# Patient Record
Sex: Male | Born: 1980 | Race: White | Hispanic: No | Marital: Single | Smoking: Never smoker
Health system: Southern US, Community
[De-identification: ages and names within clinical notes are randomized; demographics above are authoritative.]

## PROBLEM LIST (undated history)

## (undated) DIAGNOSIS — N319 Neuromuscular dysfunction of bladder, unspecified: Secondary | ICD-10-CM

## (undated) DIAGNOSIS — Z932 Ileostomy status: Secondary | ICD-10-CM

## (undated) DIAGNOSIS — G4730 Sleep apnea, unspecified: Secondary | ICD-10-CM

## (undated) DIAGNOSIS — G43909 Migraine, unspecified, not intractable, without status migrainosus: Secondary | ICD-10-CM

## (undated) DIAGNOSIS — K592 Neurogenic bowel, not elsewhere classified: Secondary | ICD-10-CM

## (undated) DIAGNOSIS — Z87898 Personal history of other specified conditions: Secondary | ICD-10-CM

## (undated) DIAGNOSIS — I10 Essential (primary) hypertension: Secondary | ICD-10-CM

## (undated) DIAGNOSIS — Z993 Dependence on wheelchair: Secondary | ICD-10-CM

## (undated) DIAGNOSIS — Q0700 Arnold-Chiari syndrome without spina bifida or hydrocephalus: Secondary | ICD-10-CM

## (undated) DIAGNOSIS — K219 Gastro-esophageal reflux disease without esophagitis: Secondary | ICD-10-CM

## (undated) DIAGNOSIS — Z973 Presence of spectacles and contact lenses: Secondary | ICD-10-CM

## (undated) DIAGNOSIS — Z982 Presence of cerebrospinal fluid drainage device: Secondary | ICD-10-CM

## (undated) DIAGNOSIS — Q052 Lumbar spina bifida with hydrocephalus: Secondary | ICD-10-CM

## (undated) DIAGNOSIS — R002 Palpitations: Secondary | ICD-10-CM

## (undated) DIAGNOSIS — R7303 Prediabetes: Secondary | ICD-10-CM

## (undated) DIAGNOSIS — G808 Other cerebral palsy: Secondary | ICD-10-CM

## (undated) DIAGNOSIS — Z87442 Personal history of urinary calculi: Secondary | ICD-10-CM

## (undated) DIAGNOSIS — N3945 Continuous leakage: Secondary | ICD-10-CM

## (undated) HISTORY — PX: OTHER SURGICAL HISTORY: SHX169

## (undated) HISTORY — PX: ORCHIOPEXY: SUR915

## (undated) HISTORY — PX: HIP SURGERY: SHX245

## (undated) HISTORY — PX: UMBILICAL HERNIA REPAIR: SHX196

## (undated) HISTORY — PX: VENTRICULOPERITONEAL SHUNT: SHX204

## (undated) HISTORY — PX: BACK SURGERY: SHX140

---
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
## 2006-02-28 ENCOUNTER — Encounter: Admission: RE | Admit: 2006-02-28 | Discharge: 2006-03-01 | Payer: Self-pay | Admitting: Family Medicine

## 2006-04-14 ENCOUNTER — Observation Stay (HOSPITAL_COMMUNITY): Admission: EM | Admit: 2006-04-14 | Discharge: 2006-04-15 | Payer: Self-pay | Admitting: Emergency Medicine

## 2006-11-24 ENCOUNTER — Inpatient Hospital Stay (HOSPITAL_COMMUNITY): Admission: EM | Admit: 2006-11-24 | Discharge: 2006-11-28 | Payer: Self-pay | Admitting: Emergency Medicine

## 2007-03-30 ENCOUNTER — Ambulatory Visit (HOSPITAL_COMMUNITY): Admission: RE | Admit: 2007-03-30 | Discharge: 2007-03-31 | Payer: Self-pay | Admitting: General Surgery

## 2007-03-30 ENCOUNTER — Encounter (INDEPENDENT_AMBULATORY_CARE_PROVIDER_SITE_OTHER): Payer: Self-pay | Admitting: General Surgery

## 2008-02-19 ENCOUNTER — Emergency Department (HOSPITAL_COMMUNITY): Admission: EM | Admit: 2008-02-19 | Discharge: 2008-02-19 | Payer: Self-pay | Admitting: Emergency Medicine

## 2008-12-11 ENCOUNTER — Encounter: Admission: RE | Admit: 2008-12-11 | Discharge: 2008-12-11 | Payer: Self-pay | Admitting: Family Medicine

## 2011-01-25 NOTE — H&P (Signed)
NAME:  [name], [name] NO.:  [id number]   MEDICAL RECORD NO.:  [id number]          PATIENT TYPE:  OIB   LOCATION:  1524                         FACILITY:  [facility]   PHYSICIAN:  Adolph Pollack, M.D.DATE OF BIRTH:  27-May-1981   DATE OF ADMISSION:  03/30/2007  DATE OF DISCHARGE:                              HISTORY & PHYSICAL   REASON:  Repair umbilical hernia.   HISTORY OF PRESENT ILLNESS:  Mr. Featherston is a 30 year old male with  spina bifida who was in the hospital with what was felt to be a bowel  obstruction versus ileus and a urinary tract infection back in March.  He was noted to have umbilical hernia at that time, some of it  containing intestine, but this was nonobstructive.  He has recovered and  now presents for umbilical hernia repair with mesh.  We discussed the  procedure and risks preoperatively.   PAST MEDICAL HISTORY:  1. Spina bifida.  2. Urinary tract infections.  3. Intermittent hypertension.  4. Chronic diarrhea.  5. Hydrocephalus.   PREVIOUS OPERATIONS.:  1. Ventriculoperitoneal shunt.  2. Orchiopexy.  3. ORIF right leg  4. Rods placed in the back.   ALLERGIES:  LATEX, VANCOMYCIN, CIPROFLOXACIN, MORPHINE   MEDICATIONS:  1. Glycerine suppositories p.r.n.  2. Diovan/HCT 80/12.5 daily.  3. Prilosec 40 mg daily.  4. Imodium p.r.n.  5. Vitamins.  6. Clotrimazole   SOCIAL HISTORY:  Lives with his mother.   REVIEW OF SYSTEMS:  Noted for urinary incontinence.   PHYSICAL EXAMINATION:  GENERAL:  Overweight male in no acute distress,  pleasant and cooperative.  NECK:  Supple without masses.  RESPIRATORY:  Breath sounds equal and clear.  CARDIOVASCULAR:  Heart demonstrates a regular rate and rhythm on exam.  ABDOMEN:  Soft, obese with a supraumbilical protrusion that is reducible  and a palpable fascial defect approximately 3 cm.  Active bowel sounds  heard.  EXTREMITIES:  Demonstrate lower muscular extremity wasting and  shortening.   IMPRESSION:  Umbilical hernia containing small intestine,  nonobstructive.   PLAN:  Umbilical hernia repair with mesh.      Adolph Pollack, M.D.  Electronically Signed     TJR/MEDQ  D:  03/30/2007  T:  03/30/2007  Job:  [id number]

## 2011-01-25 NOTE — Op Note (Signed)
NAME:  [name], [name] NO.:  [id number]   MEDICAL RECORD NO.:  [id number]          PATIENT TYPE:  OIB   LOCATION:  1524                         FACILITY:  [facility]   PHYSICIAN:  Adolph Pollack, M.D.DATE OF BIRTH:  1980-11-05   DATE OF PROCEDURE:  03/30/2007  DATE OF DISCHARGE:                               OPERATIVE REPORT   PREOPERATIVE DIAGNOSIS:  Umbilical hernia.   POSTOPERATIVE DIAGNOSIS:  Umbilical hernia.   PROCEDURE:  Umbilical hernia repair with mesh.   SURGEON:  Adolph Pollack, M.D.   ANESTHESIA:  General plus Marcaine for local.   INDICATIONS:  This 30 year old male has an umbilical hernia containing  small bowel.  He now presents for repair.   TECHNIQUE:  He was brought to the operating room, and placed supine on  the operating table; and general anesthetic was administered.  Foley  catheter was placed in his bladder as he has urinary incontinence  secondary to his spina bifida.  Urine was a little cloudy so a UA and  culture was sent.  The abdominal wall was sterilely prepped and draped.   The hernia was reduced.  A curvilinear subumbilical incision was made  through the skin and subcutaneous tissue.  I then carefully encircled  the umbilicus; and then dissected it free from the fascial wall.  I  entered the peritoneal sac which was thickened, and excised part of  this.  Small bowel was noted in the sac, but was not injured.  I  subsequently reclosed the peritoneum; and pressed it back  intraperitoneally.  I used electrocautery to identify fascia by  dissecting subcutaneous tissue off 4 cm around the defect.  I then  primarily closed the defect in a longitudinal fashion with interrupted  #1 Novofil sutures.  I brought a piece of polypropylene mesh into the  field; and threaded the sutures through the mesh and anchored the mesh  directly over the repair by tying down the same primary closure sutures.   I then trimmed the mesh to allow for  4 cm overlap in all directions; and  anchored it to the fascia with a running #0 Prolene suture.  I trimmed  excess mesh.  I then anesthetized the fascia and subcutaneous tissue  with Marcaine solution.  I irrigated the wound and hemostasis was  adequate.  Needle, sponge and instrument counts were reported to be  correct.   I then implanted the umbilicus on the mesh with 3-0 Vicryl suture.  The  subcutaneous tissue was closed over the mesh with a running 3-0 Vicryl  suture.  The skin was then closed with a running 4-0 Monocryl  subcuticular stitch.  Steri-Strips and sterile dressings were applied.   He tolerated the procedure without any apparent complications; and was  taken to recovery in satisfactory condition.      Adolph Pollack, M.D.  Electronically Signed     TJR/MEDQ  D:  03/30/2007  T:  03/30/2007  Job:  [id number]

## 2011-01-28 NOTE — Consult Note (Signed)
NAME:  Zachary, [name] NO.:  [id number]   MEDICAL RECORD NO.:  [id number]          PATIENT TYPE:  INP   LOCATION:  1419                         FACILITY:  [facility]   PHYSICIAN:  Adolph Pollack, M.D.DATE OF BIRTH:  10-30-1980   DATE OF CONSULTATION:  11/24/2006  DATE OF DISCHARGE:                                 CONSULTATION   REASON FOR CONSULTATION:  Small-bowel obstruction.   HISTORY OF PRESENT ILLNESS:  This is a 30 year old with congenital spina  bifida who states a couple of days ago he had some nausea and vomiting  followed by abdominal pain and diarrhea.  He was able to tolerate some  fluids, continued to have discomfort, and presented to the emergency  department.  At that time he was evaluated and noted to have findings  consistent with a urinary tract infection as well as a  CT scan  suggesting ileus versus obstruction.  He had abdominal films 8 hours  after the CT scan which showed contrast had traversed into the colon.  We were subsequently asked to see him for ileus versus bowel  obstruction.   PAST MEDICAL HISTORY:  1. Congenital spina bifida.  2. Urinary tract infection.  3. Cholelithiasis.  4. Hypertension.  5. Hydrocephalus.  6. Arnold-Chiari malformation.   PREVIOUS OPERATIONS:  1. Placement of rods in the back.  2. Ventriculoperitoneal shunt.  3. VP shunt revision.  4. Bilateral orchiopexy.   ALLERGIES:  IV CIPRO, VANCOMYCIN, MORPHINE.   HOME MEDICATIONS:  Include Colace, Prilosec, antihypertensive.   SOCIAL HISTORY:  No tobacco or alcohol use.  Lives with his mother.   REVIEW OF SYSTEMS:  CARDIOVASCULAR:  No heart disease.  PULMONARY:  No  asthma.  GI:  Occasional constipation.   PHYSICAL EXAMINATION:  GENERAL:  An obese male who is in no acute  distress.  VITAL SIGNS:  He is afebrile, and vital signs are normal.  LUNGS:  Clear to auscultation.  ABDOMEN:  Soft, nontender, somewhat obese.  There is an umbilical hernia  that  is easily reducible.  Active bowel sounds are noted.  No masses.   CT scan is reviewed.  It demonstrates some proximal small-bowel  dilatation with intermittent segments of abdominal wall thickening and  mesenteric edema.  There is an umbilical hernia with nonrestrictive  small bowel in it and a small amount of free fluid present.   Abdominal x-rays 8 hours after CT demonstrated contrast in the colon  with some dilated small-bowel loops.   IMPRESSION:  1. This appears to be a picture of a gastroenteritis with possible      mild ileus versus partial small-bowel obstruction.  However, he      currently is passing gas and having some diarrhea while here in the      hospital.  He also has contrast which rapidly traversed the small      bowel and went into the colon, ruling against significant small-      bowel obstruction.  2. Urinary tract infection.  3. Hypokalemia.   RECOMMENDATIONS:  1. I would correct potassium aggressively.  2. Check  abdominal films.  Currently I see no indication for operative      intervention at this time.      Adolph Pollack, M.D.  Electronically Signed     TJR/MEDQ  D:  11/25/2006  T:  11/26/2006  Job:  [id number]

## 2011-01-28 NOTE — H&P (Signed)
NAME:  [name], [name] NO.:  [id number]   MEDICAL RECORD NO.:  [id number]          PATIENT TYPE:  EMS   LOCATION:  ED                           FACILITY:  [facility]   PHYSICIAN:  Hettie Holstein, D.O.    DATE OF BIRTH:  [birth year]   DATE OF ADMISSION:  11/23/2006  DATE OF DISCHARGE:                              HISTORY & PHYSICAL   PRIMARY CARE PHYSICIAN:  Primary care physician is Renaye Rakers, M.D.   CHIEF COMPLAINT:  Stomach pain.   HISTORY OF PRESENT ILLNESS:  [name] is a pleasant 30 year old male  with a known history of spina bifida who is essentially wheelchair  bound, who had been in his usual state of health up until Tuesday when  he began to develop abdominal distension and crampy pain associated with  nausea, vomiting in addition to diarrhea.  His mother describes a low  grade temperature and he was taken to the emergency department and  underwent imaging studies.  He states that he has been able to tolerate  fluids and is not in excruciating pain at this time.  His CT scan in the  department revealed markedly dilated jejunum with several discrete areas  of abnormal jejunal wall thickening and mesenteric vascular engorgement.  The contrast was noted to have migrated distal to the abnormal segments  and distal to the abnormal segments and the dilated bowel was scattered  between these segments.  There was an umbilical hernia containing small  bowel which did not appear to be obstructing.  The contrast migrated  into the nondilated distal loops of small bowel.  Dr. Molli Posey  recommended serial follow up to resolution.  Cholesterol gallstones were  as well noted.   PAST MEDICAL HISTORY:  Significant for spina bifida with surgical  correction of spinal leak, status post insertion of a ventricular shunt.  History of Arnold-Chiari malformation, history of crypt orchidism,status  post orchiopexy.   MEDICATIONS:  He takes Valsartan/HCTZ 80/12.5 daily,  omeprazole 40 mg  daily.   ALLERGIES:  His mother states that he developed a reaction to [facility]  and a questionable reaction to CIPRO though he appears to be tolerating  this p.o. without difficulty today.   SURGICAL HISTORY:  As noted above orchiopexy as well as ventricular  shunt placement.   SOCIAL HISTORY:  [name] is currently living with his mom.  He  works at [employer].  His mother can be reached at [phone].   FAMILY HISTORY:  He has one sister, his father died in his 74's with  multiple medical issues, not further expanded upon by the family.  Mother is alive and well. [name] does not smoke or drink alcohol.   REVIEW OF SYSTEMS:  [name] has been in his usual state of health  without difficulties until this past Tuesday.  He formerly had required  in and out catheterizations, however he has not required this.  He uses  an adult diaper.  There is no skin breakdown according to the family and  he has not had gastrointestinal problems up until  this past week where  he has developed some diarrhea, nausea and vomiting.   PHYSICAL EXAMINATION:  VITAL SIGNS:  Physical examination in the  department his blood pressure was 140/86, temperature 97.7, heart rate  89, respirations 20, O2 saturations 99%.  GENERAL:  The patient does not appear to be toxic.  HEENT:  Reveals head to be normocephalic.  NECK:  Supple, nontender.  ABDOMEN:  Is distended, obese and tympanitic without rigidity or  rebound.  He does have a reducible small umbilical hernia.  GENITOURINARY:  Genitalia are normally developed.  EXTREMITIES:  His lower extremities are nontender and reveal no edema.  He lacks sensation and movement.   LABORATORY DATA:  His urinalysis revealed 21-50 WBC's.  Sodium 136,  potassium 3.4.  BUN 7, creatinine 0.52, glucose 98, AST and ALT 24 and  43, albumin 36. WBC of 12.7, hemoglobin of 15.1, platelet count of  295,000 and MCV of 79.  CT as  reported above revealed markedly dilated  jejunum.   ASSESSMENT:  1. Ileus versus obstruction.  2. Diarrhea/gastroenteritis.  3. History of spina bifida.  4. Hypertension.  5. Obesity.  6. History of cryptorchidism, status post orchiopexy.  7. Hypokalemia.  8. Leukocytosis secondary to urinary tract infection/pyelonephritis.  9. Cholesterol gallstones.   PLAN:  The plan at this time is to obtain cultures, empirically treat  [name] urinary tract infection. We are going to place him on  bowel rest and observe a follow-up radiograph in the morning.  We will  continue IV fluids and electrolyte replacement and hold his  antihypertensive and diuretic medications at this time, to resume once  he is clinically stabilized.      Hettie Holstein, D.O.  Electronically Signed     ESS/MEDQ  D:  11/24/2006  T:  11/25/2006  Job:  [id number]   cc:   Renaye Rakers, M.D.  Fax: [phone]

## 2011-01-28 NOTE — Discharge Summary (Signed)
NAME:  DEVONTAY, CELAYA NO.:  [id number]   MEDICAL RECORD NO.:  [id number]          PATIENT TYPE:  INP   LOCATION:  1419                         FACILITY:  [facility]   PHYSICIAN:  Marcellus Scott, MD     DATE OF BIRTH:  Feb 03, 1981   DATE OF ADMISSION:  11/23/2006  DATE OF DISCHARGE:  11/28/2006                               DISCHARGE SUMMARY   PRIMARY CARE PHYSICIAN:  Renaye Rakers, M.D.   DISCHARGE DIAGNOSES:  1. Partial small bowel obstruction versus ileus.  2. Escherichia coli urinary tract infection.  3. Hypokalemia.  4. Leukocytosis.   SECONDARY DIAGNOSES:  1. Spina bifida, status post ventricular shunt.  2. Hypertension.   DISCHARGE MEDICATIONS:  1. Ciprofloxacin 500 mg p.o. b.i.d. for another two days.  2. Prilosec OTC 1 p.o. daily.  3. Colace 1 p.o. twice daily.  4. Patient's home medication of Valsartan/hydrochlorothiazide 80/12.5      mg daily has been held.  This has to be restarted on review with      the primary medical doctor once the blood pressure comes up.   PROCEDURE:  1. CT of the abdomen with contrast on November 24, 2006. Please see      report for details.  2. CT of pelvis with contrast on November 24, 2006.  3. This patient had multiple x-rays of the abdomen in the followup of      his small bowel obstruction/ileus.  4. Chest x-ray 1. No definitive gross pulmonary findings. Marked      distortion of the chest. 2. Dilated bowel. Cannot rule out small      bowl obstruction   LAB DATA:  CBC with a hemoglobin of 13.9, hematocrit 41.4, white cells  9.2, platelets 236.  Basic metabolic panel:  Sodium 135, potassium 3.6,  chloride 106, bicarb 24, glucose 135, BUN 2, creatinine 0.44, magnesium  1.7, calcium 8.7.  Urinalysis with large amount of blood, nitrites  positive, leukocytes moderate, many bacteria.  Urine culture positive  for E. coli, pan sensitive.   CONSULTATIONS:  [facility], Dr. Abbey Chatters.   HOSPITAL COURSE/PATIENT  DISPOSITION:  For details of the initial part of  the admission, please refer to the history and physical note dictated by  Dr. Angelena Sole on November 23, 2006.  In summary, Mr. Maffeo is a 30 year old  male patient with an extensive past medical history, as indicated in the  history and physical note. He presented with a history of abdominal  pain, abdominal distention, nausea, vomiting, and diarrhea.  On further  evaluation, he was noted to have ileus versus small bowel obstruction.  The patient was admitted for further evaluation and management.   1. Partial small bowel obstruction versus ileus:  Patient was admitted      to the hospital and was placed n.p.o.  A nasogastric tube was      passed and connected to intermittent wall suction.  The patient was      placed on IV fluids and pain medications.  A surgical consult was      obtained, who followed him over the course  of the hospital stay.      Once the patient's bowel sounds picked up, and his NG drainiage      decreased, his NG tube was discontinued.  The patient was followed      clinically as well as radiologically.  The patient was started on a      clear-liquid diet, which has subsequently been graduated and his      bowel sounds have normalized.  He has had good BMs since then,      tolerating p.o. feeds and is asymptomatic.  The patient has a tiny,      reducible umbilical hernia, which the surgeons indicate at some      point has to be operated on.  They will follow up him in their      office.  An appointment is to be made.  Patient has been advised to      seek immediate attention in the case of any recurrence of symptoms.   1. E. coli urinary tract infection:  Patient was placed on      ciprofloxacin, which he has tolerated.  He is to complete a total      of one week's course of antibiotics.   1. Hypokalemia, which has been completed and corrected.   1. Leukocytosis secondary to stress as well as urinary tract       infection:  Again, has resolved.   1. Hypertension:  Patient's Valsartan and hydrochlorothiazide were      held secondary to his dehydration.  These are to be resumed as an      outpatient once the patient is feeding adequately, and his blood      pressures are elevated.  Currently, the blood pressures are well      controlled off medications.      Marcellus Scott, MD  Electronically Signed     AH/MEDQ  D:  11/28/2006  T:  11/28/2006  Job:  [id number]   cc:   Renaye Rakers, M.D.  Fax: [phone]   Adolph Pollack, M.D.  [address]  [place]  [place] [address]

## 2011-06-27 LAB — CBC
Hemoglobin: 15.4
RBC: 5.84 — ABNORMAL HIGH
RDW: 13.4
WBC: 12.6 — ABNORMAL HIGH

## 2011-06-27 LAB — DIFFERENTIAL
Basophils Relative: 0
Eosinophils Absolute: 0.1
Monocytes Absolute: 0.7
Monocytes Relative: 5
Neutrophils Relative %: 80 — ABNORMAL HIGH

## 2011-06-27 LAB — URINE CULTURE
Colony Count: >=100000
Special Requests: POSITIVE

## 2011-06-27 LAB — URINALYSIS, ROUTINE W REFLEX MICROSCOPIC
Hgb urine dipstick: NEGATIVE
Protein, ur: NEGATIVE
Urobilinogen, UA: 0.2

## 2011-06-27 LAB — COMPREHENSIVE METABOLIC PANEL
ALT: 40
Alkaline Phosphatase: 85
CO2: 29
Chloride: 100
Glucose, Bld: 121 — ABNORMAL HIGH
Potassium: 4
Sodium: 138
Total Protein: 7.6

## 2012-09-12 DIAGNOSIS — Z932 Ileostomy status: Secondary | ICD-10-CM

## 2012-09-12 HISTORY — DX: Ileostomy status: Z93.2

## 2012-12-25 ENCOUNTER — Ambulatory Visit: Payer: Medicare Other | Admitting: Physical Therapy

## 2012-12-27 ENCOUNTER — Ambulatory Visit: Payer: Medicare Other | Admitting: Rehabilitative and Restorative Service Providers"

## 2013-02-07 ENCOUNTER — Ambulatory Visit: Payer: PRIVATE HEALTH INSURANCE | Attending: Family Medicine | Admitting: Physical Therapy

## 2013-02-07 DIAGNOSIS — IMO0001 Reserved for inherently not codable concepts without codable children: Secondary | ICD-10-CM | POA: Insufficient documentation

## 2013-02-07 DIAGNOSIS — R293 Abnormal posture: Secondary | ICD-10-CM | POA: Insufficient documentation

## 2013-02-07 DIAGNOSIS — Z993 Dependence on wheelchair: Secondary | ICD-10-CM | POA: Insufficient documentation

## 2013-07-05 ENCOUNTER — Emergency Department (HOSPITAL_COMMUNITY): Payer: PRIVATE HEALTH INSURANCE

## 2013-07-05 ENCOUNTER — Inpatient Hospital Stay (HOSPITAL_COMMUNITY)
Admission: EM | Admit: 2013-07-05 | Discharge: 2013-07-09 | DRG: 853 | Disposition: A | Discharge status: Other Acute Inpatient Hospital | Payer: PRIVATE HEALTH INSURANCE | Attending: General Surgery | Admitting: General Surgery

## 2013-07-05 ENCOUNTER — Encounter (HOSPITAL_COMMUNITY): Payer: Self-pay | Admitting: Emergency Medicine

## 2013-07-05 DIAGNOSIS — N319 Neuromuscular dysfunction of bladder, unspecified: Secondary | ICD-10-CM | POA: Diagnosis present

## 2013-07-05 DIAGNOSIS — K8020 Calculus of gallbladder without cholecystitis without obstruction: Secondary | ICD-10-CM

## 2013-07-05 DIAGNOSIS — Z91040 Latex allergy status: Secondary | ICD-10-CM

## 2013-07-05 DIAGNOSIS — J9600 Acute respiratory failure, unspecified whether with hypoxia or hypercapnia: Secondary | ICD-10-CM | POA: Diagnosis not present

## 2013-07-05 DIAGNOSIS — K822 Perforation of gallbladder: Secondary | ICD-10-CM | POA: Diagnosis not present

## 2013-07-05 DIAGNOSIS — E872 Acidosis, unspecified: Secondary | ICD-10-CM | POA: Diagnosis present

## 2013-07-05 DIAGNOSIS — R000 Tachycardia, unspecified: Secondary | ICD-10-CM | POA: Diagnosis present

## 2013-07-05 DIAGNOSIS — R1011 Right upper quadrant pain: Secondary | ICD-10-CM

## 2013-07-05 DIAGNOSIS — R7989 Other specified abnormal findings of blood chemistry: Secondary | ICD-10-CM | POA: Diagnosis present

## 2013-07-05 DIAGNOSIS — K429 Umbilical hernia without obstruction or gangrene: Secondary | ICD-10-CM | POA: Diagnosis present

## 2013-07-05 DIAGNOSIS — G8220 Paraplegia, unspecified: Secondary | ICD-10-CM | POA: Diagnosis present

## 2013-07-05 DIAGNOSIS — E871 Hypo-osmolality and hyponatremia: Secondary | ICD-10-CM | POA: Diagnosis not present

## 2013-07-05 DIAGNOSIS — K810 Acute cholecystitis: Secondary | ICD-10-CM | POA: Diagnosis present

## 2013-07-05 DIAGNOSIS — Q059 Spina bifida, unspecified: Secondary | ICD-10-CM

## 2013-07-05 DIAGNOSIS — K631 Perforation of intestine (nontraumatic): Secondary | ICD-10-CM | POA: Diagnosis not present

## 2013-07-05 DIAGNOSIS — K659 Peritonitis, unspecified: Secondary | ICD-10-CM | POA: Diagnosis present

## 2013-07-05 DIAGNOSIS — Z982 Presence of cerebrospinal fluid drainage device: Secondary | ICD-10-CM

## 2013-07-05 DIAGNOSIS — K8000 Calculus of gallbladder with acute cholecystitis without obstruction: Secondary | ICD-10-CM | POA: Diagnosis present

## 2013-07-05 DIAGNOSIS — A419 Sepsis, unspecified organism: Principal | ICD-10-CM | POA: Diagnosis not present

## 2013-07-05 DIAGNOSIS — Z6841 Body Mass Index (BMI) 40.0 and over, adult: Secondary | ICD-10-CM

## 2013-07-05 DIAGNOSIS — N390 Urinary tract infection, site not specified: Secondary | ICD-10-CM | POA: Diagnosis present

## 2013-07-05 DIAGNOSIS — R0682 Tachypnea, not elsewhere classified: Secondary | ICD-10-CM | POA: Diagnosis not present

## 2013-07-05 DIAGNOSIS — I10 Essential (primary) hypertension: Secondary | ICD-10-CM | POA: Diagnosis present

## 2013-07-05 HISTORY — DX: Essential (primary) hypertension: I10

## 2013-07-05 LAB — CBC WITH DIFFERENTIAL/PLATELET
Basophils Absolute: 0 10*3/uL (ref 0.0–0.1)
HCT: 44.3 % (ref 39.0–52.0)
Hemoglobin: 14.7 g/dL (ref 13.0–17.0)
Lymphocytes Relative: 13 % (ref 12–46)
Monocytes Absolute: 1 10*3/uL (ref 0.1–1.0)
Monocytes Relative: 9 % (ref 3–12)
Neutro Abs: 8.8 10*3/uL — ABNORMAL HIGH (ref 1.7–7.7)
Neutrophils Relative %: 78 % — ABNORMAL HIGH (ref 43–77)
RDW: 13 % (ref 11.5–15.5)
WBC: 11.2 10*3/uL — ABNORMAL HIGH (ref 4.0–10.5)

## 2013-07-05 LAB — COMPREHENSIVE METABOLIC PANEL
ALT: 381 U/L — ABNORMAL HIGH (ref 0–53)
AST: 454 U/L — ABNORMAL HIGH (ref 0–37)
Alkaline Phosphatase: 125 U/L — ABNORMAL HIGH (ref 39–117)
CO2: 25 mEq/L (ref 19–32)
Chloride: 103 mEq/L (ref 96–112)
Creatinine, Ser: 0.39 mg/dL — ABNORMAL LOW (ref 0.50–1.35)
GFR calc non Af Amer: >90 mL/min (ref >90)
Potassium: 3.9 mEq/L (ref 3.5–5.1)
Total Bilirubin: 1.2 mg/dL (ref 0.3–1.2)

## 2013-07-05 LAB — URINE MICROSCOPIC-ADD ON

## 2013-07-05 LAB — URINALYSIS, ROUTINE W REFLEX MICROSCOPIC
Ketones, ur: NEGATIVE mg/dL
Nitrite: POSITIVE — AB
Protein, ur: 100 mg/dL — AB

## 2013-07-05 MED ORDER — HYDROCODONE-ACETAMINOPHEN 5-325 MG PO TABS: 1.0000 | ORAL_TABLET | ORAL | Status: DC | PRN

## 2013-07-05 MED ORDER — PIPERACILLIN-TAZOBACTAM 3.375 G IVPB
3.3750 g | Freq: Three times a day (TID) | INTRAVENOUS | Status: DC
  Administered 2013-07-05 – 2013-07-09 (×12): 3.375 g via INTRAVENOUS
  Filled 2013-07-05 (×14): qty 50

## 2013-07-05 MED ORDER — MORPHINE SULFATE 2 MG/ML IJ SOLN
1.0000 mg | INTRAMUSCULAR | Status: DC | PRN
  Administered 2013-07-05 – 2013-07-06 (×3): 2 mg via INTRAVENOUS
  Administered 2013-07-06 – 2013-07-07 (×3): 4 mg via INTRAVENOUS
  Administered 2013-07-08: 2 mg via INTRAVENOUS
  Administered 2013-07-08: 4 mg via INTRAVENOUS
  Administered 2013-07-08: 2 mg via INTRAVENOUS
  Filled 2013-07-05: qty 2
  Filled 2013-07-05 (×2): qty 1
  Filled 2013-07-05: qty 2
  Filled 2013-07-05 (×2): qty 1
  Filled 2013-07-05 (×2): qty 2
  Filled 2013-07-05: qty 1

## 2013-07-05 MED ORDER — DIPHENHYDRAMINE HCL 12.5 MG/5ML PO ELIX: 12.5000 mg | ORAL_SOLUTION | Freq: Four times a day (QID) | ORAL | Status: DC | PRN

## 2013-07-05 MED ORDER — ONDANSETRON HCL 4 MG/2ML IJ SOLN
4.0000 mg | Freq: Four times a day (QID) | INTRAMUSCULAR | Status: DC | PRN
  Administered 2013-07-06: 4 mg via INTRAVENOUS

## 2013-07-05 MED ORDER — KETOROLAC TROMETHAMINE 30 MG/ML IJ SOLN
30.0000 mg | Freq: Once | INTRAMUSCULAR | Status: AC
  Administered 2013-07-05: 30 mg via INTRAVENOUS
  Filled 2013-07-05: qty 1

## 2013-07-05 MED ORDER — IRBESARTAN 75 MG PO TABS
75.0000 mg | ORAL_TABLET | Freq: Every day | ORAL | Status: DC
  Administered 2013-07-05 – 2013-07-07 (×3): 75 mg via ORAL
  Filled 2013-07-05 (×4): qty 1

## 2013-07-05 MED ORDER — VALSARTAN-HYDROCHLOROTHIAZIDE 80-12.5 MG PO TABS: 1.0000 | ORAL_TABLET | Freq: Every day | ORAL | Status: DC

## 2013-07-05 MED ORDER — DIPHENHYDRAMINE HCL 50 MG/ML IJ SOLN
12.5000 mg | Freq: Four times a day (QID) | INTRAMUSCULAR | Status: DC | PRN
  Administered 2013-07-05: 12.5 mg via INTRAVENOUS
  Filled 2013-07-05: qty 1

## 2013-07-05 MED ORDER — ONDANSETRON HCL 4 MG/2ML IJ SOLN
4.0000 mg | Freq: Once | INTRAMUSCULAR | Status: AC
  Administered 2013-07-05: 4 mg via INTRAVENOUS
  Filled 2013-07-05: qty 2

## 2013-07-05 MED ORDER — INFLUENZA VAC SPLIT QUAD 0.5 ML IM SUSP
0.5000 mL | INTRAMUSCULAR | Status: DC
  Filled 2013-07-05 (×2): qty 0.5

## 2013-07-05 MED ORDER — METHOCARBAMOL 100 MG/ML IJ SOLN
1000.0000 mg | Freq: Once | INTRAMUSCULAR | Status: AC
  Administered 2013-07-05: 1000 mg via INTRAVENOUS
  Filled 2013-07-05: qty 10

## 2013-07-05 MED ORDER — MORPHINE SULFATE 4 MG/ML IJ SOLN
4.0000 mg | INTRAMUSCULAR | Status: DC | PRN
  Administered 2013-07-05: 4 mg via INTRAVENOUS
  Filled 2013-07-05: qty 1

## 2013-07-05 MED ORDER — HYDROCHLOROTHIAZIDE 12.5 MG PO CAPS
12.5000 mg | ORAL_CAPSULE | Freq: Every day | ORAL | Status: DC
  Administered 2013-07-05 – 2013-07-07 (×3): 12.5 mg via ORAL
  Filled 2013-07-05 (×4): qty 1

## 2013-07-05 MED ORDER — PANTOPRAZOLE SODIUM 40 MG PO TBEC
40.0000 mg | DELAYED_RELEASE_TABLET | Freq: Every day | ORAL | Status: DC
  Administered 2013-07-06 – 2013-07-07 (×2): 40 mg via ORAL
  Filled 2013-07-05 (×3): qty 1

## 2013-07-05 MED ORDER — NITROFURANTOIN MONOHYD MACRO 100 MG PO CAPS: 100.0000 mg | ORAL_CAPSULE | Freq: Two times a day (BID) | ORAL | Status: DC

## 2013-07-05 MED ORDER — KCL IN DEXTROSE-NACL 20-5-0.45 MEQ/L-%-% IV SOLN
INTRAVENOUS | Status: DC
  Administered 2013-07-05 – 2013-07-08 (×6): via INTRAVENOUS
  Filled 2013-07-05 (×13): qty 1000

## 2013-07-05 NOTE — H&P (Signed)
Dywane Peruski [name] 01-11-1981  [id number].   Chief Complaint/Reason for Consult: abdominal pain HPI: This is a 32 yo with a history of spina bifida who has a VP shunt (paraplegic) and chronic UTIs who began having new back pain yesterday.  He normally has some back pain given all of his back surgeries, but this pain is different.  He does not associate it with anything in specific or his diet.  He admits to pain in the RUQ and under his ribs bilaterally.  He denies nausea, vomiting, or diarrhea.  No fevers or chills.  Due to continued pain, his mom brought him to [facility].  He had a CT scan that revealed gallbladder wall thickening and pericholecystic fluid c/w cholecystitis.  He also has gallstones.  We have been asked to see the patient for further evaluation.  ROS: Please see HPI, otherwise all other systems have been reviewed and are negative.  Negative for hematuria.  Patient no longer self-catheterizes himself.  He just wears diapers and is incontinent.  History reviewed. No pertinent family history.  Past Medical History  Diagnosis Date  . Spina bifida   . Hypertension     Past Surgical History  Procedure Laterality Date  . Back surgery      x2  . Hernia repair      umbilical hernia  . Ventriculoperitoneal shunt      revised 19yrs ago  . Hip surgery      Social History:  reports that he has never smoked. He does not have any smokeless tobacco history on file. He reports that he does not drink alcohol or use illicit drugs.  Allergies:  Allergies  Allergen Reactions  . Ciprofloxacin     IV only caused burning in arm   . Latex Hives  . Vancomycin Itching and Swelling     (Not in a hospital admission)  Blood pressure 126/79, pulse 87, temperature 98.7 F (37.1 C), temperature source Oral, resp. rate 20, SpO2 98.00%. Physical Exam: General: pleasant, obese white male who is laying in bed in NAD HEENT: head is normocephalic, atraumatic.  Sclera are noninjected.  PERRL.  Ears  and nose without any masses or lesions.  Mouth is pink and moist Heart: regular, rate, and rhythm.  Normal s1,s2. No obvious murmurs, gallops, or rubs noted.  Palpable radial and pedal pulses bilaterally Lungs: CTAB, no wheezes, rhonchi, or rales noted.  Respiratory effort nonlabored Abd: soft, tender across upper abdomen, difficult to assess where he is most tender, ND, but rotund, +BS, no masses, hernias, or organomegaly MS: severe atrophy and external rotation of his lower extremities. No cyanosis, clubbing, or edema noted in any extremity Skin: warm and dry with no masses, lesions, or rashes Psych: A&Ox3 with an appropriate affect.    Results for orders placed during the hospital encounter of 07/05/13 (from the past 48 hour(s))  CBC WITH DIFFERENTIAL     Status: Abnormal   Collection Time    07/05/13  1:10 PM      Result Value Range   WBC 11.2 (*) 4.0 - 10.5 K/uL   RBC 5.54  4.22 - 5.81 MIL/uL   Hemoglobin 14.7  13.0 - 17.0 g/dL   HCT 62.1  30.8 - 65.7 %   MCV 80.0  78.0 - 100.0 fL   MCH 26.5  26.0 - 34.0 pg   MCHC 33.2  30.0 - 36.0 g/dL   RDW 84.6  96.2 - 95.2 %   Platelets 226  150 - 400  K/uL   Neutrophils Relative % 78 (*) 43 - 77 %   Neutro Abs 8.8 (*) 1.7 - 7.7 K/uL   Lymphocytes Relative 13  12 - 46 %   Lymphs Abs 1.4  0.7 - 4.0 K/uL   Monocytes Relative 9  3 - 12 %   Monocytes Absolute 1.0  0.1 - 1.0 K/uL   Eosinophils Relative 0  0 - 5 %   Eosinophils Absolute 0.0  0.0 - 0.7 K/uL   Basophils Relative 0  0 - 1 %   Basophils Absolute 0.0  0.0 - 0.1 K/uL  COMPREHENSIVE METABOLIC PANEL     Status: Abnormal   Collection Time    07/05/13  1:10 PM      Result Value Range   Sodium 138  135 - 145 mEq/L   Potassium 3.9  3.5 - 5.1 mEq/L   Chloride 103  96 - 112 mEq/L   CO2 25  19 - 32 mEq/L   Glucose, Bld 98  70 - 99 mg/dL   BUN 15  6 - 23 mg/dL   Creatinine, Ser 4.54 (*) 0.50 - 1.35 mg/dL   Calcium 9.1  8.4 - 09.8 mg/dL   Total Protein 7.4  6.0 - 8.3 g/dL   Albumin  3.7  3.5 - 5.2 g/dL   AST 119 (*) 0 - 37 U/L   ALT 381 (*) 0 - 53 U/L   Alkaline Phosphatase 125 (*) 39 - 117 U/L   Total Bilirubin 1.2  0.3 - 1.2 mg/dL   GFR calc non Af Amer >90  >90 mL/min   GFR calc Af Amer >90  >90 mL/min   Comment: (NOTE)     The eGFR has been calculated using the CKD EPI equation.     This calculation has not been validated in all clinical situations.     eGFR's persistently <90 mL/min signify possible Chronic Kidney     Disease.  LIPASE, BLOOD     Status: None   Collection Time    07/05/13  1:10 PM      Result Value Range   Lipase 16  11 - 59 U/L  URINALYSIS, ROUTINE W REFLEX MICROSCOPIC     Status: Abnormal   Collection Time    07/05/13  2:09 PM      Result Value Range   Color, Urine AMBER (*) YELLOW   Comment: BIOCHEMICALS MAY BE AFFECTED BY COLOR   APPearance TURBID (*) CLEAR   Specific Gravity, Urine 1.021  1.005 - 1.030   pH 8.0  5.0 - 8.0   Glucose, UA NEGATIVE  NEGATIVE mg/dL   Hgb urine dipstick SMALL (*) NEGATIVE   Bilirubin Urine NEGATIVE  NEGATIVE   Ketones, ur NEGATIVE  NEGATIVE mg/dL   Protein, ur 147 (*) NEGATIVE mg/dL   Urobilinogen, UA 1.0  0.0 - 1.0 mg/dL   Nitrite POSITIVE (*) NEGATIVE   Leukocytes, UA LARGE (*) NEGATIVE  URINE MICROSCOPIC-ADD ON     Status: Abnormal   Collection Time    07/05/13  2:09 PM      Result Value Range   Squamous Epithelial / LPF RARE  RARE   WBC, UA TOO NUMEROUS TO COUNT  <3 WBC/hpf   RBC / HPF 3-6  <3 RBC/hpf   Bacteria, UA MANY (*) RARE   Ct Abdomen Pelvis Wo Contrast  07/05/2013   CLINICAL DATA:  Back pain with hematuria. History of spina bifida and back surgery.  EXAM: CT ABDOMEN AND PELVIS WITHOUT  CONTRAST  TECHNIQUE: Multidetector CT imaging of the abdomen and pelvis was performed following the standard protocol without intravenous contrast.  COMPARISON:  Abdominal pelvic CT 11/23/2006.  FINDINGS: The visualized lung bases are clear. There is no significant pleural or pericardial effusion.  The  intra-abdominal anatomy is distorted by the spinal deformities. As evaluated in the noncontrast study, the liver, spleen and adrenal glands appear normal. The pancreas demonstrates fatty replacement but no change or acute abnormality.  There is progressive irregular thickening of the gallbladder walls with adjacent soft tissue stranding suspicious for cholecystitis. There is a small central calcified gallstone. No significantly dilatation is seen.  Both kidneys appear normal. There is no hydronephrosis or urinary tract calcification. The bladder is trabeculated and the thick-walled and protrudes anteriorly over the symphysis pubis, similar to the prior study. The central density of the bladder contents is increased compared with the prior examination. The prostate gland and seminal vesicles appear normal.  No acute bowel findings identified. There is probable chronic prolapse of the rectum and bladder base into the perineum. Ventriculoperitoneal shunt is noted without focal fluid collection adjacent to its tip in the right lower quadrant. The anterior abdominal wall is diffusely thinned without focal recurrent hernia.  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 Some heterotopic ossification posterior to the right femoral greater trochanter has mildly increased. No acute osseous findings are seen.  IMPRESSION: 1. Progressive gallbladder wall thickening and surrounding inflammatory change suspicious for chronic cholecystitis. Underlying gallstones are noted. 2. Increased density within the bladder lumen may be related to hematuria or urinary tract infection. The bladder appears thick-walled and trabeculated. A bladder mass cannot be excluded. There is no evidence of urinary tract calculus or hydronephrosis. 3. Chronic findings related to spina bifida and prior lumbar fusion.   Electronically Signed   By: Roxy Horseman M.D.   On: 07/05/2013 14:12   Dg Chest  Port 1 View  07/05/2013   CLINICAL DATA:  Back pain, history of spina bifida  EXAM: PORTABLE CHEST - 1 VIEW  COMPARISON:  Concurrently obtained CT scan of the abdomen and pelvis  FINDINGS: Borderline nondiagnostic chest radiograph secondary to severe kyphotic patient positioning and body habitus. There are very low inspiratory volumes. Incompletely imaged posterior spinal stabilization hardware and right-sided ventriculoperitoneal shunt catheter. Correlation with the concurrently obtained is CT scan of the abdomen and pelvis demonstrates no significant airspace disease in the lung bases which are the portion of the lung most limited on this radiograph. The upper and a peripheral aspect of the lungs appear clear.  IMPRESSION: Very limited chest x-ray secondary to patient body habitus and positioning. No definite acute cardiopulmonary process. New  The central and inferior aspect of the chest image is essentially non diagnostic. However, correlation with the concurrently obtained CT scan of the abdomen and pelvis including the lung bases demonstrates no acute abnormality in this region.   Electronically Signed   By: Malachy Moan M.D.   On: 07/05/2013 13:44       Assessment/Plan 1. Acute cholecystitis 2. VP shunt 3. UTI 4. 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 HTN  Plan: 1. We will get the patient admitted and plan for a lap chole.  I have discussed this patient with the on call Neurosurgeon who does not feel the patient needs to have his VP shunt exteriorized at this time.  We will start him on zosyn to cover his gallbladder and his UTI  as he is unable to have IV Cipro.   He will have clear liquids tonight and NPO p MN.  I have discussed all of this with the patient and his mother.  They understand and are agreeable to proceed.  Kalup Jaquith E 07/05/2013, 4:12 PM Pager: [phone]

## 2013-07-05 NOTE — ED Provider Notes (Addendum)
CSN: [id number]     Arrival date & time 07/05/13  1204 History   First MD Initiated Contact with Patient 07/05/13 1208     Chief Complaint  Patient presents with  . Chest Pain   ) HPI  32 year-old male with history of spina bifida. He, nor  his mom can delineate wherer his level of paraplegia is. He states "below my waist". History describing pain under both ribs and his back. States it has been worse since morning. He denies that he's had episodes like this in the past. However, his mother states is having some back pain and they did x-rays on him it showed that his Harrington rods, placed for correction of his kyphoscoliosis, had broken. She states this was "a few years ago". Has no nausea. No vomiting. He is incontinent of urine at baseline, but producing a normal amount. He used to self cath but does not any further.  Now, he simply wears depends. He's had normal bowel habits. No nausea. He states it "hurts too bad to eat".  Past Medical History  Diagnosis Date  . Spina bifida   . Hypertension    Past Surgical History  Procedure Laterality Date  . Back surgery      x2  . Hernia repair      umbilical hernia  . Ventriculoperitoneal shunt      revised 23yrs ago  . Hip surgery    . Testicle surgery      hx undescended testicles  . Cholecystectomy N/A 07/06/2013    Procedure: LAPAROSCOPIC CHOLECYSTECTOMY WITH INTRAOPERATIVE CHOLANGIOGRAM;  Surgeon: Lodema Pilot, DO;  Location: [facility];  Service: General;  Laterality: N/A;  . Colon resection N/A 07/08/2013    Procedure: EXPLORATORY LAPAROSCOPY, DIAGNOSTIC LAPAROSCOPY, PARTIAL COLECTOMY, ABDOMINAL WASHOUT, ABDOMINAL WOUND VAC;  Surgeon: Lodema Pilot, DO;  Location: [facility];  Service: General;  Laterality: N/A;   History reviewed. No pertinent family history. History  Substance Use Topics  . Smoking status: Never Smoker   . Smokeless tobacco: Never Used  . Alcohol Use: No    Review of Systems  Constitutional: Negative for fever,  chills, diaphoresis, appetite change and fatigue.  HENT: Negative for mouth sores, sore throat and trouble swallowing.   Eyes: Negative for visual disturbance.  Respiratory: Negative for cough, chest tightness, shortness of breath and wheezing.   Cardiovascular: Positive for chest pain.       And bilateral ribs radiating to the back.  Gastrointestinal: Negative for nausea, vomiting, abdominal pain, diarrhea and abdominal distention.  Endocrine: Negative for polydipsia, polyphagia and polyuria.  Genitourinary: Negative for dysuria, frequency and hematuria.  Musculoskeletal: Negative for gait problem.  Skin: Negative for color change, pallor and rash.  Neurological: Negative for dizziness, syncope, light-headedness and headaches.  Hematological: Does not bruise/bleed easily.  Psychiatric/Behavioral: Negative for behavioral problems and confusion.    Allergies  Ciprofloxacin; Latex; and Vancomycin  Home Medications   Current Outpatient Rx  Name  Route  Sig  Dispense  Refill  . acetaminophen (TYLENOL) 650 MG CR tablet   Oral   Take 1,300 mg by mouth every 8 (eight) hours as needed for pain.         Marland Kitchen omeprazole (PRILOSEC) 20 MG capsule   Oral   Take 20 mg by mouth daily.         . valsartan-hydrochlorothiazide (DIOVAN-HCT) 80-12.5 MG per tablet   Oral   Take 1 tablet by mouth daily.         Marland Kitchen  HYDROcodone-acetaminophen (NORCO/VICODIN) 5-325 MG per tablet   Oral   Take 1 tablet by mouth every 4 (four) hours as needed for pain.   10 tablet   0   . nitrofurantoin, macrocrystal-monohydrate, (MACROBID) 100 MG capsule   Oral   Take 1 capsule (100 mg total) by mouth 2 (two) times daily.   10 capsule   0    BP 72/56  Pulse 131  Temp(Src) 99.4 F (37.4 C) (Axillary)  Resp 32  Ht 5\' 3"  (1.6 m)  Wt 230 lb 2.6 oz (104.4 kg)  BMI 40.78 kg/m2  SpO2 99% Physical Exam  Constitutional:  Short stature. He is obese. His legs are atrophic consistent with spina bifida. He has  some difficulty phone conversation. Mother states "you have to say it to him very simply".  HENT:  His membranes moist. TMs normal. Conjunctiva are not pale or injected. No icterus.  Eyes: No scleral icterus.  Neck:  Short thick neck. No JVD or bruits.  Cardiovascular: S1 normal and S2 normal.   Murmurs rubs gallops. Not tachycardic.  Pulmonary/Chest:  Lungs clear. No diminished bases. No rash or vesicles across the chest wall on either side.  Marked kyphoscoliosis.  Abdominal:  Distended protuberant. Some reproducible tenderness in the RUQ.  Normal active bowel sounds.  Genitourinary:  Wearing a depends  Lymphadenopathy:  No Dependent edema  Neurological:  Normal use and movement of his upper extremities.Fredda Hammed to his lower extremities.  Skin:  No rash or vesicles across the chest.    ED Course  Procedures (including critical care time) Labs Review Labs Reviewed  MRSA PCR SCREENING - Abnormal; Notable for the following:    MRSA by PCR INVALID RESULTS, SPECIMEN SENT FOR CULTURE (*)    All other components within normal limits  URINALYSIS, ROUTINE W REFLEX MICROSCOPIC - Abnormal; Notable for the following:    Color, Urine AMBER (*)    APPearance TURBID (*)    Hgb urine dipstick SMALL (*)    Protein, ur 100 (*)    Nitrite POSITIVE (*)    Leukocytes, UA LARGE (*)    All other components within normal limits  CBC WITH DIFFERENTIAL - Abnormal; Notable for the following:    WBC 11.2 (*)    Neutrophils Relative % 78 (*)    Neutro Abs 8.8 (*)    All other components within normal limits  COMPREHENSIVE METABOLIC PANEL - Abnormal; Notable for the following:    Creatinine, Ser 0.39 (*)    AST 454 (*)    ALT 381 (*)    Alkaline Phosphatase 125 (*)    All other components within normal limits  URINE MICROSCOPIC-ADD ON - Abnormal; Notable for the following:    Bacteria, UA MANY (*)    All other components within normal limits  COMPREHENSIVE METABOLIC PANEL - Abnormal;  Notable for the following:    Glucose, Bld 124 (*)    Creatinine, Ser 0.45 (*)    Albumin 3.3 (*)    AST 451 (*)    ALT 576 (*)    Alkaline Phosphatase 145 (*)    Total Bilirubin 3.0 (*)    All other components within normal limits  COMPREHENSIVE METABOLIC PANEL - Abnormal; Notable for the following:    Sodium 125 (*)    Chloride 90 (*)    Glucose, Bld 115 (*)    Albumin 3.0 (*)    AST 127 (*)    ALT 378 (*)    Alkaline Phosphatase 130 (*)  Total Bilirubin 5.4 (*)    All other components within normal limits  CBC - Abnormal; Notable for the following:    WBC 17.5 (*)    Hemoglobin 12.1 (*)    HCT 36.5 (*)    All other components within normal limits  CBC - Abnormal; Notable for the following:    WBC 12.4 (*)    RBC 3.88 (*)    Hemoglobin 10.1 (*)    HCT 31.2 (*)    All other components within normal limits  COMPREHENSIVE METABOLIC PANEL - Abnormal; Notable for the following:    Sodium 125 (*)    Chloride 94 (*)    Glucose, Bld 130 (*)    Calcium 8.2 (*)    Albumin 2.6 (*)    AST 44 (*)    ALT 207 (*)    Total Bilirubin 2.0 (*)    All other components within normal limits  COMPREHENSIVE METABOLIC PANEL - Abnormal; Notable for the following:    Sodium 121 (*)    Chloride 92 (*)    CO2 16 (*)    Glucose, Bld 122 (*)    Calcium 7.2 (*)    Total Protein 4.6 (*)    Albumin 1.7 (*)    AST 125 (*)    ALT 147 (*)    Total Bilirubin 1.7 (*)    All other components within normal limits  CBC - Abnormal; Notable for the following:    WBC 18.3 (*)    All other components within normal limits  PROTIME-INR - Abnormal; Notable for the following:    Prothrombin Time 18.2 (*)    INR 1.55 (*)    All other components within normal limits  BLOOD GAS, ARTERIAL - Abnormal; Notable for the following:    pH, Arterial 7.103 (*)    pCO2 arterial 61.3 (*)    Bicarbonate 18.3 (*)    Acid-base deficit 12.0 (*)    All other components within normal limits  BLOOD GAS, ARTERIAL -  Abnormal; Notable for the following:    pH, Arterial 7.326 (*)    Bicarbonate 18.0 (*)    Acid-base deficit 6.7 (*)    All other components within normal limits  BLOOD GAS, ARTERIAL - Abnormal; Notable for the following:    pH, Arterial 7.304 (*)    pCO2 arterial 34.9 (*)    pO2, Arterial 79.5 (*)    Bicarbonate 16.8 (*)    Acid-base deficit 8.3 (*)    All other components within normal limits  BLOOD GAS, ARTERIAL - Abnormal; Notable for the following:    pH, Arterial 7.310 (*)    pCO2 arterial 33.4 (*)    pO2, Arterial 107.0 (*)    Bicarbonate 16.3 (*)    Acid-base deficit 8.5 (*)    All other components within normal limits  POCT I-STAT 4, (NA,K, GLUC, HGB,HCT) - Abnormal; Notable for the following:    Sodium 129 (*)    Glucose, Bld 120 (*)    HCT 24.0 (*)    Hemoglobin 8.2 (*)    All other components within normal limits  POCT I-STAT 7, (LYTES, BLD GAS, ICA,H+H) - Abnormal; Notable for the following:    pH, Arterial 7.144 (*)    pO2, Arterial 112.0 (*)    Bicarbonate 14.4 (*)    Acid-base deficit 14.0 (*)    Sodium 127 (*)    Calcium, Ion 1.05 (*)    HCT 24.0 (*)    Hemoglobin 8.2 (*)  All other components within normal limits  POCT I-STAT 7, (LYTES, BLD GAS, ICA,H+H) - Abnormal; Notable for the following:    pH, Arterial 7.072 (*)    pCO2 arterial 53.9 (*)    Bicarbonate 15.5 (*)    Acid-base deficit 14.0 (*)    Sodium 131 (*)    Calcium, Ion 1.01 (*)    HCT 30.0 (*)    Hemoglobin 10.2 (*)    All other components within normal limits  URINE CULTURE  SURGICAL PCR SCREEN  MRSA CULTURE  CULTURE, BLOOD (ROUTINE X 2)  CULTURE, BLOOD (ROUTINE X 2)  CULTURE, RESPIRATORY (NON-EXPECTORATED)  LIPASE, BLOOD  CBC  CORTISOL  TYPE AND SCREEN  ABO/RH  PREPARE RBC (CROSSMATCH)  PREPARE FRESH FROZEN PLASMA  PREPARE RBC (CROSSMATCH)  SURGICAL PATHOLOGY   Imaging Review No results found.  EKG Interpretation     Ventricular Rate:    PR Interval:    QRS  Duration:   QT Interval:    QTC Calculation:   R Axis:     Text Interpretation:              MDM   1. Cholelithiasis   2. Urinary tract infection   3. Acute cholecystitis   4. Elevated LFTs   5. Spina bifida   6. Acute respiratory failure   7. Septic shock      Tablets at 11,000. ALT and AST both elevated. Alkaline phosphatase slightly elevated. Has clear pyuria and bacteria. I think his UTIs likely chronic. We will culture and treat this. However the CT does show pericholecystic inflammation. Calcified stone. Thickened wall. He is still tenderness right upper quadrant , although less so after IV pain medications.  Physical Gen. surgery. The PA on call for Dr. Derrell Lolling returned my call. I have asked the surgical service to see the patient in consult.    Roney Marion, MD 07/12/13 1850  Roney Marion, MD 07/13/13 1053

## 2013-07-05 NOTE — ED Notes (Addendum)
Patient with spina bifida and hypertension started with bilateral chest pain under his ribs yesterday.  No injury reported.  and no associated symptoms other than he reports it is worse when he takes a deep breath. No fever, cough reported.

## 2013-07-05 NOTE — H&P (Signed)
General surgery attending note:  I personally interviewed this patient and his mother and have examined him. I reviewed his lab work and CT scan findings.  Physical exam is notable for a right upper quadrant incision for the peritoneal insertion of his VP shunt. It is also notable for a transverse incision below the umbilicus, which the mother states was umbilical hernia repair with mesh. He has mild tenderness in the right upper quadrant but this is not dramatic. He has a very around the abdomen. He does not appear to be in any severe distress.  Transaminases are significantly elevated. Bilirubin and lipase are normal. WBC 11,200. CT scan shows gallbladder wall thickening, inflammatory changes and gallstones. There is also a density within the urinary  Bladder.   Assessment/plan: Scheduled for a laparoscopic cholecystectomy with cholangiogram tomorrow, possible open.  I discussed the indications, details, techniques, and numerous risks of this procedure with the patient and his mother. They are aware that Dr. Biagio [name] will perform the procedure tomorrow most likely. There were the risk of bleeding, infection, conversion to open laparotomy, bile leak, injury to adjacent organs. They understand all of these issues. All their questions were answered. They agree with this plan. IV Zosyn N.p.o. After midnight  Chronic urinary tract infections and bladder density on CT scan. Urine culture pending  History umbilical hernia repair with mesh  Spina bifida, history of ventriculoperitoneal shunt on the right side, Associated paraplegia  Hypertension   [name], M.D., [facility] General and Minimally invasive Surgery Breast and Colorectal Surgery Office:   [phone] Pager:   [phone]

## 2013-07-05 NOTE — ED Notes (Signed)
Patient transported to CT 

## 2013-07-05 NOTE — ED Notes (Signed)
Pt states back hurts

## 2013-07-05 NOTE — Progress Notes (Signed)
Pt does not want to sign up for My Chart at current time. Briscoe Burns BSN, RN-BC Admissions RN  07/05/2013 4:24 PM

## 2013-07-06 ENCOUNTER — Inpatient Hospital Stay: Admit: 2013-07-06 | Payer: Self-pay | Admitting: General Surgery

## 2013-07-06 ENCOUNTER — Encounter (HOSPITAL_COMMUNITY): Payer: PRIVATE HEALTH INSURANCE | Admitting: Anesthesiology

## 2013-07-06 ENCOUNTER — Inpatient Hospital Stay (HOSPITAL_COMMUNITY): Payer: PRIVATE HEALTH INSURANCE | Admitting: Anesthesiology

## 2013-07-06 ENCOUNTER — Encounter (HOSPITAL_COMMUNITY): Payer: Self-pay | Admitting: Certified Registered Nurse Anesthetist

## 2013-07-06 ENCOUNTER — Encounter (HOSPITAL_COMMUNITY)
Admission: EM | Disposition: A | Discharge status: Other Acute Inpatient Hospital | Payer: Self-pay | Source: Home / Self Care

## 2013-07-06 DIAGNOSIS — K811 Chronic cholecystitis: Secondary | ICD-10-CM

## 2013-07-06 HISTORY — PX: CHOLECYSTECTOMY: SHX55

## 2013-07-06 LAB — COMPREHENSIVE METABOLIC PANEL
ALT: 576 U/L — ABNORMAL HIGH (ref 0–53)
Albumin: 3.3 g/dL — ABNORMAL LOW (ref 3.5–5.2)
Alkaline Phosphatase: 145 U/L — ABNORMAL HIGH (ref 39–117)
CO2: 23 mEq/L (ref 19–32)
Calcium: 8.9 mg/dL (ref 8.4–10.5)
Creatinine, Ser: 0.45 mg/dL — ABNORMAL LOW (ref 0.50–1.35)
GFR calc Af Amer: >90 mL/min (ref >90)
GFR calc non Af Amer: >90 mL/min (ref >90)
Glucose, Bld: 124 mg/dL — ABNORMAL HIGH (ref 70–99)
Sodium: 135 mEq/L (ref 135–145)
Total Bilirubin: 3 mg/dL — ABNORMAL HIGH (ref 0.3–1.2)

## 2013-07-06 LAB — CBC
Hemoglobin: 14.1 g/dL (ref 13.0–17.0)
MCH: 26.8 pg (ref 26.0–34.0)
MCV: 80 fL (ref 78.0–100.0)
RBC: 5.26 MIL/uL (ref 4.22–5.81)
WBC: 7.1 10*3/uL (ref 4.0–10.5)

## 2013-07-06 SURGERY — LAPAROSCOPIC CHOLECYSTECTOMY WITH INTRAOPERATIVE CHOLANGIOGRAM
Anesthesia: General | Site: Abdomen | Wound class: Contaminated

## 2013-07-06 MED ORDER — LACTATED RINGERS IR SOLN
Status: DC | PRN
  Administered 2013-07-06: 1

## 2013-07-06 MED ORDER — FENTANYL CITRATE 0.05 MG/ML IJ SOLN
INTRAMUSCULAR | Status: DC | PRN
  Administered 2013-07-06 (×4): 50 ug via INTRAVENOUS
  Administered 2013-07-06: 100 ug via INTRAVENOUS

## 2013-07-06 MED ORDER — LIDOCAINE-EPINEPHRINE (PF) 1 %-1:200000 IJ SOLN
INTRAMUSCULAR | Status: DC | PRN
  Administered 2013-07-06: 20 mL

## 2013-07-06 MED ORDER — LACTATED RINGERS IV SOLN
INTRAVENOUS | Status: DC | PRN
  Administered 2013-07-06 (×2): via INTRAVENOUS

## 2013-07-06 MED ORDER — GLYCOPYRROLATE 0.2 MG/ML IJ SOLN
INTRAMUSCULAR | Status: DC | PRN
  Administered 2013-07-06: 0.6 mg via INTRAVENOUS

## 2013-07-06 MED ORDER — PROMETHAZINE HCL 25 MG/ML IJ SOLN: 6.2500 mg | INTRAMUSCULAR | Status: DC | PRN

## 2013-07-06 MED ORDER — PROPOFOL 10 MG/ML IV BOLUS
INTRAVENOUS | Status: DC | PRN
  Administered 2013-07-06: 200 mg via INTRAVENOUS

## 2013-07-06 MED ORDER — 0.9 % SODIUM CHLORIDE (POUR BTL) OPTIME
TOPICAL | Status: DC | PRN
  Administered 2013-07-06: 1000 mL

## 2013-07-06 MED ORDER — LIDOCAINE-EPINEPHRINE 1 %-1:100000 IJ SOLN
INTRAMUSCULAR | Status: AC
  Filled 2013-07-06: qty 1

## 2013-07-06 MED ORDER — BUPIVACAINE HCL (PF) 0.25 % IJ SOLN
INTRAMUSCULAR | Status: AC
  Filled 2013-07-06: qty 30

## 2013-07-06 MED ORDER — LIDOCAINE HCL (CARDIAC) 20 MG/ML IV SOLN
INTRAVENOUS | Status: DC | PRN
  Administered 2013-07-06: 50 mg via INTRAVENOUS

## 2013-07-06 MED ORDER — HYDROMORPHONE HCL PF 1 MG/ML IJ SOLN
INTRAMUSCULAR | Status: AC
  Filled 2013-07-06: qty 1

## 2013-07-06 MED ORDER — BUPIVACAINE HCL (PF) 0.25 % IJ SOLN
INTRAMUSCULAR | Status: DC | PRN
  Administered 2013-07-06: 20 mL

## 2013-07-06 MED ORDER — NEOSTIGMINE METHYLSULFATE 1 MG/ML IJ SOLN
INTRAMUSCULAR | Status: DC | PRN
  Administered 2013-07-06: 5 mg via INTRAVENOUS

## 2013-07-06 MED ORDER — SUCCINYLCHOLINE CHLORIDE 20 MG/ML IJ SOLN
INTRAMUSCULAR | Status: DC | PRN
  Administered 2013-07-06: 100 mg via INTRAVENOUS

## 2013-07-06 MED ORDER — HYDROCODONE-ACETAMINOPHEN 5-325 MG PO TABS
1.0000 | ORAL_TABLET | ORAL | Status: DC | PRN
  Administered 2013-07-07 – 2013-07-08 (×4): 2 via ORAL
  Filled 2013-07-06 (×4): qty 2

## 2013-07-06 MED ORDER — LACTATED RINGERS IV SOLN: INTRAVENOUS | Status: DC

## 2013-07-06 MED ORDER — HYDROMORPHONE HCL PF 1 MG/ML IJ SOLN: 0.2500 mg | INTRAMUSCULAR | Status: DC | PRN

## 2013-07-06 MED ORDER — HEPARIN SODIUM (PORCINE) 5000 UNIT/ML IJ SOLN
5000.0000 [IU] | Freq: Three times a day (TID) | INTRAMUSCULAR | Status: DC
Start: 2013-07-06 — End: 2013-07-08
  Administered 2013-07-06 – 2013-07-08 (×5): 5000 [IU] via SUBCUTANEOUS
  Filled 2013-07-06 (×8): qty 1

## 2013-07-06 MED ORDER — ROCURONIUM BROMIDE 100 MG/10ML IV SOLN
INTRAVENOUS | Status: DC | PRN
  Administered 2013-07-06: 5 mg via INTRAVENOUS
  Administered 2013-07-06 (×2): 10 mg via INTRAVENOUS
  Administered 2013-07-06: 35 mg via INTRAVENOUS

## 2013-07-06 SURGICAL SUPPLY — 49 items
ADH SKN CLS APL DERMABOND .7 (GAUZE/BANDAGES/DRESSINGS) ×1
APL SKNCLS STERI-STRIP NONHPOA (GAUZE/BANDAGES/DRESSINGS) ×1
APPLIER CLIP LOGIC TI 5 (MISCELLANEOUS) ×1 IMPLANT
APPLIER CLIP ROT 10 11.4 M/L (STAPLE) ×2
APR CLP MED LRG 11.4X10 (STAPLE) ×1
APR CLP MED LRG 33X5 (MISCELLANEOUS) ×1
BAG SPEC RTRVL LRG 6X4 10 (ENDOMECHANICALS) ×1
BENZOIN TINCTURE PRP APPL 2/3 (GAUZE/BANDAGES/DRESSINGS) ×1 IMPLANT
BLADE HEX COATED 2.75 (ELECTRODE) ×2 IMPLANT
CABLE HIGH FREQUENCY MONO STRZ (ELECTRODE) ×2 IMPLANT
CATH REDDICK CHOLANGI 4FR 50CM (CATHETERS) ×1 IMPLANT
CHLORAPREP W/TINT 26ML (MISCELLANEOUS) ×4 IMPLANT
CLIP APPLIE ROT 10 11.4 M/L (STAPLE) ×1 IMPLANT
CLOSURE STERI-STRIP 1/4X4 (GAUZE/BANDAGES/DRESSINGS) ×1 IMPLANT
DECANTER SPIKE VIAL GLASS SM (MISCELLANEOUS) ×2 IMPLANT
DERMABOND ADVANCED (GAUZE/BANDAGES/DRESSINGS) ×1
DERMABOND ADVANCED .7 DNX12 (GAUZE/BANDAGES/DRESSINGS) ×1 IMPLANT
DRAIN CHANNEL RND F F (WOUND CARE) ×1 IMPLANT
DRAPE C-ARM 42X120 X-RAY (DRAPES) ×2 IMPLANT
DRAPE LAPAROSCOPIC ABDOMINAL (DRAPES) ×2 IMPLANT
DRAPE UTILITY XL STRL (DRAPES) ×2 IMPLANT
ELECT REM PT RETURN 9FT ADLT (ELECTROSURGICAL) ×2
ELECTRODE REM PT RTRN 9FT ADLT (ELECTROSURGICAL) ×1 IMPLANT
ENDOLOOP SUT PDS II  0 18 (SUTURE) ×2
ENDOLOOP SUT PDS II 0 18 (SUTURE) IMPLANT
EVACUATOR SILICONE 100CC (DRAIN) ×1 IMPLANT
GAUZE SPONGE 2X2 8PLY STRL LF (GAUZE/BANDAGES/DRESSINGS) IMPLANT
GLOVE SURG SS PI 7.5 STRL IVOR (GLOVE) ×4 IMPLANT
GOWN STRL REIN XL XLG (GOWN DISPOSABLE) ×4 IMPLANT
KIT BASIN OR (CUSTOM PROCEDURE TRAY) ×2 IMPLANT
PENCIL BUTTON HOLSTER BLD 10FT (ELECTRODE) ×2 IMPLANT
POUCH SPECIMEN RETRIEVAL 10MM (ENDOMECHANICALS) ×2 IMPLANT
SCISSORS LAP 5X35 DISP (ENDOMECHANICALS) ×2 IMPLANT
SET CHOLANGIOGRAPH MIX (MISCELLANEOUS) ×2 IMPLANT
SET IRRIG TUBING LAPAROSCOPIC (IRRIGATION / IRRIGATOR) ×2 IMPLANT
SLEEVE XCEL OPT CAN 5 100 (ENDOMECHANICALS) ×2 IMPLANT
SPONGE GAUZE 2X2 STER 10/PKG (GAUZE/BANDAGES/DRESSINGS) ×1
SUT MNCRL AB 4-0 PS2 18 (SUTURE) ×4 IMPLANT
SUT SILK 0 SH 30 (SUTURE) ×1 IMPLANT
SUT VICRYL 0 UR6 27IN ABS (SUTURE) ×3 IMPLANT
SYR 20CC LL (SYRINGE) ×2 IMPLANT
TAPE CLOTH SURG 4X10 WHT LF (GAUZE/BANDAGES/DRESSINGS) ×1 IMPLANT
TOWEL OR 17X26 10 PK STRL BLUE (TOWEL DISPOSABLE) ×2 IMPLANT
TOWEL OR NON WOVEN STRL DISP B (DISPOSABLE) ×2 IMPLANT
TRAY LAP CHOLE (CUSTOM PROCEDURE TRAY) ×2 IMPLANT
TROCAR BALLN 12MMX100 BLUNT (TROCAR) ×2 IMPLANT
TROCAR BLADELESS OPT 5 100 (ENDOMECHANICALS) ×2 IMPLANT
TROCAR XCEL NON-BLD 11X100MML (ENDOMECHANICALS) ×2 IMPLANT
TUBING INSUFFLATION 10FT LAP (TUBING) ×2 IMPLANT

## 2013-07-06 NOTE — Anesthesia Preprocedure Evaluation (Addendum)
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 negative psych ROS   GI/Hepatic negative GI ROS, Neg liver ROS,   Endo/Other  negative endocrine ROS  Renal/GU negative Renal ROS  negative genitourinary   Musculoskeletal negative musculoskeletal ROS (+)   Abdominal   Peds  Hematology negative hematology ROS (+)   Anesthesia Other Findings Diminished inspiratory effort secondary to RUQ pain  Reproductive/Obstetrics                           Anesthesia Physical Anesthesia Plan  ASA: III and emergent  Anesthesia Plan: General   Post-op Pain Management:    Induction: Intravenous and Rapid sequence  Airway Management Planned: Oral ETT  Additional Equipment:   Intra-op Plan:   Post-operative Plan: Extubation in OR  Informed Consent: I have reviewed the patients History and Physical, chart, labs and discussed the procedure including the risks, benefits and alternatives for the proposed anesthesia with the patient or authorized representative who has indicated his/her understanding and acceptance.   Dental advisory given  Plan Discussed with: CRNA  Anesthesia Plan Comments:         Anesthesia Quick Evaluation

## 2013-07-06 NOTE — Anesthesia Postprocedure Evaluation (Signed)
Anesthesia Post Note  Patient: [name]  Procedure(s) Performed: Procedure(s) (LRB): LAPAROSCOPIC CHOLECYSTECTOMY WITH INTRAOPERATIVE CHOLANGIOGRAM (N/A)  Anesthesia type: General  Patient location: PACU  Post pain: Pain level controlled  Post assessment: Post-op Vital signs reviewed  Last Vitals:  Filed Vitals:   07/06/13 1145  BP:   Pulse:   Temp: 36.6 C  Resp:     Post vital signs: Reviewed  Level of consciousness: sedated  Complications: No apparent anesthesia complications

## 2013-07-06 NOTE — Transfer of Care (Signed)
Immediate Anesthesia Transfer of Care Note  Patient: [name]  Procedure(s) Performed: Procedure(s): LAPAROSCOPIC CHOLECYSTECTOMY WITH INTRAOPERATIVE CHOLANGIOGRAM (N/A)  Patient Location: PACU  Anesthesia Type:General  Level of Consciousness: awake and alert   Airway & Oxygen Therapy: Patient Spontanous Breathing and Patient connected to face mask oxygen  Post-op Assessment: Report given to PACU RN and Post -op Vital signs reviewed and stable  Post vital signs: Reviewed and stable  Complications: No apparent anesthesia complications

## 2013-07-06 NOTE — Op Note (Signed)
NAME:  KLEBER, CREAN NO.:  [id number]  MEDICAL RECORD NO.:  [id number]  LOCATION:  1536                         FACILITY:  [facility]  PHYSICIAN:  Lodema Pilot, MD       DATE OF BIRTH:  [birth year]  DATE OF PROCEDURE:  07/06/2013 DATE OF DISCHARGE:                              OPERATIVE REPORT   ADDENDUM:  Several times throughout the case, Dr. Abbey Chatters and I discussed converting to open procedure, but we felt that this would not actually confer any additional advantage since the patient's body habitus was such that his liver was up under his ribs and visualization would be very difficult.  We would likely need to do this through a midline incision, which also would limit the visualization given his prior VP shunt, which was in the subcutaneous tissues overlying the gallbladder and again did not feel that this would confer any additional benefit.  We felt that the anatomy was clearly visualized especially after our dome down gallbladder dissection.  This was similar to open the dissections and that it would really not confer any additional benefit to this patient and for completion of the procedure.          ______________________________ Lodema Pilot, MD     BL/MEDQ  D:  07/06/2013  T:  07/06/2013  Job:  [id number]

## 2013-07-06 NOTE — Brief Op Note (Signed)
07/05/2013 - 07/06/2013  11:44 AM  PATIENT:  [name]  32 y.o. male  PRE-OPERATIVE DIAGNOSIS:  cholelithiasis  POST-OPERATIVE DIAGNOSIS:  cholecystitis  PROCEDURE:  Procedure(s): LAPAROSCOPIC CHOLECYSTECTOMY WITH INTRAOPERATIVE CHOLANGIOGRAM (N/A)  SURGEON:  Surgeon(s) and Role:    * Lodema Pilot, DO - Primary    * Adolph Pollack, MD - Assisting  PHYSICIAN ASSISTANT:   ASSISTANTS: rosenbower   ANESTHESIA:   general  EBL:  Total I/O In: 1000 [I.V.:1000] Out: -   BLOOD ADMINISTERED:none  DRAINS: (5F) Jackson-Pratt drain(s) with closed bulb suction in the GB fossa   LOCAL MEDICATIONS USED:  MARCAINE    and LIDOCAINE   SPECIMEN:  Source of Specimen:  gallbladder  DISPOSITION OF SPECIMEN:  PATHOLOGY  COUNTS:  YES  TOURNIQUET:  * No tourniquets in log *  DICTATION: .Other Dictation: Dictation Number [id number]  PLAN OF CARE: Admit to inpatient   PATIENT DISPOSITION:  PACU - hemodynamically stable.   Delay start of Pharmacological VTE agent (>24hrs) due to surgical blood loss or risk of bleeding: no

## 2013-07-06 NOTE — Progress Notes (Signed)
Day of Surgery  Subjective: About the same.  Mainly complains of back pain.    Objective: Vital signs in last 24 hours: Temp:  [97.4 F (36.3 C)-98.7 F (37.1 C)] 98.1 F (36.7 C) (10/25 0621) Pulse Rate:  [73-92] 86 (10/25 0621) Resp:  [16-20] 16 (10/25 0621) BP: (118-132)/(57-83) 119/77 mmHg (10/25 0621) SpO2:  [97 %-100 %] 97 % (10/25 0621) Weight:  [152 lb 8.9 oz (69.2 kg)] 152 lb 8.9 oz (69.2 kg) (10/25 0621)    Intake/Output from previous day: 10/24 0701 - 10/25 0700 In: 1047.9 [I.V.:1047.9] Out: -  Intake/Output this shift:    General appearance: cooperative and no distress Resp: nonlabored Cardio: normal rate, regular GI: soft, mild RUQ tenderness, ND, RUQ shunt incision, umbilical hernia incision, no peritoneal signs  Lab Results:   Recent Labs  07/05/13 1310 07/06/13 0417  WBC 11.2* 7.1  HGB 14.7 14.1  HCT 44.3 42.1  PLT 226 203   BMET  Recent Labs  07/05/13 1310 07/06/13 0417  NA 138 135  K 3.9 4.2  CL 103 102  CO2 25 23  GLUCOSE 98 124*  BUN 15 7  CREATININE 0.39* 0.45*  CALCIUM 9.1 8.9   PT/INR No results found for this basename: LABPROT, INR,  in the last 72 hours ABG No results found for this basename: PHART, PCO2, PO2, HCO3,  in the last 72 hours  Studies/Results: Ct Abdomen Pelvis Wo Contrast  07/05/2013   CLINICAL DATA:  Back pain with hematuria. History of spina bifida and back surgery.  EXAM: CT ABDOMEN AND PELVIS WITHOUT CONTRAST  TECHNIQUE: Multidetector CT imaging of the abdomen and pelvis was performed following the standard protocol without intravenous contrast.  COMPARISON:  Abdominal pelvic CT 11/23/2006.  FINDINGS: The visualized lung bases are clear. There is no significant pleural or pericardial effusion.  The intra-abdominal anatomy is distorted by the spinal deformities. As evaluated in the noncontrast study, the liver, spleen and adrenal glands appear normal. The pancreas demonstrates fatty replacement but no change or  acute abnormality.  There is progressive irregular thickening of the gallbladder walls with adjacent soft tissue stranding suspicious for cholecystitis. There is a small central calcified gallstone. No significantly dilatation is seen.  Both kidneys appear normal. There is no hydronephrosis or urinary tract calcification. The bladder is trabeculated and the thick-walled and protrudes anteriorly over the symphysis pubis, similar to the prior study. The central density of the bladder contents is increased compared with the prior examination. The prostate gland and seminal vesicles appear normal.  No acute bowel findings identified. There is probable chronic prolapse of the rectum and bladder base into the perineum. Ventriculoperitoneal shunt is noted without focal fluid collection adjacent to its tip in the right lower quadrant. The anterior abdominal wall is diffusely thinned without focal recurrent hernia.  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 Some heterotopic ossification posterior to the right femoral greater trochanter has mildly increased. No acute osseous findings are seen.  IMPRESSION: 1. Progressive gallbladder wall thickening and surrounding inflammatory change suspicious for chronic cholecystitis. Underlying gallstones are noted. 2. Increased density within the bladder lumen may be related to hematuria or urinary tract infection. The bladder appears thick-walled and trabeculated. A bladder mass cannot be excluded. There is no evidence of urinary tract calculus or hydronephrosis. 3. Chronic findings related to spina bifida and prior lumbar fusion.   Electronically Signed   By: Roxy Horseman M.D.   On:  07/05/2013 14:12   Dg Chest Port 1 View  07/05/2013   CLINICAL DATA:  Back pain, history of spina bifida  EXAM: PORTABLE CHEST - 1 VIEW  COMPARISON:  Concurrently obtained CT scan of the abdomen and pelvis  FINDINGS: Borderline nondiagnostic  chest radiograph secondary to severe kyphotic patient positioning and body habitus. There are very low inspiratory volumes. Incompletely imaged posterior spinal stabilization hardware and right-sided ventriculoperitoneal shunt catheter. Correlation with the concurrently obtained is CT scan of the abdomen and pelvis demonstrates no significant airspace disease in the lung bases which are the portion of the lung most limited on this radiograph. The upper and a peripheral aspect of the lungs appear clear.  IMPRESSION: Very limited chest x-ray secondary to patient body habitus and positioning. No definite acute cardiopulmonary process. New  The central and inferior aspect of the chest image is essentially non diagnostic. However, correlation with the concurrently obtained CT scan of the abdomen and pelvis including the lung bases demonstrates no acute abnormality in this region.   Electronically Signed   By: Malachy Moan M.D.   On: 07/05/2013 13:44    Anti-infectives: Anti-infectives   Start     Dose/Rate Route Frequency Ordered Stop   07/05/13 1800  piperacillin-tazobactam (ZOSYN) IVPB 3.375 g     3.375 g 12.5 mL/hr over 240 Minutes Intravenous Every 8 hours 07/05/13 1637        Assessment/Plan: s/p Procedure(s): LAPAROSCOPIC CHOLECYSTECTOMY WITH INTRAOPERATIVE CHOLANGIOGRAM (N/A) He does have mild, focal RUQ pain but given history and labs and CT this is most likely acute cholecystitis.  LFT's are up and he may have choledocholithiasis.  will plan for cholangiogram as well.  may need postop ERCP.  I discussed with the patient and his mother the procedure and the risks. The risks of infection, bleeding, pain, persistent symptoms, scarring, injury to bowel or bile ducts, retained stone, diarrhea, need for additional procedures, and need for open surgery discussed with the patient.  He would like to proceed with lap chole and IOC.   LOS: 1 day    [name] 07/06/2013

## 2013-07-07 DIAGNOSIS — R7989 Other specified abnormal findings of blood chemistry: Secondary | ICD-10-CM | POA: Diagnosis present

## 2013-07-07 DIAGNOSIS — N319 Neuromuscular dysfunction of bladder, unspecified: Secondary | ICD-10-CM | POA: Diagnosis present

## 2013-07-07 DIAGNOSIS — Q059 Spina bifida, unspecified: Secondary | ICD-10-CM

## 2013-07-07 LAB — COMPREHENSIVE METABOLIC PANEL
ALT: 378 U/L — ABNORMAL HIGH (ref 0–53)
AST: 127 U/L — ABNORMAL HIGH (ref 0–37)
BUN: 7 mg/dL (ref 6–23)
CO2: 25 mEq/L (ref 19–32)
Chloride: 90 mEq/L — ABNORMAL LOW (ref 96–112)
Creatinine, Ser: 0.62 mg/dL (ref 0.50–1.35)
GFR calc non Af Amer: >90 mL/min (ref >90)
Glucose, Bld: 115 mg/dL — ABNORMAL HIGH (ref 70–99)
Potassium: 4.3 mEq/L (ref 3.5–5.1)
Total Bilirubin: 5.4 mg/dL — ABNORMAL HIGH (ref 0.3–1.2)

## 2013-07-07 LAB — CBC
MCH: 26.4 pg (ref 26.0–34.0)
MCHC: 33.2 g/dL (ref 30.0–36.0)
Platelets: 286 10*3/uL (ref 150–400)
RBC: 4.58 MIL/uL (ref 4.22–5.81)

## 2013-07-07 LAB — URINE CULTURE

## 2013-07-07 NOTE — Progress Notes (Signed)
Pt has foley catheter, need order to continue it. Pt self catheterizes  at home due to spina bifada, and with his current condition would have a difficult time doing so here. Request order to continue foley.

## 2013-07-07 NOTE — Op Note (Signed)
NAME:  [name], [name] NO.:  [id number]  MEDICAL RECORD NO.:  [id number]  LOCATION:  1536                         FACILITY:  [facility]  PHYSICIAN:  Lodema Pilot, MD       DATE OF BIRTH:  [birth year]  DATE OF PROCEDURE:  07/06/2013 DATE OF DISCHARGE:                              OPERATIVE REPORT   PROCEDURE:  Laparoscopic cholecystectomy.  PREOPERATIVE DIAGNOSIS:  Acute cholecystitis.  POSTOP DIAGNOSIS:  Acute cholecystitis.  SURGEON:  Lodema Pilot, MD  ASSISTANT:  Adolph Pollack, M.D.  ANESTHESIA:  General endotracheal anesthesia with 30 mL of 1% lidocaine with epinephrine and 0.25% Marcaine in a 50:50 mixture.  FLUIDS:  1300 mL of crystalloid.  ESTIMATED BLOOD LOSS:  100 mL.  DRAINS:  A 19-French Blake drain placed in the gallbladder fossa.  SPECIMENS:  Gallbladder sent to Pathology for permanent section.  COMPLICATIONS:  None apparent.  FINDINGS:  Significant amount of inflammatory change in the right upper quadrant, likely perforated gallbladder which had been sealed by the abdominal wall.  He had purulence and multiple gallstones.  Unable to do cholangiogram.  INDICATION FOR PROCEDURE:  [name] is a 32 year old male with spina bifida, with 3-day history of abdominal pain and back pain.  He had a CT scan of the abdomen which showed thickened gallbladder, inflammatory change of the right upper quadrant, with calcified gallstones.  He said he had rising LFTs.  OPERATIVE DETAILS:  [name] was seen and evaluated on the surgical ward with his mother and risks and benefits of procedure were discussed in lay terms.  Informed consent had been obtained.  He was already on therapeutic antibiotics and taken to the operating room, placed on table in supine position.  General endotracheal anesthesia obtained and Foley catheter was placed.  The abdomen was very round and protuberant.  This was prepped and draped in standard surgical fashion.   Supraumbilical midline incision was made little higher than typical given his prior umbilical hernia repair with mesh.  Procedure time-out was performed to confirm proper patient and procedure, and then, a supraumbilical midline incision was made in the skin above his prior umbilical hernia mesh. The fascia was elevated and sharply incised and the peritoneum entered bluntly and a 12-mm balloon port was placed through the incision and pneumoperitoneum was obtained.  Laparoscope was introduced and there was no evidence of bleeding or bowel injury upon entry.  He had a significant amount of adhesions in the right upper quadrant concerning for an acute and chronic process.  His VP shunt was visualized more in the right lower quadrant and this did not come into play during the procedure.  A 5-mm right lateral abdominal port was placed under direct visualization and this allowed [place] to take down some of the adhesions using sharp dissection.  Another 11-mm epigastric port was placed and a 5-mm right upper quadrant port was also placed.  I took down the adhesions over the liver using sharp dissection.  It appeared that the gallbladder was densely adhered to the abdominal wall and as I cut this away from the abdominal wall, small amount of purulence was identified. He also had some yellow, cholesterol-appearing  gallstones which were identified and it appeared as though he had likely perforated his gallbladder and this had sealed to the abdominal wall.  As I cut this away from the abdominal wall and bluntly dissected the omentum off the gallbladder, we were able to elevate the gallbladder, although the anatomy was still very difficult to identify given the edema and inflammatory and infectious changes in the area.  We were able to clearly visualize the duodenum which was not directly affected.  The colon did not appear to be stuck to the gallbladder and we did not use any cautery in this area and  the omentum was peeled away using blunt dissection.  Since there was a hole in the dome of the gallbladder, this helped clarify our anatomy and as I bluntly dissected posterior to the gallbladder between the gallbladder and liver and created a window between the liver and the gallbladder.  This allowed me to further dissect the distal portion of the gallbladder from the gallbladder fossa and assessed, I was able to perform a dome down dissection of the gallbladder.  This tapered down to an area and then to what appeared to be a large lymph node, about 2 cm lymph node near the area of Hartmann's pouch, but below the lymph node, we did not feel as though it was safe to dissect any lower.  There was a large gallstone that was removed from the gallbladder and after opening the gallbladder did not reveal any additional stones other than the small stones that we had already removed.  I had wanted to perform a cholangiogram, although the [facility] catheter, I would not be able to seal the Tebbetts catheter in order to perform a cholangiogram and was unable to use the Reddick balloon catheter because it contains latex and due to the patient's latex allergy, I was unable to use this catheter.  For this, we felt confident that we were not injuring any of the critical structures and possibly performing a subtotal cholecystectomy, although this did appear to taper down to a fairly small diameter and I think that the majority of the gallbladder was removed.  I placed an endo-loop around the gallbladder just above this large lymph node around the base of the dissected the gallbladder and then transected the gallbladder sharply and removed the remainder of the gallbladder in an EndoCatch bag through the umbilical trocar site.  The removed portion of the gallbladder was a small portion but he did appear visually to have a very small and contracted gallbladder.  We inspected the gallbladder fossa for hemostasis,  which was noted to be adequate and there was no evidence of bile leakage from the stump of the gallbladder or cystic duct.  There was no evidence of bowel injury.  I placed a 19-French Blake drain in the gallbladder fossa and exited this through the right upper quadrant trocar site, and this was sutured in place with a nylon drain stitch.  Plan was to follow the LFTs and if they continued to trend upwards in the morning, we would plan on consulting Gastroenterology for ERCP to delineate the anatomy and to evaluate for possible choledocholithiasis.  I irrigated the right upper quadrant until the irrigation returned clear and suctioned the fluid and removed the right upper quadrant trocars under direct visualization.  Again, the abdominal wall was noted to be hemostatic.  I closed the umbilical fascia with interrupted 0 Vicryl sutures in open fashion and the sutures were secured and the abdomen was  again inspected, the abdominal wall closure was noted be adequate without any evidence of bleeding or bowel injury and the right upper quadrant appeared hemostatic.  The wounds were irrigated and injected with 30 mL of 1% lidocaine with epinephrine and 0.25% Marcaine in a 50:50 mixture. Skin edges were approximated with 4-0 Monocryl and subcuticular suture.  Skin was washed and dried and benzoin and Steri-Strips were applied.  Sterile dressings were applied.  Foley catheter was left in place and the patient was stable and ready for transfer to the recovery room in stable condition.          ______________________________ Lodema Pilot, MD     BL/MEDQ  D:  07/06/2013  T:  07/06/2013  Job:  [id number]

## 2013-07-07 NOTE — Consult Note (Signed)
[facility] Consult Note      07/07/2013 Dr. Abbey Chatters Primary Care Physician:  Geraldo Pitter, MD Primary Gastroenterologist:  none  Reason for Consult-  R/o CBD stone  HPI: [name] is a 32 y.o. male with spina bifida and paraplegia who was admitted on 10/24 with right upper quadrant pain and lower chest pain. He had workup in the emergency room with CT scan of the abdomen and pelvis which was done without IV contrast. This was consistent with an acute cholecystitis with inflammatory changes around the gallbladder. His anatomy was somewhat distorted due to his spina bifida but no ductal dilation was noted. Patient underwent laparoscopic cholecystectomy yesterday and was found to have a perforated gallbladder with significant adhesions and gallbladder attached to the abdominal wall. A Blake drain was placed, IOC was not able to be done. He is so for this morning but otherwise feels fine and had tolerated by mouth  last evening. He was noted to have elevated LFTs on admission with mildly elevated bilirubin. Yesterday his bilirubin was in the 3 range in today bilirubin is 5.4, transaminases however are trending down. WBC is up at 17.5 today is hyponatremic at 125. Patient had remote spinal surgery with Harrington rods which are felt to be steel and therefore cannot have an MRI. He also has a ventriculoperitoneal shunt.    Past Medical History  Diagnosis Date  . Spina bifida   . Hypertension     Past Surgical History  Procedure Laterality Date  . Back surgery      x2  . Hernia repair      umbilical hernia  . Ventriculoperitoneal shunt      revised 12yrs ago  . Hip surgery    . Testicle surgery      hx undescended testicles    Prior to Admission medications   Medication Sig Start Date End Date Taking? Authorizing Provider  acetaminophen (TYLENOL) 650 MG CR tablet Take 1,300 mg by mouth every 8 (eight) hours as needed for pain.   Yes Historical Provider, MD    omeprazole (PRILOSEC) 20 MG capsule Take 20 mg by mouth daily.   Yes Historical Provider, MD  valsartan-hydrochlorothiazide (DIOVAN-HCT) 80-12.5 MG per tablet Take 1 tablet by mouth daily.   Yes Historical Provider, MD  HYDROcodone-acetaminophen (NORCO/VICODIN) 5-325 MG per tablet Take 1 tablet by mouth every 4 (four) hours as needed for pain. 07/05/13   Roney Marion, MD  nitrofurantoin, macrocrystal-monohydrate, (MACROBID) 100 MG capsule Take 1 capsule (100 mg total) by mouth 2 (two) times daily. 07/05/13   Roney Marion, MD    Current Facility-Administered Medications  Medication Dose Route Frequency Provider Last Rate Last Dose  . dextrose 5 % and 0.45 % NaCl with KCl 20 mEq/L infusion   Intravenous Continuous Letha Cape, PA-C 125 mL/hr at 07/07/13 0900    . diphenhydrAMINE (BENADRYL) injection 12.5-25 mg  12.5-25 mg Intravenous Q6H PRN Letha Cape, PA-C   12.5 mg at 07/05/13 2045   Or  . diphenhydrAMINE (BENADRYL) 12.5 MG/5ML elixir 12.5-25 mg  12.5-25 mg Oral Q6H PRN Letha Cape, PA-C      . heparin injection 5,000 Units  5,000 Units Subcutaneous Q8H Lodema Pilot, DO   5,000 Units at 07/07/13 0609  . hydrochlorothiazide (MICROZIDE) capsule 12.5 mg  12.5 mg Oral Daily Roney Marion, MD   12.5 mg at 07/07/13 0953  . HYDROcodone-acetaminophen (NORCO/VICODIN) 5-325 MG per tablet 1-2 tablet  1-2 tablet Oral Q4H  PRN Lodema Pilot, DO   2 tablet at 07/07/13 0910  . influenza vac split quadrivalent PF (FLUARIX) injection 0.5 mL  0.5 mL Intramuscular Tomorrow-1000 Roney Marion, MD      . irbesartan (AVAPRO) tablet 75 mg  75 mg Oral Daily Roney Marion, MD   75 mg at 07/07/13 0954  . morphine 2 MG/ML injection 1-4 mg  1-4 mg Intravenous Q2H PRN Letha Cape, PA-C   4 mg at 07/07/13 0641  . ondansetron (ZOFRAN) injection 4 mg  4 mg Intravenous Q6H PRN Letha Cape, PA-C   4 mg at 07/06/13 1106  . pantoprazole (PROTONIX) EC tablet 40 mg  40 mg Oral Daily Letha Cape, PA-C   40 mg at  07/07/13 0953  . piperacillin-tazobactam (ZOSYN) IVPB 3.375 g  3.375 g Intravenous Q8H Letha Cape, PA-C 12.5 mL/hr at 07/07/13 0143 3.375 g at 07/07/13 0143    Allergies as of 07/05/2013 - Review Complete 07/05/2013  Allergen Reaction Noted  . Ciprofloxacin  07/05/2013  . Latex Hives 07/05/2013  . Vancomycin Itching and Swelling 07/05/2013    History reviewed. No pertinent family history.  History   Social History  . Marital Status: Single    Spouse Name: N/A    Number of Children: N/A  . Years of Education: N/A   Occupational History  . Not on file.   Social History Main Topics  . Smoking status: Never Smoker   . Smokeless tobacco: Never Used  . Alcohol Use: No  . Drug Use: No  . Sexual Activity: Not on file   Other Topics Concern  . Not on file   Social History Narrative  . No narrative on file    Review of Systems: Pertinent positive and negative review of systems were noted in the above HPI section.  All other review of systems was otherwise negative.  Physical Exam: Vital signs in last 24 hours: Temp:  [96.9 F (36.1 C)-98.4 F (36.9 C)] 98 F (36.7 C) (10/26 0618) Pulse Rate:  [72-123] 116 (10/26 0618) Resp:  [15-24] 18 (10/26 0618) BP: (94-145)/(50-83) 111/75 mmHg (10/26 0618) SpO2:  [98 %-100 %] 98 % (10/26 0618) Weight:  [144 lb 2.9 oz (65.4 kg)] 144 lb 2.9 oz (65.4 kg) (10/25 2041)   General:   WD male with spina bifida, alert in NAD Head:  Normocephalic and atraumatic. Eyes:  Sclera clear, early  icterus.   Conjunctiva pink. Ears:  Normal auditory acuity. Nose:  No deformity, discharge,  or lesions. Mouth:clear Neck:  Supple; no masses or thyromegaly. Lungs:   clear. Heart:  Tachy RR with S1S2 Abdomen: large ,protuberant, mild diffuse tenderness, BS+, drain in place- small amt of bloody drainage. Rectal: not done Msk:  Symmetrical without gross deformities.kyphotic Pulses:  Normal pulses noted. Extremities:  Without clubbing or  edema. Neurologic:  Alert and  oriented x4;  paraplegic Skin:  Intact without significant lesions or rashes. Cervical Nodes:  No significant cervical adenopathy. Psych:  Alert and cooperative. Normal mood and affect.  Intake/Output from previous day: 10/25 0701 - 10/26 0700 In: 3385.8 [P.O.:240; I.V.:2945.8; IV Piggyback:200] Out: 2127 [Urine:2005; Drains:122] Intake/Output this shift: Total I/O In: 120 [P.O.:120] Out: 750 [Urine:750]  Lab Results:  Recent Labs  07/05/13 1310 07/06/13 0417 07/07/13 0515  WBC 11.2* 7.1 17.5*  HGB 14.7 14.1 12.1*  HCT 44.3 42.1 36.5*  PLT 226 203 286   BMET  Recent Labs  07/05/13 1310 07/06/13 0417 07/07/13 0515  NA  138 135 125*  K 3.9 4.2 4.3  CL 103 102 90*  CO2 25 23 25   GLUCOSE 98 124* 115*  BUN 15 7 7   CREATININE 0.39* 0.45* 0.62  CALCIUM 9.1 8.9 8.8   LFT  Recent Labs  07/07/13 0515  PROT 6.7  ALBUMIN 3.0*  AST 127*  ALT 378*  ALKPHOS 130*  BILITOT 5.4*   PT/INR No results found for this basename: LABPROT, INR,  in the last 72 hours Hepatitis Panel No results found for this basename: HEPBSAG, HCVAB, HEPAIGM, HEPBIGM,  in the last 72 hours C-Diff   Studies/Results: Ct Abdomen Pelvis Wo Contrast  07/05/2013   CLINICAL DATA:  Back pain with hematuria. History of spina bifida and back surgery.  EXAM: CT ABDOMEN AND PELVIS WITHOUT CONTRAST  TECHNIQUE: Multidetector CT imaging of the abdomen and pelvis was performed following the standard protocol without intravenous contrast.  COMPARISON:  Abdominal pelvic CT 11/23/2006.  FINDINGS: The visualized lung bases are clear. There is no significant pleural or pericardial effusion.  The intra-abdominal anatomy is distorted by the spinal deformities. As evaluated in the noncontrast study, the liver, spleen and adrenal glands appear normal. The pancreas demonstrates fatty replacement but no change or acute abnormality.  There is progressive irregular thickening of the gallbladder  walls with adjacent soft tissue stranding suspicious for cholecystitis. There is a small central calcified gallstone. No significantly dilatation is seen.  Both kidneys appear normal. There is no hydronephrosis or urinary tract calcification. The bladder is trabeculated and the thick-walled and protrudes anteriorly over the symphysis pubis, similar to the prior study. The central density of the bladder contents is increased compared with the prior examination. The prostate gland and seminal vesicles appear normal.  No acute bowel findings identified. There is probable chronic prolapse of the rectum and bladder base into the perineum. Ventriculoperitoneal shunt is noted without focal fluid collection adjacent to its tip in the right lower quadrant. The anterior abdominal wall is diffusely thinned without focal recurrent hernia.  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 Some heterotopic ossification posterior to the right femoral greater trochanter has mildly increased. No acute osseous findings are seen.  IMPRESSION: 1. Progressive gallbladder wall thickening and surrounding inflammatory change suspicious for chronic cholecystitis. Underlying gallstones are noted. 2. Increased density within the bladder lumen may be related to hematuria or urinary tract infection. The bladder appears thick-walled and trabeculated. A bladder mass cannot be excluded. There is no evidence of urinary tract calculus or hydronephrosis. 3. Chronic findings related to spina bifida and prior lumbar fusion.   Electronically Signed   By: Roxy Horseman M.D.   On: 07/05/2013 14:12   Dg Chest Port 1 View  07/05/2013   CLINICAL DATA:  Back pain, history of spina bifida  EXAM: PORTABLE CHEST - 1 VIEW  COMPARISON:  Concurrently obtained CT scan of the abdomen and pelvis  FINDINGS: Borderline nondiagnostic chest radiograph secondary to severe kyphotic patient positioning and body  habitus. There are very low inspiratory volumes. Incompletely imaged posterior spinal stabilization hardware and right-sided ventriculoperitoneal shunt catheter. Correlation with the concurrently obtained is CT scan of the abdomen and pelvis demonstrates no significant airspace disease in the lung bases which are the portion of the lung most limited on this radiograph. The upper and a peripheral aspect of the lungs appear clear.  IMPRESSION: Very limited chest x-ray secondary to patient body habitus and positioning. No definite acute cardiopulmonary process.  New  The central and inferior aspect of the chest image is essentially non diagnostic. However, correlation with the concurrently obtained CT scan of the abdomen and pelvis including the lung bases demonstrates no acute abnormality in this region.   Electronically Signed   By: Malachy Moan M.D.   On: 07/05/2013 13:44    Impression/Plan;     #38 32 year old male with spina bifida and paraplegia status post laparoscopic cholecystectomy yesterday for acute cholecystitis with perforated gallbladder. No evidence for ductal dilation on noncontrasted CT, patient does have elevated LFTs. Cannot rule out common bile duct stone versus elevated LFTs secondary to severe cholecystitis and secondary inflammatory changes  #2 neurogenic bladder #3 status post spinal surgery remote with Harrington rods #4 ventriculoperitoneal shunt in place  Plan: Cannot perform MRCP secondary to steel rods Will plan to trend LFTs over the next 24-48 hours and then decide if ERCP is necessary. Plans were discussed with the patient and his mother. Will resume diet in the interim.   Amy Esterwood  07/07/2013, 9:54 AM     Attending physician's note   I have taken a history, examined the patient and reviewed the chart. I agree with the Advanced Practitioner's note, impression and recommendations. Acute cholecystitis with perforation and adhesions which complicated his lap  chole. IOC was not performed and a Blake drain was placed. LFTs/t bili elevated at admission. No biliary dilation noted on CT. T bili up and transaminases/alk phos down today. Trend LFTs - if they do not continue trending toward normal proceed with abd [place] to evaluate for biliary dilation and consider ERCP to R/O choledocholithiasis. MRCP precluded with Harrington rods.  Meryl Dare, MD [facility]

## 2013-07-07 NOTE — Progress Notes (Signed)
1 Day Post-Op  Subjective: Sore.  No nausea.  Objective: Vital signs in last 24 hours: Temp:  [96.9 F (36.1 C)-98.4 F (36.9 C)] 98 F (36.7 C) (10/26 0618) Pulse Rate:  [72-123] 116 (10/26 0618) Resp:  [15-24] 18 (10/26 0618) BP: (94-145)/(50-83) 111/75 mmHg (10/26 0618) SpO2:  [98 %-100 %] 98 % (10/26 0618) Weight:  [144 lb 2.9 oz (65.4 kg)] 144 lb 2.9 oz (65.4 kg) (10/25 2041)    Intake/Output from previous day: 10/25 0701 - 10/26 0700 In: 3385.8 [P.O.:240; I.V.:2945.8; IV Piggyback:200] Out: 2127 [Urine:2005; Drains:122] Intake/Output this shift:    PE: General- In NAD Abdomen-soft, distended, dressings dry, serosanguinous drain output  Lab Results:   Recent Labs  07/06/13 0417 07/07/13 0515  WBC 7.1 17.5*  HGB 14.1 12.1*  HCT 42.1 36.5*  PLT 203 286   BMET  Recent Labs  07/06/13 0417 07/07/13 0515  NA 135 125*  K 4.2 4.3  CL 102 90*  CO2 23 25  GLUCOSE 124* 115*  BUN 7 7  CREATININE 0.45* 0.62  CALCIUM 8.9 8.8   PT/INR No results found for this basename: LABPROT, INR,  in the last 72 hours Comprehensive Metabolic Panel:    Component Value Date/Time   NA 125* 07/07/2013 0515   K 4.3 07/07/2013 0515   CL 90* 07/07/2013 0515   CO2 25 07/07/2013 0515   BUN 7 07/07/2013 0515   CREATININE 0.62 07/07/2013 0515   GLUCOSE 115* 07/07/2013 0515   CALCIUM 8.8 07/07/2013 0515   AST 127* 07/07/2013 0515   ALT 378* 07/07/2013 0515   ALKPHOS 130* 07/07/2013 0515   BILITOT 5.4* 07/07/2013 0515   PROT 6.7 07/07/2013 0515   ALBUMIN 3.0* 07/07/2013 0515     Studies/Results: Ct Abdomen Pelvis Wo Contrast  07/05/2013   CLINICAL DATA:  Back pain with hematuria. History of spina bifida and back surgery.  EXAM: CT ABDOMEN AND PELVIS WITHOUT CONTRAST  TECHNIQUE: Multidetector CT imaging of the abdomen and pelvis was performed following the standard protocol without intravenous contrast.  COMPARISON:  Abdominal pelvic CT 11/23/2006.  FINDINGS: The visualized  lung bases are clear. There is no significant pleural or pericardial effusion.  The intra-abdominal anatomy is distorted by the spinal deformities. As evaluated in the noncontrast study, the liver, spleen and adrenal glands appear normal. The pancreas demonstrates fatty replacement but no change or acute abnormality.  There is progressive irregular thickening of the gallbladder walls with adjacent soft tissue stranding suspicious for cholecystitis. There is a small central calcified gallstone. No significantly dilatation is seen.  Both kidneys appear normal. There is no hydronephrosis or urinary tract calcification. The bladder is trabeculated and the thick-walled and protrudes anteriorly over the symphysis pubis, similar to the prior study. The central density of the bladder contents is increased compared with the prior examination. The prostate gland and seminal vesicles appear normal.  No acute bowel findings identified. There is probable chronic prolapse of the rectum and bladder base into the perineum. Ventriculoperitoneal shunt is noted without focal fluid collection adjacent to its tip in the right lower quadrant. The anterior abdominal wall is diffusely thinned without focal recurrent hernia.  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 Some heterotopic ossification posterior to the right femoral greater trochanter has mildly increased. No acute osseous findings are seen.  IMPRESSION: 1. Progressive gallbladder wall thickening and surrounding inflammatory change suspicious for chronic cholecystitis. Underlying gallstones are noted. 2. Increased density  within the bladder lumen may be related to hematuria or urinary tract infection. The bladder appears thick-walled and trabeculated. A bladder mass cannot be excluded. There is no evidence of urinary tract calculus or hydronephrosis. 3. Chronic findings related to spina bifida and prior lumbar fusion.    Electronically Signed   By: Roxy Horseman M.D.   On: 07/05/2013 14:12   Dg Chest Port 1 View  07/05/2013   CLINICAL DATA:  Back pain, history of spina bifida  EXAM: PORTABLE CHEST - 1 VIEW  COMPARISON:  Concurrently obtained CT scan of the abdomen and pelvis  FINDINGS: Borderline nondiagnostic chest radiograph secondary to severe kyphotic patient positioning and body habitus. There are very low inspiratory volumes. Incompletely imaged posterior spinal stabilization hardware and right-sided ventriculoperitoneal shunt catheter. Correlation with the concurrently obtained is CT scan of the abdomen and pelvis demonstrates no significant airspace disease in the lung bases which are the portion of the lung most limited on this radiograph. The upper and a peripheral aspect of the lungs appear clear.  IMPRESSION: Very limited chest x-ray secondary to patient body habitus and positioning. No definite acute cardiopulmonary process. New  The central and inferior aspect of the chest image is essentially non diagnostic. However, correlation with the concurrently obtained CT scan of the abdomen and pelvis including the lung bases demonstrates no acute abnormality in this region.   Electronically Signed   By: Malachy Moan M.D.   On: 07/05/2013 13:44    Anti-infectives: Anti-infectives   Start     Dose/Rate Route Frequency Ordered Stop   07/05/13 1800  piperacillin-tazobactam (ZOSYN) IVPB 3.375 g     3.375 g 12.5 mL/hr over 240 Minutes Intravenous Every 8 hours 07/05/13 1637        Assessment Principal Problem:   Acute cholecystitis with gallbladder perforation and abscess-s/p subtotal cholecystectomy; unable to do IOC so cannot tell if he has a CBD stone; bilirubin up, other LFTs trending down;  Active Problems:   UTI (lower urinary tract infection)   Spina bifida   Neurogenic bladder-foley in    LOS: 2 days   Plan: Continue IV abxs.  GI consult for ? ERCP.   Israa Caban J 07/07/2013

## 2013-07-08 ENCOUNTER — Encounter (HOSPITAL_COMMUNITY)
Admission: EM | Disposition: A | Discharge status: Other Acute Inpatient Hospital | Payer: Self-pay | Source: Home / Self Care

## 2013-07-08 ENCOUNTER — Inpatient Hospital Stay (HOSPITAL_COMMUNITY): Payer: PRIVATE HEALTH INSURANCE

## 2013-07-08 ENCOUNTER — Encounter (HOSPITAL_COMMUNITY): Payer: Self-pay | Admitting: General Surgery

## 2013-07-08 ENCOUNTER — Inpatient Hospital Stay (HOSPITAL_COMMUNITY): Payer: PRIVATE HEALTH INSURANCE | Admitting: Anesthesiology

## 2013-07-08 ENCOUNTER — Encounter (HOSPITAL_COMMUNITY): Payer: PRIVATE HEALTH INSURANCE | Admitting: Anesthesiology

## 2013-07-08 DIAGNOSIS — K810 Acute cholecystitis: Secondary | ICD-10-CM

## 2013-07-08 DIAGNOSIS — K631 Perforation of intestine (nontraumatic): Secondary | ICD-10-CM

## 2013-07-08 DIAGNOSIS — S31109A Unspecified open wound of abdominal wall, unspecified quadrant without penetration into peritoneal cavity, initial encounter: Secondary | ICD-10-CM

## 2013-07-08 DIAGNOSIS — R000 Tachycardia, unspecified: Secondary | ICD-10-CM

## 2013-07-08 DIAGNOSIS — R7989 Other specified abnormal findings of blood chemistry: Secondary | ICD-10-CM

## 2013-07-08 DIAGNOSIS — R0682 Tachypnea, not elsewhere classified: Secondary | ICD-10-CM

## 2013-07-08 DIAGNOSIS — K8020 Calculus of gallbladder without cholecystitis without obstruction: Secondary | ICD-10-CM

## 2013-07-08 DIAGNOSIS — A419 Sepsis, unspecified organism: Secondary | ICD-10-CM

## 2013-07-08 DIAGNOSIS — J9600 Acute respiratory failure, unspecified whether with hypoxia or hypercapnia: Secondary | ICD-10-CM | POA: Diagnosis not present

## 2013-07-08 HISTORY — PX: COLON RESECTION: SHX5231

## 2013-07-08 LAB — CBC
HCT: 31.2 % — ABNORMAL LOW (ref 39.0–52.0)
Hemoglobin: 10.1 g/dL — ABNORMAL LOW (ref 13.0–17.0)
Platelets: 285 10*3/uL (ref 150–400)
RDW: 13.4 % (ref 11.5–15.5)
WBC: 12.4 10*3/uL — ABNORMAL HIGH (ref 4.0–10.5)

## 2013-07-08 LAB — BLOOD GAS, ARTERIAL
Acid-base deficit: 12 mmol/L — ABNORMAL HIGH (ref 0.0–2.0)
Bicarbonate: 18.3 mEq/L — ABNORMAL LOW (ref 20.0–24.0)
Bicarbonate: 16.8 mEq/L — ABNORMAL LOW (ref 20.0–24.0)
FIO2: 1 %
MECHVT: 0.53 mL
PEEP: 5 cmH2O
Patient temperature: 37
TCO2: 17.7 mmol/L (ref 0–100)
TCO2: 14.9 mmol/L (ref 0–100)
pCO2 arterial: 61.3 mmHg (ref 35.0–45.0)
pCO2 arterial: 34.9 mmHg — ABNORMAL LOW (ref 35.0–45.0)
pH, Arterial: 7.103 — CL (ref 7.350–7.450)
pH, Arterial: 7.304 — ABNORMAL LOW (ref 7.350–7.450)
pO2, Arterial: 79.5 mmHg — ABNORMAL LOW (ref 80.0–100.0)

## 2013-07-08 LAB — MRSA PCR SCREENING: MRSA by PCR: INVALID — AB

## 2013-07-08 LAB — COMPREHENSIVE METABOLIC PANEL
AST: 44 U/L — ABNORMAL HIGH (ref 0–37)
Albumin: 2.6 g/dL — ABNORMAL LOW (ref 3.5–5.2)
Alkaline Phosphatase: 104 U/L (ref 39–117)
BUN: 10 mg/dL (ref 6–23)
Chloride: 94 mEq/L — ABNORMAL LOW (ref 96–112)
GFR calc non Af Amer: >90 mL/min (ref >90)
Potassium: 4.8 mEq/L (ref 3.5–5.1)
Total Bilirubin: 2 mg/dL — ABNORMAL HIGH (ref 0.3–1.2)

## 2013-07-08 LAB — PREPARE RBC (CROSSMATCH)

## 2013-07-08 LAB — ABO/RH: ABO/RH(D): O POS

## 2013-07-08 SURGERY — COLECTOMY, HAND-ASSISTED, LAPAROSCOPIC
Anesthesia: General | Site: Abdomen | Wound class: Dirty or Infected

## 2013-07-08 MED ORDER — PHENYLEPHRINE HCL 10 MG/ML IJ SOLN
30.0000 ug/min | INTRAVENOUS | Status: DC
  Administered 2013-07-09: 160 ug/min via INTRAVENOUS
  Filled 2013-07-08 (×3): qty 1

## 2013-07-08 MED ORDER — PHENYLEPHRINE HCL 10 MG/ML IJ SOLN
10.0000 mg | INTRAVENOUS | Status: DC | PRN
  Administered 2013-07-08: 50 ug/min via INTRAVENOUS

## 2013-07-08 MED ORDER — LIDOCAINE-EPINEPHRINE 1 %-1:100000 IJ SOLN
INTRAMUSCULAR | Status: AC
  Filled 2013-07-08: qty 1

## 2013-07-08 MED ORDER — PANTOPRAZOLE SODIUM 40 MG IV SOLR
40.0000 mg | Freq: Every day | INTRAVENOUS | Status: DC
  Administered 2013-07-08 – 2013-07-09 (×2): 40 mg via INTRAVENOUS
  Filled 2013-07-08 (×3): qty 40

## 2013-07-08 MED ORDER — FENTANYL CITRATE 0.05 MG/ML IJ SOLN
INTRAMUSCULAR | Status: AC
  Filled 2013-07-08: qty 2

## 2013-07-08 MED ORDER — SODIUM CHLORIDE 0.9 % IV SOLN
INTRAVENOUS | Status: DC
  Administered 2013-07-08: 1000 mL via INTRAVENOUS
  Administered 2013-07-08: 20 mL/h via INTRAVENOUS

## 2013-07-08 MED ORDER — SODIUM BICARBONATE 8.4 % IV SOLN
INTRAVENOUS | Status: AC
  Filled 2013-07-08: qty 50

## 2013-07-08 MED ORDER — BUPIVACAINE HCL (PF) 0.25 % IJ SOLN
INTRAMUSCULAR | Status: AC
  Filled 2013-07-08: qty 30

## 2013-07-08 MED ORDER — FENTANYL CITRATE 0.05 MG/ML IJ SOLN
25.0000 ug | INTRAMUSCULAR | Status: DC | PRN
  Administered 2013-07-08: 50 ug via INTRAVENOUS
  Administered 2013-07-08: 100 ug via INTRAVENOUS
  Administered 2013-07-08 – 2013-07-09 (×6): 50 ug via INTRAVENOUS
  Filled 2013-07-08 (×4): qty 2

## 2013-07-08 MED ORDER — HEPARIN SODIUM (PORCINE) 5000 UNIT/ML IJ SOLN
5000.0000 [IU] | Freq: Three times a day (TID) | INTRAMUSCULAR | Status: DC
  Administered 2013-07-09: 5000 [IU] via SUBCUTANEOUS
  Filled 2013-07-08 (×4): qty 1

## 2013-07-08 MED ORDER — 0.9 % SODIUM CHLORIDE (POUR BTL) OPTIME
TOPICAL | Status: DC | PRN
  Administered 2013-07-08: 2000 mL

## 2013-07-08 MED ORDER — IOHEXOL 350 MG/ML SOLN
100.0000 mL | Freq: Once | INTRAVENOUS | Status: AC | PRN
  Administered 2013-07-08: 100 mL via INTRAVENOUS

## 2013-07-08 MED ORDER — SODIUM CHLORIDE 0.9 % IR SOLN
Status: DC | PRN
  Administered 2013-07-08: 20:00:00

## 2013-07-08 MED ORDER — ETOMIDATE 2 MG/ML IV SOLN
0.3000 mg/kg | Freq: Once | INTRAVENOUS | Status: DC
  Filled 2013-07-08: qty 13.52

## 2013-07-08 MED ORDER — MIDAZOLAM HCL 2 MG/2ML IJ SOLN
2.0000 mg | Freq: Once | INTRAMUSCULAR | Status: AC
  Administered 2013-07-08 (×2): 2 mg via INTRAVENOUS

## 2013-07-08 MED ORDER — MIDAZOLAM HCL 2 MG/2ML IJ SOLN
INTRAMUSCULAR | Status: AC
  Administered 2013-07-08: 2 mg via INTRAVENOUS
  Filled 2013-07-08: qty 2

## 2013-07-08 MED ORDER — LACTATED RINGERS IV SOLN
INTRAVENOUS | Status: DC | PRN
  Administered 2013-07-08 (×6): via INTRAVENOUS

## 2013-07-08 MED ORDER — ALBUMIN HUMAN 5 % IV SOLN
INTRAVENOUS | Status: DC | PRN
  Administered 2013-07-08: 18:00:00 via INTRAVENOUS

## 2013-07-08 MED ORDER — MIDAZOLAM HCL 2 MG/2ML IJ SOLN
2.0000 mg | INTRAMUSCULAR | Status: DC | PRN
  Administered 2013-07-08: 2 mg via INTRAVENOUS
  Filled 2013-07-08: qty 2

## 2013-07-08 MED ORDER — FENTANYL CITRATE 0.05 MG/ML IJ SOLN
INTRAMUSCULAR | Status: AC
  Administered 2013-07-08: 100 ug
  Filled 2013-07-08: qty 2

## 2013-07-08 MED ORDER — PIPERACILLIN-TAZOBACTAM 3.375 G IVPB
INTRAVENOUS | Status: AC
  Filled 2013-07-08: qty 50

## 2013-07-08 MED ORDER — MIDAZOLAM HCL 2 MG/2ML IJ SOLN
INTRAMUSCULAR | Status: AC
  Filled 2013-07-08: qty 2

## 2013-07-08 MED ORDER — NOREPINEPHRINE BITARTRATE 1 MG/ML IJ SOLN
2.0000 ug/min | INTRAMUSCULAR | Status: DC
  Administered 2013-07-08: 20 ug/min via INTRAVENOUS
  Administered 2013-07-08: 26 ug via INTRAVENOUS
  Administered 2013-07-08: 30 ug/min via INTRAVENOUS
  Administered 2013-07-08: 26 ug via INTRAVENOUS
  Administered 2013-07-09: 30 ug/min via INTRAVENOUS
  Filled 2013-07-08 (×6): qty 4

## 2013-07-08 MED ORDER — ARTIFICIAL TEARS OP OINT
TOPICAL_OINTMENT | OPHTHALMIC | Status: DC | PRN
  Administered 2013-07-09: 1 via OPHTHALMIC
  Filled 2013-07-08: qty 3.5

## 2013-07-08 MED ORDER — LACTATED RINGERS IV SOLN
INTRAVENOUS | Status: DC | PRN
  Administered 2013-07-08: 21:00:00 via INTRAVENOUS

## 2013-07-08 MED ORDER — SODIUM BICARBONATE 4.2 % IV SOLN
INTRAVENOUS | Status: DC | PRN
  Administered 2013-07-08: 25 meq via INTRAVENOUS
  Administered 2013-07-08: 50 meq via INTRAVENOUS
  Administered 2013-07-08: 25 meq via INTRAVENOUS

## 2013-07-08 MED ORDER — ROCURONIUM BROMIDE 100 MG/10ML IV SOLN
INTRAVENOUS | Status: DC | PRN
  Administered 2013-07-08: 100 mg via INTRAVENOUS
  Administered 2013-07-08 (×2): 50 mg via INTRAVENOUS

## 2013-07-08 MED ORDER — ETOMIDATE 2 MG/ML IV SOLN: 20.0000 mg | Freq: Once | INTRAVENOUS | Status: DC

## 2013-07-08 MED ORDER — KCL IN DEXTROSE-NACL 20-5-0.45 MEQ/L-%-% IV SOLN
INTRAVENOUS | Status: DC | PRN
  Administered 2013-07-08: 17:00:00 via INTRAVENOUS

## 2013-07-08 MED ORDER — IOHEXOL 300 MG/ML  SOLN
25.0000 mL | INTRAMUSCULAR | Status: AC
  Administered 2013-07-08 (×2): 25 mL via ORAL

## 2013-07-08 MED ORDER — LACTATED RINGERS IR SOLN
Status: DC | PRN
  Administered 2013-07-08: 1000 mL

## 2013-07-08 MED ORDER — SODIUM CHLORIDE 0.9 % IV BOLUS (SEPSIS)
1000.0000 mL | Freq: Once | INTRAVENOUS | Status: AC
  Administered 2013-07-08: 1000 mL via INTRAVENOUS

## 2013-07-08 MED ORDER — ALBUMIN HUMAN 5 % IV SOLN
INTRAVENOUS | Status: AC
  Filled 2013-07-08: qty 250

## 2013-07-08 MED ORDER — HYDROMORPHONE HCL PF 1 MG/ML IJ SOLN
0.2500 mg | INTRAMUSCULAR | Status: DC | PRN
  Administered 2013-07-09: 0.5 mg via INTRAVENOUS
  Filled 2013-07-08: qty 1

## 2013-07-08 MED ORDER — LACTATED RINGERS IV SOLN
INTRAVENOUS | Status: DC | PRN
  Administered 2013-07-08 (×3): via INTRAVENOUS

## 2013-07-08 MED ORDER — PHENYLEPHRINE HCL 10 MG/ML IJ SOLN
INTRAMUSCULAR | Status: DC | PRN
  Administered 2013-07-08 (×2): 80 ug via INTRAVENOUS

## 2013-07-08 MED ORDER — SODIUM CHLORIDE 0.9 % IV SOLN
INTRAVENOUS | Status: DC
  Administered 2013-07-08: 500 mL/h via INTRAVENOUS
  Administered 2013-07-08 (×2): 20 mL/h via INTRAVENOUS
  Administered 2013-07-08: 500 mL/h via INTRAVENOUS
  Administered 2013-07-08 (×2): via INTRAVENOUS
  Administered 2013-07-08: 999 mL/h via INTRAVENOUS
  Administered 2013-07-08 – 2013-07-09 (×2): 20 mL/h via INTRAVENOUS

## 2013-07-08 SURGICAL SUPPLY — 87 items
APPLIER CLIP 11 MED OPEN (CLIP) ×2
APPLIER CLIP 5 13 M/L LIGAMAX5 (MISCELLANEOUS) ×2
APPLIER CLIP ROT 10 11.4 M/L (STAPLE) ×2
APR CLP MED 11 20 MLT OPN (CLIP) ×1
APR CLP MED LRG 11.4X10 (STAPLE) ×1
APR CLP MED LRG 5 ANG JAW (MISCELLANEOUS) ×1
BAG URINE DRAINAGE (UROLOGICAL SUPPLIES) IMPLANT
BAG URO CATCHER STRL LF (DRAPE) IMPLANT
BLADE EXTENDED COATED 6.5IN (ELECTRODE) ×2 IMPLANT
BLADE HEX COATED 2.75 (ELECTRODE) ×2 IMPLANT
BLADE SURG SZ10 CARB STEEL (BLADE) ×2 IMPLANT
CABLE HIGH FREQUENCY MONO STRZ (ELECTRODE) ×2 IMPLANT
CANISTER SUCTION 2500CC (MISCELLANEOUS) ×2 IMPLANT
CATH FOLEY SILVER 30CC 28FR (CATHETERS) IMPLANT
CELLS DAT CNTRL 66122 CELL SVR (MISCELLANEOUS) IMPLANT
CLIP APPLIE 11 MED OPEN (CLIP) IMPLANT
CLIP APPLIE 5 13 M/L LIGAMAX5 (MISCELLANEOUS) ×1 IMPLANT
CLIP APPLIE ROT 10 11.4 M/L (STAPLE) ×1 IMPLANT
CLOTH BEACON ORANGE TIMEOUT ST (SAFETY) ×2 IMPLANT
COVER MAYO STAND STRL (DRAPES) ×2 IMPLANT
DECANTER SPIKE VIAL GLASS SM (MISCELLANEOUS) ×2 IMPLANT
DRAIN CHANNEL 19F RND (DRAIN) ×2 IMPLANT
DRAIN PENROSE 18X1/4 LTX STRL (WOUND CARE) ×1 IMPLANT
DRAPE LAPAROSCOPIC ABDOMINAL (DRAPES) ×2 IMPLANT
DRAPE LG THREE QUARTER DISP (DRAPES) ×2 IMPLANT
DRAPE WARM FLUID 44X44 (DRAPE) ×4 IMPLANT
ELECT BLADE TIP CTD 4 INCH (ELECTRODE) ×1 IMPLANT
ELECT REM PT RETURN 9FT ADLT (ELECTROSURGICAL) ×2
ELECTRODE REM PT RTRN 9FT ADLT (ELECTROSURGICAL) ×1 IMPLANT
EVACUATOR SILICONE 100CC (DRAIN) ×1 IMPLANT
FILTER SMOKE EVAC LAPAROSHD (FILTER) IMPLANT
GLOVE BIOGEL PI IND STRL 7.0 (GLOVE) ×1 IMPLANT
GLOVE BIOGEL PI INDICATOR 7.0 (GLOVE) ×1
GLOVE ECLIPSE 8.0 STRL XLNG CF (GLOVE) ×4 IMPLANT
GLOVE INDICATOR 8.0 STRL GRN (GLOVE) ×2 IMPLANT
GOWN PREVENTION PLUS LG XLONG (DISPOSABLE) ×2 IMPLANT
GOWN STRL REIN XL XLG (GOWN DISPOSABLE) ×4 IMPLANT
HEMOSTAT SURGICEL 4X8 (HEMOSTASIS) ×1 IMPLANT
KIT BASIN OR (CUSTOM PROCEDURE TRAY) ×2 IMPLANT
LEGGING LITHOTOMY PAIR STRL (DRAPES) IMPLANT
LIGASURE IMPACT 36 18CM CVD LR (INSTRUMENTS) ×1 IMPLANT
NS IRRIG 1000ML POUR BTL (IV SOLUTION) ×2 IMPLANT
PENCIL BUTTON HOLSTER BLD 10FT (ELECTRODE) ×2 IMPLANT
PUMP PAIN ON-Q (MISCELLANEOUS) ×2 IMPLANT
RELOAD PROXIMATE TA60MM BLUE (ENDOMECHANICALS) ×2 IMPLANT
RELOAD STAPLE 60 BLU REG PROX (ENDOMECHANICALS) IMPLANT
RETRACTOR WND ALEXIS 18 MED (MISCELLANEOUS) IMPLANT
RTRCTR WOUND ALEXIS 18CM MED (MISCELLANEOUS)
SCALPEL HARMONIC ACE (MISCELLANEOUS) IMPLANT
SCISSORS LAP 5X35 DISP (ENDOMECHANICALS) ×2 IMPLANT
SET IRRIG TUBING LAPAROSCOPIC (IRRIGATION / IRRIGATOR) ×2 IMPLANT
SLEEVE SURGEON STRL (DRAPES) ×4 IMPLANT
SLEEVE Z-THREAD 5X100MM (TROCAR) ×2 IMPLANT
SOLUTION ANTI FOG 6CC (MISCELLANEOUS) ×2 IMPLANT
SPONGE ABDOMINAL VAC ABTHERA (MISCELLANEOUS) ×1 IMPLANT
SPONGE GAUZE 4X4 12PLY (GAUZE/BANDAGES/DRESSINGS) ×2 IMPLANT
SPONGE LAP 18X18 X RAY DECT (DISPOSABLE) ×6 IMPLANT
STAPLER GUN LINEAR PROX 60 (STAPLE) ×1 IMPLANT
STAPLER VISISTAT 35W (STAPLE) ×2 IMPLANT
SUCTION POOLE TIP (SUCTIONS) ×2 IMPLANT
SUT ETHILON 2 0 PS N (SUTURE) ×1 IMPLANT
SUT PDS AB 1 CTX 36 (SUTURE) ×4 IMPLANT
SUT PDS AB 1 TP1 96 (SUTURE) IMPLANT
SUT PROLENE 2 0 KS (SUTURE) IMPLANT
SUT PROLENE 2 0 SH DA (SUTURE) ×1 IMPLANT
SUT SILK 2 0 (SUTURE) ×2
SUT SILK 2 0 SH CR/8 (SUTURE) ×2 IMPLANT
SUT SILK 2-0 18XBRD TIE 12 (SUTURE) ×1 IMPLANT
SUT SILK 3 0 (SUTURE) ×2
SUT SILK 3 0 SH CR/8 (SUTURE) ×2 IMPLANT
SUT SILK 3-0 18XBRD TIE 12 (SUTURE) ×1 IMPLANT
SUT VIC AB 2-0 SH 18 (SUTURE) ×4 IMPLANT
SUT VICRYL 2 0 18  UND BR (SUTURE) ×2
SUT VICRYL 2 0 18 UND BR (SUTURE) ×2 IMPLANT
SYR 30ML LL (SYRINGE) IMPLANT
SYRINGE IRR TOOMEY STRL 70CC (SYRINGE) IMPLANT
SYS LAPSCP GELPORT 120MM (MISCELLANEOUS)
SYSTEM LAPSCP GELPORT 120MM (MISCELLANEOUS) IMPLANT
TOWEL OR 17X26 10 PK STRL BLUE (TOWEL DISPOSABLE) ×5 IMPLANT
TRAY FOLEY CATH 14FRSI W/METER (CATHETERS) ×2 IMPLANT
TRAY LAP CHOLE (CUSTOM PROCEDURE TRAY) ×2 IMPLANT
TROCAR BLADELESS OPT 5 100 (ENDOMECHANICALS) ×4 IMPLANT
TROCAR XCEL NON-BLD 11X100MML (ENDOMECHANICALS) ×2 IMPLANT
TROCAR XCEL UNIV SLVE 11M 100M (ENDOMECHANICALS) IMPLANT
TUBING FILTER THERMOFLATOR (ELECTROSURGICAL) ×2 IMPLANT
YANKAUER SUCT BULB TIP 10FT TU (MISCELLANEOUS) ×3 IMPLANT
YANKAUER SUCT BULB TIP NO VENT (SUCTIONS) ×2 IMPLANT

## 2013-07-08 NOTE — Procedures (Signed)
Intubation Procedure Note JEZREEL JUSTINIANO [id number] Jul 21, 1981  Procedure: Intubation Indications: Respiratory insufficiency  Procedure Details Consent: Risks of procedure as well as the alternatives and risks of each were explained to the (patient/caregiver).  Consent for procedure obtained. Time Out: Verified patient identification, verified procedure, site/side was marked, verified correct patient position, special equipment/implants available, medications/allergies/relevent history reviewed, required imaging and test results available.  Performed  MAC and 3 Medications:  Fentanyl 100 mcg Etomidate 20 mg Versed 2 mg NMB    Evaluation Hemodynamic Status: Transient hypotension treated with fluid; O2 sats: stable throughout Patient's Current Condition: stable Complications: No apparent complications Patient did tolerate procedure well. Chest X-ray ordered to verify placement.  CXR: pending. Tube placed to 22 cm at lip.  Brett Canales Minor ACNP [facility] Pager [phone] till 3 pm If no answer page [phone] 07/08/2013, 1:08 PM  Levy Pupa, MD, PhD 07/09/2013, 8:43 AM [facility] [phone] or if no answer [phone]

## 2013-07-08 NOTE — Procedures (Signed)
Central Venous Catheter Insertion (Cordis) Procedure Note [name] [id number] 16-Nov-1980  Procedure: Insertion of Central Venous Catheter Indications: Drug and/or fluid administration  Procedure Details Consent: Risks of procedure as well as the alternatives and risks of each were explained to the (patient/caregiver).  Consent for procedure obtained. Time Out: Verified patient identification, verified procedure, site/side was marked, verified correct patient position, special equipment/implants available, medications/allergies/relevent history reviewed, required imaging and test results available.  Performed  Maximum sterile technique was used including antiseptics, cap, gloves, gown, hand hygiene, mask and sheet. Skin prep: Chlorhexidine; local anesthetic administered A antimicrobial bonded/coated single lumen catheter was placed in the left internal jugular vein using the Seldinger technique. Ultrasound guidance used.yes Catheter placed to 16 cm. Blood aspirated via all 3 ports and then flushed x 3. Line sutured x 2 and dressing applied.  Evaluation Blood flow good Complications: No apparent complications Patient did tolerate procedure well. Chest X-ray ordered to verify placement.  CXR: pending.  Brett Canales Minor ACNP [name] PCC[facility] Pager [phone] till 3 pm If no answer page [phone] 07/08/2013, 11:53 AM  Levy Pupa, MD, PhD 07/08/2013, 12:44 PM [facility] [phone] or if no answer [phone]

## 2013-07-08 NOTE — Progress Notes (Signed)
Short, low volume breaths.  HR still 133.  Abdomen unchanged.  I am worried that he will require intubation soon.  I have asked Dr Delton Coombes of [facility] to help with his management.

## 2013-07-08 NOTE — Progress Notes (Signed)
[facility] Progress Note  Subjective:  Patient with sudden onset of tachypnea and tachycardia.  Transferred to step-down.  CT angio chest has been ordered to rule out PE and CT abdomen ordered due to distention and pain.  Getting central line due to infiltrated IV and hard stick.  Objective:  Vital signs in last 24 hours: Temp:  [97.3 F (36.3 C)-99.4 F (37.4 C)] 99.4 F (37.4 C) (10/27 0631) Pulse Rate:  [99-144] 132 (10/27 0631) Resp:  [20-32] 26 (10/27 0631) BP: (97-135)/(55-72) 135/67 mmHg (10/27 0631) SpO2:  [95 %-100 %] 97 % (10/27 0631) Last BM Date: 07/05/13 General:   Alert, uncomfortable Heart:  Tachy, but regular rhythm; no murmurs Pulm:  CTAB, but tachypneic.  Abdomen:  Very distended.  No bowel sounds heard.  Diffuse TTP.   Extremities:  Without edema. Neurologic:  Alert and oriented;  grossly normal neurologically.  Intake/Output from previous day: 10/26 0701 - 10/27 0700 In: 5850 [P.O.:1680; I.V.:4000; IV Piggyback:150] Out: 2215 [Urine:2150; Drains:65]  Lab Results:  Recent Labs  07/06/13 0417 07/07/13 0515 07/08/13 0430  WBC 7.1 17.5* 12.4*  HGB 14.1 12.1* 10.1*  HCT 42.1 36.5* 31.2*  PLT 203 286 285   BMET  Recent Labs  07/06/13 0417 07/07/13 0515 07/08/13 0430  NA 135 125* 125*  K 4.2 4.3 4.8  CL 102 90* 94*  CO2 23 25 21   GLUCOSE 124* 115* 130*  BUN 7 7 10   CREATININE 0.45* 0.62 0.61  CALCIUM 8.9 8.8 8.2*   LFT  Recent Labs  07/08/13 0430  PROT 6.2  ALBUMIN 2.6*  AST 44*  ALT 207*  ALKPHOS 104  BILITOT 2.0*   Dg Chest Port 1 View  07/08/2013   CLINICAL DATA:  Left-sided chest pain  EXAM: PORTABLE CHEST - 1 VIEW  COMPARISON:  07/05/2013  FINDINGS: Cardiac shadow is stable. Postoperative changes are again seen. The lungs are clear  IMPRESSION: No acute abnormality noted.   Electronically Signed   By: Alcide Clever M.D.   On: 07/08/2013 07:14    Assessment / Plan: #3 32 year old male with spina bifida and  paraplegia status post laparoscopic cholecystectomy on 10/25 for acute cholecystitis with perforated gallbladder.  No evidence for ductal dilation on noncontrasted CT and LFT's are trending down. #2 Post-surgical tachycardia, tachypnea, and significant abdominal distention and pain.  CT angio chest and CT abdomen pending.  Further management per surgery. #3 Status post spinal surgery remotely with Harrington rods  #4 Ventriculoperitoneal shunt in place  #5 Hyponatremia:  Repletion by primary service.  -Will continue to follow LFT's, however, at this point there does not appear to be need for ERCP.    LOS: 3 days   ZEHR, JESSICA D.  07/08/2013, 8:48 AM  Pager number [phone]   GI ATTENDING  History, x-rays, and interval laboratories reviewed. Agree with H&P as outlined above. Mother in room. Await imaging studies. Continue to monitor liver tests and clinical progress.  Wilhemina Bonito. Eda Keys., M.D. [facility] Division of Gastroenterology

## 2013-07-08 NOTE — Progress Notes (Signed)
2 Days Post-Op  Subjective: C/o abdominal pain.  Overnight has had tachycardia and tachypneia  Objective: Vital signs in last 24 hours: Temp:  [97.3 F (36.3 C)-99.4 F (37.4 C)] 99.4 F (37.4 C) (10/27 0631) Pulse Rate:  [99-144] 132 (10/27 0631) Resp:  [20-32] 26 (10/27 0631) BP: (97-135)/(55-72) 135/67 mmHg (10/27 0631) SpO2:  [95 %-100 %] 97 % (10/27 0631)    Intake/Output from previous day: 10/26 0701 - 10/27 0700 In: 5850 [P.O.:1680; I.V.:4000; IV Piggyback:150] Out: 2215 [Urine:2150; Drains:65] Intake/Output this shift:    General appearance: alert, cooperative and no distress Resp: conversational dyspneia Cardio: tachycardia, regular GI: soft, diffuse tenderness, no peritonitis, JP with marroon output  Extremities: SCD's bilat  Lab Results:   Recent Labs  07/07/13 0515 07/08/13 0430  WBC 17.5* 12.4*  HGB 12.1* 10.1*  HCT 36.5* 31.2*  PLT 286 285   BMET  Recent Labs  07/07/13 0515 07/08/13 0430  NA 125* 125*  K 4.3 4.8  CL 90* 94*  CO2 25 21  GLUCOSE 115* 130*  BUN 7 10  CREATININE 0.62 0.61  CALCIUM 8.8 8.2*   PT/INR No results found for this basename: LABPROT, INR,  in the last 72 hours ABG No results found for this basename: PHART, PCO2, PO2, HCO3,  in the last 72 hours  Studies/Results: Dg Chest Port 1 View  07/08/2013   CLINICAL DATA:  Left-sided chest pain  EXAM: PORTABLE CHEST - 1 VIEW  COMPARISON:  07/05/2013  FINDINGS: Cardiac shadow is stable. Postoperative changes are again seen. The lungs are clear  IMPRESSION: No acute abnormality noted.   Electronically Signed   By: Alcide Clever M.D.   On: 07/08/2013 07:14    Anti-infectives: Anti-infectives   Start     Dose/Rate Route Frequency Ordered Stop   07/05/13 1800  piperacillin-tazobactam (ZOSYN) IVPB 3.375 g     3.375 g 12.5 mL/hr over 240 Minutes Intravenous Every 8 hours 07/05/13 1637        Assessment/Plan: s/p Procedure(s): LAPAROSCOPIC CHOLECYSTECTOMY WITH INTRAOPERATIVE  CHOLANGIOGRAM (N/A) with sudden tachycardia and dyspneia, I will plan for CT angio of chest and CT abdomen to evaluate for PE.  He does not have peritonitis but will also check CT abdomen.  Will transfer to stepdown and bolus fluid now since UOP is dark and tachycardic.  No neurologic symptoms to think infected shunt.  Wbc down.  His abdomen is distended and could be ileus as well which could cause distension and pulmonary problems.  NPO and consider NG depending on abdominal imaging.  LOS: 3 days    [name] 07/08/2013

## 2013-07-08 NOTE — Significant Event (Addendum)
Pt returned from OR after having partial colectomy, washout, and wound vac.  He is on pressors with levophed.  Will check CXR and ABG.  Adjusted medication list.  Coralyn Helling, MD 07/08/2013, 9:34 PM  ABG    Component Value Date/Time   PHART 7.103* 07/08/2013 2130   PCO2ART 61.3* 07/08/2013 2130   PO2ART 95.9 07/08/2013 2130   HCO3 18.3* 07/08/2013 2130   TCO2 17.7 07/08/2013 2130   ACIDBASEDEF 12.0* 07/08/2013 2130   O2SAT 95.8 07/08/2013 2130    Will increase RR.  Noted to have elevated peak pressure >> will adjust Vt to 8 cc/kg.  Coralyn Helling, MD 07/08/2013, 9:44 PM

## 2013-07-08 NOTE — Progress Notes (Signed)
To OR with Respiratory bagging. Levophed drip continued at 60mcg/kg/hr. Report given to [facility] CRNA.

## 2013-07-08 NOTE — Progress Notes (Signed)
Vitals this AM showed a pulse of 144 and respirations of 32.  Went and re-checked vitals about 15 minutes later as I had recently given morphine.  Pulse was down to 138 and respirations down to 24.  Pt stated that his pain 'was not hurting' him at this time.  All other vitals WDL, urine output sufficent, Hgb and WBCs were fine.  Paged Rosenbower, MD.  He said to give a dose of 2mg  of morphine to make sure it was not pain causing the high pulse and respirations.  Told to recheck vitals in an hour.  Will continue to monitor.  Sherron Monday

## 2013-07-08 NOTE — Anesthesia Postprocedure Evaluation (Addendum)
  Anesthesia Post-op Note  Patient: [name]  Procedure(s) Performed: Procedure(s) (LRB): EXPLORATORY LAPAROSCOPY, DIAGNOSTIC LAPAROSCOPY, PARTIAL COLECTOMY, ABDOMINAL WASHOUT, ABDOMINAL WOUND VAC (N/A)  Patient Location: ICU  Anesthesia Type: General  Level of Consciousness: sedated  Airway and Oxygen Therapy: Intubated and ventilated as was preoperatively.  Post-op Pain: not assessed.  Post-op Assessment: Critically ill on pressor support. Cardiac and pulmonary systems unstable. Care per CCM and CCS.  Last Vitals:  Filed Vitals:   07/08/13 2212  BP: 108/90  Pulse: 123  Temp: 35.6 C  Resp: 21    Post-op Vital Signs: unstable   Complications: No apparent anesthesia complications. Back to ICU intubated per plan. CCM was consulted earlier in the day.

## 2013-07-08 NOTE — Progress Notes (Signed)
He is now intubated.  HR 130. Still awaiting Ct abdomen and CT chest.  Drain foul smelling and maroon, not sure why he is having such foul smelling output from this drain.  Duodenum and colon appeared to be well away from the gallbladder during the procedure but bowel injury is a consideration.  This could also be from necrosis of remaining portion of gallbladder.

## 2013-07-08 NOTE — Transfer of Care (Signed)
Immediate Anesthesia Transfer of Care Note  Patient: [name]  Procedure(s) Performed: Procedure(s): EXPLORATORY LAPAROSCOPY, DIAGNOSTIC LAPAROSCOPY, PARTIAL COLECTOMY, ABDOMINAL WASHOUT, ABDOMINAL WOUND VAC (N/A)  Patient Location: ICU  Anesthesia Type:General  Level of Consciousness: sedated and unresponsive  Airway & Oxygen Therapy: Patient remains intubated per anesthesia plan and Patient placed on Ventilator (see vital sign flow sheet for setting)  Post-op Assessment: Report given to PACU RN and Post -op Vital signs reviewed and stable report given to ICU nurse  Post vital signs: stable  Complications: No apparent anesthesia complications

## 2013-07-08 NOTE — Significant Event (Signed)
Pt intubated earlier in the day.  Needing vent orders.  Vent orders entered.  Coralyn Helling, MD 07/08/2013, 3:07 PM

## 2013-07-08 NOTE — Progress Notes (Signed)
CT scan negative for PE but concerning for possible colonic perforation.  Trying to reach the mother to recommend emergent laparoscopy and laparotomy and possible partial colectomy and colostomy.

## 2013-07-08 NOTE — Progress Notes (Signed)
I spoke with the patient's mother and sister about the concern for colon leak.  I have recommended emergent exploration.  Will plan for diagnostic laparoscopy laparotomy, possible partial colectomy, and possible colostomy.  Consent obtained.

## 2013-07-08 NOTE — Anesthesia Preprocedure Evaluation (Addendum)
Anesthesia Evaluation  Patient identified by MRN, date of birth, ID band Patient awake    Reviewed: Allergy & Precautions, H&P , NPO status , Patient's Chart, lab work & pertinent test results  Airway Mallampati: II TM Distance: >3 FB Neck ROM: Full   Comment: Intubated earlier today. Dental no notable dental hx.    Pulmonary neg pulmonary ROS,  breath sounds clear to auscultation  Pulmonary exam normal       Cardiovascular hypertension, Pt. on medications Rhythm:Regular Rate:Normal     Neuro/Psych negative neurological ROS  negative psych ROS   GI/Hepatic Neg liver ROS, GERD-  Medicated,  Endo/Other  negative endocrine ROS  Renal/GU negative Renal ROS  negative genitourinary   Musculoskeletal negative musculoskeletal ROS (+)   Abdominal (+) + obese,   Peds negative pediatric ROS (+)  Hematology negative hematology ROS (+)   Anesthesia Other Findings   Reproductive/Obstetrics negative OB ROS                           Anesthesia Physical Anesthesia Plan  ASA: IV and emergent  Anesthesia Plan: General   Post-op Pain Management:    Induction: Intravenous  Airway Management Planned: Oral ETT  Additional Equipment:   Intra-op Plan:   Post-operative Plan: Post-operative intubation/ventilation  Informed Consent: I have reviewed the patients History and Physical, chart, labs and discussed the procedure including the risks, benefits and alternatives for the proposed anesthesia with the patient or authorized representative who has indicated his/her understanding and acceptance.     Plan Discussed with: CRNA  Anesthesia Plan Comments: (Intubated in ICU. Low blood pressure. CVC in place. Na 125. Hgb 10.1. Elevated LFTs. Levophed at 35 mcg/min. Vent. Settings: TV 450, FiO2 0.7 PEEP 5 RR 16. Last blood pressure 83/48. HR 125. O2Sat 100%.  Discussed critical condition with patient's  mother.  Will attempt arterial line placement.)      Anesthesia Quick Evaluation

## 2013-07-08 NOTE — Brief Op Note (Signed)
07/05/2013 - 07/08/2013  9:28 PM  PATIENT:  [name]  32 y.o. male  PRE-OPERATIVE DIAGNOSIS:  colon perforation  POST-OPERATIVE DIAGNOSIS:  colon perforation  PROCEDURE:  Procedure(s): EXPLORATORY LAPAROSCOPY, DIAGNOSTIC LAPAROSCOPY, PARTIAL COLECTOMY, ABDOMINAL WASHOUT, ABDOMINAL WOUND VAC (N/A)  SURGEON:  Surgeon(s) and Role:    * Lodema Pilot, DO - Primary    * Velora Heckler, MD - Assisting  PHYSICIAN ASSISTANT:   ASSISTANTS: Gerkin   ANESTHESIA:   general  EBL:  Total I/O In: 0454 [I.V.:5000; Blood:1051] Out: 1850 [Urine:1100; Blood:750]  BLOOD ADMINISTERED:3 units CC PRBC and 1 unit FFP  DRAINS: (27F ) Jackson-Pratt drain(s) with closed bulb suction in the GB fossa and abdominal wound vac  LOCAL MEDICATIONS USED:  NONE  SPECIMEN:  Source of Specimen:  transverse colon (not sent to pathology)  DISPOSITION OF SPECIMEN:  N/A  COUNTS:  YES  TOURNIQUET:  * No tourniquets in log *  DICTATION: .Other Dictation: Dictation Number dictated  PLAN OF CARE: Admit to inpatient   PATIENT DISPOSITION:  ICU - intubated and critically ill.   Delay start of Pharmacological VTE agent (>24hrs) due to surgical blood loss or risk of bleeding: no

## 2013-07-08 NOTE — Procedures (Signed)
Central Venous Catheter Insertion Procedure Note [name] [id number] August 28, 1981  Procedure: Insertion of Central Venous Catheter Indications: Assessment of intravascular volume  Procedure Details Consent: Risks of procedure as well as the alternatives and risks of each were explained to the (patient/caregiver).  Consent for procedure obtained. Time Out: Verified patient identification, verified procedure, site/side was marked, verified correct patient position, special equipment/implants available, medications/allergies/relevent history reviewed, required imaging and test results available.  Performed  Maximum sterile technique was used including antiseptics, cap, gloves, gown, hand hygiene, mask and sheet. Skin prep: Chlorhexidine; local anesthetic administered A antimicrobial bonded/coated triple lumen catheter was placed in the left internal jugular vein using the Seldinger technique. Ultrasound guidance used.yes Catheter placed to 20 cm. Blood aspirated via all 3 ports and then flushed x 3. Line sutured x 2 and dressing applied.  Evaluation Blood flow good Complications: No apparent complications Patient did tolerate procedure well. Chest X-ray ordered to verify placement.  CXR: pending.  Brett Canales Minor ACNP [facility] PCCM Pager [phone] till 3 pm If no answer page [phone] 07/08/2013, 11:52 AM  Levy Pupa, MD, PhD 07/08/2013, 12:44 PM [facility] [phone] or if no answer [phone]

## 2013-07-08 NOTE — Consult Note (Signed)
PULMONARY  / CRITICAL CARE MEDICINE  Name: [name] MRN: [id number] DOB: March 30, 1981    ADMISSION DATE:  07/05/2013 CONSULTATION DATE: 10-27  REFERRING MD :  CCS PRIMARY SERVICE: CCS  CHIEF COMPLAINT: Abd pain  BRIEF PATIENT DESCRIPTION:  32 yo paraplegic (spina bifida) who underwent lap chole 10-25 and had increased abdominal distention 10-27, increased wob, no IV access and PCCM asked to place cordis for IV contrast for abd CT. He was found to be in resp. distress, LIJ cvl and codrdis  Were placed under USG without acute problem. Plan is to intubate and protect airway for CT of Abd.   SIGNIFICANT EVENTS / STUDIES:  10-27 acute resp distress  LINES / TUBES: 10-27 lt ij cvl>> 10-27 Lt ij cordis>>  CULTURES: 10-24 UC>>proteus   ANTIBIOTICS: 10-25 zosyn >>  HISTORY OF PRESENT ILLNESS:   32 yo paraplegic (spina bifida) who underwent lap chole 10-25 and had increased abdominal distention 10-27, increased wob, no IV access and PCCM asked to place cordis for IV contrast for chest and abd CT. He was found to be in resp. distress, LIJ cvl and cordis were placed under USG without acute problem. Plan is to intubate and protect airway for CT of chest and Abd.   PAST MEDICAL HISTORY :  Past Medical History  Diagnosis Date  . Spina bifida   . Hypertension    Past Surgical History  Procedure Laterality Date  . Back surgery      x2  . Hernia repair      umbilical hernia  . Ventriculoperitoneal shunt      revised 36yrs ago  . Hip surgery    . Testicle surgery      hx undescended testicles   Prior to Admission medications   Medication Sig Start Date End Date Taking? Authorizing Provider  acetaminophen (TYLENOL) 650 MG CR tablet Take 1,300 mg by mouth every 8 (eight) hours as needed for pain.   Yes Historical Provider, MD  omeprazole (PRILOSEC) 20 MG capsule Take 20 mg by mouth daily.   Yes Historical Provider, MD  valsartan-hydrochlorothiazide (DIOVAN-HCT) 80-12.5 MG per  tablet Take 1 tablet by mouth daily.   Yes Historical Provider, MD  HYDROcodone-acetaminophen (NORCO/VICODIN) 5-325 MG per tablet Take 1 tablet by mouth every 4 (four) hours as needed for pain. 07/05/13   Roney Marion, MD  nitrofurantoin, macrocrystal-monohydrate, (MACROBID) 100 MG capsule Take 1 capsule (100 mg total) by mouth 2 (two) times daily. 07/05/13   Roney Marion, MD   Allergies  Allergen Reactions  . Ciprofloxacin     IV only caused burning in arm   . Latex Hives  . Vancomycin Itching and Swelling    FAMILY HISTORY:  History reviewed. No pertinent family history.  SOCIAL HISTORY:  reports that he has never smoked. He has never used smokeless tobacco. He reports that he does not drink alcohol or use illicit drugs.  REVIEW OF SYSTEMS:   10 point review of system taken, please see HPI for positives and negatives.   SUBJECTIVE:   VITAL SIGNS: Temp:  [97.3 F (36.3 C)-99.4 F (37.4 C)] 98 F (36.7 C) (10/27 0900) Pulse Rate:  [39-144] 39 (10/27 1000) Resp:  [20-32] 23 (10/27 1000) BP: (90-135)/(55-72) 93/62 mmHg (10/27 1000) SpO2:  [62 %-100 %] 62 % (10/27 1000) Weight:  [198 lb 10.2 oz (90.1 kg)] 198 lb 10.2 oz (90.1 kg) (10/27 0900) HEMODYNAMICS:   VENTILATOR SETTINGS:   INTAKE / OUTPUT: Intake/Output  10/26 0701 - 10/27 0700 10/27 0701 - 10/28 0700   P.O. 1680 650   I.V. (mL/kg) 4000 (61.2) 750 (8.3)   Other 20 30   IV Piggyback 150    Total Intake(mL/kg) 5850 (89.5) 1430 (15.9)   Urine (mL/kg/hr) 2150 (1.4) 50 (0.1)   Drains 65 (0)    Total Output 2215 50   Net +3635 +1380          PHYSICAL EXAMINATION: General:  MO jaundice male with increased wob Neuro:  Intact xcept paraplegia HEENT:  No jvd/LAN Cardiovascular:  HSR RRR Lungs:  Increased wob, poor air movement, grunting respirations Abdomen:  Distended, jp with bloody drainage. Musculoskeletal:  Lower ext contractures Skin:  Cool and juandice  LABS:  CBC Recent Labs     07/06/13  0417   07/07/13  0515  07/08/13  0430  WBC  7.1  17.5*  12.4*  HGB  14.1  12.1*  10.1*  HCT  42.1  36.5*  31.2*  PLT  203  286  285   Coag's No results found for this basename: APTT, INR,  in the last 72 hours BMET Recent Labs     07/06/13  0417  07/07/13  0515  07/08/13  0430  NA  135  125*  125*  K  4.2  4.3  4.8  CL  102  90*  94*  CO2  23  25  21   BUN  7  7  10   CREATININE  0.45*  0.62  0.61  GLUCOSE  124*  115*  130*   Electrolytes Recent Labs     07/06/13  0417  07/07/13  0515  07/08/13  0430  CALCIUM  8.9  8.8  8.2*   Sepsis Markers No results found for this basename: LACTICACIDVEN, PROCALCITON, O2SATVEN,  in the last 72 hours ABG No results found for this basename: PHART, PCO2ART, PO2ART,  in the last 72 hours Liver Enzymes Recent Labs     07/06/13  0417  07/07/13  0515  07/08/13  0430  AST  451*  127*  44*  ALT  576*  378*  207*  ALKPHOS  145*  130*  104  BILITOT  3.0*  5.4*  2.0*  ALBUMIN  3.3*  3.0*  2.6*   Cardiac Enzymes No results found for this basename: TROPONINI, PROBNP,  in the last 72 hours Glucose No results found for this basename: GLUCAP,  in the last 72 hours  Imaging Dg Chest Port 1 View  07/08/2013   CLINICAL DATA:  Left-sided chest pain  EXAM: PORTABLE CHEST - 1 VIEW  COMPARISON:  07/05/2013  FINDINGS: Cardiac shadow is stable. Postoperative changes are again seen. The lungs are clear  IMPRESSION: No acute abnormality noted.   Electronically Signed   By: Alcide Clever M.D.   On: 07/08/2013 07:14     ASSESSMENT / PLAN:  PULMONARY A:Resp failure in setting of abd distension, morbid obesity, suspected OSA. P:   Plan for intubation for airway protection 10-27  CT-PA ordered to eval for PE and to assess lung parenchyma.   CARDIOVASCULAR A: relative hypotension, etiology unclear. Consider evolving sepsis, ascending cholangitis given sgy and transaminitis Hx HTN P:  Hold home HTN regimen Infection w/u as below  RENAL Lab Results   Component Value Date   CREATININE 0.61 07/08/2013   CREATININE 0.62 07/07/2013   CREATININE 0.45* 07/06/2013   A:  No acute issue P:    GASTROINTESTINAL A:  Post lap  chole with distention P:   Per CCS > may benefit from NGT placement for abd decompression CT abd/pelvis  HEMATOLOGIC A:  No acute issue P:   INFECTIOUS A: cholecystitis, consider ascending cholangitis or other operative sequelae. GB was not totally removed at sgy.  Sepsis, at risk for progression Proteus UTI P:   On zosyn CT abd/pelvis ordered for 10/27  ENDOCRINE A:  No acute issue   P:    NEUROLOGIC A:  Paraplegia secondary to spina bifida otherwise intact P:   Pulmonary hygeine  TODAY'S SUMMARY:  32 yo paraplegic (spina bifida) who underwent lap chole 10-25 and had increased abdominal distention 10-27, increased wob, no IV access and PCCM asked to place cordis for IV contrast for chest/abd CT. He was found to be in resp. distress, LIJ cvl and cordis were placed under USG without acute problem. Plan is to intubate for evolving resp distress and for airway protection.     Brett Canales Minor ACNP [facility] PCCM Pager [phone] till 3 pm If no answer page [phone] 07/08/2013, 12:01 PM  CC time 90 minutes  Levy Pupa, MD, PhD 07/08/2013, 12:32 PM [facility] [phone] or if no answer [phone]

## 2013-07-09 ENCOUNTER — Encounter (HOSPITAL_COMMUNITY): Payer: Self-pay | Admitting: General Surgery

## 2013-07-09 ENCOUNTER — Inpatient Hospital Stay (HOSPITAL_COMMUNITY): Payer: PRIVATE HEALTH INSURANCE

## 2013-07-09 DIAGNOSIS — A419 Sepsis, unspecified organism: Secondary | ICD-10-CM | POA: Diagnosis not present

## 2013-07-09 LAB — BLOOD GAS, ARTERIAL
Acid-base deficit: 6.7 mmol/L — ABNORMAL HIGH (ref 0.0–2.0)
Acid-base deficit: 8.5 mmol/L — ABNORMAL HIGH (ref 0.0–2.0)
Drawn by: 317871
FIO2: 0.5 %
FIO2: 0.7 %
MECHVT: 0.34 mL
MECHVT: 0.39 mL
O2 Saturation: 96.8 %
O2 Saturation: 97.9 %
PEEP: 5 cmH2O
Patient temperature: 37
Patient temperature: 37
RATE: 32 resp/min
TCO2: 14.4 mmol/L (ref 0–100)
pCO2 arterial: 33.4 mmHg — ABNORMAL LOW (ref 35.0–45.0)
pH, Arterial: 7.31 — ABNORMAL LOW (ref 7.350–7.450)
pO2, Arterial: 86 mmHg (ref 80.0–100.0)

## 2013-07-09 LAB — POCT I-STAT 7, (LYTES, BLD GAS, ICA,H+H)
Acid-base deficit: 14 mmol/L — ABNORMAL HIGH (ref 0.0–2.0)
Acid-base deficit: 14 mmol/L — ABNORMAL HIGH (ref 0.0–2.0)
Bicarbonate: 14.4 mEq/L — ABNORMAL LOW (ref 20.0–24.0)
Calcium, Ion: 1.05 mmol/L — ABNORMAL LOW (ref 1.12–1.23)
HCT: 24 % — ABNORMAL LOW (ref 39.0–52.0)
O2 Saturation: 97 %
O2 Saturation: 93 %
Potassium: 4.6 mEq/L (ref 3.5–5.1)
Sodium: 127 mEq/L — ABNORMAL LOW (ref 135–145)
Sodium: 131 mEq/L — ABNORMAL LOW (ref 135–145)
TCO2: 17 mmol/L (ref 0–100)
pH, Arterial: 7.072 — CL (ref 7.350–7.450)
pO2, Arterial: 112 mmHg — ABNORMAL HIGH (ref 80.0–100.0)
pO2, Arterial: 98 mmHg (ref 80.0–100.0)

## 2013-07-09 LAB — POCT I-STAT 4, (NA,K, GLUC, HGB,HCT)
Glucose, Bld: 120 mg/dL — ABNORMAL HIGH (ref 70–99)
Hemoglobin: 8.2 g/dL — ABNORMAL LOW (ref 13.0–17.0)
Potassium: 4.1 mEq/L (ref 3.5–5.1)

## 2013-07-09 LAB — CBC
HCT: 44.7 % (ref 39.0–52.0)
Hemoglobin: 15.8 g/dL (ref 13.0–17.0)
MCHC: 35.3 g/dL (ref 30.0–36.0)
MCV: 79.1 fL (ref 78.0–100.0)
RBC: 5.65 MIL/uL (ref 4.22–5.81)
RDW: 13.7 % (ref 11.5–15.5)
WBC: 18.3 10*3/uL — ABNORMAL HIGH (ref 4.0–10.5)

## 2013-07-09 LAB — PREPARE FRESH FROZEN PLASMA
Unit division: 0
Unit division: 0

## 2013-07-09 LAB — COMPREHENSIVE METABOLIC PANEL
ALT: 147 U/L — ABNORMAL HIGH (ref 0–53)
Albumin: 1.7 g/dL — ABNORMAL LOW (ref 3.5–5.2)
Alkaline Phosphatase: 51 U/L (ref 39–117)
BUN: 10 mg/dL (ref 6–23)
CO2: 16 mEq/L — ABNORMAL LOW (ref 19–32)
Creatinine, Ser: 0.51 mg/dL (ref 0.50–1.35)
GFR calc Af Amer: >90 mL/min (ref >90)
GFR calc non Af Amer: >90 mL/min (ref >90)
Glucose, Bld: 122 mg/dL — ABNORMAL HIGH (ref 70–99)
Potassium: 4.3 mEq/L (ref 3.5–5.1)
Sodium: 121 mEq/L — ABNORMAL LOW (ref 135–145)
Total Protein: 4.6 g/dL — ABNORMAL LOW (ref 6.0–8.3)

## 2013-07-09 LAB — CORTISOL: Cortisol, Plasma: 49 ug/dL

## 2013-07-09 MED ORDER — PHENYLEPHRINE HCL 10 MG/ML IJ SOLN: 30.0000 ug/min | INTRAMUSCULAR | Status: DC

## 2013-07-09 MED ORDER — PHENYLEPHRINE HCL 10 MG/ML IJ SOLN
30.0000 ug/min | INTRAVENOUS | Status: DC
  Administered 2013-07-09: 40 ug/min via INTRAVENOUS

## 2013-07-09 MED ORDER — SODIUM CHLORIDE 0.9 % IV SOLN
1.0000 mg/h | INTRAVENOUS | Status: DC
  Administered 2013-07-09: 2 mg/h via INTRAVENOUS
  Filled 2013-07-09: qty 10

## 2013-07-09 MED ORDER — SODIUM CHLORIDE 0.9 % IV SOLN
25.0000 ug/h | INTRAVENOUS | Status: DC
  Administered 2013-07-09: 50 ug/h via INTRAVENOUS
  Filled 2013-07-09: qty 50

## 2013-07-09 MED ORDER — VASOPRESSIN 20 UNIT/ML IJ SOLN
0.0300 [IU]/min | INTRAVENOUS | Status: DC
  Administered 2013-07-09: 0.03 [IU]/min via INTRAVENOUS
  Filled 2013-07-09: qty 2.5

## 2013-07-09 MED ORDER — FENTANYL BOLUS VIA INFUSION
25.0000 ug | Freq: Four times a day (QID) | INTRAVENOUS | Status: DC | PRN
  Filled 2013-07-09: qty 100

## 2013-07-09 MED ORDER — NOREPINEPHRINE BITARTRATE 1 MG/ML IJ SOLN
2.0000 ug/min | INTRAVENOUS | Status: DC
  Administered 2013-07-09: 50 ug/min via INTRAVENOUS
  Administered 2013-07-09: 30 ug/min via INTRAVENOUS
  Filled 2013-07-09 (×2): qty 16

## 2013-07-09 MED ORDER — MIDAZOLAM BOLUS VIA INFUSION
1.0000 mg | INTRAVENOUS | Status: DC | PRN
  Filled 2013-07-09: qty 2

## 2013-07-09 MED ORDER — SODIUM CHLORIDE 0.9 % IV SOLN
100.0000 mg | Freq: Every day | INTRAVENOUS | Status: DC
  Administered 2013-07-09: 100 mg via INTRAVENOUS
  Filled 2013-07-09: qty 100

## 2013-07-09 NOTE — Progress Notes (Addendum)
1 Day Post-Op  Subjective: To OR last night for partial colectomy and control of contamination  Objective: Vital signs in last 24 hours: Temp:  [96 F (35.6 C)-98 F (36.7 C)] 96 F (35.6 C) (10/27 2212) Pulse Rate:  [39-133] 131 (10/28 0600) Resp:  [14-42] 29 (10/28 0600) BP: (68-134)/(33-101) 112/65 mmHg (10/28 0317) SpO2:  [62 %-100 %] 99 % (10/28 0600) Arterial Line BP: (99-120)/(51-70) 116/59 mmHg (10/28 0600) FiO2 (%):  [40 %-100 %] 40 % (10/28 0729) Weight:  [198 lb 10.2 oz (90.1 kg)-230 lb 2.6 oz (104.4 kg)] 230 lb 2.6 oz (104.4 kg) (10/27 2212) Last BM Date: 07/05/13  Intake/Output from previous day: 10/27 0701 - 10/28 0700 In: 19991.3 [P.O.:650; I.V.:17385.3; Blood:1051; IV Piggyback:875] Out: 6640 [Urine:2150; Emesis/NG output:1200; Drains:1040; Blood:750] Intake/Output this shift:    General appearance: cooperative and no distress Resp: coarse bilat Cardio: sinus tachycardia, HR 132, regular, levophed, 130 mcg neo GI: abdominal wound vac in place, very distended, JP with ss output, wound vac with ss output Extremities: SCD's bilat Pulses: 1+ pulses bilat Neurologic: Mental status: responds to verbal commands, moving bilat. extremities  Lab Results:   Recent Labs  07/08/13 0430  07/08/13 2018 07/09/13 0556  WBC 12.4*  --   --  18.3*  HGB 10.1*  < > 10.2* 15.8  HCT 31.2*  < > 30.0* 44.7  PLT 285  --   --  313  < > = values in this interval not displayed. BMET  Recent Labs  07/08/13 0430 07/08/13 1730  07/08/13 2018 07/09/13 0556  NA 125* 129*  < > 131* 121*  K 4.8 4.1  < > 4.6 4.3  CL 94*  --   --   --  92*  CO2 21  --   --   --  16*  GLUCOSE 130* 120*  --   --  122*  BUN 10  --   --   --  10  CREATININE 0.61  --   --   --  0.51  CALCIUM 8.2*  --   --   --  7.2*  < > = values in this interval not displayed. PT/INR  Recent Labs  07/08/13 1720  LABPROT 18.2*  INR 1.55*   ABG  Recent Labs  07/09/13 0036 07/09/13 0317  PHART  7.310* 7.326*  HCO3 16.3* 18.0*    Studies/Results: Ct Angio Chest Pe W/cm &/or Wo Cm  07/08/2013   CLINICAL DATA:  Tachycardia. Status post cholecystectomy.  EXAM: CT ANGIOGRAPHY CHEST WITH CONTRAST  TECHNIQUE: Multidetector CT imaging of the chest was performed using the standard protocol during bolus administration of intravenous contrast. Multiplanar CT image reconstructions including MIPs were obtained to evaluate the vascular anatomy.  CONTRAST:  OMNIPAQUE IOHEXOL 350 MG/ML SOLN  COMPARISON:  None.  FINDINGS: There are no filling defects in the pulmonary arterial tree to suggest acute pulmonary thromboembolism.  No pericardial effusion or abnormal mediastinal adenopathy.  Small right pleural effusion.  Bibasilar atelectasis 1st consolidation in the dependent lower lobes bilaterally. Ground-glass in the right upper and left upper lobes likely represents volume loss.  Spinal stabilization hardware is in place for thoracolumbar fusion.  There is subcutaneous gas over the right chest wall. A large amount of free intraperitoneal gas and gas in the gallbladder fossa is present. Endotracheal tube is in place.  Review of the MIP images confirms the above findings.  IMPRESSION: No evidence of acute pulmonary thromboembolism.  Right pleural effusion.  Bibasilar  atelectasis 1st consolidation.  There is intraperitoneal gas as well as subcutaneous gas over the right chest wall compatible with ruptured viscus in the abdomen.   Electronically Signed   By: Maryclare Bean M.D.   On: 07/08/2013 16:05   Ct Abdomen Pelvis W Contrast  07/08/2013   CLINICAL DATA:  Tachycardia after cholecystectomy.  EXAM: CT ABDOMEN AND PELVIS WITH CONTRAST  TECHNIQUE: Multidetector CT imaging of the abdomen and pelvis was performed using the standard protocol following bolus administration of intravenous contrast.  CONTRAST:  OMNIPAQUE IOHEXOL 350 MG/ML SOLN  COMPARISON:  07/05/2013  FINDINGS: There is a large defect within the  hepatic flexure of the colon. Gas is seen freely communicating from the lumen of the colon and into the peritoneal space as well as the gallbladder fossa. There is a large amount of hyperdense fluid surrounding the right lobe of the liver which scallops the liver border. Wall thickening of the hepatic flexure of the colon is associated. This is consistent with a: Perforation.  There is a large amount of gas within the subcutaneous fat over the right chest and right side of the abdomen likely related to the colon perforation.  A surgical drain enters the peritoneum in the right side abdomen and is positioned within the surgical bed.  There is also a VP shunt catheter entering the peritoneal space in the right lower quadrant positioned within right lower quadrant fluid.  NG tube is coiled in the stomach.  Kidneys, spleen, pancreas, and adrenal glands are stable.  There is severe atrophy of the musculature and deformity of the spine. Stabilization hardware is in place. The left vertical stabilizing bars fractured in the lower thoracic spine region.  Foley catheter decompresses the bladder.  Small amount of free fluid is present in the left lower quadrant.  Scattered dilated small bowel loops compatible with associated ileus.  IMPRESSION: Colon perforation at the hepatic flexure associated with a large amount of intraperitoneal gas and fluid. Fluid is hyperdense likely due tube bowel contents or oral contrast.  VP shunt catheter enters the right lower quadrant as described.  A surgical drain is in place within the operative bed.  There is a large amount of subcutaneous gas over the right abdomen and chest wall. Necrotizing fasciitis is through the abdominal wall cannot be excluded.   Electronically Signed   By: Maryclare Bean M.D.   On: 07/08/2013 16:20   Dg Chest Port 1 View  07/08/2013   CLINICAL DATA:  Post exploratory laparotomy  EXAM: PORTABLE CHEST - 1 VIEW  COMPARISON:  Portable exam 2143 hr compared to 07/08/2013  at 1353 hr  FINDINGS: Nasogastric and endotracheal tubes again identified.  Tip of the endotracheal tube is below thoracic inlet high in the chest, likely above the carina, but the carina is inadequately visualized due to technique and spinal hardware.  Significant thoracic deformities again noted.  Low lung volumes with poor aeration of both lungs. No gross pneumothorax.  VP shunt tubing right chest and question left jugular line noted.  IMPRESSION: Very low lung volumes with poor aeration.  Tip of endotracheal tube projects over the high thorax just below thoracic inlet, suspect in satisfactory position above the carina though the carina is inadequately localized.   Electronically Signed   By: Ulyses Southward M.D.   On: 07/08/2013 21:52   Dg Chest Port 1 View  07/08/2013   CLINICAL DATA:  Central line, intubation  EXAM: PORTABLE CHEST - 1 VIEW  COMPARISON:  07/08/2013 at 1202 hours  FINDINGS: Lungs are poorly aerated. No pneumothorax is seen.  Endotracheal tube terminates just below the thoracic inlet.  Enteric tube is poorly visualized but appears looped within the stomach.  Right VP shunt catheter is unchanged. Stable left IJ venous catheter.  Cardiothymic silhouette is enlarged.  Thoracolumbar spine fixation hardware.  IMPRESSION: Endotracheal tube terminates just below the thoracic inlet.  Enteric tube is poorly visualized but appears looped within the stomach.  Additional support apparatus is unchanged.   Electronically Signed   By: Charline Bills M.D.   On: 07/08/2013 14:27   Dg Chest Port 1 View  07/08/2013   CLINICAL DATA:  Left internal jugular vein catheter placement.  EXAM: PORTABLE CHEST - 1 VIEW  COMPARISON:  650 hr  FINDINGS: Catheter from VP shunt and a catheter fragment in the right neck are stable. New catheter has been placed in the left neck with its tip projecting over the confluence of the left internal jugular vein and left subclavian vein. Spinal stabilization hardware is stable. The  left stabilizing bar remains fractured. Lungs are severely under aerated with diffuse opacity loculated volume loss. Cardiothymic silhouette is prominent. No pneumothorax.  IMPRESSION: New left internal jugular venous catheter with its tip projecting over the region of the left internal jugular vein and left subclavian vein. No pneumothorax.   Electronically Signed   By: Maryclare Bean M.D.   On: 07/08/2013 12:27   Dg Chest Port 1 View  07/08/2013   CLINICAL DATA:  Left-sided chest pain  EXAM: PORTABLE CHEST - 1 VIEW  COMPARISON:  07/05/2013  FINDINGS: Cardiac shadow is stable. Postoperative changes are again seen. The lungs are clear  IMPRESSION: No acute abnormality noted.   Electronically Signed   By: Alcide Clever M.D.   On: 07/08/2013 07:14    Anti-infectives: Anti-infectives   Start     Dose/Rate Route Frequency Ordered Stop   07/08/13 2019  polymyxin B 500,000 Units, bacitracin 50,000 Units in sodium chloride irrigation 0.9 % 500 mL irrigation  Status:  Discontinued       As needed 07/08/13 2019 07/08/13 2108   07/05/13 1800  piperacillin-tazobactam (ZOSYN) IVPB 3.375 g     3.375 g 12.5 mL/hr over 240 Minutes Intravenous Every 8 hours 07/05/13 1637        Assessment/Plan: s/p Procedure(s): EXPLORATORY LAPAROSCOPY, DIAGNOSTIC LAPAROSCOPY, PARTIAL COLECTOMY, ABDOMINAL WASHOUT, ABDOMINAL WOUND VAC (N/A) He appears septic from the intraabdominal contamination, though contamination appears controlled.  No apparent neurologic problems.  NSG will comment on management of VP shunt.  Appreciate CCM for management of resussitation, pressors, and vent.  He will need return to OR tomorrow for wound vac change and hopefully able to perform proximal bowel diversion since intestines are not in continuity. His anatomy is such that this is very difficult and I plan on discussing his case with surgery at [facility] to possibly arrange transfer for definitive surgical care.  Broad spectrum  abx for colon flora.  I discussed his case with his mother who was at the bedside today.  I discussed with her the planned procedure for tomorrow.  LOS: 4 days    Lodema Pilot DAVID 07/09/2013 I have spoken with Dr. Hoover Brunette at [facility]. About his case and she has agreed to accept him in transfer for definitve management for this difficult anatomy.  I also discussed with the mother the plan for transfer and she agreed for transfer.

## 2013-07-09 NOTE — Progress Notes (Signed)
PULMONARY  / CRITICAL CARE MEDICINE  Name: [name] MRN: [id number] DOB: 12/31/1980    ADMISSION DATE:  07/05/2013 CONSULTATION DATE: 10-27  REFERRING MD :  [facility]S PRIMARY SERVICE: CCS  CHIEF COMPLAINT: Abd pain  BRIEF PATIENT DESCRIPTION:  32 yo paraplegic (spina bifida) who underwent lap chole 10/25, evolved resp failure and over decline on 10/27. Required MV, transitioned to ARDS protocol. Progressive septic shock. CT abd showed bowel perf 10/27. Back to OR for partial colectomy but colostomy could not be raised due to anatomy. Possible transfer planned for definitive surgical Rx  SIGNIFICANT EVENTS / STUDIES:  10-27 acute resp distress 10-27 intubated/ards protocol 10-27 return to OR noted to have bowel perf. 10-27 septic shock   LINES / TUBES: 10-27 lt ij cvl>> 10-27 Lt ij cordis>> 10-27 OTT>> 10-27 rt rad aline>>  CULTURES: 10-24 UC>>proteus  10-28 sputum>> 10-28 bc x 2>>  ANTIBIOTICS: zosyn 10/25 >> micafungin 10/28 >>   SUBJECTIVE/ INTERVAL :  Noted to have bowel perf on CT abd 10/27, to OR  Evolved septic shock  VITAL SIGNS: Temp:  [96 F (35.6 C)-98 F (36.7 C)] 96 F (35.6 C) (10/27 2212) Pulse Rate:  [39-133] 131 (10/28 0600) Resp:  [14-42] 29 (10/28 0600) BP: (68-134)/(33-101) 112/65 mmHg (10/28 0317) SpO2:  [62 %-100 %] 99 % (10/28 0600) Arterial Line BP: (99-120)/(51-70) 116/59 mmHg (10/28 0600) FiO2 (%):  [40 %-100 %] 40 % (10/28 0729) Weight:  [198 lb 10.2 oz (90.1 kg)-230 lb 2.6 oz (104.4 kg)] 230 lb 2.6 oz (104.4 kg) (10/27 2212) HEMODYNAMICS:   VENTILATOR SETTINGS: Vent Mode:  [-] PRVC FiO2 (%):  [40 %-100 %] 40 % Set Rate:  [14 bmp-32 bmp] 32 bmp Vt Set:  [340 mL-500 mL] 340 mL PEEP:  [5 cmH20] 5 cmH20 Plateau Pressure:  [28 cmH20-52 cmH20] 28 cmH20 INTAKE / OUTPUT: Intake/Output     10/27 0701 - 10/28 0700 10/28 0701 - 10/29 0700   P.O. 650    I.V. (mL/kg) 17385.3 (166.5)    Blood 1051    Other 30    IV Piggyback 875     Total Intake(mL/kg) 19991.3 (191.5)    Urine (mL/kg/hr) 2150 (0.9)    Emesis/NG output 1200 (0.5)    Drains 1040 (0.4)    Other 1500 (0.6)    Blood 750 (0.3)    Total Output 6640     Net +13351.3            PHYSICAL EXAMINATION: General:  MO jaundice male , sedated on vent Neuro:  Intact xcept paraplegia, follows commands HEENT:  No jvd/LAN, lt i j cordis/cvl, ott->vent Cardiovascular:  HSR  tachycardia 140's, neo and levo drips noted Lungs:  Vent, low stretch, no air trapping, plt pressures 24 Abdomen:  Distended, jp with bloody drainage.wound vac in place Musculoskeletal:  Lower ext contractures Skin:  Cool and juandice  LABS:  CBC Recent Labs     07/07/13  0515  07/08/13  0430   07/08/13  1836  07/08/13  2018  07/09/13  0556  WBC  17.5*  12.4*   --    --    --   18.3*  HGB  12.1*  10.1*   < >  8.2*  10.2*  15.8  HCT  36.5*  31.2*   < >  24.0*  30.0*  44.7  PLT  286  285   --    --    --   313   < > =  values in this interval not displayed.   Coag's Recent Labs     07/08/13  1720  INR  1.55*   BMET Recent Labs     07/07/13  0515  07/08/13  0430  07/08/13  1730  07/08/13  1836  07/08/13  2018  07/09/13  0556  NA  125*  125*  129*  127*  131*  121*  K  4.3  4.8  4.1  4.3  4.6  4.3  CL  90*  94*   --    --    --   92*  CO2  25  21   --    --    --   16*  BUN  7  10   --    --    --   10  CREATININE  0.62  0.61   --    --    --   0.51  GLUCOSE  115*  130*  120*   --    --   122*   Electrolytes Recent Labs     07/07/13  0515  07/08/13  0430  07/09/13  0556  CALCIUM  8.8  8.2*  7.2*   Sepsis Markers No results found for this basename: LACTICACIDVEN, PROCALCITON, O2SATVEN,  in the last 72 hours ABG Recent Labs     07/08/13  2324  07/09/13  0036  07/09/13  0317  PHART  7.304*  7.310*  7.326*  PCO2ART  34.9*  33.4*  35.5  PO2ART  79.5*  107.0*  86.0   Liver Enzymes Recent Labs     07/07/13  0515  07/08/13  0430  07/09/13  0556  AST  127*   44*  125*  ALT  378*  207*  147*  ALKPHOS  130*  104  51  BILITOT  5.4*  2.0*  1.7*  ALBUMIN  3.0*  2.6*  1.7*   Cardiac Enzymes No results found for this basename: TROPONINI, PROBNP,  in the last 72 hours Glucose No results found for this basename: GLUCAP,  in the last 72 hours  Imaging Ct Angio Chest Pe W/cm &/or Wo Cm  07/08/2013   CLINICAL DATA:  Tachycardia. Status post cholecystectomy.  EXAM: CT ANGIOGRAPHY CHEST WITH CONTRAST  TECHNIQUE: Multidetector CT imaging of the chest was performed using the standard protocol during bolus administration of intravenous contrast. Multiplanar CT image reconstructions including MIPs were obtained to evaluate the vascular anatomy.  CONTRAST:  OMNIPAQUE IOHEXOL 350 MG/ML SOLN  COMPARISON:  None.  FINDINGS: There are no filling defects in the pulmonary arterial tree to suggest acute pulmonary thromboembolism.  No pericardial effusion or abnormal mediastinal adenopathy.  Small right pleural effusion.  Bibasilar atelectasis 1st consolidation in the dependent lower lobes bilaterally. Ground-glass in the right upper and left upper lobes likely represents volume loss.  Spinal stabilization hardware is in place for thoracolumbar fusion.  There is subcutaneous gas over the right chest wall. A large amount of free intraperitoneal gas and gas in the gallbladder fossa is present. Endotracheal tube is in place.  Review of the MIP images confirms the above findings.  IMPRESSION: No evidence of acute pulmonary thromboembolism.  Right pleural effusion.  Bibasilar atelectasis 1st consolidation.  There is intraperitoneal gas as well as subcutaneous gas over the right chest wall compatible with ruptured viscus in the abdomen.   Electronically Signed   By: Maryclare Bean M.D.   On: 07/08/2013 16:05   Ct Abdomen  Pelvis W Contrast  07/08/2013   CLINICAL DATA:  Tachycardia after cholecystectomy.  EXAM: CT ABDOMEN AND PELVIS WITH CONTRAST  TECHNIQUE: Multidetector CT imaging of  the abdomen and pelvis was performed using the standard protocol following bolus administration of intravenous contrast.  CONTRAST:  OMNIPAQUE IOHEXOL 350 MG/ML SOLN  COMPARISON:  07/05/2013  FINDINGS: There is a large defect within the hepatic flexure of the colon. Gas is seen freely communicating from the lumen of the colon and into the peritoneal space as well as the gallbladder fossa. There is a large amount of hyperdense fluid surrounding the right lobe of the liver which scallops the liver border. Wall thickening of the hepatic flexure of the colon is associated. This is consistent with a: Perforation.  There is a large amount of gas within the subcutaneous fat over the right chest and right side of the abdomen likely related to the colon perforation.  A surgical drain enters the peritoneum in the right side abdomen and is positioned within the surgical bed.  There is also a VP shunt catheter entering the peritoneal space in the right lower quadrant positioned within right lower quadrant fluid.  NG tube is coiled in the stomach.  Kidneys, spleen, pancreas, and adrenal glands are stable.  There is severe atrophy of the musculature and deformity of the spine. Stabilization hardware is in place. The left vertical stabilizing bars fractured in the lower thoracic spine region.  Foley catheter decompresses the bladder.  Small amount of free fluid is present in the left lower quadrant.  Scattered dilated small bowel loops compatible with associated ileus.  IMPRESSION: Colon perforation at the hepatic flexure associated with a large amount of intraperitoneal gas and fluid. Fluid is hyperdense likely due tube bowel contents or oral contrast.  VP shunt catheter enters the right lower quadrant as described.  A surgical drain is in place within the operative bed.  There is a large amount of subcutaneous gas over the right abdomen and chest wall. Necrotizing fasciitis is through the abdominal wall cannot be excluded.    Electronically Signed   By: Maryclare Bean M.D.   On: 07/08/2013 16:20   Dg Chest Port 1 View  07/08/2013   CLINICAL DATA:  Post exploratory laparotomy  EXAM: PORTABLE CHEST - 1 VIEW  COMPARISON:  Portable exam 2143 hr compared to 07/08/2013 at 1353 hr  FINDINGS: Nasogastric and endotracheal tubes again identified.  Tip of the endotracheal tube is below thoracic inlet high in the chest, likely above the carina, but the carina is inadequately visualized due to technique and spinal hardware.  Significant thoracic deformities again noted.  Low lung volumes with poor aeration of both lungs. No gross pneumothorax.  VP shunt tubing right chest and question left jugular line noted.  IMPRESSION: Very low lung volumes with poor aeration.  Tip of endotracheal tube projects over the high thorax just below thoracic inlet, suspect in satisfactory position above the carina though the carina is inadequately localized.   Electronically Signed   By: Ulyses Southward M.D.   On: 07/08/2013 21:52   Dg Chest Port 1 View  07/08/2013   CLINICAL DATA:  Central line, intubation  EXAM: PORTABLE CHEST - 1 VIEW  COMPARISON:  07/08/2013 at 1202 hours  FINDINGS: Lungs are poorly aerated. No pneumothorax is seen.  Endotracheal tube terminates just below the thoracic inlet.  Enteric tube is poorly visualized but appears looped within the stomach.  Right VP shunt catheter is unchanged. Stable left IJ  venous catheter.  Cardiothymic silhouette is enlarged.  Thoracolumbar spine fixation hardware.  IMPRESSION: Endotracheal tube terminates just below the thoracic inlet.  Enteric tube is poorly visualized but appears looped within the stomach.  Additional support apparatus is unchanged.   Electronically Signed   By: Charline Bills M.D.   On: 07/08/2013 14:27   Dg Chest Port 1 View  07/08/2013   CLINICAL DATA:  Left internal jugular vein catheter placement.  EXAM: PORTABLE CHEST - 1 VIEW  COMPARISON:  650 hr  FINDINGS: Catheter from VP shunt and a  catheter fragment in the right neck are stable. New catheter has been placed in the left neck with its tip projecting over the confluence of the left internal jugular vein and left subclavian vein. Spinal stabilization hardware is stable. The left stabilizing bar remains fractured. Lungs are severely under aerated with diffuse opacity loculated volume loss. Cardiothymic silhouette is prominent. No pneumothorax.  IMPRESSION: New left internal jugular venous catheter with its tip projecting over the region of the left internal jugular vein and left subclavian vein. No pneumothorax.   Electronically Signed   By: Maryclare Bean M.D.   On: 07/08/2013 12:27   Dg Chest Port 1 View  07/08/2013   CLINICAL DATA:  Left-sided chest pain  EXAM: PORTABLE CHEST - 1 VIEW  COMPARISON:  07/05/2013  FINDINGS: Cardiac shadow is stable. Postoperative changes are again seen. The lungs are clear  IMPRESSION: No acute abnormality noted.   Electronically Signed   By: Alcide Clever M.D.   On: 07/08/2013 07:14     ASSESSMENT / PLAN:  PULMONARY A:Resp failure in setting of septic shock, abd distension, morbid obesity, suspected OSA. P:   ARDS protocol ventilation Change to continuous sedation  CARDIOVASCULAR A: septic shock Hx HTN P:  Volume resuscitation based on goal CVP 8-12 10-28 add vasopressin 10-28 check 12 lead to assess possible afib 10-28 order 2 echo for lv function  RENAL Lab Results  Component Value Date   CREATININE 0.51 07/09/2013   CREATININE 0.61 07/08/2013   CREATININE 0.62 07/07/2013   A:  Metabolic acidosis  P:   Currently compensating with MV Follow lactate  GASTROINTESTINAL A:  Post lap chole Perforation Hepatic flexure, s/p resection. Left in discontinuity due to inability to form an ostomy P:   10-25 post lap chole.  10-27 back to OR with perf'd bowel. Wound vac in place 10-28 ? Transport to [facility] due to atypical anatomy and difficulty achieving ostomy placement NGT -> drainage JP  to drainage CCS managing transfer to [facility]  HEMATOLOGIC Lab Results  Component Value Date   INR 1.55* 07/08/2013    Recent Labs  07/08/13 2018 07/09/13 0556  HGB 10.2* 15.8   A: Post op anemia/ coagulopathy,  complicated by fluid resusitation P:  -blood and cryo as needed -tx as needed  INFECTIOUS A: cholecystitis, consider ascending cholangitis or other operative sequelae. GB was not totally removed at [facility].  Peritonitis  Proteus UTI P:   On zosyn Add micafungin 10/28  ENDOCRINE A:  No acute issue   P:   Consider empiric hydrocort, check am random cortisol  NEUROLOGIC A:  Paraplegia secondary to spina bifida otherwise intact VP shunt, now contaminated s/p bowel perforation P:   Change to continuous sedation 10/28 Status of VP shunt now in question > no evidence of MS change, but he will need NSGY evaluation and consideration of VP shunt removal / replacement if he stabilizes.     Brett Canales Minor  ACNP [facility] Pager [phone] till 3 pm If no answer page [phone] 07/09/2013, 7:47 AM  CC time 50 minutes  Levy Pupa, MD, PhD 07/09/2013, 9:21 AM [facility] [phone] or if no answer [phone]

## 2013-07-09 NOTE — Discharge Summary (Signed)
Physician Discharge Summary  Patient ID: [name] MRN: [id number] DOB/AGE: 12-03-1980 32 y.o.  Admit date: 07/05/2013 Discharge date: 07/09/2013  Admission Diagnoses: acute cholecystitis  Discharge Diagnoses: acute cholecystitis, sepsis, colon perforation Principal Problem:   Acute cholecystitis Active Problems:   UTI (lower urinary tract infection)   Spina bifida   Neurogenic bladder   Elevated LFTs   Acute respiratory failure   Septic shock   Discharged Condition: critical  Hospital Course: Admitted 07/05/13 for suspected acute cholecystitis.  Placed on antibiotics.  Discussed with NSG exteriorizing the shunt and they recommended leaving in place and proceeding with planned procedure.  Taken to OR 07/06/13 for lap cholecystectomy.  See op note.  He had difficult anatomy and tight working space and significant amount of inflammatory change in RUQ.  Subtotal cholecystectomy performed and drain placed.  Early morning on POD 2 he became tachypnic and tachycardic and he was transferred to ICU.  He was intubated and central line placed and CT chest performed to r/o PE which was negative and CT abdomen also performed to evaluate for abdominal sources.  His CT abdomen showed likely colon perforation and he was taken back to OR for exploratory laparotomy with abdominal washout, partial colectomy, and abdominal wound vac placement.  Unable to bring up colostomy or ileostomy at that time and bowels left in discontinuity.  He remains on zosyn for contamination with open abdomen.  He is on two pressors for HD but acidosis improving.  He is in critical condition but in discussing his case with critical care, he should be okay for transfer to [facility] for definitive surgical management.  Consults: neurosurgery, critical care, surgery  Significant Diagnostic Studies: CT angio chest (neg) 07/08/13, CT abdomen 07/08/13-colon perforation  Treatments: surgery: 07/06/13 lap cholecystectomy, 07/08/13  exploratory laparotomy, abdominal washout, partial colectomy, abdominal wound vac placement  Disposition: transfer to ICU at [facility] with accepting physician Dr. Hoover Brunette   Current facility-administered medications:0.9 %  sodium chloride infusion, , Intravenous, Continuous, Vilinda Blanks Minor, NP, Last Rate: 20 mL/hr at 07/08/13 1605;  0.9 %  sodium chloride infusion, , Intravenous, Continuous, Coralyn Helling, MD, Last Rate: 50 mL/hr at 07/08/13 2225, 1,000 mL at 07/08/13 2225;  artificial tears (LACRILUBE) ophthalmic ointment, , Both Eyes, Q3H PRN, Coralyn Helling, MD, 1 application at 07/09/13 0345 fentaNYL (SUBLIMAZE) injection 25-50 mcg, 25-50 mcg, Intravenous, Q2H PRN, Vilinda Blanks Minor, NP, 50 mcg at 07/09/13 0553;  heparin injection 5,000 Units, 5,000 Units, Subcutaneous, Q8H, Lodema Pilot, DO, 5,000 Units at 07/09/13 0548;  HYDROmorphone (DILAUDID) injection 0.25-0.5 mg, 0.25-0.5 mg, Intravenous, Q5 min PRN, Azell Der, MD, 0.5 mg at 07/09/13 0659 norepinephrine (LEVOPHED) 16 mg in dextrose 5 % 250 mL infusion, 2-50 mcg/min, Intravenous, Continuous, Vilinda Blanks Minor, NP, Last Rate: 46.9 mL/hr at 07/09/13 0434, 50 mcg/min at 07/09/13 0434;  ondansetron (ZOFRAN) injection 4 mg, 4 mg, Intravenous, Q6H PRN, Letha Cape, PA-C, 4 mg at 07/06/13 1106;  pantoprazole (PROTONIX) injection 40 mg, 40 mg, Intravenous, Daily, Leslye Peer, MD, 40 mg at 07/08/13 1452 phenylephrine (NEO-SYNEPHRINE) 10,000 mcg in dextrose 5 % 250 mL infusion, 30-200 mcg/min, Intravenous, Titrated, Coralyn Helling, MD, Last Rate: 195 mL/hr at 07/09/13 0644, 130 mcg/min at 07/09/13 0644;  piperacillin-tazobactam (ZOSYN) IVPB 3.375 g, 3.375 g, Intravenous, Q8H, Letha Cape, PA-C, Last Rate: 12.5 mL/hr at 07/09/13 0242, 3.375 g at 07/09/13 0242 vasopressin (PITRESSIN) 50 Units in sodium chloride 0.9 % 250 mL infusion, 0.03 Units/min, Intravenous, Continuous, Vilinda Blanks Minor, NP,  Last Rate: 9 mL/hr at 07/09/13 0902, 0.03 Units/min at  07/09/13 0902   Signed: Lodema Pilot DAVID 07/09/2013, 8:50 AM

## 2013-07-09 NOTE — Progress Notes (Signed)
Patient went to OR emergently last night for perforated colon and underwent exploratory laparoscopy with partial colectomy, abdominal washout and abdominal wound vac.  ERCP is the least of concerns at this point, but LFT's continue to improve (except for AST).  I believe that he is going to be transferred to [facility] for further surgical management.  Signing off from GI standpoint.  GI ATTENDING  Events noted. As above. No role for GI at present. Will sign off  Sreekar Broyhill N. Eda Keys., M.D. [facility] Division of Gastroenterology

## 2013-07-09 NOTE — Op Note (Signed)
NAME:  [name], [name] NO.:  [id number]  MEDICAL RECORD NO.:  [id number]  LOCATION:  1223                         FACILITY:  [facility]  PHYSICIAN:  Lodema Pilot, MD       DATE OF BIRTH:  09-29-1980  DATE OF PROCEDURE:  07/08/2013 DATE OF DISCHARGE:                              OPERATIVE REPORT   PROCEDURE:  Diagnostic laparoscopy with exploratory laparotomy, partial colectomy, abdominal washout and placement of abdominal wound VAC.  SURGEON:  Lodema Pilot  ASSISTANT:  Dr. Gerrit Friends  PREOPERATIVE DIAGNOSIS:  Colon perforation.  POSTOPERATIVE DIAGNOSIS:  Colon perforation.  ANESTHESIA:  General endotracheal anesthesia.  ESTIMATED BLOOD LOSS:  500 mL.  FLUIDS:  9 L of crystalloid, 3 units of blood and 1 unit of FFP, 250 mL of albumin.  SPECIMENS:  Transverse colon resected and not sent to Pathology.  COMPLICATIONS:  None apparent.  FINDINGS:  Perforation of the proximal transverse colon with free spillage of stool and contamination.  Variant anatomy of the colon with a variable course of the transverse colon, seemingly posterior to the stomach and posterior to the distal duodenum and ligament of Treitz. Proximal and distal colon stapled off and not left in continuity.  No evidence of duodenal injury, but the anatomy is difficult given the patient's body habitus.  Distended small bowel with matted loops of small bowel in the mid abdomen, shortened mesentery and loss of domain with placement of abdominal wound VAC.  INDICATIONS FOR PROCEDURE:  [name] is a 32 year old male with presumed acute cholecystitis, who underwent laparoscopic cholecystectomy two days ago.  At time of his initial surgery, he had a significant inflammatory change in the right upper quadrant and we felt that this was due to primary acute cholecystitis.  He underwent laparoscopic cholecystectomy and last night began having acute onset of tachypnea and tachycardia.  CT scan of the  chest was negative for pulmonary embolus. CT scan of the abdomen was concerning for colon perforation.  The patient was seen and evaluated in the intensive care unit after being transferred to intensive care unit earlier in the day.  The patient was intubated and unable to consent for himself.  I discussed the findings and concerns with the patient's mother and informed consent was obtained.  He was already on therapeutic antibiotics and he was taken emergently to the operating room and placed on the table in the supine position.  General anesthesia was obtained and his abdomen was prepped and draped in a standard surgical fashion.  He was taped to the table in order to position him laparoscopically and procedure time-out was performed with all operative team members to confirm proper patient and procedure.  I opened up his prior umbilical trocar site and placed a 12 mm balloon port into the abdomen.  I insufflated his abdomen with carbon dioxide gas and placed the laparoscope.  There was no working space within the abdomen.  The pressure alarm on the laparoscopic equipment kept alarming for over pressurization and I could not get any working space to evaluate the abdomen.  He had foul smelling output from the trocar site.  At this point, I opened the midline with the  laparotomy incision and opened the upper midline.  His abdomen is very tight and again it was difficult to get any exposure of the abdomen and I carried the incision down around the umbilicus and to the lower abdomen, carried this through his umbilical hernia mesh and this maximized the exposure, although given his body habitus and distended bowel, his large liver as well as his 10th rib which is right at the edge of the liver, visualization and retraction was difficult for the entire case, similar to his laparoscopic exposure.  We could see the source of the contamination coming from the right upper quadrant.  This was a  suspected colon injury.  We tried to identify the transverse colon in order to follow this towards the right upper quadrant, but we could not even identify the transverse colon.  We were able to identify the stomach and the first part of the duodenum, we were able to identify the proximal jejunum and ligament of Treitz, divided the ligament of Treitz and still could not identify the transverse colon in this area.  We could identify the descending colon coursing up towards the spleen and we could see the hepatic flexure very posteriorly up onto the liver and we could see the perforation and what was presumed to be the proximal transverse colon, with contamination of stool.  After we were confident that this was indeed a colon perforation and not coming from the duodenum, we created a window at the edge of the bowel just distal to the perforation and passed a Penrose drain for retraction.  We tried to perform upper endoscopy as well, to visualize the duodenum, but even the stomach and the anatomy and the outlet of the pylorus was really never even visualized well on endoscopy and this was abandoned in order to avoid insufflating the small bowel, even though we had the proximal jejunum clamps manually to avoid insufflation of the small bowel.  We could tell that there was no air leakage from the duodenum.  At this point, we decided to divide the colon distal to the perforation with a TA 60 blue stapler and the deep distal colon retracted back up under the stomach and what appeared to be into the lesser sac, although it was really never visualized well and never fully understood the course of the distal transverse colon.  We used the proximal staple Line as a handle to mobilize the proximal transverse colon, dissecting on the wall of the colon as to avoid injury to the duodenum or common bile duct or other surrounding structures.  We tried to carry this up towards the hepatic flexure,  but again the exposure and mobilization was not possible due to the lack of compliance of the abdomen and the ribs and the liver.  We were able to mobilize proximal to the area of visualized perforation and this was again transected with another firing of the TA stapler removing about 5 inch segment of the colon.  This was removed, but not sent to Pathology. We tried to identify the ascending colon and the right white line of Toldt, but again this was so far posterior and with the distended small bowel, which could not be eviscerated because it was all matted and distended, we could never fully identify the cecum and to be sure that we could mobilize this safely, we tried to follow the small intestine to the ileocecal valve, but again this was not really possible due to the matted small bowel.  There  were a few free loops of small intestine, but we could not identify exactly which loops of bowel these were.  We were trying to identify the loop of ileum or right colon that could be brought up for an ostomy proximally, but at this time, the patient was really not very hemodynamically stable, and we could not physically mobilize his bowel to expose the ascending colon to mobilize it and we felt at this time that since the apparent source of contamination was appeared to be controlled that it would be best to abandon this proximal diversion in order to resuscitate and see if we could restore some hemodynamics.  At this time, we washed out as much of the abdomen as we could.  The intraperitoneal portion of the VP shunt was freely floating within all of the contamination.  We did speak with the neurosurgeon on call, and he recommended just leaving the shunt as it is and dealing with the contamination and he would monitor the patient and exteriorize if necessary.  We also asked about the negative pressure therapy on the shunt and again no further recommendations were given with regard to  management of the shunt.  After we washed him out, we placed an abdominal wound VAC and transferred him back to the intensive care unit for further resuscitation and management by critical care.  We had considered other options for proximal diversion including a tube colostomy or bringing up a random loop of small bowel and again because of his hemodynamics, we felt that the best is to leave him in discontinuity and return for repeat washout and hopefully for definitive ostomy if we were able to get exposure.  All sponge, needle, instrument counts correct at the end of the case and the patient was critically ill and transferred back to intensive care unit in critical condition.          ______________________________ Lodema Pilot, DO     BL/MEDQ  D:  07/08/2013  T:  07/09/2013  Job:  [id number]

## 2013-07-10 LAB — MRSA CULTURE

## 2013-07-11 LAB — CULTURE, RESPIRATORY W GRAM STAIN: Special Requests: NORMAL

## 2013-07-11 LAB — CULTURE, RESPIRATORY

## 2013-07-12 LAB — TYPE AND SCREEN
Unit division: 0
Unit division: 0
Unit division: 0
Unit division: 0
Unit division: 0
Unit division: 0

## 2013-07-15 LAB — CULTURE, BLOOD (ROUTINE X 2): Culture: NO GROWTH

## 2013-07-21 ENCOUNTER — Telehealth (INDEPENDENT_AMBULATORY_CARE_PROVIDER_SITE_OTHER): Payer: Self-pay | Admitting: General Surgery

## 2013-07-21 NOTE — Telephone Encounter (Signed)
I called his mother to see how he was doing.  She said that he is doing well and now has an ostomy.  He still has open abdomen but planning on surgery to hopefully close him tomorrow.  She seems to be in good spirits.

## 2013-08-07 ENCOUNTER — Other Ambulatory Visit: Payer: Self-pay

## 2013-08-07 ENCOUNTER — Non-Acute Institutional Stay (SKILLED_NURSING_FACILITY): Payer: PRIVATE HEALTH INSURANCE | Admitting: Internal Medicine

## 2013-08-07 DIAGNOSIS — Z9049 Acquired absence of other specified parts of digestive tract: Secondary | ICD-10-CM

## 2013-08-07 DIAGNOSIS — A419 Sepsis, unspecified organism: Secondary | ICD-10-CM

## 2013-08-07 DIAGNOSIS — R0682 Tachypnea, not elsewhere classified: Secondary | ICD-10-CM

## 2013-08-07 DIAGNOSIS — Z9889 Other specified postprocedural states: Secondary | ICD-10-CM

## 2013-08-07 MED ORDER — OXYCODONE HCL 5 MG PO TABS: ORAL_TABLET | ORAL | Status: DC

## 2013-08-07 NOTE — Progress Notes (Signed)
Patient ID: [name], male   DOB: 1980/11/03, 32 y.o.   MRN: [id number]  Facility; [facility] Chief complaint; admission to SNF post admit to [facility] from October 28 to November 25. His original surgery was at [facility].  History; this is a 32 year old man with a history of spina bifida at the thoracic region he is paraplegic. As I understand things was living at home with his mother he able to use the electric wheelchair etc. He also has a history of a VP shunt. This man was admitted to hospital on October 24 which what was felt to be cholecystitis and he underwent a laparoscopic cholecystectomy. He became increasingly septic postoperatively and eventually there was concern for colonic perforation. He went back to the OR on October 28 and was discovered to have 2 perforations in the right colon. He underwent a right hemicolectomy. He required pressor support and was transported to [facility] for further care. By this time the patient was critically ill intubated and on vasopressors. He was taken to the OR by general surgery on October 29 and had a bowel resection creation of colostomy. Overall 95 cm of ileum and jejunum were resected due to nonviable small bowel and he has been left with 140 cm of small bowel. His VP shunt had to be externalized stent. He went back to the operating room multiple times for abdominal washout. He continued postoperative fevers and tachycardia he was placed on meropenem and Diflucan however his cultures continued to be negative and antibiotics were discontinued. His ileostomy output remained elevated likely secondary to short gut. He required aggressive IV fluid to maintain his fluid balance. He was taken back to the OR by neurosurgery with a PA shunt placement on November 11 the per he went back to the operating room on November 13 and had a piece vicryl mesh to close his abdominal wound. And he had a wound VAC  placed over this period he had runs of SVT. Lomotil and Imodium were added to the medication to decrease his ostomy output.  Past medical history/problem list #1 current admission with sepsis secondary to colon perforation at the time of the laparoscopic cholecystectomy #2 spina bifida of the thoracic region with subsequent paraplegia. #3 chronic UTIs the patient apparently points incontinently #4 status post laparoscopic cholecystectomy right hemicolectomy, small bowel resection and placement of an ileostomy I. #5 acute respiratory failure requiring intubation now extubated #5 hypertension #6 severe protein calorie malnutrition #7 now with placement of a VA shunt to replace his previous VP shunt  Past Surgical History  Procedure Laterality Date  . Back surgery      x2  . Hernia repair      umbilical hernia  . Ventriculoperitoneal shunt      revised 30yrs ago  . Hip surgery    . Testicle surgery      hx undescended testicles  . Cholecystectomy N/A 07/06/2013    Procedure: LAPAROSCOPIC CHOLECYSTECTOMY WITH INTRAOPERATIVE CHOLANGIOGRAM;  Surgeon: Lodema Pilot, DO;  Location: [facility];  Service: General;  Laterality: N/A;  . Colon resection N/A 07/08/2013    Procedure: EXPLORATORY LAPAROSCOPY, DIAGNOSTIC LAPAROSCOPY, PARTIAL COLECTOMY, ABDOMINAL WASHOUT, ABDOMINAL WOUND VAC;  Surgeon: Lodema Pilot, DO;  Location: [facility];  Service: General;  Laterality: N/A;     Medications; Lomotil 2 tablets 4 times daily, Imodium 2 tablets 4 times a day, but Toprolol 25 mg 2 times daily for 30 days, oxycodone when necessary,  Prilosec 40 mg every morning, vitamin D2 50,000 units once a week  Social history; patient tells me he lives at home with his mother. Use an electric wheelchair able to transfer himself from bed to chair. Not completely certain of his exact functional status  reports that he has never smoked. He has never used smokeless tobacco. He reports that he does not drink alcohol or use illicit  drugs.  Review of systems Respiratory; he is not complaining of cough or shortness of breath Cardiac no complaints of chest pain GI; not completely certain of ostomy output since he arrived here yesterday 300 cc so far today GU he is incontinent  Physical examination Gen. patient is awake and responsive. Not in any distress Vitals O2 sat 97% on room air, pulse rate 96 respiratory rate 33 he is afebrile Respiratory shallow air entry bilaterally but no crackles or wheezes there is no stridor Cardiac heart sounds are normal there is no murmurs he does not appear to be dehydrated Abdomen; overall abdomen is distended however bowel sounds are positive he has a large ventral wound across most of his abdomen the base of this appears to have question ACEL cell on the top of it he is currently has a wound VACC. There is a small draining area to the right of the surgical incision that probes deeply I have cultured this there is a scant amount of purulent discharge there is no overt tenderness; ostomy site looks stable the ostomy is collected to a draining bag for measurement GU small wound on his penis there is no evidence of bladder Skin; fortunately there does not appear to be any pressure areas  Impression/plan #1 severe sepsis secondary to an initial perforation of his right colon at the time of laparoscopic cholecystectomy. He developed refractory septic shock small bowel necrosis requiring multiple surgeries. He is now left with an ileostomy the large ventral abdominal wound for which he is receiving a wound VAC.  #2 apparently copious amounts of ileostomy output. There are orders for IV fluid to keep up with the ostomy output he has a PICC line in the left arm #3 tachypnea; this could have multiple possible etiologies including mechanical restriction, pain, response to acidosis. His respiratory status otherwise seems to be normal. Do not think he has impending sepsis #4 history of spina bifida with  chronic paralysis. The patient tells me he was quite function although he refers a lot of my questions to his mother #5 draining site from the right abdomen which I am assuming was a site of a previous drain or perhaps part of his original surgery. We'll need to watch this. Culture done of what appears to be purulent drainage no further antibiotics for now #6 conversion of a VP to a VA shunt.

## 2013-08-13 ENCOUNTER — Non-Acute Institutional Stay (SKILLED_NURSING_FACILITY): Payer: PRIVATE HEALTH INSURANCE | Admitting: Internal Medicine

## 2013-08-13 DIAGNOSIS — D631 Anemia in chronic kidney disease: Secondary | ICD-10-CM

## 2013-08-13 DIAGNOSIS — R748 Abnormal levels of other serum enzymes: Secondary | ICD-10-CM

## 2013-09-17 ENCOUNTER — Non-Acute Institutional Stay (SKILLED_NURSING_FACILITY): Payer: PRIVATE HEALTH INSURANCE | Admitting: Internal Medicine

## 2013-09-17 DIAGNOSIS — K219 Gastro-esophageal reflux disease without esophagitis: Secondary | ICD-10-CM

## 2013-09-17 DIAGNOSIS — E43 Unspecified severe protein-calorie malnutrition: Secondary | ICD-10-CM

## 2013-09-17 DIAGNOSIS — I10 Essential (primary) hypertension: Secondary | ICD-10-CM

## 2013-09-17 DIAGNOSIS — D638 Anemia in other chronic diseases classified elsewhere: Secondary | ICD-10-CM | POA: Insufficient documentation

## 2013-09-17 NOTE — Progress Notes (Signed)
       PROGRESS NOTE  DATE: 09/17/2013  FACILITY: Nursing Home Location: [facility]  LEVEL OF CARE: SNF (31)  Routine Visit  CHIEF COMPLAINT:  Manage GERD, hypertension and protein calorie malnutrition  HISTORY OF PRESENT ILLNESS:  REASSESSMENT OF ONGOING PROBLEM(S):  GERD: pt's GERD is stable.  Denies ongoing heartburn, abd. Pain, nausea or vomiting.  Currently on a PPI & tolerates it without any adverse reactions. I was requested by the pharmacy consultant to assess the patient for continued need for Prilosec.  HTN: Pt 's HTN remains stable.  Denies CP, sob, DOE, pedal edema, headaches, dizziness or visual disturbances.  No complications from the medications currently being used.  Last BP : 152/76  MALNUTRITION: Patient is currently receiving Protostat and tolerates it without any problems.  PAST MEDICAL HISTORY : Reviewed.  No changes.  CURRENT MEDICATIONS: Reviewed per [facility]  REVIEW OF SYSTEMS:  GENERAL: no change in appetite, no fatigue, no weight changes, no fever, chills or weakness RESPIRATORY: no cough, SOB, DOE, wheezing, hemoptysis CARDIAC: no chest pain, edema or palpitations GI: no abdominal pain, diarrhea, constipation, heart burn, nausea or vomiting  PHYSICAL EXAMINATION  VS:  T 98.4       P 82      RR 16      BP 152/76      WT (Lb) 170.9  GENERAL: no acute distress, moderately obese body habitus EYES: conjunctivae normal, sclerae normal, normal eye lids NECK: supple, trachea midline, no neck masses, no thyroid tenderness, no thyromegaly LYMPHATICS: no LAN in the neck, no supraclavicular LAN RESPIRATORY: breathing is even & unlabored, BS CTAB CARDIAC: RRR, no murmur,no extra heart sounds, no edema GI: abdomen soft, BS decreased, no masses, no tenderness, no hepatomegaly, no splenomegaly, colostomy bag present, midline abdominal incision dressed PSYCHIATRIC: the patient is alert & oriented to person, affect & behavior  appropriate  LABS/RADIOLOGY:  12-14 hemoglobin 9.7, MCV 81 otherwise CBC normal, total protein 5.8, albumin 2.4, alkaline phosphatase 157, AST 45, ALT 59 otherwise CMP normal  ASSESSMENT/PLAN:  GERD-stable. DC Prilosec and observe. Hypertension-uncontrolled. Increase metoprolol to 50 mg twice a day. Protein calorie malnutrition-continue Protostat Anemia of chronic disease-stable Elevated liver functions-monitor paraplegia-continue supportive care  CPT CODE: 49449

## 2013-09-20 DIAGNOSIS — R198 Other specified symptoms and signs involving the digestive system and abdomen: Secondary | ICD-10-CM

## 2013-09-20 DIAGNOSIS — T8189XA Other complications of procedures, not elsewhere classified, initial encounter: Secondary | ICD-10-CM

## 2013-09-20 DIAGNOSIS — Z9889 Other specified postprocedural states: Secondary | ICD-10-CM

## 2013-09-20 DIAGNOSIS — Z932 Ileostomy status: Secondary | ICD-10-CM

## 2013-09-21 ENCOUNTER — Non-Acute Institutional Stay (SKILLED_NURSING_FACILITY): Payer: PRIVATE HEALTH INSURANCE | Admitting: Internal Medicine

## 2013-09-21 DIAGNOSIS — T8189XA Other complications of procedures, not elsewhere classified, initial encounter: Secondary | ICD-10-CM

## 2013-09-21 DIAGNOSIS — Z9049 Acquired absence of other specified parts of digestive tract: Secondary | ICD-10-CM

## 2013-09-21 DIAGNOSIS — R198 Other specified symptoms and signs involving the digestive system and abdomen: Secondary | ICD-10-CM

## 2013-09-21 DIAGNOSIS — Z932 Ileostomy status: Secondary | ICD-10-CM

## 2013-09-24 ENCOUNTER — Non-Acute Institutional Stay (SKILLED_NURSING_FACILITY): Payer: PRIVATE HEALTH INSURANCE | Admitting: Internal Medicine

## 2013-09-24 DIAGNOSIS — D638 Anemia in other chronic diseases classified elsewhere: Secondary | ICD-10-CM

## 2013-09-24 DIAGNOSIS — E43 Unspecified severe protein-calorie malnutrition: Secondary | ICD-10-CM

## 2013-09-24 DIAGNOSIS — I10 Essential (primary) hypertension: Secondary | ICD-10-CM

## 2013-09-24 NOTE — Progress Notes (Signed)
              PROGRESS NOTE  DATE: 09-24-13  FACILITY: Nursing Home Location: [facility]  LEVEL OF CARE: SNF (31)  Discharge Visit  CHIEF COMPLAINT:  Manage hypertension and protein calorie malnutrition  HISTORY OF PRESENT ILLNESS: I was requested by the social worker to perform face-to-face evaluation for discharge.  REASSESSMENT OF ONGOING PROBLEM(S):  HTN: Pt 's HTN remains stable.  Denies CP, sob, DOE, pedal edema, headaches, dizziness or visual disturbances.  No complications from the medications currently being used.  Last BP : 152/76  MALNUTRITION: Patient is currently receiving Protostat and tolerates it without any problems.  PAST MEDICAL HISTORY : Reviewed.  No changes.  CURRENT MEDICATIONS: Reviewed per [facility]  REVIEW OF SYSTEMS:  GENERAL: no change in appetite, no fatigue, no weight changes, no fever, chills or weakness RESPIRATORY: no cough, SOB, DOE, wheezing, hemoptysis CARDIAC: no chest pain, edema or palpitations GI: no abdominal pain, diarrhea, constipation, heart burn, nausea or vomiting  PHYSICAL EXAMINATION  VS:  T 98.4       P 82      RR 16      BP 152/76      WT (Lb) 170.9  GENERAL: no acute distress, moderately obese body habitus EYES: conjunctivae normal, sclerae normal, normal eye lids NECK: supple, trachea midline, no neck masses, no thyroid tenderness, no thyromegaly LYMPHATICS: no LAN in the neck, no supraclavicular LAN RESPIRATORY: breathing is even & unlabored, BS CTAB CARDIAC: RRR, no murmur,no extra heart sounds, no edema GI: abdomen soft, BS decreased, no masses, no tenderness, no hepatomegaly, no splenomegaly, colostomy bag present, midline abdominal incision dressed PSYCHIATRIC: the patient is alert & oriented to person, affect & behavior appropriate  LABS/RADIOLOGY:  1-15 hemoglobin 11.5, MCV 75 otherwise CBC normal  12-14 hemoglobin 9.7, MCV 81 otherwise CBC normal, total protein 5.8, albumin 2.4, alkaline  phosphatase 157, AST 45, ALT 59 otherwise CMP normal  ASSESSMENT/PLAN:  Hypertension-uncontrolled.  metoprolol was increased  to 50 mg twice a day. Protein calorie malnutrition-continue Protostat Anemia of chronic disease-stable Elevated liver functions-monitor paraplegia-continue supportive care  I have filled out patient's discharge paperwork and prescriptions. She'll be receiving home health PT, OT, S. and an Education officer, museum. DME: Shower bench  He has cap Services: 280 undergarments, 175 underpads  Discharge time greater than 30 minutes Discharge time in coordination discharge process with nursing staff, therapy dept and social worker. Medical justification for home health services and DME verified.  CPT CODE: 10175

## 2013-09-30 ENCOUNTER — Encounter: Payer: Self-pay | Admitting: Internal Medicine

## 2013-09-30 DIAGNOSIS — N189 Chronic kidney disease, unspecified: Secondary | ICD-10-CM

## 2013-09-30 DIAGNOSIS — R748 Abnormal levels of other serum enzymes: Secondary | ICD-10-CM | POA: Insufficient documentation

## 2013-09-30 DIAGNOSIS — D631 Anemia in chronic kidney disease: Secondary | ICD-10-CM | POA: Insufficient documentation

## 2013-09-30 NOTE — Progress Notes (Signed)
Patient ID: [name], male   DOB: 02-12-1981, 33 y.o.   MRN: [id number]          PROGRESS NOTE  DATE: 08/13/2013    FACILITY:  [facility]  LEVEL OF CARE: SNF (31)  Acute Visit  CHIEF COMPLAINT:  Manage anemia of chronic disease and elevated liver functions.    HISTORY OF PRESENT ILLNESS: I was requested by the staff to assess the patient regarding above problem(s):  ANEMIA: The anemia is unstable. The patient denies fatigue, melena or hematochezia.  The patient is currently not on iron.   On 08/12/2013:  Hemoglobin 9.7, MCV 81.  In 06/2013:  Hemoglobin 13.9.    ELEVATED LIVER FUNCTIONS:  On 08/12/2013:  AST 45, ALT 59.  On 07/09/2013:  AST 157, ALT 190.    PAST MEDICAL HISTORY : Reviewed.  No changes.  CURRENT MEDICATIONS: Reviewed per [facility]  REVIEW OF SYSTEMS:  GENERAL: no change in appetite, no fatigue, no weight changes, no fever, chills or weakness RESPIRATORY: no cough, SOB, DOE,, wheezing, hemoptysis CARDIAC: no chest pain, edema or palpitations GI: no abdominal pain, diarrhea, constipation, heart burn, nausea or vomiting  PHYSICAL EXAMINATION  GENERAL: no acute distress, morbidly obese body habitus NECK: supple, trachea midline, no neck masses, no thyroid tenderness, no thyromegaly RESPIRATORY: breathing is even & unlabored, BS CTAB CARDIAC: RRR, no murmur,no extra heart sounds, no edema GI: abdomen soft, normal BS, no masses, no tenderness, no hepatomegaly, no splenomegaly, patient has a right lower quadrant colostomy and midline abdominal wound VAC   PSYCHIATRIC: the patient is alert & oriented to person, affect & behavior appropriate  ASSESSMENT/PLAN:  Anemia of chronic disease.  Unstable problem.  Hemoglobin declined.  We will monitor.    Elevated liver functions.  LFTs are trending down.  We will monitor.    THN Metrics:   Not on aspirin.  Never-smoker.  BP:  102/69.    CPT CODE: 71062

## 2013-09-30 NOTE — Progress Notes (Addendum)
Patient ID: [name], male   DOB: [birth year], 33 y.o.   MRN: [id number]                PROGRESS NOTE  DATE:  09/20/2013    FACILITY: [facility]    LEVEL OF CARE:   SNF   Acute Visit/Discharge Visit     CHIEF COMPLAINT:  Review of abdominal wound, ostomy output, and pre-discharge review.    HISTORY OF PRESENT ILLNESS:  This is a 33 year-old man who has spina bifida and has been a long-term thoracic region paraplegic.    He was admitted to hospital in late October, undergoing a laparoscopic cholecystectomy.  He ended up with a colonic perforation and had to undergo a right hemicolectomy.  He also had 95 cm of ileum and jejunum resected due to nonviable bowel.  He had been receiving wound care here and had a wound VAC for a long period of time to the original operative site.  He also has a PICC line in place through which he has been receiving 1 L of normal saline daily if his ostomy output is over 1000 cc in 24 hours.  Since this is almost always more than 1000 cc (nursing tells me 1300-1700 cc per day), he is receiving this liter of saline fairly regularly.    With regards to the original wound, I think this has gotten about as much benefit from the [facility] as he is going to get.  It is now superficial.  Staff tell me there is some greenish drainage which they have cultured.    PHYSICAL EXAMINATION:    GENERAL APPEARANCE:  The patient looks well.   HEENT:        MOUTH/THROAT:   Mucous membranes are moist.   CARDIOVASCULAR:  CARDIAC:   Heart sounds are normal.  He appears to be euvolemic.   GASTROINTESTINAL:  ABDOMEN:   The wound in the center part of his abdomen actually looks as though it has clean granulation.  There is epithelization around the edges.  However, this is going to take a long period of time to eventually close over unless somebody wants to do a skin graft over this.    I do not really see any evidence of infection here.    ASSESSMENT/PLAN:  Postsurgical wound.   This is still a large, albeit improving, abdominal wound.  Although there is a history of green drainage here, I see nothing that overtly looks infected.  I have changed the dressing here to Aquacel AG with ABD pads and I have asked for an abdominal binder to reduce the tension over the wound, especially when he is up in the chair.    Ostomy output, which is almost consistently in the 1300-1700 cc per 24 hours.  This is in spite of fairly regular loperamide.  He will need his lab work checked, especially his electrolytes.  I was hoping to consider additional fluid intake orally to allow [place] to discontinue the PICC line.  However, I am not certain at this point that this is going to be possible.  I have asked them to see if he will take Gatorade rather than having to worry about intravenous fluids and/or PICC lines at home.  Failing this he will need IV fluid through a Picc line much as they are doing now.  CPT CODE: 42595

## 2013-10-15 ENCOUNTER — Encounter (HOSPITAL_BASED_OUTPATIENT_CLINIC_OR_DEPARTMENT_OTHER): Payer: PRIVATE HEALTH INSURANCE | Attending: General Surgery

## 2013-10-15 DIAGNOSIS — Z9049 Acquired absence of other specified parts of digestive tract: Secondary | ICD-10-CM | POA: Insufficient documentation

## 2013-10-15 DIAGNOSIS — T8189XA Other complications of procedures, not elsewhere classified, initial encounter: Secondary | ICD-10-CM | POA: Insufficient documentation

## 2013-10-15 DIAGNOSIS — Z79899 Other long term (current) drug therapy: Secondary | ICD-10-CM | POA: Insufficient documentation

## 2013-10-15 DIAGNOSIS — Z932 Ileostomy status: Secondary | ICD-10-CM | POA: Insufficient documentation

## 2013-10-15 DIAGNOSIS — Y838 Other surgical procedures as the cause of abnormal reaction of the patient, or of later complication, without mention of misadventure at the time of the procedure: Secondary | ICD-10-CM | POA: Insufficient documentation

## 2013-10-15 DIAGNOSIS — G8220 Paraplegia, unspecified: Secondary | ICD-10-CM | POA: Insufficient documentation

## 2013-10-15 DIAGNOSIS — Q059 Spina bifida, unspecified: Secondary | ICD-10-CM | POA: Insufficient documentation

## 2013-10-16 NOTE — H&P (Signed)
NAME:  [name], LEHENBAUER NO.:  [id number]  MEDICAL RECORD NO.:  [id number]  LOCATION:  FOOT                         FACILITY:  [facility]  PHYSICIAN:  Elesa Hacker, M.D.        DATE OF BIRTH:  07-14-1981  DATE OF ADMISSION:  10/15/2013 DATE OF DISCHARGE:                             HISTORY & PHYSICAL   CHIEF COMPLAINT:  Wound in the abdominal wall.  HISTORY OF PRESENT ILLNESS:  A 33 year old male with spina bifida and paraplegia, underwent laparoscopic surgery in October 2014.  He underwent abdominal aspiration and removal of some colon several days later, then he was transferred to [facility] and underwent a right hemicolectomy with removal of some small intestine and an ileostomy.  He has had a non-healed wound since, this has been treated with various modalities including VAC, wet to dry, collagen application.  PAST MEDICAL HISTORY:  Significant for spina bifida with paraplegia, hypertension, colonic perforation, and colitis.  PAST SURGICAL HISTORY:  As in chief complaint.  REVIEW OF SYSTEMS:  No epilepsy, convulsion, tremor.  No heart, lung, or kidney disease.  MEDICATIONS:  Cefuroxime, loperamide, ondansetron, metoprolol, and multivitamins.  ALLERGIES:  Cipro, Lasix, and vancomycin.  SOCIAL HISTORY:  Cigarettes, none.  Alcohol, none.  REVIEW OF SYSTEMS:  As above.  PHYSICAL EXAMINATION:  VITAL SIGNS:  Temperature 97.7, pulse 112, respirations 18, blood pressure 99/62. GENERAL:  The patient is awake and alert. CHEST:  Clear. HEART:  Regular rhythm.  There is right lower quadrant ileostomy which I did not undress.  This is a 26 cm x 8 cm wound approximately 1 cm deep.  The surface of this wound is mostly bright red granulation tissue, a few sutures were removed that were spitting out of the wound.  There is some epithelialization at the borders.  IMPRESSION:  Non-healed abdominal wound.  We will start treating with collagen.  I will ask Dr. Migdalia Dk  to see whether or not she thinks a skin graft may be indicated and we will consider the [facility] sometime in the future.  We will see him in 7 days.     Elesa Hacker, M.D.     RA/MEDQ  D:  10/15/2013  T:  10/16/2013  Job:  [id number]

## 2013-10-30 ENCOUNTER — Encounter (HOSPITAL_BASED_OUTPATIENT_CLINIC_OR_DEPARTMENT_OTHER): Payer: Self-pay | Admitting: *Deleted

## 2013-11-01 ENCOUNTER — Other Ambulatory Visit: Payer: Self-pay | Admitting: Plastic Surgery

## 2013-11-01 ENCOUNTER — Encounter (HOSPITAL_BASED_OUTPATIENT_CLINIC_OR_DEPARTMENT_OTHER): Payer: Self-pay | Admitting: *Deleted

## 2013-11-01 DIAGNOSIS — L98499 Non-pressure chronic ulcer of skin of other sites with unspecified severity: Secondary | ICD-10-CM

## 2013-11-01 NOTE — Progress Notes (Signed)
11/01/13 1100  OBSTRUCTIVE SLEEP APNEA  Have you ever been diagnosed with sleep apnea through a sleep study? No  Do you snore loudly (loud enough to be heard through closed doors)?  1  Do you often feel tired, fatigued, or sleepy during the daytime? 0  Has anyone observed you stop breathing during your sleep? 0  Do you have, or are you being treated for high blood pressure? 1  BMI more than 35 kg/m2? 0  Age over 31 years old? 0  Neck circumference greater than 40 cm/18 inches? 1  Gender: 1  Obstructive Sleep Apnea Score 4  Score 4 or greater  Results sent to PCP

## 2013-11-01 NOTE — Progress Notes (Signed)
SPOKE W/ PT MOTHER. PT W/C BOUND, DUE TO SPINA BIFIDA, HE CAN TRANSFER SELF WITHOUT BOARD. THEY WILL BE USING SCAT FOR TRANSPORTATION.  ARRIVE AT 1200. NEEDS ISTAT.  CURRENT EKG IN EPIC AND CHART. WILL TAKE METOPROLOL AND IF NEEDED OXYCODONE AM DOS W/ SIPS OF WATER.  WILL BRING VAC AND SUPPLIES . ALSO, WILL BRING ILEOSTOMY SUPPLIES , IN CASE NEEDS CHANGING.

## 2013-11-01 NOTE — H&P (Signed)
[name] is an 32 y.o. male.   Chief Complaint: Abdominal ulcer HPI: The patient is a 32 yrs old male here for a history and physical for surgical intervention for the abdominal wound.  Her underwent extensive abdominal surgery and has been left with an abdominal wound.  It is pink with granulation tissue but has been slow to heal with very little progress over the past several weeks.  He presents for further care.  Past Medical History  Diagnosis Date  . Hypertension   . GERD (gastroesophageal reflux disease)   . Non-healing surgical wound     ABDOMINE  . 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 Arnold-Chiari malformation   . Neurogenic bladder     INCONTINENT -  . 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 Ileostomy in place   . Wheelchair bound     CAN TRASFER SELF WITHOUT BOARD  . Urinary incontinence with continuous leakage   . Wears glasses   . Pressure ulcer, buttock   . Borderline diabetes     DIET CONTROLLED  . Heart palpitations     SECONDARY TO VP SHUNT DRAIN  . History of seizure as newborn     NO MEDS SINCE 33 YRS OLD--  NO ISSUES SINCE  . Neurogenic bowel   . At risk for sleep apnea     STOP-BANG = 4   SENT TO PCP 11-01-2013    Past Surgical History  Procedure Laterality Date  . Ventriculoperitoneal shunt  LAST REVISION NOV 2014    DUE TO ABD WOUND--- SHUNT DRAINS TO HEART  . Hip surgery Bilateral AS CHILD    TENDON RELEASE  . Cholecystectomy N/A 07/06/2013    Procedure: LAPAROSCOPIC CHOLECYSTECTOMY WITH INTRAOPERATIVE CHOLANGIOGRAM;  Surgeon: Brian Layton, DO;  Location: [facility];  Service: General;  Laterality: N/A;  . Colon resection N/A 07/08/2013    Procedure: EXPLORATORY LAPAROSCOPY, DIAGNOSTIC LAPAROSCOPY, PARTIAL COLECTOMY, ABDOMINAL WASHOUT, ABDOMINAL WOUND VAC;  Surgeon: Brian Layton, DO;  Location: [facility];  Service: General;  Laterality: N/A;  . Spinal  fixation surgery w/ implant  AS CHILD    [place] RODS  . 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 Umbilical hernia repair  03-30-2007  . Right colectomy/  appendectomy/  ileostomy  NOV 2014  [facility]    No family history on file. Social History:  reports that he has never smoked. He has never used smokeless tobacco. He reports that he does not drink alcohol or use illicit drugs.  Allergies:  Allergies  Allergen Reactions  . Latex Hives  . Adhesive [Tape] Hives  . Ciprofloxacin Other (See Comments)    IV only-- caused burning in arm   . Vancomycin Itching and Swelling     (Not in a hospital admission)  No results found for this or any previous visit (from the past 48 hour(s)). No results found.  Review of Systems  Constitutional: Negative.   HENT: Negative.   Eyes: Negative.   Respiratory: Negative.   Cardiovascular: Negative.   Gastrointestinal: Negative.   Genitourinary: Negative.   Musculoskeletal: Negative.   Skin: Negative.   Neurological: Negative.     There were no vitals taken for this visit. Physical Exam  Constitutional: He appears well-developed and well-nourished.  HENT:  Head: Normocephalic and atraumatic.  Eyes: Conjunctivae and EOM are normal. Pupils are equal, round,   and reactive to light.  Cardiovascular: Normal rate.   Respiratory: Effort normal.  Musculoskeletal: Normal range of motion.  Neurological: He is alert.  Skin: Skin is warm.  Psychiatric: He has a normal mood and affect. His behavior is normal. Judgment and thought content normal.     Assessment/Plan Recommend excision with Acell and VAC placement.  [name],Zachary Miyasato 11/01/2013, 4:10 PM

## 2013-11-04 ENCOUNTER — Ambulatory Visit (HOSPITAL_BASED_OUTPATIENT_CLINIC_OR_DEPARTMENT_OTHER)
Admission: RE | Admit: 2013-11-04 | Discharge: 2013-11-04 | Disposition: A | Discharge status: Home / Self Care | Payer: PRIVATE HEALTH INSURANCE | Source: Ambulatory Visit | Attending: Plastic Surgery | Admitting: Plastic Surgery

## 2013-11-04 ENCOUNTER — Encounter (HOSPITAL_BASED_OUTPATIENT_CLINIC_OR_DEPARTMENT_OTHER): Payer: PRIVATE HEALTH INSURANCE | Admitting: Anesthesiology

## 2013-11-04 ENCOUNTER — Encounter (HOSPITAL_BASED_OUTPATIENT_CLINIC_OR_DEPARTMENT_OTHER): Payer: Self-pay | Admitting: *Deleted

## 2013-11-04 ENCOUNTER — Ambulatory Visit (HOSPITAL_BASED_OUTPATIENT_CLINIC_OR_DEPARTMENT_OTHER): Payer: PRIVATE HEALTH INSURANCE | Admitting: Anesthesiology

## 2013-11-04 ENCOUNTER — Encounter (HOSPITAL_BASED_OUTPATIENT_CLINIC_OR_DEPARTMENT_OTHER)
Admission: RE | Disposition: A | Discharge status: Home / Self Care | Payer: Self-pay | Source: Ambulatory Visit | Attending: Plastic Surgery

## 2013-11-04 DIAGNOSIS — Z91040 Latex allergy status: Secondary | ICD-10-CM | POA: Diagnosis not present

## 2013-11-04 DIAGNOSIS — Q052 Lumbar spina bifida with hydrocephalus: Secondary | ICD-10-CM | POA: Insufficient documentation

## 2013-11-04 DIAGNOSIS — L98499 Non-pressure chronic ulcer of skin of other sites with unspecified severity: Secondary | ICD-10-CM | POA: Diagnosis present

## 2013-11-04 DIAGNOSIS — Z982 Presence of cerebrospinal fluid drainage device: Secondary | ICD-10-CM | POA: Insufficient documentation

## 2013-11-04 DIAGNOSIS — R002 Palpitations: Secondary | ICD-10-CM | POA: Insufficient documentation

## 2013-11-04 DIAGNOSIS — K592 Neurogenic bowel, not elsewhere classified: Secondary | ICD-10-CM | POA: Diagnosis not present

## 2013-11-04 DIAGNOSIS — Z932 Ileostomy status: Secondary | ICD-10-CM | POA: Insufficient documentation

## 2013-11-04 DIAGNOSIS — Z883 Allergy status to other anti-infective agents status: Secondary | ICD-10-CM | POA: Insufficient documentation

## 2013-11-04 DIAGNOSIS — Z9089 Acquired absence of other organs: Secondary | ICD-10-CM | POA: Diagnosis not present

## 2013-11-04 DIAGNOSIS — Z993 Dependence on wheelchair: Secondary | ICD-10-CM | POA: Insufficient documentation

## 2013-11-04 DIAGNOSIS — R7309 Other abnormal glucose: Secondary | ICD-10-CM | POA: Diagnosis not present

## 2013-11-04 DIAGNOSIS — N319 Neuromuscular dysfunction of bladder, unspecified: Secondary | ICD-10-CM | POA: Insufficient documentation

## 2013-11-04 DIAGNOSIS — I10 Essential (primary) hypertension: Secondary | ICD-10-CM | POA: Insufficient documentation

## 2013-11-04 DIAGNOSIS — K219 Gastro-esophageal reflux disease without esophagitis: Secondary | ICD-10-CM | POA: Diagnosis not present

## 2013-11-04 HISTORY — DX: Other cerebral palsy: G80.8

## 2013-11-04 HISTORY — DX: Neurogenic bowel, not elsewhere classified: K59.2

## 2013-11-04 HISTORY — DX: Continuous leakage: N39.45

## 2013-11-04 HISTORY — DX: Palpitations: R00.2

## 2013-11-04 HISTORY — DX: Dependence on wheelchair: Z99.3

## 2013-11-04 HISTORY — PX: INCISION AND DRAINAGE OF WOUND: SHX1803

## 2013-11-04 HISTORY — DX: Presence of spectacles and contact lenses: Z97.3

## 2013-11-04 HISTORY — DX: Gastro-esophageal reflux disease without esophagitis: K21.9

## 2013-11-04 HISTORY — DX: Lumbar spina bifida with hydrocephalus: Q05.2

## 2013-11-04 HISTORY — DX: Arnold-Chiari syndrome without spina bifida or hydrocephalus: Q07.00

## 2013-11-04 HISTORY — DX: Neuromuscular dysfunction of bladder, unspecified: N31.9

## 2013-11-04 HISTORY — DX: Ileostomy status: Z93.2

## 2013-11-04 LAB — POCT I-STAT 4, (NA,K, GLUC, HGB,HCT)
GLUCOSE: 93 mg/dL (ref 70–99)
HCT: 51 % (ref 39.0–52.0)
Hemoglobin: 17.3 g/dL — ABNORMAL HIGH (ref 13.0–17.0)
Potassium: 4.6 mEq/L (ref 3.7–5.3)
SODIUM: 131 meq/L — AB (ref 137–147)

## 2013-11-04 SURGERY — IRRIGATION AND DEBRIDEMENT WOUND
Anesthesia: General | Site: Abdomen

## 2013-11-04 MED ORDER — MIDAZOLAM HCL 2 MG/2ML IJ SOLN
INTRAMUSCULAR | Status: AC
  Filled 2013-11-04: qty 2

## 2013-11-04 MED ORDER — LACTATED RINGERS IV SOLN
INTRAVENOUS | Status: DC
  Administered 2013-11-04: 14:00:00 via INTRAVENOUS
  Filled 2013-11-04: qty 1000

## 2013-11-04 MED ORDER — FENTANYL CITRATE 0.05 MG/ML IJ SOLN
INTRAMUSCULAR | Status: AC
  Filled 2013-11-04: qty 4

## 2013-11-04 MED ORDER — ONDANSETRON HCL 4 MG/2ML IJ SOLN
INTRAMUSCULAR | Status: DC | PRN
  Administered 2013-11-04: 4 mg via INTRAVENOUS

## 2013-11-04 MED ORDER — HYDROMORPHONE HCL PF 1 MG/ML IJ SOLN
0.2500 mg | INTRAMUSCULAR | Status: DC | PRN
  Filled 2013-11-04: qty 1

## 2013-11-04 MED ORDER — POLYMYXIN B SULFATE 500000 UNITS IJ SOLR
INTRAMUSCULAR | Status: DC | PRN
  Administered 2013-11-04: 15:00:00

## 2013-11-04 MED ORDER — DEXAMETHASONE SODIUM PHOSPHATE 4 MG/ML IJ SOLN
INTRAMUSCULAR | Status: DC | PRN
  Administered 2013-11-04: 10 mg via INTRAVENOUS

## 2013-11-04 MED ORDER — FENTANYL CITRATE 0.05 MG/ML IJ SOLN
INTRAMUSCULAR | Status: DC | PRN
Start: 2013-11-04 — End: 2013-11-04
  Administered 2013-11-04: 50 ug via INTRAVENOUS

## 2013-11-04 MED ORDER — CEFAZOLIN SODIUM-DEXTROSE 2-3 GM-% IV SOLR
2.0000 g | INTRAVENOUS | Status: AC
  Administered 2013-11-04: 2 g via INTRAVENOUS
  Filled 2013-11-04: qty 50

## 2013-11-04 MED ORDER — LIDOCAINE HCL (CARDIAC) 20 MG/ML IV SOLN
INTRAVENOUS | Status: DC | PRN
  Administered 2013-11-04: 50 mg via INTRAVENOUS

## 2013-11-04 MED ORDER — MIDAZOLAM HCL 5 MG/5ML IJ SOLN
INTRAMUSCULAR | Status: DC | PRN
  Administered 2013-11-04: 2 mg via INTRAVENOUS

## 2013-11-04 MED ORDER — PROPOFOL 10 MG/ML IV BOLUS
INTRAVENOUS | Status: DC | PRN
  Administered 2013-11-04: 250 mg via INTRAVENOUS

## 2013-11-04 MED ORDER — LACTATED RINGERS IV SOLN
INTRAVENOUS | Status: DC
  Filled 2013-11-04: qty 1000

## 2013-11-04 MED ORDER — PROMETHAZINE HCL 25 MG/ML IJ SOLN
6.2500 mg | INTRAMUSCULAR | Status: DC | PRN
  Filled 2013-11-04: qty 1

## 2013-11-04 SURGICAL SUPPLY — 95 items
APL SKNCLS STERI-STRIP NONHPOA (GAUZE/BANDAGES/DRESSINGS)
BAG DECANTER FOR FLEXI CONT (MISCELLANEOUS) IMPLANT
BANDAGE ELASTIC 3 VELCRO ST LF (GAUZE/BANDAGES/DRESSINGS) IMPLANT
BANDAGE ELASTIC 4 VELCRO ST LF (GAUZE/BANDAGES/DRESSINGS) IMPLANT
BANDAGE ELASTIC 6 VELCRO ST LF (GAUZE/BANDAGES/DRESSINGS) IMPLANT
BENZOIN TINCTURE PRP APPL 2/3 (GAUZE/BANDAGES/DRESSINGS) IMPLANT
BLADE MINI RND TIP GREEN BEAV (BLADE) IMPLANT
BLADE SURG 10 STRL SS (BLADE) ×2 IMPLANT
BLADE SURG 15 STRL LF DISP TIS (BLADE) ×1 IMPLANT
BLADE SURG 15 STRL SS (BLADE) ×3
BNDG CMPR 9X4 STRL LF SNTH (GAUZE/BANDAGES/DRESSINGS)
BNDG COHESIVE 1X5 TAN STRL LF (GAUZE/BANDAGES/DRESSINGS) IMPLANT
BNDG COHESIVE 4X5 TAN NS LF (GAUZE/BANDAGES/DRESSINGS) ×3 IMPLANT
BNDG ESMARK 4X9 LF (GAUZE/BANDAGES/DRESSINGS) IMPLANT
BNDG GAUZE ELAST 4 BULKY (GAUZE/BANDAGES/DRESSINGS) IMPLANT
CANISTER OMNI JUG 16 LITER (MISCELLANEOUS) IMPLANT
CANISTER SUCTION 1200CC (MISCELLANEOUS) IMPLANT
CANISTER SUCTION 2500CC (MISCELLANEOUS) IMPLANT
CHLORAPREP W/TINT 26ML (MISCELLANEOUS) IMPLANT
CLOSURE WOUND 1/2 X4 (GAUZE/BANDAGES/DRESSINGS)
CLOTH BEACON ORANGE TIMEOUT ST (SAFETY) ×3 IMPLANT
CORDS BIPOLAR (ELECTRODE) IMPLANT
COVER MAYO STAND STRL (DRAPES) ×3 IMPLANT
COVER TABLE BACK 60X90 (DRAPES) ×3 IMPLANT
DECANTER SPIKE VIAL GLASS SM (MISCELLANEOUS) IMPLANT
DRAIN PENROSE 18X1/2 LTX STRL (DRAIN) ×1 IMPLANT
DRAPE EXTREMITY T 121X128X90 (DRAPE) IMPLANT
DRAPE INCISE IOBAN 66X45 STRL (DRAPES) ×2 IMPLANT
DRAPE LG THREE QUARTER DISP (DRAPES) IMPLANT
DRSG EMULSION OIL 3X3 NADH (GAUZE/BANDAGES/DRESSINGS) IMPLANT
ELECT NDL TIP 2.8 STRL (NEEDLE) IMPLANT
ELECT NEEDLE TIP 2.8 STRL (NEEDLE) IMPLANT
ELECT REM PT RETURN 9FT ADLT (ELECTROSURGICAL) ×3
ELECTRODE REM PT RTRN 9FT ADLT (ELECTROSURGICAL) ×1 IMPLANT
GAUZE SPONGE 4X4 12PLY STRL LF (GAUZE/BANDAGES/DRESSINGS) IMPLANT
GAUZE XEROFORM 1X8 LF (GAUZE/BANDAGES/DRESSINGS) IMPLANT
GAUZE XEROFORM 5X9 LF (GAUZE/BANDAGES/DRESSINGS) ×2 IMPLANT
GLOVE BIO SURGEON STRL SZ 6.5 (GLOVE) ×2 IMPLANT
GLOVE BIO SURGEONS STRL SZ 6.5 (GLOVE) ×1
GOWN PREVENTION PLUS XLARGE (GOWN DISPOSABLE) ×1 IMPLANT
GOWN STRL REUS W/ TWL LRG LVL3 (GOWN DISPOSABLE) IMPLANT
GOWN STRL REUS W/TWL LRG LVL3 (GOWN DISPOSABLE) ×9
HANDPIECE INTERPULSE COAX TIP (DISPOSABLE)
IV NS IRRIG 3000ML ARTHROMATIC (IV SOLUTION) IMPLANT
MATRIX SURGICAL PSM 10X15CM (Tissue) ×2 IMPLANT
MICROMATRIX 1000MG (Tissue) ×3 IMPLANT
NDL HYPO 30GX1 BEV (NEEDLE) IMPLANT
NEEDLE 27GAX1X1/2 (NEEDLE) IMPLANT
NEEDLE HYPO 30GX1 BEV (NEEDLE) IMPLANT
NS IRRIG 1000ML POUR BTL (IV SOLUTION) ×3 IMPLANT
PACK BASIN DAY SURGERY FS (CUSTOM PROCEDURE TRAY) ×3 IMPLANT
PAD ABD 8X10 STRL (GAUZE/BANDAGES/DRESSINGS) IMPLANT
PADDING CAST ABS 3INX4YD NS (CAST SUPPLIES)
PADDING CAST ABS 4INX4YD NS (CAST SUPPLIES)
PADDING CAST ABS COTTON 3X4 (CAST SUPPLIES) IMPLANT
PADDING CAST ABS COTTON 4X4 ST (CAST SUPPLIES) IMPLANT
PENCIL BUTTON HOLSTER BLD 10FT (ELECTRODE) IMPLANT
SET HNDPC FAN SPRY TIP SCT (DISPOSABLE) IMPLANT
SLEEVE SCD COMPRESS KNEE MED (MISCELLANEOUS) IMPLANT
SOLUTION PARTIC MCRMTRX 1000MG (Tissue) IMPLANT
SPLINT PLASTER CAST XFAST 3X15 (CAST SUPPLIES) IMPLANT
SPLINT PLASTER XTRA FASTSET 3X (CAST SUPPLIES)
SPONGE GAUZE 4X4 12PLY (GAUZE/BANDAGES/DRESSINGS) ×3 IMPLANT
SPONGE LAP 18X18 X RAY DECT (DISPOSABLE) IMPLANT
SPONGE LAP 4X18 X RAY DECT (DISPOSABLE) IMPLANT
STAPLER VISISTAT 35W (STAPLE) IMPLANT
STOCKINETTE 4X48 STRL (DRAPES) IMPLANT
STOCKINETTE 6  STRL (DRAPES) ×2
STOCKINETTE 6 STRL (DRAPES) ×1 IMPLANT
STOCKINETTE IMPERVIOUS LG (DRAPES) IMPLANT
STRIP CLOSURE SKIN 1/2X4 (GAUZE/BANDAGES/DRESSINGS) IMPLANT
SUCTION FRAZIER TIP 10 FR DISP (SUCTIONS) IMPLANT
SURGILUBE 2OZ TUBE FLIPTOP (MISCELLANEOUS) IMPLANT
SUT ETHILON 3 0 PS 1 (SUTURE) IMPLANT
SUT ETHILON 4 0 P 3 18 (SUTURE) IMPLANT
SUT ETHILON 5 0 PS 2 18 (SUTURE) IMPLANT
SUT PROLENE 3 0 PS 2 (SUTURE) IMPLANT
SUT SILK 3 0 PS 1 (SUTURE) IMPLANT
SUT VIC AB 3-0 FS2 27 (SUTURE) IMPLANT
SUT VIC AB 5-0 P-3 18X BRD (SUTURE) IMPLANT
SUT VIC AB 5-0 P3 18 (SUTURE) ×12
SUT VIC AB 5-0 PS2 18 (SUTURE) IMPLANT
SYR BULB IRRIGATION 50ML (SYRINGE) IMPLANT
SYR CONTROL 10ML LL (SYRINGE) ×3 IMPLANT
TAPE HYPAFIX 6 X30' (GAUZE/BANDAGES/DRESSINGS)
TAPE HYPAFIX 6X30 (GAUZE/BANDAGES/DRESSINGS) IMPLANT
TIP FLEX 45CM EVICEL (HEMOSTASIS) IMPLANT
TIP RIGID 35CM EVICEL (HEMOSTASIS) IMPLANT
TOWEL OR 17X24 6PK STRL BLUE (TOWEL DISPOSABLE) ×3 IMPLANT
TRAY DSU PREP LF (CUSTOM PROCEDURE TRAY) IMPLANT
TUBE CONNECTING 12'X1/4 (SUCTIONS)
TUBE CONNECTING 12X1/4 (SUCTIONS) IMPLANT
UNDERPAD 30X30 INCONTINENT (UNDERPADS AND DIAPERS) ×3 IMPLANT
WATER STERILE IRR 1000ML POUR (IV SOLUTION) ×3 IMPLANT
YANKAUER SUCT BULB TIP NO VENT (SUCTIONS) IMPLANT

## 2013-11-04 NOTE — Interval H&P Note (Signed)
History and Physical Interval Note:  11/04/2013 2:03 PM  [name]  has presented today for surgery, with the diagnosis of NON HEALING ABDOMINAL WOUND  The various methods of treatment have been discussed with the patient and family. After consideration of risks, benefits and other options for treatment, the patient has consented to  Procedure(s): IRRIGATION AND DEBRIDEMENT ABDOMINAL WOUND WITH PLACEMENT OF ACELL/VAC (N/A) as a surgical intervention .  The patient's history has been reviewed, patient examined, no change in status, stable for surgery.  I have reviewed the patient's chart and labs.  Questions were answered to the patient's satisfaction.     SANGER,CLAIRE

## 2013-11-04 NOTE — Anesthesia Procedure Notes (Signed)
Procedure Name: LMA Insertion Date/Time: 11/04/2013 2:36 PM Performed by: Bethena Roys T Pre-anesthesia Checklist: Patient identified, Emergency Drugs available, Suction available and Patient being monitored Patient Re-evaluated:Patient Re-evaluated prior to inductionOxygen Delivery Method: Circle System Utilized Preoxygenation: Pre-oxygenation with 100% oxygen Intubation Type: IV induction Ventilation: Mask ventilation without difficulty LMA: LMA inserted LMA Size: 5.0 Number of attempts: 1 Airway Equipment and Method: bite block Placement Confirmation: positive ETCO2 Dental Injury: Teeth and Oropharynx as per pre-operative assessment

## 2013-11-04 NOTE — H&P (View-Only) (Signed)
[name] is an 33 y.o. male.   Chief Complaint: Abdominal ulcer HPI: The patient is a 33 yrs old male here for a history and physical for surgical intervention for the abdominal wound.  Her underwent extensive abdominal surgery and has been left with an abdominal wound.  It is pink with granulation tissue but has been slow to heal with very little progress over the past several weeks.  He presents for further care.  Past Medical History  Diagnosis Date  . Hypertension   . GERD (gastroesophageal reflux disease)   . Non-healing surgical wound     ABDOMINE  . 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 Arnold-Chiari malformation   . Neurogenic bladder     INCONTINENT -  . 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 Ileostomy in place   . Wheelchair bound     CAN TRASFER SELF WITHOUT BOARD  . Urinary incontinence with continuous leakage   . Wears glasses   . Pressure ulcer, buttock   . Borderline diabetes     DIET CONTROLLED  . Heart palpitations     SECONDARY TO VP SHUNT DRAIN  . History of seizure as newborn     NO MEDS SINCE 33 YRS OLD--  NO ISSUES SINCE  . Neurogenic bowel   . At risk for sleep apnea     STOP-BANG = 4   SENT TO PCP 11-01-2013    Past Surgical History  Procedure Laterality Date  . Ventriculoperitoneal shunt  LAST REVISION NOV 2014    DUE TO ABD WOUND--- SHUNT DRAINS TO HEART  . Hip surgery Bilateral AS CHILD    TENDON RELEASE  . Cholecystectomy N/A 07/06/2013    Procedure: LAPAROSCOPIC CHOLECYSTECTOMY WITH INTRAOPERATIVE CHOLANGIOGRAM;  Surgeon: Madilyn Hook, DO;  Location: [facility];  Service: General;  Laterality: N/A;  . Colon resection N/A 07/08/2013    Procedure: EXPLORATORY LAPAROSCOPY, DIAGNOSTIC LAPAROSCOPY, PARTIAL COLECTOMY, ABDOMINAL WASHOUT, ABDOMINAL WOUND VAC;  Surgeon: Madilyn Hook, DO;  Location: [facility];  Service: General;  Laterality: N/A;  . Spinal  fixation surgery w/ implant  AS CHILD    [place] RODS  . 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 Umbilical hernia repair  03-30-2007  . Right colectomy/  appendectomy/  ileostomy  NOV 2014  [facility]    No family history on file. Social History:  reports that he has never smoked. He has never used smokeless tobacco. He reports that he does not drink alcohol or use illicit drugs.  Allergies:  Allergies  Allergen Reactions  . Latex Hives  . Adhesive [Tape] Hives  . Ciprofloxacin Other (See Comments)    IV only-- caused burning in arm   . Vancomycin Itching and Swelling     (Not in a hospital admission)  No results found for this or any previous visit (from the past 48 hour(s)). No results found.  Review of Systems  Constitutional: Negative.   HENT: Negative.   Eyes: Negative.   Respiratory: Negative.   Cardiovascular: Negative.   Gastrointestinal: Negative.   Genitourinary: Negative.   Musculoskeletal: Negative.   Skin: Negative.   Neurological: Negative.     There were no vitals taken for this visit. Physical Exam  Constitutional: He appears well-developed and well-nourished.  HENT:  Head: Normocephalic and atraumatic.  Eyes: Conjunctivae and EOM are normal. Pupils are equal, round,  and reactive to light.  Cardiovascular: Normal rate.   Respiratory: Effort normal.  Musculoskeletal: Normal range of motion.  Neurological: He is alert.  Skin: Skin is warm.  Psychiatric: He has a normal mood and affect. His behavior is normal. Judgment and thought content normal.     Assessment/Plan Recommend excision with Acell and VAC placement.  SANGER,CLAIRE 11/01/2013, 4:10 PM

## 2013-11-04 NOTE — Anesthesia Preprocedure Evaluation (Signed)
Anesthesia Evaluation  Patient identified by MRN, date of birth, ID band Patient awake    Reviewed: Allergy & Precautions, H&P , NPO status , Patient's Chart, lab work & pertinent test results  Airway Mallampati: III TM Distance: >3 FB Neck ROM: Limited    Dental  (+) Teeth Intact, Poor Dentition, Dental Advisory Given   Pulmonary neg pulmonary ROS,  breath sounds clear to auscultation  + decreased breath sounds      Cardiovascular hypertension, Pt. on home beta blockers negative cardio ROS  Rhythm:Regular Rate:Normal     Neuro/Psych Seizures -, Well Controlled,  VP shunt secondary to hydrocephalus as child; patient paralyzed from mid-abdomen down. negative psych ROS   GI/Hepatic negative GI ROS, Neg liver ROS, GERD-  ,  Endo/Other  negative endocrine ROS  Renal/GU negative Renal ROS  negative genitourinary   Musculoskeletal negative musculoskeletal ROS (+)   Abdominal   Peds  Hematology negative hematology ROS (+) anemia ,   Anesthesia Other Findings Diminished inspiratory effort secondary to RUQ pain  Reproductive/Obstetrics                           Anesthesia Physical Anesthesia Plan  ASA: III  Anesthesia Plan: General   Post-op Pain Management:    Induction: Intravenous  Airway Management Planned: LMA  Additional Equipment:   Intra-op Plan:   Post-operative Plan: Extubation in OR  Informed Consent: I have reviewed the patients History and Physical, chart, labs and discussed the procedure including the risks, benefits and alternatives for the proposed anesthesia with the patient or authorized representative who has indicated his/her understanding and acceptance.   Dental advisory given  Plan Discussed with: CRNA  Anesthesia Plan Comments:         Anesthesia Quick Evaluation                                  Anesthesia Evaluation  Patient identified by MRN, date of  birth, ID band Patient awake    Reviewed: Allergy & Precautions, H&P , NPO status , Patient's Chart, lab work & pertinent test results  Airway Mallampati: II TM Distance: >3 FB Neck ROM: Full   Comment: Intubated earlier today. Dental no notable dental hx.    Pulmonary neg pulmonary ROS,  breath sounds clear to auscultation  Pulmonary exam normal       Cardiovascular hypertension, Pt. on medications Rhythm:Regular Rate:Normal     Neuro/Psych negative neurological ROS  negative psych ROS   GI/Hepatic Neg liver ROS, GERD-  Medicated,  Endo/Other  negative endocrine ROS  Renal/GU negative Renal ROS  negative genitourinary   Musculoskeletal negative musculoskeletal ROS (+)   Abdominal (+) + obese,   Peds negative pediatric ROS (+)  Hematology negative hematology ROS (+)   Anesthesia Other Findings   Reproductive/Obstetrics negative OB ROS                           Anesthesia Physical Anesthesia Plan  ASA: IV and emergent  Anesthesia Plan: General   Post-op Pain Management:    Induction: Intravenous  Airway Management Planned: Oral ETT  Additional Equipment:   Intra-op Plan:   Post-operative Plan: Post-operative intubation/ventilation  Informed Consent: I have reviewed the patients History and Physical, chart, labs and discussed the procedure including the risks, benefits and alternatives for the proposed anesthesia with the patient  or authorized representative who has indicated his/her understanding and acceptance.     Plan Discussed with: CRNA  Anesthesia Plan Comments: (Intubated in ICU. Low blood pressure. CVC in place. Na 125. Hgb 10.1. Elevated LFTs. Levophed at 35 mcg/min. Vent. Settings: TV 450, FiO2 0.7 PEEP 5 RR 16. Last blood pressure 83/48. HR 125. O2Sat 100%.  Discussed critical condition with patient's mother.  Will attempt arterial line placement.)      Anesthesia Quick Evaluation

## 2013-11-04 NOTE — Progress Notes (Signed)
Pt transfers well with minimal assist.

## 2013-11-04 NOTE — Brief Op Note (Signed)
11/04/2013  3:11 PM  PATIENT:  [name]  33 y.o. male  PRE-OPERATIVE DIAGNOSIS:  NON HEALING ABDOMINAL WOUND  POST-OPERATIVE DIAGNOSIS:  NON HEALING ABDOMINAL WOUND  PROCEDURE:  Procedure(s): IRRIGATION AND DEBRIDEMENT ABDOMINAL WOUND WITH PLACEMENT OF ACELL/VAC (N/A)  SURGEON:  Surgeon(s) and Role:    * Sherman Lipuma Sanger, DO - Primary  PHYSICIAN ASSISTANT: Shawn  Rayburn, PA  ASSISTANTS: none   ANESTHESIA:   general  EBL:  Total I/O In: 200 [I.V.:200] Out: -   BLOOD ADMINISTERED:none  DRAINS: none   LOCAL MEDICATIONS USED:  NONE  SPECIMEN:  No Specimen  DISPOSITION OF SPECIMEN:  N/A  COUNTS:  YES  TOURNIQUET:  * No tourniquets in log *  DICTATION: .Dragon Dictation  PLAN OF CARE: Discharge to home after PACU  PATIENT DISPOSITION:  PACU - hemodynamically stable.   Delay start of Pharmacological VTE agent (>24hrs) due to surgical blood loss or risk of bleeding: no

## 2013-11-04 NOTE — Op Note (Signed)
Operative Note   DATE OF OPERATION: 11/04/2013  LOCATION: [place]  SURGICAL DIVISION: Plastic Surgery  PREOPERATIVE DIAGNOSES:  Abdominal ulcer chronic  POSTOPERATIVE DIAGNOSES:  same  PROCEDURE:  Preparation of abdominal ulcer for placement of Acell (1 gm and 10 x 15 sheet) and the VAC (7 x 30 cm)  SURGEON: [employer], DO  ASSISTANT: Shawn Rayburn, PA  ANESTHESIA:  General.   COMPLICATIONS: None.   INDICATIONS FOR PROCEDURE:  The patient, [name], is a 33 y.o. male born on September 16, 1980, is here for treatment of an abdominal ulcer.   CONSENT:  Informed consent was obtained directly from the patient. Risks, benefits and alternatives were fully discussed. Specific risks including but not limited to bleeding, infection, hematoma, seroma, scarring, pain, implant infection, implant extrusion, capsular contracture, asymmetry, wound healing problems, and need for further surgery were all discussed. The patient did have an ample opportunity to have questions answered to satisfaction.   DESCRIPTION OF PROCEDURE:  The patient was taken to the operating room. SCDs were placed and IV antibiotics were given. The patient's operative site was prepped and draped in a sterile fashion. A time out was performed and all information was confirmed to be correct.  General anesthesia was administered.  The area was debrided with #15 blade and cleaned with antibiotic solution.  Hemostasis was achieved with pressure.  The acell powder was placed followed by the sheet.  The sheet was secured with 5-0 Vicryl.  Slits were placed in the acell.  The adaptic was applied followed by the surgical lube.  The VAC was placed with coban and set at 125 mmHg pressure.  The patient tolerated the procedure well.  There were no complications. The patient was allowed to wake from anesthesia, extubated and taken to the recovery room in satisfactory condition.

## 2013-11-04 NOTE — Discharge Instructions (Signed)
Post Anesthesia Home Care Instructions  Activity: Get plenty of rest for the remainder of the day. A responsible adult should stay with you for 24 hours following the procedure.  For the next 24 hours, DO NOT: -Drive a car -Paediatric nurse -Drink alcoholic beverages -Take any medication unless instructed by your physician -Make any legal decisions or sign important papers.  Meals: Start with liquid foods such as gelatin or soup. Progress to regular foods as tolerated. Avoid greasy, spicy, heavy foods. If nausea and/or vomiting occur, drink only clear liquids until the nausea and/or vomiting subsides. Call your physician if vomiting continues.  Special Instructions/Symptoms: Your throat may feel dry or sore from the anesthesia or the breathing tube placed in your throat during surgery. If this causes discomfort, gargle with warm salt water. The discomfort should disappear within 24 hours. Vacuum-Assisted Closure Therapy Home Guide Vacuum-assisted closure therapy (VAC therapy) is a device that helps wounds heal. It is used on wounds that cannot be closed with stitches. They often heal slowly. VAC therapy helps the wound stay clean and healthy while its edges slowly grow back together. VAC therapy uses a bandage (dressing) that is made of foam. It is put inside the wound. Then, a drape is placed over the wound. This drape sticks to your skin (adhesive) to keep air out. A tube is hooked up to a small pump and is attached to the drape. The pump sucks fluid and germs from the wound. It can also decrease any bad smell that comes from the wound. RISKS AND COMPLICATIONS VAC therapy is usually safe to use at home. Your skin may get sore from the adhesive drape. That is the most common problem. However, more serious problems can develop, such as:   Bleeding. This can happen if the dressing in the wound comes into contact with blood vessels. A little bleeding may occur when the dressing is being  changed. This is normal now and then. Major bleeding can happen if a large blood vessel breaks. This is more likely if you are taking blood-thinning medicine. Emergency surgery may be needed.  Infection. This can happen if the dressing has an air leak that is not repaired within a couple of hours.  Dehydration. This can happen if the pump sucks out too much body fluid. DRESSING CHANGES Your dressing will have to be changed. Sometimes this is needed once a day. Other times, a dressing change must be done 3 times a week. How often you change your dressing will depend on what your wound is like. A trained caregiver will most likely change the dressing.   Do not allow anyone to change your dressing if they have an infection or a skin condition. Even a small cut can be a problem.   If it hurts when the dressing is changed, take pain medicine 30 minutes before the dressing change.  .Replace the container in the pump that collects fluid if it is full. Do this at least once per week.Your doctor will decide what setting of suction is best. Do not change the settings on the machine without talking to your nurse or doctor. HOME CARE INSTRUCTIONS   The VAC pump has an alarm. It goes off if there are any problems such as a leak.  Ask your caregiver what to do if the alarm goes off.  Call your caregiver right away if the alarm goes off and you cannot fix the problem.  Do not turn off the pump for more than 2  hours.  Check your wound carefully at each dressing change for signs of infection. Watch for redness, swelling, or any fluid leaking from the wound. If you develop an infection:  You may have to stop VAC therapy.  The wound will need to be cleaned and washed out.  You will have to take antibiotic medicine.  Ask your caregiver what activities you should or should not do while you are getting VAC therapy. This will depend on your particular wound.  Ask if it is okay to turn off the pump so you  can take a shower. If it is okay, make sure the wound is covered with plastic. The wound area must stay dry.  Drink enough fluids to keep your urine clear or pale yellow.  Eat foods that contain a lot of protein. Examples are meat, poultry, seafood, eggs, nuts, beans, and peas. Protein can help your wound heal. SEEK MEDICAL CARE IF:  Your wound itches or hurts.  Dressing changes are often painful or bleeding often occurs.  You have a headache.  You have diarrhea.  You have a sore throat.  You have a rash.  You feel nauseous.  You feel dizzy or weak. SEEK IMMEDIATE MEDICAL CARE IF:   You have very bad pain.  You have bleeding that will not stop.  Your wound smells bad.  You have redness, swelling, or fluid leaking from your wound.  Your alarm goes off and you do not know what to do.  You have a fever. Document Released: 11/21/2011 Document Reviewed: 11/21/2011 [facility] Patient Information 2014 [place].

## 2013-11-04 NOTE — Progress Notes (Signed)
[facility] Called for eta.  Transportation to come between 5 and 5:30 pm.  Spoke w [employer]. Asked that driver call nurse at the surgery center prior to arrival and we will transport him to the front.  Caryl Pina verbalized her understanding and said they would call.pt and mother informed.

## 2013-11-04 NOTE — Transfer of Care (Signed)
Immediate Anesthesia Transfer of Care Note  Patient: [name]  Procedure(s) Performed: Procedure(s): IRRIGATION AND DEBRIDEMENT ABDOMINAL WOUND WITH PLACEMENT OF ACELL/VAC (N/A)  Patient Location: PACU  Anesthesia Type:General  Level of Consciousness: sedated and responds to stimulation  Airway & Oxygen Therapy: Patient Spontanous Breathing and Patient connected to nasal cannula oxygen  Post-op Assessment: Post -op Vital signs reviewed and stable  Post vital signs: reviewed and stable  Complications: No apparent anesthesia complications

## 2013-11-05 ENCOUNTER — Encounter (HOSPITAL_BASED_OUTPATIENT_CLINIC_OR_DEPARTMENT_OTHER): Payer: Self-pay | Admitting: Plastic Surgery

## 2013-11-05 LAB — POCT I-STAT 4, (NA,K, GLUC, HGB,HCT)
Glucose, Bld: 95 mg/dL (ref 70–99)
HEMATOCRIT: 37 % — AB (ref 39.0–52.0)
HEMOGLOBIN: 12.6 g/dL — AB (ref 13.0–17.0)
Potassium: 7.5 mEq/L (ref 3.7–5.3)
Sodium: 126 mEq/L — ABNORMAL LOW (ref 137–147)

## 2013-11-05 NOTE — Anesthesia Postprocedure Evaluation (Signed)
  Anesthesia Post-op Note  Patient: [name]  Procedure(s) Performed: Procedure(s) (LRB): IRRIGATION AND DEBRIDEMENT ABDOMINAL WOUND WITH PLACEMENT OF ACELL/VAC (N/A)  Patient Location: PACU  Anesthesia Type: General  Level of Consciousness: awake and alert   Airway and Oxygen Therapy: Patient Spontanous Breathing  Post-op Pain: mild  Post-op Assessment: Post-op Vital signs reviewed, Patient's Cardiovascular Status Stable, Respiratory Function Stable, Patent Airway and No signs of Nausea or vomiting  Last Vitals:  Filed Vitals:   11/04/13 1616  BP:   Pulse:   Temp: 35.9 C  Resp:     Post-op Vital Signs: stable   Complications: No apparent anesthesia complications

## 2013-11-11 ENCOUNTER — Encounter (HOSPITAL_BASED_OUTPATIENT_CLINIC_OR_DEPARTMENT_OTHER): Payer: PRIVATE HEALTH INSURANCE | Attending: Plastic Surgery

## 2013-11-11 DIAGNOSIS — L98499 Non-pressure chronic ulcer of skin of other sites with unspecified severity: Secondary | ICD-10-CM | POA: Insufficient documentation

## 2013-11-12 NOTE — Progress Notes (Signed)
[facility]  NAME:  NASER, SCHULD NO.:  [id number]  MEDICAL RECORD NO.:  [id number]      DATE OF BIRTH:  [birth year]  PHYSICIAN:  Theodoro Kos, DO       VISIT DATE:  11/11/2013                                  OFFICE VISIT   The patient is a 33 year old male who is here for followup with his mom on his abdominal ulcer.  Overall, he is doing extremely well.  The ACell has nearly completely incorporated.  No sign of infection.  He has filled in.  The wound remarkably well, so we will continue with the Adaptic surgical lube and VAC, and see him back in 1 week.     Theodoro Kos, DO     CS/MEDQ  D:  11/11/2013  T:  11/12/2013  Job:  [id number]

## 2013-11-19 NOTE — Progress Notes (Signed)
[facility]  NAME:  ASHBY, MOSKAL NO.:  [id number]  MEDICAL RECORD NO.:  [id number]      DATE OF BIRTH:  20-May-1981  PHYSICIAN:  Theodoro Kos, DO       VISIT DATE:  11/18/2013                                  OFFICE VISIT   The patient is a 33 year old male who is here with his mother and other family member for a followup on his abdominal ulcer.  He has had ACell placed with Adaptic and a VAC.  The ACell was doing extremely well and has completely incorporated, so at this point, we will add collagen with the [facility] and see him back in 1 week.  In the meantime, he is encouraged to continue with multivitamin, vitamin C and zinc.  Thank you very much     Theodoro Kos, DO     CS/MEDQ  D:  11/18/2013  T:  11/19/2013  Job:  [id number]

## 2013-11-25 NOTE — Progress Notes (Signed)
[facility]  NAME:  TYRIC, RODEHEAVER NO.:  [id number]  MEDICAL RECORD NO.:  [id number]      DATE OF BIRTH:  1981-09-07  PHYSICIAN:  Theodoro Kos, DO       VISIT DATE:  11/25/2013                                  OFFICE VISIT   The patient is a 33 year old male who is here with his mother for followup on his chronic abdominal ulcer secondary to surgical intervention.  He is doing extremely well and filling in the abdominal ulcer in depth and he is starting to epithelialize as well, so marked improvement overall.  No change in medications or social history.  He has had the VAC on for the week with collagen underneath, and we will continue to place that collagen.  We will have him do 3 VAC changes in the next 2 weeks, and follow up with [place] in 2 weeks.  Continue with protein, multivitamin, vitamin C, zinc.     Theodoro Kos, DO     CS/MEDQ  D:  11/25/2013  T:  11/25/2013  Job:  [id number]

## 2013-12-09 NOTE — Progress Notes (Signed)
[facility]  NAME:  [name], [name] NO.:  [id number]  MEDICAL RECORD NO.:  [id number]      DATE OF BIRTH:  December 31, 1980  PHYSICIAN:  Theodoro Kos, DO       VISIT DATE:  12/09/2013                                  OFFICE VISIT   The patient is a 33 year old gentleman who is here for followup on his abdominal ulcer secondary to surgery.  Overall, he is doing very well. The area is healing well.  He has been undergoing VAC change at home. Today, we added Endoform under the [facility] and he will undergo VAC changes one more time this week, twice next week, and then see me back on Monday the week after.  In the meantime, continue with multivitamin, vitamin C, zinc, and keep the VAC on and we will see him in 2 weeks.     Theodoro Kos, DO     CS/MEDQ  D:  12/09/2013  T:  12/09/2013  Job:  [id number]

## 2013-12-23 ENCOUNTER — Encounter (HOSPITAL_BASED_OUTPATIENT_CLINIC_OR_DEPARTMENT_OTHER): Payer: PRIVATE HEALTH INSURANCE | Attending: Plastic Surgery

## 2013-12-23 DIAGNOSIS — L98499 Non-pressure chronic ulcer of skin of other sites with unspecified severity: Secondary | ICD-10-CM | POA: Insufficient documentation

## 2013-12-24 NOTE — Progress Notes (Signed)
[facility]  NAME:  [name], [name] NO.:  [id number]  MEDICAL RECORD NO.:  [id number]      DATE OF BIRTH:  1981-02-17  PHYSICIAN:  Theodoro Kos, DO       VISIT DATE:  12/23/2013                                  OFFICE VISIT   The patient is a 33 year old male who is here with his mom for followup on his chronic abdominal ulcer secondary to surgery.  He has been doing VAC changes twice a week and we have been putting Endoform on there for the past week.  He is doing very well.  The superior portion of the wound has healed up.  There is no sign of infection, and overall, it looks good.  We will continue with current treatment.  There is no change in medications or social history.  On exam, he is alert, oriented, and cooperative.  His mom is very aware of the process.  His pulse is regular.  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  He is large, but no sign of intraabdominal catastrophe at this time.  Endoform was placed with the VAC and we will continue with VAC dressings twice a week.     Theodoro Kos, DO     CS/MEDQ  D:  12/23/2013  T:  12/24/2013  Job:  [id number]

## 2014-01-06 DIAGNOSIS — L98499 Non-pressure chronic ulcer of skin of other sites with unspecified severity: Secondary | ICD-10-CM | POA: Diagnosis not present

## 2014-01-13 NOTE — Progress Notes (Signed)
[facility]  NAME:  [name], [name]                    ACCOUNT NO.:  MEDICAL RECORD NO.:  [id number]      DATE OF BIRTH:  1981-08-21  PHYSICIAN:  Theodoro Kos, DO       VISIT DATE:  01/06/2014                                  OFFICE VISIT   The patient is a 33 year old who is here for a followup on his chronic abdominal ulcer.  He is doing extremely well with a decrease in size overall of the ulcer and improvement in the depth of it.  An Endoform was placed last week.  I went ahead and put another one on today and will continue with the [facility] and followup in 2 weeks.     Theodoro Kos, DO     CS/MEDQ  D:  01/06/2014  T:  01/07/2014  Job:  [id number]

## 2014-01-20 ENCOUNTER — Encounter (HOSPITAL_BASED_OUTPATIENT_CLINIC_OR_DEPARTMENT_OTHER): Payer: PRIVATE HEALTH INSURANCE | Attending: Plastic Surgery

## 2014-01-20 DIAGNOSIS — L98499 Non-pressure chronic ulcer of skin of other sites with unspecified severity: Secondary | ICD-10-CM | POA: Diagnosis present

## 2014-01-21 NOTE — Progress Notes (Signed)
[facility]  NAME:  [name], [name] NO.:  [id number]  MEDICAL RECORD NO.:  [id number]      DATE OF BIRTH:  08/25/1981  PHYSICIAN:  Theodoro Kos, DO       VISIT DATE:  01/20/2014                                  OFFICE VISIT   HISTORY:  The patient is a 33 year old male who is here with his mom for followup on his abdominal ulcer, chronic, secondary to surgery.  He has had the VAC on with Endoform over the past week.  The area is very clean looking.  No sign of infection.  It is getting smaller slowly.  There has been no change in his medication.  REVIEW OF SYSTEMS:  Negative.  PHYSICAL EXAMINATION:  On exam, he is alert, oriented, and cooperative, not in any distress.  His mom is a very good historian.  His pupils are equal.  His breathing is unlabored and his heart rate is regular.  The wound is described above, is clean with no sign of infection.  A cell sheet was placed with Adaptic Hydrogel and a VAC sponge and they can change it in 1 week and we will see him back in 2 weeks.     Theodoro Kos, DO     CS/MEDQ  D:  01/20/2014  T:  01/21/2014  Job:  [id number]

## 2014-02-10 ENCOUNTER — Encounter (HOSPITAL_BASED_OUTPATIENT_CLINIC_OR_DEPARTMENT_OTHER): Payer: PRIVATE HEALTH INSURANCE | Attending: Plastic Surgery

## 2014-02-10 DIAGNOSIS — L98499 Non-pressure chronic ulcer of skin of other sites with unspecified severity: Secondary | ICD-10-CM | POA: Insufficient documentation

## 2014-02-11 NOTE — Progress Notes (Signed)
[facility]  NAME:  [name], CUTRONE NO.:  [id number]  MEDICAL RECORD NO.:  [id number]      DATE OF BIRTH:  1981/02/04  PHYSICIAN:  Irene Limbo, MD    VISIT DATE:  02/10/2014                                  OFFICE VISIT   The patient is a 33 year old male, here for abdominal wound.  The patient's current wound care has been wound VAC which has been completed both by home health and his mother.  He continues to contract the wound post surgical.  On examination, blood pressure is 111/74, pulse is 97, temperature is 97.9.  Wound size is measured at 11.5 x 2.9 x 0.1 cm.  The wound is completely granulated with no slough present.  No debridement was performed.  ASSESSMENT:  Given the continued contraction of the wound, we will plan to hold on the [facility] for this week and place collagen alone.  They will follow up in 1 week's time to assess whether they are ready for discontinuing the VAC.          ______________________________ Irene Limbo, MD     BT/MEDQ  D:  02/10/2014  T:  02/11/2014  Job:  [id number]

## 2014-02-17 DIAGNOSIS — L98499 Non-pressure chronic ulcer of skin of other sites with unspecified severity: Secondary | ICD-10-CM | POA: Diagnosis not present

## 2014-02-18 NOTE — Progress Notes (Signed)
[facility]  NAME:  KRISTINA, BERTONE NO.:  [id number]  MEDICAL RECORD NO.:  [id number]      DATE OF BIRTH:  [birth year]  PHYSICIAN:  Theodoro Kos, DO       VISIT DATE:  02/17/2014                                  OFFICE VISIT   The patient is a 33 year old male who is here for followup on his abdominal ulcer.  He was on collagen this past week and had a break from the [facility].  Overall, the area is getting smaller, it is taking time but it is showing signs of improvement.  There is no sign of infection.  PHYSICAL EXAMINATION:  GENERAL:  He is alert, oriented, cooperative, not in any acute distress.  He is pleasant. HEENT:  Pupils are equal.  Extraocular muscles are intact.  His glasses are on. NECK:  No lymphadenopathy. RESPIRATORY:  His breathing is unlabored.  The wound is clean and nontender.  No concerning drainage.  It is just about flush with the skin.  An Endoform was placed and the VAC was reapplied.  He will probably need the [facility] for a few more weeks and then we will go with dressings only.  We will see him back in 1 week.     Theodoro Kos, DO     CS/MEDQ  D:  02/17/2014  T:  02/18/2014  Job:  [id number]

## 2014-02-24 DIAGNOSIS — L98499 Non-pressure chronic ulcer of skin of other sites with unspecified severity: Secondary | ICD-10-CM | POA: Diagnosis not present

## 2014-02-25 NOTE — Progress Notes (Signed)
[facility]  NAME:  NIAM, NEPOMUCENO NO.:  [id number]  MEDICAL RECORD NO.:  [id number]      DATE OF BIRTH:  01-28-1981  PHYSICIAN:  Theodoro Kos, DO       VISIT DATE:  02/24/2014                                  OFFICE VISIT   The patient is a 33 year old male who is here for followup on his abdominal ulcer.  He underwent surgery with an open abdomen.  He has undergone debridement with ACell and VAC, and recently we transitioned to Endoform which he is doing extremely well.  No change in his medications or social history.  On exam, he is not very talkative, but his mom is there and very helpful.  He is alert, oriented, not in any distress.  No sign of infection.  The area is improving greatly in size. It is flushed with the surrounding skin, and we will continue with Endoform for today and then home health will transition him to collagen, and we will see him back in 2 weeks.     Theodoro Kos, DO     CS/MEDQ  D:  02/24/2014  T:  02/25/2014  Job:  [id number]

## 2014-03-10 DIAGNOSIS — L98499 Non-pressure chronic ulcer of skin of other sites with unspecified severity: Secondary | ICD-10-CM | POA: Diagnosis not present

## 2014-03-24 ENCOUNTER — Encounter (HOSPITAL_BASED_OUTPATIENT_CLINIC_OR_DEPARTMENT_OTHER): Payer: PRIVATE HEALTH INSURANCE | Attending: Plastic Surgery

## 2014-03-24 DIAGNOSIS — L89309 Pressure ulcer of unspecified buttock, unspecified stage: Secondary | ICD-10-CM | POA: Diagnosis present

## 2014-03-24 DIAGNOSIS — T8189XA Other complications of procedures, not elsewhere classified, initial encounter: Secondary | ICD-10-CM | POA: Insufficient documentation

## 2014-03-24 DIAGNOSIS — L8992 Pressure ulcer of unspecified site, stage 2: Secondary | ICD-10-CM | POA: Diagnosis not present

## 2014-03-24 DIAGNOSIS — Y849 Medical procedure, unspecified as the cause of abnormal reaction of the patient, or of later complication, without mention of misadventure at the time of the procedure: Secondary | ICD-10-CM | POA: Diagnosis not present

## 2014-04-07 DIAGNOSIS — L89309 Pressure ulcer of unspecified buttock, unspecified stage: Secondary | ICD-10-CM | POA: Diagnosis not present

## 2014-04-07 DIAGNOSIS — T8189XA Other complications of procedures, not elsewhere classified, initial encounter: Secondary | ICD-10-CM | POA: Diagnosis not present

## 2014-04-07 DIAGNOSIS — L8992 Pressure ulcer of unspecified site, stage 2: Secondary | ICD-10-CM | POA: Diagnosis not present

## 2014-04-08 NOTE — Progress Notes (Signed)
[facility]  NAME:  MARQUIST, BINSTOCK NO.:  [id number]  MEDICAL RECORD NO.:  [id number]      DATE OF BIRTH:  1981-05-11  PHYSICIAN:  Theodoro Kos, DO       VISIT DATE:  04/07/2014                                  OFFICE VISIT   The patient is a 33 year old male who is here for followup on his abdominal chronic ulcer. He has had a collagen, Endoform and a VAC.  The VAC was discontinued recently due to measurement issues but he is doing well and showing signs of improvement.  He unfortunately has a pressure ulcer on the thigh area likely from the scooting and getting the skin clot.  There is no change in his medications or social history.  On exam, he is alert, oriented, cooperative, not in any distress. Pupils are equal.  Extraocular muscles are intact.  No cervical lymphadenopathy.  His breathing is unlabored.  His heart rate is regular.  The wound on the abdomen is showing signs of improvement.  It is flushed with the surrounding skin and epithelializing and does not appear to be infected.  The sacral ischial thigh wound is superficial. Recommend offloading, multivitamin, vitamin C, zinc, collagen to both areas and follow up in 2 weeks.     Theodoro Kos, DO     CS/MEDQ  D:  04/07/2014  T:  04/08/2014  Job:  [id number]

## 2014-04-21 ENCOUNTER — Encounter (HOSPITAL_BASED_OUTPATIENT_CLINIC_OR_DEPARTMENT_OTHER): Payer: PRIVATE HEALTH INSURANCE | Attending: Plastic Surgery

## 2014-04-21 DIAGNOSIS — L8992 Pressure ulcer of unspecified site, stage 2: Secondary | ICD-10-CM | POA: Insufficient documentation

## 2014-04-21 DIAGNOSIS — L98499 Non-pressure chronic ulcer of skin of other sites with unspecified severity: Secondary | ICD-10-CM | POA: Diagnosis present

## 2014-04-21 DIAGNOSIS — L89309 Pressure ulcer of unspecified buttock, unspecified stage: Secondary | ICD-10-CM | POA: Diagnosis not present

## 2014-04-21 NOTE — Progress Notes (Signed)
[facility]  NAME:  [name], [name] NO.:  [id number]  MEDICAL RECORD NO.:  [id number]      DATE OF BIRTH:  June 15, 1981  PHYSICIAN:  Theodoro Kos, DO       VISIT DATE:  04/21/2014                                  OFFICE VISIT   The patient is a 33 year old gentleman, who is here with his mom for followup on his abdominal ulcer.  He has been using collagen on the area and it is showing very good signs of improvement.  It is filled in and flushed with the surrounding skin.  He has some areas that have epithelialize since the last visit.  There has been no change in his medications or social history.  Mom was also concerned about the break down of ischial skin area.  On exam, he is alert and cooperative.  He spent most of visit playing solitaire on his phone.  His mom helps with the history and update.  His breathing is unlabored.  His heart rate is regular.  The abdomen has improved and those notes are above.  The sacral ulcer is also improved, is granulated and epithelializing.  There is no sign of infection in either area.  Endoform was placed on the abdominal wound.  I recommend collagen on the sacral wound and follow up in 2 weeks.     Theodoro Kos, DO     CS/MEDQ  D:  04/21/2014  T:  04/21/2014  Job:  [id number]

## 2014-05-05 DIAGNOSIS — L98499 Non-pressure chronic ulcer of skin of other sites with unspecified severity: Secondary | ICD-10-CM | POA: Diagnosis not present

## 2014-05-05 DIAGNOSIS — L8992 Pressure ulcer of unspecified site, stage 2: Secondary | ICD-10-CM | POA: Diagnosis not present

## 2014-05-05 DIAGNOSIS — L89309 Pressure ulcer of unspecified buttock, unspecified stage: Secondary | ICD-10-CM | POA: Diagnosis not present

## 2014-05-05 NOTE — Progress Notes (Signed)
[facility]  NAME:  [name], [name] NO.:  [id number]  MEDICAL RECORD NO.:  [id number]      DATE OF BIRTH:  03-24-1981  PHYSICIAN:  Theodoro Kos, DO       VISIT DATE:  05/05/2014                                  OFFICE VISIT   The patient is a 33 year old male who is here for followup on his abdominal ulcer.  He has been using collagen and Endoform on and off. He is doing extremely well.  No change in his medications or social history.  On exam, he is alert, oriented, and cooperative.  His mother does most of the talking.  He is pretty quiet.  No change in medications.  The abdomen is showing great signs of improvement.  There is a little hypergranulation tissue on the bottom part of the wound and silver nitrate was used on that.  There is some epithelialization occurring between where the wound was and now there are couple separate wounds.  So, overall doing very well.  We will see him back in 3 weeks.  Continue with the collagen.     Theodoro Kos, DO     CS/MEDQ  D:  05/05/2014  T:  05/05/2014  Job:  [id number]

## 2014-05-26 ENCOUNTER — Encounter (HOSPITAL_BASED_OUTPATIENT_CLINIC_OR_DEPARTMENT_OTHER): Payer: PRIVATE HEALTH INSURANCE | Attending: Plastic Surgery

## 2014-05-26 DIAGNOSIS — L98499 Non-pressure chronic ulcer of skin of other sites with unspecified severity: Secondary | ICD-10-CM | POA: Insufficient documentation

## 2014-05-27 NOTE — Progress Notes (Signed)
[facility]  NAME:  [name], [name]                    ACCOUNT NO.:  MEDICAL RECORD NO.:  [id number]      DATE OF BIRTH:  07/31/1981  PHYSICIAN:  Theodoro Kos, DO       VISIT DATE:  05/26/2014                                  OFFICE VISIT   SUBJECTIVE:  The patient is a 33 year old with chronic abdominal ulcer. He is doing extremely well.  His mom is with him and is the historian. There is no change in his medications or social history.  OBJECTIVE:  GENERAL:  On exam; he is alert, oriented, pleasant. HEENT:  Glasses in place.  Pupils equal. RESPIRATORY:  Breathing unlabored. HEART:  Rate is regular. ABDOMEN:  Soft and nontender.  Colostomy in place. SKIN:  The wound is markedly improved much smaller than previously and is epithelializing.  IMPRESSION AND PLAN:  An Endoform was placed with the extras given to mom and we did ask her to change it every other day and we will see him back in 2 weeks.     Theodoro Kos, DO     CS/MEDQ  D:  05/26/2014  T:  05/26/2014  Job:  [id number]

## 2014-06-09 DIAGNOSIS — L98499 Non-pressure chronic ulcer of skin of other sites with unspecified severity: Secondary | ICD-10-CM | POA: Diagnosis not present

## 2014-06-12 NOTE — Progress Notes (Signed)
[facility]  NAME:  [name], [name] NO.:  [id number]  MEDICAL RECORD NO.:  [id number]      DATE OF BIRTH:  [birth year]  PHYSICIAN:  Theodoro Kos, DO       VISIT DATE:  06/09/2014                                  OFFICE VISIT   The patient is a 33 year old male who is here for followup on his chronic abdominal ulcer.  He has shown very good progress with healing. It is very superficial and just needs to epithelialize, but overall is improving greatly.  He has also had a small breakdown on his sacral area.  They were out and about for an extended period of time and unfortunately that broke down, so there was no change in his medications or social history.  Review of systems is negative.  On exam, he is alert, cooperative.  He interacted just a little more today.  His mom still is the primary history giver.  He overall is showing remarkable signs of improvement.  The wound is described above with just the need for epithelialization, no sign of infection.  The sacral area got a little worse, so that part will need to be offloaded. No other areas for concern noted.  His ostomy is in place and no sign of irritation around it.  His breathing is unlabored.  His heart rate is regular.  The plan is for collagen on the sacral area, Endoform on the abdomen and we will see him back in 3 weeks.  Continue with multivitamin, vitamin C, zinc, protein intake, and offloading.     Theodoro Kos, DO     CS/MEDQ  D:  06/09/2014  T:  06/10/2014  Job:  [id number]

## 2014-06-30 ENCOUNTER — Encounter (HOSPITAL_BASED_OUTPATIENT_CLINIC_OR_DEPARTMENT_OTHER): Payer: PRIVATE HEALTH INSURANCE | Attending: Plastic Surgery

## 2014-06-30 DIAGNOSIS — L98499 Non-pressure chronic ulcer of skin of other sites with unspecified severity: Secondary | ICD-10-CM | POA: Diagnosis present

## 2014-07-01 NOTE — Progress Notes (Signed)
[facility]  NAME:  [name], [name] NO.:  [id number]  MEDICAL RECORD NO.:  [id number]      DATE OF BIRTH:  1981/09/05  PHYSICIAN:  Theodoro Kos, DO       VISIT DATE:  06/30/2014                                  OFFICE VISIT   The patient is a 33 year old male who is here for followup on his abdominal ulcer.  He is doing extremely well with marked improvement in his size and depth.  There is a very small ulcer now, though the majority of it is healed.  He is alert, oriented, and cooperative.  He talked more today than he had in a while.  He is doing well.  Pupils are equal.  His breathing is unlabored.  His heart rate is regular.  The periwound area is markedly improved.  There is no sign of infection.  Recommendation is to continue with the collagen and see him back in 2-3 weeks, at which time he is probably going to be healed.     Theodoro Kos, DO     CS/MEDQ  D:  06/30/2014  T:  07/01/2014  Job:  [id number]

## 2014-07-21 ENCOUNTER — Encounter (HOSPITAL_BASED_OUTPATIENT_CLINIC_OR_DEPARTMENT_OTHER): Payer: PRIVATE HEALTH INSURANCE | Attending: Plastic Surgery

## 2014-07-21 DIAGNOSIS — L89152 Pressure ulcer of sacral region, stage 2: Secondary | ICD-10-CM | POA: Diagnosis present

## 2014-07-21 DIAGNOSIS — L98499 Non-pressure chronic ulcer of skin of other sites with unspecified severity: Secondary | ICD-10-CM | POA: Diagnosis not present

## 2014-07-21 NOTE — Progress Notes (Signed)
[facility]  NAME:  [name], [name] NO.:  [id number]  MEDICAL RECORD NO.:  [id number]      DATE OF BIRTH:  1981-01-17  PHYSICIAN:  Theodoro Kos, DO       VISIT DATE:  07/21/2014                                  OFFICE VISIT   HISTORY:  The patient is a 33 year old male, who is here with his mom for followup on his sacral ulcer as well as his abdominal ulcer.  He has been using Endoform on the abdominal ulcer.  His backside fluctuates between improvement and worsening depending on the amount of time he spends on it.  REVIEW OF SYSTEMS:  Otherwise negative.  No change in medications or social history.  PHYSICAL EXAMINATION:  He is sleepy, but oriented, alert, not in any distress.  Most of the history is given by his mom.  The sacral area shows stage II ulcer with sort of an abrasion on the skin, but no sign of infection.  His abdomen is full, but not tender.  The wound is improving, although slowly.  No signs of infection.  RECOMMENDATION:  For continuing Endoform on the abdominal area, zinc oxide on the sacral area offloading.  Continue with your protein, multivitamin, and follow up in 2 weeks.     Theodoro Kos, DO     CS/MEDQ  D:  07/21/2014  T:  07/21/2014  Job:  [id number]

## 2014-08-04 DIAGNOSIS — L98499 Non-pressure chronic ulcer of skin of other sites with unspecified severity: Secondary | ICD-10-CM | POA: Diagnosis not present

## 2014-08-04 DIAGNOSIS — L89152 Pressure ulcer of sacral region, stage 2: Secondary | ICD-10-CM | POA: Diagnosis not present

## 2014-08-04 NOTE — Progress Notes (Signed)
[facility]  NAME:  MILBERT, BIXLER NO.:  [id number]  MEDICAL RECORD NO.:  [id number]      DATE OF BIRTH:  [birth year]  PHYSICIAN:  Theodoro Kos, DO       VISIT DATE:  08/04/2014                                  OFFICE VISIT   The patient is a 33 year old male who is here for followup on his abdominal ulcer, chronic and sacral ulcer, chronic stage II.  He has completely healed his abdominal ulcer that is also evident and doing extremely well.  Unfortunately, he still has the sacral ulcer and it is little bit deeper than it had been previously.  There was no change in his medications or social history.  On exam, he is alert, oriented, cooperative, not in any distress.  His pupils are equal.  His breathing is unlabored.  His heart rate is regular.  The abdomen is epithelialized and healed over.  The sacral area is little bit deeper than it had been.  It does not look like it is infected.  Recommendation is offloading, continue with the multivitamin, vitamin C, zinc, protein intake, and collagen with Santyl, and we will see him back in a month.  I tried to stress the importance of offloading.     Theodoro Kos, DO     CS/MEDQ  D:  08/04/2014  T:  08/04/2014  Job:  [id number]

## 2014-08-25 ENCOUNTER — Encounter (HOSPITAL_BASED_OUTPATIENT_CLINIC_OR_DEPARTMENT_OTHER): Payer: PRIVATE HEALTH INSURANCE | Attending: Plastic Surgery

## 2014-08-25 DIAGNOSIS — L89152 Pressure ulcer of sacral region, stage 2: Secondary | ICD-10-CM | POA: Insufficient documentation

## 2014-09-01 DIAGNOSIS — L89152 Pressure ulcer of sacral region, stage 2: Secondary | ICD-10-CM | POA: Diagnosis not present

## 2014-10-25 ENCOUNTER — Encounter (HOSPITAL_COMMUNITY): Payer: Self-pay | Admitting: Emergency Medicine

## 2014-10-25 ENCOUNTER — Inpatient Hospital Stay (HOSPITAL_COMMUNITY)
Admission: EM | Admit: 2014-10-25 | Discharge: 2014-10-30 | DRG: 871 | Disposition: A | Discharge status: Home / Self Care | Payer: Medicare Other | Attending: Internal Medicine | Admitting: Internal Medicine

## 2014-10-25 ENCOUNTER — Emergency Department (HOSPITAL_COMMUNITY): Payer: Medicare Other

## 2014-10-25 DIAGNOSIS — A419 Sepsis, unspecified organism: Principal | ICD-10-CM | POA: Diagnosis present

## 2014-10-25 DIAGNOSIS — T8509XA Other mechanical complication of ventricular intracranial (communicating) shunt, initial encounter: Secondary | ICD-10-CM | POA: Diagnosis present

## 2014-10-25 DIAGNOSIS — K592 Neurogenic bowel, not elsewhere classified: Secondary | ICD-10-CM | POA: Diagnosis present

## 2014-10-25 DIAGNOSIS — E119 Type 2 diabetes mellitus without complications: Secondary | ICD-10-CM | POA: Diagnosis present

## 2014-10-25 DIAGNOSIS — Z91040 Latex allergy status: Secondary | ICD-10-CM | POA: Diagnosis not present

## 2014-10-25 DIAGNOSIS — Z79899 Other long term (current) drug therapy: Secondary | ICD-10-CM | POA: Diagnosis not present

## 2014-10-25 DIAGNOSIS — E875 Hyperkalemia: Secondary | ICD-10-CM | POA: Diagnosis present

## 2014-10-25 DIAGNOSIS — Z9889 Other specified postprocedural states: Secondary | ICD-10-CM | POA: Insufficient documentation

## 2014-10-25 DIAGNOSIS — R6521 Severe sepsis with septic shock: Secondary | ICD-10-CM | POA: Diagnosis present

## 2014-10-25 DIAGNOSIS — G808 Other cerebral palsy: Secondary | ICD-10-CM | POA: Diagnosis present

## 2014-10-25 DIAGNOSIS — N189 Chronic kidney disease, unspecified: Secondary | ICD-10-CM | POA: Diagnosis present

## 2014-10-25 DIAGNOSIS — Z91048 Other nonmedicinal substance allergy status: Secondary | ICD-10-CM | POA: Diagnosis not present

## 2014-10-25 DIAGNOSIS — E43 Unspecified severe protein-calorie malnutrition: Secondary | ICD-10-CM | POA: Diagnosis present

## 2014-10-25 DIAGNOSIS — K219 Gastro-esophageal reflux disease without esophagitis: Secondary | ICD-10-CM | POA: Diagnosis present

## 2014-10-25 DIAGNOSIS — R9431 Abnormal electrocardiogram [ECG] [EKG]: Secondary | ICD-10-CM | POA: Diagnosis present

## 2014-10-25 DIAGNOSIS — N179 Acute kidney failure, unspecified: Secondary | ICD-10-CM | POA: Diagnosis present

## 2014-10-25 DIAGNOSIS — I959 Hypotension, unspecified: Secondary | ICD-10-CM

## 2014-10-25 DIAGNOSIS — IMO0002 Reserved for concepts with insufficient information to code with codable children: Secondary | ICD-10-CM | POA: Insufficient documentation

## 2014-10-25 DIAGNOSIS — Z881 Allergy status to other antibiotic agents status: Secondary | ICD-10-CM

## 2014-10-25 DIAGNOSIS — E876 Hypokalemia: Secondary | ICD-10-CM | POA: Diagnosis present

## 2014-10-25 DIAGNOSIS — N390 Urinary tract infection, site not specified: Secondary | ICD-10-CM | POA: Diagnosis present

## 2014-10-25 DIAGNOSIS — G9389 Other specified disorders of brain: Secondary | ICD-10-CM | POA: Diagnosis present

## 2014-10-25 DIAGNOSIS — Z87440 Personal history of urinary (tract) infections: Secondary | ICD-10-CM

## 2014-10-25 DIAGNOSIS — L89313 Pressure ulcer of right buttock, stage 3: Secondary | ICD-10-CM | POA: Diagnosis present

## 2014-10-25 DIAGNOSIS — R6520 Severe sepsis without septic shock: Secondary | ICD-10-CM

## 2014-10-25 DIAGNOSIS — Z79891 Long term (current) use of opiate analgesic: Secondary | ICD-10-CM

## 2014-10-25 DIAGNOSIS — E871 Hypo-osmolality and hyponatremia: Secondary | ICD-10-CM | POA: Diagnosis present

## 2014-10-25 DIAGNOSIS — Q0702 Arnold-Chiari syndrome with hydrocephalus: Secondary | ICD-10-CM

## 2014-10-25 DIAGNOSIS — Q753 Macrocephaly: Secondary | ICD-10-CM | POA: Diagnosis not present

## 2014-10-25 DIAGNOSIS — Z993 Dependence on wheelchair: Secondary | ICD-10-CM | POA: Diagnosis not present

## 2014-10-25 DIAGNOSIS — I129 Hypertensive chronic kidney disease with stage 1 through stage 4 chronic kidney disease, or unspecified chronic kidney disease: Secondary | ICD-10-CM | POA: Diagnosis present

## 2014-10-25 DIAGNOSIS — K529 Noninfective gastroenteritis and colitis, unspecified: Secondary | ICD-10-CM | POA: Diagnosis present

## 2014-10-25 DIAGNOSIS — D649 Anemia, unspecified: Secondary | ICD-10-CM | POA: Diagnosis present

## 2014-10-25 DIAGNOSIS — E860 Dehydration: Secondary | ICD-10-CM | POA: Diagnosis present

## 2014-10-25 DIAGNOSIS — E872 Acidosis: Secondary | ICD-10-CM | POA: Diagnosis present

## 2014-10-25 DIAGNOSIS — Q059 Spina bifida, unspecified: Secondary | ICD-10-CM

## 2014-10-25 DIAGNOSIS — N319 Neuromuscular dysfunction of bladder, unspecified: Secondary | ICD-10-CM | POA: Diagnosis present

## 2014-10-25 DIAGNOSIS — B9620 Unspecified Escherichia coli [E. coli] as the cause of diseases classified elsewhere: Secondary | ICD-10-CM | POA: Diagnosis present

## 2014-10-25 DIAGNOSIS — Z932 Ileostomy status: Secondary | ICD-10-CM | POA: Diagnosis not present

## 2014-10-25 DIAGNOSIS — G9340 Encephalopathy, unspecified: Secondary | ICD-10-CM | POA: Diagnosis present

## 2014-10-25 LAB — COMPREHENSIVE METABOLIC PANEL
ALBUMIN: 4.7 g/dL (ref 3.5–5.2)
ALT: 41 U/L (ref 0–53)
AST: 49 U/L — ABNORMAL HIGH (ref 0–37)
Alkaline Phosphatase: 129 U/L — ABNORMAL HIGH (ref 39–117)
Anion gap: 33 — ABNORMAL HIGH (ref 5–15)
BUN: 140 mg/dL — ABNORMAL HIGH (ref 6–23)
CALCIUM: 9.8 mg/dL (ref 8.4–10.5)
CHLORIDE: 76 mmol/L — AB (ref 96–112)
CO2: 11 mmol/L — ABNORMAL LOW (ref 19–32)
Creatinine, Ser: 7.41 mg/dL — ABNORMAL HIGH (ref 0.50–1.35)
GFR calc non Af Amer: 9 mL/min — ABNORMAL LOW (ref >90)
GFR, EST AFRICAN AMERICAN: 10 mL/min — AB (ref >90)
GLUCOSE: 125 mg/dL — AB (ref 70–99)
Potassium: 6 mmol/L — ABNORMAL HIGH (ref 3.5–5.1)
SODIUM: 120 mmol/L — AB (ref 135–145)
Total Bilirubin: 1.4 mg/dL — ABNORMAL HIGH (ref 0.3–1.2)
Total Protein: 10.2 g/dL — ABNORMAL HIGH (ref 6.0–8.3)

## 2014-10-25 LAB — I-STAT CG4 LACTIC ACID, ED
Lactic Acid, Venous: 4.55 mmol/L (ref 0.5–2.0)
Lactic Acid, Venous: 3.74 mmol/L (ref 0.5–2.0)

## 2014-10-25 LAB — CBC WITH DIFFERENTIAL/PLATELET
BASOS ABS: 0 10*3/uL (ref 0.0–0.1)
Basophils Relative: 0 % (ref 0–1)
EOS PCT: 0 % (ref 0–5)
Eosinophils Absolute: 0 10*3/uL (ref 0.0–0.7)
HEMATOCRIT: 43.8 % (ref 39.0–52.0)
HEMOGLOBIN: 15.7 g/dL (ref 13.0–17.0)
Lymphocytes Relative: 4 % — ABNORMAL LOW (ref 12–46)
Lymphs Abs: 1.1 10*3/uL (ref 0.7–4.0)
MCH: 26.6 pg (ref 26.0–34.0)
MCHC: 35.8 g/dL (ref 30.0–36.0)
MCV: 74.2 fL — AB (ref 78.0–100.0)
Monocytes Absolute: 1.1 10*3/uL — ABNORMAL HIGH (ref 0.1–1.0)
Monocytes Relative: 4 % (ref 3–12)
NEUTROS ABS: 25.4 10*3/uL — AB (ref 1.7–7.7)
NEUTROS PCT: 92 % — AB (ref 43–77)
PLATELETS: 333 10*3/uL (ref 150–400)
RBC: 5.9 MIL/uL — ABNORMAL HIGH (ref 4.22–5.81)
RDW: 13.2 % (ref 11.5–15.5)
WBC: 27.6 10*3/uL — ABNORMAL HIGH (ref 4.0–10.5)

## 2014-10-25 LAB — URINALYSIS, ROUTINE W REFLEX MICROSCOPIC
Bilirubin Urine: NEGATIVE
GLUCOSE, UA: NEGATIVE mg/dL
KETONES UR: NEGATIVE mg/dL
NITRITE: NEGATIVE
PROTEIN: 100 mg/dL — AB
Specific Gravity, Urine: 1.019 (ref 1.005–1.030)
Urobilinogen, UA: 0.2 mg/dL (ref 0.0–1.0)
pH: 5 (ref 5.0–8.0)

## 2014-10-25 LAB — URINE MICROSCOPIC-ADD ON

## 2014-10-25 MED ORDER — LINEZOLID 2 MG/ML IV SOLN
600.0000 mg | Freq: Two times a day (BID) | INTRAVENOUS | Status: DC
  Administered 2014-10-25 – 2014-10-27 (×4): 600 mg via INTRAVENOUS
  Filled 2014-10-25 (×6): qty 300

## 2014-10-25 MED ORDER — SODIUM CHLORIDE 0.9 % IV BOLUS (SEPSIS)
2000.0000 mL | Freq: Once | INTRAVENOUS | Status: AC
  Administered 2014-10-25: 2000 mL via INTRAVENOUS

## 2014-10-25 MED ORDER — SODIUM POLYSTYRENE SULFONATE 15 GM/60ML PO SUSP
30.0000 g | Freq: Once | ORAL | Status: AC
  Administered 2014-10-25: 30 g via ORAL
  Filled 2014-10-25: qty 120

## 2014-10-25 MED ORDER — FENTANYL CITRATE 0.05 MG/ML IJ SOLN
50.0000 ug | Freq: Once | INTRAMUSCULAR | Status: AC
  Administered 2014-10-25: 50 ug via INTRAVENOUS
  Filled 2014-10-25: qty 2

## 2014-10-25 MED ORDER — DEXTROSE 5 % IV SOLN
2.0000 g | Freq: Once | INTRAVENOUS | Status: AC
  Administered 2014-10-26: 2 g via INTRAVENOUS
  Filled 2014-10-25: qty 2

## 2014-10-25 MED ORDER — PROMETHAZINE HCL 25 MG/ML IJ SOLN
25.0000 mg | Freq: Once | INTRAMUSCULAR | Status: AC
  Administered 2014-10-25: 25 mg via INTRAVENOUS
  Filled 2014-10-25: qty 1

## 2014-10-25 MED ORDER — SODIUM BICARBONATE 8.4 % IV SOLN
50.0000 meq | Freq: Once | INTRAVENOUS | Status: AC
  Administered 2014-10-25: 50 meq via INTRAVENOUS
  Filled 2014-10-25: qty 50

## 2014-10-25 MED ORDER — SODIUM CHLORIDE 0.9 % IV BOLUS (SEPSIS)
1000.0000 mL | Freq: Once | INTRAVENOUS | Status: AC
  Administered 2014-10-25: 1000 mL via INTRAVENOUS

## 2014-10-25 MED ORDER — SODIUM CHLORIDE 0.9 % IV SOLN
1.0000 g | Freq: Once | INTRAVENOUS | Status: AC
  Administered 2014-10-25: 1 g via INTRAVENOUS
  Filled 2014-10-25: qty 10

## 2014-10-25 MED ORDER — PIPERACILLIN-TAZOBACTAM 3.375 G IVPB 30 MIN
3.3750 g | Freq: Once | INTRAVENOUS | Status: AC
  Administered 2014-10-25: 3.375 g via INTRAVENOUS
  Filled 2014-10-25: qty 50

## 2014-10-25 NOTE — ED Notes (Signed)
Bed: WA07 Expected date:  Expected time:  Means of arrival:  Comments: EMS headache w/ shunt

## 2014-10-25 NOTE — ED Notes (Signed)
MD at bedside. Dr. Darl Householder at bedside.

## 2014-10-25 NOTE — ED Notes (Signed)
EKG given to [place] for review

## 2014-10-25 NOTE — ED Notes (Signed)
Lactic Acid= 3.74, MD Darl Householder notified

## 2014-10-25 NOTE — ED Notes (Signed)
MD at bedside. Dr. Yao at bedside.  

## 2014-10-25 NOTE — ED Notes (Signed)
Patient transported to X-ray 

## 2014-10-25 NOTE — ED Notes (Signed)
Pt c/o headache for one week. Pt has a ventricular shunt. Has taken 2 oxycodone tablets today with little relief. Pt c/o nausea. Denies vomiting. A/o x 4.

## 2014-10-25 NOTE — ED Provider Notes (Addendum)
CSN: [id number]     Arrival date & time 10/25/14  1928 History   First MD Initiated Contact with Patient 10/25/14 1935     Chief Complaint  Patient presents with  . Headache     (Consider location/radiation/quality/duration/timing/severity/associated sxs/prior Treatment) The history is provided by the patient.  [name] is a 33 y.o. male hx of HTN, GERD, spinal bifida with VP shunt here with headaches, dehydration, diarrhea. Mother states that he stomach bug a week ago and had some nausea and loose stools. He also has intermittent headaches for the last week. Headache is diffuse. Denies any neck pain or fevers. Took Tylenol and oxycodone with minimal relief. Also associated with some photophobia. At baseline he is wheelchair bound and doesn't walk. Denies worsening weakness. He has a ileostomy and has urinary incontinence so wears a diaper.    Past Medical History  Diagnosis Date  . Hypertension   . GERD (gastroesophageal reflux disease)   . Non-healing surgical wound     ABDOMINE  . 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 Arnold-Chiari malformation   . Neurogenic bladder     INCONTINENT -  . 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 Ileostomy in place   . Wheelchair bound     CAN TRASFER SELF WITHOUT BOARD  . Urinary incontinence with continuous leakage   . Wears glasses   . Pressure ulcer, buttock   . Borderline diabetes     DIET CONTROLLED  . Heart palpitations     SECONDARY TO VP SHUNT DRAIN  . History of seizure as newborn     NO MEDS SINCE 34 YRS OLD--  NO ISSUES SINCE  . Neurogenic bowel   . At risk for sleep apnea     STOP-BANG = 4   SENT TO PCP 11-01-2013   Past Surgical History  Procedure Laterality Date  . Ventriculoperitoneal shunt  LAST REVISION NOV 2014    DUE TO ABD WOUND--- SHUNT DRAINS TO HEART  . Hip surgery Bilateral AS CHILD    TENDON RELEASE  .  Cholecystectomy N/A 07/06/2013    Procedure: LAPAROSCOPIC CHOLECYSTECTOMY WITH INTRAOPERATIVE CHOLANGIOGRAM;  Surgeon: Madilyn Hook, DO;  Location: [facility];  Service: General;  Laterality: N/A;  . Colon resection N/A 07/08/2013    Procedure: EXPLORATORY LAPAROSCOPY, DIAGNOSTIC LAPAROSCOPY, PARTIAL COLECTOMY, ABDOMINAL WASHOUT, ABDOMINAL WOUND VAC;  Surgeon: Madilyn Hook, DO;  Location: [facility];  Service: General;  Laterality: N/A;  . Spinal fixation surgery w/ implant  AS CHILD    [place] RODS  . 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 Umbilical hernia repair  03-30-2007  . Right colectomy/  appendectomy/  ileostomy  NOV 2014  [facility]  . Incision and drainage of wound N/A 11/04/2013    Procedure: IRRIGATION AND DEBRIDEMENT ABDOMINAL WOUND WITH PLACEMENT OF ACELL/VAC;  Surgeon: Theodoro Kos, DO;  Location: [facility];  Service: Plastics;  Laterality: N/A;   No family history on file. History  Substance Use Topics  . Smoking status: Never Smoker   . Smokeless tobacco: Never Used  . Alcohol Use: No    Review of Systems  Gastrointestinal: Positive for nausea.  Neurological: Positive for headaches.  All other systems reviewed and are negative.     Allergies  Latex; Adhesive; Ciprofloxacin; and Vancomycin  Home Medications   Prior to Admission medications  Medication Sig Start Date End Date Taking? Authorizing Provider  loperamide (IMODIUM A-D) 2 MG tablet Take 2 mg by mouth 4 (four) times daily.    Yes Historical Provider, MD  metoprolol (LOPRESSOR) 50 MG tablet Take 50 mg by mouth 2 (two) times daily.   Yes Historical Provider, MD  Multiple Vitamin (MULTIVITAMIN) tablet Take 1 tablet by mouth daily.   Yes Historical Provider, MD  ondansetron (ZOFRAN) 4 MG tablet Take 4 mg by mouth every 8 (eight) hours as needed for nausea or vomiting.   Yes Historical Provider, MD  oxyCODONE (OXY IR/ROXICODONE) 5 MG immediate release tablet Take 5 mg by mouth  every 6 (six) hours as needed. 1 by mouth every 3 hours as needed for MILD pain, take 2 by mouth every 3 hours as needed for MODERATE pain, 3 by mouth every 3 hours as needed for SEVERE pain 08/07/13  Yes Blanchie Serve, MD  acetaminophen (TYLENOL) 650 MG CR tablet Take 1,300 mg by mouth every 8 (eight) hours as needed for pain.    Historical Provider, MD  cefUROXime (CEFTIN) 500 MG tablet Take 500 mg by mouth 2 (two) times daily with a meal.    Historical Provider, MD  diphenoxylate-atropine (LOMOTIL) 2.5-0.025 MG per tablet Take 2 tablets by mouth 4 (four) times daily.     Historical Provider, MD   BP 92/52 mmHg  Pulse 83  Temp(Src) 97.6 F (36.4 C) (Oral)  Resp 19  SpO2 100% Physical Exam  Constitutional: He is oriented to person, place, and time.  Chronically ill, dehydrated   HENT:  Head: Normocephalic.  MM dry   Eyes: Conjunctivae are normal. Pupils are equal, round, and reactive to light.  Neck: Normal range of motion. Neck supple.  Cardiovascular: Normal rate, regular rhythm and normal heart sounds.   Pulmonary/Chest: Effort normal.  Diminished bilateral bases   Abdominal: Soft. Bowel sounds are normal. He exhibits no distension. There is no tenderness. There is no rebound.  Ileostomy with brown stool.   Musculoskeletal: Normal range of motion. He exhibits no edema or tenderness.  Neurological: He is alert and oriented to person, place, and time. No cranial nerve deficit. Coordination normal.  Skin: Skin is warm and dry.  Psychiatric: He has a normal mood and affect. His behavior is normal. Judgment and thought content normal.  Nursing note and vitals reviewed.   ED Course  Procedures (including critical care time)  CRITICAL CARE Performed by: Darl Householder, Samona Chihuahua   Total critical care time: 30 min   Critical care time was exclusive of separately billable procedures and treating other patients.  Critical care was necessary to treat or prevent imminent or life-threatening  deterioration.  Critical care was time spent personally by me on the following activities: development of treatment plan with patient and/or surrogate as well as nursing, discussions with consultants, evaluation of patient's response to treatment, examination of patient, obtaining history from patient or surrogate, ordering and performing treatments and interventions, ordering and review of laboratory studies, ordering and review of radiographic studies, pulse oximetry and re-evaluation of patient's condition.   Labs Review Labs Reviewed  CBC WITH DIFFERENTIAL/PLATELET - Abnormal; Notable for the following:    WBC 27.6 (*)    RBC 5.90 (*)    MCV 74.2 (*)    Neutrophils Relative % 92 (*)    Lymphocytes Relative 4 (*)    Neutro Abs 25.4 (*)    Monocytes Absolute 1.1 (*)    All other components within normal limits  COMPREHENSIVE METABOLIC PANEL - Abnormal; Notable for the following:    Sodium 120 (*)    Potassium 6.0 (*)    Chloride 76 (*)    CO2 11 (*)    Glucose, Bld 125 (*)    BUN 140 (*)    Creatinine, Ser 7.41 (*)    Total Protein 10.2 (*)    AST 49 (*)    Alkaline Phosphatase 129 (*)    Total Bilirubin 1.4 (*)    GFR calc non Af Amer 9 (*)    GFR calc Af Amer 10 (*)    Anion gap 33 (*)    All other components within normal limits  I-STAT CG4 LACTIC ACID, ED - Abnormal; Notable for the following:    Lactic Acid, Venous 4.55 (*)    All other components within normal limits  URINE CULTURE  CULTURE, BLOOD (ROUTINE X 2)  CULTURE, BLOOD (ROUTINE X 2)  URINALYSIS, ROUTINE W REFLEX MICROSCOPIC  I-STAT CG4 LACTIC ACID, ED    Imaging Review Dg Cervical Spine 2-3 Views  10/25/2014   CLINICAL DATA:  34 year old male VP shunt. Headache. Initial encounter.  EXAM: CERVICAL SPINE - 2-3 VIEW  COMPARISON:  Chest CTA 07/08/2013.  FINDINGS: Large body habitus. Shunt tubing tracks inferiorly in the right neck can't into the chest. There is a blind ending component of tubing suspected.  Upper thoracic spinal rods. No acute osseous abnormality identified.  IMPRESSION: Two shunt catheters course inferiorly from the right scalp, 1 appears blind ending. The second continues into the chest. Large body habitus.   Electronically Signed   By: Genevie Ann M.D.   On: 10/25/2014 20:58   Ct Head Wo Contrast  10/25/2014   CLINICAL DATA:  34 year old male with VP shunt and headache. Initial encounter.  EXAM: CT HEAD WITHOUT CONTRAST  TECHNIQUE: Contiguous axial images were obtained from the base of the skull through the vertex without intravenous contrast.  COMPARISON:  Report of the head CT 02/21/2001 (no images available).  FINDINGS: Visualized paranasal sinuses and mastoids are clear. Right posterior approach shunt catheter with burr hole. Hyperostosis of the calvarium. No acute osseous abnormality identified. Visualized orbit soft tissues are within normal limits.  Right posterior scalp shunt reservoir. Two separate shunt catheters continue inferiorly.  The internal component of the ventriculostomy shunt terminates at the left frontal horn. Dysplastic ventricles. Ventriculomegaly including enlarged temporal horns. Mild periventricular hypodensity compatible with transependymal edema. Effaced basilar cisterns including the cisterna magna. No acute intracranial hemorrhage identified. No extra-axial collection identified. No evidence of cortically based acute infarction identified.  IMPRESSION: 1. Dysplastic ventricles with evidence of acute ventriculomegaly with some transependymal edema. 2. Effaced basilar cisterns including the cisterna magna. 3. Right posterior approach shunt catheter, tip at the left frontal horn.  Study discussed by telephone with Dr. Shirlyn Goltz on 10/25/2014 at 21:01.   Electronically Signed   By: Genevie Ann M.D.   On: 10/25/2014 21:04   Dg Abd Acute W/chest  10/25/2014   CLINICAL DATA:  Ventriculoperitoneal shunt present. Headache for 1 week. Assessing ventriculoperitoneal shunt location   EXAM: ACUTE ABDOMEN SERIES (ABDOMEN 2 VIEW & CHEST 1 VIEW)  COMPARISON:  None.  FINDINGS: PA chest: Ventriculoperitoneal shunt is seen along the right hemithorax. There appears to be coiling of the shunt in the right neck region. Lungs are clear. Heart size is within normal limits. Pulmonary vascularity is normal. There is extensive postoperative change in the thoracic spine with extensive spinal dysraphism present.  Supine and  left lateral decubitus abdomen: The shunt catheter tip is seen on the decubitus examination. Tip is just below the diaphragm in the midline of the right abdomen. The overall bowel gas pattern is unremarkable. No obstruction or free air. Postoperative change in the lumbar spine with spinal dysraphism noted.  IMPRESSION: Shunt catheter is at a level just below the diaphragm in the midline of the abdomen. It is not possible to ascertain that the catheter is in the peritoneum versus in the soft tissues overlying the intra-abdominal contents. Bowel gas pattern unremarkable. Postoperative change in the spine region. Note that there is swelling of a portion of the shunt catheter in the neck region on the right. No lung edema or consolidation.   Electronically Signed   By: Lowella Grip III M.D.   On: 10/25/2014 21:11     EKG Interpretation   Date/Time:  Saturday October 25 2014 22:14:13 EST Ventricular Rate:  93 PR Interval:  145 QRS Duration: 91 QT Interval:  444 QTC Calculation: 552 R Axis:   81 Text Interpretation:  Sinus rhythm Ventricular premature complex Prolonged  QT interval prolonged QT new Confirmed by Maximo Spratling  MD, Anasophia Pecor ([id number]) on  10/25/2014 10:32:38 PM      MDM   Final diagnoses:  Hypotension  Septic shock  UTI (lower urinary tract infection)  Renal failure (ARF), acute on chronic  Hyperkalemia    [name] is a 34 y.o. male here with headache, dehydration. Also noted to be hypotensive. Will do sepsis workup. I doubt meningitis. Will do shunt series  as well. Will hydrate and likely need admission.   9 pm Shunt series done. Radiology called saying that there is ventriculomegaly with edema. I talked with Dr. Arlan Organ, neurosurgeon at [facility]. He feels that given patient septic, should treat the sepsis as they are hesitant to operate on him in the setting of sepsis.   10 PM WBC 27, lactate 4.5. Has acute renal failure with Cr 7 with hyperkalemia of 6 and bicarb 12. He has new prolonged QT on EKG. Given kayexelate, bicarb, calcium. Still hypotesive. Level 1 sepsis initiated. Given 30 cc/kg bolus. Ordered zosyn. Patient allergic to vanc.   10:33 PM Discussed with Dr. Ronnald Ramp from neurosurgery, who is hesitant to tap the shunt or operate on patient in the setting of sepsis. He will consult on patient. I called Dr. Joya Gaskins from ICU. He will admit at [facility]. His headache improved but now more sleepy. Will admit to ICU.      Wandra Arthurs, MD 10/25/14 [id number]  Wandra Arthurs, MD 10/25/14 [phone]

## 2014-10-26 ENCOUNTER — Encounter (HOSPITAL_COMMUNITY): Payer: Self-pay | Admitting: Neurological Surgery

## 2014-10-26 DIAGNOSIS — N179 Acute kidney failure, unspecified: Secondary | ICD-10-CM

## 2014-10-26 DIAGNOSIS — N189 Chronic kidney disease, unspecified: Secondary | ICD-10-CM

## 2014-10-26 DIAGNOSIS — E43 Unspecified severe protein-calorie malnutrition: Secondary | ICD-10-CM

## 2014-10-26 DIAGNOSIS — A419 Sepsis, unspecified organism: Secondary | ICD-10-CM

## 2014-10-26 DIAGNOSIS — T8509XA Other mechanical complication of ventricular intracranial (communicating) shunt, initial encounter: Secondary | ICD-10-CM

## 2014-10-26 DIAGNOSIS — R6521 Severe sepsis with septic shock: Secondary | ICD-10-CM

## 2014-10-26 DIAGNOSIS — R6520 Severe sepsis without septic shock: Secondary | ICD-10-CM

## 2014-10-26 DIAGNOSIS — Q059 Spina bifida, unspecified: Secondary | ICD-10-CM

## 2014-10-26 DIAGNOSIS — N390 Urinary tract infection, site not specified: Secondary | ICD-10-CM | POA: Insufficient documentation

## 2014-10-26 LAB — POCT I-STAT 3, VENOUS BLOOD GAS (G3P V)
Acid-base deficit: 6 mmol/L — ABNORMAL HIGH (ref 0.0–2.0)
Bicarbonate: 20.1 mEq/L (ref 20.0–24.0)
O2 SAT: 48 %
Patient temperature: 97.3
TCO2: 21 mmol/L (ref 0–100)
pCO2, Ven: 38.3 mmHg — ABNORMAL LOW (ref 45.0–50.0)
pH, Ven: 7.325 — ABNORMAL HIGH (ref 7.250–7.300)
pO2, Ven: 27 mmHg — CL (ref 30.0–45.0)

## 2014-10-26 LAB — BASIC METABOLIC PANEL
Anion gap: 16 — ABNORMAL HIGH (ref 5–15)
Anion gap: 12 (ref 5–15)
BUN: 105 mg/dL — AB (ref 6–23)
BUN: 67 mg/dL — ABNORMAL HIGH (ref 6–23)
CALCIUM: 8 mg/dL — AB (ref 8.4–10.5)
CHLORIDE: 108 mmol/L (ref 96–112)
CO2: 20 mmol/L (ref 19–32)
CO2: 20 mmol/L (ref 19–32)
CREATININE: 4.76 mg/dL — AB (ref 0.50–1.35)
Calcium: 8 mg/dL — ABNORMAL LOW (ref 8.4–10.5)
Chloride: 97 mmol/L (ref 96–112)
Creatinine, Ser: 1.75 mg/dL — ABNORMAL HIGH (ref 0.50–1.35)
GFR calc non Af Amer: 15 mL/min — ABNORMAL LOW (ref >90)
GFR calc non Af Amer: 50 mL/min — ABNORMAL LOW (ref >90)
GFR, EST AFRICAN AMERICAN: 17 mL/min — AB (ref >90)
GFR, EST AFRICAN AMERICAN: 58 mL/min — AB (ref >90)
GLUCOSE: 117 mg/dL — AB (ref 70–99)
Glucose, Bld: 109 mg/dL — ABNORMAL HIGH (ref 70–99)
Potassium: 3 mmol/L — ABNORMAL LOW (ref 3.5–5.1)
Potassium: 2.6 mmol/L — CL (ref 3.5–5.1)
Sodium: 133 mmol/L — ABNORMAL LOW (ref 135–145)
Sodium: 140 mmol/L (ref 135–145)

## 2014-10-26 LAB — FECAL LACTOFERRIN, QUANT: Fecal Lactoferrin: NEGATIVE

## 2014-10-26 LAB — GLUCOSE, CAPILLARY
GLUCOSE-CAPILLARY: 116 mg/dL — AB (ref 70–99)
Glucose-Capillary: 115 mg/dL — ABNORMAL HIGH (ref 70–99)
Glucose-Capillary: 89 mg/dL (ref 70–99)
Glucose-Capillary: 91 mg/dL (ref 70–99)
Glucose-Capillary: 97 mg/dL (ref 70–99)
Glucose-Capillary: 131 mg/dL — ABNORMAL HIGH (ref 70–99)

## 2014-10-26 LAB — CBC
HCT: 31.3 % — ABNORMAL LOW (ref 39.0–52.0)
HEMOGLOBIN: 10.9 g/dL — AB (ref 13.0–17.0)
MCH: 25.6 pg — ABNORMAL LOW (ref 26.0–34.0)
MCHC: 34.8 g/dL (ref 30.0–36.0)
MCV: 73.6 fL — ABNORMAL LOW (ref 78.0–100.0)
PLATELETS: 235 10*3/uL (ref 150–400)
RBC: 4.25 MIL/uL (ref 4.22–5.81)
RDW: 13.2 % (ref 11.5–15.5)
WBC: 13.3 10*3/uL — AB (ref 4.0–10.5)

## 2014-10-26 LAB — CLOSTRIDIUM DIFFICILE BY PCR: CDIFFPCR: NEGATIVE

## 2014-10-26 LAB — MRSA PCR SCREENING: MRSA by PCR: NEGATIVE

## 2014-10-26 LAB — PROCALCITONIN: PROCALCITONIN: 1.38 ng/mL

## 2014-10-26 MED ORDER — POTASSIUM CHLORIDE 10 MEQ/50ML IV SOLN: 10.0000 meq | INTRAVENOUS | Status: DC

## 2014-10-26 MED ORDER — SODIUM CHLORIDE 0.9 % IV SOLN: INTRAVENOUS | Status: AC

## 2014-10-26 MED ORDER — SODIUM CHLORIDE 0.9 % IV BOLUS (SEPSIS)
1000.0000 mL | Freq: Once | INTRAVENOUS | Status: AC
  Administered 2014-10-26: 1000 mL via INTRAVENOUS

## 2014-10-26 MED ORDER — INSULIN ASPART 100 UNIT/ML ~~LOC~~ SOLN
2.0000 [IU] | SUBCUTANEOUS | Status: DC
  Administered 2014-10-26: 2 [IU] via SUBCUTANEOUS

## 2014-10-26 MED ORDER — SODIUM BICARBONATE 8.4 % IV SOLN
50.0000 meq | Freq: Once | INTRAVENOUS | Status: AC
  Administered 2014-10-26: 50 meq via INTRAVENOUS
  Filled 2014-10-26: qty 50

## 2014-10-26 MED ORDER — POTASSIUM CHLORIDE CRYS ER 20 MEQ PO TBCR
40.0000 meq | EXTENDED_RELEASE_TABLET | Freq: Once | ORAL | Status: AC
  Administered 2014-10-26: 40 meq via ORAL
  Filled 2014-10-26: qty 2

## 2014-10-26 MED ORDER — HEPARIN SODIUM (PORCINE) 5000 UNIT/ML IJ SOLN
5000.0000 [IU] | Freq: Three times a day (TID) | INTRAMUSCULAR | Status: DC
  Administered 2014-10-26 – 2014-10-29 (×11): 5000 [IU] via SUBCUTANEOUS
  Filled 2014-10-26 (×18): qty 1

## 2014-10-26 MED ORDER — DEXTROSE 5 % IV SOLN
1.0000 g | INTRAVENOUS | Status: DC
  Administered 2014-10-26: 1 g via INTRAVENOUS
  Filled 2014-10-26: qty 1

## 2014-10-26 MED ORDER — SODIUM CHLORIDE 0.9 % IV SOLN
INTRAVENOUS | Status: DC
  Administered 2014-10-26 – 2014-10-30 (×7): via INTRAVENOUS

## 2014-10-26 MED ORDER — PANTOPRAZOLE SODIUM 40 MG IV SOLR
40.0000 mg | Freq: Every day | INTRAVENOUS | Status: DC
  Administered 2014-10-26: 40 mg via INTRAVENOUS
  Filled 2014-10-26 (×2): qty 40

## 2014-10-26 MED ORDER — MORPHINE SULFATE 4 MG/ML IJ SOLN: 4.0000 mg | INTRAMUSCULAR | Status: DC | PRN

## 2014-10-26 MED ORDER — ONDANSETRON HCL 4 MG/2ML IJ SOLN
4.0000 mg | Freq: Four times a day (QID) | INTRAMUSCULAR | Status: DC | PRN
  Administered 2014-10-26 – 2014-10-29 (×2): 4 mg via INTRAVENOUS
  Filled 2014-10-26 (×2): qty 2

## 2014-10-26 MED ORDER — ACETAMINOPHEN 325 MG PO TABS
650.0000 mg | ORAL_TABLET | Freq: Four times a day (QID) | ORAL | Status: DC | PRN
  Administered 2014-10-26 – 2014-10-29 (×5): 650 mg via ORAL
  Filled 2014-10-26 (×6): qty 2

## 2014-10-26 MED ORDER — SODIUM CHLORIDE 0.9 % IV SOLN: 250.0000 mL | INTRAVENOUS | Status: DC | PRN

## 2014-10-26 MED ORDER — MORPHINE SULFATE 2 MG/ML IJ SOLN
1.0000 mg | INTRAMUSCULAR | Status: DC | PRN
  Administered 2014-10-26: 2 mg via INTRAVENOUS
  Filled 2014-10-26 (×2): qty 1

## 2014-10-26 NOTE — Progress Notes (Signed)
PULMONARY / CRITICAL CARE MEDICINE HISTORY AND PHYSICAL EXAMINATION   Name: [name] MRN: [id number] DOB: 08/02/1981    ADMISSION DATE:  10/25/2014  PRIMARY SERVICE: PCCM  CHIEF COMPLAINT:  Confusion, abdominal pain, headache  BRIEF PATIENT DESCRIPTION: 34 y/o man w/ Spina Bifida, paraplegia, complex surgical history presenting with fevers, renal failure, and hydrocephelaous.   SIGNIFICANT EVENTS / STUDIES:  CT head / shunt series 2/14>> acute ventriculomegaly  LINES / TUBES: PIV Ostomy Condom-cath foley  CULTURES: Blood (x2) - 2/13 Urine - 2/13  ANTIBIOTICS: Cefepime 2/24 >> Linezolid 2/14 >> Pip-tazo (x1 dose)  HISTORY OF PRESENT ILLNESS:   Mr. [name] is a 34 y/o man w/ Spina Bifida, paraplegia, complex surgical history following a chole with a bowel perforation in October 8416 complicated by ARDS presenting with metabolic disarray and altered mental status. He has had frequent UTIs in the past who presented to [facility] in October of 2014 and had a cholecystectomy which was complicated by perforation of bowel requiring multiple surgical revisions/washouts done at [facility]. He does also have a history of hydrocephalous with VP shunt. When he had his complex abdominal surgical situation, he did have his drain externalized, and his mother reports that the catheter was ultimately placed in his chest, in what sounds like is his pleural space. Ten days prior to admission, the patient had a "gi bug" that his mother had as well, with dehydration, nausea, and poor PO intake. The patient had persistent watery output from his ostomy, which is usual for him. A few days prior to admission, he became more confused, and was "not himself" on the day of admission, so family presented to [facility] ED. Given need for NSGY consult, he was admitted to [facility] to PCCM.   SUBJECTIVE: c/o headache Afebrile Soft BP   VITAL SIGNS: Temp:  [97.3 F (36.3 C)-98.7 F (37.1 C)] 98.7 F (37.1 C) (02/14  0739) Pulse Rate:  [68-94] 68 (02/14 1000) Resp:  [11-30] 13 (02/14 1000) BP: (67-132)/(46-99) 94/52 mmHg (02/14 1000) SpO2:  [98 %-100 %] 100 % (02/14 1000) Weight:  [73.1 kg (161 lb 2.5 oz)] 73.1 kg (161 lb 2.5 oz) (02/14 0140) HEMODYNAMICS:   VENTILATOR SETTINGS:   INTAKE / OUTPUT: Intake/Output      02/13 0701 - 02/14 0700 02/14 0701 - 02/15 0700   I.V. (mL/kg) 1454.2 (19.9) 375 (5.1)   IV Piggyback 50    Total Intake(mL/kg) 1504.2 (20.6) 375 (5.1)   Urine (mL/kg/hr) 350    Stool 1000    Total Output 1350     Net +154.2 +375        Urine Occurrence 1 x      PHYSICAL EXAMINATION: General:  Obese man, lying in bed, NAD Neuro:  awake, conversant. HEENT:  Dry MM Cardiovascular:  RRR, normal s1/s2 Lungs:  CTAB Abdomen:  Large, broad surgical scar noted. Fully healed. Large amount of watery discharge from RLQ colostomy Musculoskeletal:  Atrophic lower hemibody  Skin:  intact  LABS:  CBC  Recent Labs Lab 10/25/14 2004 10/26/14 0326  WBC 27.6* 13.3*  HGB 15.7 10.9*  HCT 43.8 31.3*  PLT 333 235   Coag's No results for input(s): APTT, INR in the last 168 hours. BMET  Recent Labs Lab 10/25/14 2004 10/26/14 0326  NA 120* 133*  K 6.0* 3.0*  CL 76* 97  CO2 11* 20  BUN 140* 105*  CREATININE 7.41* 4.76*  GLUCOSE 125* 109*   Electrolytes  Recent Labs Lab 10/25/14 2004 10/26/14 6063  CALCIUM 9.8 8.0*   Sepsis Markers  Recent Labs Lab 10/25/14 2052 10/25/14 2314 10/26/14 0326 10/26/14 0327  LATICACIDVEN 4.55* 3.74*  --  3.0*  PROCALCITON  --   --  1.38  --    ABG No results for input(s): PHART, PCO2ART, PO2ART in the last 168 hours. Liver Enzymes  Recent Labs Lab 10/25/14 2004  AST 49*  ALT 41  ALKPHOS 129*  BILITOT 1.4*  ALBUMIN 4.7   Cardiac Enzymes No results for input(s): TROPONINI, PROBNP in the last 168 hours. Glucose  Recent Labs Lab 10/26/14 0115 10/26/14 0433 10/26/14 0704  GLUCAP 116* 115* 89    Imaging Dg Cervical  Spine 2-3 Views  10/25/2014   CLINICAL DATA:  34 year old male VP shunt. Headache. Initial encounter.  EXAM: CERVICAL SPINE - 2-3 VIEW  COMPARISON:  Chest CTA 07/08/2013.  FINDINGS: Large body habitus. Shunt tubing tracks inferiorly in the right neck can't into the chest. There is a blind ending component of tubing suspected. Upper thoracic spinal rods. No acute osseous abnormality identified.  IMPRESSION: Two shunt catheters course inferiorly from the right scalp, 1 appears blind ending. The second continues into the chest. Large body habitus.   Electronically Signed   By: Genevie Ann M.D.   On: 10/25/2014 20:58   Ct Head Wo Contrast  10/25/2014   CLINICAL DATA:  34 year old male with VP shunt and headache. Initial encounter.  EXAM: CT HEAD WITHOUT CONTRAST  TECHNIQUE: Contiguous axial images were obtained from the base of the skull through the vertex without intravenous contrast.  COMPARISON:  Report of the head CT 02/21/2001 (no images available).  FINDINGS: Visualized paranasal sinuses and mastoids are clear. Right posterior approach shunt catheter with burr hole. Hyperostosis of the calvarium. No acute osseous abnormality identified. Visualized orbit soft tissues are within normal limits.  Right posterior scalp shunt reservoir. Two separate shunt catheters continue inferiorly.  The internal component of the ventriculostomy shunt terminates at the left frontal horn. Dysplastic ventricles. Ventriculomegaly including enlarged temporal horns. Mild periventricular hypodensity compatible with transependymal edema. Effaced basilar cisterns including the cisterna magna. No acute intracranial hemorrhage identified. No extra-axial collection identified. No evidence of cortically based acute infarction identified.  IMPRESSION: 1. Dysplastic ventricles with evidence of acute ventriculomegaly with some transependymal edema. 2. Effaced basilar cisterns including the cisterna magna. 3. Right posterior approach shunt catheter,  tip at the left frontal horn.  Study discussed by telephone with Dr. Shirlyn Goltz on 10/25/2014 at 21:01.   Electronically Signed   By: Genevie Ann M.D.   On: 10/25/2014 21:04   Dg Abd Acute W/chest  10/25/2014   CLINICAL DATA:  Ventriculoperitoneal shunt present. Headache for 1 week. Assessing ventriculoperitoneal shunt location  EXAM: ACUTE ABDOMEN SERIES (ABDOMEN 2 VIEW & CHEST 1 VIEW)  COMPARISON:  None.  FINDINGS: PA chest: Ventriculoperitoneal shunt is seen along the right hemithorax. There appears to be coiling of the shunt in the right neck region. Lungs are clear. Heart size is within normal limits. Pulmonary vascularity is normal. There is extensive postoperative change in the thoracic spine with extensive spinal dysraphism present.  Supine and left lateral decubitus abdomen: The shunt catheter tip is seen on the decubitus examination. Tip is just below the diaphragm in the midline of the right abdomen. The overall bowel gas pattern is unremarkable. No obstruction or free air. Postoperative change in the lumbar spine with spinal dysraphism noted.  IMPRESSION: Shunt catheter is at a level just below the diaphragm in the midline  of the abdomen. It is not possible to ascertain that the catheter is in the peritoneum versus in the soft tissues overlying the intra-abdominal contents. Bowel gas pattern unremarkable. Postoperative change in the spine region. Note that there is swelling of a portion of the shunt catheter in the neck region on the right. No lung edema or consolidation.   Electronically Signed   By: Lowella Grip III M.D.   On: 10/25/2014 21:11    EKG: NSR CXR: none  ASSESSMENT / PLAN:  Active Problems:   Spina bifida   Septic shock   Protein-calorie malnutrition, severe   Obstructed VP shunt   Severe sepsis   Renal failure (ARF), acute on chronic   Severe sepsis with septic shock   PULMONARY A: No issues at this time P:   [place]   CARDIOVASCULAR A: Hypotension P:   Treating  for septic shock  RENAL A: Acute renal failure due to severe dehydration. Hyponatremia Hyperkalemia Gap metabolic acidosis Likely NAGMA P:   Ct IVFs   GASTROINTESTINAL A: Colitis / enteritis c diff neg P:    Consider imaging of abdomen dpending on cultures  HEMATOLOGIC A: Leukocytosis P:   Due to sepsis  INFECTIOUS A: Severe sepsis P:   Treating broadly with empiric cefepime / linezolid ID input  ENDOCRINE A: DM2 P:   Monitor CBG for now  NEUROLOGIC A: Acute encephalopathy hydrocephalous Possible shunt infection P:   NSGY is consulted; treating with ABX -defer need for CSF analysis tot hem    TODAY'S SUMMARY: 35 y/o man with complex history presenting with sepsis and renal failure. Possible shunt infection.  I have personally obtained a history, examined the patient, evaluated laboratory and imaging results, formulated the assessment and plan and placed orders.  CRITICAL CARE: The patient is critically ill with multiple organ systems failure and requires high complexity decision making for assessment and support, frequent evaluation and titration of therapies, application of advanced monitoring technologies and extensive interpretation of multiple databases. Critical Care Time devoted to patient care services described in this note is 35 minutes.      10/26/2014, 11:04 AM

## 2014-10-26 NOTE — Progress Notes (Signed)
[place] Progress Note Patient Name: FIDENCIO DUDDY DOB: 1981-06-30 MRN: [id number]   Date of Service  10/26/2014  HPI/Events of Note  K was 6, now 3.0  eICU Interventions  Hold on Rx of K     Intervention Category Major Interventions: Electrolyte abnormality - evaluation and management  Asencion Noble 10/26/2014, 4:54 AM

## 2014-10-26 NOTE — Progress Notes (Signed)
CRITICAL VALUE ALERT  Critical value received:  K+ 2.6  Date of notification:  10/26/14  Time of notification:  0610  Critical value read back:Yes.    Nurse who received alert:  Nettie Elm, RN  MD notified (1st page):  Stevenson Clinch, MD  Time of first page:  0615  MD notified (2nd page):  Time of second page:  Responding MD:  Stevenson Clinch, MD   Time MD responded:  [phone]

## 2014-10-26 NOTE — Progress Notes (Signed)
UR Completed.  [phone]  

## 2014-10-26 NOTE — Consult Note (Signed)
Reason for Consult:VPS eval Referring Physician: CCM  [name] is an 34 y.o. male.   HPI:  34 year old male with a very complex past history who is admitted with sepsis. He has a history of Chiari II malformation and spina bifida and underwent ventriculoperitoneal shunting. He was admitted in October and they spoke with the neurosurgeon on call here who suggested not externalizing the shunt for a cholecystectomy. The patient then had a bowel leak requiring transfer to [facility] where he had multiple surgeries. His ventriculoperitoneal shunt was externalized and then created a [facility] shunt by Dr Tivis Ringer. He has developed headaches and poor appetite and then fever and was admitted with sepsis yesterday. He has improved overnight to the point that he is now asking for fluids and food and responding better to his mother. He is now speaking some. He tells me his headache is improved though he still does have a headache the response to Tylenol. His CT scan was read out as acute hydrocephalus with mild transependymal absorption and neurosurgical evaluation was requested. A shunt series was also performed.  Past Medical History  Diagnosis Date  . Hypertension   . GERD (gastroesophageal reflux disease)   . Non-healing surgical wound     ABDOMINE  . 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 Arnold-Chiari malformation   . Neurogenic bladder     INCONTINENT -  . 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 Ileostomy in place   . Wheelchair bound     CAN TRASFER SELF WITHOUT BOARD  . Urinary incontinence with continuous leakage   . Wears glasses   . Pressure ulcer, buttock   . Borderline diabetes     DIET CONTROLLED  . Heart palpitations     SECONDARY TO VP SHUNT DRAIN  . History of seizure as newborn     NO MEDS SINCE 34 YRS OLD--  NO ISSUES SINCE  . Neurogenic bowel   . At risk for sleep apnea      STOP-BANG = 4   SENT TO PCP 11-01-2013    Past Surgical History  Procedure Laterality Date  . Ventriculoperitoneal shunt  LAST REVISION NOV 2014    DUE TO ABD WOUND--- SHUNT DRAINS TO HEART  . Hip surgery Bilateral AS CHILD    TENDON RELEASE  . Cholecystectomy N/A 07/06/2013    Procedure: LAPAROSCOPIC CHOLECYSTECTOMY WITH INTRAOPERATIVE CHOLANGIOGRAM;  Surgeon: Madilyn Hook, DO;  Location: [facility];  Service: General;  Laterality: N/A;  . Colon resection N/A 07/08/2013    Procedure: EXPLORATORY LAPAROSCOPY, DIAGNOSTIC LAPAROSCOPY, PARTIAL COLECTOMY, ABDOMINAL WASHOUT, ABDOMINAL WOUND VAC;  Surgeon: Madilyn Hook, DO;  Location: [facility];  Service: General;  Laterality: N/A;  . Spinal fixation surgery w/ implant  AS CHILD    [place] R[facility]  . 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 Umbilical hernia repair  03-30-2007  . Right colectomy/  appendectomy/  ileostomy  NOV 2014  [facility]  . Incision and drainage of wound N/A 11/04/2013    Procedure: IRRIGATION AND DEBRIDEMENT ABDOMINAL WOUND WITH PLACEMENT OF ACELL/VAC;  Surgeon: Theodoro Kos, DO;  Location: [facility];  Service: Plastics;  Laterality: N/A;    Allergies  Allergen Reactions  . Latex Hives  . Adhesive [Tape] Hives  . Ciprofloxacin Other (See Comments)    IV only-- caused burning in arm   .  Vancomycin Itching and Swelling    History  Substance Use Topics  . Smoking status: Never Smoker   . Smokeless tobacco: Never Used  . Alcohol Use: No    History reviewed. No pertinent family history.   Review of Systems  Positive ROS: As above  All other systems have been reviewed and were otherwise negative with the exception of those mentioned in the HPI and as above.  Objective: Vital signs in last 24 hours: Temp:  [97.3 F (36.3 C)-98.7 F (37.1 C)] 98.7 F (37.1 C) (02/14 0739) Pulse Rate:  [68-94] 68 (02/14 1000) Resp:  [11-30] 13 (02/14 1000) BP: (67-132)/(46-99) 94/52 mmHg (02/14  1000) SpO2:  [98 %-100 %] 100 % (02/14 1000) Weight:  [161 lb 2.5 oz (73.1 kg)] 161 lb 2.5 oz (73.1 kg) (02/14 0140)  General Appearance: Lethargic and keeps eyes closed or covered but will follow commands and answer simple questions Head: Macrocephalic, shunt pumps and refills briskly Eyes: PERRL, conjunctiva/corneas clear, EOM's intact      NEUROLOGIC:   Mental status: Easily arousable Motor Exam - gross movement of extremities Sensory Exam - grossly normal Reflexes: Not tested Coordination - not tested Gait - not tested Balance - not tested Cranial Nerves: I: smell Not tested  II: visual acuity  OS: na    OD: na  II: visual fields   II: pupils Equal, round, reactive to light  III,VII: ptosis   III,IV,VI: extraocular muscles    V: mastication   V: facial light touch sensation    V,VII: corneal reflex    VII: facial muscle function - upper    VII: facial muscle function - lower   VIII: hearing   IX: soft palate elevation    IX,X: gag reflex   XI: trapezius strength    XI: sternocleidomastoid strength   XI: neck flexion strength    XII: tongue strength      Data Review Lab Results  Component Value Date   WBC 13.3* 10/26/2014   HGB 10.9* 10/26/2014   HCT 31.3* 10/26/2014   MCV 73.6* 10/26/2014   PLT 235 10/26/2014   Lab Results  Component Value Date   NA 133* 10/26/2014   K 3.0* 10/26/2014   CL 97 10/26/2014   CO2 20 10/26/2014   BUN 105* 10/26/2014   CREATININE 4.76* 10/26/2014   GLUCOSE 109* 10/26/2014   Lab Results  Component Value Date   INR 1.55* 07/08/2013    Radiology: Dg Cervical Spine 2-3 Views  10/25/2014   CLINICAL DATA:  34 year old male VP shunt. Headache. Initial encounter.  EXAM: CERVICAL SPINE - 2-3 VIEW  COMPARISON:  Chest CTA 07/08/2013.  FINDINGS: Large body habitus. Shunt tubing tracks inferiorly in the right neck can't into the chest. There is a blind ending component of tubing suspected. Upper thoracic spinal rods. No acute osseous  abnormality identified.  IMPRESSION: Two shunt catheters course inferiorly from the right scalp, 1 appears blind ending. The second continues into the chest. Large body habitus.   Electronically Signed   By: Genevie Ann M.D.   On: 10/25/2014 20:58   Ct Head Wo Contrast  10/25/2014   CLINICAL DATA:  34 year old male with VP shunt and headache. Initial encounter.  EXAM: CT HEAD WITHOUT CONTRAST  TECHNIQUE: Contiguous axial images were obtained from the base of the skull through the vertex without intravenous contrast.  COMPARISON:  Report of the head CT 02/21/2001 (no images available).  FINDINGS: Visualized paranasal sinuses and mastoids are clear. Right posterior  approach shunt catheter with burr hole. Hyperostosis of the calvarium. No acute osseous abnormality identified. Visualized orbit soft tissues are within normal limits.  Right posterior scalp shunt reservoir. Two separate shunt catheters continue inferiorly.  The internal component of the ventriculostomy shunt terminates at the left frontal horn. Dysplastic ventricles. Ventriculomegaly including enlarged temporal horns. Mild periventricular hypodensity compatible with transependymal edema. Effaced basilar cisterns including the cisterna magna. No acute intracranial hemorrhage identified. No extra-axial collection identified. No evidence of cortically based acute infarction identified.  IMPRESSION: 1. Dysplastic ventricles with evidence of acute ventriculomegaly with some transependymal edema. 2. Effaced basilar cisterns including the cisterna magna. 3. Right posterior approach shunt catheter, tip at the left frontal horn.  Study discussed by telephone with Dr. Shirlyn Goltz on 10/25/2014 at 21:01.   Electronically Signed   By: Genevie Ann M.D.   On: 10/25/2014 21:04   Dg Abd Acute W/chest  10/25/2014   CLINICAL DATA:  Ventriculoperitoneal shunt present. Headache for 1 week. Assessing ventriculoperitoneal shunt location  EXAM: ACUTE ABDOMEN SERIES (ABDOMEN 2 VIEW &  CHEST 1 VIEW)  COMPARISON:  None.  FINDINGS: PA chest: Ventriculoperitoneal shunt is seen along the right hemithorax. There appears to be coiling of the shunt in the right neck region. Lungs are clear. Heart size is within normal limits. Pulmonary vascularity is normal. There is extensive postoperative change in the thoracic spine with extensive spinal dysraphism present.  Supine and left lateral decubitus abdomen: The shunt catheter tip is seen on the decubitus examination. Tip is just below the diaphragm in the midline of the right abdomen. The overall bowel gas pattern is unremarkable. No obstruction or free air. Postoperative change in the lumbar spine with spinal dysraphism noted.  IMPRESSION: Shunt catheter is at a level just below the diaphragm in the midline of the abdomen. It is not possible to ascertain that the catheter is in the peritoneum versus in the soft tissues overlying the intra-abdominal contents. Bowel gas pattern unremarkable. Postoperative change in the spine region. Note that there is swelling of a portion of the shunt catheter in the neck region on the right. No lung edema or consolidation.   Electronically Signed   By: Lowella Grip III M.D.   On: 10/25/2014 21:11     Assessment/Plan: This is a very complex history and a 34 year old with a history of shunting who presented with sepsis. He has certainly improved with treatment. I reviewed his CT scan and I'm not sure that I agree that it represents acute hydrocephalus with transependymal absorption. There is No CT scan to compare to in this facility to know whether or not he has enlarged ventricles or if this ventricular size is baseline for him. His shunt pumps and refills nicely. Since his infection/sepsis can easily explain all of the symptoms and signs for which he presented with, would recommend against shunt tap at this time as this increases the risk of shunt infection. It would be nice to get comparison CT scan from [facility]. I would follow him clinically over time as his sepsis and infection clears and see how he does from a headache standpoint and a function standpoint. I have discussed all of this with his mother and she seems satisfied.   JONES,DAVID S 10/26/2014 11:34 AM

## 2014-10-26 NOTE — Consult Note (Signed)
ANTIBIOTIC CONSULT NOTE - INITIAL  Pharmacy Consult for Cefepime Indication: VP shunt infection/sepsis  Allergies  Allergen Reactions  . Latex Hives  . Adhesive [Tape] Hives  . Ciprofloxacin Other (See Comments)    IV only-- caused burning in arm   . Vancomycin Itching and Swelling    Patient Measurements: Height: 5' 2.99" (160 cm) Weight: 161 lb 2.5 oz (73.1 kg) IBW/kg (Calculated) : 56.88  Vital Signs: Temp: 97.6 F (36.4 C) (02/13 1928) Temp Source: Oral (02/13 1928) BP: 104/47 mmHg (02/14 0015) Pulse Rate: 80 (02/14 0015) Intake/Output from previous day: 02/13 0701 - 02/14 0700 In: 800 [I.V.:800] Out: -  Intake/Output from this shift: Total I/O In: 800 [I.V.:800] Out: -   Labs:  Recent Labs  10/25/14 2004  WBC 27.6*  HGB 15.7  PLT 333  CREATININE 7.41*   Estimated Creatinine Clearance: 13 mL/min (by C-G formula based on Cr of 7.41).  Microbiology: No results found for this or any previous visit (from the past 720 hour(s)).  Medical History: Past Medical History  Diagnosis Date  . Hypertension   . GERD (gastroesophageal reflux disease)   . Non-healing surgical wound     ABDOMINE  . 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 Arnold-Chiari malformation   . Neurogenic bladder     INCONTINENT -  . 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 Ileostomy in place   . Wheelchair bound     CAN TRASFER SELF WITHOUT BOARD  . Urinary incontinence with continuous leakage   . Wears glasses   . Pressure ulcer, buttock   . Borderline diabetes     DIET CONTROLLED  . Heart palpitations     SECONDARY TO VP SHUNT DRAIN  . History of seizure as newborn     NO MEDS SINCE 34 YRS OLD--  NO ISSUES SINCE  . Neurogenic bowel   . At risk for sleep apnea     STOP-BANG = 4   SENT TO PCP 11-01-2013   Assessment: 33yom with ventricular shunt presented to the ED c/o headache for  one week. He was found to be hypotensive with leukocytosis and also in acute renal failure with sCr 7.4 (baseline ~0.60). He will begin empiric linezolid (vancomycin allergy) and cefepime for sepsis 2/2 shunt infection/obstruction.   Goal of Therapy:  Appropriate dosing  Plan:  1) Cefepime 2g x 1 as ordered then 1g IV q24 2) Linezolid 600mg  IV q12 as ordered 3) Follow renal function, cultures, LOT  Deboraha Sprang 10/26/2014,1:57 AM

## 2014-10-26 NOTE — H&P (Signed)
PULMONARY / CRITICAL CARE MEDICINE HISTORY AND PHYSICAL EXAMINATION   Name: [name] MRN: [id number] DOB: 28-Nov-1980    ADMISSION DATE:  10/25/2014  PRIMARY SERVICE: PCCM  CHIEF COMPLAINT:  Confusion, abdominal pain, headache  BRIEF PATIENT DESCRIPTION: 34 y/o man w/ Spina Bifida, paraplegia, complex surgical history presenting with fevers, renal failure, and hydrocephelaous.   SIGNIFICANT EVENTS / STUDIES:  CT head / shunt series showing acute ventriculomegaly  LINES / TUBES: PIV Ostomy Condom-cath foley  CULTURES: Blood (x2) - 2/13 Urine - 2/13  ANTIBIOTICS: Cefepime 2/24 >> Linezolid 2/14 >> Pip-tazo (x1 dose)  HISTORY OF PRESENT ILLNESS:   [name] is a 34 y/o man w/ Spina Bifida, paraplegia, complex surgical history following a chole with a bowel perforation in October 6503 complicated by ARDS presenting with metabolic disarray and altered mental status. He has had frequent UTIs in the past who presented to [facility] in October of 2014 and had a cholecystectomy which was complicated by perforation of bowel requiring multiple surgical revisions/washouts done at [facility]. He does also have a history of hydrocephalous with VP shunt. When he had his complex abdominal surgical situation, he did have his drain externalized, and his mother reports that the catheter was ultimately placed in his chest, in what sounds like is his pleural space. Ten days prior to admission, the patient had a "gi bug" that his mother had as well, with dehydration, nausea, and poor PO intake. The patient had persistent watery output from his ostomy, which is usual for him. A few days prior to admission, he became more confused, and was "not himself" on the day of admission, so family presented to [facility] ED. Given need for NSGY consult, he was admitted to [facility] to PCCM.  PAST MEDICAL HISTORY :  Past Medical History  Diagnosis Date  . Hypertension   . GERD (gastroesophageal reflux disease)   . Non-healing  surgical wound     ABDOMINE  . 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 Arnold-Chiari malformation   . Neurogenic bladder     INCONTINENT -  . 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 Ileostomy in place   . Wheelchair bound     CAN TRASFER SELF WITHOUT BOARD  . Urinary incontinence with continuous leakage   . Wears glasses   . Pressure ulcer, buttock   . Borderline diabetes     DIET CONTROLLED  . Heart palpitations     SECONDARY TO VP SHUNT DRAIN  . History of seizure as newborn     NO MEDS SINCE 34 YRS OLD--  NO ISSUES SINCE  . Neurogenic bowel   . At risk for sleep apnea     STOP-BANG = 4   SENT TO PCP 11-01-2013   Past Surgical History  Procedure Laterality Date  . Ventriculoperitoneal shunt  LAST REVISION NOV 2014    DUE TO ABD WOUND--- SHUNT DRAINS TO HEART  . Hip surgery Bilateral AS CHILD    TENDON RELEASE  . Cholecystectomy N/A 07/06/2013    Procedure: LAPAROSCOPIC CHOLECYSTECTOMY WITH INTRAOPERATIVE CHOLANGIOGRAM;  Surgeon: Madilyn Hook, DO;  Location: [facility];  Service: General;  Laterality: N/A;  . Colon resection N/A 07/08/2013    Procedure: EXPLORATORY LAPAROSCOPY, DIAGNOSTIC LAPAROSCOPY, PARTIAL COLECTOMY, ABDOMINAL WASHOUT, ABDOMINAL WOUND VAC;  Surgeon: Madilyn Hook, DO;  Location: [facility];  Service: General;  Laterality: N/A;  . Spinal  fixation surgery w/ implant  AS CHILD    [place] R[facility]S  . 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 Umbilical hernia repair  03-30-2007  . Right colectomy/  appendectomy/  ileostomy  NOV 2014  [facility]  . Incision and drainage of wound N/A 11/04/2013    Procedure: IRRIGATION AND DEBRIDEMENT ABDOMINAL WOUND WITH PLACEMENT OF ACELL/VAC;  Surgeon: Theodoro Kos, DO;  Location: [facility];  Service: Plastics;  Laterality: N/A;   Prior to Admission medications   Medication Sig Start Date End Date Taking?  Authorizing Provider  loperamide (IMODIUM A-D) 2 MG tablet Take 2 mg by mouth 4 (four) times daily.    Yes Historical Provider, MD  metoprolol (LOPRESSOR) 50 MG tablet Take 50 mg by mouth 2 (two) times daily.   Yes Historical Provider, MD  Multiple Vitamin (MULTIVITAMIN) tablet Take 1 tablet by mouth daily.   Yes Historical Provider, MD  ondansetron (ZOFRAN) 4 MG tablet Take 4 mg by mouth every 8 (eight) hours as needed for nausea or vomiting.   Yes Historical Provider, MD  oxyCODONE (OXY IR/ROXICODONE) 5 MG immediate release tablet Take 5 mg by mouth every 6 (six) hours as needed. 1 by mouth every 3 hours as needed for MILD pain, take 2 by mouth every 3 hours as needed for MODERATE pain, 3 by mouth every 3 hours as needed for SEVERE pain 08/07/13  Yes Blanchie Serve, MD  acetaminophen (TYLENOL) 650 MG CR tablet Take 1,300 mg by mouth every 8 (eight) hours as needed for pain.    Historical Provider, MD   Allergies  Allergen Reactions  . Latex Hives  . Adhesive [Tape] Hives  . Ciprofloxacin Other (See Comments)    IV only-- caused burning in arm   . Vancomycin Itching and Swelling    FAMILY HISTORY:  No history of paraplegia SOCIAL HISTORY:  reports that he has never smoked. He has never used smokeless tobacco. He reports that he does not drink alcohol or use illicit drugs.  REVIEW OF SYSTEMS:  Cannot assess 2/2 AMS.  SUBJECTIVE:   VITAL SIGNS: Temp:  [97.6 F (36.4 C)] 97.6 F (36.4 C) (02/13 1928) Pulse Rate:  [74-92] 80 (02/14 0015) Resp:  [11-22] 18 (02/14 0015) BP: (67-132)/(46-79) 104/47 mmHg (02/14 0015) SpO2:  [98 %-100 %] 100 % (02/14 0015) Weight:  [161 lb 2.5 oz (73.1 kg)] 161 lb 2.5 oz (73.1 kg) (02/14 0140) HEMODYNAMICS:   VENTILATOR SETTINGS:   INTAKE / OUTPUT: Intake/Output      02/13 0701 - 02/14 0700   I.V. (mL/kg) 800 (10.9)   Total Intake(mL/kg) 800 (10.9)   Net +800         PHYSICAL EXAMINATION: General:  Obese man, lying in bed, NAD Neuro:  Sleepy,  but conversant. HEENT:  Dry MM Cardiovascular:  RRR, normal s1/s2 Lungs:  CTAB Abdomen:  Large, broad surgical scar noted. Fully healed. Large amount of watery discharge from RLQ colostomy Musculoskeletal:  Atrophic lower hemibody  Skin:  intact  LABS:  CBC  Recent Labs Lab 10/25/14 2004  WBC 27.6*  HGB 15.7  HCT 43.8  PLT 333   Coag's No results for input(s): APTT, INR in the last 168 hours. BMET  Recent Labs Lab 10/25/14 2004  NA 120*  K 6.0*  CL 76*  CO2 11*  BUN 140*  CREATININE 7.41*  GLUCOSE 125*   Electrolytes  Recent Labs Lab 10/25/14 2004  CALCIUM 9.8   Sepsis Markers  Recent  Labs Lab 10/25/14 2052 10/25/14 2314  LATICACIDVEN 4.55* 3.74*   ABG No results for input(s): PHART, PCO2ART, PO2ART in the last 168 hours. Liver Enzymes  Recent Labs Lab 10/25/14 2004  AST 49*  ALT 41  ALKPHOS 129*  BILITOT 1.4*  ALBUMIN 4.7   Cardiac Enzymes No results for input(s): TROPONINI, PROBNP in the last 168 hours. Glucose No results for input(s): GLUCAP in the last 168 hours.  Imaging Dg Cervical Spine 2-3 Views  10/25/2014   CLINICAL DATA:  34 year old male VP shunt. Headache. Initial encounter.  EXAM: CERVICAL SPINE - 2-3 VIEW  COMPARISON:  Chest CTA 07/08/2013.  FINDINGS: Large body habitus. Shunt tubing tracks inferiorly in the right neck can't into the chest. There is a blind ending component of tubing suspected. Upper thoracic spinal rods. No acute osseous abnormality identified.  IMPRESSION: Two shunt catheters course inferiorly from the right scalp, 1 appears blind ending. The second continues into the chest. Large body habitus.   Electronically Signed   By: Genevie Ann M.D.   On: 10/25/2014 20:58   Ct Head Wo Contrast  10/25/2014   CLINICAL DATA:  34 year old male with VP shunt and headache. Initial encounter.  EXAM: CT HEAD WITHOUT CONTRAST  TECHNIQUE: Contiguous axial images were obtained from the base of the skull through the vertex without  intravenous contrast.  COMPARISON:  Report of the head CT 02/21/2001 (no images available).  FINDINGS: Visualized paranasal sinuses and mastoids are clear. Right posterior approach shunt catheter with burr hole. Hyperostosis of the calvarium. No acute osseous abnormality identified. Visualized orbit soft tissues are within normal limits.  Right posterior scalp shunt reservoir. Two separate shunt catheters continue inferiorly.  The internal component of the ventriculostomy shunt terminates at the left frontal horn. Dysplastic ventricles. Ventriculomegaly including enlarged temporal horns. Mild periventricular hypodensity compatible with transependymal edema. Effaced basilar cisterns including the cisterna magna. No acute intracranial hemorrhage identified. No extra-axial collection identified. No evidence of cortically based acute infarction identified.  IMPRESSION: 1. Dysplastic ventricles with evidence of acute ventriculomegaly with some transependymal edema. 2. Effaced basilar cisterns including the cisterna magna. 3. Right posterior approach shunt catheter, tip at the left frontal horn.  Study discussed by telephone with Dr. Shirlyn Goltz on 10/25/2014 at 21:01.   Electronically Signed   By: Genevie Ann M.D.   On: 10/25/2014 21:04   Dg Abd Acute W/chest  10/25/2014   CLINICAL DATA:  Ventriculoperitoneal shunt present. Headache for 1 week. Assessing ventriculoperitoneal shunt location  EXAM: ACUTE ABDOMEN SERIES (ABDOMEN 2 VIEW & CHEST 1 VIEW)  COMPARISON:  None.  FINDINGS: PA chest: Ventriculoperitoneal shunt is seen along the right hemithorax. There appears to be coiling of the shunt in the right neck region. Lungs are clear. Heart size is within normal limits. Pulmonary vascularity is normal. There is extensive postoperative change in the thoracic spine with extensive spinal dysraphism present.  Supine and left lateral decubitus abdomen: The shunt catheter tip is seen on the decubitus examination. Tip is just below  the diaphragm in the midline of the right abdomen. The overall bowel gas pattern is unremarkable. No obstruction or free air. Postoperative change in the lumbar spine with spinal dysraphism noted.  IMPRESSION: Shunt catheter is at a level just below the diaphragm in the midline of the abdomen. It is not possible to ascertain that the catheter is in the peritoneum versus in the soft tissues overlying the intra-abdominal contents. Bowel gas pattern unremarkable. Postoperative change in the spine region. Note  that there is swelling of a portion of the shunt catheter in the neck region on the right. No lung edema or consolidation.   Electronically Signed   By: Lowella Grip III M.D.   On: 10/25/2014 21:11    EKG: NSR CXR: none  ASSESSMENT / PLAN:  Active Problems:   Spina bifida   Septic shock   Protein-calorie malnutrition, severe   Obstructed VP shunt   Severe sepsis   Renal failure (ARF), acute on chronic   Severe sepsis with septic shock   PULMONARY A: No issues at this time P:   Suspect may have respiratory acid/base disturbance. Definitely has metabolic disturbance. ABG pending.  CARDIOVASCULAR A: Hypotension P:   Treating for septic shock  RENAL A: Acute renal failure due to severe dehydration. Hyponatremia Hyperkalemia Gap metabolic acidosis Likely NAGMA P:   Likely due to dehydration, treating with IVF; repeating labs pending after 3L bolus. Will trend, hydrate. Inserting indwelling foley as has had no UOP with condom cath.  GASTROINTESTINAL A: Colitis / enteritis P:   Will obtain stool studies; f/u results. Consider imaging of abdomen.  HEMATOLOGIC A: Leukocytosis P:   Due to sepsis  INFECTIOUS A: Severe sepsis P:   Treating broadly with empiric cefepime / linezolid  ENDOCRINE A: DM2 P:   Monitor CBG for now  NEUROLOGIC A: Acute encephalopathy hydrocephalous Possible shunt infection P:   NSGY is consulted; treating with ABX. Will trend  underlying cause of encephalopathy   BEST PRACTICE / DISPOSITION Level of Care:  ICU Primary Service:  PCCM Consultants:  NSGY Code Status:  Full Diet:  NPO DVT Px:  heparin GI Px:  Not indicated Skin Integrity:  Intact Social / Family:  Updated  TODAY'S SUMMARY: 34 y/o man with complex history presenting with sepsis and renal failure. Possible shunt infection.  I have personally obtained a history, examined the patient, evaluated laboratory and imaging results, formulated the assessment and plan and placed orders.  CRITICAL CARE: The patient is critically ill with multiple organ systems failure and requires high complexity decision making for assessment and support, frequent evaluation and titration of therapies, application of advanced monitoring technologies and extensive interpretation of multiple databases. Critical Care Time devoted to patient care services described in this note is 120 minutes.   Luz Brazen, MD Pulmonary and [place] Pager: [phone]   10/26/2014, 3:19 AM

## 2014-10-26 NOTE — ED Notes (Signed)
Pt transported [facility] 2M04 via

## 2014-10-26 NOTE — Consult Note (Signed)
[facility]    Date of Admission:  10/25/2014  Date of Consult:  10/26/2014  Reason for Consult: Sepsis, ? Shunt infection Referring Physician: Dr. Joya Gaskins   HPI: [name] is an 34 y.o. male. 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 When he had his complex abdominal surgical situation, he did have his drain externalized, and his mother reports that the catheter was ultimately placed in his chestTen days prior to admission, the patient had a "gi bug" that his mother had as well, with dehydration, nausea, and poor PO intake. The patient had persistent watery output from his ostomy, which is usual for him. A few days prior to admission, he became more confused, and was "not himself" on the day of admission, so family presented to [facility] ED. Had septic physiology. Urinalysis was performed showed large leukocytes but no microscopic exam was done nitrates were negative. Urine culture was sent and blood cultures were sent. Mother states that the urine looked "infected" asked her when she had noticed that she said she noticed this when the urine sample was obtained during admission.   Plain films had shown question of swelling as of part of the shunt.  CT showed:  1. Dysplastic ventricles with evidence of acute ventriculomegaly with some transependymal edema. 2. Effaced basilar cisterns including the cisterna magna. 3. Right posterior approach shunt catheter, tip at the left frontal horn.   He was transferred to [facility] ICU due to concern for possible shunt infection so neurosurgery consultation could be up taught obtained and potentially the shunt could be tapped. He was started on linezolid and cefepime.  Past Medical History  Diagnosis Date  . Hypertension   . GERD (gastroesophageal reflux disease)   . Non-healing surgical wound     ABDOMINE  . 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 Arnold-Chiari malformation   . Neurogenic bladder     INCONTINENT -  . 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 Ileostomy in place   . Wheelchair bound     CAN TRASFER SELF WITHOUT BOARD  . Urinary incontinence with continuous leakage   . Wears glasses   . Pressure ulcer, buttock   . Borderline diabetes     DIET CONTROLLED  . Heart palpitations     SECONDARY TO VP SHUNT DRAIN  . History of seizure as newborn     NO MEDS SINCE 34 YRS OLD--  NO ISSUES SINCE  . Neurogenic bowel   . At risk for sleep apnea     STOP-BANG = 4   SENT TO PCP 11-01-2013    Past Surgical History  Procedure Laterality Date  . Ventriculoperitoneal shunt  LAST REVISION NOV 2014    DUE TO ABD WOUND--- SHUNT DRAINS TO HEART  . Hip surgery Bilateral AS CHILD    TENDON RELEASE  . Cholecystectomy N/A 07/06/2013    Procedure: LAPAROSCOPIC CHOLECYSTECTOMY WITH INTRAOPERATIVE CHOLANGIOGRAM;  Surgeon: Madilyn Hook, DO;  Location: [facility];  Service: General;  Laterality: N/A;  . Colon resection N/A 07/08/2013    Procedure: EXPLORATORY LAPAROSCOPY, DIAGNOSTIC LAPAROSCOPY, PARTIAL COLECTOMY, ABDOMINAL WASHOUT, ABDOMINAL WOUND VAC;  Surgeon: Madilyn Hook, DO;  Location: [facility];  Service: General;  Laterality: N/A;  . Spinal fixation surgery w/ implant  AS  CHILD    [place] R[facility]S  . 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 Umbilical hernia repair  03-30-2007  . Right colectomy/  appendectomy/  ileostomy  NOV 2014  [facility]  . Incision and drainage of wound N/A 11/04/2013    Procedure: IRRIGATION AND DEBRIDEMENT ABDOMINAL WOUND WITH PLACEMENT OF ACELL/VAC;  Surgeon: Theodoro Kos, DO;  Location: [place];  Service: Plastics;  Laterality: N/A;  ergies:   Allergies  Allergen Reactions  . Latex Hives  . Adhesive [Tape] Hives  . Ciprofloxacin Other (See Comments)     IV only-- caused burning in arm   . Vancomycin Itching and Swelling     Medications: I have reviewed patients current medications as documented in Epic Anti-infectives    Start     Dose/Rate Route Frequency Ordered Stop   10/26/14 2200  ceFEPIme (MAXIPIME) 1 g in dextrose 5 % 50 mL IVPB     1 g 100 mL/hr over 30 Minutes Intravenous Every 24 hours 10/26/14 0157     10/25/14 2230  linezolid (ZYVOX) IVPB 600 mg     600 mg 300 mL/hr over 60 Minutes Intravenous Every 12 hours 10/25/14 2215     10/25/14 2230  ceFEPIme (MAXIPIME) 2 g in dextrose 5 % 50 mL IVPB    Comments:  VP shunt infection   2 g 100 mL/hr over 30 Minutes Intravenous  Once 10/25/14 2216 10/26/14 0256   10/25/14 2145  piperacillin-tazobactam (ZOSYN) IVPB 3.375 g     3.375 g 100 mL/hr over 30 Minutes Intravenous  Once 10/25/14 2142 10/25/14 2222      Social History:  reports that he has never smoked. He has never used smokeless tobacco. He reports that he does not drink alcohol or use illicit drugs.  History reviewed. No pertinent family history.  As in HPI and primary teams notes otherwise 12 point review of systems is negative  Blood pressure 94/46, pulse 72, temperature 98.3 F (36.8 C), temperature source Oral, resp. rate 15, height 5' 2.99" (1.6 m), weight 161 lb 2.5 oz (73.1 kg), SpO2 100 %. General: was wearing a mask because the light is bothering his eyes. Is arousable and can answer some questions though mother chimes in quite frequently HEENT: anicteric sclera, OMI, oropharynx clear and without exudate CVS tachycardic rate, normal r,  no murmur rubs or gallops Chest: clear to auscultation bilaterally, no wheezing, rales or rhonchi Abdomen: soft nontender, nondistended, normal bowel sounds, Extremities: no  clubbing or edema noted bilaterally Skin: stage II decubitus ulcers see pictures  10/26/14:           Neuro: paraplegic  Results for orders placed or performed during the hospital encounter  of 10/25/14 (from the past 48 hour(s))  CBC with Differential     Status: Abnormal   Collection Time: 10/25/14  8:04 PM  Result Value Ref Range   WBC 27.6 (H) 4.0 - 10.5 K/uL   RBC 5.90 (H) 4.22 - 5.81 MIL/uL   Hemoglobin 15.7 13.0 - 17.0 g/dL   HCT 43.8 39.0 - 52.0 %   MCV 74.2 (L) 78.0 - 100.0 fL   MCH 26.6 26.0 - 34.0 pg   MCHC 35.8 30.0 - 36.0 g/dL   RDW 13.2 11.5 - 15.5 %   Platelets 333 150 - 400 K/uL   Neutrophils Relative % 92 (H) 43 - 77 %   Lymphocytes Relative 4 (L) 12 - 46 %   Monocytes Relative 4 3 -  12 %   Eosinophils Relative 0 0 - 5 %   Basophils Relative 0 0 - 1 %   Neutro Abs 25.4 (H) 1.7 - 7.7 K/uL   Lymphs Abs 1.1 0.7 - 4.0 K/uL   Monocytes Absolute 1.1 (H) 0.1 - 1.0 K/uL   Eosinophils Absolute 0.0 0.0 - 0.7 K/uL   Basophils Absolute 0.0 0.0 - 0.1 K/uL   WBC Morphology MILD LEFT SHIFT (1-5% METAS, OCC MYELO, OCC BANDS)   Comprehensive metabolic panel     Status: Abnormal   Collection Time: 10/25/14  8:04 PM  Result Value Ref Range   Sodium 120 (L) 135 - 145 mmol/L   Potassium 6.0 (H) 3.5 - 5.1 mmol/L    Comment: MODERATE HEMOLYSIS HEMOLYSIS AT THIS LEVEL MAY AFFECT RESULT    Chloride 76 (L) 96 - 112 mmol/L   CO2 11 (L) 19 - 32 mmol/L   Glucose, Bld 125 (H) 70 - 99 mg/dL   BUN 140 (H) 6 - 23 mg/dL    Comment: RESULTS CONFIRMED BY MANUAL DILUTION   Creatinine, Ser 7.41 (H) 0.50 - 1.35 mg/dL   Calcium 9.8 8.4 - 10.5 mg/dL   Total Protein 10.2 (H) 6.0 - 8.3 g/dL   Albumin 4.7 3.5 - 5.2 g/dL   AST 49 (H) 0 - 37 U/L   ALT 41 0 - 53 U/L   Alkaline Phosphatase 129 (H) 39 - 117 U/L   Total Bilirubin 1.4 (H) 0.3 - 1.2 mg/dL   GFR calc non Af Amer 9 (L) >90 mL/min   GFR calc Af Amer 10 (L) >90 mL/min    Comment: (NOTE) The eGFR has been calculated using the CKD EPI equation. This calculation has not been validated in all clinical situations. eGFR's persistently <90 mL/min signify possible Chronic Kidney Disease.    Anion gap 33 (H) 5 - 15  I-Stat CG4  Lactic Acid, ED     Status: Abnormal   Collection Time: 10/25/14  8:52 PM  Result Value Ref Range   Lactic Acid, Venous 4.55 (HH) 0.5 - 2.0 mmol/L   Comment NOTIFIED PHYSICIAN   Urinalysis, Routine w reflex microscopic     Status: Abnormal   Collection Time: 10/25/14  9:41 PM  Result Value Ref Range   Color, Urine RED (A) YELLOW    Comment: BIOCHEMICALS MAY BE AFFECTED BY COLOR   APPearance TURBID (A) CLEAR   Specific Gravity, Urine 1.019 1.005 - 1.030   pH 5.0 5.0 - 8.0   Glucose, UA NEGATIVE NEGATIVE mg/dL   Hgb urine dipstick LARGE (A) NEGATIVE   Bilirubin Urine NEGATIVE NEGATIVE   Ketones, ur NEGATIVE NEGATIVE mg/dL   Protein, ur 100 (A) NEGATIVE mg/dL   Urobilinogen, UA 0.2 0.0 - 1.0 mg/dL   Nitrite NEGATIVE NEGATIVE   Leukocytes, UA LARGE (A) NEGATIVE  Urine microscopic-add on     Status: None   Collection Time: 10/25/14  9:41 PM  Result Value Ref Range   Urine-Other FIELD OBSCURED BY WBC'S   I-Stat CG4 Lactic Acid, ED     Status: Abnormal   Collection Time: 10/25/14 11:14 PM  Result Value Ref Range   Lactic Acid, Venous 3.74 (HH) 0.5 - 2.0 mmol/L   Comment NOTIFIED PHYSICIAN   Glucose, capillary     Status: Abnormal   Collection Time: 10/26/14  1:15 AM  Result Value Ref Range   Glucose-Capillary 116 (H) 70 - 99 mg/dL  MRSA PCR Screening     Status: None   Collection  Time: 10/26/14  1:39 AM  Result Value Ref Range   MRSA by PCR NEGATIVE NEGATIVE    Comment:        The GeneXpert MRSA Assay (FDA approved for NASAL specimens only), is one component of a comprehensive MRSA colonization surveillance program. It is not intended to diagnose MRSA infection nor to guide or monitor treatment for MRSA infections.   CBC     Status: Abnormal   Collection Time: 10/26/14  3:26 AM  Result Value Ref Range   WBC 13.3 (H) 4.0 - 10.5 K/uL   RBC 4.25 4.22 - 5.81 MIL/uL   Hemoglobin 10.9 (L) 13.0 - 17.0 g/dL   HCT 31.3 (L) 39.0 - 52.0 %   MCV 73.6 (L) 78.0 - 100.0 fL   MCH  25.6 (L) 26.0 - 34.0 pg   MCHC 34.8 30.0 - 36.0 g/dL   RDW 13.2 11.5 - 15.5 %   Platelets 235 150 - 400 K/uL  Basic metabolic panel     Status: Abnormal   Collection Time: 10/26/14  3:26 AM  Result Value Ref Range   Sodium 133 (L) 135 - 145 mmol/L   Potassium 3.0 (L) 3.5 - 5.1 mmol/L   Chloride 97 96 - 112 mmol/L   CO2 20 19 - 32 mmol/L   Glucose, Bld 109 (H) 70 - 99 mg/dL   BUN 105 (H) 6 - 23 mg/dL   Creatinine, Ser 4.76 (H) 0.50 - 1.35 mg/dL   Calcium 8.0 (L) 8.4 - 10.5 mg/dL   GFR calc non Af Amer 15 (L) >90 mL/min   GFR calc Af Amer 17 (L) >90 mL/min    Comment: (NOTE) The eGFR has been calculated using the CKD EPI equation. This calculation has not been validated in all clinical situations. eGFR's persistently <90 mL/min signify possible Chronic Kidney Disease.    Anion gap 16 (H) 5 - 15  Procalcitonin - Baseline     Status: None   Collection Time: 10/26/14  3:26 AM  Result Value Ref Range   Procalcitonin 1.38 ng/mL    Comment:        Interpretation: PCT > 0.5 ng/mL and <= 2 ng/mL: Systemic infection (sepsis) is possible, but other conditions are known to elevate PCT as well. (NOTE)         ICU PCT Algorithm               Non ICU PCT Algorithm    ----------------------------     ------------------------------         PCT < 0.25 ng/mL                 PCT < 0.1 ng/mL     Stopping of antibiotics            Stopping of antibiotics       strongly encouraged.               strongly encouraged.    ----------------------------     ------------------------------       PCT level decrease by               PCT < 0.25 ng/mL       >= 80% from peak PCT       OR PCT 0.25 - 0.5 ng/mL          Stopping of antibiotics  encouraged.     Stopping of antibiotics           encouraged.    ----------------------------     ------------------------------       PCT level decrease by              PCT >= 0.25 ng/mL       < 80% from peak PCT         AND PCT >= 0.5 ng/mL             Continuing antibiotics                                              encouraged.       Continuing antibiotics            encouraged.    ----------------------------     ------------------------------     PCT level increase compared          PCT > 0.5 ng/mL         with peak PCT AND          PCT >= 0.5 ng/mL             Escalation of antibiotics                                          strongly encouraged.      Escalation of antibiotics        strongly encouraged.   Lactic acid, plasma     Status: Abnormal   Collection Time: 10/26/14  3:27 AM  Result Value Ref Range   Lactic Acid, Venous 3.0 (HH) 0.5 - 2.0 mmol/L    Comment: REPEATED TO VERIFY CRITICAL RESULT CALLED TO, READ BACK BY AND VERIFIED WITH: Kennon Rounds [id number] WILDERK   Clostridium Difficile by PCR     Status: None   Collection Time: 10/26/14  4:04 AM  Result Value Ref Range   C difficile by pcr NEGATIVE NEGATIVE  Glucose, capillary     Status: Abnormal   Collection Time: 10/26/14  4:33 AM  Result Value Ref Range   Glucose-Capillary 115 (H) 70 - 99 mg/dL   Comment 1 Notify RN   I-STAT 3, venous blood gas (G3P V)     Status: Abnormal   Collection Time: 10/26/14  6:04 AM  Result Value Ref Range   pH, Ven 7.325 (H) 7.250 - 7.300   pCO2, Ven 38.3 (L) 45.0 - 50.0 mmHg   pO2, Ven 27.0 (LL) 30.0 - 45.0 mmHg   Bicarbonate 20.1 20.0 - 24.0 mEq/L   TCO2 21 0 - 100 mmol/L   O2 Saturation 48.0 %   Acid-base deficit 6.0 (H) 0.0 - 2.0 mmol/L   Patient temperature 97.3 F    Sample type VENOUS    Comment VALUES EXPECTED, NO REPEAT   Glucose, capillary     Status: None   Collection Time: 10/26/14  7:04 AM  Result Value Ref Range   Glucose-Capillary 89 70 - 99 mg/dL  Glucose, capillary     Status: None   Collection Time: 10/26/14 11:17 AM  Result Value Ref Range   Glucose-Capillary 91 70 - 99 mg/dL   '@BRIEFLABTABLE' (sdes,specrequest,cult,reptstatus)   ) Recent Results (from the past  720 hour(s))  MRSA PCR Screening     Status: None   Collection Time: 10/26/14  1:39 AM  Result Value Ref Range Status   MRSA by PCR NEGATIVE NEGATIVE Final    Comment:        The GeneXpert MRSA Assay (FDA approved for NASAL specimens only), is one component of a comprehensive MRSA colonization surveillance program. It is not intended to diagnose MRSA infection nor to guide or monitor treatment for MRSA infections.   Clostridium Difficile by PCR     Status: None   Collection Time: 10/26/14  4:04 AM  Result Value Ref Range Status   C difficile by pcr NEGATIVE NEGATIVE Final     Impression/Recommendation  Active Problems:   Spina bifida   Septic shock   Protein-calorie malnutrition, severe   Obstructed VP shunt   Severe sepsis   Renal failure (ARF), acute on chronic   Severe sepsis with septic shock   [name] is a 35 y.o. male with  VP shunt admitted with sepsis  #1 Sepsis: Multiple possible origins for sepsis, but obviously we are concerned about potential shunt infection given the ventriculomegaly that was read as being an acute finding by radiology.  Dr. Ronnald Ramp has seen the patient and neurosurgery as not convinced of the acuity of the findings on CT scan and is also not convinced the patient has a shunt infection. He does not feel that a shunt aspiration is warranted at this point in time  For now I would continue zyvox and cefepime--though we  Could also Consider trial of vancomycin if we need coverage for MRSA as the patient's vancomycin allergy is not entirely convincing. He had a history of turning red when he was a child when given an infusion had redness in his face but no hives sounds like a "red man syndrome to vancomycin which is typical and which can be overcome by slowing the infusion.  Follow-up cultures from blood and urine follow clinical status  Would recommend obtaining films from outside hospitals to compare to once here to see if the findings on  CT scan are acute or not. I wonder if this can be done through some transmission of the records electronically given that [facility] and common both share Epic?    10/26/2014, 1:45 PM   Thank you so much for this interesting consult  [facility] [place] [phone] (pager) [phone] (office) 10/26/2014, 1:45 PM  [place] 10/26/2014, 1:45 PM

## 2014-10-26 NOTE — ED Notes (Signed)
Pt was transported to [facility] via EMS.  Charge nurse was standing in nursing station with EMS who said they were willing to transport.  I called [facility] states that a truck was not sent for him because the Sec told [facility] was to transport.  Tammy states she can put him back on the list but it will be at least 45 min to 1 hr before they could get the pt for transport to [facility]. I looked at my [place] and she said let EMS transport.

## 2014-10-26 NOTE — Progress Notes (Addendum)
CRITICAL VALUE ALERT  Critical value received:  Lactic Acid 3.0  Date of notification:  10/26/2014  Time of notification:  1610  Critical value read back:Yes.    Nurse who received alert:  [place]   MD notified (1st page):  Dr. Joya Gaskins  Time of first page:  [phone]  Responding MD:  Dr. Joya Gaskins  Time MD responded:  [phone]

## 2014-10-26 NOTE — Progress Notes (Signed)
[facility] Progress Note Patient Name: [name] DOB: 1980/10/28 MRN: [id number]   Date of Service  10/26/2014  HPI/Events of Note  Pt with VP shunt obstruction. Neurosurgery aware.  Pt with AKI, hyperkalemia, septic shock.  eICU Interventions  Adm ICU, IV ABX, ID consult ,f/u with neurosurgery for plan for VP shunt, IV zyvox/cefepime (discussed with dr Tommy Medal who recommends this rx in light of vancomycin allergy.)  See full Pccm H and P to follow      Intervention Category Major Interventions: Shock - evaluation and management;Sepsis - evaluation and management Evaluation Type: New Patient Evaluation  Asencion Noble 10/26/2014, 1:35 AM

## 2014-10-27 ENCOUNTER — Inpatient Hospital Stay (HOSPITAL_COMMUNITY): Payer: Medicare Other

## 2014-10-27 DIAGNOSIS — N390 Urinary tract infection, site not specified: Secondary | ICD-10-CM

## 2014-10-27 LAB — BASIC METABOLIC PANEL
ANION GAP: 6 (ref 5–15)
ANION GAP: 4 — AB (ref 5–15)
BUN: 37 mg/dL — AB (ref 6–23)
BUN: 19 mg/dL (ref 6–23)
CALCIUM: 7.6 mg/dL — AB (ref 8.4–10.5)
CO2: 22 mmol/L (ref 19–32)
CO2: 20 mmol/L (ref 19–32)
Calcium: 7.6 mg/dL — ABNORMAL LOW (ref 8.4–10.5)
Chloride: 113 mmol/L — ABNORMAL HIGH (ref 96–112)
Chloride: 114 mmol/L — ABNORMAL HIGH (ref 96–112)
Creatinine, Ser: 0.76 mg/dL (ref 0.50–1.35)
Creatinine, Ser: 0.69 mg/dL (ref 0.50–1.35)
GFR calc Af Amer: >90 mL/min (ref >90)
GFR calc non Af Amer: >90 mL/min (ref >90)
GFR calc non Af Amer: >90 mL/min (ref >90)
GLUCOSE: 92 mg/dL (ref 70–99)
Glucose, Bld: 93 mg/dL (ref 70–99)
POTASSIUM: 2.7 mmol/L — AB (ref 3.5–5.1)
Potassium: 3.3 mmol/L — ABNORMAL LOW (ref 3.5–5.1)
SODIUM: 141 mmol/L (ref 135–145)
Sodium: 138 mmol/L (ref 135–145)

## 2014-10-27 LAB — CBC
HEMATOCRIT: 26.4 % — AB (ref 39.0–52.0)
HEMOGLOBIN: 9.1 g/dL — AB (ref 13.0–17.0)
MCH: 25.3 pg — ABNORMAL LOW (ref 26.0–34.0)
MCHC: 34.5 g/dL (ref 30.0–36.0)
MCV: 73.3 fL — AB (ref 78.0–100.0)
Platelets: 210 10*3/uL (ref 150–400)
RBC: 3.6 MIL/uL — AB (ref 4.22–5.81)
RDW: 13.3 % (ref 11.5–15.5)
WBC: 6.4 10*3/uL (ref 4.0–10.5)

## 2014-10-27 LAB — GLUCOSE, CAPILLARY
GLUCOSE-CAPILLARY: 108 mg/dL — AB (ref 70–99)
GLUCOSE-CAPILLARY: 79 mg/dL (ref 70–99)
GLUCOSE-CAPILLARY: 87 mg/dL (ref 70–99)
Glucose-Capillary: 100 mg/dL — ABNORMAL HIGH (ref 70–99)
Glucose-Capillary: 102 mg/dL — ABNORMAL HIGH (ref 70–99)
Glucose-Capillary: 96 mg/dL (ref 70–99)

## 2014-10-27 LAB — PROCALCITONIN: PROCALCITONIN: 0.24 ng/mL

## 2014-10-27 LAB — HEPATITIS B SURFACE ANTIGEN: Hepatitis B Surface Ag: NEGATIVE

## 2014-10-27 LAB — LACTIC ACID, PLASMA: LACTIC ACID, VENOUS: 3 mmol/L — AB (ref 0.5–2.0)

## 2014-10-27 MED ORDER — DEXTROSE 5 % IV SOLN
1.0000 g | Freq: Two times a day (BID) | INTRAVENOUS | Status: DC
  Administered 2014-10-27 – 2014-10-29 (×5): 1 g via INTRAVENOUS
  Filled 2014-10-27 (×9): qty 1

## 2014-10-27 MED ORDER — INSULIN ASPART 100 UNIT/ML ~~LOC~~ SOLN: 0.0000 [IU] | Freq: Every day | SUBCUTANEOUS | Status: DC

## 2014-10-27 MED ORDER — POTASSIUM CHLORIDE 10 MEQ/100ML IV SOLN
10.0000 meq | INTRAVENOUS | Status: AC
  Administered 2014-10-27 (×4): 10 meq via INTRAVENOUS
  Filled 2014-10-27: qty 100

## 2014-10-27 MED ORDER — INSULIN ASPART 100 UNIT/ML ~~LOC~~ SOLN: 0.0000 [IU] | Freq: Three times a day (TID) | SUBCUTANEOUS | Status: DC

## 2014-10-27 MED ORDER — POTASSIUM CHLORIDE CRYS ER 20 MEQ PO TBCR
40.0000 meq | EXTENDED_RELEASE_TABLET | Freq: Once | ORAL | Status: AC
  Administered 2014-10-27: 40 meq via ORAL
  Filled 2014-10-27: qty 2

## 2014-10-27 NOTE — Progress Notes (Signed)
[facility] Physician Progress Note and Electrolyte Replacement  Patient Name: [name] DOB: 05-28-1981 MRN: [id number]  Date of Service  10/27/2014   HPI/Events of Note    Recent Labs Lab 10/25/14 2004 10/26/14 0326 10/26/14 1617 10/27/14 0415  NA 120* 133* 140 141  K 6.0* 3.0* 2.6* 2.7*  CL 76* 97 108 113*  CO2 11* 20 20 22   GLUCOSE 125* 109* 117* 92  BUN 140* 105* 67* 37*  CREATININE 7.41* 4.76* 1.75* 0.76  CALCIUM 9.8 8.0* 8.0* 7.6*    Estimated Creatinine Clearance: 120 mL/min (by C-G formula based on Cr of 0.76).  Intake/Output      02/14 0701 - 02/15 0700   P.O. 720   I.V. (mL/kg) 2750 (37.6)   Other 0   IV Piggyback 2950   Total Intake(mL/kg) 6420 (87.8)   Urine (mL/kg/hr) 2950 (1.7)   Stool 1300 (0.7)   Total Output 4250   Net +2170        - I/O DETAILED x 24h    Total I/O In: 2580 [P.O.:480; I.V.:1250; IV Piggyback:850] Out: 2800 [Urine:1500; Stool:1300] - I/O THIS SHIFT    ASSESSMENT Low k - severe  eICURN Interventions  kcl 4meq x 1 po kcl 74meq x IV Check mag and phos   ASSESSMENT: MAJOR ELECTROLYTE      Dr. Brand Males, M.D., [facility].C.P Pulmonary and Critical Care Medicine Staff Physician [facility] Pager: [phone], If no answer or between  15:00h - 7:00h: call [phone]  10/27/2014 6:09 AM

## 2014-10-27 NOTE — Progress Notes (Signed)
PULMONARY / CRITICAL CARE MEDICINE HISTORY AND PHYSICAL EXAMINATION   Name: [name] MRN: [id number] DOB: 04/13/1981    ADMISSION DATE:  10/25/2014  PRIMARY SERVICE: PCCM  CHIEF COMPLAINT:  Confusion, abdominal pain, headache  BRIEF PATIENT DESCRIPTION: 34 y/o man w/ Spina Bifida, paraplegia, complex surgical history presenting with fevers, renal failure, and hydrocephelaous.   SIGNIFICANT EVENTS / STUDIES:  CT head / shunt series 2/14>> acute ventriculomegaly  LINES / TUBES: PIV Ostomy Condom-cath foley  CULTURES: Blood (x2) - 2/13 Urine - 2/13  ANTIBIOTICS: Cefepime 2/24 >> Linezolid 2/14 >> Pip-tazo (x1 dose)  HISTORY OF PRESENT ILLNESS:   Mr. Weekly is a 34 y/o man w/ Spina Bifida, paraplegia, complex surgical history following a chole with a bowel perforation in October 6948 complicated by ARDS presenting with metabolic disarray and altered mental status. He has had frequent UTIs in the past who presented to [facility] in October of 2014 and had a cholecystectomy which was complicated by perforation of bowel requiring multiple surgical revisions/washouts done at [facility]. He does also have a history of hydrocephalous with VP shunt. When he had his complex abdominal surgical situation, he did have his drain externalized, and his mother reports that the catheter was ultimately placed in his chest, in what sounds like is his pleural space. Ten days prior to admission, the patient had a "gi bug" that his mother had as well, with dehydration, nausea, and poor PO intake. The patient had persistent watery output from his ostomy, which is usual for him. A few days prior to admission, he became more confused, and was "not himself" on the day of admission, so family presented to [facility] ED. Given need for NSGY consult, he was admitted to [facility] to PCCM.   SUBJECTIVE: Headache improved. Afebrile overnight. Blood pressures remain on softer side with SBP of 100s. Patient feels better overall. Mother  at bedside.  VITAL SIGNS: Temp:  [97.1 F (36.2 C)-98.3 F (36.8 C)] 97.5 F (36.4 C) (02/15 0854) Pulse Rate:  [59-105] 71 (02/15 0800) Resp:  [10-22] 18 (02/15 0800) BP: (77-108)/(37-81) 105/64 mmHg (02/15 0800) SpO2:  [99 %-100 %] 100 % (02/15 0800) Weight:  [166 lb 10.7 oz (75.6 kg)] 166 lb 10.7 oz (75.6 kg) (02/15 0500) HEMODYNAMICS:   VENTILATOR SETTINGS:   INTAKE / OUTPUT: Intake/Output      02/14 0701 - 02/15 0700 02/15 0701 - 02/16 0700   P.O. 918    I.V. (mL/kg) 3000 (39.7) 375 (5)   Other 0    IV Piggyback 3050 600   Total Intake(mL/kg) 6968 (92.2) 975 (12.9)   Urine (mL/kg/hr) 3200 (1.8) 185 (0.6)   Stool 1300 (0.7)    Total Output 4500 185   Net +2468 +790          PHYSICAL EXAMINATION: General:  Obese man, lying in bed, NAD Neuro:  awake, conversant. HEENT:  Dry MM Cardiovascular:  RRR, normal s1/s2 Lungs:  CTAB Abdomen:  Large, broad surgical scar noted. Fully healed.  Musculoskeletal:  Atrophic lower hemibody  Skin:  intact  LABS:  CBC  Recent Labs Lab 10/25/14 2004 10/26/14 0326 10/27/14 0415  WBC 27.6* 13.3* 6.4  HGB 15.7 10.9* 9.1*  HCT 43.8 31.3* 26.4*  PLT 333 235 210   Coag's No results for input(s): APTT, INR in the last 168 hours. BMET  Recent Labs Lab 10/26/14 0326 10/26/14 1617 10/27/14 0415  NA 133* 140 141  K 3.0* 2.6* 2.7*  CL 97 108 113*  CO2 20  20 22  BUN 105* 67* 37*  CREATININE 4.76* 1.75* 0.76  GLUCOSE 109* 117* 92   Electrolytes  Recent Labs Lab 10/26/14 0326 10/26/14 1617 10/27/14 0415  CALCIUM 8.0* 8.0* 7.6*   Sepsis Markers  Recent Labs Lab 10/25/14 2052 10/25/14 2314 10/26/14 0326 10/26/14 0327 10/27/14 0415  LATICACIDVEN 4.55* 3.74*  --  3.0*  --   PROCALCITON  --   --  1.38  --  0.24   ABG No results for input(s): PHART, PCO2ART, PO2ART in the last 168 hours. Liver Enzymes  Recent Labs Lab 10/25/14 2004  AST 49*  ALT 41  ALKPHOS 129*  BILITOT 1.4*  ALBUMIN 4.7   Cardiac  Enzymes No results for input(s): TROPONINI, PROBNP in the last 168 hours. Glucose  Recent Labs Lab 10/26/14 0704 10/26/14 1117 10/26/14 1504 10/26/14 1956 10/27/14 0009 10/27/14 0404  GLUCAP 89 91 97 131* 96 100*    Imaging Dg Cervical Spine 2-3 Views  10/25/2014   CLINICAL DATA:  34 year old male VP shunt. Headache. Initial encounter.  EXAM: CERVICAL SPINE - 2-3 VIEW  COMPARISON:  Chest CTA 07/08/2013.  FINDINGS: Large body habitus. Shunt tubing tracks inferiorly in the right neck can't into the chest. There is a blind ending component of tubing suspected. Upper thoracic spinal rods. No acute osseous abnormality identified.  IMPRESSION: Two shunt catheters course inferiorly from the right scalp, 1 appears blind ending. The second continues into the chest. Large body habitus.   Electronically Signed   By: Genevie Ann M.D.   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 Hospital admission for sepsis.  EXAM: CT HEAD WITHOUT CONTRAST  TECHNIQUE: Contiguous axial images were obtained from the base of the skull through the vertex without intravenous contrast.  COMPARISON:  CT of the head October 25, 2014  FINDINGS: Ventriculoperitoneal shunt via RIGHT parietal burr hole, distal tip in the frontal horn of LEFT lateral ventricle. Again noted is irregular configuration of the ventricles with steer horn appearance consistent with agenesis of the corpus callosum. Patchy periventricular white matter hypodensities, unchanged. Poor visualization of the sulci, which may in part reflect sequelae of hydrocephalus and congenital in etiology, motion. No intraparenchymal hemorrhage, midline shift. No acute large vascular territory infarct. Featureless posterior fossa with cerebellar tonsillar descent below the foramen magnum consistent with provided history.  Mild paranasal sinus mucosal thickening without  air-fluid levels. The mastoid air cells are well aerated.  IMPRESSION: Stable appearance of ventriculoperitoneal shunt with distal tip at LEFT frontal horn of lateral ventricle. Confluent periventricular RIGHT matter hypodensities may reflect interstitial edema, recommend correlation with shunt pressures.  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   Electronically Signed   By: Elon Alas   On: 10/27/2014 05:28   Ct Head Wo Contrast  10/25/2014   CLINICAL DATA:  34 year old male with VP shunt and headache. Initial encounter.  EXAM: CT HEAD WITHOUT CONTRAST  TECHNIQUE: Contiguous axial images were obtained from the base of the skull through the vertex without intravenous contrast.  COMPARISON:  Report of the head CT 02/21/2001 (no images available).  FINDINGS: Visualized paranasal sinuses and mastoids are clear. Right posterior approach shunt catheter with burr hole. Hyperostosis of the calvarium. No acute osseous abnormality identified. Visualized orbit soft tissues are within normal limits.  Right posterior scalp shunt reservoir. Two separate shunt catheters continue inferiorly.  The internal component  of the ventriculostomy shunt terminates at the left frontal horn. Dysplastic ventricles. Ventriculomegaly including enlarged temporal horns. Mild periventricular hypodensity compatible with transependymal edema. Effaced basilar cisterns including the cisterna magna. No acute intracranial hemorrhage identified. No extra-axial collection identified. No evidence of cortically based acute infarction identified.  IMPRESSION: 1. Dysplastic ventricles with evidence of acute ventriculomegaly with some transependymal edema. 2. Effaced basilar cisterns including the cisterna magna. 3. Right posterior approach shunt catheter, tip at the left frontal horn.  Study discussed by telephone with Dr. Shirlyn Goltz on 10/25/2014 at 21:01.   Electronically Signed   By: Genevie Ann M.D.   On: 10/25/2014 21:04    Dg Abd Acute W/chest  10/25/2014   CLINICAL DATA:  Ventriculoperitoneal shunt present. Headache for 1 week. Assessing ventriculoperitoneal shunt location  EXAM: ACUTE ABDOMEN SERIES (ABDOMEN 2 VIEW & CHEST 1 VIEW)  COMPARISON:  None.  FINDINGS: PA chest: Ventriculoperitoneal shunt is seen along the right hemithorax. There appears to be coiling of the shunt in the right neck region. Lungs are clear. Heart size is within normal limits. Pulmonary vascularity is normal. There is extensive postoperative change in the thoracic spine with extensive spinal dysraphism present.  Supine and left lateral decubitus abdomen: The shunt catheter tip is seen on the decubitus examination. Tip is just below the diaphragm in the midline of the right abdomen. The overall bowel gas pattern is unremarkable. No obstruction or free air. Postoperative change in the lumbar spine with spinal dysraphism noted.  IMPRESSION: Shunt catheter is at a level just below the diaphragm in the midline of the abdomen. It is not possible to ascertain that the catheter is in the peritoneum versus in the soft tissues overlying the intra-abdominal contents. Bowel gas pattern unremarkable. Postoperative change in the spine region. Note that there is swelling of a portion of the shunt catheter in the neck region on the right. No lung edema or consolidation.   Electronically Signed   By: Lowella Grip III M.D.   On: 10/25/2014 21:11    EKG: NSR CXR: none  ASSESSMENT / PLAN:  Active Problems:   Spina bifida   Septic shock   Protein-calorie malnutrition, severe   Obstructed VP shunt   Severe sepsis   Renal failure (ARF), acute on chronic   Severe sepsis with septic shock   UTI (lower urinary tract infection)   PULMONARY A: No issues at this time P:   [place]   CARDIOVASCULAR A: Hypotension, stable P:   Treating for septic shock, not requiring pressors  RENAL A: Acute renal failure due to severe  dehydration. Hyponatremia Hyperkalemia Gap metabolic acidosis Likely NAGMA P:   Continue IVFs Replete electrolytes  GASTROINTESTINAL A: Colitis / enteritis c diff neg P:    Consider imaging of abdomen depending on cultures  HEMATOLOGIC A: Leukocytosis P:   Due to sepsis  INFECTIOUS A: Severe sepsis P:   Treating broadly with empiric cefepime / linezolid ID input  ENDOCRINE A: DM2 P:   Monitor CBG for now  NEUROLOGIC A: Acute encephalopathy hydrocephalous Possible shunt infection P:   NSGY is consulted; treating with ABX -defer need for CSF analysis to them    Cordelia Poche, MD PGY-2, [facility] 10/27/2014, 12:14 PM

## 2014-10-27 NOTE — Progress Notes (Signed)
Blood cultures remain negative at present. This is a good sign given the fact that his shunt is a ventricular atrial shunt. CT scan was reviewed in his ventricular size remained stable. I am not impressed by the reading of transependymal absorption. At this point I assume this is a working shunt and I think the risk of tapping the shunt outweighs the risk of continued observation.

## 2014-10-27 NOTE — Clinical Documentation Improvement (Signed)
Potassium as below.  10/27/14 progress note states "low K - severe".  KCL IV and po ordered.  Please identify any associated clinical conditions and document in your progress note and discharge summary.    Component      Potassium  Latest Ref Rng      3.5 - 5.1 mmol/L  10/25/2014     8:04 PM 6.0 (H)  10/26/2014     3:26 AM 3.0 (L)  10/26/2014     4:17 PM 2.6 (LL)  10/27/2014     4:15 AM 2.7 (LL)   Possible Clinical Condition: -hypokalemia -other condition (please specify) -unable to determine at present  Thank you, Mateo Flow, RN [phone] Clinical Documentation Specialist

## 2014-10-27 NOTE — Progress Notes (Signed)
ANTIBIOTIC CONSULT NOTE - FOLLOW UP  Pharmacy Consult:  Cefepime Indication:  Sepsis secondary to shunt infection  Allergies  Allergen Reactions  . Latex Hives  . Adhesive [Tape] Hives  . Ciprofloxacin Other (See Comments)    IV only-- caused burning in arm   . Vancomycin Itching and Swelling    Patient Measurements: Height: 5' 2.99" (160 cm) Weight: 166 lb 10.7 oz (75.6 kg) IBW/kg (Calculated) : 56.88  Vital Signs: Temp: 97.1 F (36.2 C) (02/15 0400) Temp Source: Oral (02/15 0400) BP: 103/51 mmHg (02/15 0700) Pulse Rate: 81 (02/15 0700) Intake/Output from previous day: 02/14 0701 - 02/15 0700 In: 4401 [P.O.:918; I.V.:3000; IV Piggyback:3050] Out: 4500 [Urine:3200; UUVOZ:3664]  Labs:  Recent Labs  10/25/14 2004 10/26/14 0326 10/26/14 1617 10/27/14 0415  WBC 27.6* 13.3*  --  6.4  HGB 15.7 10.9*  --  9.1*  PLT 333 235  --  210  CREATININE 7.41* 4.76* 1.75* 0.76   Estimated Creatinine Clearance: 121.9 mL/min (by C-G formula based on Cr of 0.76). No results for input(s): VANCOTROUGH, VANCOPEAK, VANCORANDOM, GENTTROUGH, GENTPEAK, GENTRANDOM, TOBRATROUGH, TOBRAPEAK, TOBRARND, AMIKACINPEAK, AMIKACINTROU, AMIKACIN in the last 72 hours.   Microbiology: Recent Results (from the past 720 hour(s))  Blood culture (routine x 2)     Status: None (Preliminary result)   Collection Time: 10/25/14  9:00 PM  Result Value Ref Range Status   Specimen Description BLOOD RIGHT ANTECUBITAL  Final   Special Requests BOTTLES DRAWN AEROBIC ONLY 5CC  Final   Culture   Final           BLOOD CULTURE RECEIVED NO GROWTH TO DATE CULTURE WILL BE HELD FOR 5 DAYS BEFORE ISSUING A FINAL NEGATIVE REPORT Performed at [facility]    Report Status PENDING  Incomplete  Blood culture (routine x 2)     Status: None (Preliminary result)   Collection Time: 10/25/14  9:11 PM  Result Value Ref Range Status   Specimen Description BLOOD RIGHT ANTECUBITAL  Final   Special Requests BOTTLES DRAWN  AEROBIC ONLY 5CC  Final   Culture   Final           BLOOD CULTURE RECEIVED NO GROWTH TO DATE CULTURE WILL BE HELD FOR 5 DAYS BEFORE ISSUING A FINAL NEGATIVE REPORT Performed at [facility]    Report Status PENDING  Incomplete  MRSA PCR Screening     Status: None   Collection Time: 10/26/14  1:39 AM  Result Value Ref Range Status   MRSA by PCR NEGATIVE NEGATIVE Final    Comment:        The GeneXpert MRSA Assay (FDA approved for NASAL specimens only), is one component of a comprehensive MRSA colonization surveillance program. It is not intended to diagnose MRSA infection nor to guide or monitor treatment for MRSA infections.   Clostridium Difficile by PCR     Status: None   Collection Time: 10/26/14  4:04 AM  Result Value Ref Range Status   C difficile by pcr NEGATIVE NEGATIVE Final      Assessment: 4 YOM with ventricular shunt presented to the ED complaining of a headache for one week.  He was hypotensive and with AKI and started on linezolid and cefepime for sepsis secondary to shunt infection.  His AKI is resolving.  Noted patient has a history of spinal bifida and paraplegia, which would falsely elevate patient's calculated renal clearance.  Linezolid 2/13 >> Cefepime 2/14 >>  2/14 Cdiff - negative 2/14 MRSA PCR -  negative 2/13 Urine - 2/13 Blood x2 -   Goal of Therapy:  Clearance of infection   Plan:  - Change cefepime to 1gm IV Q12H - Continue Zyvox per MD - Monitor renal fxn, micro data   Adaiah Jaskot D. Mina Marble, PharmD, BCPS Pager:  [phone] 10/27/2014, 7:39 AM

## 2014-10-27 NOTE — Consult Note (Signed)
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  Wound bed: 95% pale pink and moist.  5 % slough in one area. Drainage (amount, consistency, odor) Minimal serosanguinous drainage.  No odor.  Periwound: Intact.  Allevyn sacral dressing in place prophylactically. Skin is intact in this area.  Dressing procedure/placement/frequency: Cleanse ulcer to right buttocks and ischium with NS and pat gently dry.  Apply Allevyn silicone dressing.  Change every 3 days and PRN soilage.  Turn and reposition every 2 hours to offload pressure.  Will not follow at this time.  Please re-consult if needed.  Domenic Moras RN BSN [place] Pager [phone]

## 2014-10-27 NOTE — Progress Notes (Signed)
[facility]    Subjective:  Complaining of light bothering his eyes And headaches   Antibiotics:  Anti-infectives    Start     Dose/Rate Route Frequency Ordered Stop   10/27/14 1000  ceFEPIme (MAXIPIME) 1 g in dextrose 5 % 50 mL IVPB     1 g 100 mL/hr over 30 Minutes Intravenous Every 12 hours 10/27/14 0740     10/26/14 2200  ceFEPIme (MAXIPIME) 1 g in dextrose 5 % 50 mL IVPB  Status:  Discontinued     1 g 100 mL/hr over 30 Minutes Intravenous Every 24 hours 10/26/14 0157 10/27/14 0740   10/25/14 2230  linezolid (ZYVOX) IVPB 600 mg     600 mg 300 mL/hr over 60 Minutes Intravenous Every 12 hours 10/25/14 2215     10/25/14 2230  ceFEPIme (MAXIPIME) 2 g in dextrose 5 % 50 mL IVPB    Comments:  VP shunt infection   2 g 100 mL/hr over 30 Minutes Intravenous  Once 10/25/14 2216 10/26/14 0256   10/25/14 2145  piperacillin-tazobactam (ZOSYN) IVPB 3.375 g     3.375 g 100 mL/hr over 30 Minutes Intravenous  Once 10/25/14 2142 10/25/14 2222      Medications: Scheduled Meds: . ceFEPime (MAXIPIME) IV  1 g Intravenous Q12H  . heparin  5,000 Units Subcutaneous 3 times per day  . insulin aspart  0-5 Units Subcutaneous QHS  . insulin aspart  0-9 Units Subcutaneous TID WC  . linezolid  600 mg Intravenous Q12H   Continuous Infusions: . sodium chloride 125 mL/hr at 10/27/14 1358   PRN Meds:.sodium chloride, acetaminophen, morphine injection, ondansetron (ZOFRAN) IV    Objective: Weight change: 5 lb 8.2 oz (2.5 kg)  Intake/Output Summary (Last 24 hours) at 10/27/14 1507 Last data filed at 10/27/14 1251  Gross per 24 hour  Intake   5828 ml  Output   4885 ml  Net    943 ml   Blood pressure 109/46, pulse 103, temperature 97.5 F (36.4 C), temperature source Oral, resp. rate 18, height 5' 2.99" (1.6 m), weight 166 lb 10.7 oz (75.6 kg), SpO2 100 %. Temp:  [97.1 F (36.2 C)-98.1 F (36.7 C)] 97.5 F (36.4 C) (02/15 1203) Pulse Rate:  [59-107] 103  (02/15 1203) Resp:  [11-22] 18 (02/15 1203) BP: (83-109)/(37-81) 109/46 mmHg (02/15 1203) SpO2:  [99 %-100 %] 100 % (02/15 1203) Weight:  [166 lb 10.7 oz (75.6 kg)] 166 lb 10.7 oz (75.6 kg) (02/15 0500)  Physical Exam: General: was wearing a mask because the light is bothering his eyes. Is arousable and can answer some questions though mother chimes in quite frequently HEENT: anicteric sclera, OMI, oropharynx clear and without exudate CVS tachycardic rate, normal r, no murmur rubs or gallops Chest: clear to auscultation bilaterally, no wheezing, rales or rhonchi Abdomen: soft nontender, nondistended, normal bowel sounds, Skin : I examined his sacral areas yesterday. Neuro: nonfocal  CBC:  CBC Latest Ref Rng 10/27/2014 10/26/2014 10/25/2014  WBC 4.0 - 10.5 K/uL 6.4 13.3(H) 27.6(H)  Hemoglobin 13.0 - 17.0 g/dL 9.1(L) 10.9(L) 15.7  Hematocrit 39.0 - 52.0 % 26.4(L) 31.3(L) 43.8  Platelets 150 - 400 K/uL 210 235 333       BMET  Recent Labs  10/26/14 1617 10/27/14 0415  NA 140 141  K 2.6* 2.7*  CL 108 113*  CO2 20 22  GLUCOSE 117* 92  BUN 67* 37*  CREATININE 1.75* 0.76  CALCIUM 8.0* 7.6*  Liver Panel   Recent Labs  10/25/14 2004  PROT 10.2*  ALBUMIN 4.7  AST 49*  ALT 41  ALKPHOS 129*  BILITOT 1.4*       Sedimentation Rate No results for input(s): ESRSEDRATE in the last 72 hours. C-Reactive Protein No results for input(s): CRP in the last 72 hours.  Micro Results: Recent Results (from the past 720 hour(s))  Blood culture (routine x 2)     Status: None (Preliminary result)   Collection Time: 10/25/14  9:00 PM  Result Value Ref Range Status   Specimen Description BLOOD RIGHT ANTECUBITAL  Final   Special Requests BOTTLES DRAWN AEROBIC ONLY 5CC  Final   Culture   Final           BLOOD CULTURE RECEIVED NO GROWTH TO DATE CULTURE WILL BE HELD FOR 5 DAYS BEFORE ISSUING A FINAL NEGATIVE REPORT Performed at [facility]    Report Status PENDING   Incomplete  Blood culture (routine x 2)     Status: None (Preliminary result)   Collection Time: 10/25/14  9:11 PM  Result Value Ref Range Status   Specimen Description BLOOD RIGHT ANTECUBITAL  Final   Special Requests BOTTLES DRAWN AEROBIC ONLY 5CC  Final   Culture   Final           BLOOD CULTURE RECEIVED NO GROWTH TO DATE CULTURE WILL BE HELD FOR 5 DAYS BEFORE ISSUING A FINAL NEGATIVE REPORT Performed at [facility]    Report Status PENDING  Incomplete  Urine culture     Status: None (Preliminary result)   Collection Time: 10/25/14  9:41 PM  Result Value Ref Range Status   Specimen Description URINE, CATHETERIZED  Final   Special Requests NONE  Final   Colony Count PENDING  Incomplete   Culture   Final    Culture reincubated for better growth Performed at [facility]    Report Status PENDING  Incomplete  MRSA PCR Screening     Status: None   Collection Time: 10/26/14  1:39 AM  Result Value Ref Range Status   MRSA by PCR NEGATIVE NEGATIVE Final    Comment:        The GeneXpert MRSA Assay (FDA approved for NASAL specimens only), is one component of a comprehensive MRSA colonization surveillance program. It is not intended to diagnose MRSA infection nor to guide or monitor treatment for MRSA infections.   Clostridium Difficile by PCR     Status: None   Collection Time: 10/26/14  4:04 AM  Result Value Ref Range Status   C difficile by pcr NEGATIVE NEGATIVE Final    Studies/Results: Dg Cervical Spine 2-3 Views  10/25/2014   CLINICAL DATA:  34 year old male VP shunt. Headache. Initial encounter.  EXAM: CERVICAL SPINE - 2-3 VIEW  COMPARISON:  Chest CTA 07/08/2013.  FINDINGS: Large body habitus. Shunt tubing tracks inferiorly in the right neck can't into the chest. There is a blind ending component of tubing suspected. Upper thoracic spinal rods. No acute osseous abnormality identified.  IMPRESSION: Two shunt catheters course inferiorly from the right scalp, 1  appears blind ending. The second continues into the chest. Large body habitus.   Electronically Signed   By: Genevie Ann M.D.   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 Hospital admission for sepsis.  EXAM: CT HEAD WITHOUT CONTRAST  TECHNIQUE: Contiguous axial  images were obtained from the base of the skull through the vertex without intravenous contrast.  COMPARISON:  CT of the head October 25, 2014  FINDINGS: Ventriculoperitoneal shunt via RIGHT parietal burr hole, distal tip in the frontal horn of LEFT lateral ventricle. Again noted is irregular configuration of the ventricles with steer horn appearance consistent with agenesis of the corpus callosum. Patchy periventricular white matter hypodensities, unchanged. Poor visualization of the sulci, which may in part reflect sequelae of hydrocephalus and congenital in etiology, motion. No intraparenchymal hemorrhage, midline shift. No acute large vascular territory infarct. Featureless posterior fossa with cerebellar tonsillar descent below the foramen magnum consistent with provided history.  Mild paranasal sinus mucosal thickening without air-fluid levels. The mastoid air cells are well aerated.  IMPRESSION: Stable appearance of ventriculoperitoneal shunt with distal tip at LEFT frontal horn of lateral ventricle. Confluent periventricular RIGHT matter hypodensities may reflect interstitial edema, recommend correlation with shunt pressures.  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   Electronically Signed   By: Elon Alas   On: 10/27/2014 05:28   Ct Head Wo Contrast  10/25/2014   CLINICAL DATA:  34 year old male with VP shunt and headache. Initial encounter.  EXAM: CT HEAD WITHOUT CONTRAST  TECHNIQUE: Contiguous axial images were obtained from the base of the skull through the vertex without intravenous contrast.   COMPARISON:  Report of the head CT 02/21/2001 (no images available).  FINDINGS: Visualized paranasal sinuses and mastoids are clear. Right posterior approach shunt catheter with burr hole. Hyperostosis of the calvarium. No acute osseous abnormality identified. Visualized orbit soft tissues are within normal limits.  Right posterior scalp shunt reservoir. Two separate shunt catheters continue inferiorly.  The internal component of the ventriculostomy shunt terminates at the left frontal horn. Dysplastic ventricles. Ventriculomegaly including enlarged temporal horns. Mild periventricular hypodensity compatible with transependymal edema. Effaced basilar cisterns including the cisterna magna. No acute intracranial hemorrhage identified. No extra-axial collection identified. No evidence of cortically based acute infarction identified.  IMPRESSION: 1. Dysplastic ventricles with evidence of acute ventriculomegaly with some transependymal edema. 2. Effaced basilar cisterns including the cisterna magna. 3. Right posterior approach shunt catheter, tip at the left frontal horn.  Study discussed by telephone with Dr. Shirlyn Goltz on 10/25/2014 at 21:01.   Electronically Signed   By: Genevie Ann M.D.   On: 10/25/2014 21:04   Dg Abd Acute W/chest  10/25/2014   CLINICAL DATA:  Ventriculoperitoneal shunt present. Headache for 1 week. Assessing ventriculoperitoneal shunt location  EXAM: ACUTE ABDOMEN SERIES (ABDOMEN 2 VIEW & CHEST 1 VIEW)  COMPARISON:  None.  FINDINGS: PA chest: Ventriculoperitoneal shunt is seen along the right hemithorax. There appears to be coiling of the shunt in the right neck region. Lungs are clear. Heart size is within normal limits. Pulmonary vascularity is normal. There is extensive postoperative change in the thoracic spine with extensive spinal dysraphism present.  Supine and left lateral decubitus abdomen: The shunt catheter tip is seen on the decubitus examination. Tip is just below the diaphragm in the  midline of the right abdomen. The overall bowel gas pattern is unremarkable. No obstruction or free air. Postoperative change in the lumbar spine with spinal dysraphism noted.  IMPRESSION: Shunt catheter is at a level just below the diaphragm in the midline of the abdomen. It is not possible to ascertain that the catheter is in the peritoneum versus in the soft tissues overlying the intra-abdominal contents. Bowel gas pattern unremarkable. Postoperative change in the  spine region. Note that there is swelling of a portion of the shunt catheter in the neck region on the right. No lung edema or consolidation.   Electronically Signed   By: Lowella Grip III M.D.   On: 10/25/2014 21:11      Assessment/Plan:  Active Problems:   Spina bifida   Septic shock   Protein-calorie malnutrition, severe   Obstructed VP shunt   Severe sepsis   Renal failure (ARF), acute on chronic   Severe sepsis with septic shock   UTI (lower urinary tract infection)    [name] is a 34 y.o. male with  VP shunt admitted with sepsis Urine culture is likely growing Enterobacteriaceae mixed with urogenital flora per lab. Blood cultures are negative.  #1 Sepsis: multiple possible sources but I still have concern about the VP shunt.  I will dc zyvox since only culture that is + so far is urine with GNR  #2  Rule out VP shunt infection: Dr. Jenny Reichmann to still not impressed for shunt infection and feels the risks of tapping the shunt outweigh the benefits and would prefer to continue to observe him. I would note that in talking to the patient and his mother that his headaches and photophobia are new and would worry me for an acute complication of his shunt.  Dr. Megan Salon taking over the service tomorrow.   LOS: 2 days   Alcide Evener 10/27/2014, 3:07 PM

## 2014-10-27 NOTE — Clinical Documentation Improvement (Signed)
Current documentation notes "Renal failure (ARF), acute on chronic".  Please specify stage of CKD if known in your progress note and carry over to the discharge summary.    Possible Clinical Conditions: . Document the stage of CKD --Chronic kidney disease, stage 1- GFR > OR = 90 --Chronic kidney disease, stage 2 (mild) - GFR 60-89 --Chronic kidney disease, stage 3 (moderate) - GFR 30-59 --Chronic kidney disease, stage 4 (severe) - GFR 15-29   Thank you, Mateo Flow, RN [phone] Clinical Documentation Specialist

## 2014-10-28 DIAGNOSIS — IMO0002 Reserved for concepts with insufficient information to code with codable children: Secondary | ICD-10-CM | POA: Insufficient documentation

## 2014-10-28 DIAGNOSIS — Z982 Presence of cerebrospinal fluid drainage device: Secondary | ICD-10-CM

## 2014-10-28 DIAGNOSIS — Z9889 Other specified postprocedural states: Secondary | ICD-10-CM | POA: Insufficient documentation

## 2014-10-28 LAB — GI PATHOGEN PANEL BY PCR, STOOL
C difficile toxin A/B: NOT DETECTED
Campylobacter by PCR: NOT DETECTED
Cryptosporidium by PCR: NOT DETECTED
E COLI (ETEC) LT/ST: NOT DETECTED
E COLI (STEC): NOT DETECTED
E coli 0157 by PCR: NOT DETECTED
G lamblia by PCR: NOT DETECTED
NOROVIRUS G1/G2: NOT DETECTED
Rotavirus A by PCR: NOT DETECTED
SALMONELLA BY PCR: NOT DETECTED
SHIGELLA BY PCR: NOT DETECTED

## 2014-10-28 LAB — BASIC METABOLIC PANEL
Anion gap: 7 (ref 5–15)
Anion gap: 16 — ABNORMAL HIGH (ref 5–15)
BUN: 11 mg/dL (ref 6–23)
BUN: 7 mg/dL (ref 6–23)
CO2: 21 mmol/L (ref 19–32)
CO2: 17 mmol/L — ABNORMAL LOW (ref 19–32)
Calcium: 7.9 mg/dL — ABNORMAL LOW (ref 8.4–10.5)
Calcium: 8.4 mg/dL (ref 8.4–10.5)
Chloride: 113 mmol/L — ABNORMAL HIGH (ref 96–112)
Chloride: 109 mmol/L (ref 96–112)
Creatinine, Ser: 0.54 mg/dL (ref 0.50–1.35)
Creatinine, Ser: 0.64 mg/dL (ref 0.50–1.35)
GFR calc Af Amer: >90 mL/min (ref >90)
GFR calc non Af Amer: >90 mL/min (ref >90)
GLUCOSE: 126 mg/dL — AB (ref 70–99)
Glucose, Bld: 80 mg/dL (ref 70–99)
POTASSIUM: 3.1 mmol/L — AB (ref 3.5–5.1)
Potassium: 2.7 mmol/L — CL (ref 3.5–5.1)
SODIUM: 142 mmol/L (ref 135–145)
Sodium: 141 mmol/L (ref 135–145)

## 2014-10-28 LAB — URINE CULTURE: Colony Count: >=100000

## 2014-10-28 LAB — GLUCOSE, CAPILLARY
GLUCOSE-CAPILLARY: 122 mg/dL — AB (ref 70–99)
GLUCOSE-CAPILLARY: 128 mg/dL — AB (ref 70–99)
GLUCOSE-CAPILLARY: 78 mg/dL (ref 70–99)
GLUCOSE-CAPILLARY: 88 mg/dL (ref 70–99)

## 2014-10-28 LAB — MAGNESIUM: Magnesium: 1.2 mg/dL — ABNORMAL LOW (ref 1.5–2.5)

## 2014-10-28 LAB — OVA AND PARASITE EXAMINATION: OVA AND PARASITES: NONE SEEN

## 2014-10-28 LAB — PHOSPHORUS: Phosphorus: 1 mg/dL — CL (ref 2.3–4.6)

## 2014-10-28 LAB — HEPATITIS C ANTIBODY (REFLEX): HCV Ab: NEGATIVE

## 2014-10-28 MED ORDER — POTASSIUM CHLORIDE CRYS ER 20 MEQ PO TBCR
40.0000 meq | EXTENDED_RELEASE_TABLET | Freq: Once | ORAL | Status: AC
  Administered 2014-10-28: 40 meq via ORAL
  Filled 2014-10-28: qty 2

## 2014-10-28 MED ORDER — POTASSIUM PHOSPHATES 15 MMOLE/5ML IV SOLN
20.0000 mmol | Freq: Once | INTRAVENOUS | Status: AC
  Administered 2014-10-28: 20 mmol via INTRAVENOUS
  Filled 2014-10-28: qty 6.67

## 2014-10-28 MED ORDER — ENSURE COMPLETE PO LIQD
237.0000 mL | Freq: Two times a day (BID) | ORAL | Status: DC
Start: 2014-10-28 — End: 2014-10-30
  Administered 2014-10-28 – 2014-10-30 (×4): 237 mL via ORAL

## 2014-10-28 MED ORDER — POTASSIUM CHLORIDE 20 MEQ PO PACK
40.0000 meq | PACK | Freq: Once | ORAL | Status: DC
  Filled 2014-10-28: qty 2

## 2014-10-28 MED ORDER — MAGNESIUM SULFATE IN D5W 10-5 MG/ML-% IV SOLN
1.0000 g | Freq: Once | INTRAVENOUS | Status: AC
  Administered 2014-10-28: 1 g via INTRAVENOUS
  Filled 2014-10-28: qty 100

## 2014-10-28 NOTE — Progress Notes (Signed)
Patient ID: [name], male   DOB: Apr 13, 1981, 34 y.o.   MRN: [id number]  [facility] PROGRESS NOTE  [name] VQQ:[id number] DOB: 1980-11-18 DOA: 10/25/2014 PCP: Elyn Peers, MD   Brief narrative:    [name] is a 34 y/o man w/ Spina Bifida, paraplegia, complex surgical history following a chole with a bowel perforation in October 4332 complicated by ARDS presented with metabolic disarray and altered mental status. He has had frequent UTIs in the past and has presented to [facility] in October of 2014 and had a cholecystectomy which was complicated by perforation of bowel requiring multiple surgical revisions/washouts done at [facility]. He does also have a history of hydrocephalous with VP shunt. When he had his complex abdominal surgical situation, he did have his drain externalized, and his mother reports that the catheter was ultimately placed in his chest, in what sounds like is his pleural space. Ten days prior to admission, the patient had a "gi bug" that his mother had as well, with dehydration, nausea, and poor PO intake. The patient had persistent watery output from his ostomy, which is usual for him. A few days prior to admission, he became more confused, and was "not himself" on the day of admission, so family presented to [facility] ED. Given need for NSGY consult, he was admitted to [facility] to PCCM.  SIGNIFICANT EVENTS / STUDIES:  CT head / shunt series 11/25/22 > acute ventriculomegaly 2/16 - care transferred to [facility] from PCCM   Assessment/Plan:    Acute encephalopathy - hydrocephalous, ? UTI, unlikely shunt infection due to fairly quick improvement in mental status on 4 day of ABX - continue Cefepime day #4 Severe sepsis, septic shock  - likely secondary to E. Coli UTI  - pt clinically stable, VSS stable, no fever over the past 24 ours, WBC is WNL - continue ABX per ID recommendations: cefepime day #4, follow up on final urine culture to determine transition to appropriate ABX, plan  to treat for total of 14 days  - appreciate ID input  Acute renal failure due to severe dehydration - pre renal etiology - pt clinically stable this AM, good urine output  - Cr is now WNL Hyponatremia - IVF provided and responding well, Na is now WNL  Hypokalemia with hypomagnesemia and hypophosphatemia  - continue to supplement and repeat BMP in AM, check Mg and phosp levels as well  Gap metabolic acidosis - Likely NAGMA, continuing IVF and supplementing electrolytes  Colitis / enteritis - c diff neg - improving overall  Leukocytosis - Due to sepsis, WBC WNL this AM Drop in Hg since admission - possibly dilutional from IVF, no signs of active bleeding - needs to be monitored closely as H6 at baseline ~12 in the recent past  - CBC in AM DM2 - Monitor CBGs DVT prophylaxis - Heparin SQ   Code Status: Full.  Family Communication:  plan of care discussed with family at bedside  Disposition Plan: Not ready for d/c at this time   Lines/Tubes:   PIV Ostomy Condom-cath foley  Procedures and diagnostic studies:     Dg Cervical Spine 2-3 Views  10/25/2014   wo shunt catheters course inferiorly from the right scalp, 1 appears blind ending. The second continues into the chest. Large body habitus.    Ct Head Wo Contrast   10/27/2014  Stable appearance of ventriculoperitoneal shunt with distal tip at LEFT frontal horn of lateral ventricle. Confluent periventricular RIGHT matter hypodensities may reflect interstitial  edema, recommend correlation with shunt pressures.  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     Ct Head Wo Contrast  10/25/2014   Dysplastic ventricles with evidence of acute ventriculomegaly with some transependymal edema. 2. Effaced basilar cisterns including the cisterna magna. 3. Right posterior approach shunt catheter, tip at the left frontal horn.    Dg Abd Acute W/chest  10/25/2014  Shunt catheter is at a level just below the diaphragm in the  midline of the abdomen. It is not possible to ascertain that the catheter is in the peritoneum versus in the soft tissues overlying the intra-abdominal contents. Bowel gas pattern unremarkable. Postoperative change in the spine region. Note that there is swelling of a portion of the shunt catheter in the neck region on the right. No lung edema or consolidation.     Medical Consultants:   Neurosurgery ID PCCM initially attending team, care transferred to [facility] 10/28/2014  Other Consultants:   None  IAnti-Infectives:    Cefepime 2/13 -->   Faye Ramsay, MD  [facility] Pager [phone]  If 7PM-7AM, please contact night-coverage [email or web address] Password [facility] 10/28/2014, 6:51 PM   LOS: 3 days   HPI/Subjective: No events overnight.   Objective: Filed Vitals:   10/27/14 2022 10/28/14 0538 10/28/14 1000 10/28/14 1800  BP: 108/28 98/51 101/50 131/60  Pulse: 101 101 102 110  Temp: 98.8 F (37.1 C) 98.7 F (37.1 C) 98 F (36.7 C) 98.2 F (36.8 C)  TempSrc: Core (Comment) Axillary Oral Oral  Resp: 18 20 18 18   Height:      Weight: 80.65 kg (177 lb 12.8 oz)     SpO2: 100% 99% 99% 98%    Intake/Output Summary (Last 24 hours) at 10/28/14 1851 Last data filed at 10/28/14 1800  Gross per 24 hour  Intake 4022.92 ml  Output   6075 ml  Net -2052.08 ml    Exam:   General:  Pt is alert, follows commands appropriately, not in acute distress  Cardiovascular: Regular rate and rhythm, S1/S2, no murmurs, no rubs, no gallops  Respiratory: Clear to auscultation bilaterally, no wheezing, diminished breath sounds at bases   Abdomen: Soft, non tender, non distended, bowel sounds present, no guarding  Extremities: pulses DP and PT palpable bilaterally  Data Reviewed: Basic Metabolic Panel:  Recent Labs Lab 10/26/14 1617 10/27/14 0415 10/27/14 1725 10/28/14 0457 10/28/14 1540  NA 140 141 138 141 142  K 2.6* 2.7* 3.3* 3.1* 2.7*  CL 108 113* 114* 113* 109  CO2 20 22 20 21  17*   GLUCOSE 117* 92 93 80 126*  BUN 67* 37* 19 11 7   CREATININE 1.75* 0.76 0.69 0.54 0.64  CALCIUM 8.0* 7.6* 7.6* 7.9* 8.4  MG  --   --   --  1.2*  --   PHOS  --   --   --  1.0*  --    Liver Function Tests:  Recent Labs Lab 10/25/14 2004  AST 49*  ALT 41  ALKPHOS 129*  BILITOT 1.4*  PROT 10.2*  ALBUMIN 4.7   CBC:  Recent Labs Lab 10/25/14 2004 10/26/14 0326 10/27/14 0415  WBC 27.6* 13.3* 6.4  NEUTROABS 25.4*  --   --   HGB 15.7 10.9* 9.1*  HCT 43.8 31.3* 26.4*  MCV 74.2* 73.6* 73.3*  PLT 333 235 210   CBG:  Recent Labs Lab 10/27/14 2019 10/28/14 0013 10/28/14 0745 10/28/14 1146 10/28/14 1657  GLUCAP 87 78 88 128* 122*  Recent Results (from the past 240 hour(s))  Blood culture (routine x 2)     Status: None (Preliminary result)   Collection Time: 10/25/14  9:00 PM  Result Value Ref Range Status   Specimen Description BLOOD RIGHT ANTECUBITAL  Final   Special Requests BOTTLES DRAWN AEROBIC ONLY 5CC  Final   Culture   Final           BLOOD CULTURE RECEIVED NO GROWTH TO DATE CULTURE WILL BE HELD FOR 5 DAYS BEFORE ISSUING A FINAL NEGATIVE REPORT Performed at [facility]    Report Status PENDING  Incomplete  Blood culture (routine x 2)     Status: None (Preliminary result)   Collection Time: 10/25/14  9:11 PM  Result Value Ref Range Status   Specimen Description BLOOD RIGHT ANTECUBITAL  Final   Special Requests BOTTLES DRAWN AEROBIC ONLY 5CC  Final   Culture   Final           BLOOD CULTURE RECEIVED NO GROWTH TO DATE CULTURE WILL BE HELD FOR 5 DAYS BEFORE ISSUING A FINAL NEGATIVE REPORT Performed at [facility]    Report Status PENDING  Incomplete  Urine culture     Status: None (Preliminary result)   Collection Time: 10/25/14  9:41 PM  Result Value Ref Range Status   Specimen Description URINE, CATHETERIZED  Final   Special Requests NONE  Final   Colony Count   Final    >=100,000 COLONIES/ML Performed at [facility]     Culture   Final    ESCHERICHIA COLI Performed at [facility]    Report Status PENDING  Incomplete  MRSA PCR Screening     Status: None   Collection Time: 10/26/14  1:39 AM  Result Value Ref Range Status   MRSA by PCR NEGATIVE NEGATIVE Final    Comment:        The GeneXpert MRSA Assay (FDA approved for NASAL specimens only), is one component of a comprehensive MRSA colonization surveillance program. It is not intended to diagnose MRSA infection nor to guide or monitor treatment for MRSA infections.   Ova and parasite examination     Status: None   Collection Time: 10/26/14  4:04 AM  Result Value Ref Range Status   Specimen Description STOOL  Final   Special Requests NONE  Final   Ova and parasites   Final    NO OVA OR PARASITES SEEN Performed at [facility]    Report Status 10/28/2014 FINAL  Final  Clostridium Difficile by PCR     Status: None   Collection Time: 10/26/14  4:04 AM  Result Value Ref Range Status   C difficile by pcr NEGATIVE NEGATIVE Final     Scheduled Meds: . ceFEPime (MAXIPIME) IV  1 g Intravenous Q12H  . feeding supplement (ENSURE COMPLETE)  237 mL Oral BID BM  . heparin  5,000 Units Subcutaneous 3 times per day  . insulin aspart  0-5 Units Subcutaneous QHS  . insulin aspart  0-9 Units Subcutaneous TID WC   Continuous Infusions: . sodium chloride 125 mL/hr at 10/28/14 1755

## 2014-10-28 NOTE — Progress Notes (Signed)
Patient ID: [name], male   DOB: 07/18/1981, 34 y.o.   MRN: [id number]         [facility] for Infectious Disease    Date of Admission:  10/25/2014   Total days of antibiotics 4          Active Problems:   Spina bifida   Septic shock   Protein-calorie malnutrition, severe   Obstructed VP shunt   Severe sepsis   Renal failure (ARF), acute on chronic   Severe sepsis with septic shock   UTI (lower urinary tract infection)   . ceFEPime (MAXIPIME) IV  1 g Intravenous Q12H  . heparin  5,000 Units Subcutaneous 3 times per day  . insulin aspart  0-5 Units Subcutaneous QHS  . insulin aspart  0-9 Units Subcutaneous TID WC  . potassium phosphate IVPB (mmol)  20 mmol Intravenous Once    Subjective: He is feeling much better. He is no longer having any headache. His mother states that he is looking much better as well. His appetite is improved and he is eating well. She notes that she saw his urine when he was admitted and it was extremely cloudy.  Review of Systems: Pertinent items are noted in HPI.  Past Medical History  Diagnosis Date  . Hypertension   . GERD (gastroesophageal reflux disease)   . Non-healing surgical wound     ABDOMINE  . 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 Arnold-Chiari malformation   . Neurogenic bladder     INCONTINENT -  . 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 Ileostomy in place   . Wheelchair bound     CAN TRASFER SELF WITHOUT BOARD  . Urinary incontinence with continuous leakage   . Wears glasses   . Pressure ulcer, buttock   . Borderline diabetes     DIET CONTROLLED  . Heart palpitations     SECONDARY TO VP SHUNT DRAIN  . History of seizure as newborn     NO MEDS SINCE 34 YRS OLD--  NO ISSUES SINCE  . Neurogenic bowel   . At risk for sleep apnea     STOP-BANG = 4   SENT TO PCP 11-01-2013    History  Substance Use Topics  .  Smoking status: Never Smoker   . Smokeless tobacco: Never Used  . Alcohol Use: No    History reviewed. No pertinent family history. Allergies  Allergen Reactions  . Latex Hives  . Adhesive [Tape] Hives  . Ciprofloxacin Other (See Comments)    IV only-- caused burning in arm   . Vancomycin Itching and Swelling    OBJECTIVE: Blood pressure 101/50, pulse 102, temperature 98 F (36.7 C), temperature source Oral, resp. rate 18, height 5' 2.99" (1.6 m), weight 177 lb 12.8 oz (80.65 kg), SpO2 99 %. General: He is alert and comfortable. Skin: No rash Lungs: Clear Cor: Regular S1 and S2 with no murmurs Abdomen: Soft and nontender Neuro: Neck supple. Normal speech and conversation  Lab Results Lab Results  Component Value Date   WBC 6.4 10/27/2014   HGB 9.1* 10/27/2014   HCT 26.4* 10/27/2014   MCV 73.3* 10/27/2014   PLT 210 10/27/2014    Lab Results  Component Value Date   CREATININE 0.54 10/28/2014   BUN 11 10/28/2014   NA 141 10/28/2014   K 3.1* 10/28/2014  CL 113* 10/28/2014   CO2 21 10/28/2014    Lab Results  Component Value Date   ALT 41 10/25/2014   AST 49* 10/25/2014   ALKPHOS 129* 10/25/2014   BILITOT 1.4* 10/25/2014     Microbiology: Recent Results (from the past 240 hour(s))  Blood culture (routine x 2)     Status: None (Preliminary result)   Collection Time: 10/25/14  9:00 PM  Result Value Ref Range Status   Specimen Description BLOOD RIGHT ANTECUBITAL  Final   Special Requests BOTTLES DRAWN AEROBIC ONLY 5CC  Final   Culture   Final           BLOOD CULTURE RECEIVED NO GROWTH TO DATE CULTURE WILL BE HELD FOR 5 DAYS BEFORE ISSUING A FINAL NEGATIVE REPORT Performed at [facility]    Report Status PENDING  Incomplete  Blood culture (routine x 2)     Status: None (Preliminary result)   Collection Time: 10/25/14  9:11 PM  Result Value Ref Range Status   Specimen Description BLOOD RIGHT ANTECUBITAL  Final   Special Requests BOTTLES DRAWN AEROBIC  ONLY 5CC  Final   Culture   Final           BLOOD CULTURE RECEIVED NO GROWTH TO DATE CULTURE WILL BE HELD FOR 5 DAYS BEFORE ISSUING A FINAL NEGATIVE REPORT Performed at [facility]    Report Status PENDING  Incomplete  Urine culture     Status: None (Preliminary result)   Collection Time: 10/25/14  9:41 PM  Result Value Ref Range Status   Specimen Description URINE, CATHETERIZED  Final   Special Requests NONE  Final   Colony Count   Final    >=100,000 COLONIES/ML Performed at [facility]    Culture   Final    ESCHERICHIA COLI Performed at [facility]    Report Status PENDING  Incomplete  MRSA PCR Screening     Status: None   Collection Time: 10/26/14  1:39 AM  Result Value Ref Range Status   MRSA by PCR NEGATIVE NEGATIVE Final    Comment:        The GeneXpert MRSA Assay (FDA approved for NASAL specimens only), is one component of a comprehensive MRSA colonization surveillance program. It is not intended to diagnose MRSA infection nor to guide or monitor treatment for MRSA infections.   Clostridium Difficile by PCR     Status: None   Collection Time: 10/26/14  4:04 AM  Result Value Ref Range Status   C difficile by pcr NEGATIVE NEGATIVE Final    Assessment: I suspect that his acute illness is due to an Escherichia coli UTI. I would not expect a ventriculoatrial shunt infection to improve this much with 4 days of empiric antibiotic therapy.  Plan: 1. Continue cefepime pending final urine culture and susceptibilities. Hopefully will have options for conversion to oral antibiotic therapy soon and treat for a total of 14 days.  Michel Bickers, MD [facility] [phone] pager   [phone] cell 10/28/2014, 12:14 PM

## 2014-10-28 NOTE — Progress Notes (Addendum)
CRITICAL VALUE ALERT  Critical value received:  Phosphorus = 1.0   Date of notification:  10/28/14  Time of notification:  0605  Critical value read back:Yes.    Nurse who received alert:  K. Laurance Flatten   MD notified (1st page):  Tylene Fantasia  Time of first page:  0607  New orders to epic for potassium phosphate and magnesium sulfate per Tylene Fantasia.

## 2014-10-28 NOTE — Progress Notes (Signed)
INITIAL NUTRITION ASSESSMENT  DOCUMENTATION CODES Per approved criteria  -Obesity Unspecified   INTERVENTION: Provide Ensure Complete po BID, each supplement provides 350 kcal and 13 grams of protein.  NUTRITION DIAGNOSIS: Increased nutrient needs related to pressure ulcers as evidenced by estimated nutrition needs.   Goal: Pt to meet >/= 90% of their estimated nutrition needs   Monitor:  PO intake, weight trends, labs, I/O's  Reason for Assessment: Low Braden Score  34 y.o. male  Admitting Dx: Septic shock  ASSESSMENT: Pt w/ Spina Bifida, paraplegia, complex surgical history presenting with fevers, renal failure, and hydrocephelaous.   Mom present at pt bedside. She reports pt usually has a great appetite eating 3 meals a day along with Ensure once daily, until the 2 days PTA where she reports pt had lost his appetite and did not eat well. Currently pt has been regaining his appetite with 75-100% meal completion. Weight has been stable. Noted pt with multiple pressure ulcers. Mom reports she would like Ensure ordered for him to aid in wound healing. RD to order.  Pt with no observed significant fat or muscle mass loss. Lower extremities were N/A due to paraplegia.   Labs: Low potassium, phosphorous, magnesium, calcium. High chloride.   Height: Ht Readings from Last 1 Encounters:  10/26/14 5' 2.99" (1.6 m)    Weight: Wt Readings from Last 1 Encounters:  10/27/14 177 lb 12.8 oz (80.65 kg)    Ideal Body Weight: 111-118 lbs --adjusted for paraplegia  % Ideal Body Weight: 150-159%  Wt Readings from Last 10 Encounters:  10/27/14 177 lb 12.8 oz (80.65 kg)  11/04/13 166 lb (75.297 kg)  07/08/13 230 lb 2.6 oz (104.4 kg)    Usual Body Weight: 177%  % Usual Body Weight: 100%  BMI:  Body mass index is 31.5 kg/(m^2). Class I obesity  Estimated Nutritional Needs: Kcal: 2000-2200 Protein: 100-110 grams Fluid:  2 - 2.2 L/day  Skin: stage II pressure ulcer on  buttocks, stage III pressure ulcer on sacrum, +1 UE edema  Diet Order: Diet regular  EDUCATION NEEDS: -No education needs identified at this time   Intake/Output Summary (Last 24 hours) at 10/28/14 1016 Last data filed at 10/28/14 0928  Gross per 24 hour  Intake 1349.58 ml  Output   5265 ml  Net -3915.42 ml    Last BM: ileostomy   Labs:   Recent Labs Lab 10/27/14 0415 10/27/14 1725 10/28/14 0457  NA 141 138 141  K 2.7* 3.3* 3.1*  CL 113* 114* 113*  CO2 22 20 21   BUN 37* 19 11  CREATININE 0.76 0.69 0.54  CALCIUM 7.6* 7.6* 7.9*  MG  --   --  1.2*  PHOS  --   --  1.0*  GLUCOSE 92 93 80    CBG (last 3)   Recent Labs  10/27/14 2019 10/28/14 0013 10/28/14 0745  GLUCAP 87 78 88    Scheduled Meds: . ceFEPime (MAXIPIME) IV  1 g Intravenous Q12H  . heparin  5,000 Units Subcutaneous 3 times per day  . insulin aspart  0-5 Units Subcutaneous QHS  . insulin aspart  0-9 Units Subcutaneous TID WC  . potassium phosphate IVPB (mmol)  20 mmol Intravenous Once    Continuous Infusions: . sodium chloride 125 mL/hr at 10/27/14 2243    Past Medical History  Diagnosis Date  . Hypertension   . GERD (gastroesophageal reflux disease)   . Non-healing surgical wound     ABDOMINE  . 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 Arnold-Chiari malformation   . Neurogenic bladder     INCONTINENT -  . 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 Ileostomy in place   . Wheelchair bound     CAN TRASFER SELF WITHOUT BOARD  . Urinary incontinence with continuous leakage   . Wears glasses   . Pressure ulcer, buttock   . Borderline diabetes     DIET CONTROLLED  . Heart palpitations     SECONDARY TO VP SHUNT DRAIN  . History of seizure as newborn     NO MEDS SINCE 34 YRS OLD--  NO ISSUES SINCE  . Neurogenic bowel   . At risk for sleep apnea     STOP-BANG = 4   SENT TO PCP 11-01-2013     Past Surgical History  Procedure Laterality Date  . Ventriculoperitoneal shunt  LAST REVISION NOV 2014    DUE TO ABD WOUND--- SHUNT DRAINS TO HEART  . Hip surgery Bilateral AS CHILD    TENDON RELEASE  . Cholecystectomy N/A 07/06/2013    Procedure: LAPAROSCOPIC CHOLECYSTECTOMY WITH INTRAOPERATIVE CHOLANGIOGRAM;  Surgeon: Madilyn Hook, DO;  Location: [facility];  Service: General;  Laterality: N/A;  . Colon resection N/A 07/08/2013    Procedure: EXPLORATORY LAPAROSCOPY, DIAGNOSTIC LAPAROSCOPY, PARTIAL COLECTOMY, ABDOMINAL WASHOUT, ABDOMINAL WOUND VAC;  Surgeon: Madilyn Hook, DO;  Location: [facility];  Service: General;  Laterality: N/A;  . Spinal fixation surgery w/ implant  AS CHILD    [place] R[facility]  . 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 Umbilical hernia repair  03-30-2007  . Right colectomy/  appendectomy/  ileostomy  NOV 2014  [facility]  . Incision and drainage of wound N/A 11/04/2013    Procedure: IRRIGATION AND DEBRIDEMENT ABDOMINAL WOUND WITH PLACEMENT OF ACELL/VAC;  Surgeon: Theodoro Kos, DO;  Location: [facility];  Service: Plastics;  Laterality: N/A;    Kallie Locks, MS, RD, LDN Pager # [phone] After hours/ weekend pager # [phone]

## 2014-10-29 DIAGNOSIS — E875 Hyperkalemia: Secondary | ICD-10-CM

## 2014-10-29 DIAGNOSIS — R509 Fever, unspecified: Secondary | ICD-10-CM

## 2014-10-29 LAB — CBC
HEMATOCRIT: 28.7 % — AB (ref 39.0–52.0)
Hemoglobin: 9.8 g/dL — ABNORMAL LOW (ref 13.0–17.0)
MCH: 25.8 pg — ABNORMAL LOW (ref 26.0–34.0)
MCHC: 34.1 g/dL (ref 30.0–36.0)
MCV: 75.5 fL — AB (ref 78.0–100.0)
Platelets: 179 10*3/uL (ref 150–400)
RBC: 3.8 MIL/uL — AB (ref 4.22–5.81)
RDW: 13.9 % (ref 11.5–15.5)
WBC: 13.9 10*3/uL — AB (ref 4.0–10.5)

## 2014-10-29 LAB — GLUCOSE, CAPILLARY
GLUCOSE-CAPILLARY: 119 mg/dL — AB (ref 70–99)
GLUCOSE-CAPILLARY: 74 mg/dL (ref 70–99)
GLUCOSE-CAPILLARY: 91 mg/dL (ref 70–99)
Glucose-Capillary: 93 mg/dL (ref 70–99)
Glucose-Capillary: 113 mg/dL — ABNORMAL HIGH (ref 70–99)

## 2014-10-29 LAB — BASIC METABOLIC PANEL
Anion gap: 3 — ABNORMAL LOW (ref 5–15)
BUN: 6 mg/dL (ref 6–23)
CALCIUM: 8.3 mg/dL — AB (ref 8.4–10.5)
CO2: 24 mmol/L (ref 19–32)
Chloride: 109 mmol/L (ref 96–112)
Creatinine, Ser: 0.51 mg/dL (ref 0.50–1.35)
GFR calc Af Amer: >90 mL/min (ref >90)
GLUCOSE: 79 mg/dL (ref 70–99)
Potassium: 3.7 mmol/L (ref 3.5–5.1)
SODIUM: 136 mmol/L (ref 135–145)

## 2014-10-29 LAB — PHOSPHORUS: PHOSPHORUS: 2 mg/dL — AB (ref 2.3–4.6)

## 2014-10-29 LAB — HIV-1 RNA ULTRAQUANT REFLEX TO GENTYP+: HIV 1 RNA Quant: <20 copies/mL (ref <20)

## 2014-10-29 LAB — MAGNESIUM: MAGNESIUM: 1.2 mg/dL — AB (ref 1.5–2.5)

## 2014-10-29 MED ORDER — POTASSIUM CHLORIDE CRYS ER 20 MEQ PO TBCR
40.0000 meq | EXTENDED_RELEASE_TABLET | Freq: Once | ORAL | Status: AC
  Administered 2014-10-29: 40 meq via ORAL
  Filled 2014-10-29: qty 2

## 2014-10-29 MED ORDER — MAGNESIUM SULFATE 50 % IJ SOLN
3.0000 g | Freq: Once | INTRAVENOUS | Status: AC
  Administered 2014-10-29: 3 g via INTRAVENOUS
  Filled 2014-10-29: qty 6

## 2014-10-29 MED ORDER — CEPHALEXIN 500 MG PO CAPS
500.0000 mg | ORAL_CAPSULE | Freq: Four times a day (QID) | ORAL | Status: DC
  Administered 2014-10-29 – 2014-10-30 (×3): 500 mg via ORAL
  Filled 2014-10-29 (×7): qty 1

## 2014-10-29 NOTE — Progress Notes (Signed)
Patient ID: Leodis Sias, male   DOB: 1980-11-18, 34 y.o.   MRN: [id number]  [facility] PROGRESS NOTE  ALVON NYGAARD WFU:[id number] DOB: 32-Jul-1982 DOA: 10/25/2014 PCP: Elyn Peers, MD   Brief narrative:    Mr. Kras is a 34 y/o man w/ Spina Bifida, paraplegia, complex surgical history following a chole with a bowel perforation in October 2025 complicated by ARDS presented with metabolic disarray and altered mental status. He has had frequent UTIs in the past and has presented to [facility] in October of 2014 and had a cholecystectomy which was complicated by perforation of bowel requiring multiple surgical revisions/washouts done at [facility]. He does also have a history of hydrocephalous with VP shunt. When he had his complex abdominal surgical situation, he did have his drain externalized, and his mother reports that the catheter was ultimately placed in his chest, in what sounds like is his pleural space. Ten days prior to admission, the patient had a "gi bug" that his mother had as well, with dehydration, nausea, and poor PO intake. The patient had persistent watery output from his ostomy, which is usual for him. A few days prior to admission, he became more confused, and was "not himself" on the day of admission, so family presented to [facility] ED. Given need for NSGY consult, he was admitted to [facility] to PCCM.  SIGNIFICANT EVENTS / STUDIES:  CT head / shunt series 2022/11/17 > acute ventriculomegaly 2/16 - care transferred to [facility] from PCCM   Assessment/Plan:    Acute encephalopathy - hydrocephalous, ? UTI, unlikely shunt infection due to fairly quick improvement in mental status on 4 day of ABX - DC  Cefepime day #5, and start keflex   Severe sepsis, septic shock  - likely secondary to E. Coli UTI  - pt clinically stable, VSS stable, no fever over the past 24 ours, WBC is WNL -ID  Recommend discharge home on cephalexin 500 mg by mouth 4 times daily for 9 more days: cefepime day #5, DC TODAY ,   plan to treat for total of 14 days  Will see if pt tolerates PO meds    Acute renal failure due to severe dehydration - pre renal etiology - pt clinically stable this AM, good urine output  - Cr is now WNL  Severe Hypomagnesemia  Replete with 3 gm of mgso4    Hyponatremia - IVF provided and responding well, Na is now WNL   Hypokalemia with hypomagnesemia and hypophosphatemia  - continue to supplement and repeat BMP in AM, check Mg and phosp levels as well    Colitis / enteritis - c diff neg - improving overall   Leukocytosis - Due to sepsis,increasing 6.4> 13.9 , repeat in  AM  Drop in Hg since admission - possibly dilutional from IVF, no signs of active bleeding - needs to be monitored closely as H6 at baseline ~12 in the recent past  - CBC in AM  DM2 - Monitor CBGs  DVT prophylaxis - Heparin SQ   Code Status: Full.  Family Communication:  plan of care discussed with family at bedside  Disposition Plan: DC in am if nausea improves    Lines/Tubes:   PIV Ostomy Condom-cath foley  Procedures and diagnostic studies:     Dg Cervical Spine 2-3 Views  10/25/2014   wo shunt catheters course inferiorly from the right scalp, 1 appears blind ending. The second continues into the chest. Large body habitus.    Ct Head Wo Contrast  10/27/2014  Stable appearance of ventriculoperitoneal shunt with distal tip at LEFT frontal horn of lateral ventricle. Confluent periventricular RIGHT matter hypodensities may reflect interstitial edema, recommend correlation with shunt pressures.  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     Ct Head Wo Contrast  10/25/2014   Dysplastic ventricles with evidence of acute ventriculomegaly with some transependymal edema. 2. Effaced basilar cisterns including the cisterna magna. 3. Right posterior approach shunt catheter, tip at the left frontal horn.    Dg Abd Acute W/chest  10/25/2014  Shunt catheter is at a level just below  the diaphragm in the midline of the abdomen. It is not possible to ascertain that the catheter is in the peritoneum versus in the soft tissues overlying the intra-abdominal contents. Bowel gas pattern unremarkable. Postoperative change in the spine region. Note that there is swelling of a portion of the shunt catheter in the neck region on the right. No lung edema or consolidation.     Medical Consultants:   Neurosurgery ID PCCM initially attending team, care transferred to [facility] 10/28/2014  Other Consultants:   None  IAnti-Infectives:    Cefepime 2/13 -->   Reyne Dumas, MD  [facility] Pager [phone]  If 7PM-7AM, please contact night-coverage [email or web address] Password [facility] 10/29/2014, 6:31 PM   LOS: 4 days   HPI/Subjective: No events overnight. Mother by the bedside , nauseous at the time of my interview   Objective: Filed Vitals:   10/28/14 2139 10/29/14 0630 10/29/14 1000 10/29/14 1714  BP: 109/60 94/51 101/47 110/65  Pulse: 120 106 109 98  Temp: 98.7 F (37.1 C) 97.9 F (36.6 C) 98 F (36.7 C) 98.4 F (36.9 C)  TempSrc: Oral Oral Oral Oral  Resp: 16 15 18 18   Height: 5' 2.99" (1.6 m)     Weight: 81.84 kg (180 lb 6.8 oz)     SpO2: 100% 100% 100% 100%    Intake/Output Summary (Last 24 hours) at 10/29/14 1831 Last data filed at 10/29/14 1700  Gross per 24 hour  Intake    600 ml  Output   7375 ml  Net  -6775 ml    Exam:   General:  Pt is alert, follows commands appropriately, not in acute distress  Cardiovascular: Regular rate and rhythm, S1/S2, no murmurs, no rubs, no gallops  Respiratory: Clear to auscultation bilaterally, no wheezing, diminished breath sounds at bases   Abdomen: Soft, non tender, non distended, bowel sounds present, no guarding  Extremities: pulses DP and PT palpable bilaterally  Data Reviewed: Basic Metabolic Panel:  Recent Labs Lab 10/27/14 0415 10/27/14 1725 10/28/14 0457 10/28/14 1540 10/29/14 0500  NA 141 138 141 142 136  K  2.7* 3.3* 3.1* 2.7* 3.7  CL 113* 114* 113* 109 109  CO2 22 20 21  17* 24  GLUCOSE 92 93 80 126* 79  BUN 37* 19 11 7 6   CREATININE 0.76 0.69 0.54 0.64 0.51  CALCIUM 7.6* 7.6* 7.9* 8.4 8.3*  MG  --   --  1.2*  --  1.2*  PHOS  --   --  1.0*  --  2.0*   Liver Function Tests:  Recent Labs Lab 10/25/14 2004  AST 49*  ALT 41  ALKPHOS 129*  BILITOT 1.4*  PROT 10.2*  ALBUMIN 4.7   CBC:  Recent Labs Lab 10/25/14 2004 10/26/14 0326 10/27/14 0415 10/29/14 0500  WBC 27.6* 13.3* 6.4 13.9*  NEUTROABS 25.4*  --   --   --   HGB 15.7  10.9* 9.1* 9.8*  HCT 43.8 31.3* 26.4* 28.7*  MCV 74.2* 73.6* 73.3* 75.5*  PLT 333 235 210 179   CBG:  Recent Labs Lab 10/28/14 1657 10/28/14 2138 10/29/14 0749 10/29/14 1128 10/29/14 1614  GLUCAP 122* 119* 74 93 113*    Recent Results (from the past 240 hour(s))  Blood culture (routine x 2)     Status: None (Preliminary result)   Collection Time: 10/25/14  9:00 PM  Result Value Ref Range Status   Specimen Description BLOOD RIGHT ANTECUBITAL  Final   Special Requests BOTTLES DRAWN AEROBIC ONLY 5CC  Final   Culture   Final           BLOOD CULTURE RECEIVED NO GROWTH TO DATE CULTURE WILL BE HELD FOR 5 DAYS BEFORE ISSUING A FINAL NEGATIVE REPORT Performed at [facility]    Report Status PENDING  Incomplete  Blood culture (routine x 2)     Status: None (Preliminary result)   Collection Time: 10/25/14  9:11 PM  Result Value Ref Range Status   Specimen Description BLOOD RIGHT ANTECUBITAL  Final   Special Requests BOTTLES DRAWN AEROBIC ONLY 5CC  Final   Culture   Final           BLOOD CULTURE RECEIVED NO GROWTH TO DATE CULTURE WILL BE HELD FOR 5 DAYS BEFORE ISSUING A FINAL NEGATIVE REPORT Performed at [facility]    Report Status PENDING  Incomplete  Urine culture     Status: None   Collection Time: 10/25/14  9:41 PM  Result Value Ref Range Status   Specimen Description URINE, CATHETERIZED  Final   Special Requests NONE   Final   Colony Count   Final    >=100,000 COLONIES/ML Performed at [facility]    Culture   Final    ESCHERICHIA COLI Performed at [facility]    Report Status 10/28/2014 FINAL  Final   Organism ID, Bacteria ESCHERICHIA COLI  Final      Susceptibility   Escherichia coli - MIC*    AMPICILLIN >=32 RESISTANT Resistant     CEFAZOLIN <=4 SENSITIVE Sensitive     CEFTRIAXONE <=1 SENSITIVE Sensitive     CIPROFLOXACIN <=0.25 SENSITIVE Sensitive     GENTAMICIN >=16 RESISTANT Resistant     LEVOFLOXACIN <=0.12 SENSITIVE Sensitive     NITROFURANTOIN <=16 SENSITIVE Sensitive     TOBRAMYCIN 8 INTERMEDIATE Intermediate     TRIMETH/SULFA >=320 RESISTANT Resistant     PIP/TAZO <=4 SENSITIVE Sensitive     * ESCHERICHIA COLI  MRSA PCR Screening     Status: None   Collection Time: 10/26/14  1:39 AM  Result Value Ref Range Status   MRSA by PCR NEGATIVE NEGATIVE Final    Comment:        The GeneXpert MRSA Assay (FDA approved for NASAL specimens only), is one component of a comprehensive MRSA colonization surveillance program. It is not intended to diagnose MRSA infection nor to guide or monitor treatment for MRSA infections.   Ova and parasite examination     Status: None   Collection Time: 10/26/14  4:04 AM  Result Value Ref Range Status   Specimen Description STOOL  Final   Special Requests NONE  Final   Ova and parasites   Final    NO OVA OR PARASITES SEEN Performed at [facility]    Report Status 10/28/2014 FINAL  Final  Clostridium Difficile by PCR     Status: None  Collection Time: 10/26/14  4:04 AM  Result Value Ref Range Status   C difficile by pcr NEGATIVE NEGATIVE Final     Scheduled Meds: . cephALEXin  500 mg Oral 4 times per day  . feeding supplement (ENSURE COMPLETE)  237 mL Oral BID BM  . heparin  5,000 Units Subcutaneous 3 times per day  . insulin aspart  0-5 Units Subcutaneous QHS  . insulin aspart  0-9 Units Subcutaneous TID WC    Continuous Infusions: . sodium chloride 75 mL/hr at 10/29/14 1652

## 2014-10-29 NOTE — Plan of Care (Signed)
Problem: Phase I Progression Outcomes Goal: Voiding-avoid urinary catheter unless indicated Outcome: Not Applicable Date Met:  78/41/28 Chronic foley

## 2014-10-29 NOTE — Progress Notes (Signed)
Patient ID: [name], male   DOB: August 26, 1981, 34 y.o.   MRN: [id number]         [facility]    Date of Admission:  10/25/2014   Total days of antibiotics 5          Active Problems:   Spina bifida   Septic shock   Protein-calorie malnutrition, severe   Obstructed VP shunt   Severe sepsis   Renal failure (ARF), acute on chronic   Severe sepsis with septic shock   UTI (lower urinary tract infection)   H/O ventricular shunt   . ceFEPime (MAXIPIME) IV  1 g Intravenous Q12H  . feeding supplement (ENSURE COMPLETE)  237 mL Oral BID BM  . heparin  5,000 Units Subcutaneous 3 times per day  . insulin aspart  0-5 Units Subcutaneous QHS  . insulin aspart  0-9 Units Subcutaneous TID WC    Subjective: He is feeling better and denies any headache. He is having some burning in his left hand where his IV is infusing.  Review of Systems: Pertinent items are noted in HPI.  Past Medical History  Diagnosis Date  . Hypertension   . GERD (gastroesophageal reflux disease)   . Non-healing surgical wound     ABDOMINE  . 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 Arnold-Chiari malformation   . Neurogenic bladder     INCONTINENT -  . 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 Ileostomy in place   . Wheelchair bound     CAN TRASFER SELF WITHOUT BOARD  . Urinary incontinence with continuous leakage   . Wears glasses   . Pressure ulcer, buttock   . Borderline diabetes     DIET CONTROLLED  . Heart palpitations     SECONDARY TO VP SHUNT DRAIN  . History of seizure as newborn     NO MEDS SINCE 34 YRS OLD--  NO ISSUES SINCE  . Neurogenic bowel   . At risk for sleep apnea     STOP-BANG = 4   SENT TO PCP 11-01-2013    History  Substance Use Topics  . Smoking status: Never Smoker   . Smokeless tobacco: Never Used  . Alcohol Use: No    History reviewed. No  pertinent family history. Allergies  Allergen Reactions  . Latex Hives  . Adhesive [Tape] Hives  . Ciprofloxacin Other (See Comments)    IV only-- caused burning in arm   . Vancomycin Itching and Swelling    OBJECTIVE: Blood pressure 94/51, pulse 106, temperature 97.9 F (36.6 C), temperature source Oral, resp. rate 15, height 5' 2.99" (1.6 m), weight 180 lb 6.8 oz (81.84 kg), SpO2 100 %. General: He is alert and comfortable. Skin: No rash. 2 IV sites in left arm appear normal although he may have some infiltration in his forearm. Lungs: Clear Cor: Regular S1 and S2 with no murmurs Abdomen: Soft and nontender Neuro: Neck supple. Normal speech and conversation  Lab Results Lab Results  Component Value Date   WBC 13.9* 10/29/2014   HGB 9.8* 10/29/2014   HCT 28.7* 10/29/2014   MCV 75.5* 10/29/2014   PLT 179 10/29/2014    Lab Results  Component Value Date   CREATININE 0.51 10/29/2014   BUN 6 10/29/2014   NA 136 10/29/2014   K 3.7 10/29/2014   CL 109 10/29/2014  CO2 24 10/29/2014    Lab Results  Component Value Date   ALT 41 10/25/2014   AST 49* 10/25/2014   ALKPHOS 129* 10/25/2014   BILITOT 1.4* 10/25/2014     Microbiology: Recent Results (from the past 240 hour(s))  Blood culture (routine x 2)     Status: None (Preliminary result)   Collection Time: 10/25/14  9:00 PM  Result Value Ref Range Status   Specimen Description BLOOD RIGHT ANTECUBITAL  Final   Special Requests BOTTLES DRAWN AEROBIC ONLY 5CC  Final   Culture   Final           BLOOD CULTURE RECEIVED NO GROWTH TO DATE CULTURE WILL BE HELD FOR 5 DAYS BEFORE ISSUING A FINAL NEGATIVE REPORT Performed at [facility]    Report Status PENDING  Incomplete  Blood culture (routine x 2)     Status: None (Preliminary result)   Collection Time: 10/25/14  9:11 PM  Result Value Ref Range Status   Specimen Description BLOOD RIGHT ANTECUBITAL  Final   Special Requests BOTTLES DRAWN AEROBIC ONLY 5CC  Final    Culture   Final           BLOOD CULTURE RECEIVED NO GROWTH TO DATE CULTURE WILL BE HELD FOR 5 DAYS BEFORE ISSUING A FINAL NEGATIVE REPORT Performed at [facility]    Report Status PENDING  Incomplete  Urine culture     Status: None   Collection Time: 10/25/14  9:41 PM  Result Value Ref Range Status   Specimen Description URINE, CATHETERIZED  Final   Special Requests NONE  Final   Colony Count   Final    >=100,000 COLONIES/ML Performed at [facility]    Culture   Final    ESCHERICHIA COLI Performed at [facility]    Report Status 10/28/2014 FINAL  Final   Organism ID, Bacteria ESCHERICHIA COLI  Final      Susceptibility   Escherichia coli - MIC*    AMPICILLIN >=32 RESISTANT Resistant     CEFAZOLIN <=4 SENSITIVE Sensitive     CEFTRIAXONE <=1 SENSITIVE Sensitive     CIPROFLOXACIN <=0.25 SENSITIVE Sensitive     GENTAMICIN >=16 RESISTANT Resistant     LEVOFLOXACIN <=0.12 SENSITIVE Sensitive     NITROFURANTOIN <=16 SENSITIVE Sensitive     TOBRAMYCIN 8 INTERMEDIATE Intermediate     TRIMETH/SULFA >=320 RESISTANT Resistant     PIP/TAZO <=4 SENSITIVE Sensitive     * ESCHERICHIA COLI  MRSA PCR Screening     Status: None   Collection Time: 10/26/14  1:39 AM  Result Value Ref Range Status   MRSA by PCR NEGATIVE NEGATIVE Final    Comment:        The GeneXpert MRSA Assay (FDA approved for NASAL specimens only), is one component of a comprehensive MRSA colonization surveillance program. It is not intended to diagnose MRSA infection nor to guide or monitor treatment for MRSA infections.   Ova and parasite examination     Status: None   Collection Time: 10/26/14  4:04 AM  Result Value Ref Range Status   Specimen Description STOOL  Final   Special Requests NONE  Final   Ova and parasites   Final    NO OVA OR PARASITES SEEN Performed at [facility]    Report Status 10/28/2014 FINAL  Final  Clostridium Difficile by PCR     Status: None    Collection Time: 10/26/14  4:04 AM  Result Value  Ref Range Status   C difficile by pcr NEGATIVE NEGATIVE Final    Assessment: His recent fever was probably due to an Escherichia coli UTI. I recommend changing to oral cephalexin to complete 9 more days of therapy.  Plan: 1. Recommend discharge home on cephalexin 500 mg by mouth 4 times daily for 9 more days. 2. I will sign off now.  Michel Bickers, MD [facility] [phone] pager   [phone] cell 10/29/2014, 9:31 AM

## 2014-10-30 LAB — MAGNESIUM: Magnesium: 1.7 mg/dL (ref 1.5–2.5)

## 2014-10-30 LAB — COMPREHENSIVE METABOLIC PANEL
ALK PHOS: 64 U/L (ref 39–117)
ALT: 28 U/L (ref 0–53)
ANION GAP: 8 (ref 5–15)
AST: 17 U/L (ref 0–37)
Albumin: 2.6 g/dL — ABNORMAL LOW (ref 3.5–5.2)
BILIRUBIN TOTAL: 0.5 mg/dL (ref 0.3–1.2)
BUN: <5 mg/dL — ABNORMAL LOW (ref 6–23)
CO2: 22 mmol/L (ref 19–32)
Calcium: 8.5 mg/dL (ref 8.4–10.5)
Chloride: 105 mmol/L (ref 96–112)
Creatinine, Ser: 0.47 mg/dL — ABNORMAL LOW (ref 0.50–1.35)
GFR calc Af Amer: >90 mL/min (ref >90)
GLUCOSE: 71 mg/dL (ref 70–99)
POTASSIUM: 3.4 mmol/L — AB (ref 3.5–5.1)
SODIUM: 135 mmol/L (ref 135–145)
Total Protein: 6 g/dL (ref 6.0–8.3)

## 2014-10-30 LAB — CBC
HCT: 30.8 % — ABNORMAL LOW (ref 39.0–52.0)
Hemoglobin: 10.1 g/dL — ABNORMAL LOW (ref 13.0–17.0)
MCH: 25.8 pg — AB (ref 26.0–34.0)
MCHC: 32.8 g/dL (ref 30.0–36.0)
MCV: 78.6 fL (ref 78.0–100.0)
PLATELETS: 167 10*3/uL (ref 150–400)
RBC: 3.92 MIL/uL — ABNORMAL LOW (ref 4.22–5.81)
RDW: 14.1 % (ref 11.5–15.5)
WBC: 14.5 10*3/uL — ABNORMAL HIGH (ref 4.0–10.5)

## 2014-10-30 LAB — GLUCOSE, CAPILLARY
Glucose-Capillary: 73 mg/dL (ref 70–99)
Glucose-Capillary: 109 mg/dL — ABNORMAL HIGH (ref 70–99)

## 2014-10-30 LAB — PHOSPHORUS: Phosphorus: 2.5 mg/dL (ref 2.3–4.6)

## 2014-10-30 MED ORDER — ENSURE COMPLETE PO LIQD: 237.0000 mL | Freq: Two times a day (BID) | ORAL | Status: DC

## 2014-10-30 MED ORDER — POTASSIUM CHLORIDE ER 20 MEQ PO TBCR: 20.0000 meq | EXTENDED_RELEASE_TABLET | Freq: Every day | ORAL | Status: DC

## 2014-10-30 MED ORDER — CEPHALEXIN 500 MG PO CAPS: 500.0000 mg | ORAL_CAPSULE | Freq: Four times a day (QID) | ORAL | Status: DC

## 2014-10-30 MED ORDER — POTASSIUM CHLORIDE CRYS ER 20 MEQ PO TBCR
40.0000 meq | EXTENDED_RELEASE_TABLET | Freq: Once | ORAL | Status: AC
  Administered 2014-10-30: 40 meq via ORAL
  Filled 2014-10-30: qty 2

## 2014-10-30 MED ORDER — MAGNESIUM OXIDE 400 MG PO TABS: 400.0000 mg | ORAL_TABLET | Freq: Two times a day (BID) | ORAL | Status: DC

## 2014-10-30 MED ORDER — METOPROLOL TARTRATE 50 MG PO TABS: 50.0000 mg | ORAL_TABLET | Freq: Two times a day (BID) | ORAL | Status: DC

## 2014-10-30 MED ORDER — OXYCODONE HCL 5 MG PO TABS: 5.0000 mg | ORAL_TABLET | Freq: Four times a day (QID) | ORAL | Status: DC | PRN

## 2014-10-30 NOTE — Discharge Summary (Addendum)
Physician Discharge Summary  [name] MRN: [id number] DOB/AGE: 1981-07-04 34 y.o.  PCP: Elyn Peers, MD   Admit date: 10/25/2014 Discharge date: 10/30/2014  Discharge Diagnoses:    Spina bifida   Septic shock   Protein-calorie malnutrition, severe   Obstructed VP shunt   Severe sepsis   Renal failure (ARF), acute on chronic   Severe sepsis with septic shock   UTI (lower urinary tract infection)   H/O ventricular shunt   Follow-up recommendations Follow-up with PCP in 5-7 days Repeat CMP, magnesium, CBC in 3-5 days    Medication List    STOP taking these medications        loperamide 2 MG tablet  Commonly known as:  IMODIUM A-D      TAKE these medications        acetaminophen 650 MG CR tablet  Commonly known as:  TYLENOL  Take 1,300 mg by mouth every 8 (eight) hours as needed for pain.     cephALEXin 500 MG capsule  Commonly known as:  KEFLEX  Take 1 capsule (500 mg total) by mouth 4 (four) times daily.     feeding supplement (ENSURE COMPLETE) Liqd  Take 237 mLs by mouth 2 (two) times daily between meals.     metoprolol 50 MG tablet  Commonly known as:  LOPRESSOR  Take 1 tablet (50 mg total) by mouth 2 (two) times daily.  Start taking on:  11/03/2014     multivitamin tablet  Take 1 tablet by mouth daily.     ondansetron 4 MG tablet  Commonly known as:  ZOFRAN  Take 4 mg by mouth every 8 (eight) hours as needed for nausea or vomiting.     oxyCODONE 5 MG immediate release tablet  Commonly known as:  Oxy IR/ROXICODONE  Take 1 tablet (5 mg total) by mouth every 6 (six) hours as needed. 1 by mouth every 3 hours as needed for MILD pain, take 2 by mouth every 3 hours as needed for MODERATE pain, 3 by mouth every 3 hours as needed for SEVERE pain        Discharge Condition: Stable   Disposition: 01-Home or Self Care   Consults: Infectious disease    Significant Diagnostic Studies: Dg Cervical Spine 2-3 Views  10/25/2014   CLINICAL  DATA:  34 year old male VP shunt. Headache. Initial encounter.  EXAM: CERVICAL SPINE - 2-3 VIEW  COMPARISON:  Chest CTA 07/08/2013.  FINDINGS: Large body habitus. Shunt tubing tracks inferiorly in the right neck can't into the chest. There is a blind ending component of tubing suspected. Upper thoracic spinal rods. No acute osseous abnormality identified.  IMPRESSION: Two shunt catheters course inferiorly from the right scalp, 1 appears blind ending. The second continues into the chest. Large body habitus.   Electronically Signed   By: Genevie Ann M.D.   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 Hospital admission for sepsis.  EXAM: CT HEAD WITHOUT CONTRAST  TECHNIQUE: Contiguous axial images were obtained from the base of the skull through the vertex without intravenous contrast.  COMPARISON:  CT of the head October 25, 2014  FINDINGS: Ventriculoperitoneal shunt via RIGHT parietal burr hole, distal tip in the frontal horn of LEFT lateral ventricle. Again noted is irregular configuration of the ventricles with steer horn appearance consistent with agenesis of the corpus callosum. Patchy periventricular white matter hypodensities, unchanged.  Poor visualization of the sulci, which may in part reflect sequelae of hydrocephalus and congenital in etiology, motion. No intraparenchymal hemorrhage, midline shift. No acute large vascular territory infarct. Featureless posterior fossa with cerebellar tonsillar descent below the foramen magnum consistent with provided history.  Mild paranasal sinus mucosal thickening without air-fluid levels. The mastoid air cells are well aerated.  IMPRESSION: Stable appearance of ventriculoperitoneal shunt with distal tip at LEFT frontal horn of lateral ventricle. Confluent periventricular RIGHT matter hypodensities may reflect interstitial edema, recommend correlation with shunt  pressures.  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   Electronically Signed   By: Elon Alas   On: 10/27/2014 05:28   Ct Head Wo Contrast  10/25/2014   CLINICAL DATA:  34 year old male with VP shunt and headache. Initial encounter.  EXAM: CT HEAD WITHOUT CONTRAST  TECHNIQUE: Contiguous axial images were obtained from the base of the skull through the vertex without intravenous contrast.  COMPARISON:  Report of the head CT 02/21/2001 (no images available).  FINDINGS: Visualized paranasal sinuses and mastoids are clear. Right posterior approach shunt catheter with burr hole. Hyperostosis of the calvarium. No acute osseous abnormality identified. Visualized orbit soft tissues are within normal limits.  Right posterior scalp shunt reservoir. Two separate shunt catheters continue inferiorly.  The internal component of the ventriculostomy shunt terminates at the left frontal horn. Dysplastic ventricles. Ventriculomegaly including enlarged temporal horns. Mild periventricular hypodensity compatible with transependymal edema. Effaced basilar cisterns including the cisterna magna. No acute intracranial hemorrhage identified. No extra-axial collection identified. No evidence of cortically based acute infarction identified.  IMPRESSION: 1. Dysplastic ventricles with evidence of acute ventriculomegaly with some transependymal edema. 2. Effaced basilar cisterns including the cisterna magna. 3. Right posterior approach shunt catheter, tip at the left frontal horn.  Study discussed by telephone with Dr. Shirlyn Goltz on 10/25/2014 at 21:01.   Electronically Signed   By: Genevie Ann M.D.   On: 10/25/2014 21:04   Dg Abd Acute W/chest  10/25/2014   CLINICAL DATA:  Ventriculoperitoneal shunt present. Headache for 1 week. Assessing ventriculoperitoneal shunt location  EXAM: ACUTE ABDOMEN SERIES (ABDOMEN 2 VIEW & CHEST 1 VIEW)  COMPARISON:  None.  FINDINGS: PA chest: Ventriculoperitoneal shunt is seen  along the right hemithorax. There appears to be coiling of the shunt in the right neck region. Lungs are clear. Heart size is within normal limits. Pulmonary vascularity is normal. There is extensive postoperative change in the thoracic spine with extensive spinal dysraphism present.  Supine and left lateral decubitus abdomen: The shunt catheter tip is seen on the decubitus examination. Tip is just below the diaphragm in the midline of the right abdomen. The overall bowel gas pattern is unremarkable. No obstruction or free air. Postoperative change in the lumbar spine with spinal dysraphism noted.  IMPRESSION: Shunt catheter is at a level just below the diaphragm in the midline of the abdomen. It is not possible to ascertain that the catheter is in the peritoneum versus in the soft tissues overlying the intra-abdominal contents. Bowel gas pattern unremarkable. Postoperative change in the spine region. Note that there is swelling of a portion of the shunt catheter in the neck region on the right. No lung edema or consolidation.   Electronically Signed   By: Lowella Grip III M.D.   On: 10/25/2014 21:11      Microbiology: Recent Results (from the past 240 hour(s))  Blood culture (routine x 2)     Status: None (Preliminary  result)   Collection Time: 10/25/14  9:00 PM  Result Value Ref Range Status   Specimen Description BLOOD RIGHT ANTECUBITAL  Final   Special Requests BOTTLES DRAWN AEROBIC ONLY 5CC  Final   Culture   Final           BLOOD CULTURE RECEIVED NO GROWTH TO DATE CULTURE WILL BE HELD FOR 5 DAYS BEFORE ISSUING A FINAL NEGATIVE REPORT Performed at [facility]    Report Status PENDING  Incomplete  Blood culture (routine x 2)     Status: None (Preliminary result)   Collection Time: 10/25/14  9:11 PM  Result Value Ref Range Status   Specimen Description BLOOD RIGHT ANTECUBITAL  Final   Special Requests BOTTLES DRAWN AEROBIC ONLY 5CC  Final   Culture   Final           BLOOD  CULTURE RECEIVED NO GROWTH TO DATE CULTURE WILL BE HELD FOR 5 DAYS BEFORE ISSUING A FINAL NEGATIVE REPORT Performed at [facility]    Report Status PENDING  Incomplete  Urine culture     Status: None   Collection Time: 10/25/14  9:41 PM  Result Value Ref Range Status   Specimen Description URINE, CATHETERIZED  Final   Special Requests NONE  Final   Colony Count   Final    >=100,000 COLONIES/ML Performed at [facility]    Culture   Final    ESCHERICHIA COLI Performed at [facility]    Report Status 10/28/2014 FINAL  Final   Organism ID, Bacteria ESCHERICHIA COLI  Final      Susceptibility   Escherichia coli - MIC*    AMPICILLIN >=32 RESISTANT Resistant     CEFAZOLIN <=4 SENSITIVE Sensitive     CEFTRIAXONE <=1 SENSITIVE Sensitive     CIPROFLOXACIN <=0.25 SENSITIVE Sensitive     GENTAMICIN >=16 RESISTANT Resistant     LEVOFLOXACIN <=0.12 SENSITIVE Sensitive     NITROFURANTOIN <=16 SENSITIVE Sensitive     TOBRAMYCIN 8 INTERMEDIATE Intermediate     TRIMETH/SULFA >=320 RESISTANT Resistant     PIP/TAZO <=4 SENSITIVE Sensitive     * ESCHERICHIA COLI  MRSA PCR Screening     Status: None   Collection Time: 10/26/14  1:39 AM  Result Value Ref Range Status   MRSA by PCR NEGATIVE NEGATIVE Final    Comment:        The GeneXpert MRSA Assay (FDA approved for NASAL specimens only), is one component of a comprehensive MRSA colonization surveillance program. It is not intended to diagnose MRSA infection nor to guide or monitor treatment for MRSA infections.   Ova and parasite examination     Status: None   Collection Time: 10/26/14  4:04 AM  Result Value Ref Range Status   Specimen Description STOOL  Final   Special Requests NONE  Final   Ova and parasites   Final    NO OVA OR PARASITES SEEN Performed at [facility]    Report Status 10/28/2014 FINAL  Final  Clostridium Difficile by PCR     Status: None   Collection Time: 10/26/14  4:04 AM   Result Value Ref Range Status   C difficile by pcr NEGATIVE NEGATIVE Final     Labs: Results for orders placed or performed during the hospital encounter of 10/25/14 (from the past 48 hour(s))  Basic metabolic panel     Status: Abnormal   Collection Time: 10/28/14  3:40 PM  Result Value Ref Range  Sodium 142 135 - 145 mmol/L   Potassium 2.7 (LL) 3.5 - 5.1 mmol/L    Comment: REPEATED TO VERIFY CRITICAL RESULT CALLED TO, READ BACK BY AND VERIFIED WITH: C LUCK,RN 1702 10/28/14 WBOND    Chloride 109 96 - 112 mmol/L   CO2 17 (L) 19 - 32 mmol/L   Glucose, Bld 126 (H) 70 - 99 mg/dL   BUN 7 6 - 23 mg/dL   Creatinine, Ser 0.64 0.50 - 1.35 mg/dL   Calcium 8.4 8.4 - 10.5 mg/dL   GFR calc non Af Amer >90 >90 mL/min   GFR calc Af Amer >90 >90 mL/min    Comment: (NOTE) The eGFR has been calculated using the CKD EPI equation. This calculation has not been validated in all clinical situations. eGFR's persistently <90 mL/min signify possible Chronic Kidney Disease.    Anion gap 16 (H) 5 - 15  Glucose, capillary     Status: Abnormal   Collection Time: 10/28/14  4:57 PM  Result Value Ref Range   Glucose-Capillary 122 (H) 70 - 99 mg/dL  Glucose, capillary     Status: Abnormal   Collection Time: 10/28/14  9:38 PM  Result Value Ref Range   Glucose-Capillary 119 (H) 70 - 99 mg/dL  Basic metabolic panel     Status: Abnormal   Collection Time: 10/29/14  5:00 AM  Result Value Ref Range   Sodium 136 135 - 145 mmol/L   Potassium 3.7 3.5 - 5.1 mmol/L    Comment: DELTA CHECK NOTED   Chloride 109 96 - 112 mmol/L   CO2 24 19 - 32 mmol/L   Glucose, Bld 79 70 - 99 mg/dL   BUN 6 6 - 23 mg/dL   Creatinine, Ser 0.51 0.50 - 1.35 mg/dL   Calcium 8.3 (L) 8.4 - 10.5 mg/dL   GFR calc non Af Amer >90 >90 mL/min   GFR calc Af Amer >90 >90 mL/min    Comment: (NOTE) The eGFR has been calculated using the CKD EPI equation. This calculation has not been validated in all clinical situations. eGFR's  persistently <90 mL/min signify possible Chronic Kidney Disease.    Anion gap 3 (L) 5 - 15  CBC     Status: Abnormal   Collection Time: 10/29/14  5:00 AM  Result Value Ref Range   WBC 13.9 (H) 4.0 - 10.5 K/uL   RBC 3.80 (L) 4.22 - 5.81 MIL/uL   Hemoglobin 9.8 (L) 13.0 - 17.0 g/dL   HCT 28.7 (L) 39.0 - 52.0 %   MCV 75.5 (L) 78.0 - 100.0 fL   MCH 25.8 (L) 26.0 - 34.0 pg   MCHC 34.1 30.0 - 36.0 g/dL   RDW 13.9 11.5 - 15.5 %   Platelets 179 150 - 400 K/uL  Magnesium     Status: Abnormal   Collection Time: 10/29/14  5:00 AM  Result Value Ref Range   Magnesium 1.2 (L) 1.5 - 2.5 mg/dL  Phosphorus     Status: Abnormal   Collection Time: 10/29/14  5:00 AM  Result Value Ref Range   Phosphorus 2.0 (L) 2.3 - 4.6 mg/dL  Glucose, capillary     Status: None   Collection Time: 10/29/14  7:49 AM  Result Value Ref Range   Glucose-Capillary 74 70 - 99 mg/dL  Glucose, capillary     Status: None   Collection Time: 10/29/14 11:28 AM  Result Value Ref Range   Glucose-Capillary 93 70 - 99 mg/dL  Glucose, capillary  Status: Abnormal   Collection Time: 10/29/14  4:14 PM  Result Value Ref Range   Glucose-Capillary 113 (H) 70 - 99 mg/dL  Glucose, capillary     Status: None   Collection Time: 10/29/14  9:33 PM  Result Value Ref Range   Glucose-Capillary 91 70 - 99 mg/dL  Magnesium     Status: None   Collection Time: 10/30/14  6:40 AM  Result Value Ref Range   Magnesium 1.7 1.5 - 2.5 mg/dL  Phosphorus     Status: None   Collection Time: 10/30/14  6:40 AM  Result Value Ref Range   Phosphorus 2.5 2.3 - 4.6 mg/dL  CBC     Status: Abnormal   Collection Time: 10/30/14  6:40 AM  Result Value Ref Range   WBC 14.5 (H) 4.0 - 10.5 K/uL   RBC 3.92 (L) 4.22 - 5.81 MIL/uL   Hemoglobin 10.1 (L) 13.0 - 17.0 g/dL   HCT 30.8 (L) 39.0 - 52.0 %   MCV 78.6 78.0 - 100.0 fL   MCH 25.8 (L) 26.0 - 34.0 pg   MCHC 32.8 30.0 - 36.0 g/dL   RDW 14.1 11.5 - 15.5 %   Platelets 167 150 - 400 K/uL  Comprehensive  metabolic panel     Status: Abnormal   Collection Time: 10/30/14  6:40 AM  Result Value Ref Range   Sodium 135 135 - 145 mmol/L   Potassium 3.4 (L) 3.5 - 5.1 mmol/L   Chloride 105 96 - 112 mmol/L   CO2 22 19 - 32 mmol/L   Glucose, Bld 71 70 - 99 mg/dL   BUN <5 (L) 6 - 23 mg/dL   Creatinine, Ser 0.47 (L) 0.50 - 1.35 mg/dL   Calcium 8.5 8.4 - 10.5 mg/dL   Total Protein 6.0 6.0 - 8.3 g/dL   Albumin 2.6 (L) 3.5 - 5.2 g/dL   AST 17 0 - 37 U/L   ALT 28 0 - 53 U/L   Alkaline Phosphatase 64 39 - 117 U/L   Total Bilirubin 0.5 0.3 - 1.2 mg/dL   GFR calc non Af Amer >90 >90 mL/min   GFR calc Af Amer >90 >90 mL/min    Comment: (NOTE) The eGFR has been calculated using the CKD EPI equation. This calculation has not been validated in all clinical situations. eGFR's persistently <90 mL/min signify possible Chronic Kidney Disease.    Anion gap 8 5 - 15  Glucose, capillary     Status: None   Collection Time: 10/30/14  8:08 AM  Result Value Ref Range   Glucose-Capillary 73 70 - 99 mg/dL  Glucose, capillary     Status: Abnormal   Collection Time: 10/30/14 11:46 AM  Result Value Ref Range   Glucose-Capillary 109 (H) 70 - 99 mg/dL    Mr. Stead is a 34 y/o man w/ Spina Bifida, paraplegia, complex surgical history following a chole with a bowel perforation in October 6979 complicated by ARDS presented with metabolic disarray and altered mental status. He has had frequent UTIs in the past and has presented to [facility] in October of 2014 and had a cholecystectomy which was complicated by perforation of bowel requiring multiple surgical revisions/washouts done at [facility]. He does also have a history of hydrocephalous with VP shunt. When he had his complex abdominal surgical situation, he did have his drain externalized, and his mother reports that the catheter was ultimately placed in his chest, in what sounds like is his pleural space. Ten days prior to  admission, the patient had a "gi bug" that his mother  had as well, with dehydration, nausea, and poor PO intake. The patient had persistent watery output from his ostomy, which is usual for him. A few days prior to admission, he became more confused, and was "not himself" on the day of admission, so family presented to [facility] ED. Given need for NSGY consult, he was admitted to [facility] to PCCM.  SIGNIFICANT EVENTS / STUDIES:  CT head / shunt series 10-Nov-2022 > acute ventriculomegaly 2/16 - care transferred to [facility] from PCCM   Assessment/Plan:    Acute encephalopathy - hydrocephalous, ? UTI, unlikely shunt infection due to fairly quick improvement in mental status on 4 day of ABX - DC Cefepime day #5, and start keflex , patient to continue with this for 9 more days  Severe sepsis, septic shock  - likely secondary to E. Coli UTI  - pt clinically stable, VSS stable, no fever over the past 24 ours,  -ID Recommend discharge home on cephalexin 500 mg by mouth 4 times daily for 9 more days: Patient received a total of 5 days of cefepime plan to treat for total of 14 days  Patient is tolerating by mouth medications and will be discharged home today   Acute renal failure due to severe dehydration - pre renal etiology - pt clinically stable this AM, good urine output  - Cr is now WNL, 0.47 prior to discharge  Severe Hypomagnesemia  Replete with 3 gm of mgso4    Hyponatremia - IVF provided and responding well, Na is now WNL   Hypokalemia with hypomagnesemia and hypophosphatemia  Patient required aggressive magnesium and potassium supplementation During this hospitalization, he will be provided with supplementation for home with close outpatient monitoring  Colitis / enteritis - c diff neg - improving overall   Leukocytosis - Due to sepsis,increasing 6.4> 13.9 , white blood cell count 14.5 with a patient appears to be nontoxic, afebrile, hemodynamically stable   Normocytic anemia, baseline of about 9.5-10.5 Hemoglobin remained stable,  recheck in 3-5 days  DM2 CBC status within normal limits  DVT prophylaxis - Heparin SQ        Discharge Exam:  Blood pressure 115/61, pulse 107, temperature 98 F (36.7 C), temperature source Oral, resp. rate 18, height 5' 2.99" (1.6 m), weight 82.774 kg (182 lb 7.8 oz), SpO2 100 %.   General: Pt is alert, follows commands appropriately, not in acute distress  Cardiovascular: Regular rate and rhythm, S1/S2, no murmurs, no rubs, no gallops  Respiratory: Clear to auscultation bilaterally, no wheezing, diminished breath sounds at bases   Abdomen: Soft, non tender, non distended, bowel sounds present, no guarding  Extremities: pulses DP and PT palpable bilaterally       Discharge Instructions    Diet - low sodium heart healthy    Complete by:  As directed      Increase activity slowly    Complete by:  As directed            Follow-up Information    Follow up with Elyn Peers, MD. Schedule an appointment as soon as possible for a visit in 3 days.   Specialty:  Family Medicine   Contact information:   [address] [place] [id number] [phone]       Signed: Reyne Dumas 10/30/2014, 2:22 PM

## 2014-11-01 LAB — CULTURE, BLOOD (ROUTINE X 2)
Culture: NO GROWTH
Culture: NO GROWTH

## 2014-11-06 ENCOUNTER — Emergency Department (HOSPITAL_COMMUNITY): Payer: Medicare Other

## 2014-11-06 ENCOUNTER — Encounter (HOSPITAL_COMMUNITY): Payer: Self-pay | Admitting: Emergency Medicine

## 2014-11-06 ENCOUNTER — Inpatient Hospital Stay (HOSPITAL_COMMUNITY)
Admission: EM | Admit: 2014-11-06 | Discharge: 2014-11-07 | DRG: 690 | Disposition: A | Discharge status: Other Acute Inpatient Hospital | Payer: Medicare Other | Attending: Internal Medicine | Admitting: Internal Medicine

## 2014-11-06 DIAGNOSIS — Z982 Presence of cerebrospinal fluid drainage device: Secondary | ICD-10-CM | POA: Diagnosis not present

## 2014-11-06 DIAGNOSIS — Z9889 Other specified postprocedural states: Secondary | ICD-10-CM

## 2014-11-06 DIAGNOSIS — G808 Other cerebral palsy: Secondary | ICD-10-CM | POA: Diagnosis present

## 2014-11-06 DIAGNOSIS — N189 Chronic kidney disease, unspecified: Secondary | ICD-10-CM | POA: Diagnosis present

## 2014-11-06 DIAGNOSIS — Z79899 Other long term (current) drug therapy: Secondary | ICD-10-CM | POA: Diagnosis not present

## 2014-11-06 DIAGNOSIS — I10 Essential (primary) hypertension: Secondary | ICD-10-CM | POA: Diagnosis present

## 2014-11-06 DIAGNOSIS — D631 Anemia in chronic kidney disease: Secondary | ICD-10-CM | POA: Diagnosis present

## 2014-11-06 DIAGNOSIS — R51 Headache: Secondary | ICD-10-CM | POA: Diagnosis present

## 2014-11-06 DIAGNOSIS — Z993 Dependence on wheelchair: Secondary | ICD-10-CM | POA: Diagnosis not present

## 2014-11-06 DIAGNOSIS — I129 Hypertensive chronic kidney disease with stage 1 through stage 4 chronic kidney disease, or unspecified chronic kidney disease: Secondary | ICD-10-CM | POA: Diagnosis present

## 2014-11-06 DIAGNOSIS — B9620 Unspecified Escherichia coli [E. coli] as the cause of diseases classified elsewhere: Secondary | ICD-10-CM | POA: Diagnosis present

## 2014-11-06 DIAGNOSIS — N390 Urinary tract infection, site not specified: Secondary | ICD-10-CM | POA: Diagnosis present

## 2014-11-06 DIAGNOSIS — K219 Gastro-esophageal reflux disease without esophagitis: Secondary | ICD-10-CM | POA: Diagnosis present

## 2014-11-06 DIAGNOSIS — N319 Neuromuscular dysfunction of bladder, unspecified: Secondary | ICD-10-CM | POA: Diagnosis present

## 2014-11-06 DIAGNOSIS — IMO0002 Reserved for concepts with insufficient information to code with codable children: Secondary | ICD-10-CM

## 2014-11-06 DIAGNOSIS — Q054 Unspecified spina bifida with hydrocephalus: Secondary | ICD-10-CM | POA: Diagnosis not present

## 2014-11-06 DIAGNOSIS — R519 Headache, unspecified: Secondary | ICD-10-CM

## 2014-11-06 LAB — URINE MICROSCOPIC-ADD ON

## 2014-11-06 LAB — CBC
HCT: 32.5 % — ABNORMAL LOW (ref 39.0–52.0)
Hemoglobin: 10.3 g/dL — ABNORMAL LOW (ref 13.0–17.0)
MCH: 26.1 pg (ref 26.0–34.0)
MCHC: 31.7 g/dL (ref 30.0–36.0)
MCV: 82.3 fL (ref 78.0–100.0)
PLATELETS: 275 10*3/uL (ref 150–400)
RBC: 3.95 MIL/uL — ABNORMAL LOW (ref 4.22–5.81)
RDW: 13.9 % (ref 11.5–15.5)
WBC: 10.5 10*3/uL (ref 4.0–10.5)

## 2014-11-06 LAB — CREATININE, SERUM
Creatinine, Ser: 0.92 mg/dL (ref 0.50–1.35)
GFR calc non Af Amer: >90 mL/min (ref >90)

## 2014-11-06 LAB — URINALYSIS, ROUTINE W REFLEX MICROSCOPIC
Bilirubin Urine: NEGATIVE
GLUCOSE, UA: NEGATIVE mg/dL
Ketones, ur: NEGATIVE mg/dL
Nitrite: NEGATIVE
PH: 5.5 (ref 5.0–8.0)
Protein, ur: 30 mg/dL — AB
SPECIFIC GRAVITY, URINE: 1.02 (ref 1.005–1.030)
Urobilinogen, UA: 0.2 mg/dL (ref 0.0–1.0)

## 2014-11-06 LAB — BASIC METABOLIC PANEL
Anion gap: 16 — ABNORMAL HIGH (ref 5–15)
BUN: 32 mg/dL — ABNORMAL HIGH (ref 6–23)
CO2: 30 mmol/L (ref 19–32)
CREATININE: 1.03 mg/dL (ref 0.50–1.35)
Calcium: 9.6 mg/dL (ref 8.4–10.5)
Chloride: 86 mmol/L — ABNORMAL LOW (ref 96–112)
GFR calc Af Amer: >90 mL/min (ref >90)
GFR calc non Af Amer: >90 mL/min (ref >90)
Glucose, Bld: 114 mg/dL — ABNORMAL HIGH (ref 70–99)
Potassium: 4.3 mmol/L (ref 3.5–5.1)
Sodium: 132 mmol/L — ABNORMAL LOW (ref 135–145)

## 2014-11-06 LAB — CBC WITH DIFFERENTIAL/PLATELET
BASOS PCT: 0 % (ref 0–1)
Basophils Absolute: 0 10*3/uL (ref 0.0–0.1)
EOS ABS: 0 10*3/uL (ref 0.0–0.7)
EOS PCT: 0 % (ref 0–5)
HEMATOCRIT: 36.2 % — AB (ref 39.0–52.0)
HEMOGLOBIN: 11.6 g/dL — AB (ref 13.0–17.0)
LYMPHS ABS: 1 10*3/uL (ref 0.7–4.0)
LYMPHS PCT: 6 % — AB (ref 12–46)
MCH: 25.9 pg — ABNORMAL LOW (ref 26.0–34.0)
MCHC: 32 g/dL (ref 30.0–36.0)
MCV: 80.8 fL (ref 78.0–100.0)
MONO ABS: 1.2 10*3/uL — AB (ref 0.1–1.0)
MONOS PCT: 8 % (ref 3–12)
NEUTROS ABS: 12.9 10*3/uL — AB (ref 1.7–7.7)
Neutrophils Relative %: 86 % — ABNORMAL HIGH (ref 43–77)
Platelets: 263 10*3/uL (ref 150–400)
RBC: 4.48 MIL/uL (ref 4.22–5.81)
RDW: 13.9 % (ref 11.5–15.5)
WBC: 15.1 10*3/uL — AB (ref 4.0–10.5)

## 2014-11-06 LAB — TSH: TSH: 1.584 u[IU]/mL (ref 0.350–4.500)

## 2014-11-06 MED ORDER — DEXTROSE 5 % IV SOLN: 1.0000 g | Freq: Once | INTRAVENOUS | Status: DC

## 2014-11-06 MED ORDER — CEFTRIAXONE SODIUM IN DEXTROSE 40 MG/ML IV SOLN
2.0000 g | INTRAVENOUS | Status: DC
  Filled 2014-11-06: qty 50

## 2014-11-06 MED ORDER — ENSURE COMPLETE PO LIQD
237.0000 mL | Freq: Two times a day (BID) | ORAL | Status: DC
  Administered 2014-11-07: 237 mL via ORAL

## 2014-11-06 MED ORDER — METOCLOPRAMIDE HCL 5 MG/ML IJ SOLN
10.0000 mg | Freq: Once | INTRAMUSCULAR | Status: AC
  Administered 2014-11-06: 10 mg via INTRAVENOUS
  Filled 2014-11-06: qty 2

## 2014-11-06 MED ORDER — SODIUM CHLORIDE 0.9 % IV SOLN
INTRAVENOUS | Status: DC
  Administered 2014-11-06 – 2014-11-07 (×2): via INTRAVENOUS

## 2014-11-06 MED ORDER — SODIUM CHLORIDE 0.9 % IV BOLUS (SEPSIS)
1000.0000 mL | Freq: Once | INTRAVENOUS | Status: AC
  Administered 2014-11-06: 1000 mL via INTRAVENOUS

## 2014-11-06 MED ORDER — ADULT MULTIVITAMIN W/MINERALS CH
1.0000 | ORAL_TABLET | Freq: Every day | ORAL | Status: DC
  Administered 2014-11-06 – 2014-11-07 (×2): 1 via ORAL
  Filled 2014-11-06 (×2): qty 1

## 2014-11-06 MED ORDER — ONDANSETRON HCL 4 MG PO TABS: 4.0000 mg | ORAL_TABLET | Freq: Four times a day (QID) | ORAL | Status: DC | PRN

## 2014-11-06 MED ORDER — DEXTROSE 5 % IV SOLN
1.0000 g | Freq: Once | INTRAVENOUS | Status: AC
  Administered 2014-11-06: 1 g via INTRAVENOUS
  Filled 2014-11-06: qty 10

## 2014-11-06 MED ORDER — OXYCODONE HCL 5 MG PO TABS
5.0000 mg | ORAL_TABLET | ORAL | Status: DC | PRN
  Administered 2014-11-07: 5 mg via ORAL
  Filled 2014-11-06: qty 1

## 2014-11-06 MED ORDER — DIPHENHYDRAMINE HCL 50 MG/ML IJ SOLN
25.0000 mg | Freq: Once | INTRAMUSCULAR | Status: AC
  Administered 2014-11-06: 25 mg via INTRAVENOUS
  Filled 2014-11-06: qty 1

## 2014-11-06 MED ORDER — ACETAMINOPHEN 325 MG PO TABS
650.0000 mg | ORAL_TABLET | Freq: Four times a day (QID) | ORAL | Status: DC | PRN
  Administered 2014-11-07: 650 mg via ORAL
  Filled 2014-11-06: qty 2

## 2014-11-06 MED ORDER — MAGNESIUM OXIDE 400 (241.3 MG) MG PO TABS
400.0000 mg | ORAL_TABLET | Freq: Two times a day (BID) | ORAL | Status: DC
  Administered 2014-11-06 – 2014-11-07 (×2): 400 mg via ORAL
  Filled 2014-11-06 (×3): qty 1

## 2014-11-06 MED ORDER — HEPARIN SODIUM (PORCINE) 5000 UNIT/ML IJ SOLN
5000.0000 [IU] | Freq: Three times a day (TID) | INTRAMUSCULAR | Status: DC
  Administered 2014-11-06 – 2014-11-07 (×2): 5000 [IU] via SUBCUTANEOUS
  Filled 2014-11-06 (×3): qty 1

## 2014-11-06 MED ORDER — VITAMIN B-1 100 MG PO TABS
100.0000 mg | ORAL_TABLET | Freq: Every day | ORAL | Status: DC
  Administered 2014-11-06 – 2014-11-07 (×2): 100 mg via ORAL
  Filled 2014-11-06 (×2): qty 1

## 2014-11-06 MED ORDER — SODIUM CHLORIDE 0.9 % IJ SOLN: 3.0000 mL | Freq: Two times a day (BID) | INTRAMUSCULAR | Status: DC

## 2014-11-06 MED ORDER — METOPROLOL TARTRATE 50 MG PO TABS
50.0000 mg | ORAL_TABLET | Freq: Two times a day (BID) | ORAL | Status: DC
  Administered 2014-11-07: 50 mg via ORAL
  Filled 2014-11-06 (×3): qty 1

## 2014-11-06 MED ORDER — ONDANSETRON HCL 4 MG/2ML IJ SOLN: 4.0000 mg | Freq: Four times a day (QID) | INTRAMUSCULAR | Status: DC | PRN

## 2014-11-06 MED ORDER — FOLIC ACID 1 MG PO TABS
1.0000 mg | ORAL_TABLET | Freq: Every day | ORAL | Status: DC
  Administered 2014-11-06 – 2014-11-07 (×2): 1 mg via ORAL
  Filled 2014-11-06 (×2): qty 1

## 2014-11-06 MED ORDER — ACETAMINOPHEN 650 MG RE SUPP: 650.0000 mg | Freq: Four times a day (QID) | RECTAL | Status: DC | PRN

## 2014-11-06 NOTE — H&P (Signed)
[facility] History and Physical  TRIGGER FRASIER YJE:[id number] DOB: 1980/12/23 DOA: 11/06/2014  Referring physician: Debby Freiberg, MD PCP: Elyn Peers, MD   Chief Complaint: Headache  HPI: [name] is a 34 y.o. male presents with headache. Patient was recently discharged from the hospital after he had presented with altered mental status after having a GI bug. Patient was admitted to the PCCM service at the time.; He was treated for sepsis and was basically found to have a urinary source. Patient was discharged on 10/30/14 and had been doing fine at home. He now has been complaining about having a headache. Since the patient is mentally challenged it is difficult to discern an exact location only he states that his head is killing him though he looks fairly comfortable. He has no fever noted here. He has no neck pain noted. Patient does have a history of spina bifida and also has a history of ventricular shunt. In discussing the case with ED they have asked for a Neurosurgery consult to assess his shunt and possibly do a LP at this time. Patient also does have fairly dirty urine and also has had an increasing WBC which is of concern at this time.   Review of Systems Patient is not able to provide a ROS  Past Medical History  Diagnosis Date  . Hypertension   . GERD (gastroesophageal reflux disease)   . Non-healing surgical wound     ABDOMINE  . 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 Arnold-Chiari malformation   . Neurogenic bladder     INCONTINENT -  . 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 Ileostomy in place   . Wheelchair bound     CAN TRASFER SELF WITHOUT BOARD  . Urinary incontinence with continuous leakage   . Wears glasses   . Pressure ulcer, buttock   . Borderline diabetes     DIET CONTROLLED  . Heart palpitations     SECONDARY TO VP SHUNT DRAIN  . History  of seizure as newborn     NO MEDS SINCE 34 YRS OLD--  NO ISSUES SINCE  . Neurogenic bowel   . At risk for sleep apnea     STOP-BANG = 4   SENT TO PCP 11-01-2013   Past Surgical History  Procedure Laterality Date  . Ventriculoperitoneal shunt  LAST REVISION NOV 2014    DUE TO ABD WOUND--- SHUNT DRAINS TO HEART  . Hip surgery Bilateral AS CHILD    TENDON RELEASE  . Cholecystectomy N/A 07/06/2013    Procedure: LAPAROSCOPIC CHOLECYSTECTOMY WITH INTRAOPERATIVE CHOLANGIOGRAM;  Surgeon: Madilyn Hook, DO;  Location: [facility];  Service: General;  Laterality: N/A;  . Colon resection N/A 07/08/2013    Procedure: EXPLORATORY LAPAROSCOPY, DIAGNOSTIC LAPAROSCOPY, PARTIAL COLECTOMY, ABDOMINAL WASHOUT, ABDOMINAL WOUND VAC;  Surgeon: Madilyn Hook, DO;  Location: [facility];  Service: General;  Laterality: N/A;  . Spinal fixation surgery w/ implant  AS CHILD    H[facility] RODS  . 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 Umbilical hernia repair  03-30-2007  . Right colectomy/  appendectomy/  ileostomy  NOV 2014  [facility]  . Incision and drainage of wound N/A 11/04/2013    Procedure: IRRIGATION AND DEBRIDEMENT ABDOMINAL WOUND WITH PLACEMENT OF ACELL/VAC;  Surgeon: Theodoro Kos, DO;  Location: [place];  Service: Plastics;  Laterality: N/A;  Social History:  reports that he has never smoked. He has never used smokeless tobacco. He reports that he does not drink alcohol or use illicit drugs.  Allergies  Allergen Reactions  . Latex Hives  . Adhesive [Tape] Hives  . Ciprofloxacin Other (See Comments)    IV only-- caused burning in arm   . Vancomycin Itching and Swelling    No family history on file.   Prior to Admission medications   Medication Sig Start Date End Date Taking? Authorizing Provider  acetaminophen (TYLENOL) 650 MG CR tablet Take 1,300 mg by mouth every 8 (eight) hours as needed for pain.   Yes Historical Provider, MD  cephALEXin (KEFLEX) 500 MG capsule Take 1  capsule (500 mg total) by mouth 4 (four) times daily. 10/30/14 11/08/14 Yes Reyne Dumas, MD  feeding supplement, ENSURE COMPLETE, (ENSURE COMPLETE) LIQD Take 237 mLs by mouth 2 (two) times daily between meals. 10/30/14  Yes Reyne Dumas, MD  magnesium oxide (MAG-OX) 400 MG tablet Take 1 tablet (400 mg total) by mouth 2 (two) times daily. 10/30/14  Yes Reyne Dumas, MD  metoprolol (LOPRESSOR) 50 MG tablet Take 1 tablet (50 mg total) by mouth 2 (two) times daily. 11/03/14  Yes Reyne Dumas, MD  Multiple Vitamin (MULTIVITAMIN) tablet Take 1 tablet by mouth daily.   Yes Historical Provider, MD  ondansetron (ZOFRAN) 4 MG tablet Take 4 mg by mouth every 8 (eight) hours as needed for nausea or vomiting.   Yes Historical Provider, MD  oxyCODONE (OXY IR/ROXICODONE) 5 MG immediate release tablet Take 1 tablet (5 mg total) by mouth every 6 (six) hours as needed. 1 by mouth every 3 hours as needed for MILD pain, take 2 by mouth every 3 hours as needed for MODERATE pain, 3 by mouth every 3 hours as needed for SEVERE pain 10/30/14  Yes Reyne Dumas, MD  potassium chloride 20 MEQ TBCR Take 20 mEq by mouth daily. 10/30/14  Yes Reyne Dumas, MD   Physical Exam: Filed Vitals:   11/06/14 1726 11/06/14 1745 11/06/14 1815 11/06/14 1830  BP:  97/53 102/63 102/63  Pulse:  73 63 75  Temp: 96.8 F (36 C)     TempSrc: Rectal     Resp:    20  SpO2:  100% 100% 100%    Wt Readings from Last 3 Encounters:  10/29/14 82.774 kg (182 lb 7.8 oz)  11/04/13 75.297 kg (166 lb)  07/08/13 104.4 kg (230 lb 2.6 oz)    General:  Appears calm and comfortable Eyes: PERRL, normal lids, irises & conjunctiva ENT: grossly normal hearing, lips & tongue Neck: no LAD, masses or thyromegaly Cardiovascular: RRR, no m/r/g Respiratory: CTA bilaterally, no w/r/r. Normal respiratory effort. Abdomen: soft, has colostomy in place draining liquid which is dark Skin: no rash or induration seen on limited exam Musculoskeletal: atrophic  LE Psychiatric: grossly normal Neurologic: limited exam is unremarkable          Labs on Admission:  Basic Metabolic Panel:  Recent Labs Lab 11/06/14 1530  NA 132*  K 4.3  CL 86*  CO2 30  GLUCOSE 114*  BUN 32*  CREATININE 1.03  CALCIUM 9.6   Liver Function Tests: No results for input(s): AST, ALT, ALKPHOS, BILITOT, PROT, ALBUMIN in the last 168 hours. No results for input(s): LIPASE, AMYLASE in the last 168 hours. No results for input(s): AMMONIA in the last 168 hours. CBC:  Recent Labs Lab 11/06/14 1530  WBC 15.1*  NEUTROABS 12.9*  HGB 11.6*  HCT 36.2*  MCV 80.8  PLT 263   Cardiac Enzymes: No results for input(s): CKTOTAL, CKMB, CKMBINDEX, TROPONINI in the last 168 hours.  BNP (last 3 results) No results for input(s): BNP in the last 8760 hours.  ProBNP (last 3 results) No results for input(s): PROBNP in the last 8760 hours.  CBG: No results for input(s): GLUCAP in the last 168 hours.  Radiological Exams on Admission: Dg Cervical Spine 2-3 Views  11/06/2014   CLINICAL DATA:  Neck pain for 1 week without known injury. Initial encounter.  EXAM: CERVICAL SPINE - 2-3 VIEW  COMPARISON:  None.  FINDINGS: Due to body habitus, only the first 4 cervical vertebra are completely visualized. No fracture or spondylolisthesis is seen within the visualized cervical spine.  IMPRESSION: Only first 5 cervical vertebra are completely visualized due to body habitus. No definite abnormality seen within the visualized cervical spine.   Electronically Signed   By: Marijo Conception, M.D.   On: 11/06/2014 15:19   Ct Head Wo Contrast  11/06/2014   CLINICAL DATA:  Severe BILATERAL temporal headaches, history VP shunt, spina bifida, hypertension, Arnold-Chiari malformation  EXAM: CT HEAD WITHOUT CONTRAST  TECHNIQUE: Contiguous axial images were obtained from the base of the skull through the vertex without intravenous contrast.  COMPARISON:  10/27/2014  FINDINGS: Ventriculoperitoneal shunt  via posterior RIGHT parietal approach with tip at the anterior aspect of the LEFT lateral ventricle.  Diffuse ventriculomegaly of the lateral and third ventricles again identified, little changed.  No midline shift or mass effect.  Agenesis of the corpus callosum.  Encephalomalacia surrounding the VP shunt catheter.  Periventricular white matter hypoattenuation stable.  Calcification at the posterior falx unchanged.  No intracranial hemorrhage mass lesion, or evidence acute infarction.  No extra-axial fluid collections.  Descent of cerebellar tonsils into foramen magnum.  Bones and sinuses unremarkable.  IMPRESSION: Persistent dilatation of a lateral and third ventricles little changed versus 10/27/2014 exam.  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   Electronically Signed   By: Lavonia Dana M.D.   On: 11/06/2014 15:57   Dg Abd Acute W/chest  11/06/2014   CLINICAL DATA:  spinabifida.  Hydrocephalus.  Evaluate VP shunt  EXAM: ACUTE ABDOMEN SERIES (ABDOMEN 2 VIEW & CHEST 1 VIEW)  COMPARISON:  None.  FINDINGS: Scoliosis rods are identified within the thoracic, lumbar spine and sacrum. The lung volumes appear low. No airspace opacities identified. There are at least two shunt catheters identified within the right side of chest. One of these appears chronically fractured. The second shunt catheter is identified within the right side of neck and chest and appears to terminate in the projection of the lower thoracic spine. I cannot confidently identify the distal tip due to the overlap of bony structures and spinal hardware.  IMPRESSION: 1. Right sided ventriculoperitoneal shunt catheter is are again noted. The non fractured presumably working catheter within the right side of neck and chest is again identified. However, the tip of this catheter is in the projection of the lower thoracic spine and spinal hardware an I cannot confidently identify its  location.   Electronically Signed   By: Kerby Moors M.D.   On: 11/06/2014 17:25      Assessment/Plan Active Problems:   Essential hypertension, benign   Anemia in chronic renal disease   UTI (lower urinary tract infection)   H/O ventricular shunt   Headache   1. Headache -history is  not clear cut due to patient cognitive issues -neurosurgery will be seeing the patient -may need a LP -unlikely this is meningitis  2. Anemia -will get iron studies  3. UTI -patient started on Rocephin -will follow cultures  4. Ventricular shunt -Neurosurgery to assess  5. HTN -will monitor pressures   Code Status: Full Code (must indicate code status--if unknown or must be presumed, indicate so) DVT Prophylaxis:Heparin Family Communication: Mother (indicate person spoken with, if applicable, with phone number if by telephone) Disposition Plan: Home (indicate anticipated LOS)  Time spent: 80min  Hennessy Bartel A [facility] Pager [phone]

## 2014-11-06 NOTE — ED Notes (Signed)
Pt states he started having serve headache last night. Was here on the 14th for headache, pt has shunt in brain and was checked on 14th per mother. Denies any blurry vison, weakness. Pt is alert and ox4.

## 2014-11-06 NOTE — Progress Notes (Signed)
Called ED RN for report. Room ready for admission. 

## 2014-11-06 NOTE — ED Notes (Signed)
Pt in CT.

## 2014-11-06 NOTE — ED Notes (Signed)
Per [facility], pt from home, pt hx of spina bifida, and hydrocephalus, pt has shunt. Pt is used to having headaches, was seen last week at [facility] and sent here for shunt specialist, dx with UTI, headaches have not gotten better since then. No neuro deficits, VSS. Pt is AAOX4.

## 2014-11-06 NOTE — ED Notes (Signed)
MD at bedside. 

## 2014-11-06 NOTE — ED Notes (Signed)
Family at bedside. 

## 2014-11-06 NOTE — ED Notes (Signed)
Report attempted 

## 2014-11-06 NOTE — ED Provider Notes (Signed)
CSN: [id number]     Arrival date & time 11/06/14  1419 History   First MD Initiated Contact with Patient 11/06/14 1420     Chief Complaint  Patient presents with  . Headache     (Consider location/radiation/quality/duration/timing/severity/associated sxs/prior Treatment) Patient is a 34 y.o. male presenting with headaches.  Headache Pain location:  Generalized Quality:  Sharp Radiates to:  Does not radiate Onset quality:  Gradual Duration:  1 day Timing:  Constant Progression:  Unchanged Chronicity:  Recurrent Similar to prior headaches: yes   Context: activity and bright light   Relieved by:  Nothing Worsened by:  Light Associated symptoms: no blurred vision   Risk factors comment:  Has shunt for hydrocephalus   Past Medical History  Diagnosis Date  . Hypertension   . GERD (gastroesophageal reflux disease)   . Non-healing surgical wound     ABDOMINE  . 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 Arnold-Chiari malformation   . Neurogenic bladder     INCONTINENT -  . 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 Ileostomy in place   . Wheelchair bound     CAN TRASFER SELF WITHOUT BOARD  . Urinary incontinence with continuous leakage   . Wears glasses   . Pressure ulcer, buttock   . Borderline diabetes     DIET CONTROLLED  . Heart palpitations     SECONDARY TO VP SHUNT DRAIN  . History of seizure as newborn     NO MEDS SINCE 34 YRS OLD--  NO ISSUES SINCE  . Neurogenic bowel   . At risk for sleep apnea     STOP-BANG = 4   SENT TO PCP 11-01-2013   Past Surgical History  Procedure Laterality Date  . Ventriculoperitoneal shunt  LAST REVISION NOV 2014    DUE TO ABD WOUND--- SHUNT DRAINS TO HEART  . Hip surgery Bilateral AS CHILD    TENDON RELEASE  . Cholecystectomy N/A 07/06/2013    Procedure: LAPAROSCOPIC CHOLECYSTECTOMY WITH INTRAOPERATIVE CHOLANGIOGRAM;  Surgeon: Madilyn Hook, DO;  Location: [facility];  Service: General;  Laterality: N/A;  . Colon resection N/A 07/08/2013    Procedure: EXPLORATORY LAPAROSCOPY, DIAGNOSTIC LAPAROSCOPY, PARTIAL COLECTOMY, ABDOMINAL WASHOUT, ABDOMINAL WOUND VAC;  Surgeon: Madilyn Hook, DO;  Location: [facility];  Service: General;  Laterality: N/A;  . Spinal fixation surgery w/ implant  AS CHILD    [facility] RODS  . 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 Umbilical hernia repair  03-30-2007  . Right colectomy/  appendectomy/  ileostomy  NOV 2014  [facility]  . Incision and drainage of wound N/A 11/04/2013    Procedure: IRRIGATION AND DEBRIDEMENT ABDOMINAL WOUND WITH PLACEMENT OF ACELL/VAC;  Surgeon: Theodoro Kos, DO;  Location: [place];  Service: Plastics;  Laterality: N/A;   No family history on file. History  Substance Use Topics  . Smoking status: Never Smoker   . Smokeless tobacco: Never Used  . Alcohol Use: No    Review of Systems  Eyes: Negative for blurred vision.  Neurological: Positive for headaches.  All other systems reviewed and are negative.     Allergies  Latex; Adhesive; Ciprofloxacin; and Vancomycin  Home Medications   Prior to Admission medications   Medication Sig Start Date End Date Taking? Authorizing Provider  acetaminophen (TYLENOL) 650 MG CR tablet Take 1,300 mg by mouth  every 8 (eight) hours as needed for pain.   Yes Historical Provider, MD  cephALEXin (KEFLEX) 500 MG capsule Take 1 capsule (500 mg total) by mouth 4 (four) times daily. 10/30/14 11/08/14 Yes Reyne Dumas, MD  feeding supplement, ENSURE COMPLETE, (ENSURE COMPLETE) LIQD Take 237 mLs by mouth 2 (two) times daily between meals. 10/30/14  Yes Reyne Dumas, MD  magnesium oxide (MAG-OX) 400 MG tablet Take 1 tablet (400 mg total) by mouth 2 (two) times daily. 10/30/14  Yes Reyne Dumas, MD  metoprolol (LOPRESSOR) 50 MG tablet Take 1 tablet (50 mg total) by mouth 2 (two) times daily. 11/03/14  Yes Reyne Dumas,  MD  Multiple Vitamin (MULTIVITAMIN) tablet Take 1 tablet by mouth daily.   Yes Historical Provider, MD  ondansetron (ZOFRAN) 4 MG tablet Take 4 mg by mouth every 8 (eight) hours as needed for nausea or vomiting.   Yes Historical Provider, MD  oxyCODONE (OXY IR/ROXICODONE) 5 MG immediate release tablet Take 1 tablet (5 mg total) by mouth every 6 (six) hours as needed. 1 by mouth every 3 hours as needed for MILD pain, take 2 by mouth every 3 hours as needed for MODERATE pain, 3 by mouth every 3 hours as needed for SEVERE pain 10/30/14  Yes Reyne Dumas, MD  potassium chloride 20 MEQ TBCR Take 20 mEq by mouth daily. 10/30/14  Yes Reyne Dumas, MD   BP 102/49 mmHg  Pulse 78  Temp(Src) 96.8 F (36 C) (Rectal)  Resp 13  SpO2 100% Physical Exam  Constitutional: He appears well-developed and well-nourished.  HENT:  Head: Normocephalic and atraumatic.  Eyes: Conjunctivae and EOM are normal.  Neck: Normal range of motion. Neck supple.  Cardiovascular: Normal rate, regular rhythm and normal heart sounds.   Pulmonary/Chest: Effort normal and breath sounds normal. No respiratory distress.  Abdominal: He exhibits no distension. There is no tenderness. There is no rebound and no guarding.  Musculoskeletal: Normal range of motion.  Neurological: He is alert.  Oriented to person and place, not date.  Paraplegia  Skin: Skin is warm and dry.  Stage 2 decub ulcer, no surrounding cellulitis.  Skin drying of groin, no mucous membrane desquamation  Vitals reviewed.   ED Course  Procedures (including critical care time) Labs Review Labs Reviewed  CBC WITH DIFFERENTIAL/PLATELET - Abnormal; Notable for the following:    WBC 15.1 (*)    Hemoglobin 11.6 (*)    HCT 36.2 (*)    MCH 25.9 (*)    Neutrophils Relative % 86 (*)    Neutro Abs 12.9 (*)    Lymphocytes Relative 6 (*)    Monocytes Absolute 1.2 (*)    All other components within normal limits  BASIC METABOLIC PANEL - Abnormal; Notable for the  following:    Sodium 132 (*)    Chloride 86 (*)    Glucose, Bld 114 (*)    BUN 32 (*)    Anion gap 16 (*)    All other components within normal limits  URINALYSIS, ROUTINE W REFLEX MICROSCOPIC - Abnormal; Notable for the following:    APPearance CLOUDY (*)    Hgb urine dipstick TRACE (*)    Protein, ur 30 (*)    Leukocytes, UA MODERATE (*)    All other components within normal limits  URINE MICROSCOPIC-ADD ON - Abnormal; Notable for the following:    Bacteria, UA FEW (*)    Casts HYALINE CASTS (*)    All other components within normal limits  URINE CULTURE  Imaging Review Dg Cervical Spine 2-3 Views  11/06/2014   CLINICAL DATA:  Neck pain for 1 week without known injury. Initial encounter.  EXAM: CERVICAL SPINE - 2-3 VIEW  COMPARISON:  None.  FINDINGS: Due to body habitus, only the first 4 cervical vertebra are completely visualized. No fracture or spondylolisthesis is seen within the visualized cervical spine.  IMPRESSION: Only first 5 cervical vertebra are completely visualized due to body habitus. No definite abnormality seen within the visualized cervical spine.   Electronically Signed   By: Marijo Conception, M.D.   On: 11/06/2014 15:19   Ct Head Wo Contrast  11/06/2014   CLINICAL DATA:  Severe BILATERAL temporal headaches, history VP shunt, spina bifida, hypertension, Arnold-Chiari malformation  EXAM: CT HEAD WITHOUT CONTRAST  TECHNIQUE: Contiguous axial images were obtained from the base of the skull through the vertex without intravenous contrast.  COMPARISON:  10/27/2014  FINDINGS: Ventriculoperitoneal shunt via posterior RIGHT parietal approach with tip at the anterior aspect of the LEFT lateral ventricle.  Diffuse ventriculomegaly of the lateral and third ventricles again identified, little changed.  No midline shift or mass effect.  Agenesis of the corpus callosum.  Encephalomalacia surrounding the VP shunt catheter.  Periventricular white matter hypoattenuation stable.   Calcification at the posterior falx unchanged.  No intracranial hemorrhage mass lesion, or evidence acute infarction.  No extra-axial fluid collections.  Descent of cerebellar tonsils into foramen magnum.  Bones and sinuses unremarkable.  IMPRESSION: Persistent dilatation of a lateral and third ventricles little changed versus 10/27/2014 exam.  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   Electronically Signed   By: Lavonia Dana M.D.   On: 11/06/2014 15:57   Dg Abd Acute W/chest  11/06/2014   CLINICAL DATA:  spinabifida.  Hydrocephalus.  Evaluate VP shunt  EXAM: ACUTE ABDOMEN SERIES (ABDOMEN 2 VIEW & CHEST 1 VIEW)  COMPARISON:  None.  FINDINGS: Scoliosis rods are identified within the thoracic, lumbar spine and sacrum. The lung volumes appear low. No airspace opacities identified. There are at least two shunt catheters identified within the right side of chest. One of these appears chronically fractured. The second shunt catheter is identified within the right side of neck and chest and appears to terminate in the projection of the lower thoracic spine. I cannot confidently identify the distal tip due to the overlap of bony structures and spinal hardware.  IMPRESSION: 1. Right sided ventriculoperitoneal shunt catheter is are again noted. The non fractured presumably working catheter within the right side of neck and chest is again identified. However, the tip of this catheter is in the projection of the lower thoracic spine and spinal hardware an I cannot confidently identify its location.   Electronically Signed   By: Kerby Moors M.D.   On: 11/06/2014 17:25     EKG Interpretation None      MDM   Final diagnoses:  Headache    34 y.o. male with pertinent PMH of hydrocephalus, spina bifida, recent admission for urosepsis presents with recurrent ha, ? Ams.  Symptoms similar, but milder to reasons for admission on prior.  On arrival today,  pt is at his baseline per mother.  No fevers.  BP borderline low.  UA with signs of infection, and pt has been on keflex.  HA improved transiently with reglan/benadryl.  WBC increasing despite abx.  Consulted medicine for admission.  Also spoke with [facility] who recommended no acute therapy, ok to  treat empirically and that the shunt was unlikely the cause of the issue, however recommended calling Dr. Ronnald Ramp in the AM since he was consulting during last admission and if symptoms were to worsen they would consider shunt tap, however Dr. Arnoldo Morale did not recommend tap at this time.  Admitted after rocephin, meningitis dose.    I have reviewed all laboratory and imaging studies if ordered as above  1. Headache         Debby Freiberg, MD 11/06/14 [id number]

## 2014-11-06 NOTE — Progress Notes (Addendum)
ANTIBIOTIC CONSULT NOTE - INITIAL  Pharmacy Consult for rocephin Indication: UTI  Allergies  Allergen Reactions  . Latex Hives  . Adhesive [Tape] Hives  . Ciprofloxacin Other (See Comments)    IV only-- caused burning in arm   . Vancomycin Itching and Swelling     Vital Signs: Temp: 96.8 F (36 C) (02/25 1726) Temp Source: Rectal (02/25 1726) BP: 102/47 mmHg (02/25 1915) Pulse Rate: 78 (02/25 1915) Intake/Output from previous day:   Intake/Output from this shift:    Labs:  Recent Labs  11/06/14 1530  WBC 15.1*  HGB 11.6*  PLT 263  CREATININE 1.03   Estimated Creatinine Clearance: 98.9 mL/min (by C-G formula based on Cr of 1.03). No results for input(s): VANCOTROUGH, VANCOPEAK, VANCORANDOM, GENTTROUGH, GENTPEAK, GENTRANDOM, TOBRATROUGH, TOBRAPEAK, TOBRARND, AMIKACINPEAK, AMIKACINTROU, AMIKACIN in the last 72 hours.   Microbiology: Recent Results (from the past 720 hour(s))  Blood culture (routine x 2)     Status: None   Collection Time: 10/25/14  9:00 PM  Result Value Ref Range Status   Specimen Description BLOOD RIGHT ANTECUBITAL  Final   Special Requests BOTTLES DRAWN AEROBIC ONLY 5CC  Final   Culture   Final    NO GROWTH 5 DAYS Performed at [facility]    Report Status 11/01/2014 FINAL  Final  Blood culture (routine x 2)     Status: None   Collection Time: 10/25/14  9:11 PM  Result Value Ref Range Status   Specimen Description BLOOD RIGHT ANTECUBITAL  Final   Special Requests BOTTLES DRAWN AEROBIC ONLY 5CC  Final   Culture   Final    NO GROWTH 5 DAYS Performed at [facility]    Report Status 11/01/2014 FINAL  Final  Urine culture     Status: None   Collection Time: 10/25/14  9:41 PM  Result Value Ref Range Status   Specimen Description URINE, CATHETERIZED  Final   Special Requests NONE  Final   Colony Count   Final    >=100,000 COLONIES/ML Performed at [facility]    Culture   Final    ESCHERICHIA COLI Performed at  [facility]    Report Status 10/28/2014 FINAL  Final   Organism ID, Bacteria ESCHERICHIA COLI  Final      Susceptibility   Escherichia coli - MIC*    AMPICILLIN >=32 RESISTANT Resistant     CEFAZOLIN <=4 SENSITIVE Sensitive     CEFTRIAXONE <=1 SENSITIVE Sensitive     CIPROFLOXACIN <=0.25 SENSITIVE Sensitive     GENTAMICIN >=16 RESISTANT Resistant     LEVOFLOXACIN <=0.12 SENSITIVE Sensitive     NITROFURANTOIN <=16 SENSITIVE Sensitive     TOBRAMYCIN 8 INTERMEDIATE Intermediate     TRIMETH/SULFA >=320 RESISTANT Resistant     PIP/TAZO <=4 SENSITIVE Sensitive     * ESCHERICHIA COLI  MRSA PCR Screening     Status: None   Collection Time: 10/26/14  1:39 AM  Result Value Ref Range Status   MRSA by PCR NEGATIVE NEGATIVE Final    Comment:        The GeneXpert MRSA Assay (FDA approved for NASAL specimens only), is one component of a comprehensive MRSA colonization surveillance program. It is not intended to diagnose MRSA infection nor to guide or monitor treatment for MRSA infections.   Ova and parasite examination     Status: None   Collection Time: 10/26/14  4:04 AM  Result Value Ref Range Status   Specimen Description STOOL  Final   Special Requests NONE  Final   Ova and parasites   Final    NO OVA OR PARASITES SEEN Performed at [facility]    Report Status 10/28/2014 FINAL  Final  Clostridium Difficile by PCR     Status: None   Collection Time: 10/26/14  4:04 AM  Result Value Ref Range Status   C difficile by pcr NEGATIVE NEGATIVE Final    Medical History: Past Medical History  Diagnosis Date  . Hypertension   . GERD (gastroesophageal reflux disease)   . Non-healing surgical wound     ABDOMINE  . 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 Arnold-Chiari malformation   . Neurogenic bladder     INCONTINENT -  . 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  Ileostomy in place   . Wheelchair bound     CAN TRASFER SELF WITHOUT BOARD  . Urinary incontinence with continuous leakage   . Wears glasses   . Pressure ulcer, buttock   . Borderline diabetes     DIET CONTROLLED  . Heart palpitations     SECONDARY TO VP SHUNT DRAIN  . History of seizure as newborn     NO MEDS SINCE 34 YRS OLD--  NO ISSUES SINCE  . Neurogenic bowel   . At risk for sleep apnea     STOP-BANG = 4   SENT TO PCP 11-01-2013    Medications:  Prescriptions prior to admission  Medication Sig Dispense Refill Last Dose  . acetaminophen (TYLENOL) 650 MG CR tablet Take 1,300 mg by mouth every 8 (eight) hours as needed for pain.   11/05/2014 at Unknown time  . cephALEXin (KEFLEX) 500 MG capsule Take 1 capsule (500 mg total) by mouth 4 (four) times daily. 36 capsule 0 11/06/2014 at Unknown time  . feeding supplement, ENSURE COMPLETE, (ENSURE COMPLETE) LIQD Take 237 mLs by mouth 2 (two) times daily between meals. 1 Bottle 0 11/05/2014 at Unknown time  . magnesium oxide (MAG-OX) 400 MG tablet Take 1 tablet (400 mg total) by mouth 2 (two) times daily. 120 tablet 0 11/05/2014 at Unknown time  . metoprolol (LOPRESSOR) 50 MG tablet Take 1 tablet (50 mg total) by mouth 2 (two) times daily. 60 tablet 0 11/06/2014 at 0900  . Multiple Vitamin (MULTIVITAMIN) tablet Take 1 tablet by mouth daily.   11/05/2014 at Unknown time  . ondansetron (ZOFRAN) 4 MG tablet Take 4 mg by mouth every 8 (eight) hours as needed for nausea or vomiting.   2-3 days  . oxyCODONE (OXY IR/ROXICODONE) 5 MG immediate release tablet Take 1 tablet (5 mg total) by mouth every 6 (six) hours as needed. 1 by mouth every 3 hours as needed for MILD pain, take 2 by mouth every 3 hours as needed for MODERATE pain, 3 by mouth every 3 hours as needed for SEVERE pain 30 tablet 0 11/06/2014 at 1200  . potassium chloride 20 MEQ TBCR Take 20 mEq by mouth daily. 30 tablet 0 11/05/2014 at Unknown time   Scheduled:  . cefTRIAXone (ROCEPHIN)  IV  1 g  Intravenous Once  . [START ON 11/07/2014] feeding supplement (ENSURE COMPLETE)  237 mL Oral BID BM  . folic acid  1 mg Oral Daily  . heparin  5,000 Units Subcutaneous 3 times per day  . magnesium oxide  400 mg Oral BID  .  metoprolol  50 mg Oral BID  . multivitamin with minerals  1 tablet Oral Daily  . sodium chloride  3 mL Intravenous Q12H  . thiamine  100 mg Oral Daily     Assessment: 34 yo male here with headache with history of spina bifida and ventricular shunt. Pharmacy has been consulted to dose ceftriaxone for UTI and possible meningeal coverage. He is noted with a recent E.coli UTI (10/25/14) that was sensitive to rocephin.   -Rocephin 1gm IV was given in the ED at about 7pm today  2/25 rocephin>>  2/25 urine  Plan:  -Rocephin 2gm IV q24h -Will follow renal function, cultures and clinical progress  Hildred Laser, Pharm D 11/06/2014 8:19 PM

## 2014-11-07 DIAGNOSIS — Z982 Presence of cerebrospinal fluid drainage device: Secondary | ICD-10-CM

## 2014-11-07 DIAGNOSIS — R51 Headache: Secondary | ICD-10-CM

## 2014-11-07 DIAGNOSIS — N189 Chronic kidney disease, unspecified: Secondary | ICD-10-CM

## 2014-11-07 LAB — CBC
HCT: 30.6 % — ABNORMAL LOW (ref 39.0–52.0)
HEMOGLOBIN: 9.6 g/dL — AB (ref 13.0–17.0)
MCH: 25.3 pg — ABNORMAL LOW (ref 26.0–34.0)
MCHC: 31.4 g/dL (ref 30.0–36.0)
MCV: 80.7 fL (ref 78.0–100.0)
Platelets: 313 10*3/uL (ref 150–400)
RBC: 3.79 MIL/uL — ABNORMAL LOW (ref 4.22–5.81)
RDW: 13.9 % (ref 11.5–15.5)
WBC: 9.2 10*3/uL (ref 4.0–10.5)

## 2014-11-07 LAB — FERRITIN: Ferritin: 108 ng/mL (ref 22–322)

## 2014-11-07 LAB — COMPREHENSIVE METABOLIC PANEL
ALBUMIN: 3.2 g/dL — AB (ref 3.5–5.2)
ALT: 90 U/L — ABNORMAL HIGH (ref 0–53)
ANION GAP: 11 (ref 5–15)
AST: 64 U/L — ABNORMAL HIGH (ref 0–37)
Alkaline Phosphatase: 121 U/L — ABNORMAL HIGH (ref 39–117)
BILIRUBIN TOTAL: 0.4 mg/dL (ref 0.3–1.2)
BUN: 19 mg/dL (ref 6–23)
CHLORIDE: 91 mmol/L — AB (ref 96–112)
CO2: 30 mmol/L (ref 19–32)
Calcium: 8.8 mg/dL (ref 8.4–10.5)
Creatinine, Ser: 0.74 mg/dL (ref 0.50–1.35)
GLUCOSE: 92 mg/dL (ref 70–99)
POTASSIUM: 3.5 mmol/L (ref 3.5–5.1)
Sodium: 132 mmol/L — ABNORMAL LOW (ref 135–145)
Total Protein: 6.8 g/dL (ref 6.0–8.3)

## 2014-11-07 LAB — IRON AND TIBC
IRON: 52 ug/dL (ref 42–165)
Saturation Ratios: 13 % — ABNORMAL LOW (ref 20–55)
TIBC: 397 ug/dL (ref 215–435)
UIBC: 345 ug/dL (ref 125–400)

## 2014-11-07 MED ORDER — DEXTROSE 5 % IV SOLN: 1.0000 g | Freq: Once | INTRAVENOUS | Status: DC

## 2014-11-07 NOTE — Discharge Summary (Signed)
Physician Discharge Summary  [name] MRN: [id number] DOB/AGE: 1980-11-06 34 y.o.  PCP: Elyn Peers, MD   Admit date: 11/06/2014 Discharge date: 11/07/2014  Discharge Diagnoses:      Essential hypertension, benign   Anemia in chronic renal disease   UTI (lower urinary tract infection)   H/O ventricular shunt   Headache  Patient being transferred to [facility], to the service of Dr. Angelene Giovanni, neurosurgery replacement of his VP shunt    Medication List    STOP taking these medications        cephALEXin 500 MG capsule  Commonly known as:  KEFLEX      TAKE these medications        acetaminophen 650 MG CR tablet  Commonly known as:  TYLENOL  Take 1,300 mg by mouth every 8 (eight) hours as needed for pain.     cefTRIAXone 1 g in dextrose 5 % 50 mL  Inject 1 g into the vein once.     feeding supplement (ENSURE COMPLETE) Liqd  Take 237 mLs by mouth 2 (two) times daily between meals.     magnesium oxide 400 MG tablet  Commonly known as:  MAG-OX  Take 1 tablet (400 mg total) by mouth 2 (two) times daily.     metoprolol 50 MG tablet  Commonly known as:  LOPRESSOR  Take 1 tablet (50 mg total) by mouth 2 (two) times daily.     multivitamin tablet  Take 1 tablet by mouth daily.     ondansetron 4 MG tablet  Commonly known as:  ZOFRAN  Take 4 mg by mouth every 8 (eight) hours as needed for nausea or vomiting.     oxyCODONE 5 MG immediate release tablet  Commonly known as:  Oxy IR/ROXICODONE  Take 1 tablet (5 mg total) by mouth every 6 (six) hours as needed. 1 by mouth every 3 hours as needed for MILD pain, take 2 by mouth every 3 hours as needed for MODERATE pain, 3 by mouth every 3 hours as needed for SEVERE pain     Potassium Chloride ER 20 MEQ Tbcr  Take 20 mEq by mouth daily.        Discharge Condition:    Disposition: 01-Home or Self Care   Consults:  None   Significant Diagnostic Studies: Dg Cervical Spine 2-3  Views  11/06/2014   CLINICAL DATA:  Neck pain for 1 week without known injury. Initial encounter.  EXAM: CERVICAL SPINE - 2-3 VIEW  COMPARISON:  None.  FINDINGS: Due to body habitus, only the first 4 cervical vertebra are completely visualized. No fracture or spondylolisthesis is seen within the visualized cervical spine.  IMPRESSION: Only first 5 cervical vertebra are completely visualized due to body habitus. No definite abnormality seen within the visualized cervical spine.   Electronically Signed   By: Marijo Conception, M.D.   On: 11/06/2014 15:19   Dg Cervical Spine 2-3 Views  10/25/2014   CLINICAL DATA:  34 year old male VP shunt. Headache. Initial encounter.  EXAM: CERVICAL SPINE - 2-3 VIEW  COMPARISON:  Chest CTA 07/08/2013.  FINDINGS: Large body habitus. Shunt tubing tracks inferiorly in the right neck can't into the chest. There is a blind ending component of tubing suspected. Upper thoracic spinal rods. No acute osseous abnormality identified.  IMPRESSION: Two shunt catheters course inferiorly from the right scalp, 1 appears blind ending. The second continues into the chest. Large body habitus.   Electronically Signed  By: Genevie Ann M.D.   On: 10/25/2014 20:58   Ct Head Wo Contrast  11/06/2014   CLINICAL DATA:  Severe BILATERAL temporal headaches, history VP shunt, spina bifida, hypertension, Arnold-Chiari malformation  EXAM: CT HEAD WITHOUT CONTRAST  TECHNIQUE: Contiguous axial images were obtained from the base of the skull through the vertex without intravenous contrast.  COMPARISON:  10/27/2014  FINDINGS: Ventriculoperitoneal shunt via posterior RIGHT parietal approach with tip at the anterior aspect of the LEFT lateral ventricle.  Diffuse ventriculomegaly of the lateral and third ventricles again identified, little changed.  No midline shift or mass effect.  Agenesis of the corpus callosum.  Encephalomalacia surrounding the VP shunt catheter.  Periventricular white matter hypoattenuation stable.   Calcification at the posterior falx unchanged.  No intracranial hemorrhage mass lesion, or evidence acute infarction.  No extra-axial fluid collections.  Descent of cerebellar tonsils into foramen magnum.  Bones and sinuses unremarkable.  IMPRESSION: Persistent dilatation of a lateral and third ventricles little changed versus 10/27/2014 exam.  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   Electronically Signed   By: Lavonia Dana M.D.   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 Hospital admission for sepsis.  EXAM: CT HEAD WITHOUT CONTRAST  TECHNIQUE: Contiguous axial images were obtained from the base of the skull through the vertex without intravenous contrast.  COMPARISON:  CT of the head October 25, 2014  FINDINGS: Ventriculoperitoneal shunt via RIGHT parietal burr hole, distal tip in the frontal horn of LEFT lateral ventricle. Again noted is irregular configuration of the ventricles with steer horn appearance consistent with agenesis of the corpus callosum. Patchy periventricular white matter hypodensities, unchanged. Poor visualization of the sulci, which may in part reflect sequelae of hydrocephalus and congenital in etiology, motion. No intraparenchymal hemorrhage, midline shift. No acute large vascular territory infarct. Featureless posterior fossa with cerebellar tonsillar descent below the foramen magnum consistent with provided history.  Mild paranasal sinus mucosal thickening without air-fluid levels. The mastoid air cells are well aerated.  IMPRESSION: Stable appearance of ventriculoperitoneal shunt with distal tip at LEFT frontal horn of lateral ventricle. Confluent periventricular RIGHT matter hypodensities may reflect interstitial edema, recommend correlation with shunt pressures.  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   Electronically Signed   By: Elon Alas   On: 10/27/2014 05:28   Ct Head Wo Contrast  10/25/2014   CLINICAL DATA:  34 year old male with VP shunt and headache. Initial encounter.  EXAM: CT HEAD WITHOUT CONTRAST  TECHNIQUE: Contiguous axial images were obtained from the base of the skull through the vertex without intravenous contrast.  COMPARISON:  Report of the head CT 02/21/2001 (no images available).  FINDINGS: Visualized paranasal sinuses and mastoids are clear. Right posterior approach shunt catheter with burr hole. Hyperostosis of the calvarium. No acute osseous abnormality identified. Visualized orbit soft tissues are within normal limits.  Right posterior scalp shunt reservoir. Two separate shunt catheters continue inferiorly.  The internal component of the ventriculostomy shunt terminates at the left frontal horn. Dysplastic ventricles. Ventriculomegaly including enlarged temporal horns. Mild periventricular hypodensity compatible with transependymal edema. Effaced basilar cisterns including the cisterna magna. No acute intracranial hemorrhage identified. No extra-axial collection identified. No evidence of cortically based acute infarction identified.  IMPRESSION: 1. Dysplastic ventricles with evidence of acute ventriculomegaly with some transependymal  edema. 2. Effaced basilar cisterns including the cisterna magna. 3. Right posterior approach shunt catheter, tip at the left frontal horn.  Study discussed by telephone with Dr. Shirlyn Goltz on 10/25/2014 at 21:01.   Electronically Signed   By: Genevie Ann M.D.   On: 10/25/2014 21:04   Dg Abd Acute W/chest  11/06/2014   CLINICAL DATA:  spinabifida.  Hydrocephalus.  Evaluate VP shunt  EXAM: ACUTE ABDOMEN SERIES (ABDOMEN 2 VIEW & CHEST 1 VIEW)  COMPARISON:  None.  FINDINGS: Scoliosis rods are identified within the thoracic, lumbar spine and sacrum. The lung volumes appear low. No airspace opacities identified. There are at least  two shunt catheters identified within the right side of chest. One of these appears chronically fractured. The second shunt catheter is identified within the right side of neck and chest and appears to terminate in the projection of the lower thoracic spine. I cannot confidently identify the distal tip due to the overlap of bony structures and spinal hardware.  IMPRESSION: 1. Right sided ventriculoperitoneal shunt catheter is are again noted. The non fractured presumably working catheter within the right side of neck and chest is again identified. However, the tip of this catheter is in the projection of the lower thoracic spine and spinal hardware an I cannot confidently identify its location.   Electronically Signed   By: Kerby Moors M.D.   On: 11/06/2014 17:25   Dg Abd Acute W/chest  10/25/2014   CLINICAL DATA:  Ventriculoperitoneal shunt present. Headache for 1 week. Assessing ventriculoperitoneal shunt location  EXAM: ACUTE ABDOMEN SERIES (ABDOMEN 2 VIEW & CHEST 1 VIEW)  COMPARISON:  None.  FINDINGS: PA chest: Ventriculoperitoneal shunt is seen along the right hemithorax. There appears to be coiling of the shunt in the right neck region. Lungs are clear. Heart size is within normal limits. Pulmonary vascularity is normal. There is extensive postoperative change in the thoracic spine with extensive spinal dysraphism present.  Supine and left lateral decubitus abdomen: The shunt catheter tip is seen on the decubitus examination. Tip is just below the diaphragm in the midline of the right abdomen. The overall bowel gas pattern is unremarkable. No obstruction or free air. Postoperative change in the lumbar spine with spinal dysraphism noted.  IMPRESSION: Shunt catheter is at a level just below the diaphragm in the midline of the abdomen. It is not possible to ascertain that the catheter is in the peritoneum versus in the soft tissues overlying the intra-abdominal contents. Bowel gas pattern unremarkable.  Postoperative change in the spine region. Note that there is swelling of a portion of the shunt catheter in the neck region on the right. No lung edema or consolidation.   Electronically Signed   By: Lowella Grip III M.D.   On: 10/25/2014 21:11      Microbiology: No results found for this or any previous visit (from the past 240 hour(s)).   Labs: Results for orders placed or performed during the hospital encounter of 11/06/14 (from the past 48 hour(s))  CBC with Differential     Status: Abnormal   Collection Time: 11/06/14  3:30 PM  Result Value Ref Range   WBC 15.1 (H) 4.0 - 10.5 K/uL   RBC 4.48 4.22 - 5.81 MIL/uL   Hemoglobin 11.6 (L) 13.0 - 17.0 g/dL   HCT 36.2 (L) 39.0 - 52.0 %   MCV 80.8 78.0 - 100.0 fL   MCH 25.9 (L) 26.0 - 34.0 pg   MCHC 32.0 30.0 - 36.0  g/dL   RDW 13.9 11.5 - 15.5 %   Platelets 263 150 - 400 K/uL   Neutrophils Relative % 86 (H) 43 - 77 %   Neutro Abs 12.9 (H) 1.7 - 7.7 K/uL   Lymphocytes Relative 6 (L) 12 - 46 %   Lymphs Abs 1.0 0.7 - 4.0 K/uL   Monocytes Relative 8 3 - 12 %   Monocytes Absolute 1.2 (H) 0.1 - 1.0 K/uL   Eosinophils Relative 0 0 - 5 %   Eosinophils Absolute 0.0 0.0 - 0.7 K/uL   Basophils Relative 0 0 - 1 %   Basophils Absolute 0.0 0.0 - 0.1 K/uL  Basic metabolic panel     Status: Abnormal   Collection Time: 11/06/14  3:30 PM  Result Value Ref Range   Sodium 132 (L) 135 - 145 mmol/L   Potassium 4.3 3.5 - 5.1 mmol/L   Chloride 86 (L) 96 - 112 mmol/L   CO2 30 19 - 32 mmol/L   Glucose, Bld 114 (H) 70 - 99 mg/dL   BUN 32 (H) 6 - 23 mg/dL   Creatinine, Ser 1.03 0.50 - 1.35 mg/dL   Calcium 9.6 8.4 - 10.5 mg/dL   GFR calc non Af Amer >90 >90 mL/min   GFR calc Af Amer >90 >90 mL/min    Comment: (NOTE) The eGFR has been calculated using the CKD EPI equation. This calculation has not been validated in all clinical situations. eGFR's persistently <90 mL/min signify possible Chronic Kidney Disease.    Anion gap 16 (H) 5 - 15   Urinalysis, Routine w reflex microscopic     Status: Abnormal   Collection Time: 11/06/14  5:45 PM  Result Value Ref Range   Color, Urine YELLOW YELLOW   APPearance CLOUDY (A) CLEAR   Specific Gravity, Urine 1.020 1.005 - 1.030   pH 5.5 5.0 - 8.0   Glucose, UA NEGATIVE NEGATIVE mg/dL   Hgb urine dipstick TRACE (A) NEGATIVE   Bilirubin Urine NEGATIVE NEGATIVE   Ketones, ur NEGATIVE NEGATIVE mg/dL   Protein, ur 30 (A) NEGATIVE mg/dL   Urobilinogen, UA 0.2 0.0 - 1.0 mg/dL   Nitrite NEGATIVE NEGATIVE   Leukocytes, UA MODERATE (A) NEGATIVE  Urine microscopic-add on     Status: Abnormal   Collection Time: 11/06/14  5:45 PM  Result Value Ref Range   Squamous Epithelial / LPF RARE RARE   WBC, UA 21-50 <3 WBC/hpf   RBC / HPF 0-2 <3 RBC/hpf   Bacteria, UA FEW (A) RARE   Casts HYALINE CASTS (A) NEGATIVE   Urine-Other RARE YEAST   CBC     Status: Abnormal   Collection Time: 11/06/14  9:46 PM  Result Value Ref Range   WBC 10.5 4.0 - 10.5 K/uL   RBC 3.95 (L) 4.22 - 5.81 MIL/uL   Hemoglobin 10.3 (L) 13.0 - 17.0 g/dL   HCT 32.5 (L) 39.0 - 52.0 %   MCV 82.3 78.0 - 100.0 fL   MCH 26.1 26.0 - 34.0 pg   MCHC 31.7 30.0 - 36.0 g/dL   RDW 13.9 11.5 - 15.5 %   Platelets 275 150 - 400 K/uL  Creatinine, serum     Status: None   Collection Time: 11/06/14  9:46 PM  Result Value Ref Range   Creatinine, Ser 0.92 0.50 - 1.35 mg/dL   GFR calc non Af Amer >90 >90 mL/min   GFR calc Af Amer >90 >90 mL/min    Comment: (NOTE) The eGFR has been  calculated using the CKD EPI equation. This calculation has not been validated in all clinical situations. eGFR's persistently <90 mL/min signify possible Chronic Kidney Disease.   TSH     Status: None   Collection Time: 11/06/14  9:46 PM  Result Value Ref Range   TSH 1.584 0.350 - 4.500 uIU/mL  Comprehensive metabolic panel     Status: Abnormal   Collection Time: 11/07/14  5:40 AM  Result Value Ref Range   Sodium 132 (L) 135 - 145 mmol/L   Potassium 3.5  3.5 - 5.1 mmol/L    Comment: DELTA CHECK NOTED   Chloride 91 (L) 96 - 112 mmol/L   CO2 30 19 - 32 mmol/L   Glucose, Bld 92 70 - 99 mg/dL   BUN 19 6 - 23 mg/dL    Comment: DELTA CHECK NOTED   Creatinine, Ser 0.74 0.50 - 1.35 mg/dL   Calcium 8.8 8.4 - 10.5 mg/dL   Total Protein 6.8 6.0 - 8.3 g/dL   Albumin 3.2 (L) 3.5 - 5.2 g/dL   AST 64 (H) 0 - 37 U/L   ALT 90 (H) 0 - 53 U/L   Alkaline Phosphatase 121 (H) 39 - 117 U/L   Total Bilirubin 0.4 0.3 - 1.2 mg/dL   GFR calc non Af Amer >90 >90 mL/min   GFR calc Af Amer >90 >90 mL/min    Comment: (NOTE) The eGFR has been calculated using the CKD EPI equation. This calculation has not been validated in all clinical situations. eGFR's persistently <90 mL/min signify possible Chronic Kidney Disease.    Anion gap 11 5 - 15  CBC     Status: Abnormal   Collection Time: 11/07/14  5:40 AM  Result Value Ref Range   WBC 9.2 4.0 - 10.5 K/uL   RBC 3.79 (L) 4.22 - 5.81 MIL/uL   Hemoglobin 9.6 (L) 13.0 - 17.0 g/dL   HCT 30.6 (L) 39.0 - 52.0 %   MCV 80.7 78.0 - 100.0 fL   MCH 25.3 (L) 26.0 - 34.0 pg   MCHC 31.4 30.0 - 36.0 g/dL   RDW 13.9 11.5 - 15.5 %   Platelets 313 150 - 400 K/uL     HPI : 34 year old male with a very complex past history who is admitted 2/13-2/18 with UTI and SIRS, found to have a urinary tract infection and discharged home on Keflex, presented back to the ED on 2/25 with a headache. He has a history of Chiari II malformation and spina bifida and underwent ventriculoperitoneal shunting. He was admitted in October and they spoke with the neurosurgeon on call here who suggested not externalizing the shunt for a cholecystectomy. The patient then had a bowel leak requiring transfer to [facility] where he had multiple surgeries. His ventriculoperitoneal shunt was externalized and then created a [facility] sh[facility] by Dr Tivis Ringer. He has developed headaches and poor appetite and was found to have a recurrent UTI and is being treated with  Rocephin. His urinary tract infection has recurred after an extended course of Keflex outpatient. Urine culture from 2/13 showed Escherichia coli sensitive to Rocephin, ciprofloxacin, nitrofurantoin.  Patient has had 2 CT scans within the last month. CT scan on 2/15 was read out as acute hydrocephalus with mild transependymal absorption and neurosurgical evaluation was requested. A shunt series was also performed. Dr. Ronnald Ramp from neurosurgery was consulted who did not recommend any further intervention. According to him the shunt was working, and benefit off tapping the shunt would outweigh the  risk.  Patient got readmitted on 2/25 with worsening headache I discussed the findings of the patient's CT scan with Dr. Donald Pore, neurosurgery He recommended that the patient be transferred to [facility] for possible shunt replacement. He does not think that it would be in the patient's best interest to wait, until his urinary tract infection had cleared up.  Patient accepted to [facility] by Dr. Horald Chestnut M.D., neurosurgery, and will be transferred today  Discharge Exam:  Blood pressure 113/64, pulse 89, temperature 98.2 F (36.8 C), temperature source Oral, resp. rate 18, height _0  (1.575 m), weight 72.3 kg (159 lb 6.3 oz), SpO2 97 %.  Neck: Normal range of motion. Neck supple.  Cardiovascular: Normal rate, regular rhythm and normal heart sounds.  Pulmonary/Chest: Effort normal and breath sounds normal. No respiratory distress.  Abdominal: He exhibits no distension. There is no tenderness. There is no rebound and no guarding.  Musculoskeletal: Normal range of motion.  Neurological: He is alert.        Discharge Instructions    Diet - low sodium heart healthy    Complete by:  As directed      Diet - low sodium heart healthy    Complete by:  As directed      Increase activity slowly    Complete by:  As directed      Increase activity slowly    Complete by:  As directed             Follow-up Information    Follow up with Elyn Peers, MD. Schedule an appointment as soon as possible for a visit in 1 week.   Specialty:  Family Medicine   Contact information:   [address] [place] [address] [phone]       Signed: Reyne Dumas 11/07/2014, 1:29 PM

## 2014-11-07 NOTE — Progress Notes (Signed)
UR Completed.  [phone]  

## 2014-11-07 NOTE — Progress Notes (Signed)
Pt prepared for d/c to [facility]. IV left in place. Skin intact except as most recently charted. Vitals are stable. Report called to [facility] at receiving facility. Pt to be transported by ambulance service ([facility]).  Jillyn Ledger, MBA, BS, RN

## 2014-11-08 LAB — URINE CULTURE
COLONY COUNT: NO GROWTH
CULTURE: NO GROWTH

## 2014-11-08 LAB — HEMOGLOBIN A1C
HEMOGLOBIN A1C: 5.6 % (ref 4.8–5.6)
Mean Plasma Glucose: 114 mg/dL

## 2014-11-24 ENCOUNTER — Encounter: Payer: Medicare Other | Attending: Family Medicine | Admitting: Skilled Nursing Facility1

## 2014-11-24 ENCOUNTER — Encounter: Payer: Self-pay | Admitting: Skilled Nursing Facility1

## 2014-11-24 VITALS — Ht 62.0 in | Wt 166.0 lb

## 2014-11-24 DIAGNOSIS — E663 Overweight: Secondary | ICD-10-CM | POA: Diagnosis present

## 2014-11-24 DIAGNOSIS — Z713 Dietary counseling and surveillance: Secondary | ICD-10-CM | POA: Diagnosis not present

## 2014-11-24 DIAGNOSIS — Z6830 Body mass index (BMI) 30.0-30.9, adult: Secondary | ICD-10-CM | POA: Diagnosis not present

## 2014-11-24 NOTE — Patient Instructions (Signed)
-  Try to make at least one meal every day for yourself -Try to cook with olive oil or canola oil rather than butter -Steam your vegetables instead of boiling them -Try to pick out a new vegetable every week -Try to eat 3 meals a day and 2-3 snacks every day -Try a spinach and orange juice smoothie for some more iron -Aim to do some arm chair exercises every day -Try to be outside for about thirty minutes -Ensure website has great smoothie recipes -Look for the USP label for your supplements

## 2014-11-24 NOTE — Progress Notes (Signed)
  Medical Nutrition Therapy:  Appt start time: 0800 end time:  0900.   Assessment:  Primary concerns today: Referred for overweight. Patient states physicians states he is fat. States he sleeps well. Patient Likes to watch TV all day. Physicain states he cannot have: collards, corn, sweet peas, skin off the hot dogs/sausage, carrots need to smashed, seeds/nuts. Patient states his Mom and sister prepares his meals. Patient states he Drinks 64 ounces of water three times a day. Patient states he Hated the nursing home in 2014 and greatly fears having to go back. Patients mother states When he drinks soda she has to burp the ileostomy bag. Mobility: good range if motion for upper body. Feel: tired since he got out of the hospital. Patients mother states they will not have the ileostomy bag removed because she would have to conduct a lot more care for her son including frequent diaper changes. Patient was recently (3 weeks ago) in the hospital for sepssi with acute renal failure and had to have his shunt corrected at [facility].  Patient was polite but did not wish to be more independent nor to try new foods and add more vegetables to his diet. Patient likes his mother/sister/nurse taking care of him and does not want to do more for himself. Patients A1C is 5.6% Patients mother states she has a strict budget.  Preferred Learning Style:   No preference indicated   Learning Readiness:  Not ready  MEDICATIONS: See List   DIETARY INTAKE:  Usual eating pattern includes 3 meals and 2-3 snacks per day.  Everyday foods include none stated.  Avoided foods include insoluble fibers.    24-hr recall:  B ( AM): grits, kilbasa sausage Snk ( AM): chips L ( PM): BBQ sandwhich Snk ( PM): chips D ( PM): half grilled chicken with mustard on bun with baked beans Snk ( PM): chips Beverages: 64 ounces of water with sugar free hawain punch  *Eats outside of the home once a month  Usual physical activity:  ADL's  Estimated energy needs: 1600 calories  180 g carbohydrates 120 g protein 44 g fat  Progress Towards Goal(s):  In progress.   Nutritional Diagnosis:  Superior-3.3 Overweight/obesity As related to wheel chair bound and excess intake of high fat foods.  As evidenced by patient statement and BMI 30.    Intervention:  Nutrition counseling for overweight. Dietitian educated patient on a high protein diet, the importance of being physically active, the importance of eating proper portions/vegetables and being more independent.  Goals:  -Try to make at least one meal every day for yourself -Try to cook with olive oil or canola oil rather than butter -Steam your vegetables instead of boiling them -Try to pick out a new vegetable every week -Try to eat 3 meals a day and 2-3 snacks every day -Try a spinach and orange juice smoothie for some more iron -Aim to do some arm chair exercises every day -Try to be outside for about thirty minutes -Ensure website has great smoothie recipes -Look for the USP label for your supplements Teaching Method Utilized:  Visual Auditory  Handouts given during visit include:  Arm chair workouts  15 gram carbohydrate snack  Ileostomy diet  Barriers to learning/adherence to lifestyle change: lack of wanting to be more independent.  Demonstrated degree of understanding via:  Teach Back   Monitoring/Evaluation:  Dietary intake, exercise, and body weight prn.

## 2014-12-21 ENCOUNTER — Other Ambulatory Visit: Payer: Self-pay | Admitting: Internal Medicine

## 2015-02-13 DIAGNOSIS — N319 Neuromuscular dysfunction of bladder, unspecified: Secondary | ICD-10-CM | POA: Diagnosis not present

## 2015-02-13 DIAGNOSIS — N3020 Other chronic cystitis without hematuria: Secondary | ICD-10-CM | POA: Diagnosis not present

## 2015-02-19 DIAGNOSIS — E46 Unspecified protein-calorie malnutrition: Secondary | ICD-10-CM | POA: Diagnosis not present

## 2015-02-19 DIAGNOSIS — L89312 Pressure ulcer of right buttock, stage 2: Secondary | ICD-10-CM | POA: Diagnosis not present

## 2015-02-19 DIAGNOSIS — Z9181 History of falling: Secondary | ICD-10-CM | POA: Diagnosis not present

## 2015-02-19 DIAGNOSIS — D649 Anemia, unspecified: Secondary | ICD-10-CM | POA: Diagnosis not present

## 2015-02-19 DIAGNOSIS — G8220 Paraplegia, unspecified: Secondary | ICD-10-CM | POA: Diagnosis not present

## 2015-02-19 DIAGNOSIS — I10 Essential (primary) hypertension: Secondary | ICD-10-CM | POA: Diagnosis not present

## 2015-02-19 DIAGNOSIS — Q059 Spina bifida, unspecified: Secondary | ICD-10-CM | POA: Diagnosis not present

## 2015-02-19 DIAGNOSIS — L89322 Pressure ulcer of left buttock, stage 2: Secondary | ICD-10-CM | POA: Diagnosis not present

## 2015-02-23 DIAGNOSIS — Z932 Ileostomy status: Secondary | ICD-10-CM | POA: Diagnosis not present

## 2015-02-25 DIAGNOSIS — L89312 Pressure ulcer of right buttock, stage 2: Secondary | ICD-10-CM | POA: Diagnosis not present

## 2015-02-25 DIAGNOSIS — Z9181 History of falling: Secondary | ICD-10-CM | POA: Diagnosis not present

## 2015-02-25 DIAGNOSIS — Q059 Spina bifida, unspecified: Secondary | ICD-10-CM | POA: Diagnosis not present

## 2015-02-25 DIAGNOSIS — D649 Anemia, unspecified: Secondary | ICD-10-CM | POA: Diagnosis not present

## 2015-02-25 DIAGNOSIS — E46 Unspecified protein-calorie malnutrition: Secondary | ICD-10-CM | POA: Diagnosis not present

## 2015-02-25 DIAGNOSIS — G8220 Paraplegia, unspecified: Secondary | ICD-10-CM | POA: Diagnosis not present

## 2015-02-25 DIAGNOSIS — I10 Essential (primary) hypertension: Secondary | ICD-10-CM | POA: Diagnosis not present

## 2015-02-25 DIAGNOSIS — L89322 Pressure ulcer of left buttock, stage 2: Secondary | ICD-10-CM | POA: Diagnosis not present

## 2015-02-26 DIAGNOSIS — K5732 Diverticulitis of large intestine without perforation or abscess without bleeding: Secondary | ICD-10-CM | POA: Diagnosis not present

## 2015-02-26 DIAGNOSIS — Z932 Ileostomy status: Secondary | ICD-10-CM | POA: Diagnosis not present

## 2015-03-05 DIAGNOSIS — Z932 Ileostomy status: Secondary | ICD-10-CM | POA: Diagnosis not present

## 2015-03-10 DIAGNOSIS — I10 Essential (primary) hypertension: Secondary | ICD-10-CM | POA: Diagnosis not present

## 2015-03-10 DIAGNOSIS — Z9181 History of falling: Secondary | ICD-10-CM | POA: Diagnosis not present

## 2015-03-10 DIAGNOSIS — D649 Anemia, unspecified: Secondary | ICD-10-CM | POA: Diagnosis not present

## 2015-03-10 DIAGNOSIS — Q059 Spina bifida, unspecified: Secondary | ICD-10-CM | POA: Diagnosis not present

## 2015-03-10 DIAGNOSIS — E46 Unspecified protein-calorie malnutrition: Secondary | ICD-10-CM | POA: Diagnosis not present

## 2015-03-10 DIAGNOSIS — L89322 Pressure ulcer of left buttock, stage 2: Secondary | ICD-10-CM | POA: Diagnosis not present

## 2015-03-10 DIAGNOSIS — L89312 Pressure ulcer of right buttock, stage 2: Secondary | ICD-10-CM | POA: Diagnosis not present

## 2015-03-10 DIAGNOSIS — G8220 Paraplegia, unspecified: Secondary | ICD-10-CM | POA: Diagnosis not present

## 2015-03-17 DIAGNOSIS — G919 Hydrocephalus, unspecified: Secondary | ICD-10-CM | POA: Diagnosis not present

## 2015-03-17 DIAGNOSIS — I10 Essential (primary) hypertension: Secondary | ICD-10-CM | POA: Diagnosis not present

## 2015-03-17 DIAGNOSIS — E782 Mixed hyperlipidemia: Secondary | ICD-10-CM | POA: Diagnosis not present

## 2015-03-19 DIAGNOSIS — G8220 Paraplegia, unspecified: Secondary | ICD-10-CM | POA: Diagnosis not present

## 2015-03-19 DIAGNOSIS — L89312 Pressure ulcer of right buttock, stage 2: Secondary | ICD-10-CM | POA: Diagnosis not present

## 2015-03-19 DIAGNOSIS — E46 Unspecified protein-calorie malnutrition: Secondary | ICD-10-CM | POA: Diagnosis not present

## 2015-03-19 DIAGNOSIS — L89322 Pressure ulcer of left buttock, stage 2: Secondary | ICD-10-CM | POA: Diagnosis not present

## 2015-03-19 DIAGNOSIS — Q059 Spina bifida, unspecified: Secondary | ICD-10-CM | POA: Diagnosis not present

## 2015-03-19 DIAGNOSIS — D649 Anemia, unspecified: Secondary | ICD-10-CM | POA: Diagnosis not present

## 2015-03-19 DIAGNOSIS — I10 Essential (primary) hypertension: Secondary | ICD-10-CM | POA: Diagnosis not present

## 2015-03-19 DIAGNOSIS — Z9181 History of falling: Secondary | ICD-10-CM | POA: Diagnosis not present

## 2015-03-26 DIAGNOSIS — I10 Essential (primary) hypertension: Secondary | ICD-10-CM | POA: Diagnosis not present

## 2015-04-02 DIAGNOSIS — Q059 Spina bifida, unspecified: Secondary | ICD-10-CM | POA: Diagnosis not present

## 2015-04-02 DIAGNOSIS — L89312 Pressure ulcer of right buttock, stage 2: Secondary | ICD-10-CM | POA: Diagnosis not present

## 2015-04-02 DIAGNOSIS — L89322 Pressure ulcer of left buttock, stage 2: Secondary | ICD-10-CM | POA: Diagnosis not present

## 2015-04-02 DIAGNOSIS — E46 Unspecified protein-calorie malnutrition: Secondary | ICD-10-CM | POA: Diagnosis not present

## 2015-04-02 DIAGNOSIS — G8220 Paraplegia, unspecified: Secondary | ICD-10-CM | POA: Diagnosis not present

## 2015-04-02 DIAGNOSIS — I10 Essential (primary) hypertension: Secondary | ICD-10-CM | POA: Diagnosis not present

## 2015-04-02 DIAGNOSIS — Z9181 History of falling: Secondary | ICD-10-CM | POA: Diagnosis not present

## 2015-04-02 DIAGNOSIS — D649 Anemia, unspecified: Secondary | ICD-10-CM | POA: Diagnosis not present

## 2015-04-16 DIAGNOSIS — D649 Anemia, unspecified: Secondary | ICD-10-CM | POA: Diagnosis not present

## 2015-04-16 DIAGNOSIS — E46 Unspecified protein-calorie malnutrition: Secondary | ICD-10-CM | POA: Diagnosis not present

## 2015-04-16 DIAGNOSIS — Z9181 History of falling: Secondary | ICD-10-CM | POA: Diagnosis not present

## 2015-04-16 DIAGNOSIS — Q059 Spina bifida, unspecified: Secondary | ICD-10-CM | POA: Diagnosis not present

## 2015-04-16 DIAGNOSIS — L89312 Pressure ulcer of right buttock, stage 2: Secondary | ICD-10-CM | POA: Diagnosis not present

## 2015-04-16 DIAGNOSIS — G8220 Paraplegia, unspecified: Secondary | ICD-10-CM | POA: Diagnosis not present

## 2015-04-16 DIAGNOSIS — L89322 Pressure ulcer of left buttock, stage 2: Secondary | ICD-10-CM | POA: Diagnosis not present

## 2015-04-16 DIAGNOSIS — I10 Essential (primary) hypertension: Secondary | ICD-10-CM | POA: Diagnosis not present

## 2015-04-24 DIAGNOSIS — E46 Unspecified protein-calorie malnutrition: Secondary | ICD-10-CM | POA: Diagnosis not present

## 2015-04-24 DIAGNOSIS — L89312 Pressure ulcer of right buttock, stage 2: Secondary | ICD-10-CM | POA: Diagnosis not present

## 2015-04-24 DIAGNOSIS — I10 Essential (primary) hypertension: Secondary | ICD-10-CM | POA: Diagnosis not present

## 2015-04-24 DIAGNOSIS — L89322 Pressure ulcer of left buttock, stage 2: Secondary | ICD-10-CM | POA: Diagnosis not present

## 2015-04-24 DIAGNOSIS — D649 Anemia, unspecified: Secondary | ICD-10-CM | POA: Diagnosis not present

## 2015-04-24 DIAGNOSIS — Z9181 History of falling: Secondary | ICD-10-CM | POA: Diagnosis not present

## 2015-04-24 DIAGNOSIS — G8220 Paraplegia, unspecified: Secondary | ICD-10-CM | POA: Diagnosis not present

## 2015-04-24 DIAGNOSIS — Q059 Spina bifida, unspecified: Secondary | ICD-10-CM | POA: Diagnosis not present

## 2015-04-30 DIAGNOSIS — G8220 Paraplegia, unspecified: Secondary | ICD-10-CM | POA: Diagnosis not present

## 2015-04-30 DIAGNOSIS — E46 Unspecified protein-calorie malnutrition: Secondary | ICD-10-CM | POA: Diagnosis not present

## 2015-04-30 DIAGNOSIS — D649 Anemia, unspecified: Secondary | ICD-10-CM | POA: Diagnosis not present

## 2015-04-30 DIAGNOSIS — L89322 Pressure ulcer of left buttock, stage 2: Secondary | ICD-10-CM | POA: Diagnosis not present

## 2015-04-30 DIAGNOSIS — Z9181 History of falling: Secondary | ICD-10-CM | POA: Diagnosis not present

## 2015-04-30 DIAGNOSIS — I10 Essential (primary) hypertension: Secondary | ICD-10-CM | POA: Diagnosis not present

## 2015-04-30 DIAGNOSIS — L89312 Pressure ulcer of right buttock, stage 2: Secondary | ICD-10-CM | POA: Diagnosis not present

## 2015-04-30 DIAGNOSIS — Q059 Spina bifida, unspecified: Secondary | ICD-10-CM | POA: Diagnosis not present

## 2015-05-08 DIAGNOSIS — Q059 Spina bifida, unspecified: Secondary | ICD-10-CM | POA: Diagnosis not present

## 2015-05-08 DIAGNOSIS — Z9181 History of falling: Secondary | ICD-10-CM | POA: Diagnosis not present

## 2015-05-08 DIAGNOSIS — L89312 Pressure ulcer of right buttock, stage 2: Secondary | ICD-10-CM | POA: Diagnosis not present

## 2015-05-08 DIAGNOSIS — I10 Essential (primary) hypertension: Secondary | ICD-10-CM | POA: Diagnosis not present

## 2015-05-08 DIAGNOSIS — G8220 Paraplegia, unspecified: Secondary | ICD-10-CM | POA: Diagnosis not present

## 2015-05-08 DIAGNOSIS — D649 Anemia, unspecified: Secondary | ICD-10-CM | POA: Diagnosis not present

## 2015-05-08 DIAGNOSIS — L89322 Pressure ulcer of left buttock, stage 2: Secondary | ICD-10-CM | POA: Diagnosis not present

## 2015-05-08 DIAGNOSIS — E46 Unspecified protein-calorie malnutrition: Secondary | ICD-10-CM | POA: Diagnosis not present

## 2015-05-11 DIAGNOSIS — K5732 Diverticulitis of large intestine without perforation or abscess without bleeding: Secondary | ICD-10-CM | POA: Diagnosis not present

## 2015-05-11 DIAGNOSIS — Z932 Ileostomy status: Secondary | ICD-10-CM | POA: Diagnosis not present

## 2015-05-12 ENCOUNTER — Encounter (HOSPITAL_BASED_OUTPATIENT_CLINIC_OR_DEPARTMENT_OTHER): Payer: Medicare Other | Attending: General Surgery

## 2015-05-12 DIAGNOSIS — Q0701 Arnold-Chiari syndrome with spina bifida: Secondary | ICD-10-CM | POA: Insufficient documentation

## 2015-05-12 DIAGNOSIS — I10 Essential (primary) hypertension: Secondary | ICD-10-CM | POA: Insufficient documentation

## 2015-05-12 DIAGNOSIS — L89212 Pressure ulcer of right hip, stage 2: Secondary | ICD-10-CM | POA: Insufficient documentation

## 2015-05-12 DIAGNOSIS — G8220 Paraplegia, unspecified: Secondary | ICD-10-CM | POA: Insufficient documentation

## 2015-05-14 DIAGNOSIS — Q059 Spina bifida, unspecified: Secondary | ICD-10-CM | POA: Diagnosis not present

## 2015-05-14 DIAGNOSIS — I10 Essential (primary) hypertension: Secondary | ICD-10-CM | POA: Diagnosis not present

## 2015-05-14 DIAGNOSIS — G8220 Paraplegia, unspecified: Secondary | ICD-10-CM | POA: Diagnosis not present

## 2015-05-14 DIAGNOSIS — D649 Anemia, unspecified: Secondary | ICD-10-CM | POA: Diagnosis not present

## 2015-05-14 DIAGNOSIS — Z9181 History of falling: Secondary | ICD-10-CM | POA: Diagnosis not present

## 2015-05-14 DIAGNOSIS — L89312 Pressure ulcer of right buttock, stage 2: Secondary | ICD-10-CM | POA: Diagnosis not present

## 2015-05-14 DIAGNOSIS — L89322 Pressure ulcer of left buttock, stage 2: Secondary | ICD-10-CM | POA: Diagnosis not present

## 2015-05-14 DIAGNOSIS — E46 Unspecified protein-calorie malnutrition: Secondary | ICD-10-CM | POA: Diagnosis not present

## 2015-05-19 ENCOUNTER — Encounter (HOSPITAL_BASED_OUTPATIENT_CLINIC_OR_DEPARTMENT_OTHER): Payer: Medicare Other | Attending: General Surgery

## 2015-05-19 DIAGNOSIS — Q0701 Arnold-Chiari syndrome with spina bifida: Secondary | ICD-10-CM | POA: Diagnosis not present

## 2015-05-19 DIAGNOSIS — G8220 Paraplegia, unspecified: Secondary | ICD-10-CM | POA: Insufficient documentation

## 2015-05-19 DIAGNOSIS — L89212 Pressure ulcer of right hip, stage 2: Secondary | ICD-10-CM | POA: Insufficient documentation

## 2015-05-19 DIAGNOSIS — I10 Essential (primary) hypertension: Secondary | ICD-10-CM | POA: Diagnosis not present

## 2015-05-21 DIAGNOSIS — I10 Essential (primary) hypertension: Secondary | ICD-10-CM | POA: Diagnosis not present

## 2015-05-21 DIAGNOSIS — L89322 Pressure ulcer of left buttock, stage 2: Secondary | ICD-10-CM | POA: Diagnosis not present

## 2015-05-21 DIAGNOSIS — E46 Unspecified protein-calorie malnutrition: Secondary | ICD-10-CM | POA: Diagnosis not present

## 2015-05-21 DIAGNOSIS — Q059 Spina bifida, unspecified: Secondary | ICD-10-CM | POA: Diagnosis not present

## 2015-05-21 DIAGNOSIS — D649 Anemia, unspecified: Secondary | ICD-10-CM | POA: Diagnosis not present

## 2015-05-21 DIAGNOSIS — L89312 Pressure ulcer of right buttock, stage 2: Secondary | ICD-10-CM | POA: Diagnosis not present

## 2015-05-21 DIAGNOSIS — G8220 Paraplegia, unspecified: Secondary | ICD-10-CM | POA: Diagnosis not present

## 2015-05-21 DIAGNOSIS — Z9181 History of falling: Secondary | ICD-10-CM | POA: Diagnosis not present

## 2015-05-26 DIAGNOSIS — G8220 Paraplegia, unspecified: Secondary | ICD-10-CM | POA: Diagnosis not present

## 2015-05-26 DIAGNOSIS — I10 Essential (primary) hypertension: Secondary | ICD-10-CM | POA: Diagnosis not present

## 2015-05-26 DIAGNOSIS — L89212 Pressure ulcer of right hip, stage 2: Secondary | ICD-10-CM | POA: Diagnosis not present

## 2015-05-26 DIAGNOSIS — Q0701 Arnold-Chiari syndrome with spina bifida: Secondary | ICD-10-CM | POA: Diagnosis not present

## 2015-05-28 DIAGNOSIS — I10 Essential (primary) hypertension: Secondary | ICD-10-CM | POA: Diagnosis not present

## 2015-05-28 DIAGNOSIS — L89312 Pressure ulcer of right buttock, stage 2: Secondary | ICD-10-CM | POA: Diagnosis not present

## 2015-05-28 DIAGNOSIS — Z4800 Encounter for change or removal of nonsurgical wound dressing: Secondary | ICD-10-CM | POA: Diagnosis not present

## 2015-05-28 DIAGNOSIS — L89222 Pressure ulcer of left hip, stage 2: Secondary | ICD-10-CM | POA: Diagnosis not present

## 2015-05-28 DIAGNOSIS — G8220 Paraplegia, unspecified: Secondary | ICD-10-CM | POA: Diagnosis not present

## 2015-05-28 DIAGNOSIS — Q059 Spina bifida, unspecified: Secondary | ICD-10-CM | POA: Diagnosis not present

## 2015-06-03 DIAGNOSIS — L89313 Pressure ulcer of right buttock, stage 3: Secondary | ICD-10-CM | POA: Diagnosis not present

## 2015-06-03 DIAGNOSIS — G8220 Paraplegia, unspecified: Secondary | ICD-10-CM | POA: Diagnosis not present

## 2015-06-03 DIAGNOSIS — Q0701 Arnold-Chiari syndrome with spina bifida: Secondary | ICD-10-CM | POA: Diagnosis not present

## 2015-06-04 DIAGNOSIS — L89312 Pressure ulcer of right buttock, stage 2: Secondary | ICD-10-CM | POA: Diagnosis not present

## 2015-06-04 DIAGNOSIS — Q059 Spina bifida, unspecified: Secondary | ICD-10-CM | POA: Diagnosis not present

## 2015-06-04 DIAGNOSIS — I10 Essential (primary) hypertension: Secondary | ICD-10-CM | POA: Diagnosis not present

## 2015-06-04 DIAGNOSIS — L89222 Pressure ulcer of left hip, stage 2: Secondary | ICD-10-CM | POA: Diagnosis not present

## 2015-06-04 DIAGNOSIS — Z4800 Encounter for change or removal of nonsurgical wound dressing: Secondary | ICD-10-CM | POA: Diagnosis not present

## 2015-06-04 DIAGNOSIS — G8220 Paraplegia, unspecified: Secondary | ICD-10-CM | POA: Diagnosis not present

## 2015-06-08 DIAGNOSIS — Z932 Ileostomy status: Secondary | ICD-10-CM | POA: Diagnosis not present

## 2015-06-08 DIAGNOSIS — K5732 Diverticulitis of large intestine without perforation or abscess without bleeding: Secondary | ICD-10-CM | POA: Diagnosis not present

## 2015-06-09 DIAGNOSIS — I10 Essential (primary) hypertension: Secondary | ICD-10-CM | POA: Diagnosis not present

## 2015-06-09 DIAGNOSIS — L89212 Pressure ulcer of right hip, stage 2: Secondary | ICD-10-CM | POA: Diagnosis not present

## 2015-06-09 DIAGNOSIS — G8220 Paraplegia, unspecified: Secondary | ICD-10-CM | POA: Diagnosis not present

## 2015-06-09 DIAGNOSIS — Q0701 Arnold-Chiari syndrome with spina bifida: Secondary | ICD-10-CM | POA: Diagnosis not present

## 2015-06-11 DIAGNOSIS — Q059 Spina bifida, unspecified: Secondary | ICD-10-CM | POA: Diagnosis not present

## 2015-06-11 DIAGNOSIS — Z4800 Encounter for change or removal of nonsurgical wound dressing: Secondary | ICD-10-CM | POA: Diagnosis not present

## 2015-06-11 DIAGNOSIS — L89312 Pressure ulcer of right buttock, stage 2: Secondary | ICD-10-CM | POA: Diagnosis not present

## 2015-06-11 DIAGNOSIS — L89222 Pressure ulcer of left hip, stage 2: Secondary | ICD-10-CM | POA: Diagnosis not present

## 2015-06-11 DIAGNOSIS — G8220 Paraplegia, unspecified: Secondary | ICD-10-CM | POA: Diagnosis not present

## 2015-06-11 DIAGNOSIS — I10 Essential (primary) hypertension: Secondary | ICD-10-CM | POA: Diagnosis not present

## 2015-06-12 DIAGNOSIS — Z982 Presence of cerebrospinal fluid drainage device: Secondary | ICD-10-CM | POA: Diagnosis not present

## 2015-06-12 DIAGNOSIS — G8220 Paraplegia, unspecified: Secondary | ICD-10-CM | POA: Diagnosis not present

## 2015-06-12 DIAGNOSIS — Q051 Thoracic spina bifida with hydrocephalus: Secondary | ICD-10-CM | POA: Diagnosis not present

## 2015-06-16 DIAGNOSIS — I10 Essential (primary) hypertension: Secondary | ICD-10-CM | POA: Diagnosis not present

## 2015-06-16 DIAGNOSIS — E876 Hypokalemia: Secondary | ICD-10-CM | POA: Diagnosis not present

## 2015-06-16 DIAGNOSIS — Z23 Encounter for immunization: Secondary | ICD-10-CM | POA: Diagnosis not present

## 2015-06-16 DIAGNOSIS — Q054 Unspecified spina bifida with hydrocephalus: Secondary | ICD-10-CM | POA: Diagnosis not present

## 2015-06-16 DIAGNOSIS — F70 Mild intellectual disabilities: Secondary | ICD-10-CM | POA: Diagnosis not present

## 2015-06-18 DIAGNOSIS — I10 Essential (primary) hypertension: Secondary | ICD-10-CM | POA: Diagnosis not present

## 2015-06-18 DIAGNOSIS — Q059 Spina bifida, unspecified: Secondary | ICD-10-CM | POA: Diagnosis not present

## 2015-06-18 DIAGNOSIS — G8220 Paraplegia, unspecified: Secondary | ICD-10-CM | POA: Diagnosis not present

## 2015-06-18 DIAGNOSIS — L89312 Pressure ulcer of right buttock, stage 2: Secondary | ICD-10-CM | POA: Diagnosis not present

## 2015-06-18 DIAGNOSIS — L89222 Pressure ulcer of left hip, stage 2: Secondary | ICD-10-CM | POA: Diagnosis not present

## 2015-06-18 DIAGNOSIS — Z4800 Encounter for change or removal of nonsurgical wound dressing: Secondary | ICD-10-CM | POA: Diagnosis not present

## 2015-06-23 ENCOUNTER — Encounter (HOSPITAL_BASED_OUTPATIENT_CLINIC_OR_DEPARTMENT_OTHER): Payer: Medicare Other | Attending: General Surgery

## 2015-06-23 DIAGNOSIS — L89313 Pressure ulcer of right buttock, stage 3: Secondary | ICD-10-CM | POA: Insufficient documentation

## 2015-06-23 DIAGNOSIS — Q059 Spina bifida, unspecified: Secondary | ICD-10-CM | POA: Insufficient documentation

## 2015-06-23 DIAGNOSIS — G8220 Paraplegia, unspecified: Secondary | ICD-10-CM | POA: Diagnosis not present

## 2015-06-25 DIAGNOSIS — L89312 Pressure ulcer of right buttock, stage 2: Secondary | ICD-10-CM | POA: Diagnosis not present

## 2015-06-25 DIAGNOSIS — I10 Essential (primary) hypertension: Secondary | ICD-10-CM | POA: Diagnosis not present

## 2015-06-25 DIAGNOSIS — Q059 Spina bifida, unspecified: Secondary | ICD-10-CM | POA: Diagnosis not present

## 2015-06-25 DIAGNOSIS — Z4800 Encounter for change or removal of nonsurgical wound dressing: Secondary | ICD-10-CM | POA: Diagnosis not present

## 2015-06-25 DIAGNOSIS — G8220 Paraplegia, unspecified: Secondary | ICD-10-CM | POA: Diagnosis not present

## 2015-06-25 DIAGNOSIS — L89222 Pressure ulcer of left hip, stage 2: Secondary | ICD-10-CM | POA: Diagnosis not present

## 2015-07-02 DIAGNOSIS — Z4800 Encounter for change or removal of nonsurgical wound dressing: Secondary | ICD-10-CM | POA: Diagnosis not present

## 2015-07-02 DIAGNOSIS — G8220 Paraplegia, unspecified: Secondary | ICD-10-CM | POA: Diagnosis not present

## 2015-07-02 DIAGNOSIS — I10 Essential (primary) hypertension: Secondary | ICD-10-CM | POA: Diagnosis not present

## 2015-07-02 DIAGNOSIS — L89222 Pressure ulcer of left hip, stage 2: Secondary | ICD-10-CM | POA: Diagnosis not present

## 2015-07-02 DIAGNOSIS — Q059 Spina bifida, unspecified: Secondary | ICD-10-CM | POA: Diagnosis not present

## 2015-07-02 DIAGNOSIS — L89312 Pressure ulcer of right buttock, stage 2: Secondary | ICD-10-CM | POA: Diagnosis not present

## 2015-07-03 DIAGNOSIS — G8220 Paraplegia, unspecified: Secondary | ICD-10-CM | POA: Diagnosis not present

## 2015-07-03 DIAGNOSIS — Q0701 Arnold-Chiari syndrome with spina bifida: Secondary | ICD-10-CM | POA: Diagnosis not present

## 2015-07-03 DIAGNOSIS — L89313 Pressure ulcer of right buttock, stage 3: Secondary | ICD-10-CM | POA: Diagnosis not present

## 2015-07-07 ENCOUNTER — Encounter (HOSPITAL_COMMUNITY): Payer: Self-pay | Admitting: Emergency Medicine

## 2015-07-07 ENCOUNTER — Emergency Department (HOSPITAL_COMMUNITY): Payer: Medicare Other

## 2015-07-07 ENCOUNTER — Emergency Department (HOSPITAL_COMMUNITY)
Admission: EM | Admit: 2015-07-07 | Discharge: 2015-07-07 | Payer: Medicare Other | Attending: Emergency Medicine | Admitting: Emergency Medicine

## 2015-07-07 DIAGNOSIS — Y831 Surgical operation with implant of artificial internal device as the cause of abnormal reaction of the patient, or of later complication, without mention of misadventure at the time of the procedure: Secondary | ICD-10-CM | POA: Insufficient documentation

## 2015-07-07 DIAGNOSIS — T8509XA Other mechanical complication of ventricular intracranial (communicating) shunt, initial encounter: Secondary | ICD-10-CM | POA: Insufficient documentation

## 2015-07-07 DIAGNOSIS — R258 Other abnormal involuntary movements: Secondary | ICD-10-CM | POA: Diagnosis not present

## 2015-07-07 DIAGNOSIS — T8502XA Displacement of ventricular intracranial (communicating) shunt, initial encounter: Secondary | ICD-10-CM | POA: Diagnosis not present

## 2015-07-07 DIAGNOSIS — Q039 Congenital hydrocephalus, unspecified: Secondary | ICD-10-CM | POA: Diagnosis not present

## 2015-07-07 DIAGNOSIS — K219 Gastro-esophageal reflux disease without esophagitis: Secondary | ICD-10-CM | POA: Diagnosis not present

## 2015-07-07 DIAGNOSIS — N390 Urinary tract infection, site not specified: Secondary | ICD-10-CM | POA: Diagnosis not present

## 2015-07-07 DIAGNOSIS — R519 Headache, unspecified: Secondary | ICD-10-CM

## 2015-07-07 DIAGNOSIS — G40901 Epilepsy, unspecified, not intractable, with status epilepticus: Secondary | ICD-10-CM | POA: Diagnosis not present

## 2015-07-07 DIAGNOSIS — Z4682 Encounter for fitting and adjustment of non-vascular catheter: Secondary | ICD-10-CM | POA: Diagnosis not present

## 2015-07-07 DIAGNOSIS — Z87448 Personal history of other diseases of urinary system: Secondary | ICD-10-CM | POA: Insufficient documentation

## 2015-07-07 DIAGNOSIS — G9389 Other specified disorders of brain: Secondary | ICD-10-CM | POA: Diagnosis not present

## 2015-07-07 DIAGNOSIS — T8501XA Breakdown (mechanical) of ventricular intracranial (communicating) shunt, initial encounter: Secondary | ICD-10-CM | POA: Diagnosis not present

## 2015-07-07 DIAGNOSIS — Z87728 Personal history of other specified (corrected) congenital malformations of nervous system and sense organs: Secondary | ICD-10-CM | POA: Insufficient documentation

## 2015-07-07 DIAGNOSIS — Q054 Unspecified spina bifida with hydrocephalus: Secondary | ICD-10-CM | POA: Diagnosis not present

## 2015-07-07 DIAGNOSIS — R0689 Other abnormalities of breathing: Secondary | ICD-10-CM | POA: Diagnosis not present

## 2015-07-07 DIAGNOSIS — J189 Pneumonia, unspecified organism: Secondary | ICD-10-CM | POA: Diagnosis not present

## 2015-07-07 DIAGNOSIS — Z8249 Family history of ischemic heart disease and other diseases of the circulatory system: Secondary | ICD-10-CM | POA: Diagnosis not present

## 2015-07-07 DIAGNOSIS — E876 Hypokalemia: Secondary | ICD-10-CM | POA: Diagnosis not present

## 2015-07-07 DIAGNOSIS — Z91040 Latex allergy status: Secondary | ICD-10-CM | POA: Diagnosis not present

## 2015-07-07 DIAGNOSIS — I129 Hypertensive chronic kidney disease with stage 1 through stage 4 chronic kidney disease, or unspecified chronic kidney disease: Secondary | ICD-10-CM | POA: Diagnosis not present

## 2015-07-07 DIAGNOSIS — R569 Unspecified convulsions: Secondary | ICD-10-CM | POA: Diagnosis not present

## 2015-07-07 DIAGNOSIS — Z8719 Personal history of other diseases of the digestive system: Secondary | ICD-10-CM | POA: Diagnosis not present

## 2015-07-07 DIAGNOSIS — E878 Other disorders of electrolyte and fluid balance, not elsewhere classified: Secondary | ICD-10-CM | POA: Diagnosis not present

## 2015-07-07 DIAGNOSIS — R000 Tachycardia, unspecified: Secondary | ICD-10-CM | POA: Diagnosis not present

## 2015-07-07 DIAGNOSIS — L89313 Pressure ulcer of right buttock, stage 3: Secondary | ICD-10-CM | POA: Diagnosis not present

## 2015-07-07 DIAGNOSIS — I10 Essential (primary) hypertension: Secondary | ICD-10-CM | POA: Insufficient documentation

## 2015-07-07 DIAGNOSIS — N189 Chronic kidney disease, unspecified: Secondary | ICD-10-CM | POA: Diagnosis not present

## 2015-07-07 DIAGNOSIS — T85618D Breakdown (mechanical) of other specified internal prosthetic devices, implants and grafts, subsequent encounter: Secondary | ICD-10-CM | POA: Diagnosis not present

## 2015-07-07 DIAGNOSIS — T85698A Other mechanical complication of other specified internal prosthetic devices, implants and grafts, initial encounter: Secondary | ICD-10-CM | POA: Diagnosis not present

## 2015-07-07 DIAGNOSIS — Q0700 Arnold-Chiari syndrome without spina bifida or hydrocephalus: Secondary | ICD-10-CM | POA: Diagnosis not present

## 2015-07-07 DIAGNOSIS — Z8774 Personal history of (corrected) congenital malformations of heart and circulatory system: Secondary | ICD-10-CM | POA: Insufficient documentation

## 2015-07-07 DIAGNOSIS — R51 Headache: Secondary | ICD-10-CM | POA: Diagnosis not present

## 2015-07-07 DIAGNOSIS — Z833 Family history of diabetes mellitus: Secondary | ICD-10-CM | POA: Diagnosis not present

## 2015-07-07 DIAGNOSIS — I313 Pericardial effusion (noninflammatory): Secondary | ICD-10-CM | POA: Diagnosis not present

## 2015-07-07 DIAGNOSIS — Z932 Ileostomy status: Secondary | ICD-10-CM | POA: Diagnosis not present

## 2015-07-07 DIAGNOSIS — G919 Hydrocephalus, unspecified: Secondary | ICD-10-CM | POA: Diagnosis not present

## 2015-07-07 DIAGNOSIS — Z79899 Other long term (current) drug therapy: Secondary | ICD-10-CM | POA: Diagnosis not present

## 2015-07-07 DIAGNOSIS — J9600 Acute respiratory failure, unspecified whether with hypoxia or hypercapnia: Secondary | ICD-10-CM | POA: Diagnosis not present

## 2015-07-07 DIAGNOSIS — T85618A Breakdown (mechanical) of other specified internal prosthetic devices, implants and grafts, initial encounter: Secondary | ICD-10-CM

## 2015-07-07 DIAGNOSIS — Z9049 Acquired absence of other specified parts of digestive tract: Secondary | ICD-10-CM | POA: Diagnosis not present

## 2015-07-07 DIAGNOSIS — Y829 Unspecified medical devices associated with adverse incidents: Secondary | ICD-10-CM | POA: Diagnosis not present

## 2015-07-07 DIAGNOSIS — J9690 Respiratory failure, unspecified, unspecified whether with hypoxia or hypercapnia: Secondary | ICD-10-CM | POA: Diagnosis not present

## 2015-07-07 DIAGNOSIS — J95821 Acute postprocedural respiratory failure: Secondary | ICD-10-CM | POA: Diagnosis not present

## 2015-07-07 DIAGNOSIS — Z452 Encounter for adjustment and management of vascular access device: Secondary | ICD-10-CM | POA: Diagnosis not present

## 2015-07-07 DIAGNOSIS — T85610A Breakdown (mechanical) of epidural and subdural infusion catheter, initial encounter: Secondary | ICD-10-CM | POA: Diagnosis not present

## 2015-07-07 DIAGNOSIS — Z872 Personal history of diseases of the skin and subcutaneous tissue: Secondary | ICD-10-CM | POA: Diagnosis not present

## 2015-07-07 DIAGNOSIS — Z9911 Dependence on respirator [ventilator] status: Secondary | ICD-10-CM | POA: Diagnosis not present

## 2015-07-07 DIAGNOSIS — I959 Hypotension, unspecified: Secondary | ICD-10-CM | POA: Diagnosis not present

## 2015-07-07 DIAGNOSIS — L89152 Pressure ulcer of sacral region, stage 2: Secondary | ICD-10-CM | POA: Diagnosis not present

## 2015-07-07 DIAGNOSIS — G8220 Paraplegia, unspecified: Secondary | ICD-10-CM | POA: Diagnosis not present

## 2015-07-07 DIAGNOSIS — G801 Spastic diplegic cerebral palsy: Secondary | ICD-10-CM | POA: Diagnosis not present

## 2015-07-07 DIAGNOSIS — Z982 Presence of cerebrospinal fluid drainage device: Secondary | ICD-10-CM | POA: Diagnosis not present

## 2015-07-07 DIAGNOSIS — Z9289 Personal history of other medical treatment: Secondary | ICD-10-CM

## 2015-07-07 DIAGNOSIS — G40409 Other generalized epilepsy and epileptic syndromes, not intractable, without status epilepticus: Secondary | ICD-10-CM | POA: Diagnosis not present

## 2015-07-07 DIAGNOSIS — L89312 Pressure ulcer of right buttock, stage 2: Secondary | ICD-10-CM | POA: Diagnosis not present

## 2015-07-07 DIAGNOSIS — Q059 Spina bifida, unspecified: Secondary | ICD-10-CM | POA: Diagnosis not present

## 2015-07-07 LAB — COMPREHENSIVE METABOLIC PANEL
ALT: 72 U/L — ABNORMAL HIGH (ref 17–63)
ANION GAP: 12 (ref 5–15)
AST: 56 U/L — ABNORMAL HIGH (ref 15–41)
Albumin: 3.8 g/dL (ref 3.5–5.0)
Alkaline Phosphatase: 96 U/L (ref 38–126)
BUN: 16 mg/dL (ref 6–20)
CO2: 26 mmol/L (ref 22–32)
Calcium: 9.5 mg/dL (ref 8.9–10.3)
Chloride: 101 mmol/L (ref 101–111)
Creatinine, Ser: 0.74 mg/dL (ref 0.61–1.24)
GFR calc Af Amer: >60 mL/min (ref >60)
GFR calc non Af Amer: >60 mL/min (ref >60)
GLUCOSE: 125 mg/dL — AB (ref 65–99)
POTASSIUM: 3.9 mmol/L (ref 3.5–5.1)
SODIUM: 139 mmol/L (ref 135–145)
Total Bilirubin: 0.4 mg/dL (ref 0.3–1.2)
Total Protein: 8.3 g/dL — ABNORMAL HIGH (ref 6.5–8.1)

## 2015-07-07 LAB — URINALYSIS, ROUTINE W REFLEX MICROSCOPIC
BILIRUBIN URINE: NEGATIVE
Glucose, UA: NEGATIVE mg/dL
KETONES UR: NEGATIVE mg/dL
NITRITE: NEGATIVE
Protein, ur: 100 mg/dL — AB
Specific Gravity, Urine: 1.022 (ref 1.005–1.030)
UROBILINOGEN UA: 0.2 mg/dL (ref 0.0–1.0)
pH: 7 (ref 5.0–8.0)

## 2015-07-07 LAB — URINE MICROSCOPIC-ADD ON

## 2015-07-07 LAB — CBC WITH DIFFERENTIAL/PLATELET
BASOS PCT: 0 %
Basophils Absolute: 0 10*3/uL (ref 0.0–0.1)
EOS ABS: 0 10*3/uL (ref 0.0–0.7)
Eosinophils Relative: 0 %
HCT: 35.2 % — ABNORMAL LOW (ref 39.0–52.0)
Hemoglobin: 10.8 g/dL — ABNORMAL LOW (ref 13.0–17.0)
Lymphocytes Relative: 8 %
Lymphs Abs: 1 10*3/uL (ref 0.7–4.0)
MCH: 23.8 pg — ABNORMAL LOW (ref 26.0–34.0)
MCHC: 30.7 g/dL (ref 30.0–36.0)
MCV: 77.7 fL — ABNORMAL LOW (ref 78.0–100.0)
MONOS PCT: 4 %
Monocytes Absolute: 0.5 10*3/uL (ref 0.1–1.0)
NEUTROS PCT: 88 %
Neutro Abs: 10.9 10*3/uL — ABNORMAL HIGH (ref 1.7–7.7)
Platelets: 317 10*3/uL (ref 150–400)
RBC: 4.53 MIL/uL (ref 4.22–5.81)
RDW: 15.4 % (ref 11.5–15.5)
WBC: 12.4 10*3/uL — ABNORMAL HIGH (ref 4.0–10.5)

## 2015-07-07 LAB — CBG MONITORING, ED: GLUCOSE-CAPILLARY: 148 mg/dL — AB (ref 65–99)

## 2015-07-07 MED ORDER — SUCCINYLCHOLINE CHLORIDE 20 MG/ML IJ SOLN
INTRAMUSCULAR | Status: AC
  Administered 2015-07-07: 100 mg
  Filled 2015-07-07: qty 1

## 2015-07-07 MED ORDER — IBUPROFEN 200 MG PO TABS
600.0000 mg | ORAL_TABLET | Freq: Once | ORAL | Status: AC
  Administered 2015-07-07: 600 mg via ORAL
  Filled 2015-07-07: qty 3

## 2015-07-07 MED ORDER — PROPOFOL 1000 MG/100ML IV EMUL
INTRAVENOUS | Status: AC
  Filled 2015-07-07: qty 100

## 2015-07-07 MED ORDER — LORAZEPAM 2 MG/ML IJ SOLN
2.0000 mg | Freq: Once | INTRAMUSCULAR | Status: AC
  Administered 2015-07-07: 2 mg via INTRAVENOUS

## 2015-07-07 MED ORDER — DEXTROSE 5 % IV SOLN
1.0000 g | Freq: Once | INTRAVENOUS | Status: AC
  Administered 2015-07-07: 1 g via INTRAVENOUS
  Filled 2015-07-07: qty 10

## 2015-07-07 MED ORDER — LEVETIRACETAM 500 MG/5ML IV SOLN
1000.0000 mg | Freq: Two times a day (BID) | INTRAVENOUS | Status: DC
  Filled 2015-07-07: qty 10

## 2015-07-07 MED ORDER — ETOMIDATE 2 MG/ML IV SOLN
INTRAVENOUS | Status: AC
  Administered 2015-07-07: 20 mg
  Filled 2015-07-07: qty 20

## 2015-07-07 MED ORDER — LIDOCAINE HCL (CARDIAC) 20 MG/ML IV SOLN
INTRAVENOUS | Status: AC
  Filled 2015-07-07: qty 5

## 2015-07-07 MED ORDER — LORAZEPAM 2 MG/ML IJ SOLN
INTRAMUSCULAR | Status: AC
  Administered 2015-07-07: 2 mg
  Filled 2015-07-07: qty 1

## 2015-07-07 MED ORDER — ROCURONIUM BROMIDE 50 MG/5ML IV SOLN
INTRAVENOUS | Status: AC
  Filled 2015-07-07: qty 2

## 2015-07-07 MED ORDER — LORAZEPAM 2 MG/ML IJ SOLN
INTRAMUSCULAR | Status: AC
  Administered 2015-07-07: 2 mg via INTRAVENOUS
  Filled 2015-07-07: qty 1

## 2015-07-07 MED ORDER — METOCLOPRAMIDE HCL 10 MG PO TABS
10.0000 mg | ORAL_TABLET | Freq: Once | ORAL | Status: AC
  Administered 2015-07-07: 10 mg via ORAL
  Filled 2015-07-07: qty 1

## 2015-07-07 MED ORDER — PROPOFOL 1000 MG/100ML IV EMUL: 5.0000 ug/kg/min | INTRAVENOUS | Status: DC

## 2015-07-07 NOTE — Progress Notes (Signed)
RT was called to the bedside for intubation. Patient tolerated intubation well. ETT is a 7.0 and 25 cmH2O at the lip. Bilateral BS noted, color change on CO2 dectector noted, and CXR was ordered. Patient was placed on PRVC 450/18/+5/100%. Vitals are stable and no distress noted. Plans to transfer to [facility].  RT will continue to monitor patient.

## 2015-07-07 NOTE — ED Notes (Signed)
[facility] pushed 2 mg of Lorazepam

## 2015-07-07 NOTE — ED Notes (Signed)
Patient here with complaints of headache x3 days. Nausea no vomiting.

## 2015-07-07 NOTE — ED Notes (Addendum)
Patient was on the stretcher to be transported to [facility] with [place] and he started actively seizing.  MD at bedside; requests 2 mg of ativan; administered by [facility]. Patient was not responding to touch or voice. Preparing to intubate.  Patient's O2 at 89% on room air

## 2015-07-07 NOTE — ED Notes (Signed)
Propofol infusing at 20 mcg/kg/min, no bolus given

## 2015-07-07 NOTE — ED Notes (Signed)
Report called to [facility] ER charge nurse

## 2015-07-07 NOTE — ED Notes (Signed)
Propofol started at 20 mcg/kg/min as per MD verbal order at 16:17. Vitals stable and maintaining, since then has been titrated up to 30, then back down to 20, now at 15 mcg/kg/min.

## 2015-07-07 NOTE — ED Notes (Signed)
Patient was transported to [facility] via [place].

## 2015-07-07 NOTE — ED Provider Notes (Addendum)
CSN: [id number]     Arrival date & time 07/07/15  1006 History   First MD Initiated Contact with Patient 07/07/15 1013     Chief Complaint  Patient presents with  . Headache     (Consider location/radiation/quality/duration/timing/severity/associated sxs/prior Treatment) HPI 34 year old male who presents with headache. 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 This is complicated by cognitive impairment, paraplegia, neurogenic bladder. He was recently admitted to the hospital twice in February 2016 and subsequently transferred to [facility] for management of headaches and recurrent UTI. It was found at that time that he had partial malfunction of valve involving his shunt, which was adjusted by his neurosurgeon. 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 He has recently followed up in clinic in September 2016 and referred to neurology, which she has appointment with in December. Mother continues to report that he has recurrent headaches, similar in nature to prior. Typically, headaches last for less than a day, and associated with him "blanking out". This she reports that this current headache has been ongoing for 3 days, not improved with home Tylenol. He has not had "blanking out" episodes with this, but reports that he just wants to go to sleep. they deny nausea, vomiting, altered mental status/confusion, vision changes, speech changes, new numbness or weakness. He has not had any associating fevers or chills, cough, difficulty breathing, abdominal pain, abnormal ostomy output, but mother has noted that his urine appeared dark recently.  Past Medical History  Diagnosis Date  . Hypertension   . GERD (gastroesophageal reflux disease)   . Non-healing surgical wound     ABDOMINE  . Spina bifida with hydrocephalus, lumbar region ([place])      CONGENITAL--  HAS VP SHUNT--  PARALYZIED MID ABDOMINE DOWN  . Arnold-Chiari malformation ([place])   . Neurogenic bladder     INCONTINENT -  . 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 Ileostomy in place [facility])   . Wheelchair bound     CAN TRASFER SELF WITHOUT BOARD  . Urinary incontinence with continuous leakage   . Wears glasses   . Pressure ulcer, buttock   . Borderline diabetes     DIET CONTROLLED  . Heart palpitations     SECONDARY TO VP SHUNT DRAIN  . History of seizure as newborn     NO MEDS SINCE 34 YRS OLD--  NO ISSUES SINCE  . Neurogenic bowel   . At risk for sleep apnea     STOP-BANG = 4   SENT TO PCP 11-01-2013   Past Surgical History  Procedure Laterality Date  . Ventriculoperitoneal shunt  LAST REVISION NOV 2014    DUE TO ABD WOUND--- SHUNT DRAINS TO HEART  . Hip surgery Bilateral AS CHILD    TENDON RELEASE  . Cholecystectomy N/A 07/06/2013    Procedure: LAPAROSCOPIC CHOLECYSTECTOMY WITH INTRAOPERATIVE CHOLANGIOGRAM;  Surgeon: Madilyn Hook, DO;  Location: [facility];  Service: General;  Laterality: N/A;  . Colon resection N/A 07/08/2013    Procedure: EXPLORATORY LAPAROSCOPY, DIAGNOSTIC LAPAROSCOPY, PARTIAL COLECTOMY, ABDOMINAL WASHOUT, ABDOMINAL WOUND VAC;  Surgeon: Madilyn Hook, DO;  Location: [facility];  Service: General;  Laterality: N/A;  . Spinal fixation surgery w/ implant  AS CHILD    [place] RODS  . 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 Umbilical hernia  repair  03-30-2007  . Right colectomy/  appendectomy/  ileostomy  NOV 2014  [facility]  . Incision and drainage of wound N/A 11/04/2013    Procedure: IRRIGATION AND DEBRIDEMENT ABDOMINAL WOUND WITH PLACEMENT OF ACELL/VAC;  Surgeon: Theodoro Kos, DO;  Location: [place];  Service: Plastics;  Laterality: N/A;   Family History  Problem Relation Age of Onset  . Diabetes Other   . Hypertension Other   . Cancer Other   . Stroke Other    Social  History  Substance Use Topics  . Smoking status: Never Smoker   . Smokeless tobacco: Never Used  . Alcohol Use: No    Review of Systems 10/14 systems reviewed and are negative other than those stated in the HPI   Allergies  Latex; Adhesive; Ciprofloxacin; and Vancomycin  Home Medications   Prior to Admission medications   Medication Sig Start Date End Date Taking? Authorizing Provider  acetaminophen (TYLENOL) 650 MG CR tablet Take 1,300 mg by mouth every 8 (eight) hours as needed for pain.   Yes Historical Provider, MD  Cranberry 400 MG CAPS Take 400 mg by mouth daily.   Yes Historical Provider, MD  KLOR-CON M20 20 MEQ tablet Take 20 mEq by mouth 2 (two) times daily. 06/23/15  Yes Historical Provider, MD  metoprolol (LOPRESSOR) 50 MG tablet Take 1 tablet (50 mg total) by mouth 2 (two) times daily. 11/03/14  Yes Reyne Dumas, MD  Multiple Vitamin (MULTIVITAMIN) tablet Take 1 tablet by mouth daily.   Yes Historical Provider, MD  ondansetron (ZOFRAN) 4 MG tablet Take 4 mg by mouth every 8 (eight) hours as needed for nausea or vomiting.   Yes Historical Provider, MD  oxyCODONE (OXY IR/ROXICODONE) 5 MG immediate release tablet Take 1 tablet (5 mg total) by mouth every 6 (six) hours as needed. 1 by mouth every 3 hours as needed for MILD pain, take 2 by mouth every 3 hours as needed for MODERATE pain, 3 by mouth every 3 hours as needed for SEVERE pain 10/30/14  Yes Reyne Dumas, MD  potassium chloride 20 MEQ TBCR Take 20 mEq by mouth daily. Patient taking differently: Take 20 mEq by mouth 2 (two) times daily.  10/30/14  Yes Reyne Dumas, MD  Wound Dressings (PROMOGRAN EX) Apply 1 application topically daily as needed (bedsores). Tear off 1 piece of promogran, apply normal saline to promogran,then adhere to skin and cover   Yes Historical Provider, MD  cefTRIAXone 1 g in dextrose 5 % 50 mL Inject 1 g into the vein once. Patient not taking: Reported on 11/24/2014 11/07/14   Reyne Dumas, MD  feeding  supplement, ENSURE COMPLETE, (ENSURE COMPLETE) LIQD Take 237 mLs by mouth 2 (two) times daily between meals. Patient not taking: Reported on 11/24/2014 10/30/14   Reyne Dumas, MD  magnesium oxide (MAG-OX) 400 MG tablet Take 1 tablet (400 mg total) by mouth 2 (two) times daily. Patient not taking: Reported on 11/24/2014 10/30/14   Reyne Dumas, MD  SANTYL ointment Apply 1 application topically daily as needed (bed sores).  06/15/15   Historical Provider, MD   BP 157/84 mmHg  Pulse 64  Temp(Src) 98.7 F (37.1 C) (Oral)  Resp 18  SpO2 100% Physical Exam Physical Exam  Nursing note and vitals reviewed. Constitutional: non-toxic, and in no acute distress Head: Atraumatic.  Eyes: PERRL. Normal conjunctiva.  Mouth/Throat: Oropharynx is clear and moist.  Neck: Normal range of motion. Neck supple.  Cardiovascular: Normal rate and regular rhythm.  No edema. Pulmonary/Chest: Effort normal and breath sounds normal.  Abdominal: Soft. There is ostomy in the RLQ, with normal stool output and gas. There is no tenderness. There is no rebound and no guarding.  Musculoskeletal: Normal range of motion.  Neurological: Alert, appropriate speech, full strength in upper extremities bilaterally, sensation to light touch in tact in bilateral upper extremities. Skin: Skin is warm and dry.  Psychiatric: Cooperative  ED Course  Procedures (including critical care time) Labs Review Labs Reviewed  URINALYSIS, ROUTINE W REFLEX MICROSCOPIC (NOT AT [facility]) - Abnormal; Notable for the following:    Color, Urine AMBER (*)    APPearance TURBID (*)    Hgb urine dipstick TRACE (*)    Protein, ur 100 (*)    Leukocytes, UA LARGE (*)    All other components within normal limits  CBC WITH DIFFERENTIAL/PLATELET - Abnormal; Notable for the following:    WBC 12.4 (*)    Hemoglobin 10.8 (*)    HCT 35.2 (*)    MCV 77.7 (*)    MCH 23.8 (*)    Neutro Abs 10.9 (*)    All other components within normal limits  COMPREHENSIVE  METABOLIC PANEL - Abnormal; Notable for the following:    Glucose, Bld 125 (*)    Total Protein 8.3 (*)    AST 56 (*)    ALT 72 (*)    All other components within normal limits  URINE MICROSCOPIC-ADD ON - Abnormal; Notable for the following:    Bacteria, UA MANY (*)    All other components within normal limits  CBG MONITORING, ED - Abnormal; Notable for the following:    Glucose-Capillary 148 (*)    All other components within normal limits  URINE CULTURE    Imaging Review Dg Skull 1-3 Views  07/07/2015  CLINICAL DATA:  Headache for 3 days, shunt malfunction EXAM: SKULL - 1-3 VIEW COMPARISON:  10/25/2014 FINDINGS: There is no evidence of skull fracture or other focal bone lesions. Again noted 2 shunt catheters in right scalp and right soft tissue neck. Again noted 1 of the catheters blind ending. Stable a catheter fragment in right soft tissue neck. IMPRESSION: Again noted 2 shunt catheters in right scalp and right soft tissue neck. Again noted 1 of the catheters blind ending. Stable a catheter fragment in right soft tissue neck. Electronically Signed   By: Lahoma Crocker M.D.   On: 07/07/2015 12:15   Dg Chest 1 View  07/07/2015  CLINICAL DATA:  Hydrocephalus with ventriculoperitoneal shunt. Headache for 3 days. EXAM: CHEST 1 VIEW COMPARISON:  11/06/2014. FINDINGS: Trachea is midline. Thoracic anatomy is distorted by scoliosis. Lungs are grossly clear. IMPRESSION: Lungs are grossly clear. Electronically Signed   By: Lorin Picket M.D.   On: 07/07/2015 12:14   Ct Head Wo Contrast  07/07/2015  CLINICAL DATA:  Patient with headache for 3 days. 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 VP shunt. EXAM: CT HEAD WITHOUT CONTRAST TECHNIQUE: Contiguous axial images were obtained from the base of the skull through the vertex without intravenous contrast. COMPARISON:  Brain CT 11/06/2014 FINDINGS: Right posterior parietal approach VP shunt catheter is present with tip  terminating in the anterior aspect of the left lateral ventricle. The ventriculostomy catheter is disconnected from the reservoir along the lateral aspect of the skull (image 18; series 2). Re- demonstrated diffuse ventriculomegaly of the lateral and third ventricles. There is diffuse periventricular hypodensity involving the lateral ventricles, most compatible with transependymal flow  of CSF. No significant mass effect or midline shift. Agenesis of the corpus callosum. Unchanged calcification along the posterior aspect of the falx. No extra-axial fluid collections. No intracranial hemorrhage, mass lesion or evidence for acute cortically based infarct. The cerebellar tonsils are present within the foramen magnum, unchanged. IMPRESSION: The ventriculostomy catheter is disconnected from the reservoir at the junction along the lateral skull. Persistent dilatation of the lateral and third ventricles. There is periventricular white matter hypodensity about the lateral ventricles most suggestive of transependymal flow of CSF. These results were called by telephone at the time of interpretation on 07/07/2015 at 12:56 pm to Dr. Brantley Stage , who verbally acknowledged these results. Electronically Signed   By: Lovey Newcomer M.D.   On: 07/07/2015 13:52   Dg Chest Port 1 View  07/07/2015  CLINICAL DATA:  Patient status post ET tube placement. EXAM: PORTABLE CHEST 1 VIEW COMPARISON:  Chest radiograph 07/07/2015 FINDINGS: Interval insertion ET tube terminating in the mid trachea. Stable cardiac and mediastinal contours. No consolidative pulmonary opacities. No pleural effusion or pneumothorax. IMPRESSION: ET tube terminates in the mid trachea. Electronically Signed   By: Lovey Newcomer M.D.   On: 07/07/2015 16:30   I have personally reviewed and evaluated these images and lab results as part of my medical decision-making.  INTUBATION Performed by: Forde Dandy  Required items: required blood products, implants, devices, and  special equipment available Patient identity confirmed: provided demographic data and hospital-assigned identification number Time out: Immediately prior to procedure a "time out" was called to verify the correct patient, procedure, equipment, support staff and site/side marked as required.  Indications: airway protection  Intubation method: Glidescope Laryngoscopy   Preoxygenation: BVM  Sedatives: 20 mg Etomidate Paralytic: 100 mg Succinylcholine  Tube Size: 7.0 cuffed  Post-procedure assessment: chest rise and ETCO2 monitor Breath sounds: equal and absent over the epigastrium Tube secured with: ETT holder Chest x-ray interpreted by radiologist and me.  Chest x-ray findings: endotracheal tube in appropriate position  Patient tolerated the procedure well with no immediate complications.     CRITICAL CARE Performed by: Forde Dandy   Total critical care time: 35 minutes  Critical care time was exclusive of separately billable procedures and treating other patients.  Critical care was necessary to treat or prevent imminent or life-threatening deterioration.  Critical care was time spent personally by me on the following activities: development of treatment plan with patient and/or surrogate as well as nursing, discussions with consultants, evaluation of patient's response to treatment, examination of patient, obtaining history from patient or surrogate, ordering and performing treatments and interventions, ordering and review of laboratory studies, ordering and review of radiographic studies, pulse oximetry and re-evaluation of patient's condition.   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 Appears to be at mental status  baseline on arrival. Hemodynamically stable and afebrile. Grossly at neurological baseline. Blood work notable for mild leukocytosis of 12. No other major electrolyte or metabolic derangements.  Evidence of persistent UTI on catheterized UA. Covered with 1 g ceftriaxone, sensitive on past urine cultures. CT head and shunt series performed. Imaging studies visualized and reviewed with radiology. There is concern for shunt malfunction, and shunt appears to be disconnected on skull x-ray. Ventricular size  is normal, and he remained hemodynamically and neurologically stable.  Discussed with Dr. Willette Alma were from neurosurgery, who requests that patient be transferred to [place] ED for evaluation by neurosurgery there.  Aceppted for transfer by Dr. Abby Potash at [facility]  Was called to room at around 1540 by [facility] for concern for seizure. Patient with sonorous breath sounds, unresponsive, and with extensor posturing in the upper extremities. Was tachycardic and hypoxic 50-70% on RA requiring BVM. Continued to have tachycardia, hypoxia and posturing despite repeat doses of ativan. Blood glucose 140s. Subsequently intubated. Loaded with 1 g of Keppra and sedation with propofol started. Updated Dr. Willette Alma and [facility] ED. Felt stabilized for transfer to [facility].     Forde Dandy, MD 07/07/15 [place] Latayvia Mandujano, MD 07/07/15 [place] Vand[place]ort, MD 07/07/15 [phone]

## 2015-07-09 LAB — URINE CULTURE

## 2015-07-10 ENCOUNTER — Telehealth (HOSPITAL_BASED_OUTPATIENT_CLINIC_OR_DEPARTMENT_OTHER): Payer: Self-pay | Admitting: Emergency Medicine

## 2015-07-10 NOTE — Progress Notes (Signed)
ED Antimicrobial Stewardship Positive Culture Follow Up   [name] is an 34 y.o. male who presented to [facility] on 07/07/2015 with a chief complaint of  Chief Complaint  Patient presents with  . Headache    Recent Results (from the past 720 hour(s))  Urine culture     Status: None   Collection Time: 07/07/15 12:40 PM  Result Value Ref Range Status   Specimen Description URINE, CATHETERIZED  Final   Special Requests NONE  Final   Culture   Final    >=100,000 COLONIES/mL ESCHERICHIA COLI Performed at [facility]    Report Status 07/09/2015 FINAL  Final   Organism ID, Bacteria ESCHERICHIA COLI  Final      Susceptibility   Escherichia coli - MIC*    AMPICILLIN >=32 RESISTANT Resistant     CEFAZOLIN <=4 SENSITIVE Sensitive     CEFTRIAXONE <=1 SENSITIVE Sensitive     CIPROFLOXACIN <=0.25 SENSITIVE Sensitive     GENTAMICIN >=16 RESISTANT Resistant     IMIPENEM <=0.25 SENSITIVE Sensitive     NITROFURANTOIN <=16 SENSITIVE Sensitive     TRIMETH/SULFA >=320 RESISTANT Resistant     AMPICILLIN/SULBACTAM 16 INTERMEDIATE Intermediate     PIP/TAZO <=4 SENSITIVE Sensitive     * >=100,000 COLONIES/mL ESCHERICHIA COLI    ED Provider: Alvina Chou, PA-C  Assessment: 21 yoM presenting to ED with HA. 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 Patient was transferred to [facility] to fax positive UCx to Elwin Mocha, PharmD Clinical Pharmacist- Resident Pager: [phone]  Darl Pikes 07/10/2015, 9:10 AM Infectious Diseases Pharmacist Phone# [phone]

## 2015-07-10 NOTE — Telephone Encounter (Signed)
Post ED Visit - Positive Culture Follow-up  Culture report reviewed by antimicrobial stewardship pharmacist:  []  Heide Guile, Pharm.D., BCPS []  Alycia Rossetti, Pharm.D., BCPS []  [place], Pharm.D., BCPS, AAHIVP []  Legrand Como, Pharm.D., BCPS, AAHIVP []  [place], Pharm.D. []  Cassie Nicole Kindred, [place].D. Angela Burke PharmD  Positive urine culture  E.coli Treated with none, asymptomatic,and no further patient follow-up is required at this time.  Hazle Nordmann 07/10/2015, 10:10 AM

## 2015-07-14 DIAGNOSIS — I10 Essential (primary) hypertension: Secondary | ICD-10-CM | POA: Diagnosis not present

## 2015-07-14 DIAGNOSIS — Z4800 Encounter for change or removal of nonsurgical wound dressing: Secondary | ICD-10-CM | POA: Diagnosis not present

## 2015-07-14 DIAGNOSIS — Z932 Ileostomy status: Secondary | ICD-10-CM | POA: Diagnosis not present

## 2015-07-14 DIAGNOSIS — L89312 Pressure ulcer of right buttock, stage 2: Secondary | ICD-10-CM | POA: Diagnosis not present

## 2015-07-14 DIAGNOSIS — L89222 Pressure ulcer of left hip, stage 2: Secondary | ICD-10-CM | POA: Diagnosis not present

## 2015-07-14 DIAGNOSIS — Q059 Spina bifida, unspecified: Secondary | ICD-10-CM | POA: Diagnosis not present

## 2015-07-14 DIAGNOSIS — G8220 Paraplegia, unspecified: Secondary | ICD-10-CM | POA: Diagnosis not present

## 2015-07-16 DIAGNOSIS — L89312 Pressure ulcer of right buttock, stage 2: Secondary | ICD-10-CM | POA: Diagnosis not present

## 2015-07-16 DIAGNOSIS — G8220 Paraplegia, unspecified: Secondary | ICD-10-CM | POA: Diagnosis not present

## 2015-07-16 DIAGNOSIS — I10 Essential (primary) hypertension: Secondary | ICD-10-CM | POA: Diagnosis not present

## 2015-07-16 DIAGNOSIS — Z4800 Encounter for change or removal of nonsurgical wound dressing: Secondary | ICD-10-CM | POA: Diagnosis not present

## 2015-07-16 DIAGNOSIS — Q059 Spina bifida, unspecified: Secondary | ICD-10-CM | POA: Diagnosis not present

## 2015-07-16 DIAGNOSIS — L89222 Pressure ulcer of left hip, stage 2: Secondary | ICD-10-CM | POA: Diagnosis not present

## 2015-07-17 DIAGNOSIS — Z48811 Encounter for surgical aftercare following surgery on the nervous system: Secondary | ICD-10-CM | POA: Diagnosis not present

## 2015-07-17 DIAGNOSIS — Q039 Congenital hydrocephalus, unspecified: Secondary | ICD-10-CM | POA: Diagnosis not present

## 2015-07-17 DIAGNOSIS — Z4802 Encounter for removal of sutures: Secondary | ICD-10-CM | POA: Diagnosis not present

## 2015-07-17 DIAGNOSIS — Z982 Presence of cerebrospinal fluid drainage device: Secondary | ICD-10-CM | POA: Diagnosis not present

## 2015-07-20 DIAGNOSIS — Z4800 Encounter for change or removal of nonsurgical wound dressing: Secondary | ICD-10-CM | POA: Diagnosis not present

## 2015-07-20 DIAGNOSIS — L89222 Pressure ulcer of left hip, stage 2: Secondary | ICD-10-CM | POA: Diagnosis not present

## 2015-07-20 DIAGNOSIS — G8220 Paraplegia, unspecified: Secondary | ICD-10-CM | POA: Diagnosis not present

## 2015-07-20 DIAGNOSIS — Q059 Spina bifida, unspecified: Secondary | ICD-10-CM | POA: Diagnosis not present

## 2015-07-20 DIAGNOSIS — L89312 Pressure ulcer of right buttock, stage 2: Secondary | ICD-10-CM | POA: Diagnosis not present

## 2015-07-20 DIAGNOSIS — I10 Essential (primary) hypertension: Secondary | ICD-10-CM | POA: Diagnosis not present

## 2015-07-21 ENCOUNTER — Encounter (HOSPITAL_BASED_OUTPATIENT_CLINIC_OR_DEPARTMENT_OTHER): Payer: Medicare Other | Attending: General Surgery

## 2015-07-21 DIAGNOSIS — L89892 Pressure ulcer of other site, stage 2: Secondary | ICD-10-CM | POA: Diagnosis not present

## 2015-07-21 DIAGNOSIS — L89313 Pressure ulcer of right buttock, stage 3: Secondary | ICD-10-CM | POA: Diagnosis not present

## 2015-07-21 DIAGNOSIS — I10 Essential (primary) hypertension: Secondary | ICD-10-CM | POA: Insufficient documentation

## 2015-07-21 DIAGNOSIS — Q059 Spina bifida, unspecified: Secondary | ICD-10-CM | POA: Diagnosis not present

## 2015-07-21 DIAGNOSIS — G8220 Paraplegia, unspecified: Secondary | ICD-10-CM | POA: Diagnosis not present

## 2015-07-22 ENCOUNTER — Emergency Department (HOSPITAL_COMMUNITY): Payer: Medicare Other

## 2015-07-22 ENCOUNTER — Inpatient Hospital Stay (HOSPITAL_COMMUNITY)
Admission: EM | Admit: 2015-07-22 | Discharge: 2015-07-25 | DRG: 871 | Disposition: A | Discharge status: Home Health | Payer: Medicare Other | Attending: Internal Medicine | Admitting: Internal Medicine

## 2015-07-22 ENCOUNTER — Encounter (HOSPITAL_COMMUNITY): Payer: Self-pay | Admitting: Emergency Medicine

## 2015-07-22 DIAGNOSIS — K592 Neurogenic bowel, not elsewhere classified: Secondary | ICD-10-CM | POA: Diagnosis not present

## 2015-07-22 DIAGNOSIS — R6520 Severe sepsis without septic shock: Secondary | ICD-10-CM | POA: Diagnosis not present

## 2015-07-22 DIAGNOSIS — Z932 Ileostomy status: Secondary | ICD-10-CM

## 2015-07-22 DIAGNOSIS — Z993 Dependence on wheelchair: Secondary | ICD-10-CM

## 2015-07-22 DIAGNOSIS — G808 Other cerebral palsy: Secondary | ICD-10-CM | POA: Diagnosis not present

## 2015-07-22 DIAGNOSIS — E871 Hypo-osmolality and hyponatremia: Secondary | ICD-10-CM | POA: Diagnosis present

## 2015-07-22 DIAGNOSIS — I10 Essential (primary) hypertension: Secondary | ICD-10-CM | POA: Diagnosis not present

## 2015-07-22 DIAGNOSIS — D649 Anemia, unspecified: Secondary | ICD-10-CM | POA: Diagnosis not present

## 2015-07-22 DIAGNOSIS — A419 Sepsis, unspecified organism: Secondary | ICD-10-CM | POA: Diagnosis not present

## 2015-07-22 DIAGNOSIS — K219 Gastro-esophageal reflux disease without esophagitis: Secondary | ICD-10-CM | POA: Diagnosis present

## 2015-07-22 DIAGNOSIS — Q0700 Arnold-Chiari syndrome without spina bifida or hydrocephalus: Secondary | ICD-10-CM | POA: Diagnosis not present

## 2015-07-22 DIAGNOSIS — E875 Hyperkalemia: Secondary | ICD-10-CM | POA: Diagnosis not present

## 2015-07-22 DIAGNOSIS — N179 Acute kidney failure, unspecified: Secondary | ICD-10-CM

## 2015-07-22 DIAGNOSIS — Z9889 Other specified postprocedural states: Secondary | ICD-10-CM

## 2015-07-22 DIAGNOSIS — N3945 Continuous leakage: Secondary | ICD-10-CM | POA: Diagnosis present

## 2015-07-22 DIAGNOSIS — D75839 Thrombocytosis, unspecified: Secondary | ICD-10-CM | POA: Diagnosis present

## 2015-07-22 DIAGNOSIS — E869 Volume depletion, unspecified: Secondary | ICD-10-CM | POA: Diagnosis not present

## 2015-07-22 DIAGNOSIS — N319 Neuromuscular dysfunction of bladder, unspecified: Secondary | ICD-10-CM | POA: Diagnosis not present

## 2015-07-22 DIAGNOSIS — D72829 Elevated white blood cell count, unspecified: Secondary | ICD-10-CM

## 2015-07-22 DIAGNOSIS — IMO0002 Reserved for concepts with insufficient information to code with codable children: Secondary | ICD-10-CM

## 2015-07-22 DIAGNOSIS — D473 Essential (hemorrhagic) thrombocythemia: Secondary | ICD-10-CM

## 2015-07-22 DIAGNOSIS — N390 Urinary tract infection, site not specified: Secondary | ICD-10-CM | POA: Diagnosis not present

## 2015-07-22 DIAGNOSIS — Q0703 Arnold-Chiari syndrome with spina bifida and hydrocephalus: Secondary | ICD-10-CM

## 2015-07-22 DIAGNOSIS — R7303 Prediabetes: Secondary | ICD-10-CM | POA: Diagnosis present

## 2015-07-22 DIAGNOSIS — Z4682 Encounter for fitting and adjustment of non-vascular catheter: Secondary | ICD-10-CM | POA: Diagnosis not present

## 2015-07-22 DIAGNOSIS — Z982 Presence of cerebrospinal fluid drainage device: Secondary | ICD-10-CM | POA: Diagnosis not present

## 2015-07-22 DIAGNOSIS — B952 Enterococcus as the cause of diseases classified elsewhere: Secondary | ICD-10-CM | POA: Diagnosis not present

## 2015-07-22 DIAGNOSIS — R51 Headache: Secondary | ICD-10-CM | POA: Diagnosis not present

## 2015-07-22 DIAGNOSIS — E876 Hypokalemia: Secondary | ICD-10-CM | POA: Diagnosis not present

## 2015-07-22 DIAGNOSIS — R6521 Severe sepsis with septic shock: Secondary | ICD-10-CM | POA: Diagnosis present

## 2015-07-22 DIAGNOSIS — R140 Abdominal distension (gaseous): Secondary | ICD-10-CM | POA: Diagnosis not present

## 2015-07-22 LAB — I-STAT CG4 LACTIC ACID, ED: LACTIC ACID, VENOUS: 2.65 mmol/L — AB (ref 0.5–2.0)

## 2015-07-22 LAB — URINALYSIS, ROUTINE W REFLEX MICROSCOPIC
Glucose, UA: NEGATIVE mg/dL
Ketones, ur: 15 mg/dL — AB
Nitrite: NEGATIVE
Protein, ur: 100 mg/dL — AB
Specific Gravity, Urine: 1.028 (ref 1.005–1.030)
Urobilinogen, UA: 0.2 mg/dL (ref 0.0–1.0)
pH: 5 (ref 5.0–8.0)

## 2015-07-22 LAB — CBC
HCT: 43.4 % (ref 39.0–52.0)
Hemoglobin: 13.9 g/dL (ref 13.0–17.0)
MCH: 24 pg — ABNORMAL LOW (ref 26.0–34.0)
MCHC: 32 g/dL (ref 30.0–36.0)
MCV: 75.1 fL — ABNORMAL LOW (ref 78.0–100.0)
Platelets: 619 10*3/uL — ABNORMAL HIGH (ref 150–400)
RBC: 5.78 MIL/uL (ref 4.22–5.81)
RDW: 14.9 % (ref 11.5–15.5)
WBC: 25.8 10*3/uL — ABNORMAL HIGH (ref 4.0–10.5)

## 2015-07-22 LAB — BASIC METABOLIC PANEL
Anion gap: 17 — ABNORMAL HIGH (ref 5–15)
BUN: 41 mg/dL — ABNORMAL HIGH (ref 6–20)
CO2: 22 mmol/L (ref 22–32)
Calcium: 10.8 mg/dL — ABNORMAL HIGH (ref 8.9–10.3)
Chloride: 90 mmol/L — ABNORMAL LOW (ref 101–111)
Creatinine, Ser: 2.37 mg/dL — ABNORMAL HIGH (ref 0.61–1.24)
GFR calc Af Amer: 40 mL/min — ABNORMAL LOW (ref >60)
GFR calc non Af Amer: 35 mL/min — ABNORMAL LOW (ref >60)
Glucose, Bld: 125 mg/dL — ABNORMAL HIGH (ref 65–99)
Potassium: 5.5 mmol/L — ABNORMAL HIGH (ref 3.5–5.1)
Sodium: 129 mmol/L — ABNORMAL LOW (ref 135–145)

## 2015-07-22 LAB — URINE MICROSCOPIC-ADD ON

## 2015-07-22 MED ORDER — ACETAMINOPHEN 650 MG RE SUPP: 650.0000 mg | Freq: Four times a day (QID) | RECTAL | Status: DC | PRN

## 2015-07-22 MED ORDER — SODIUM CHLORIDE 0.9 % IV BOLUS (SEPSIS)
1000.0000 mL | Freq: Once | INTRAVENOUS | Status: AC
  Administered 2015-07-22: 1000 mL via INTRAVENOUS

## 2015-07-22 MED ORDER — PIPERACILLIN-TAZOBACTAM 3.375 G IVPB 30 MIN
3.3750 g | Freq: Once | INTRAVENOUS | Status: AC
  Administered 2015-07-22: 3.375 g via INTRAVENOUS
  Filled 2015-07-22: qty 50

## 2015-07-22 MED ORDER — SODIUM POLYSTYRENE SULFONATE 15 GM/60ML PO SUSP: 15.0000 g | Freq: Once | ORAL | Status: DC

## 2015-07-22 MED ORDER — ONDANSETRON HCL 4 MG/2ML IJ SOLN: 4.0000 mg | Freq: Four times a day (QID) | INTRAMUSCULAR | Status: DC | PRN

## 2015-07-22 MED ORDER — ENOXAPARIN SODIUM 40 MG/0.4ML ~~LOC~~ SOLN
40.0000 mg | Freq: Every day | SUBCUTANEOUS | Status: DC
  Administered 2015-07-23 – 2015-07-24 (×3): 40 mg via SUBCUTANEOUS
  Filled 2015-07-22 (×4): qty 0.4

## 2015-07-22 MED ORDER — CRANBERRY 400 MG PO CAPS: 400.0000 mg | ORAL_CAPSULE | Freq: Every day | ORAL | Status: DC

## 2015-07-22 MED ORDER — ACETAMINOPHEN 325 MG PO TABS
650.0000 mg | ORAL_TABLET | Freq: Four times a day (QID) | ORAL | Status: DC | PRN
  Administered 2015-07-23: 650 mg via ORAL
  Filled 2015-07-22: qty 2

## 2015-07-22 MED ORDER — ENOXAPARIN SODIUM 30 MG/0.3ML ~~LOC~~ SOLN: 30.0000 mg | Freq: Every day | SUBCUTANEOUS | Status: DC

## 2015-07-22 MED ORDER — PIPERACILLIN-TAZOBACTAM 3.375 G IVPB
3.3750 g | Freq: Three times a day (TID) | INTRAVENOUS | Status: DC
  Administered 2015-07-23 – 2015-07-24 (×4): 3.375 g via INTRAVENOUS
  Filled 2015-07-22 (×6): qty 50

## 2015-07-22 MED ORDER — DEXTROSE 5 % IV SOLN
1.0000 g | Freq: Once | INTRAVENOUS | Status: DC
  Administered 2015-07-22: 1 g via INTRAVENOUS
  Filled 2015-07-22: qty 10

## 2015-07-22 MED ORDER — OXYCODONE HCL 5 MG PO TABS: 5.0000 mg | ORAL_TABLET | ORAL | Status: DC | PRN

## 2015-07-22 MED ORDER — HYDROMORPHONE HCL 1 MG/ML IJ SOLN: 0.5000 mg | INTRAMUSCULAR | Status: DC | PRN

## 2015-07-22 MED ORDER — LINEZOLID 600 MG/300ML IV SOLN
600.0000 mg | Freq: Once | INTRAVENOUS | Status: AC
  Administered 2015-07-23: 600 mg via INTRAVENOUS
  Filled 2015-07-22: qty 300

## 2015-07-22 MED ORDER — SODIUM POLYSTYRENE SULFONATE 15 GM/60ML PO SUSP
15.0000 g | Freq: Once | ORAL | Status: AC
  Administered 2015-07-23: 15 g via ORAL
  Filled 2015-07-22: qty 60

## 2015-07-22 MED ORDER — ALUM & MAG HYDROXIDE-SIMETH 200-200-20 MG/5ML PO SUSP
30.0000 mL | Freq: Four times a day (QID) | ORAL | Status: DC | PRN
Start: 2015-07-22 — End: 2015-07-25

## 2015-07-22 MED ORDER — ONDANSETRON HCL 4 MG PO TABS: 4.0000 mg | ORAL_TABLET | Freq: Four times a day (QID) | ORAL | Status: DC | PRN

## 2015-07-22 MED ORDER — PIPERACILLIN-TAZOBACTAM 3.375 G IVPB 30 MIN: 3.3750 g | Freq: Three times a day (TID) | INTRAVENOUS | Status: DC

## 2015-07-22 MED ORDER — SODIUM CHLORIDE 0.9 % IV SOLN
INTRAVENOUS | Status: DC
  Administered 2015-07-22 – 2015-07-24 (×5): via INTRAVENOUS

## 2015-07-22 MED ORDER — ADULT MULTIVITAMIN W/MINERALS CH
1.0000 | ORAL_TABLET | Freq: Every day | ORAL | Status: DC
  Administered 2015-07-23 – 2015-07-25 (×3): 1 via ORAL
  Filled 2015-07-22 (×4): qty 1

## 2015-07-22 NOTE — ED Notes (Signed)
The patient's mother said her son just had a shunt placed at [facility] on July 10, 2015.  The patient went home, but was treated for a UTI there prior to leaving.  He had a follow up and had his staples removed and still did not have any problems.  The patient's mother said he woke up with a headache and was not acting right.  She does not know if he is having problems with his shunt or if it is a UTI again.  The patient's has no complaints.

## 2015-07-22 NOTE — H&P (Signed)
[facility] Admission History and Physical       [name]:[id number] DOB: Oct 07, 1980 DOA: 07/22/2015  Referring physician: EDP PCP: Elyn Peers, MD  Specialists:   Chief Complaint: Confusion, Headache  HPI: [name] is a 34 y.o. 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   He was evaluated and found to have hypotension with a BP of 74/61.  A Sepsis workup was initiated and his labs revealed AKI, and Hyperkalemia, and a Lactic Acidosis of 2.65.   Cultures of the urine and Blood were sent, and he was placed on IV Rocephin, and then expanded to IV Zosyn with a 1 time dose of Linezolid (due to a Vancomycin Allergy) and placed on IVFs for fluid resuscitation.  A Ct scan of the Head was negative for acute findings, and he was admitted to a Stepdown unit bed.      Review of Systems: Constitutional: No Weight Loss, No Weight Gain, Night Sweats, Fevers, Chills, Dizziness, Light Headedness, Fatigue, or Generalized Weakness HEENT:  +Headaches, Difficulty Swallowing,Tooth/Dental Problems,Sore Throat,  No Sneezing, Rhinitis, Ear Ache, Nasal Congestion, or Post Nasal Drip,  Cardio-vascular:  No Chest pain, Orthopnea, PND, Edema in Lower Extremities, Anasarca, Dizziness, Palpitations  Resp: No Dyspnea, No DOE, No Productive Cough, No Non-Productive Cough, No Hemoptysis, No Wheezing.    GI: No Heartburn, Indigestion, Abdominal Pain, Nausea, Vomiting, Diarrhea, Constipation, Hematemesis, Hematochezia, Melena, Change in Bowel Habits,  Loss of Appetite  GU: No Dysuria, No Change in Color of Urine, No Urgency or Urinary Frequency, No Flank pain.  Musculoskeletal: No Joint Pain or Swelling, No Decreased Range of Motion, No Back  Pain.  Neurologic: No Syncope, No Seizures, Muscle Weakness, Paresthesia, Vision Disturbance or Loss, No Diplopia, No Vertigo, No Difficulty Walking,  Skin: No Rash or Lesions. Psych: No Change in Mood or Affect, No Depression or Anxiety, No Memory loss, No Confusion, or Hallucinations   Past Medical History  Diagnosis Date  . Hypertension   . GERD (gastroesophageal reflux disease)   . Non-healing surgical wound     ABDOMINE  . Spina bifida with hydrocephalus, lumbar region ([place])     CONGENITAL--  HAS VP SHUNT--  PARALYZIED MID ABDOMINE DOWN  . Arnold-Chiari malformation ([place])   . Neurogenic bladder     INCONTINENT -  . 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 Ileostomy in place [facility])   . Wheelchair bound     CAN TRASFER SELF WITHOUT BOARD  . Urinary incontinence with continuous leakage   . Wears glasses   . Pressure ulcer, buttock   . Borderline diabetes     DIET CONTROLLED  . Heart palpitations     SECONDARY TO VP SHUNT DRAIN  . History of seizure as newborn     NO MEDS SINCE 34 YRS OLD--  NO ISSUES SINCE  . Neurogenic bowel   . At risk for sleep apnea     STOP-BANG = 4   SENT TO PCP 11-01-2013     Past Surgical History  Procedure Laterality Date  . Ventriculoperitoneal shunt  LAST REVISION NOV 2014    DUE TO ABD WOUND--- SHUNT DRAINS TO HEART  . Hip surgery Bilateral AS CHILD  TENDON RELEASE  . Cholecystectomy N/A 07/06/2013    Procedure: LAPAROSCOPIC CHOLECYSTECTOMY WITH INTRAOPERATIVE CHOLANGIOGRAM;  Surgeon: Madilyn Hook, DO;  Location: [facility];  Service: General;  Laterality: N/A;  . Colon resection N/A 07/08/2013    Procedure: EXPLORATORY LAPAROSCOPY, DIAGNOSTIC LAPAROSCOPY, PARTIAL COLECTOMY, ABDOMINAL WASHOUT, ABDOMINAL WOUND VAC;  Surgeon: Madilyn Hook, DO;  Location: [facility];  Service: General;  Laterality: N/A;  . Spinal fixation surgery w/ implant  AS CHILD    [place] [facility]  . Orchiopexy Bilateral AS CHILD    UNDESCENDED  TESTIS  . Umbilical hernia repair  03-30-2007  . Right colectomy/  appendectomy/  ileostomy  NOV 2014  [facility]  . Incision and drainage of wound N/A 11/04/2013    Procedure: IRRIGATION AND DEBRIDEMENT ABDOMINAL WOUND WITH PLACEMENT OF ACELL/VAC;  Surgeon: Theodoro Kos, DO;  Location: [facility];  Service: Plastics;  Laterality: N/A;      Prior to Admission medications   Medication Sig Start Date End Date Taking? Authorizing Provider  acetaminophen (TYLENOL) 650 MG CR tablet Take 1,300 mg by mouth every 8 (eight) hours as needed for pain.   Yes Historical Provider, MD  Cranberry 400 MG CAPS Take 400 mg by mouth daily.   Yes Historical Provider, MD  metoprolol (LOPRESSOR) 50 MG tablet Take 1 tablet (50 mg total) by mouth 2 (two) times daily. 11/03/14  Yes Reyne Dumas, MD  Multiple Vitamin (MULTIVITAMIN) tablet Take 1 tablet by mouth daily.   Yes Historical Provider, MD  ondansetron (ZOFRAN) 4 MG tablet Take 4 mg by mouth every 8 (eight) hours as needed for nausea or vomiting.   Yes Historical Provider, MD  oxyCODONE (OXY IR/ROXICODONE) 5 MG immediate release tablet Take 1 tablet (5 mg total) by mouth every 6 (six) hours as needed. 1 by mouth every 3 hours as needed for MILD pain, take 2 by mouth every 3 hours as needed for MODERATE pain, 3 by mouth every 3 hours as needed for SEVERE pain 10/30/14  Yes Reyne Dumas, MD  potassium chloride 20 MEQ TBCR Take 20 mEq by mouth daily. Patient taking differently: Take 20 mEq by mouth 2 (two) times daily.  10/30/14  Yes Reyne Dumas, MD  Wound Dressings (PROMOGRAN EX) Apply 1 application topically daily as needed (bedsores). Tear off 1 piece of promogran, apply normal saline to promogran,then adhere to skin and cover   Yes Historical Provider, MD  cefTRIAXone 1 g in dextrose 5 % 50 mL Inject 1 g into the vein once. Patient not taking: Reported on 11/24/2014 11/07/14   Reyne Dumas, MD  feeding supplement, ENSURE COMPLETE, (ENSURE COMPLETE) LIQD  Take 237 mLs by mouth 2 (two) times daily between meals. Patient not taking: Reported on 11/24/2014 10/30/14   Reyne Dumas, MD  magnesium oxide (MAG-OX) 400 MG tablet Take 1 tablet (400 mg total) by mouth 2 (two) times daily. Patient not taking: Reported on 11/24/2014 10/30/14   Reyne Dumas, MD     Allergies  Allergen Reactions  . Latex Hives  . Adhesive [Tape] Hives  . Ciprofloxacin Other (See Comments)    IV only-- caused burning in arm   . Vancomycin Itching and Swelling  . Succinylcholine Rash    Due to being native [place], his mother states that he takes longer to "come out of it"    Social History:  reports that he has never smoked. He has never used smokeless tobacco. He reports that he does not drink alcohol or use illicit drugs.  Family History  Problem Relation Age of Onset  . Diabetes Other   . Hypertension Other   . Cancer Other   . Stroke Other        Physical Exam:  GEN:  Pleasant Obese Debilitated  34 y.o. Native American male examined and in no acute distress; cooperative with exam Filed Vitals:   07/22/15 1930 07/22/15 2037 07/22/15 2045 07/22/15 2115  BP: 82/54 102/67 110/62 100/64  Pulse: 97 94 91 89  Temp:      TempSrc:      Resp:      SpO2: 99% 99% 99% 98%   Blood pressure 100/64, pulse 89, temperature 98.6 F (37 C), temperature source Oral, resp. rate 18, SpO2 98 %. PSYCH: He is alert and oriented x4; does not appear anxious does not appear depressed; affect is normal HEENT: Normocephalic and Atraumatic, Mucous membranes pink; PERRLA; EOM intact; Fundi:  Benign;  No scleral icterus, Nares: Patent, Oropharynx: Clear, Fair Dentition,    Neck:  FROM, No Cervical Lymphadenopathy nor Thyromegaly or Carotid Bruit; No JVD; Breasts:: Not examined CHEST WALL: No tenderness CHEST: Normal respiration, clear to auscultation bilaterally HEART: Regular rate and rhythm; no murmurs rubs or gallops BACK: No kyphosis or scoliosis; No CVA tenderness ABDOMEN:  Positive Bowel Sounds, Obese, + Iliostomy Present, Soft Non-Tender, No Rebound or Guarding; No Masses, No Organomegaly Rectal Exam: Not done EXTREMITIES: No Cyanosis, Clubbing, +Edema of BLEs. Genitalia: not examined PULSES: 2+ and symmetric SKIN: Normal hydration no rash or ulceration CNS:  Alert and Oriented x 4, Paraplegic  Vascular: pulses palpable throughout    Labs on Admission:  Basic Metabolic Panel:  Recent Labs Lab 07/22/15 1716  NA 129*  K 5.5*  CL 90*  CO2 22  GLUCOSE 125*  BUN 41*  CREATININE 2.37*  CALCIUM 10.8*   Liver Function Tests: No results for input(s): AST, ALT, ALKPHOS, BILITOT, PROT, ALBUMIN in the last 168 hours. No results for input(s): LIPASE, AMYLASE in the last 168 hours. No results for input(s): AMMONIA in the last 168 hours. CBC:  Recent Labs Lab 07/22/15 1716  WBC 25.8*  HGB 13.9  HCT 43.4  MCV 75.1*  PLT 619*   Cardiac Enzymes: No results for input(s): CKTOTAL, CKMB, CKMBINDEX, TROPONINI in the last 168 hours.  BNP (last 3 results) No results for input(s): BNP in the last 8760 hours.  ProBNP (last 3 results) No results for input(s): PROBNP in the last 8760 hours.  CBG: No results for input(s): GLUCAP in the last 168 hours.  Radiological Exams on Admission: Dg Chest 1 View  07/22/2015  CLINICAL DATA:  Ventriculo pleural shunt EXAM: CHEST  1 VIEW; ABDOMEN - 1 VIEW COMPARISON:  Chest x-ray 07/07/2015 FINDINGS: There appears to be an old disconnected shunt tubing and a new shunt tube which appears intact. The cardiac silhouette, mediastinal and hilar contours are within normal limits and stable. Stable spinal hardware. The left spinal rod is fractured but stable. No pleural effusions or pneumothorax. Abdominal film demonstrates a distended gas-filled stomach. Scattered air in the small bowel. No definite free air. The disconnected shunt tube is in the right upper quadrant area. The new shunt catheter tip could be in the right pleural  space or it may be intravascular in the SVC. IMPRESSION: No acute cardiopulmonary findings. Distended gas filled stomach could be due to gastroparesis or gastric outlet obstruction. Shunt catheters as above. Electronically Signed   By: Marijo Sanes M.D.   On: 07/22/2015 21:11  Dg Abd 1 View  07/22/2015  CLINICAL DATA:  Ventriculo pleural shunt EXAM: CHEST  1 VIEW; ABDOMEN - 1 VIEW COMPARISON:  Chest x-ray 07/07/2015 FINDINGS: There appears to be an old disconnected shunt tubing and a new shunt tube which appears intact. The cardiac silhouette, mediastinal and hilar contours are within normal limits and stable. Stable spinal hardware. The left spinal rod is fractured but stable. No pleural effusions or pneumothorax. Abdominal film demonstrates a distended gas-filled stomach. Scattered air in the small bowel. No definite free air. The disconnected shunt tube is in the right upper quadrant area. The new shunt catheter tip could be in the right pleural space or it may be intravascular in the SVC. IMPRESSION: No acute cardiopulmonary findings. Distended gas filled stomach could be due to gastroparesis or gastric outlet obstruction. Shunt catheters as above. Electronically Signed   By: Marijo Sanes M.D.   On: 07/22/2015 21:11   Ct Head Wo Contrast  07/22/2015  CLINICAL DATA:  The patient's mother said her son just had a shunt placed at [facility] on July 10, 2015. The patient went home, but was treated for a UTI there prior to leaving. He had a follow up and had his staples removed and still did not have any problems. The patient's mother said he woke up with a headache and was not acting right. She does not know if he is having problems with his shunt or if it is a UTI again EXAM: CT HEAD WITHOUT CONTRAST TECHNIQUE: Contiguous axial images were obtained from the base of the skull through the vertex without intravenous contrast. COMPARISON:  07/07/2015 FINDINGS: Right parietal shocked extends across midline to  the left anterior lateral ventricle. This is stable from prior exam. The ventricles are normal in overall size. Hydronephrosis seen on the prior exam it is no longer present. There are no parenchymal masses or mass effect. 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 There is no evidence of a cortical infarct. There are no extra-axial masses or abnormal fluid collections. There is no intracranial hemorrhage. Visualized sinuses and mastoid air cells are clear. IMPRESSION: 1. No acute intracranial abnormalities. 2. Ventricles are normal in size. No hydrocephalus. This supports a normal functioning shunt. Hydrocephalus seen on the recent prior study has resolved. Electronically Signed   By: Lajean Manes M.D.   On: 07/22/2015 20:52     EKG: Independently reviewed.         Assessment/Plan:      34 y.o. male with  Principal Problem:   1.     Severe sepsis (HCC)/Severe sepsis with septic shock (HCC)   Blood and urine Cultures sent   Sepsis Workup   IV Linezolid x 1 dose then ID consultation in AM   IV Zosyn   IVFs, and Monitor BPs    Active Problems:     2.    H/O ventricular shunt- due to hydrocephalus- with recent replacement   Reason for [employer] GPC coverage   May need  Neuro Surgery Consult     3.    AKI (acute kidney injury) ([place])   IVFs   Monitor BUN/Cr     4.    Hyperkalemia   Kayexalate 15 grams PO x1     5.    Hyponatremia   IVFs with NSS     6.    Thrombocytosis ([place])- reactive due to #1   Monitor PLTs  7.    Leukocytosis- due to Sepsis   Monitor Trned     8.    Borderline diabetes   SSI coverage   Check HbA1C     9.    Spina Bifida/ with Paraplegia   congenital   10.    DVT Prophylaxis   Lovenox    Code Status:     FULL CODE     Family Communication:   Mother at Bedside     Disposition Plan:    Inpatient Status        Time spent:  3 Minutes      Theressa Millard [facility] Pager [phone]   If [place] Please Contact the Day Rounding Team MD for [facility]  If 7PM-7AM, Please Contact Night-Floor Coverage  [email or web address] Password [email or web address] 07/22/2015, 10:15 PM     ADDENDUM:   Patient was seen and examined on 07/22/2015

## 2015-07-22 NOTE — ED Provider Notes (Signed)
CSN: [id number]     Arrival date & time 07/22/15  1629 History   First MD Initiated Contact with Patient 07/22/15 1749     Chief Complaint  Patient presents with  . Recurrent UTI    The patient's mother said her son just had a shunt placed at [facility] on July 10, 2015.  The patient went home, but was treated for a UTI there prior to leaving.  He had a follow up and had his staples removed and still did not have any problems.     (Consider location/radiation/quality/duration/timing/severity/associated sxs/prior Treatment) Patient is a 34 y.o. male presenting with headaches. The history is provided by a parent.  Headache Pain location:  Generalized Quality:  Dull Radiates to:  Does not radiate Onset quality:  Gradual Duration:  1 day Timing:  Intermittent Progression:  Resolved Chronicity:  Recurrent Similar to prior headaches: yes   Relieved by:  Nothing Worsened by:  Nothing Ineffective treatments:  None tried Associated symptoms: weakness (generalized with malaise)   Associated symptoms: no abdominal pain and no fever     Past Medical History  Diagnosis Date  . Hypertension   . GERD (gastroesophageal reflux disease)   . Non-healing surgical wound     ABDOMINE  . 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 Arnold-Chiari malformation ([place])   . Neurogenic bladder     INCONTINENT -  . 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 Ileostomy in place [facility])   . Wheelchair bound     CAN TRASFER SELF WITHOUT BOARD  . Urinary incontinence with continuous leakage   . Wears glasses   . Pressure ulcer, buttock   . Borderline diabetes     DIET CONTROLLED  . Heart palpitations     SECONDARY TO VP SHUNT DRAIN  . History of seizure as newborn     NO MEDS SINCE 34 YRS OLD--  NO ISSUES SINCE  . Neurogenic bowel   . At risk for sleep apnea     STOP-BANG = 4   SENT TO PCP  11-01-2013   Past Surgical History  Procedure Laterality Date  . Ventriculoperitoneal shunt  LAST REVISION NOV 2014    DUE TO ABD WOUND--- SHUNT DRAINS TO HEART  . Hip surgery Bilateral AS CHILD    TENDON RELEASE  . Cholecystectomy N/A 07/06/2013    Procedure: LAPAROSCOPIC CHOLECYSTECTOMY WITH INTRAOPERATIVE CHOLANGIOGRAM;  Surgeon: Madilyn Hook, DO;  Location: [facility];  Service: General;  Laterality: N/A;  . Colon resection N/A 07/08/2013    Procedure: EXPLORATORY LAPAROSCOPY, DIAGNOSTIC LAPAROSCOPY, PARTIAL COLECTOMY, ABDOMINAL WASHOUT, ABDOMINAL WOUND VAC;  Surgeon: Madilyn Hook, DO;  Location: [facility];  Service: General;  Laterality: N/A;  . Spinal fixation surgery w/ implant  AS CHILD    [place] R[facility]S  . 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 Umbilical hernia repair  03-30-2007  . Right colectomy/  appendectomy/  ileostomy  NOV 2014  [facility]  . Incision and drainage of wound N/A 11/04/2013    Procedure: IRRIGATION AND DEBRIDEMENT ABDOMINAL WOUND WITH PLACEMENT OF ACELL/VAC;  Surgeon: Theodoro Kos, DO;  Location: [place];  Service: Plastics;  Laterality: N/A;   Family History  Problem Relation Age of Onset  . Diabetes Other   . Hypertension Other   . Cancer Other   . Stroke Other  Social History  Substance Use Topics  . Smoking status: Never Smoker   . Smokeless tobacco: Never Used  . Alcohol Use: No    Review of Systems  Constitutional: Negative for fever.  Gastrointestinal: Negative for abdominal pain.  Neurological: Positive for weakness (generalized with malaise) and headaches.  All other systems reviewed and are negative.     Allergies  Latex; Adhesive; Ciprofloxacin; Vancomycin; and Succinylcholine  Home Medications   Prior to Admission medications   Medication Sig Start Date End Date Taking? Authorizing Provider  acetaminophen (TYLENOL) 650 MG CR tablet Take 1,300 mg by mouth every 8 (eight) hours as needed for pain.    Yes Historical Provider, MD  Cranberry 400 MG CAPS Take 400 mg by mouth daily.   Yes Historical Provider, MD  metoprolol (LOPRESSOR) 50 MG tablet Take 1 tablet (50 mg total) by mouth 2 (two) times daily. 11/03/14  Yes Reyne Dumas, MD  Multiple Vitamin (MULTIVITAMIN) tablet Take 1 tablet by mouth daily.   Yes Historical Provider, MD  ondansetron (ZOFRAN) 4 MG tablet Take 4 mg by mouth every 8 (eight) hours as needed for nausea or vomiting.   Yes Historical Provider, MD  oxyCODONE (OXY IR/ROXICODONE) 5 MG immediate release tablet Take 1 tablet (5 mg total) by mouth every 6 (six) hours as needed. 1 by mouth every 3 hours as needed for MILD pain, take 2 by mouth every 3 hours as needed for MODERATE pain, 3 by mouth every 3 hours as needed for SEVERE pain 10/30/14  Yes Reyne Dumas, MD  potassium chloride 20 MEQ TBCR Take 20 mEq by mouth daily. Patient taking differently: Take 20 mEq by mouth 2 (two) times daily.  10/30/14  Yes Reyne Dumas, MD  Wound Dressings (PROMOGRAN EX) Apply 1 application topically daily as needed (bedsores). Tear off 1 piece of promogran, apply normal saline to promogran,then adhere to skin and cover   Yes Historical Provider, MD  cefTRIAXone 1 g in dextrose 5 % 50 mL Inject 1 g into the vein once. Patient not taking: Reported on 11/24/2014 11/07/14   Reyne Dumas, MD  feeding supplement, ENSURE COMPLETE, (ENSURE COMPLETE) LIQD Take 237 mLs by mouth 2 (two) times daily between meals. Patient not taking: Reported on 11/24/2014 10/30/14   Reyne Dumas, MD  magnesium oxide (MAG-OX) 400 MG tablet Take 1 tablet (400 mg total) by mouth 2 (two) times daily. Patient not taking: Reported on 11/24/2014 10/30/14   Reyne Dumas, MD   BP 102/67 mmHg  Pulse 94  Temp(Src) 98.6 F (37 C) (Oral)  Resp 18  SpO2 99% Physical Exam  Constitutional: He appears listless. No distress.  HENT:  Well healing surgical incision of scalp  Eyes: Conjunctivae are normal.  Neck: Neck supple. No tracheal  deviation present.  Cardiovascular: Regular rhythm and normal heart sounds.  Tachycardia present.   Pulmonary/Chest: Effort normal and breath sounds normal. No respiratory distress.  Abdominal: Soft. He exhibits no distension. There is no tenderness.  Neurological: He appears listless. He is not disoriented. No cranial nerve deficit.  Skin: Skin is warm and dry.  Psychiatric: He has a normal mood and affect. He is slowed.    ED Course  Procedures (including critical care time)  CRITICAL CARE Performed by: Leo Grosser Total critical care time: 30 minutes Critical care time was exclusive of separately billable procedures and treating other patients. Critical care was necessary to treat or prevent imminent or life-threatening deterioration. Critical care was time spent personally by me on  the following activities: development of treatment plan with patient and/or surrogate as well as nursing, discussions with consultants, evaluation of patient's response to treatment, examination of patient, obtaining history from patient or surrogate, ordering and performing treatments and interventions, ordering and review of laboratory studies, ordering and review of radiographic studies, pulse oximetry and re-evaluation of patient's condition.  Labs Review Labs Reviewed  URINALYSIS, ROUTINE W REFLEX MICROSCOPIC (NOT AT [facility]) - Abnormal; Notable for the following:    Color, Urine AMBER (*)    APPearance TURBID (*)    Hgb urine dipstick MODERATE (*)    Bilirubin Urine SMALL (*)    Ketones, ur 15 (*)    Protein, ur 100 (*)    Leukocytes, UA LARGE (*)    All other components within normal limits  CBC - Abnormal; Notable for the following:    WBC 25.8 (*)    MCV 75.1 (*)    MCH 24.0 (*)    Platelets 619 (*)    All other components within normal limits  BASIC METABOLIC PANEL - Abnormal; Notable for the following:    Sodium 129 (*)    Potassium 5.5 (*)    Chloride 90 (*)    Glucose, Bld 125 (*)     BUN 41 (*)    Creatinine, Ser 2.37 (*)    Calcium 10.8 (*)    GFR calc non Af Amer 35 (*)    GFR calc Af Amer 40 (*)    Anion gap 17 (*)    All other components within normal limits  URINE MICROSCOPIC-ADD ON - Abnormal; Notable for the following:    Squamous Epithelial / LPF MANY (*)    Bacteria, UA MANY (*)    All other components within normal limits  I-STAT CG4 LACTIC ACID, ED - Abnormal; Notable for the following:    Lactic Acid, Venous 2.65 (*)    All other components within normal limits  URINE CULTURE  CULTURE, BLOOD (ROUTINE X 2)  CULTURE, BLOOD (ROUTINE X 2)  URINALYSIS, ROUTINE W REFLEX MICROSCOPIC (NOT AT [facility])    Imaging Review Dg Chest 1 View  07/22/2015  CLINICAL DATA:  Ventriculo pleural shunt EXAM: CHEST  1 VIEW; ABDOMEN - 1 VIEW COMPARISON:  Chest x-ray 07/07/2015 FINDINGS: There appears to be an old disconnected shunt tubing and a new shunt tube which appears intact. The cardiac silhouette, mediastinal and hilar contours are within normal limits and stable. Stable spinal hardware. The left spinal rod is fractured but stable. No pleural effusions or pneumothorax. Abdominal film demonstrates a distended gas-filled stomach. Scattered air in the small bowel. No definite free air. The disconnected shunt tube is in the right upper quadrant area. The new shunt catheter tip could be in the right pleural space or it may be intravascular in the SVC. IMPRESSION: No acute cardiopulmonary findings. Distended gas filled stomach could be due to gastroparesis or gastric outlet obstruction. Shunt catheters as above. Electronically Signed   By: Marijo Sanes M.D.   On: 07/22/2015 21:11   Dg Abd 1 View  07/22/2015  CLINICAL DATA:  Ventriculo pleural shunt EXAM: CHEST  1 VIEW; ABDOMEN - 1 VIEW COMPARISON:  Chest x-ray 07/07/2015 FINDINGS: There appears to be an old disconnected shunt tubing and a new shunt tube which appears intact. The cardiac silhouette, mediastinal and hilar contours are  within normal limits and stable. Stable spinal hardware. The left spinal rod is fractured but stable. No pleural effusions or pneumothorax. Abdominal film demonstrates a distended  gas-filled stomach. Scattered air in the small bowel. No definite free air. The disconnected shunt tube is in the right upper quadrant area. The new shunt catheter tip could be in the right pleural space or it may be intravascular in the SVC. IMPRESSION: No acute cardiopulmonary findings. Distended gas filled stomach could be due to gastroparesis or gastric outlet obstruction. Shunt catheters as above. Electronically Signed   By: Marijo Sanes M.D.   On: 07/22/2015 21:11   Ct Head Wo Contrast  07/22/2015  CLINICAL DATA:  The patient's mother said her son just had a shunt placed at [facility] on July 10, 2015. The patient went home, but was treated for a UTI there prior to leaving. He had a follow up and had his staples removed and still did not have any problems. The patient's mother said he woke up with a headache and was not acting right. She does not know if he is having problems with his shunt or if it is a UTI again EXAM: CT HEAD WITHOUT CONTRAST TECHNIQUE: Contiguous axial images were obtained from the base of the skull through the vertex without intravenous contrast. COMPARISON:  07/07/2015 FINDINGS: Right parietal shocked extends across midline to the left anterior lateral ventricle. This is stable from prior exam. The ventricles are normal in overall size. Hydronephrosis seen on the prior exam it is no longer present. There are no parenchymal masses or mass effect. 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 There is no evidence of a cortical infarct. There are no extra-axial masses or abnormal fluid collections. There is no intracranial hemorrhage. Visualized sinuses and mastoid air cells are clear. IMPRESSION: 1. No acute intracranial  abnormalities. 2. Ventricles are normal in size. No hydrocephalus. This supports a normal functioning shunt. Hydrocephalus seen on the recent prior study has resolved. Electronically Signed   By: Lajean Manes M.D.   On: 07/22/2015 20:52   I have personally reviewed and evaluated these images and lab results as part of my medical decision-making.   EKG Interpretation None      MDM   Final diagnoses:  Severe sepsis (HCC)  Leukocytosis  Urinary tract infection without hematuria, site unspecified  AKI (acute kidney injury) ([place])   34 year old male with complicated prior medical history status post recent VP shunt revision and recurrent urinary tract infections presents with mild headache, malaise, intermittent altered mental status. UA is consistent with urinary tract infection with heavy pyuria and many bacteria on catheterized sample with heavy sediment noted following catheterization. Had culture performed on the 25th prior to transfer to [facility] for his revision which showed Rocephin and Zosyn sensitive Escherichia coli urinary tract infection. He received antibiotics during his perioperative course but was not continued on them after discharge. Shunt without discontinuity, no hydrocephalus or evidence of obstruction. High white count with evidence of end organ damage/acute kidney injury, patient also not drinking or eating well since onset of illness and this is likely contributing. Fluid resuscitated for severe sepsis with multiple IV fluid boluses, Zosyn given for broadened coverage for hospital-acquired illnesses, unable to take vancomycin due to swelling and itching allergy listed, discussed with pharmacist who agreed with Zosyn as monotherapy with recent culture data. Hospitalist was consulted for admission and will see the patient in the emergency department.       Leo Grosser, MD 07/22/15 2250

## 2015-07-22 NOTE — ED Notes (Signed)
Tried doing an in and out cath on the pt (with the assistance of Jaclyn - RN),but unable to obtain any urine. Informed Dr. Laneta Simmers and Rubin Payor - RN.

## 2015-07-23 DIAGNOSIS — R6520 Severe sepsis without septic shock: Secondary | ICD-10-CM

## 2015-07-23 DIAGNOSIS — A419 Sepsis, unspecified organism: Principal | ICD-10-CM

## 2015-07-23 LAB — MRSA PCR SCREENING: MRSA by PCR: NEGATIVE

## 2015-07-23 LAB — URINALYSIS, ROUTINE W REFLEX MICROSCOPIC
GLUCOSE, UA: NEGATIVE mg/dL
Ketones, ur: 15 mg/dL — AB
Nitrite: NEGATIVE
PROTEIN: 100 mg/dL — AB
Specific Gravity, Urine: 1.036 — ABNORMAL HIGH (ref 1.005–1.030)
UROBILINOGEN UA: 0.2 mg/dL (ref 0.0–1.0)
pH: 5 (ref 5.0–8.0)

## 2015-07-23 LAB — CBC
HEMATOCRIT: 38.9 % — AB (ref 39.0–52.0)
HEMOGLOBIN: 12.4 g/dL — AB (ref 13.0–17.0)
MCH: 24 pg — AB (ref 26.0–34.0)
MCHC: 31.9 g/dL (ref 30.0–36.0)
MCV: 75.4 fL — AB (ref 78.0–100.0)
Platelets: 452 10*3/uL — ABNORMAL HIGH (ref 150–400)
RBC: 5.16 MIL/uL (ref 4.22–5.81)
RDW: 14.8 % (ref 11.5–15.5)
WBC: 26.1 10*3/uL — AB (ref 4.0–10.5)

## 2015-07-23 LAB — URINE MICROSCOPIC-ADD ON

## 2015-07-23 LAB — BASIC METABOLIC PANEL
ANION GAP: 17 — AB (ref 5–15)
BUN: 45 mg/dL — ABNORMAL HIGH (ref 6–20)
CHLORIDE: 95 mmol/L — AB (ref 101–111)
CO2: 18 mmol/L — AB (ref 22–32)
CREATININE: 2.42 mg/dL — AB (ref 0.61–1.24)
Calcium: 9.4 mg/dL (ref 8.9–10.3)
GFR calc non Af Amer: 34 mL/min — ABNORMAL LOW (ref >60)
GFR, EST AFRICAN AMERICAN: 39 mL/min — AB (ref >60)
Glucose, Bld: 121 mg/dL — ABNORMAL HIGH (ref 65–99)
POTASSIUM: 4.2 mmol/L (ref 3.5–5.1)
SODIUM: 130 mmol/L — AB (ref 135–145)

## 2015-07-23 LAB — PROTIME-INR
INR: 1.2 (ref 0.00–1.49)
PROTHROMBIN TIME: 15.3 s — AB (ref 11.6–15.2)

## 2015-07-23 LAB — LACTIC ACID, PLASMA
Lactic Acid, Venous: 2.8 mmol/L (ref 0.5–2.0)
Lactic Acid, Venous: 2.2 mmol/L (ref 0.5–2.0)

## 2015-07-23 LAB — APTT: APTT: 34 s (ref 24–37)

## 2015-07-23 LAB — PROCALCITONIN: PROCALCITONIN: 0.86 ng/mL

## 2015-07-23 MED ORDER — SODIUM CHLORIDE 0.9 % IV BOLUS (SEPSIS)
500.0000 mL | Freq: Once | INTRAVENOUS | Status: AC
Start: 2015-07-23 — End: 2015-07-23
  Administered 2015-07-23: 500 mL via INTRAVENOUS

## 2015-07-23 NOTE — Care Management Note (Signed)
Case Management Note  Patient Details  Name: [name] MRN: [id number] Date of Birth: 02/20/1981  Subjective/Objective:       Adm w sepsis             Action/Plan: lives w fam, act w [facility] for hhrn   Expected Discharge Date:                  Expected Discharge Plan:  [name]  In-House Referral:     Discharge planning Services     Post Acute Care Choice:  Resumption of Svcs/PTA Provider Choice offered to:     DME Arranged:    DME Agency:     HH Arranged:  RN [place] Agency:  [place]  Status of Service:     Medicare Important Message Given:    Date Medicare IM Given:    Medicare IM give by:    Date Additional Medicare IM Given:    Additional Medicare Important Message give by:     If discussed at [place] of Stay Meetings, dates discussed:    Additional Comments: mary w [facility] aware pt in hospital and will follow along  Lacretia Leigh, RN 07/23/2015, 11:51 AM

## 2015-07-23 NOTE — Progress Notes (Signed)
CRITICAL VALUE ALERT  Critical value received: Lactic Acid 2.8  Date of notification: 07/23/15  Time of notification: 0104  Critical value read back: yes  Nurse who received alert: Leary Roca, RN  MD notified (1st page) K. Schorr, NP  Time of first page:  0105  Responding MD: K.Schorr, NP  Time MD responded: [phone]

## 2015-07-23 NOTE — Progress Notes (Signed)
Orthopedic Tech Progress Note Patient Details:  [name] Jan 09, 1981 [id number]  Patient ID: [name], male   DOB: 04-11-1981, 34 y.o.   MRN: [id number]   [name] 07/23/2015, 12:06 PMOut of overhead frames.

## 2015-07-23 NOTE — Progress Notes (Addendum)
Patient Demographics  [name], is a 34 y.o. male, DOB - 11/26/1980, [id number]  Admit date - 07/22/2015   Admitting Physician Theressa Millard, MD  Outpatient Primary MD for the patient is Elyn Peers, MD  LOS - 1   Chief Complaint  Patient presents with  . Recurrent UTI    The patient's mother said her son just had a shunt placed at [facility] on July 10, 2015.  The patient went home, but was treated for a UTI there prior to leaving.  He had a follow up and had his staples removed and still did not have any problems.       Admission HPI/Brief narrative: 34 y.o. 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  Subjective:   [name] today has, No headache, No chest pain, No abdominal pain - No Nausea,  No Cough - SOB.   Assessment & Plan    Principal Problem:   Severe sepsis ([place]) Active Problems:   Severe sepsis with septic shock (HCC)   H/O ventricular shunt   AKI (acute kidney injury) ([place])   Hyperkalemia   Hyponatremia   Thrombocytosis (HCC)   Leukocytosis   Borderline diabetes   Arnold-Chiari malformation (HCC)  Sepsis - Patient presents with severe sepsis including elevated lactic acid, leukocytosis and hypotension. - This is most likely related to UTI, will continue with IV Zosyn pending urine culture results. - Appears to be improving, blood pressure is more stable, but remains tachycardic, lactic acid trending down. - Continue with IV  fluids.  Acute renal failure - due to volume depletion. - Avoid nephrotoxic medication, continue with IV fluids. - No evidence of urinary retention on straight cath  Recent history of VP shunt replacement - Was done at [facility], repeat CT head with no evidence of hydrocephalus, shows normal size, patient denies any headache, blurry vision or vomiting.  Hypokalemia - Repleted  Hyponatremia - Due to volume depletion, improving.  Borderline diabetes - Monitor CBGs with is is I coverage during hospital stay  Spina bifida with paraplegia - Congenital, continue supportive care   Code Status: Full  Family Communication: Mother at bedside  Disposition Plan: Home when stable   Procedures  None   Consults   None   Medications  Scheduled Meds: . enoxaparin (LOVENOX) injection  40 mg Subcutaneous QHS  . multivitamin with minerals  1 tablet Oral Daily  . piperacillin-tazobactam (ZOSYN)  IV  3.375 g Intravenous Q8H   Continuous Infusions: . sodium chloride 100 mL/hr at 07/23/15 0415   PRN Meds:.acetaminophen **OR** acetaminophen, alum & mag hydroxide-simeth, HYDROmorphone (DILAUDID) injection, ondansetron **OR** ondansetron (ZOFRAN) IV, oxyCODONE  DVT Prophylaxis  Lovenox -  Lab Results  Component Value Date   PLT 452* 07/23/2015    Antibiotics    Anti-infectives    Start     Dose/Rate Route Frequency Ordered Stop   07/23/15 0600  piperacillin-tazobactam (ZOSYN) IVPB 3.375 g  Status:  Discontinued     3.375 g 100 mL/hr over 30 Minutes Intravenous 3 times per day 07/22/15 2332 07/22/15 2337   07/23/15 0600  piperacillin-tazobactam (ZOSYN) IVPB 3.375 g     3.375 g 12.5 mL/hr over 240 Minutes Intravenous Every 8 hours 07/22/15 2338     07/22/15 2245  linezolid (ZYVOX) IVPB 600 mg     600 mg 300 mL/hr over 60 Minutes Intravenous  Once 07/22/15 2236 07/23/15 0145   07/22/15 2145  piperacillin-tazobactam (ZOSYN) IVPB 3.375 g     3.375 g 100 mL/hr over 30 Minutes  Intravenous  Once 07/22/15 2134 07/22/15 2322   07/22/15 2045  cefTRIAXone (ROCEPHIN) 1 g in dextrose 5 % 50 mL IVPB  Status:  Discontinued     1 g 100 mL/hr over 30 Minutes Intravenous  Once 07/22/15 2043 07/22/15 2140          Objective:   Filed Vitals:   07/23/15 0600 07/23/15 0700 07/23/15 0800 07/23/15 0900  BP: 97/57 98/58 99/66  93/57  Pulse: 109 107 105 111  Temp:    97.8 F (36.6 C)  TempSrc:    Oral  Resp: 20 19 17 22   Height:      Weight:      SpO2: 100% 98% 100% 100%    Wt Readings from Last 3 Encounters:  07/22/15 67.1 kg (147 lb 14.9 oz)  07/07/15 72.576 kg (160 lb)  11/24/14 75.297 kg (166 lb)     Intake/Output Summary (Last 24 hours) at 07/23/15 1059 Last data filed at 07/23/15 0900  Gross per 24 hour  Intake   1970 ml  Output   1800 ml  Net    170 ml     Physical Exam  Awake Alert, Oriented X 3,  Normal affect Supple Neck,No JVD, Symmetrical Chest wall movement, Good air movement bilaterally, CTAB RRR,No Gallops,Rubs or new Murmurs, No Parasternal Heave +ve B.Sounds, Abd Soft, No tenderness,  No rebound - guarding or rigidity, ileostomy plus with good output. No Cyanosis, Clubbing or edema, patient with spina bifida.   Data Review   Micro Results Recent Results (from the past 240 hour(s))  Urine culture     Status: None (Preliminary result)   Collection Time: 07/22/15  6:46 PM  Result Value Ref Range Status   Specimen Description URINE, RANDOM  Final   Special Requests NONE  Final   Culture TOO YOUNG TO READ  Final   Report Status PENDING  Incomplete  MRSA PCR Screening     Status: None   Collection Time: 07/22/15 11:44 PM  Result Value Ref Range Status   MRSA by PCR NEGATIVE NEGATIVE Final    Comment:        The GeneXpert MRSA Assay (FDA approved for NASAL specimens only), is one component of a comprehensive MRSA colonization surveillance program. It is not intended to diagnose MRSA infection nor to guide or monitor treatment  for MRSA infections.     Radiology Reports Dg Skull 1-3 Views  07/07/2015  CLINICAL DATA:  Headache for 3 days, shunt malfunction EXAM: SKULL - 1-3 VIEW COMPARISON:  10/25/2014 FINDINGS: There is no evidence of skull fracture or other focal bone lesions. Again noted 2 shunt catheters in right scalp and right soft tissue neck. Again noted 1 of the catheters blind ending. Stable a catheter fragment in right soft tissue neck. IMPRESSION: Again noted 2 shunt catheters in right scalp and right soft tissue neck. Again  noted 1 of the catheters blind ending. Stable a catheter fragment in right soft tissue neck. Electronically Signed   By: Lahoma Crocker M.D.   On: 07/07/2015 12:15   Dg Chest 1 View  07/22/2015  CLINICAL DATA:  Ventriculo pleural shunt EXAM: CHEST  1 VIEW; ABDOMEN - 1 VIEW COMPARISON:  Chest x-ray 07/07/2015 FINDINGS: There appears to be an old disconnected shunt tubing and a new shunt tube which appears intact. The cardiac silhouette, mediastinal and hilar contours are within normal limits and stable. Stable spinal hardware. The left spinal rod is fractured but stable. No pleural effusions or pneumothorax. Abdominal film demonstrates a distended gas-filled stomach. Scattered air in the small bowel. No definite free air. The disconnected shunt tube is in the right upper quadrant area. The new shunt catheter tip could be in the right pleural space or it may be intravascular in the SVC. IMPRESSION: No acute cardiopulmonary findings. Distended gas filled stomach could be due to gastroparesis or gastric outlet obstruction. Shunt catheters as above. Electronically Signed   By: Marijo Sanes M.D.   On: 07/22/2015 21:11   Dg Chest 1 View  07/07/2015  CLINICAL DATA:  Hydrocephalus with ventriculoperitoneal shunt. Headache for 3 days. EXAM: CHEST 1 VIEW COMPARISON:  11/06/2014. FINDINGS: Trachea is midline. Thoracic anatomy is distorted by scoliosis. Lungs are grossly clear. IMPRESSION: Lungs are grossly  clear. Electronically Signed   By: Lorin Picket M.D.   On: 07/07/2015 12:14   Dg Abd 1 View  07/22/2015  CLINICAL DATA:  Ventriculo pleural shunt EXAM: CHEST  1 VIEW; ABDOMEN - 1 VIEW COMPARISON:  Chest x-ray 07/07/2015 FINDINGS: There appears to be an old disconnected shunt tubing and a new shunt tube which appears intact. The cardiac silhouette, mediastinal and hilar contours are within normal limits and stable. Stable spinal hardware. The left spinal rod is fractured but stable. No pleural effusions or pneumothorax. Abdominal film demonstrates a distended gas-filled stomach. Scattered air in the small bowel. No definite free air. The disconnected shunt tube is in the right upper quadrant area. The new shunt catheter tip could be in the right pleural space or it may be intravascular in the SVC. IMPRESSION: No acute cardiopulmonary findings. Distended gas filled stomach could be due to gastroparesis or gastric outlet obstruction. Shunt catheters as above. Electronically Signed   By: Marijo Sanes M.D.   On: 07/22/2015 21:11   Ct Head Wo Contrast  07/22/2015  CLINICAL DATA:  The patient's mother said her son just had a shunt placed at [facility] on July 10, 2015. The patient went home, but was treated for a UTI there prior to leaving. He had a follow up and had his staples removed and still did not have any problems. The patient's mother said he woke up with a headache and was not acting right. She does not know if he is having problems with his shunt or if it is a UTI again EXAM: CT HEAD WITHOUT CONTRAST TECHNIQUE: Contiguous axial images were obtained from the base of the skull through the vertex without intravenous contrast. COMPARISON:  07/07/2015 FINDINGS: Right parietal shocked extends across midline to the left anterior lateral ventricle. This is stable from prior exam. The ventricles are normal in overall size. Hydronephrosis seen on the prior exam it is no longer present. There are no parenchymal  masses or mass effect. 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 There is no evidence  of a cortical infarct. There are no extra-axial masses or abnormal fluid collections. There is no intracranial hemorrhage. Visualized sinuses and mastoid air cells are clear. IMPRESSION: 1. No acute intracranial abnormalities. 2. Ventricles are normal in size. No hydrocephalus. This supports a normal functioning shunt. Hydrocephalus seen on the recent prior study has resolved. Electronically Signed   By: Lajean Manes M.D.   On: 07/22/2015 20:52   Ct Head Wo Contrast  07/07/2015  CLINICAL DATA:  Patient with headache for 3 days. 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 VP shunt. EXAM: CT HEAD WITHOUT CONTRAST TECHNIQUE: Contiguous axial images were obtained from the base of the skull through the vertex without intravenous contrast. COMPARISON:  Brain CT 11/06/2014 FINDINGS: Right posterior parietal approach VP shunt catheter is present with tip terminating in the anterior aspect of the left lateral ventricle. The ventriculostomy catheter is disconnected from the reservoir along the lateral aspect of the skull (image 18; series 2). Re- demonstrated diffuse ventriculomegaly of the lateral and third ventricles. There is diffuse periventricular hypodensity involving the lateral ventricles, most compatible with transependymal flow of CSF. No significant mass effect or midline shift. Agenesis of the corpus callosum. Unchanged calcification along the posterior aspect of the falx. No extra-axial fluid collections. No intracranial hemorrhage, mass lesion or evidence for acute cortically based infarct. The cerebellar tonsils are present within the foramen magnum, unchanged. IMPRESSION: The ventriculostomy catheter is disconnected from the reservoir at the junction along the lateral skull. Persistent dilatation of the lateral and  third ventricles. There is periventricular white matter hypodensity about the lateral ventricles most suggestive of transependymal flow of CSF. These results were called by telephone at the time of interpretation on 07/07/2015 at 12:56 pm to Dr. Brantley Stage , who verbally acknowledged these results. Electronically Signed   By: Lovey Newcomer M.D.   On: 07/07/2015 13:52   Dg Chest Port 1 View  07/07/2015  CLINICAL DATA:  Patient status post ET tube placement. EXAM: PORTABLE CHEST 1 VIEW COMPARISON:  Chest radiograph 07/07/2015 FINDINGS: Interval insertion ET tube terminating in the mid trachea. Stable cardiac and mediastinal contours. No consolidative pulmonary opacities. No pleural effusion or pneumothorax. IMPRESSION: ET tube terminates in the mid trachea. Electronically Signed   By: Lovey Newcomer M.D.   On: 07/07/2015 16:30     CBC  Recent Labs Lab 07/22/15 1716 07/23/15 0307  WBC 25.8* 26.1*  HGB 13.9 12.4*  HCT 43.4 38.9*  PLT 619* 452*  MCV 75.1* 75.4*  MCH 24.0* 24.0*  MCHC 32.0 31.9  RDW 14.9 14.8    Chemistries   Recent Labs Lab 07/22/15 1716 07/23/15 0307  NA 129* 130*  K 5.5* 4.2  CL 90* 95*  CO2 22 18*  GLUCOSE 125* 121*  BUN 41* 45*  CREATININE 2.37* 2.42*  CALCIUM 10.8* 9.4   ------------------------------------------------------------------------------------------------------------------ estimated creatinine clearance is 35.2 mL/min (by C-G formula based on Cr of 2.42). ------------------------------------------------------------------------------------------------------------------ No results for input(s): HGBA1C in the last 72 hours. ------------------------------------------------------------------------------------------------------------------ No results for input(s): CHOL, HDL, LDLCALC, TRIG, CHOLHDL, LDLDIRECT in the last 72 hours. ------------------------------------------------------------------------------------------------------------------ No results for  input(s): TSH, T4TOTAL, T3FREE, THYROIDAB in the last 72 hours.  Invalid input(s): FREET3 ------------------------------------------------------------------------------------------------------------------ No results for input(s): VITAMINB12, FOLATE, FERRITIN, TIBC, IRON, RETICCTPCT in the last 72 hours.  Coagulation profile  Recent Labs Lab 07/23/15 0022  INR 1.20    No results for input(s): DDIMER in the last 72 hours.  Cardiac Enzymes No results for input(s): CKMB,  TROPONINI, MYOGLOBIN in the last 168 hours.  Invalid input(s): CK ------------------------------------------------------------------------------------------------------------------ Invalid input(s): POCBNP     Time Spent in minutes   40 Minutes   Kihanna Kamiya M.D on 07/23/2015 at 10:59 AM  Between 7am to 7pm - Pager - [phone]  After 7pm go to [email or web address] - password [facility]  [facility]   Office  [phone]

## 2015-07-23 NOTE — Progress Notes (Signed)
Utilization review completed. Kimbly Eanes, RN, BSN. 

## 2015-07-24 LAB — CBC
HEMATOCRIT: 33 % — AB (ref 39.0–52.0)
HEMOGLOBIN: 10.3 g/dL — AB (ref 13.0–17.0)
MCH: 23.3 pg — ABNORMAL LOW (ref 26.0–34.0)
MCHC: 31.2 g/dL (ref 30.0–36.0)
MCV: 74.5 fL — ABNORMAL LOW (ref 78.0–100.0)
Platelets: 395 10*3/uL (ref 150–400)
RBC: 4.43 MIL/uL (ref 4.22–5.81)
RDW: 14.6 % (ref 11.5–15.5)
WBC: 13.5 10*3/uL — AB (ref 4.0–10.5)

## 2015-07-24 LAB — BASIC METABOLIC PANEL
ANION GAP: 12 (ref 5–15)
BUN: 25 mg/dL — ABNORMAL HIGH (ref 6–20)
CALCIUM: 9 mg/dL (ref 8.9–10.3)
CHLORIDE: 98 mmol/L — AB (ref 101–111)
CO2: 23 mmol/L (ref 22–32)
CREATININE: 0.83 mg/dL (ref 0.61–1.24)
GFR calc non Af Amer: >60 mL/min (ref >60)
Glucose, Bld: 87 mg/dL (ref 65–99)
Potassium: 3.4 mmol/L — ABNORMAL LOW (ref 3.5–5.1)
Sodium: 133 mmol/L — ABNORMAL LOW (ref 135–145)

## 2015-07-24 LAB — URINE CULTURE

## 2015-07-24 MED ORDER — SODIUM CHLORIDE 0.9 % IV SOLN
1.0000 g | Freq: Four times a day (QID) | INTRAVENOUS | Status: DC
  Administered 2015-07-24 – 2015-07-25 (×4): 1 g via INTRAVENOUS
  Filled 2015-07-24 (×6): qty 1000

## 2015-07-24 MED ORDER — POTASSIUM CHLORIDE CRYS ER 20 MEQ PO TBCR
40.0000 meq | EXTENDED_RELEASE_TABLET | Freq: Every day | ORAL | Status: DC
  Administered 2015-07-24 – 2015-07-25 (×2): 40 meq via ORAL
  Filled 2015-07-24 (×2): qty 2

## 2015-07-24 NOTE — Care Management Important Message (Signed)
Important Message  Patient Details  Name: [name] MRN: [id number] Date of Birth: Oct 16, 1980   Medicare Important Message Given:  Yes    Malaisha Silliman P Kirstie Larsen 07/24/2015, 1:05 PM

## 2015-07-24 NOTE — Progress Notes (Signed)
Patient Demographics  [name], is a 34 y.o. male, DOB - [birth year], [id number]  Admit date - 07/22/2015   Admitting Physician Theressa Millard, MD  Outpatient Primary MD for the patient is Elyn Peers, MD  LOS - 2   Chief Complaint  Patient presents with  . Recurrent UTI    The patient's mother said her son just had a shunt placed at [facility] on July 10, 2015.  The patient went home, but was treated for a UTI there prior to leaving.  He had a follow up and had his staples removed and still did not have any problems.       Admission HPI/Brief narrative: 34 y.o. 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  Subjective:   [name] today has, No headache, No chest pain, No abdominal pain - No Nausea,  No Cough - SOB.   Assessment & Plan    Principal Problem:   Severe sepsis ([place]) Active Problems:   Severe sepsis with septic shock (HCC)   H/O ventricular shunt   AKI (acute kidney injury) ([place])   Hyperkalemia   Hyponatremia   Thrombocytosis (HCC)   Leukocytosis   Borderline diabetes   Arnold-Chiari malformation (HCC)  Sepsis/UTI - Patient presents with severe sepsis including elevated lactic acid, leukocytosis and hypotension. - This is most likely related to UTI, treated  with IV Zosyn pending urine culture results. - Appears to be improving, blood pressure is more stable, but remains tachycardic, lactic acid trending down. - Continue with IV fluids. -  Urine culture growing enterococcus, sensitive to ampicillin, will change Zosyn to ampicillin. - Will transfer to telemetry today  Acute renal failure - due to volume depletion. - Avoid nephrotoxic medication, continue with IV fluids. - No evidence of urinary retention on straight cath - Resolved  Recent history of VP shunt replacement - Was done at [facility], repeat CT head with no evidence of hydrocephalus, shows normal size ventricles, patient denies any headache, blurry vision or vomiting.  Hypokalemia - Repleted  Hyponatremia - Due to volume depletion, improving.  Borderline diabetes - Monitor CBGs with is is I coverage during hospital stay  Spina bifida with paraplegia - Congenital, continue supportive care   Code Status: Full  Family Communication: Mother at bedside  Disposition Plan: Home when stable   Procedures  None   Consults   None   Medications  Scheduled Meds: . enoxaparin (LOVENOX) injection  40 mg Subcutaneous QHS  . multivitamin with minerals  1 tablet Oral Daily  . piperacillin-tazobactam (ZOSYN)  IV  3.375 g Intravenous Q8H   Continuous Infusions: . sodium chloride 100 mL/hr at 07/24/15 0341   PRN Meds:.acetaminophen **OR** acetaminophen, alum & mag hydroxide-simeth, HYDROmorphone (DILAUDID) injection, ondansetron **OR** ondansetron (ZOFRAN) IV, oxyCODONE  DVT Prophylaxis  Lovenox -  Lab Results  Component Value Date   PLT 395 07/24/2015    Antibiotics  Anti-infectives    Start     Dose/Rate Route Frequency Ordered Stop   07/23/15 0600  piperacillin-tazobactam (ZOSYN) IVPB 3.375 g  Status:  Discontinued     3.375 g 100 mL/hr over 30 Minutes Intravenous 3 times per day 07/22/15 2332 07/22/15 2337   07/23/15 0600  piperacillin-tazobactam (ZOSYN) IVPB 3.375 g     3.375 g 12.5 mL/hr over 240 Minutes Intravenous Every 8 hours 07/22/15 2338     07/22/15 2245  linezolid (ZYVOX) IVPB 600 mg     600 mg 300 mL/hr over 60 Minutes  Intravenous  Once 07/22/15 2236 07/23/15 0145   07/22/15 2145  piperacillin-tazobactam (ZOSYN) IVPB 3.375 g     3.375 g 100 mL/hr over 30 Minutes Intravenous  Once 07/22/15 2134 07/22/15 2322   07/22/15 2045  cefTRIAXone (ROCEPHIN) 1 g in dextrose 5 % 50 mL IVPB  Status:  Discontinued     1 g 100 mL/hr over 30 Minutes Intravenous  Once 07/22/15 2043 07/22/15 2140          Objective:   Filed Vitals:   07/24/15 0100 07/24/15 0300 07/24/15 0500 07/24/15 0747  BP: 97/65 111/70 101/64 119/74  Pulse: 114 104 102 107  Temp:   98.4 F (36.9 C) 97.8 F (36.6 C)  TempSrc:   Oral Oral  Resp: 19 16 20 20   Height:      Weight:      SpO2: 100% 100% 100% 100%    Wt Readings from Last 3 Encounters:  07/22/15 67.1 kg (147 lb 14.9 oz)  07/07/15 72.576 kg (160 lb)  11/24/14 75.297 kg (166 lb)     Intake/Output Summary (Last 24 hours) at 07/24/15 0949 Last data filed at 07/24/15 0900  Gross per 24 hour  Intake   3670 ml  Output   2480 ml  Net   1190 ml     Physical Exam  Awake Alert, Oriented X 3,  Normal affect Supple Neck,No JVD, Symmetrical Chest wall movement, Good air movement bilaterally, CTAB RRR,No Gallops,Rubs or new Murmurs, No Parasternal Heave +ve B.Sounds, Abd Soft, No tenderness,  No rebound - guarding or rigidity, ileostomy plus with good output. No Cyanosis, Clubbing or edema, patient with spina bifida.   Data Review   Micro Results Recent Results (from the past 240 hour(s))  Urine culture     Status: None   Collection Time: 07/22/15  6:46 PM  Result Value Ref Range Status   Specimen Description URINE, RANDOM  Final   Special Requests NONE  Final   Culture >=100,000 COLONIES/mL ENTEROCOCCUS SPECIES  Final   Report Status 07/24/2015 FINAL  Final   Organism ID, Bacteria ENTEROCOCCUS SPECIES  Final      Susceptibility   Enterococcus species - MIC*    AMPICILLIN <=2 SENSITIVE Sensitive     LEVOFLOXACIN 1 SENSITIVE Sensitive     NITROFURANTOIN <=16  SENSITIVE Sensitive     VANCOMYCIN 1 SENSITIVE Sensitive     * >=100,000 COLONIES/mL ENTEROCOCCUS SPECIES  Blood culture (routine x 2)     Status: None (Preliminary result)   Collection Time: 07/22/15  9:55 PM  Result Value Ref Range Status   Specimen Description BLOOD RIGHT ARM  Final   Special Requests IN PEDIATRIC BOTTLE 4CC  Final   Culture NO GROWTH < 24 HOURS  Final   Report Status PENDING  Incomplete  Blood culture (routine x 2)     Status: None (Preliminary result)   Collection Time: 07/22/15  9:57 PM  Result Value Ref Range Status   Specimen Description BLOOD RIGHT HAND  Final   Special Requests IN PEDIATRIC BOTTLE 4CC  Final   Culture NO GROWTH < 24 HOURS  Final   Report Status PENDING  Incomplete  MRSA PCR Screening     Status: None   Collection Time: 07/22/15 11:44 PM  Result Value Ref Range Status   MRSA by PCR NEGATIVE NEGATIVE Final    Comment:        The GeneXpert MRSA Assay (FDA approved for NASAL specimens only), is one component of a comprehensive MRSA colonization surveillance program. It is not intended to diagnose MRSA infection nor to guide or monitor treatment for MRSA infections.     Radiology Reports Dg Skull 1-3 Views  07/07/2015  CLINICAL DATA:  Headache for 3 days, shunt malfunction EXAM: SKULL - 1-3 VIEW COMPARISON:  10/25/2014 FINDINGS: There is no evidence of skull fracture or other focal bone lesions. Again noted 2 shunt catheters in right scalp and right soft tissue neck. Again noted 1 of the catheters blind ending. Stable a catheter fragment in right soft tissue neck. IMPRESSION: Again noted 2 shunt catheters in right scalp and right soft tissue neck. Again noted 1 of the catheters blind ending. Stable a catheter fragment in right soft tissue neck. Electronically Signed   By: Lahoma Crocker M.D.   On: 07/07/2015 12:15   Dg Chest 1 View  07/22/2015  CLINICAL DATA:  Ventriculo pleural shunt EXAM: CHEST  1 VIEW; ABDOMEN - 1 VIEW COMPARISON:  Chest  x-ray 07/07/2015 FINDINGS: There appears to be an old disconnected shunt tubing and a new shunt tube which appears intact. The cardiac silhouette, mediastinal and hilar contours are within normal limits and stable. Stable spinal hardware. The left spinal rod is fractured but stable. No pleural effusions or pneumothorax. Abdominal film demonstrates a distended gas-filled stomach. Scattered air in the small bowel. No definite free air. The disconnected shunt tube is in the right upper quadrant area. The new shunt catheter tip could be in the right pleural space or it may be intravascular in the SVC. IMPRESSION: No acute cardiopulmonary findings. Distended gas filled stomach could be due to gastroparesis or gastric outlet obstruction. Shunt catheters as above. Electronically Signed   By: Marijo Sanes M.D.   On: 07/22/2015 21:11   Dg Chest 1 View  07/07/2015  CLINICAL DATA:  Hydrocephalus with ventriculoperitoneal shunt. Headache for 3 days. EXAM: CHEST 1 VIEW COMPARISON:  11/06/2014. FINDINGS: Trachea is midline. Thoracic anatomy is distorted by scoliosis. Lungs are grossly clear. IMPRESSION: Lungs are grossly clear. Electronically Signed   By: Lorin Picket M.D.   On: 07/07/2015 12:14   Dg Abd 1 View  07/22/2015  CLINICAL DATA:  Ventriculo pleural shunt EXAM: CHEST  1 VIEW; ABDOMEN - 1 VIEW COMPARISON:  Chest x-ray 07/07/2015 FINDINGS: There appears to be an old disconnected shunt tubing and a new shunt tube which appears intact. The cardiac silhouette, mediastinal and hilar contours are within normal limits and stable. Stable spinal hardware. The left spinal rod is fractured but stable. No pleural effusions or pneumothorax. Abdominal film demonstrates a distended gas-filled stomach. Scattered air in the small bowel. No definite free air. The disconnected shunt tube is in the right upper quadrant area. The new shunt catheter tip could be in the right pleural space or it may be intravascular in the SVC.  IMPRESSION: No acute cardiopulmonary findings. Distended gas filled stomach could be due to gastroparesis or gastric  outlet obstruction. Shunt catheters as above. Electronically Signed   By: Marijo Sanes M.D.   On: 07/22/2015 21:11   Ct Head Wo Contrast  07/22/2015  CLINICAL DATA:  The patient's mother said her son just had a shunt placed at [facility] on July 10, 2015. The patient went home, but was treated for a UTI there prior to leaving. He had a follow up and had his staples removed and still did not have any problems. The patient's mother said he woke up with a headache and was not acting right. She does not know if he is having problems with his shunt or if it is a UTI again EXAM: CT HEAD WITHOUT CONTRAST TECHNIQUE: Contiguous axial images were obtained from the base of the skull through the vertex without intravenous contrast. COMPARISON:  07/07/2015 FINDINGS: Right parietal shocked extends across midline to the left anterior lateral ventricle. This is stable from prior exam. The ventricles are normal in overall size. Hydronephrosis seen on the prior exam it is no longer present. There are no parenchymal masses or mass effect. 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 There is no evidence of a cortical infarct. There are no extra-axial masses or abnormal fluid collections. There is no intracranial hemorrhage. Visualized sinuses and mastoid air cells are clear. IMPRESSION: 1. No acute intracranial abnormalities. 2. Ventricles are normal in size. No hydrocephalus. This supports a normal functioning shunt. Hydrocephalus seen on the recent prior study has resolved. Electronically Signed   By: Lajean Manes M.D.   On: 07/22/2015 20:52   Ct Head Wo Contrast  07/07/2015  CLINICAL DATA:  Patient with headache for 3 days. 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 VP shunt. EXAM: CT HEAD WITHOUT  CONTRAST TECHNIQUE: Contiguous axial images were obtained from the base of the skull through the vertex without intravenous contrast. COMPARISON:  Brain CT 11/06/2014 FINDINGS: Right posterior parietal approach VP shunt catheter is present with tip terminating in the anterior aspect of the left lateral ventricle. The ventriculostomy catheter is disconnected from the reservoir along the lateral aspect of the skull (image 18; series 2). Re- demonstrated diffuse ventriculomegaly of the lateral and third ventricles. There is diffuse periventricular hypodensity involving the lateral ventricles, most compatible with transependymal flow of CSF. No significant mass effect or midline shift. Agenesis of the corpus callosum. Unchanged calcification along the posterior aspect of the falx. No extra-axial fluid collections. No intracranial hemorrhage, mass lesion or evidence for acute cortically based infarct. The cerebellar tonsils are present within the foramen magnum, unchanged. IMPRESSION: The ventriculostomy catheter is disconnected from the reservoir at the junction along the lateral skull. Persistent dilatation of the lateral and third ventricles. There is periventricular white matter hypodensity about the lateral ventricles most suggestive of transependymal flow of CSF. These results were called by telephone at the time of interpretation on 07/07/2015 at 12:56 pm to Dr. Brantley Stage , who verbally acknowledged these results. Electronically Signed   By: Lovey Newcomer M.D.   On: 07/07/2015 13:52   Dg Chest Port 1 View  07/07/2015  CLINICAL DATA:  Patient status post ET tube placement. EXAM: PORTABLE CHEST 1 VIEW COMPARISON:  Chest radiograph 07/07/2015 FINDINGS: Interval insertion ET tube terminating in the mid trachea. Stable cardiac and mediastinal contours. No consolidative pulmonary opacities. No pleural effusion or pneumothorax. IMPRESSION: ET tube terminates in the mid trachea. Electronically Signed   By: Lovey Newcomer  M.D.   On: 07/07/2015 16:30     CBC  Recent Labs Lab 07/22/15 1716 07/23/15 0307 07/24/15 0331  WBC 25.8* 26.1* 13.5*  HGB 13.9 12.4* 10.3*  HCT 43.4 38.9* 33.0*  PLT 619* 452* 395  MCV 75.1* 75.4* 74.5*  MCH 24.0* 24.0* 23.3*  MCHC 32.0 31.9 31.2  RDW 14.9 14.8 14.6    Chemistries   Recent Labs Lab 07/22/15 1716 07/23/15 0307 07/24/15 0331  NA 129* 130* 133*  K 5.5* 4.2 3.4*  CL 90* 95* 98*  CO2 22 18* 23  GLUCOSE 125* 121* 87  BUN 41* 45* 25*  CREATININE 2.37* 2.42* 0.83  CALCIUM 10.8* 9.4 9.0   ------------------------------------------------------------------------------------------------------------------ estimated creatinine clearance is 102.7 mL/min (by C-G formula based on Cr of 0.83). ------------------------------------------------------------------------------------------------------------------ No results for input(s): HGBA1C in the last 72 hours. ------------------------------------------------------------------------------------------------------------------ No results for input(s): CHOL, HDL, LDLCALC, TRIG, CHOLHDL, LDLDIRECT in the last 72 hours. ------------------------------------------------------------------------------------------------------------------ No results for input(s): TSH, T4TOTAL, T3FREE, THYROIDAB in the last 72 hours.  Invalid input(s): FREET3 ------------------------------------------------------------------------------------------------------------------ No results for input(s): VITAMINB12, FOLATE, FERRITIN, TIBC, IRON, RETICCTPCT in the last 72 hours.  Coagulation profile  Recent Labs Lab 07/23/15 0022  INR 1.20    No results for input(s): DDIMER in the last 72 hours.  Cardiac Enzymes No results for input(s): CKMB, TROPONINI, MYOGLOBIN in the last 168 hours.  Invalid input(s): CK ------------------------------------------------------------------------------------------------------------------ Invalid input(s):  POCBNP     Time Spent in minutes   30 Minutes   Elder Davidian M.D on 07/24/2015 at 9:49 AM  Between 7am to 7pm - Pager - [phone]  After 7pm go to [email or web address] - password [facility]  [facility]   Office  [phone]

## 2015-07-25 LAB — BASIC METABOLIC PANEL
ANION GAP: 8 (ref 5–15)
BUN: 7 mg/dL (ref 6–20)
CHLORIDE: 105 mmol/L (ref 101–111)
CO2: 24 mmol/L (ref 22–32)
Calcium: 8.8 mg/dL — ABNORMAL LOW (ref 8.9–10.3)
Creatinine, Ser: 0.47 mg/dL — ABNORMAL LOW (ref 0.61–1.24)
GFR calc Af Amer: >60 mL/min (ref >60)
GLUCOSE: 86 mg/dL (ref 65–99)
POTASSIUM: 3.6 mmol/L (ref 3.5–5.1)
Sodium: 137 mmol/L (ref 135–145)

## 2015-07-25 LAB — CBC
HEMATOCRIT: 28.5 % — AB (ref 39.0–52.0)
Hemoglobin: 8.6 g/dL — ABNORMAL LOW (ref 13.0–17.0)
MCH: 22.8 pg — AB (ref 26.0–34.0)
MCHC: 30.2 g/dL (ref 30.0–36.0)
MCV: 75.6 fL — AB (ref 78.0–100.0)
PLATELETS: 289 10*3/uL (ref 150–400)
RBC: 3.77 MIL/uL — AB (ref 4.22–5.81)
RDW: 14.8 % (ref 11.5–15.5)
WBC: 8.6 10*3/uL (ref 4.0–10.5)

## 2015-07-25 MED ORDER — AMOXICILLIN 875 MG PO TABS: 875.0000 mg | ORAL_TABLET | Freq: Two times a day (BID) | ORAL | Status: AC

## 2015-07-25 NOTE — Progress Notes (Deleted)
[name], is a 34 y.o. male  DOB 09-Oct-1980  MRN [id number].  Admission date:  07/22/2015  Admitting Physician  Theressa Millard, MD  Discharge Date:  07/25/2015   Primary MD  Elyn Peers, MD  Recommendations for primary care physician for things to follow:  - To have basic labs including CBC, BMP during next visit. - Metoprolol has been stopped on discharge, is reevaluated during next visit to resume blood pressure is elevated.   Admission Diagnosis  Leukocytosis [D72.829] AKI (acute kidney injury) ([facility] [place]) [N17.9] Severe sepsis ([place]) [A41.9, R65.20] Urinary tract infection without hematuria, site unspecified [N39.0]   Discharge Diagnosis  Leukocytosis [D72.829] AKI (acute kidney injury) ([place]) [N17.9] Severe sepsis ([place]) [A41.9, R65.20] Urinary tract infection without hematuria, site unspecified [N39.0]    Principal Problem:   Severe sepsis ([place]) Active Problems:   Severe sepsis with septic shock (HCC)   H/O ventricular shunt   AKI (acute kidney injury) ([place])   Hyperkalemia   Hyponatremia   Thrombocytosis (HCC)   Leukocytosis   Borderline diabetes   Arnold-Chiari malformation ([place])      Past Medical History  Diagnosis Date  . Hypertension   . GERD (gastroesophageal reflux disease)   . Non-healing surgical wound     ABDOMINE  . Spina bifida with hydrocephalus, lumbar region ([place])     CONGENITAL--  HAS VP SHUNT--  PARALYZIED MID ABDOMINE DOWN  . Arnold-Chiari malformation ([place])   . Neurogenic bladder     INCONTINENT -  . 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 Ileostomy in place [name])   . Wheelchair bound     CAN TRASFER SELF WITHOUT BOARD  . Urinary incontinence with continuous leakage   . Wears glasses   . Pressure ulcer, buttock   . Borderline diabetes     DIET CONTROLLED  . Heart palpitations     SECONDARY TO VP SHUNT DRAIN  .  History of seizure as newborn     NO MEDS SINCE 34 YRS OLD--  NO ISSUES SINCE  . Neurogenic bowel   . At risk for sleep apnea     STOP-BANG = 4   SENT TO PCP 11-01-2013    Past Surgical History  Procedure Laterality Date  . Ventriculoperitoneal shunt  LAST REVISION NOV 2014    DUE TO ABD WOUND--- SHUNT DRAINS TO HEART  . Hip surgery Bilateral AS CHILD    TENDON RELEASE  . Cholecystectomy N/A 07/06/2013    Procedure: LAPAROSCOPIC CHOLECYSTECTOMY WITH INTRAOPERATIVE CHOLANGIOGRAM;  Surgeon: Madilyn Hook, DO;  Location: [facility];  Service: General;  Laterality: N/A;  . Colon resection N/A 07/08/2013    Procedure: EXPLORATORY LAPAROSCOPY, DIAGNOSTIC LAPAROSCOPY, PARTIAL COLECTOMY, ABDOMINAL WASHOUT, ABDOMINAL WOUND VAC;  Surgeon: Madilyn Hook, DO;  Location: [facility];  Service: General;  Laterality: N/A;  . Spinal fixation surgery w/ implant  AS CHILD    [place] R[facility]  . 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 Umbilical hernia  repair  03-30-2007  . Right colectomy/  appendectomy/  ileostomy  NOV 2014  [facility]  . Incision and drainage of wound N/A 11/04/2013    Procedure: IRRIGATION AND DEBRIDEMENT ABDOMINAL WOUND WITH PLACEMENT OF ACELL/VAC;  Surgeon: Theodoro Kos, DO;  Location: [facility];  Service: Plastics;  Laterality: N/A;       History of present illness and  [name] Course:     Kindly see H&P for history of present illness and admission details, please review complete Labs, Consult reports and Test reports for all details in brief  HPI  from the history and physical done on the day of admission  [name] is a 34 y.o. male with a history of Spina Bifida with Paraplegia, HTN Borderline DM2, Neurogenic Bladder, and Chiari Malformation with recent Replacement of VP Shunt on 07/10/2015 at [name] who presents to the ED with complaints of headache and not acting like himself per his mother who is at the bedside. He was evaluated and  found to have hypotension with a BP of 74/61. A Sepsis workup was initiated and his labs revealed AKI, and Hyperkalemia, and a Lactic Acidosis of 2.65. Cultures of the urine and Blood were sent, and he was placed on IV Rocephin, and then expanded to IV Zosyn with a 1 time dose of Linezolid (due to a Vancomycin Allergy) and placed on IVFs for fluid resuscitation. A Ct scan of the Head was negative for acute findings, and he was admitted to a Stepdown unit bed.   [name] Course   Sepsis/UTI - Patient presents with severe sepsis including elevated lactic acid, leukocytosis and hypotension. - This is most likely related to UTI, treated initially with IV Zosyn, urine culture showing enterococcus species, pansensitive, transitioned to IV ampicillin, will be discharged on by mouth amoxicillin for another 7 days to finish total of 10 days of treatment. - Physiology of sepsis is resolved, will hold metoprolol on discharge giving soft blood pressure. - Blood cultures remain negative at day of discharge   Acute renal failure - due to volume depletion. - Avoid nephrotoxic medication, treated with IV fluids. - No evidence of urinary retention on straight cath - Resolved, creatinine is 0.47 on discharge  Recent history of VP shunt replacement - Was done at [name], repeat CT head with no evidence of hydrocephalus, shows normal size ventricles, patient denies any headache, blurry vision or vomiting.  Hypokalemia - Repleted  Hyponatremia - Due to volume depletion, resolved  Borderline diabetes   Spina bifida with paraplegia - Congenital, continue supportive care  Anemia - At baseline, patient with drop in hemoglobin during [name] stay, and this is most likely delusional, baseline hemoglobin at [name] on 07/13/2015 was 7.9, hemoglobin on discharge is 8.6   Discharge Condition:  Stable   Follow UP  Follow-up Information    Follow up with Elyn Peers, MD. Call in 1 week.    Specialty:  Family Medicine   Why:  Posthospitalization follow-up   Contact information:   [name] [address] [name] [address] [phone]         Discharge Instructions  and  Discharge Medications     Discharge Instructions    Discharge instructions    Complete by:  As directed   Follow with Primary MD Elyn Peers, MD in 7 days   Get CBC, CMP checked  by Primary MD next visit.     Disposition Home    Diet: Regular , with feeding assistance  and aspiration precautions.  For Heart failure patients - Check your Weight same time everyday, if you gain over 2 pounds, or you develop in leg swelling, experience more shortness of breath or chest pain, call your Primary MD immediately. Follow Cardiac Low Salt Diet and 1.5 lit/day fluid restriction.   On your next visit with your primary care physician please Get Medicines reviewed and adjusted.   Please request your Prim.MD to go over all [name] Tests and Procedure/Radiological results at the follow up, please get all [name] records sent to your Prim MD by signing [name] release before you go home.   If you experience worsening of your admission symptoms, develop shortness of breath, life threatening emergency, suicidal or homicidal thoughts you must seek medical attention immediately by calling 911 or calling your MD immediately  if symptoms less severe.  You Must read complete instructions/literature along with all the possible adverse reactions/side effects for all the Medicines you take and that have been prescribed to you. Take any new Medicines after you have completely understood and accpet all the possible adverse reactions/side effects.   Do not drive, operating heavy machinery, perform activities at heights, swimming or participation in water activities or provide baby sitting services if your were admitted for syncope or siezures until you have seen by Primary MD or a Neurologist and advised to do so  again.  Do not drive when taking Pain medications.    Do not take more than prescribed Pain, Sleep and Anxiety Medications  Special Instructions: If you have smoked or chewed Tobacco  in the last 2 yrs please stop smoking, stop any regular Alcohol  and or any Recreational drug use.  Wear Seat belts while driving.   Please note  You were cared for by a hospitalist during your [name] stay. If you have any questions about your discharge medications or the care you received while you were in the [name] after you are discharged, you can call the unit and asked to speak with the hospitalist on call if the hospitalist that took care of you is not available. Once you are discharged, your primary care physician will handle any further medical issues. Please note that NO REFILLS for any discharge medications will be authorized once you are discharged, as it is imperative that you return to your primary care physician (or establish a relationship with a primary care physician if you do not have one) for your aftercare needs so that they can reassess your need for medications and monitor your lab values.     Increase activity slowly    Complete by:  As directed             Medication List    STOP taking these medications        cefTRIAXone 1 g in dextrose 5 % 50 mL     feeding supplement (ENSURE COMPLETE) Liqd     magnesium oxide 400 MG tablet  Commonly known as:  MAG-OX     metoprolol 50 MG tablet  Commonly known as:  LOPRESSOR      TAKE these medications        acetaminophen 650 MG CR tablet  Commonly known as:  TYLENOL  Take 1,300 mg by mouth every 8 (eight) hours as needed for pain.     amoxicillin 875 MG tablet  Commonly known as:  AMOXIL  Take 1 tablet (875 mg total) by mouth 2 (two) times daily. Please take for next 7 days  Cranberry 400 MG Caps  Take 400 mg by mouth daily.     multivitamin tablet  Take 1 tablet by mouth daily.     ondansetron 4 MG tablet   Commonly known as:  ZOFRAN  Take 4 mg by mouth every 8 (eight) hours as needed for nausea or vomiting.     oxyCODONE 5 MG immediate release tablet  Commonly known as:  Oxy IR/ROXICODONE  Take 1 tablet (5 mg total) by mouth every 6 (six) hours as needed. 1 by mouth every 3 hours as needed for MILD pain, take 2 by mouth every 3 hours as needed for MODERATE pain, 3 by mouth every 3 hours as needed for SEVERE pain     Potassium Chloride ER 20 MEQ Tbcr  Take 20 mEq by mouth daily.     PROMOGRAN EX  Apply 1 application topically daily as needed (bedsores). Tear off 1 piece of promogran, apply normal saline to promogran,then adhere to skin and cover          Diet and Activity recommendation: See Discharge Instructions above   Consults obtained -  none   Major procedures and Radiology Reports - PLEASE review detailed and final reports for all details, in brief -      Dg Skull 1-3 Views  07/07/2015  CLINICAL DATA:  Headache for 3 days, shunt malfunction EXAM: SKULL - 1-3 VIEW COMPARISON:  10/25/2014 FINDINGS: There is no evidence of skull fracture or other focal bone lesions. Again noted 2 shunt catheters in right scalp and right soft tissue neck. Again noted 1 of the catheters blind ending. Stable a catheter fragment in right soft tissue neck. IMPRESSION: Again noted 2 shunt catheters in right scalp and right soft tissue neck. Again noted 1 of the catheters blind ending. Stable a catheter fragment in right soft tissue neck. Electronically Signed   By: Lahoma Crocker M.D.   On: 07/07/2015 12:15   Dg Chest 1 View  07/22/2015  CLINICAL DATA:  Ventriculo pleural shunt EXAM: CHEST  1 VIEW; ABDOMEN - 1 VIEW COMPARISON:  Chest x-ray 07/07/2015 FINDINGS: There appears to be an old disconnected shunt tubing and a new shunt tube which appears intact. The cardiac silhouette, mediastinal and hilar contours are within normal limits and stable. Stable spinal hardware. The left spinal rod is fractured but  stable. No pleural effusions or pneumothorax. Abdominal film demonstrates a distended gas-filled stomach. Scattered air in the small bowel. No definite free air. The disconnected shunt tube is in the right upper quadrant area. The new shunt catheter tip could be in the right pleural space or it may be intravascular in the SVC. IMPRESSION: No acute cardiopulmonary findings. Distended gas filled stomach could be due to gastroparesis or gastric outlet obstruction. Shunt catheters as above. Electronically Signed   By: Marijo Sanes M.D.   On: 07/22/2015 21:11   Dg Chest 1 View  07/07/2015  CLINICAL DATA:  Hydrocephalus with ventriculoperitoneal shunt. Headache for 3 days. EXAM: CHEST 1 VIEW COMPARISON:  11/06/2014. FINDINGS: Trachea is midline. Thoracic anatomy is distorted by scoliosis. Lungs are grossly clear. IMPRESSION: Lungs are grossly clear. Electronically Signed   By: Lorin Picket M.D.   On: 07/07/2015 12:14   Dg Abd 1 View  07/22/2015  CLINICAL DATA:  Ventriculo pleural shunt EXAM: CHEST  1 VIEW; ABDOMEN - 1 VIEW COMPARISON:  Chest x-ray 07/07/2015 FINDINGS: There appears to be an old disconnected shunt tubing and a new shunt tube which appears intact. The cardiac  silhouette, mediastinal and hilar contours are within normal limits and stable. Stable spinal hardware. The left spinal rod is fractured but stable. No pleural effusions or pneumothorax. Abdominal film demonstrates a distended gas-filled stomach. Scattered air in the small bowel. No definite free air. The disconnected shunt tube is in the right upper quadrant area. The new shunt catheter tip could be in the right pleural space or it may be intravascular in the SVC. IMPRESSION: No acute cardiopulmonary findings. Distended gas filled stomach could be due to gastroparesis or gastric outlet obstruction. Shunt catheters as above. Electronically Signed   By: Marijo Sanes M.D.   On: 07/22/2015 21:11   Ct Head Wo Contrast  07/22/2015  CLINICAL  DATA:  The patient's mother said her son just had a shunt placed at [name] on July 10, 2015. The patient went home, but was treated for a UTI there prior to leaving. He had a follow up and had his staples removed and still did not have any problems. The patient's mother said he woke up with a headache and was not acting right. She does not know if he is having problems with his shunt or if it is a UTI again EXAM: CT HEAD WITHOUT CONTRAST TECHNIQUE: Contiguous axial images were obtained from the base of the skull through the vertex without intravenous contrast. COMPARISON:  07/07/2015 FINDINGS: Right parietal shocked extends across midline to the left anterior lateral ventricle. This is stable from prior exam. The ventricles are normal in overall size. Hydronephrosis seen on the prior exam it is no longer present. There are no parenchymal masses or mass effect. 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 There is no evidence of a cortical infarct. There are no extra-axial masses or abnormal fluid collections. There is no intracranial hemorrhage. Visualized sinuses and mastoid air cells are clear. IMPRESSION: 1. No acute intracranial abnormalities. 2. Ventricles are normal in size. No hydrocephalus. This supports a normal functioning shunt. Hydrocephalus seen on the recent prior study has resolved. Electronically Signed   By: Lajean Manes M.D.   On: 07/22/2015 20:52   Ct Head Wo Contrast  07/07/2015  CLINICAL DATA:  Patient with headache for 3 days. 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 VP shunt. EXAM: CT HEAD WITHOUT CONTRAST TECHNIQUE: Contiguous axial images were obtained from the base of the skull through the vertex without intravenous contrast. COMPARISON:  Brain CT 11/06/2014 FINDINGS: Right posterior parietal approach VP shunt catheter is present with tip terminating in the anterior aspect of  the left lateral ventricle. The ventriculostomy catheter is disconnected from the reservoir along the lateral aspect of the skull (image 18; series 2). Re- demonstrated diffuse ventriculomegaly of the lateral and third ventricles. There is diffuse periventricular hypodensity involving the lateral ventricles, most compatible with transependymal flow of CSF. No significant mass effect or midline shift. Agenesis of the corpus callosum. Unchanged calcification along the posterior aspect of the falx. No extra-axial fluid collections. No intracranial hemorrhage, mass lesion or evidence for acute cortically based infarct. The cerebellar tonsils are present within the foramen magnum, unchanged. IMPRESSION: The ventriculostomy catheter is disconnected from the reservoir at the junction along the lateral skull. Persistent dilatation of the lateral and third ventricles. There is periventricular white matter hypodensity about the lateral ventricles most suggestive of transependymal flow of CSF. These results were called by telephone at the time of interpretation on 07/07/2015 at 12:56 pm to  Dr. Brantley Stage , who verbally acknowledged these results. Electronically Signed   By: Lovey Newcomer M.D.   On: 07/07/2015 13:52   Dg Chest Port 1 View  07/07/2015  CLINICAL DATA:  Patient status post ET tube placement. EXAM: PORTABLE CHEST 1 VIEW COMPARISON:  Chest radiograph 07/07/2015 FINDINGS: Interval insertion ET tube terminating in the mid trachea. Stable cardiac and mediastinal contours. No consolidative pulmonary opacities. No pleural effusion or pneumothorax. IMPRESSION: ET tube terminates in the mid trachea. Electronically Signed   By: Lovey Newcomer M.D.   On: 07/07/2015 16:30    Micro Results     Recent Results (from the past 240 hour(s))  Urine culture     Status: None   Collection Time: 07/22/15  6:46 PM  Result Value Ref Range Status   Specimen Description URINE, RANDOM  Final   Special Requests NONE  Final   Culture  >=100,000 COLONIES/mL ENTEROCOCCUS SPECIES  Final   Report Status 07/24/2015 FINAL  Final   Organism ID, Bacteria ENTEROCOCCUS SPECIES  Final      Susceptibility   Enterococcus species - MIC*    AMPICILLIN <=2 SENSITIVE Sensitive     LEVOFLOXACIN 1 SENSITIVE Sensitive     NITROFURANTOIN <=16 SENSITIVE Sensitive     VANCOMYCIN 1 SENSITIVE Sensitive     * >=100,000 COLONIES/mL ENTEROCOCCUS SPECIES  Blood culture (routine x 2)     Status: None (Preliminary result)   Collection Time: 07/22/15  9:55 PM  Result Value Ref Range Status   Specimen Description BLOOD RIGHT ARM  Final   Special Requests IN PEDIATRIC BOTTLE 4CC  Final   Culture NO GROWTH 3 DAYS  Final   Report Status PENDING  Incomplete  Blood culture (routine x 2)     Status: None (Preliminary result)   Collection Time: 07/22/15  9:57 PM  Result Value Ref Range Status   Specimen Description BLOOD RIGHT HAND  Final   Special Requests IN PEDIATRIC BOTTLE 4CC  Final   Culture NO GROWTH 3 DAYS  Final   Report Status PENDING  Incomplete  MRSA PCR Screening     Status: None   Collection Time: 07/22/15 11:44 PM  Result Value Ref Range Status   MRSA by PCR NEGATIVE NEGATIVE Final    Comment:        The GeneXpert MRSA Assay (FDA approved for NASAL specimens only), is one component of a comprehensive MRSA colonization surveillance program. It is not intended to diagnose MRSA infection nor to guide or monitor treatment for MRSA infections.        Today   Subjective:   [name] today has no headache,no chest abdominal pain,no new weakness tingling or numbness, feels much better wants to go home today.   Objective:   Blood pressure 94/49, pulse 94, temperature 98.5 F (36.9 C), temperature source Oral, resp. rate 20, height 5' (1.524 m), weight 67.1 kg (147 lb 14.9 oz), SpO2 100 %.   Intake/Output Summary (Last 24 hours) at 07/25/15 1048 Last data filed at 07/25/15 0509  Gross per 24 hour  Intake    980 ml    Output   2250 ml  Net  -1270 ml    Exam Awake Alert, Oriented X 3, Normal affect Supple Neck,No JVD, Symmetrical Chest wall movement, Good air movement bilaterally, CTAB RRR,No Gallops,Rubs or new Murmurs, No Parasternal Heave +ve B.Sounds, Abd Soft, No tenderness, No rebound - guarding or rigidity, ileostomy plus with good output. No Cyanosis, Clubbing or edema,  patient with spina bifida.  Data Review   CBC w Diff: Lab Results  Component Value Date   WBC 8.6 07/25/2015   HGB 8.6* 07/25/2015   HCT 28.5* 07/25/2015   PLT 289 07/25/2015   LYMPHOPCT 8 07/07/2015   MONOPCT 4 07/07/2015   EOSPCT 0 07/07/2015   BASOPCT 0 07/07/2015    CMP: Lab Results  Component Value Date   NA 137 07/25/2015   K 3.6 07/25/2015   CL 105 07/25/2015   CO2 24 07/25/2015   BUN 7 07/25/2015   CREATININE 0.47* 07/25/2015   PROT 8.3* 07/07/2015   ALBUMIN 3.8 07/07/2015   BILITOT 0.4 07/07/2015   ALKPHOS 96 07/07/2015   AST 56* 07/07/2015   ALT 72* 07/07/2015  .   Total Time in preparing paper work, data evaluation and todays exam - 35 minutes  Clifford Benninger M.D on 07/25/2015 at 10:48 AM  [facility]   Office  [phone]

## 2015-07-25 NOTE — Discharge Instructions (Signed)
Follow with Primary MD Elyn Peers, MD in 7 days   Get CBC, CMP checked  by Primary MD next visit.     Disposition Home    Diet: Regular , with feeding assistance and aspiration precautions.  For Heart failure patients - Check your Weight same time everyday, if you gain over 2 pounds, or you develop in leg swelling, experience more shortness of breath or chest pain, call your Primary MD immediately. Follow Cardiac Low Salt Diet and 1.5 lit/day fluid restriction.   On your next visit with your primary care physician please Get Medicines reviewed and adjusted.   Please request your Prim.MD to go over all Hospital Tests and Procedure/Radiological results at the follow up, please get all Hospital records sent to your Prim MD by signing hospital release before you go home.   If you experience worsening of your admission symptoms, develop shortness of breath, life threatening emergency, suicidal or homicidal thoughts you must seek medical attention immediately by calling 911 or calling your MD immediately  if symptoms less severe.  You Must read complete instructions/literature along with all the possible adverse reactions/side effects for all the Medicines you take and that have been prescribed to you. Take any new Medicines after you have completely understood and accpet all the possible adverse reactions/side effects.   Do not drive, operating heavy machinery, perform activities at heights, swimming or participation in water activities or provide baby sitting services if your were admitted for syncope or siezures until you have seen by Primary MD or a Neurologist and advised to do so again.  Do not drive when taking Pain medications.    Do not take more than prescribed Pain, Sleep and Anxiety Medications  Special Instructions: If you have smoked or chewed Tobacco  in the last 2 yrs please stop smoking, stop any regular Alcohol  and or any Recreational drug use.  Wear Seat belts while  driving.   Please note  You were cared for by a hospitalist during your hospital stay. If you have any questions about your discharge medications or the care you received while you were in the hospital after you are discharged, you can call the unit and asked to speak with the hospitalist on call if the hospitalist that took care of you is not available. Once you are discharged, your primary care physician will handle any further medical issues. Please note that NO REFILLS for any discharge medications will be authorized once you are discharged, as it is imperative that you return to your primary care physician (or establish a relationship with a primary care physician if you do not have one) for your aftercare needs so that they can reassess your need for medications and monitor your lab values.

## 2015-07-26 NOTE — Discharge Summary (Signed)
[name], is a 34 y.o. male  DOB 03-Oct-1980  MRN [id number].  Admission date:  07/22/2015  Admitting Physician  Theressa Millard, MD  Discharge Date:  07/26/2015   Primary MD  Elyn Peers, MD  Recommendations for primary care physician for things to follow:  - To have basic labs including CBC, BMP during next visit. - Metoprolol has been stopped on discharge, is reevaluated during next visit to resume blood pressure is elevated.   Admission Diagnosis  Leukocytosis [D72.829] AKI (acute kidney injury) ([place]) [N17.9] Severe sepsis ([place]) [A41.9, R65.20] Urinary tract infection without hematuria, site unspecified [N39.0]   Discharge Diagnosis  Leukocytosis [D72.829] AKI (acute kidney injury) ([place]) [N17.9] Severe sepsis ([place]) [A41.9, R65.20] Urinary tract infection without hematuria, site unspecified [N39.0]    Principal Problem:   Severe sepsis ([place]) Active Problems:   Severe sepsis with septic shock (HCC)   H/O ventricular shunt   AKI (acute kidney injury) ([place])   Hyperkalemia   Hyponatremia   Thrombocytosis (HCC)   Leukocytosis   Borderline diabetes   Arnold-Chiari malformation ([place])      Past Medical History  Diagnosis Date  . Hypertension   . GERD (gastroesophageal reflux disease)   . Non-healing surgical wound     ABDOMINE  . Spina bifida with hydrocephalus, lumbar region ([place])     CONGENITAL--  HAS VP SHUNT--  PARALYZIED MID ABDOMINE DOWN  . Arnold-Chiari malformation ([place])   . Neurogenic bladder     INCONTINENT -  . 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 Ileostomy in place [facility])   . Wheelchair bound     CAN TRASFER SELF WITHOUT BOARD  . Urinary incontinence with continuous leakage   . Wears glasses   . Pressure ulcer, buttock   . Borderline diabetes     DIET CONTROLLED  . Heart palpitations     SECONDARY TO VP SHUNT DRAIN  .  History of seizure as newborn     NO MEDS SINCE 34 YRS OLD--  NO ISSUES SINCE  . Neurogenic bowel   . At risk for sleep apnea     STOP-BANG = 4   SENT TO PCP 11-01-2013    Past Surgical History  Procedure Laterality Date  . Ventriculoperitoneal shunt  LAST REVISION NOV 2014    DUE TO ABD WOUND--- SHUNT DRAINS TO HEART  . Hip surgery Bilateral AS CHILD    TENDON RELEASE  . Cholecystectomy N/A 07/06/2013    Procedure: LAPAROSCOPIC CHOLECYSTECTOMY WITH INTRAOPERATIVE CHOLANGIOGRAM;  Surgeon: Madilyn Hook, DO;  Location: [facility];  Service: General;  Laterality: N/A;  . Colon resection N/A 07/08/2013    Procedure: EXPLORATORY LAPAROSCOPY, DIAGNOSTIC LAPAROSCOPY, PARTIAL COLECTOMY, ABDOMINAL WASHOUT, ABDOMINAL WOUND VAC;  Surgeon: Madilyn Hook, DO;  Location: [facility];  Service: General;  Laterality: N/A;  . Spinal fixation surgery w/ implant  AS CHILD    [place] [facility]  . 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 Umbilical hernia  repair  03-30-2007  . Right colectomy/  appendectomy/  ileostomy  NOV 2014  [facility]  . Incision and drainage of wound N/A 11/04/2013    Procedure: IRRIGATION AND DEBRIDEMENT ABDOMINAL WOUND WITH PLACEMENT OF ACELL/VAC;  Surgeon: Theodoro Kos, DO;  Location: [place];  Service: Plastics;  Laterality: N/A;       History of present illness and  Hospital Course:     Kindly see H&P for history of present illness and admission details, please review complete Labs, Consult reports and Test reports for all details in brief  HPI  from the history and physical done on the day of admission  [name] is a 34 y.o. male with a history of Spina Bifida with Paraplegia, HTN Borderline DM2, Neurogenic Bladder, and Chiari Malformation with recent Replacement of VP Shunt on 07/10/2015 at [facility] who presents to the ED with complaints of headache and not acting like himself per his mother who is at the bedside. He was evaluated and  found to have hypotension with a BP of 74/61. A Sepsis workup was initiated and his labs revealed AKI, and Hyperkalemia, and a Lactic Acidosis of 2.65. Cultures of the urine and Blood were sent, and he was placed on IV Rocephin, and then expanded to IV Zosyn with a 1 time dose of Linezolid (due to a Vancomycin Allergy) and placed on IVFs for fluid resuscitation. A Ct scan of the Head was negative for acute findings, and he was admitted to a Stepdown unit bed.   Hospital Course   Sepsis/UTI - Patient presents with severe sepsis including elevated lactic acid, leukocytosis and hypotension. - This is most likely related to UTI, treated initially with IV Zosyn, urine culture showing enterococcus species, pansensitive, transitioned to IV ampicillin, will be discharged on by mouth amoxicillin for another 7 days to finish total of 10 days of treatment. - Physiology of sepsis is resolved, will hold metoprolol on discharge giving soft blood pressure. - Blood cultures remain negative at day of discharge   Acute renal failure - due to volume depletion. - Avoid nephrotoxic medication, treated with IV fluids. - No evidence of urinary retention on straight cath - Resolved, creatinine is 0.47 on discharge  Recent history of VP shunt replacement - Was done at [facility], repeat CT head with no evidence of hydrocephalus, shows normal size ventricles, patient denies any headache, blurry vision or vomiting.  Hypokalemia - Repleted  Hyponatremia - Due to volume depletion, resolved  Borderline diabetes   Spina bifida with paraplegia - Congenital, continue supportive care  Anemia - At baseline, patient with drop in hemoglobin during hospital stay, and this is most likely delusional, baseline hemoglobin at [facility] on 07/13/2015 was 7.9, hemoglobin on discharge is 8.6   Discharge Condition:  Stable   Follow UP  Follow-up Information    Follow up with Elyn Peers, MD. Call in 1 week.    Specialty:  Family Medicine   Why:  Posthospitalization follow-up   Contact information:   [address] [employer] [place] [address] [phone]         Discharge Instructions  and  Discharge Medications         Discharge Instructions    Discharge instructions    Complete by:  As directed   Follow with Primary MD Elyn Peers, MD in 7 days   Get CBC, CMP checked  by Primary MD next visit.     Disposition Home    Diet: Regular ,  with feeding assistance and aspiration precautions.  For Heart failure patients - Check your Weight same time everyday, if you gain over 2 pounds, or you develop in leg swelling, experience more shortness of breath or chest pain, call your Primary MD immediately. Follow Cardiac Low Salt Diet and 1.5 lit/day fluid restriction.   On your next visit with your primary care physician please Get Medicines reviewed and adjusted.   Please request your Prim.MD to go over all Hospital Tests and Procedure/Radiological results at the follow up, please get all Hospital records sent to your Prim MD by signing hospital release before you go home.   If you experience worsening of your admission symptoms, develop shortness of breath, life threatening emergency, suicidal or homicidal thoughts you must seek medical attention immediately by calling 911 or calling your MD immediately  if symptoms less severe.  You Must read complete instructions/literature along with all the possible adverse reactions/side effects for all the Medicines you take and that have been prescribed to you. Take any new Medicines after you have completely understood and accpet all the possible adverse reactions/side effects.   Do not drive, operating heavy machinery, perform activities at heights, swimming or participation in water activities or provide baby sitting services if your were admitted for syncope or siezures until you have seen by Primary MD or a Neurologist and advised to do so  again.  Do not drive when taking Pain medications.    Do not take more than prescribed Pain, Sleep and Anxiety Medications  Special Instructions: If you have smoked or chewed Tobacco  in the last 2 yrs please stop smoking, stop any regular Alcohol  and or any Recreational drug use.  Wear Seat belts while driving.   Please note  You were cared for by a hospitalist during your hospital stay. If you have any questions about your discharge medications or the care you received while you were in the hospital after you are discharged, you can call the unit and asked to speak with the hospitalist on call if the hospitalist that took care of you is not available. Once you are discharged, your primary care physician will handle any further medical issues. Please note that NO REFILLS for any discharge medications will be authorized once you are discharged, as it is imperative that you return to your primary care physician (or establish a relationship with a primary care physician if you do not have one) for your aftercare needs so that they can reassess your need for medications and monitor your lab values.     Increase activity slowly    Complete by:  As directed             Medication List    STOP taking these medications        cefTRIAXone 1 g in dextrose 5 % 50 mL     feeding supplement (ENSURE COMPLETE) Liqd     magnesium oxide 400 MG tablet  Commonly known as:  MAG-OX     metoprolol 50 MG tablet  Commonly known as:  LOPRESSOR      TAKE these medications        acetaminophen 650 MG CR tablet  Commonly known as:  TYLENOL  Take 1,300 mg by mouth every 8 (eight) hours as needed for pain.     amoxicillin 875 MG tablet  Commonly known as:  AMOXIL  Take 1 tablet (875 mg total) by mouth 2 (two) times daily. Please take for next 7 days  Cranberry 400 MG Caps  Take 400 mg by mouth daily.     multivitamin tablet  Take 1 tablet by mouth daily.     ondansetron 4 MG tablet    Commonly known as:  ZOFRAN  Take 4 mg by mouth every 8 (eight) hours as needed for nausea or vomiting.     oxyCODONE 5 MG immediate release tablet  Commonly known as:  Oxy IR/ROXICODONE  Take 1 tablet (5 mg total) by mouth every 6 (six) hours as needed. 1 by mouth every 3 hours as needed for MILD pain, take 2 by mouth every 3 hours as needed for MODERATE pain, 3 by mouth every 3 hours as needed for SEVERE pain     Potassium Chloride ER 20 MEQ Tbcr  Take 20 mEq by mouth daily.     PROMOGRAN EX  Apply 1 application topically daily as needed (bedsores). Tear off 1 piece of promogran, apply normal saline to promogran,then adhere to skin and cover          Diet and Activity recommendation: See Discharge Instructions above   Consults obtained -  none   Major procedures and Radiology Reports - PLEASE review detailed and final reports for all details, in brief -      Dg Skull 1-3 Views  07/07/2015  CLINICAL DATA:  Headache for 3 days, shunt malfunction EXAM: SKULL - 1-3 VIEW COMPARISON:  10/25/2014 FINDINGS: There is no evidence of skull fracture or other focal bone lesions. Again noted 2 shunt catheters in right scalp and right soft tissue neck. Again noted 1 of the catheters blind ending. Stable a catheter fragment in right soft tissue neck. IMPRESSION: Again noted 2 shunt catheters in right scalp and right soft tissue neck. Again noted 1 of the catheters blind ending. Stable a catheter fragment in right soft tissue neck. Electronically Signed   By: Lahoma Crocker M.D.   On: 07/07/2015 12:15   Dg Chest 1 View  07/22/2015  CLINICAL DATA:  Ventriculo pleural shunt EXAM: CHEST  1 VIEW; ABDOMEN - 1 VIEW COMPARISON:  Chest x-ray 07/07/2015 FINDINGS: There appears to be an old disconnected shunt tubing and a new shunt tube which appears intact. The cardiac silhouette, mediastinal and hilar contours are within normal limits and stable. Stable spinal hardware. The left spinal rod is fractured but  stable. No pleural effusions or pneumothorax. Abdominal film demonstrates a distended gas-filled stomach. Scattered air in the small bowel. No definite free air. The disconnected shunt tube is in the right upper quadrant area. The new shunt catheter tip could be in the right pleural space or it may be intravascular in the SVC. IMPRESSION: No acute cardiopulmonary findings. Distended gas filled stomach could be due to gastroparesis or gastric outlet obstruction. Shunt catheters as above. Electronically Signed   By: Marijo Sanes M.D.   On: 07/22/2015 21:11   Dg Chest 1 View  07/07/2015  CLINICAL DATA:  Hydrocephalus with ventriculoperitoneal shunt. Headache for 3 days. EXAM: CHEST 1 VIEW COMPARISON:  11/06/2014. FINDINGS: Trachea is midline. Thoracic anatomy is distorted by scoliosis. Lungs are grossly clear. IMPRESSION: Lungs are grossly clear. Electronically Signed   By: Lorin Picket M.D.   On: 07/07/2015 12:14   Dg Abd 1 View  07/22/2015  CLINICAL DATA:  Ventriculo pleural shunt EXAM: CHEST  1 VIEW; ABDOMEN - 1 VIEW COMPARISON:  Chest x-ray 07/07/2015 FINDINGS: There appears to be an old disconnected shunt tubing and a new shunt tube which appears intact. The  cardiac silhouette, mediastinal and hilar contours are within normal limits and stable. Stable spinal hardware. The left spinal rod is fractured but stable. No pleural effusions or pneumothorax. Abdominal film demonstrates a distended gas-filled stomach. Scattered air in the small bowel. No definite free air. The disconnected shunt tube is in the right upper quadrant area. The new shunt catheter tip could be in the right pleural space or it may be intravascular in the SVC. IMPRESSION: No acute cardiopulmonary findings. Distended gas filled stomach could be due to gastroparesis or gastric outlet obstruction. Shunt catheters as above. Electronically Signed   By: Marijo Sanes M.D.   On: 07/22/2015 21:11   Ct Head Wo Contrast  07/22/2015  CLINICAL  DATA:  The patient's mother said her son just had a shunt placed at [facility] on July 10, 2015. The patient went home, but was treated for a UTI there prior to leaving. He had a follow up and had his staples removed and still did not have any problems. The patient's mother said he woke up with a headache and was not acting right. She does not know if he is having problems with his shunt or if it is a UTI again EXAM: CT HEAD WITHOUT CONTRAST TECHNIQUE: Contiguous axial images were obtained from the base of the skull through the vertex without intravenous contrast. COMPARISON:  07/07/2015 FINDINGS: Right parietal shocked extends across midline to the left anterior lateral ventricle. This is stable from prior exam. The ventricles are normal in overall size. Hydronephrosis seen on the prior exam it is no longer present. There are no parenchymal masses or mass effect. 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 There is no evidence of a cortical infarct. There are no extra-axial masses or abnormal fluid collections. There is no intracranial hemorrhage. Visualized sinuses and mastoid air cells are clear. IMPRESSION: 1. No acute intracranial abnormalities. 2. Ventricles are normal in size. No hydrocephalus. This supports a normal functioning shunt. Hydrocephalus seen on the recent prior study has resolved. Electronically Signed   By: Lajean Manes M.D.   On: 07/22/2015 20:52   Ct Head Wo Contrast  07/07/2015  CLINICAL DATA:  Patient with headache for 3 days. 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 VP shunt. EXAM: CT HEAD WITHOUT CONTRAST TECHNIQUE: Contiguous axial images were obtained from the base of the skull through the vertex without intravenous contrast. COMPARISON:  Brain CT 11/06/2014 FINDINGS: Right posterior parietal approach VP shunt catheter is present with tip terminating in the anterior aspect of  the left lateral ventricle. The ventriculostomy catheter is disconnected from the reservoir along the lateral aspect of the skull (image 18; series 2). Re- demonstrated diffuse ventriculomegaly of the lateral and third ventricles. There is diffuse periventricular hypodensity involving the lateral ventricles, most compatible with transependymal flow of CSF. No significant mass effect or midline shift. Agenesis of the corpus callosum. Unchanged calcification along the posterior aspect of the falx. No extra-axial fluid collections. No intracranial hemorrhage, mass lesion or evidence for acute cortically based infarct. The cerebellar tonsils are present within the foramen magnum, unchanged. IMPRESSION: The ventriculostomy catheter is disconnected from the reservoir at the junction along the lateral skull. Persistent dilatation of the lateral and third ventricles. There is periventricular white matter hypodensity about the lateral ventricles most suggestive of transependymal flow of CSF. These results were called by telephone at the time of interpretation on 07/07/2015 at 12:56 pm  to Dr. Brantley Stage , who verbally acknowledged these results. Electronically Signed   By: Lovey Newcomer M.D.   On: 07/07/2015 13:52   Dg Chest Port 1 View  07/07/2015  CLINICAL DATA:  Patient status post ET tube placement. EXAM: PORTABLE CHEST 1 VIEW COMPARISON:  Chest radiograph 07/07/2015 FINDINGS: Interval insertion ET tube terminating in the mid trachea. Stable cardiac and mediastinal contours. No consolidative pulmonary opacities. No pleural effusion or pneumothorax. IMPRESSION: ET tube terminates in the mid trachea. Electronically Signed   By: Lovey Newcomer M.D.   On: 07/07/2015 16:30    Micro Results     Recent Results (from the past 240 hour(s))  Urine culture     Status: None   Collection Time: 07/22/15  6:46 PM  Result Value Ref Range Status   Specimen Description URINE, RANDOM  Final   Special Requests NONE  Final   Culture  >=100,000 COLONIES/mL ENTEROCOCCUS SPECIES  Final   Report Status 07/24/2015 FINAL  Final   Organism ID, Bacteria ENTEROCOCCUS SPECIES  Final      Susceptibility   Enterococcus species - MIC*    AMPICILLIN <=2 SENSITIVE Sensitive     LEVOFLOXACIN 1 SENSITIVE Sensitive     NITROFURANTOIN <=16 SENSITIVE Sensitive     VANCOMYCIN 1 SENSITIVE Sensitive     * >=100,000 COLONIES/mL ENTEROCOCCUS SPECIES  Blood culture (routine x 2)     Status: None (Preliminary result)   Collection Time: 07/22/15  9:55 PM  Result Value Ref Range Status   Specimen Description BLOOD RIGHT ARM  Final   Special Requests IN PEDIATRIC BOTTLE 4CC  Final   Culture NO GROWTH 4 DAYS  Final   Report Status PENDING  Incomplete  Blood culture (routine x 2)     Status: None (Preliminary result)   Collection Time: 07/22/15  9:57 PM  Result Value Ref Range Status   Specimen Description BLOOD RIGHT HAND  Final   Special Requests IN PEDIATRIC BOTTLE 4CC  Final   Culture NO GROWTH 4 DAYS  Final   Report Status PENDING  Incomplete  MRSA PCR Screening     Status: None   Collection Time: 07/22/15 11:44 PM  Result Value Ref Range Status   MRSA by PCR NEGATIVE NEGATIVE Final    Comment:        The GeneXpert MRSA Assay (FDA approved for NASAL specimens only), is one component of a comprehensive MRSA colonization surveillance program. It is not intended to diagnose MRSA infection nor to guide or monitor treatment for MRSA infections.        Today   Subjective:   [name] today has no headache,no chest abdominal pain,no new weakness tingling or numbness, feels much better wants to go home today.   Objective:   Blood pressure 94/49, pulse 94, temperature 98.5 F (36.9 C), temperature source Oral, resp. rate 20, height 5' (1.524 m), weight 67.1 kg (147 lb 14.9 oz), SpO2 100 %.  No intake or output data in the 24 hours ending 07/26/15 1718  Exam Awake Alert, Oriented X 3, Normal affect Supple Neck,No  JVD, Symmetrical Chest wall movement, Good air movement bilaterally, CTAB RRR,No Gallops,Rubs or new Murmurs, No Parasternal Heave +ve B.Sounds, Abd Soft, No tenderness, No rebound - guarding or rigidity, ileostomy plus with good output. No Cyanosis, Clubbing or edema, patient with spina bifida.  Data Review   CBC w Diff:  Lab Results  Component Value Date   WBC 8.6 07/25/2015   HGB 8.6*  07/25/2015   HCT 28.5* 07/25/2015   PLT 289 07/25/2015   LYMPHOPCT 8 07/07/2015   MONOPCT 4 07/07/2015   EOSPCT 0 07/07/2015   BASOPCT 0 07/07/2015    CMP:  Lab Results  Component Value Date   NA 137 07/25/2015   K 3.6 07/25/2015   CL 105 07/25/2015   CO2 24 07/25/2015   BUN 7 07/25/2015   CREATININE 0.47* 07/25/2015   PROT 8.3* 07/07/2015   ALBUMIN 3.8 07/07/2015   BILITOT 0.4 07/07/2015   ALKPHOS 96 07/07/2015   AST 56* 07/07/2015   ALT 72* 07/07/2015  .   Total Time in preparing paper work, data evaluation and todays exam - 35 minutes  Cloy Cozzens M.D on 07/26/2015 at [place]  [phone]

## 2015-07-27 LAB — CULTURE, BLOOD (ROUTINE X 2)
Culture: NO GROWTH
Culture: NO GROWTH

## 2015-07-28 DIAGNOSIS — N390 Urinary tract infection, site not specified: Secondary | ICD-10-CM | POA: Diagnosis not present

## 2015-07-28 DIAGNOSIS — D649 Anemia, unspecified: Secondary | ICD-10-CM | POA: Diagnosis not present

## 2015-07-28 DIAGNOSIS — I10 Essential (primary) hypertension: Secondary | ICD-10-CM | POA: Diagnosis not present

## 2015-07-28 DIAGNOSIS — R7303 Prediabetes: Secondary | ICD-10-CM | POA: Diagnosis not present

## 2015-07-28 DIAGNOSIS — K592 Neurogenic bowel, not elsewhere classified: Secondary | ICD-10-CM | POA: Diagnosis not present

## 2015-07-28 DIAGNOSIS — R238 Other skin changes: Secondary | ICD-10-CM | POA: Diagnosis not present

## 2015-07-28 DIAGNOSIS — Q0703 Arnold-Chiari syndrome with spina bifida and hydrocephalus: Secondary | ICD-10-CM | POA: Diagnosis not present

## 2015-07-28 DIAGNOSIS — N319 Neuromuscular dysfunction of bladder, unspecified: Secondary | ICD-10-CM | POA: Diagnosis not present

## 2015-07-28 DIAGNOSIS — N3945 Continuous leakage: Secondary | ICD-10-CM | POA: Diagnosis not present

## 2015-07-28 DIAGNOSIS — Z7401 Bed confinement status: Secondary | ICD-10-CM | POA: Diagnosis not present

## 2015-07-28 DIAGNOSIS — G8220 Paraplegia, unspecified: Secondary | ICD-10-CM | POA: Diagnosis not present

## 2015-07-28 DIAGNOSIS — Z932 Ileostomy status: Secondary | ICD-10-CM | POA: Diagnosis not present

## 2015-07-28 DIAGNOSIS — L89322 Pressure ulcer of left buttock, stage 2: Secondary | ICD-10-CM | POA: Diagnosis not present

## 2015-07-31 DIAGNOSIS — E860 Dehydration: Secondary | ICD-10-CM | POA: Diagnosis not present

## 2015-07-31 DIAGNOSIS — A4189 Other specified sepsis: Secondary | ICD-10-CM | POA: Diagnosis not present

## 2015-07-31 DIAGNOSIS — I10 Essential (primary) hypertension: Secondary | ICD-10-CM | POA: Diagnosis not present

## 2015-08-03 DIAGNOSIS — L89313 Pressure ulcer of right buttock, stage 3: Secondary | ICD-10-CM | POA: Diagnosis not present

## 2015-08-03 DIAGNOSIS — Q0701 Arnold-Chiari syndrome with spina bifida: Secondary | ICD-10-CM | POA: Diagnosis not present

## 2015-08-03 DIAGNOSIS — G8220 Paraplegia, unspecified: Secondary | ICD-10-CM | POA: Diagnosis not present

## 2015-08-04 DIAGNOSIS — G8220 Paraplegia, unspecified: Secondary | ICD-10-CM | POA: Diagnosis not present

## 2015-08-04 DIAGNOSIS — L89313 Pressure ulcer of right buttock, stage 3: Secondary | ICD-10-CM | POA: Diagnosis not present

## 2015-08-04 DIAGNOSIS — I10 Essential (primary) hypertension: Secondary | ICD-10-CM | POA: Diagnosis not present

## 2015-08-04 DIAGNOSIS — Q059 Spina bifida, unspecified: Secondary | ICD-10-CM | POA: Diagnosis not present

## 2015-08-04 DIAGNOSIS — L89892 Pressure ulcer of other site, stage 2: Secondary | ICD-10-CM | POA: Diagnosis not present

## 2015-08-07 DIAGNOSIS — N319 Neuromuscular dysfunction of bladder, unspecified: Secondary | ICD-10-CM | POA: Diagnosis not present

## 2015-08-07 DIAGNOSIS — R7303 Prediabetes: Secondary | ICD-10-CM | POA: Diagnosis not present

## 2015-08-07 DIAGNOSIS — K592 Neurogenic bowel, not elsewhere classified: Secondary | ICD-10-CM | POA: Diagnosis not present

## 2015-08-07 DIAGNOSIS — D649 Anemia, unspecified: Secondary | ICD-10-CM | POA: Diagnosis not present

## 2015-08-07 DIAGNOSIS — R238 Other skin changes: Secondary | ICD-10-CM | POA: Diagnosis not present

## 2015-08-07 DIAGNOSIS — N390 Urinary tract infection, site not specified: Secondary | ICD-10-CM | POA: Diagnosis not present

## 2015-08-07 DIAGNOSIS — G8220 Paraplegia, unspecified: Secondary | ICD-10-CM | POA: Diagnosis not present

## 2015-08-07 DIAGNOSIS — Z932 Ileostomy status: Secondary | ICD-10-CM | POA: Diagnosis not present

## 2015-08-07 DIAGNOSIS — L89322 Pressure ulcer of left buttock, stage 2: Secondary | ICD-10-CM | POA: Diagnosis not present

## 2015-08-07 DIAGNOSIS — I10 Essential (primary) hypertension: Secondary | ICD-10-CM | POA: Diagnosis not present

## 2015-08-07 DIAGNOSIS — Z7401 Bed confinement status: Secondary | ICD-10-CM | POA: Diagnosis not present

## 2015-08-07 DIAGNOSIS — Q0703 Arnold-Chiari syndrome with spina bifida and hydrocephalus: Secondary | ICD-10-CM | POA: Diagnosis not present

## 2015-08-07 DIAGNOSIS — N3945 Continuous leakage: Secondary | ICD-10-CM | POA: Diagnosis not present

## 2015-08-14 DIAGNOSIS — N390 Urinary tract infection, site not specified: Secondary | ICD-10-CM | POA: Diagnosis not present

## 2015-08-14 DIAGNOSIS — G8220 Paraplegia, unspecified: Secondary | ICD-10-CM | POA: Diagnosis not present

## 2015-08-14 DIAGNOSIS — L89322 Pressure ulcer of left buttock, stage 2: Secondary | ICD-10-CM | POA: Diagnosis not present

## 2015-08-14 DIAGNOSIS — Q0703 Arnold-Chiari syndrome with spina bifida and hydrocephalus: Secondary | ICD-10-CM | POA: Diagnosis not present

## 2015-08-14 DIAGNOSIS — R7303 Prediabetes: Secondary | ICD-10-CM | POA: Diagnosis not present

## 2015-08-14 DIAGNOSIS — I10 Essential (primary) hypertension: Secondary | ICD-10-CM | POA: Diagnosis not present

## 2015-08-14 DIAGNOSIS — N319 Neuromuscular dysfunction of bladder, unspecified: Secondary | ICD-10-CM | POA: Diagnosis not present

## 2015-08-14 DIAGNOSIS — Z932 Ileostomy status: Secondary | ICD-10-CM | POA: Diagnosis not present

## 2015-08-14 DIAGNOSIS — R238 Other skin changes: Secondary | ICD-10-CM | POA: Diagnosis not present

## 2015-08-14 DIAGNOSIS — N3945 Continuous leakage: Secondary | ICD-10-CM | POA: Diagnosis not present

## 2015-08-14 DIAGNOSIS — K592 Neurogenic bowel, not elsewhere classified: Secondary | ICD-10-CM | POA: Diagnosis not present

## 2015-08-14 DIAGNOSIS — Z7401 Bed confinement status: Secondary | ICD-10-CM | POA: Diagnosis not present

## 2015-08-14 DIAGNOSIS — D649 Anemia, unspecified: Secondary | ICD-10-CM | POA: Diagnosis not present

## 2015-08-18 DIAGNOSIS — R7309 Other abnormal glucose: Secondary | ICD-10-CM | POA: Diagnosis not present

## 2015-08-18 DIAGNOSIS — F70 Mild intellectual disabilities: Secondary | ICD-10-CM | POA: Diagnosis not present

## 2015-08-18 DIAGNOSIS — I10 Essential (primary) hypertension: Secondary | ICD-10-CM | POA: Diagnosis not present

## 2015-08-18 DIAGNOSIS — Q054 Unspecified spina bifida with hydrocephalus: Secondary | ICD-10-CM | POA: Diagnosis not present

## 2015-08-18 DIAGNOSIS — E782 Mixed hyperlipidemia: Secondary | ICD-10-CM | POA: Diagnosis not present

## 2015-08-18 DIAGNOSIS — G919 Hydrocephalus, unspecified: Secondary | ICD-10-CM | POA: Diagnosis not present

## 2015-08-20 DIAGNOSIS — D649 Anemia, unspecified: Secondary | ICD-10-CM | POA: Diagnosis not present

## 2015-08-20 DIAGNOSIS — R238 Other skin changes: Secondary | ICD-10-CM | POA: Diagnosis not present

## 2015-08-20 DIAGNOSIS — R7303 Prediabetes: Secondary | ICD-10-CM | POA: Diagnosis not present

## 2015-08-20 DIAGNOSIS — N3945 Continuous leakage: Secondary | ICD-10-CM | POA: Diagnosis not present

## 2015-08-20 DIAGNOSIS — N390 Urinary tract infection, site not specified: Secondary | ICD-10-CM | POA: Diagnosis not present

## 2015-08-20 DIAGNOSIS — I10 Essential (primary) hypertension: Secondary | ICD-10-CM | POA: Diagnosis not present

## 2015-08-20 DIAGNOSIS — G8220 Paraplegia, unspecified: Secondary | ICD-10-CM | POA: Diagnosis not present

## 2015-08-20 DIAGNOSIS — Q0703 Arnold-Chiari syndrome with spina bifida and hydrocephalus: Secondary | ICD-10-CM | POA: Diagnosis not present

## 2015-08-20 DIAGNOSIS — K592 Neurogenic bowel, not elsewhere classified: Secondary | ICD-10-CM | POA: Diagnosis not present

## 2015-08-20 DIAGNOSIS — N319 Neuromuscular dysfunction of bladder, unspecified: Secondary | ICD-10-CM | POA: Diagnosis not present

## 2015-08-20 DIAGNOSIS — L89322 Pressure ulcer of left buttock, stage 2: Secondary | ICD-10-CM | POA: Diagnosis not present

## 2015-08-20 DIAGNOSIS — Z932 Ileostomy status: Secondary | ICD-10-CM | POA: Diagnosis not present

## 2015-08-20 DIAGNOSIS — Z7401 Bed confinement status: Secondary | ICD-10-CM | POA: Diagnosis not present

## 2015-08-27 DIAGNOSIS — R7303 Prediabetes: Secondary | ICD-10-CM | POA: Diagnosis not present

## 2015-08-27 DIAGNOSIS — N319 Neuromuscular dysfunction of bladder, unspecified: Secondary | ICD-10-CM | POA: Diagnosis not present

## 2015-08-27 DIAGNOSIS — Z932 Ileostomy status: Secondary | ICD-10-CM | POA: Diagnosis not present

## 2015-08-27 DIAGNOSIS — L89322 Pressure ulcer of left buttock, stage 2: Secondary | ICD-10-CM | POA: Diagnosis not present

## 2015-08-27 DIAGNOSIS — G8220 Paraplegia, unspecified: Secondary | ICD-10-CM | POA: Diagnosis not present

## 2015-08-27 DIAGNOSIS — Z7401 Bed confinement status: Secondary | ICD-10-CM | POA: Diagnosis not present

## 2015-08-27 DIAGNOSIS — D649 Anemia, unspecified: Secondary | ICD-10-CM | POA: Diagnosis not present

## 2015-08-27 DIAGNOSIS — K592 Neurogenic bowel, not elsewhere classified: Secondary | ICD-10-CM | POA: Diagnosis not present

## 2015-08-27 DIAGNOSIS — Q0703 Arnold-Chiari syndrome with spina bifida and hydrocephalus: Secondary | ICD-10-CM | POA: Diagnosis not present

## 2015-08-27 DIAGNOSIS — N3945 Continuous leakage: Secondary | ICD-10-CM | POA: Diagnosis not present

## 2015-08-27 DIAGNOSIS — N390 Urinary tract infection, site not specified: Secondary | ICD-10-CM | POA: Diagnosis not present

## 2015-08-27 DIAGNOSIS — R238 Other skin changes: Secondary | ICD-10-CM | POA: Diagnosis not present

## 2015-08-27 DIAGNOSIS — I10 Essential (primary) hypertension: Secondary | ICD-10-CM | POA: Diagnosis not present

## 2015-09-01 ENCOUNTER — Encounter (HOSPITAL_BASED_OUTPATIENT_CLINIC_OR_DEPARTMENT_OTHER): Payer: Medicare Other | Attending: General Surgery

## 2015-09-01 DIAGNOSIS — L89313 Pressure ulcer of right buttock, stage 3: Secondary | ICD-10-CM | POA: Diagnosis not present

## 2015-09-01 DIAGNOSIS — Z9049 Acquired absence of other specified parts of digestive tract: Secondary | ICD-10-CM | POA: Diagnosis not present

## 2015-09-01 DIAGNOSIS — I10 Essential (primary) hypertension: Secondary | ICD-10-CM | POA: Insufficient documentation

## 2015-09-01 DIAGNOSIS — Q059 Spina bifida, unspecified: Secondary | ICD-10-CM | POA: Insufficient documentation

## 2015-09-01 DIAGNOSIS — G8220 Paraplegia, unspecified: Secondary | ICD-10-CM | POA: Diagnosis not present

## 2015-09-03 DIAGNOSIS — I10 Essential (primary) hypertension: Secondary | ICD-10-CM | POA: Diagnosis not present

## 2015-09-03 DIAGNOSIS — N319 Neuromuscular dysfunction of bladder, unspecified: Secondary | ICD-10-CM | POA: Diagnosis not present

## 2015-09-03 DIAGNOSIS — K592 Neurogenic bowel, not elsewhere classified: Secondary | ICD-10-CM | POA: Diagnosis not present

## 2015-09-03 DIAGNOSIS — N390 Urinary tract infection, site not specified: Secondary | ICD-10-CM | POA: Diagnosis not present

## 2015-09-03 DIAGNOSIS — Q0703 Arnold-Chiari syndrome with spina bifida and hydrocephalus: Secondary | ICD-10-CM | POA: Diagnosis not present

## 2015-09-03 DIAGNOSIS — N3945 Continuous leakage: Secondary | ICD-10-CM | POA: Diagnosis not present

## 2015-09-03 DIAGNOSIS — Z932 Ileostomy status: Secondary | ICD-10-CM | POA: Diagnosis not present

## 2015-09-03 DIAGNOSIS — G8220 Paraplegia, unspecified: Secondary | ICD-10-CM | POA: Diagnosis not present

## 2015-09-03 DIAGNOSIS — R238 Other skin changes: Secondary | ICD-10-CM | POA: Diagnosis not present

## 2015-09-03 DIAGNOSIS — Z7401 Bed confinement status: Secondary | ICD-10-CM | POA: Diagnosis not present

## 2015-09-03 DIAGNOSIS — D649 Anemia, unspecified: Secondary | ICD-10-CM | POA: Diagnosis not present

## 2015-09-03 DIAGNOSIS — R7303 Prediabetes: Secondary | ICD-10-CM | POA: Diagnosis not present

## 2015-09-03 DIAGNOSIS — L89322 Pressure ulcer of left buttock, stage 2: Secondary | ICD-10-CM | POA: Diagnosis not present

## 2015-09-08 DIAGNOSIS — Z932 Ileostomy status: Secondary | ICD-10-CM | POA: Diagnosis not present

## 2015-09-09 DIAGNOSIS — K5732 Diverticulitis of large intestine without perforation or abscess without bleeding: Secondary | ICD-10-CM | POA: Diagnosis not present

## 2015-09-09 DIAGNOSIS — G8220 Paraplegia, unspecified: Secondary | ICD-10-CM | POA: Diagnosis not present

## 2015-09-09 DIAGNOSIS — R51 Headache: Secondary | ICD-10-CM | POA: Diagnosis not present

## 2015-09-09 DIAGNOSIS — I10 Essential (primary) hypertension: Secondary | ICD-10-CM | POA: Diagnosis not present

## 2015-09-09 DIAGNOSIS — Z982 Presence of cerebrospinal fluid drainage device: Secondary | ICD-10-CM | POA: Diagnosis not present

## 2015-09-09 DIAGNOSIS — Q051 Thoracic spina bifida with hydrocephalus: Secondary | ICD-10-CM | POA: Diagnosis not present

## 2015-09-09 DIAGNOSIS — Z993 Dependence on wheelchair: Secondary | ICD-10-CM | POA: Diagnosis not present

## 2015-09-09 DIAGNOSIS — Z932 Ileostomy status: Secondary | ICD-10-CM | POA: Diagnosis not present

## 2015-09-10 DIAGNOSIS — G8220 Paraplegia, unspecified: Secondary | ICD-10-CM | POA: Diagnosis not present

## 2015-09-10 DIAGNOSIS — N390 Urinary tract infection, site not specified: Secondary | ICD-10-CM | POA: Diagnosis not present

## 2015-09-10 DIAGNOSIS — D649 Anemia, unspecified: Secondary | ICD-10-CM | POA: Diagnosis not present

## 2015-09-10 DIAGNOSIS — Q0703 Arnold-Chiari syndrome with spina bifida and hydrocephalus: Secondary | ICD-10-CM | POA: Diagnosis not present

## 2015-09-10 DIAGNOSIS — K592 Neurogenic bowel, not elsewhere classified: Secondary | ICD-10-CM | POA: Diagnosis not present

## 2015-09-10 DIAGNOSIS — I10 Essential (primary) hypertension: Secondary | ICD-10-CM | POA: Diagnosis not present

## 2015-09-10 DIAGNOSIS — R238 Other skin changes: Secondary | ICD-10-CM | POA: Diagnosis not present

## 2015-09-10 DIAGNOSIS — Z7401 Bed confinement status: Secondary | ICD-10-CM | POA: Diagnosis not present

## 2015-09-10 DIAGNOSIS — L89322 Pressure ulcer of left buttock, stage 2: Secondary | ICD-10-CM | POA: Diagnosis not present

## 2015-09-10 DIAGNOSIS — N319 Neuromuscular dysfunction of bladder, unspecified: Secondary | ICD-10-CM | POA: Diagnosis not present

## 2015-09-10 DIAGNOSIS — N3945 Continuous leakage: Secondary | ICD-10-CM | POA: Diagnosis not present

## 2015-09-10 DIAGNOSIS — Z932 Ileostomy status: Secondary | ICD-10-CM | POA: Diagnosis not present

## 2015-09-10 DIAGNOSIS — R7303 Prediabetes: Secondary | ICD-10-CM | POA: Diagnosis not present

## 2015-09-17 DIAGNOSIS — Z932 Ileostomy status: Secondary | ICD-10-CM | POA: Diagnosis not present

## 2015-09-17 DIAGNOSIS — N390 Urinary tract infection, site not specified: Secondary | ICD-10-CM | POA: Diagnosis not present

## 2015-09-17 DIAGNOSIS — R7303 Prediabetes: Secondary | ICD-10-CM | POA: Diagnosis not present

## 2015-09-17 DIAGNOSIS — K592 Neurogenic bowel, not elsewhere classified: Secondary | ICD-10-CM | POA: Diagnosis not present

## 2015-09-17 DIAGNOSIS — N319 Neuromuscular dysfunction of bladder, unspecified: Secondary | ICD-10-CM | POA: Diagnosis not present

## 2015-09-17 DIAGNOSIS — I10 Essential (primary) hypertension: Secondary | ICD-10-CM | POA: Diagnosis not present

## 2015-09-17 DIAGNOSIS — R238 Other skin changes: Secondary | ICD-10-CM | POA: Diagnosis not present

## 2015-09-17 DIAGNOSIS — Q0703 Arnold-Chiari syndrome with spina bifida and hydrocephalus: Secondary | ICD-10-CM | POA: Diagnosis not present

## 2015-09-17 DIAGNOSIS — Z7401 Bed confinement status: Secondary | ICD-10-CM | POA: Diagnosis not present

## 2015-09-17 DIAGNOSIS — D649 Anemia, unspecified: Secondary | ICD-10-CM | POA: Diagnosis not present

## 2015-09-17 DIAGNOSIS — L89322 Pressure ulcer of left buttock, stage 2: Secondary | ICD-10-CM | POA: Diagnosis not present

## 2015-09-17 DIAGNOSIS — G8220 Paraplegia, unspecified: Secondary | ICD-10-CM | POA: Diagnosis not present

## 2015-09-22 ENCOUNTER — Encounter (HOSPITAL_BASED_OUTPATIENT_CLINIC_OR_DEPARTMENT_OTHER): Payer: Medicare Other | Attending: Internal Medicine

## 2015-09-24 DIAGNOSIS — Z7401 Bed confinement status: Secondary | ICD-10-CM | POA: Diagnosis not present

## 2015-09-24 DIAGNOSIS — I10 Essential (primary) hypertension: Secondary | ICD-10-CM | POA: Diagnosis not present

## 2015-09-24 DIAGNOSIS — R238 Other skin changes: Secondary | ICD-10-CM | POA: Diagnosis not present

## 2015-09-24 DIAGNOSIS — R7303 Prediabetes: Secondary | ICD-10-CM | POA: Diagnosis not present

## 2015-09-24 DIAGNOSIS — L89322 Pressure ulcer of left buttock, stage 2: Secondary | ICD-10-CM | POA: Diagnosis not present

## 2015-09-24 DIAGNOSIS — Q0703 Arnold-Chiari syndrome with spina bifida and hydrocephalus: Secondary | ICD-10-CM | POA: Diagnosis not present

## 2015-09-24 DIAGNOSIS — K592 Neurogenic bowel, not elsewhere classified: Secondary | ICD-10-CM | POA: Diagnosis not present

## 2015-09-24 DIAGNOSIS — D649 Anemia, unspecified: Secondary | ICD-10-CM | POA: Diagnosis not present

## 2015-09-24 DIAGNOSIS — Z932 Ileostomy status: Secondary | ICD-10-CM | POA: Diagnosis not present

## 2015-09-24 DIAGNOSIS — G8220 Paraplegia, unspecified: Secondary | ICD-10-CM | POA: Diagnosis not present

## 2015-09-24 DIAGNOSIS — N319 Neuromuscular dysfunction of bladder, unspecified: Secondary | ICD-10-CM | POA: Diagnosis not present

## 2015-09-24 DIAGNOSIS — N390 Urinary tract infection, site not specified: Secondary | ICD-10-CM | POA: Diagnosis not present

## 2015-09-30 DIAGNOSIS — Z932 Ileostomy status: Secondary | ICD-10-CM | POA: Diagnosis not present

## 2015-09-30 DIAGNOSIS — K5732 Diverticulitis of large intestine without perforation or abscess without bleeding: Secondary | ICD-10-CM | POA: Diagnosis not present

## 2015-10-01 DIAGNOSIS — E871 Hypo-osmolality and hyponatremia: Secondary | ICD-10-CM | POA: Diagnosis not present

## 2015-10-01 DIAGNOSIS — Q054 Unspecified spina bifida with hydrocephalus: Secondary | ICD-10-CM | POA: Diagnosis not present

## 2015-11-03 DIAGNOSIS — G919 Hydrocephalus, unspecified: Secondary | ICD-10-CM | POA: Diagnosis not present

## 2015-11-03 DIAGNOSIS — K5732 Diverticulitis of large intestine without perforation or abscess without bleeding: Secondary | ICD-10-CM | POA: Diagnosis not present

## 2015-11-03 DIAGNOSIS — Q054 Unspecified spina bifida with hydrocephalus: Secondary | ICD-10-CM | POA: Diagnosis not present

## 2015-11-03 DIAGNOSIS — E871 Hypo-osmolality and hyponatremia: Secondary | ICD-10-CM | POA: Diagnosis not present

## 2015-11-03 DIAGNOSIS — Z932 Ileostomy status: Secondary | ICD-10-CM | POA: Diagnosis not present

## 2015-11-06 DIAGNOSIS — K5732 Diverticulitis of large intestine without perforation or abscess without bleeding: Secondary | ICD-10-CM | POA: Diagnosis not present

## 2015-11-06 DIAGNOSIS — Z932 Ileostomy status: Secondary | ICD-10-CM | POA: Diagnosis not present

## 2015-11-19 ENCOUNTER — Encounter (HOSPITAL_COMMUNITY): Payer: Self-pay | Admitting: Cardiology

## 2015-11-19 ENCOUNTER — Inpatient Hospital Stay (HOSPITAL_COMMUNITY)
Admission: EM | Admit: 2015-11-19 | Discharge: 2015-11-23 | DRG: 871 | Disposition: A | Discharge status: Home Health | Payer: Medicare Other | Attending: Internal Medicine | Admitting: Internal Medicine

## 2015-11-19 DIAGNOSIS — Z833 Family history of diabetes mellitus: Secondary | ICD-10-CM | POA: Diagnosis not present

## 2015-11-19 DIAGNOSIS — Z883 Allergy status to other anti-infective agents status: Secondary | ICD-10-CM

## 2015-11-19 DIAGNOSIS — R7303 Prediabetes: Secondary | ICD-10-CM | POA: Diagnosis present

## 2015-11-19 DIAGNOSIS — D649 Anemia, unspecified: Secondary | ICD-10-CM | POA: Diagnosis present

## 2015-11-19 DIAGNOSIS — Q0700 Arnold-Chiari syndrome without spina bifida or hydrocephalus: Secondary | ICD-10-CM

## 2015-11-19 DIAGNOSIS — I10 Essential (primary) hypertension: Secondary | ICD-10-CM | POA: Diagnosis present

## 2015-11-19 DIAGNOSIS — Z91040 Latex allergy status: Secondary | ICD-10-CM

## 2015-11-19 DIAGNOSIS — K592 Neurogenic bowel, not elsewhere classified: Secondary | ICD-10-CM | POA: Diagnosis present

## 2015-11-19 DIAGNOSIS — N390 Urinary tract infection, site not specified: Secondary | ICD-10-CM | POA: Diagnosis present

## 2015-11-19 DIAGNOSIS — Q0703 Arnold-Chiari syndrome with spina bifida and hydrocephalus: Secondary | ICD-10-CM

## 2015-11-19 DIAGNOSIS — Z888 Allergy status to other drugs, medicaments and biological substances status: Secondary | ICD-10-CM

## 2015-11-19 DIAGNOSIS — A419 Sepsis, unspecified organism: Secondary | ICD-10-CM | POA: Diagnosis not present

## 2015-11-19 DIAGNOSIS — Z932 Ileostomy status: Secondary | ICD-10-CM | POA: Diagnosis not present

## 2015-11-19 DIAGNOSIS — K219 Gastro-esophageal reflux disease without esophagitis: Secondary | ICD-10-CM | POA: Diagnosis not present

## 2015-11-19 DIAGNOSIS — R51 Headache: Secondary | ICD-10-CM

## 2015-11-19 DIAGNOSIS — R6520 Severe sepsis without septic shock: Secondary | ICD-10-CM | POA: Diagnosis not present

## 2015-11-19 DIAGNOSIS — N319 Neuromuscular dysfunction of bladder, unspecified: Secondary | ICD-10-CM | POA: Diagnosis not present

## 2015-11-19 DIAGNOSIS — E875 Hyperkalemia: Secondary | ICD-10-CM | POA: Diagnosis present

## 2015-11-19 DIAGNOSIS — E871 Hypo-osmolality and hyponatremia: Secondary | ICD-10-CM | POA: Diagnosis present

## 2015-11-19 DIAGNOSIS — L89303 Pressure ulcer of unspecified buttock, stage 3: Secondary | ICD-10-CM | POA: Diagnosis not present

## 2015-11-19 DIAGNOSIS — G808 Other cerebral palsy: Secondary | ICD-10-CM | POA: Diagnosis present

## 2015-11-19 DIAGNOSIS — Q059 Spina bifida, unspecified: Secondary | ICD-10-CM

## 2015-11-19 DIAGNOSIS — N179 Acute kidney failure, unspecified: Secondary | ICD-10-CM | POA: Diagnosis present

## 2015-11-19 DIAGNOSIS — E860 Dehydration: Secondary | ICD-10-CM | POA: Diagnosis not present

## 2015-11-19 DIAGNOSIS — Z982 Presence of cerebrospinal fluid drainage device: Secondary | ICD-10-CM | POA: Diagnosis not present

## 2015-11-19 DIAGNOSIS — Z87440 Personal history of urinary (tract) infections: Secondary | ICD-10-CM | POA: Diagnosis not present

## 2015-11-19 DIAGNOSIS — Z993 Dependence on wheelchair: Secondary | ICD-10-CM

## 2015-11-19 DIAGNOSIS — B9620 Unspecified Escherichia coli [E. coli] as the cause of diseases classified elsewhere: Secondary | ICD-10-CM | POA: Diagnosis present

## 2015-11-19 DIAGNOSIS — R519 Headache, unspecified: Secondary | ICD-10-CM | POA: Diagnosis present

## 2015-11-19 LAB — URINALYSIS, ROUTINE W REFLEX MICROSCOPIC
Bilirubin Urine: NEGATIVE
Glucose, UA: NEGATIVE mg/dL
KETONES UR: NEGATIVE mg/dL
NITRITE: NEGATIVE
PH: 5 (ref 5.0–8.0)
Protein, ur: 30 mg/dL — AB
SPECIFIC GRAVITY, URINE: 1.016 (ref 1.005–1.030)

## 2015-11-19 LAB — COMPREHENSIVE METABOLIC PANEL
ALT: 54 U/L (ref 17–63)
ANION GAP: 22 — AB (ref 5–15)
AST: 24 U/L (ref 15–41)
Albumin: 4.3 g/dL (ref 3.5–5.0)
Alkaline Phosphatase: 132 U/L — ABNORMAL HIGH (ref 38–126)
BILIRUBIN TOTAL: 0.5 mg/dL (ref 0.3–1.2)
BUN: 110 mg/dL — ABNORMAL HIGH (ref 6–20)
CO2: 17 mmol/L — ABNORMAL LOW (ref 22–32)
Calcium: 9.8 mg/dL (ref 8.9–10.3)
Chloride: 86 mmol/L — ABNORMAL LOW (ref 101–111)
Creatinine, Ser: 4.28 mg/dL — ABNORMAL HIGH (ref 0.61–1.24)
GFR calc Af Amer: 20 mL/min — ABNORMAL LOW (ref >60)
GFR, EST NON AFRICAN AMERICAN: 17 mL/min — AB (ref >60)
Glucose, Bld: 142 mg/dL — ABNORMAL HIGH (ref 65–99)
POTASSIUM: 5.6 mmol/L — AB (ref 3.5–5.1)
Sodium: 125 mmol/L — ABNORMAL LOW (ref 135–145)
TOTAL PROTEIN: 9.2 g/dL — AB (ref 6.5–8.1)

## 2015-11-19 LAB — CBC
HEMATOCRIT: 43.3 % (ref 39.0–52.0)
HEMOGLOBIN: 15 g/dL (ref 13.0–17.0)
MCH: 24.4 pg — ABNORMAL LOW (ref 26.0–34.0)
MCHC: 34.6 g/dL (ref 30.0–36.0)
MCV: 70.4 fL — ABNORMAL LOW (ref 78.0–100.0)
Platelets: 458 10*3/uL — ABNORMAL HIGH (ref 150–400)
RBC: 6.15 MIL/uL — AB (ref 4.22–5.81)
RDW: 15.3 % (ref 11.5–15.5)
WBC: 18 10*3/uL — AB (ref 4.0–10.5)

## 2015-11-19 LAB — URINE MICROSCOPIC-ADD ON

## 2015-11-19 MED ORDER — DEXTROSE 5 % IV SOLN
1.0000 g | Freq: Once | INTRAVENOUS | Status: AC
  Administered 2015-11-19: 1 g via INTRAVENOUS
  Filled 2015-11-19: qty 10

## 2015-11-19 MED ORDER — SODIUM CHLORIDE 0.9 % IV BOLUS (SEPSIS)
1000.0000 mL | Freq: Once | INTRAVENOUS | Status: AC
  Administered 2015-11-19: 1000 mL via INTRAVENOUS

## 2015-11-19 MED ORDER — LEVOFLOXACIN IN D5W 500 MG/100ML IV SOLN: 500.0000 mg | INTRAVENOUS | Status: DC

## 2015-11-19 MED ORDER — LEVOFLOXACIN IN D5W 750 MG/150ML IV SOLN
750.0000 mg | Freq: Once | INTRAVENOUS | Status: AC
  Administered 2015-11-19: 750 mg via INTRAVENOUS
  Filled 2015-11-19: qty 150

## 2015-11-19 NOTE — H&P (Signed)
[facility] History and Physical  [name] [id number] DOB: 1981/07/30 DOA: 11/19/2015  Referring physician: Ripley Fraise, MD PCP: Elyn Peers, MD   Chief Complaint: Urinary Tract Infection  HPI: [name] is a 35 y.o. male with a history of recurring UTIs, most recently due to Escherichia coli and enterococcus, with below past medical history who is brought by his mother with symptoms of UTI.  Per patient's mother, she noticed that his urine was having a foul smell on Monday. Next day, the patient had decreased oral intake, mild nausea and fatigue. So she called his PCP so they could send someone from home health to collect a urine analysis. However, she is states the message did not get relayed to the nursing staff, PCP and they did not know until late in the work day when she called again. On Wednesday, nobody came to collect a urinalysis.   Today, Thursday, she noticed that the patient's symptoms were getting worse so she decided to come to the emergency department. Workup is significant for leukocytosis, elevated BUN/creatinine and leukocyturia.  Review of Systems:  History was provided by the patient's mother.  Past Medical History  Diagnosis Date  . Hypertension   . GERD (gastroesophageal reflux disease)   . Non-healing surgical wound     ABDOMINE  . 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 Arnold-Chiari malformation ([place])   . Neurogenic bladder     INCONTINENT -  . 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 Ileostomy in place [facility])   . Wheelchair bound     CAN TRASFER SELF WITHOUT BOARD  . Urinary incontinence with continuous leakage   . Wears glasses   . Pressure ulcer, buttock   . Borderline diabetes     DIET CONTROLLED  . Heart palpitations     SECONDARY TO VP SHUNT DRAIN  . History of seizure as newborn     NO MEDS SINCE 35 YRS  OLD--  NO ISSUES SINCE  . Neurogenic bowel   . At risk for sleep apnea     STOP-BANG = 4   SENT TO PCP 11-01-2013   Past Surgical History  Procedure Laterality Date  . Ventriculoperitoneal shunt  LAST REVISION NOV 2014    DUE TO ABD WOUND--- SHUNT DRAINS TO HEART  . Hip surgery Bilateral AS CHILD    TENDON RELEASE  . Cholecystectomy N/A 07/06/2013    Procedure: LAPAROSCOPIC CHOLECYSTECTOMY WITH INTRAOPERATIVE CHOLANGIOGRAM;  Surgeon: Madilyn Hook, DO;  Location: [facility];  Service: General;  Laterality: N/A;  . Colon resection N/A 07/08/2013    Procedure: EXPLORATORY LAPAROSCOPY, DIAGNOSTIC LAPAROSCOPY, PARTIAL COLECTOMY, ABDOMINAL WASHOUT, ABDOMINAL WOUND VAC;  Surgeon: Madilyn Hook, DO;  Location: [facility];  Service: General;  Laterality: N/A;  . Spinal fixation surgery w/ implant  AS CHILD    H[place] RODS  . 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 Umbilical hernia repair  03-30-2007  . Right colectomy/  appendectomy/  ileostomy  NOV 2014  [facility]  . Incision and drainage of wound N/A 11/04/2013    Procedure: IRRIGATION AND DEBRIDEMENT ABDOMINAL WOUND WITH PLACEMENT OF ACELL/VAC;  Surgeon: Theodoro Kos, DO;  Location: [place];  Service: Plastics;  Laterality: N/A;   Social History:  reports that he has never smoked. He has never used smokeless tobacco. He reports that  he does not drink alcohol or use illicit drugs.  Allergies  Allergen Reactions  . Latex Hives  . Succinylcholine Rash    Other reaction(s): Other (See Comments) Pt is half [place] - prolonged paralysis due to compromised metabolism. Succinylcholine given on 07/07/15 at outside hospital and pt experienced paralysis > 3 hours.  Due to being native [place], his mother states that he takes longer to "come out of it"  . Ciprofloxacin Other (See Comments)    IV only-- caused burning in arm   . Vancomycin Itching and Swelling  . Adhesive [Tape] Hives and Rash    Family History    Problem Relation Age of Onset  . Diabetes Other   . Hypertension Other   . Cancer Other   . Stroke Other     Prior to Admission medications   Medication Sig Start Date End Date Taking? Authorizing Provider  acetaminophen (TYLENOL) 650 MG CR tablet Take 1,300 mg by mouth every 8 (eight) hours as needed for pain.   Yes Historical Provider, MD  Cholecalciferol (VITAMIN D3) 2000 units capsule Take 2,000 Units by mouth daily.   Yes Historical Provider, MD  collagenase (SANTYL) ointment Apply 1 application topically daily as needed (for wound care).  05/13/15  Yes Historical Provider, MD  Cranberry 400 MG CAPS Take 4,200 mg by mouth every morning.    Yes Historical Provider, MD  metoprolol (LOPRESSOR) 50 MG tablet Take 50 mg by mouth 2 (two) times daily. 10/31/15  Yes Historical Provider, MD  Multiple Vitamin (MULTIVITAMIN) tablet Take 1 tablet by mouth daily.   Yes Historical Provider, MD  ondansetron (ZOFRAN) 4 MG tablet Take 4 mg by mouth every 8 (eight) hours as needed for nausea or vomiting.   Yes Historical Provider, MD  oxyCODONE (OXY IR/ROXICODONE) 5 MG immediate release tablet Take 1 tablet (5 mg total) by mouth every 6 (six) hours as needed. 1 by mouth every 3 hours as needed for MILD pain, take 2 by mouth every 3 hours as needed for MODERATE pain, 3 by mouth every 3 hours as needed for SEVERE pain 10/30/14  Yes Reyne Dumas, MD  potassium chloride 20 MEQ TBCR Take 20 mEq by mouth daily. Patient taking differently: Take 20 mEq by mouth 2 (two) times daily.  10/30/14  Yes Reyne Dumas, MD  verapamil (CALAN-SR) 120 MG CR tablet Take 120 mg by mouth at bedtime. 11/08/15  Yes Historical Provider, MD   Physical Exam: Filed Vitals:   11/19/15 2130 11/19/15 2145 11/19/15 2200 11/19/15 2215  BP: 95/67 85/72 98/60  94/50  Pulse: 77 89 79 82  Temp:      TempSrc:      Resp: 19 20 18 15   SpO2: 100% 100% 99% 98%    Wt Readings from Last 3 Encounters:  07/22/15 67.1 kg (147 lb 14.9 oz)  07/07/15  72.576 kg (160 lb)  11/24/14 75.297 kg (166 lb)    General:  Appears Sleepy, but in no acute distress. Eyes: PERRL, normal lids, irises & conjunctiva ENT: grossly normal hearing, lips & tongue. Oral mucosa is mildly dry. Neck: no LAD, masses or thyromegaly Cardiovascular: RRR, no m/r/g. No LE edema. Telemetry: SR, no arrhythmias  Respiratory: Decreased respiratory effort, otherwise CTA bilaterally. Abdomen: Mildly distended, multiple surgical scars, positive ileostomy, BS+, soft, nontender. Skin: Positive a stage III buttock pressure ulcer. Musculoskeletal: General decrease in muscle tone, more pronounced on lower extremities. Psychiatric: Patient was lethargic, but able to answer simple questions. Neurologic: Congenital paraplegia.  Labs on Admission:  Basic Metabolic Panel:  Recent Labs Lab 11/19/15 1148  NA 125*  K 5.6*  CL 86*  CO2 17*  GLUCOSE 142*  BUN 110*  CREATININE 4.28*  CALCIUM 9.8   Liver Function Tests:  Recent Labs Lab 11/19/15 1148  AST 24  ALT 54  ALKPHOS 132*  BILITOT 0.5  PROT 9.2*  ALBUMIN 4.3   CBC:  Recent Labs Lab 11/19/15 1148  WBC 18.0*  HGB 15.0  HCT 43.3  MCV 70.4*  PLT 458*     EKG: Independently reviewed.  Vent. rate 82 BPM PR interval 143 ms QRS duration 85 ms QT/QTc 346/404 ms P-R-T axes 62 90 76 Sinus rhythm Borderline right axis deviation  Assessment/Plan Principal Problem:   Sepsis secondary to UTI ([place]) Admit to a stepdown/inpatient Continue IV hydration. Continue ceftriaxone to cover for Escherichia coli. Start Levaquin 500 mg IVP daily to cover enterococcus. Follow-up cultures and sensitivities.  Active Problems:   AKI (acute kidney injury) ([place]) Prerenal BUN/creatinine ratio. Continue IV hydration with normal saline. Monitor input and output. Follow-up BUN/creatinine and electrolytes in a.m. Consider expanding workup or consulting nephrology if no improvement.    Esophageal  reflux Continue PPI.    Essential hypertension, benign Continue metoprolol and verapamil. Monitor blood pressure periodically.    Headache Continue calcium channel and beta blockers for headache prevention.      Hyperkalemia/Hyponatremia Continue hydration with IV fluids. Follow-up potassium and sodium and renal function in the morning.    Borderline diabetes Carbohydrate modified diet. CBG monitoring 3 times a day before meals.    Neurogenic bladder   Arnold-Chiari malformation ([place])   Spina bifida ([place]) Continue supportive care.     Decubitus ulcer of buttock, stage 3 (HCC) Continue local care.     Code Status: Full code. DVT Prophylaxis:Lovenox SQ. Family Communication: His mother was present in the room and provided history. Disposition Plan: Admit to a stepdown for close monitoring, to continue IV antibiotic therapy and rehydration.  Time spent: Over 70 minutes were spent in the process of this admission.  Reubin Milan, M.D. [facility] Pager [phone].

## 2015-11-19 NOTE — ED Provider Notes (Signed)
CSN: [id number]     Arrival date & time 11/19/15  1049 History   First MD Initiated Contact with Patient 11/19/15 1828     Chief Complaint  Patient presents with  . Urinary Tract Infection    Patient is a 35 y.o. male presenting with urinary tract infection. The history is provided by the patient and a relative.  Urinary Tract Infection This is a new problem. Episode onset: unknown. The problem occurs constantly. The problem has been gradually worsening. Associated symptoms include abdominal pain. Nothing aggravates the symptoms. Nothing relieves the symptoms. He has tried nothing for the symptoms.  patient presents from home with mother He has h/o spina bifida He has an ileostomy He has frequent UTI Over the past several days, mother reports he has had foul smelling urine No fever/vomiting He does report abdominal pain No other complaints  Past Medical History  Diagnosis Date  . Hypertension   . GERD (gastroesophageal reflux disease)   . Non-healing surgical wound     ABDOMINE  . 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 Arnold-Chiari malformation ([place])   . Neurogenic bladder     INCONTINENT -  . 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 Ileostomy in place [facility])   . Wheelchair bound     CAN TRASFER SELF WITHOUT BOARD  . Urinary incontinence with continuous leakage   . Wears glasses   . Pressure ulcer, buttock   . Borderline diabetes     DIET CONTROLLED  . Heart palpitations     SECONDARY TO VP SHUNT DRAIN  . History of seizure as newborn     NO MEDS SINCE 35 YRS OLD--  NO ISSUES SINCE  . Neurogenic bowel   . At risk for sleep apnea     STOP-BANG = 4   SENT TO PCP 11-01-2013   Past Surgical History  Procedure Laterality Date  . Ventriculoperitoneal shunt  LAST REVISION NOV 2014    DUE TO ABD WOUND--- SHUNT DRAINS TO HEART  . Hip surgery Bilateral AS CHILD   TENDON RELEASE  . Cholecystectomy N/A 07/06/2013    Procedure: LAPAROSCOPIC CHOLECYSTECTOMY WITH INTRAOPERATIVE CHOLANGIOGRAM;  Surgeon: Madilyn Hook, DO;  [facility];  Service: General;  Laterality: N/A;  . Colon resection N/A 07/08/2013    Procedure: EXPLORATORY LAPAROSCOPY, DIAGNOSTIC LAPAROSCOPY, PARTIAL COLECTOMY, ABDOMINAL WASHOUT, ABDOMINAL WOUND VAC;  Surgeon: Madilyn Hook, DO;  [facility];  Service: General;  Laterality: N/A;  . Spinal fixation surgery w/ implant  AS CHILD    H[place] RODS  . 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 Umbilical hernia repair  03-30-2007  . Right colectomy/  appendectomy/  ileostomy  NOV 2014  [facility]  . Incision and drainage of wound N/A 11/04/2013    Procedure: IRRIGATION AND DEBRIDEMENT ABDOMINAL WOUND WITH PLACEMENT OF ACELL/VAC;  Surgeon: Theodoro Kos, DO;  Location: [place];  Service: Plastics;  Laterality: N/A;   Family History  Problem Relation Age of Onset  . Diabetes Other   . Hypertension Other   . Cancer Other   . Stroke Other    Social History  Substance Use Topics  . Smoking status: Never Smoker   . Smokeless tobacco: Never Used  . Alcohol Use: No    Review of Systems  Constitutional: Positive for fatigue. Negative for fever.  Gastrointestinal:  Positive for abdominal pain.  Genitourinary:       Foul smelling urine   All other systems reviewed and are negative.     Allergies  Latex; Adhesive; Ciprofloxacin; Vancomycin; and Succinylcholine  Home Medications   Prior to Admission medications   Medication Sig Start Date End Date Taking? Authorizing Provider  acetaminophen (TYLENOL) 650 MG CR tablet Take 1,300 mg by mouth every 8 (eight) hours as needed for pain.    Historical Provider, MD  Cranberry 400 MG CAPS Take 400 mg by mouth daily.    Historical Provider, MD  Multiple Vitamin (MULTIVITAMIN) tablet Take 1 tablet by mouth daily.    Historical Provider, MD  ondansetron  (ZOFRAN) 4 MG tablet Take 4 mg by mouth every 8 (eight) hours as needed for nausea or vomiting.    Historical Provider, MD  oxyCODONE (OXY IR/ROXICODONE) 5 MG immediate release tablet Take 1 tablet (5 mg total) by mouth every 6 (six) hours as needed. 1 by mouth every 3 hours as needed for MILD pain, take 2 by mouth every 3 hours as needed for MODERATE pain, 3 by mouth every 3 hours as needed for SEVERE pain 10/30/14   Reyne Dumas, MD  potassium chloride 20 MEQ TBCR Take 20 mEq by mouth daily. Patient taking differently: Take 20 mEq by mouth 2 (two) times daily.  10/30/14   Reyne Dumas, MD  Wound Dressings (PROMOGRAN EX) Apply 1 application topically daily as needed (bedsores). Tear off 1 piece of promogran, apply normal saline to promogran,then adhere to skin and cover    Historical Provider, MD   BP 113/55 mmHg  Pulse 93  Temp(Src) 97.9 F (36.6 C) (Oral)  Resp 18  SpO2 99% Physical Exam CONSTITUTIONAL: Chronically ill appearing HEAD: Normocephalic/atraumatic EYES: EOMI ENMT: Mucous membranes dry NECK: supple no meningeal signs CV: S1/S2 noted LUNGS: Lungs are clear to auscultation bilaterally ABDOMEN: soft, nontender, ostomy site noted, brown/yellow stool noted, no blood stool noted [id number] appearance, no erythema/edema to penis, mother present for exam NEURO: Pt is awake/alert/appropriate, no distress noted  EXTREMITIES: lower extremity contractures noted SKIN: warm, color normal, wound noted to buttocks PSYCH: no abnormalities of mood noted, alert and oriented to situation  ED Course  Procedures  10:23 PM Pt found to have UTI, dehydration, acute renal failure Pt is chronically ill at baseline, has ostomy in place and requires care from family He will need to be admitted for IV Fluids, and also IV antibiotics for his UTI Pt stabilized in the ER D/w dr Olevia Bowens with [facility] to admit  Labs Review Labs Reviewed  COMPREHENSIVE METABOLIC PANEL - Abnormal; Notable for the  following:    Sodium 125 (*)    Potassium 5.6 (*)    Chloride 86 (*)    CO2 17 (*)    Glucose, Bld 142 (*)    BUN 110 (*)    Creatinine, Ser 4.28 (*)    Total Protein 9.2 (*)    Alkaline Phosphatase 132 (*)    GFR calc non Af Amer 17 (*)    GFR calc Af Amer 20 (*)    Anion gap 22 (*)    All other components within normal limits  CBC - Abnormal; Notable for the following:    WBC 18.0 (*)    RBC 6.15 (*)    MCV 70.4 (*)    MCH 24.4 (*)    Platelets 458 (*)    All other components within normal limits  URINALYSIS, ROUTINE W REFLEX  MICROSCOPIC (NOT AT [facility]) - Abnormal; Notable for the following:    APPearance TURBID (*)    Hgb urine dipstick MODERATE (*)    Protein, ur 30 (*)    Leukocytes, UA LARGE (*)    All other components within normal limits  URINE MICROSCOPIC-ADD ON - Abnormal; Notable for the following:    Squamous Epithelial / LPF 6-30 (*)    Bacteria, UA MANY (*)    All other components within normal limits  URINE CULTURE   I have personally reviewed and evaluated these lab results as part of my medical decision-making.  Medications  sodium chloride 0.9 % bolus 1,000 mL (1,000 mLs Intravenous New Bag/Given 11/19/15 2217)  sodium chloride 0.9 % bolus 1,000 mL (0 mLs Intravenous Stopped 11/19/15 2215)  cefTRIAXone (ROCEPHIN) 1 g in dextrose 5 % 50 mL IVPB (0 g Intravenous Stopped 11/19/15 2215)     MDM   Final diagnoses:  Complicated UTI (urinary tract infection)  Dehydration  AKI (acute kidney injury) ([place])  Hyperkalemia    Nursing notes including past medical history and social history reviewed and considered in documentation Labs/vital reviewed myself and considered during evaluation     Ripley Fraise, MD 11/19/15 2224

## 2015-11-19 NOTE — Progress Notes (Signed)
Pharmacy Antibiotic Note  [name] is a 35 y.o. male admitted on 11/19/2015 with UTI.  Pt w/ spina bifida and neurogenic bladder. Pharmacy has been consulted for Levaquin dosing. WBC 18, afebrile, SCr 4.28    Plan: Levaquin 750mg  x1 in the ED Levaquin 500mg  IV Q48h  F/U renal fxn, c/s, LOT     Temp (24hrs), Avg:98.1 F (36.7 C), Min:97.5 F (36.4 C), Max:98.8 F (37.1 C)   Recent Labs Lab 11/19/15 1148  WBC 18.0*  CREATININE 4.28*    CrCl cannot be calculated (Unknown ideal weight.).    Allergies  Allergen Reactions  . Latex Hives  . Succinylcholine Rash    Other reaction(s): Other (See Comments) Pt is half [place] - prolonged paralysis due to compromised metabolism. Succinylcholine given on 07/07/15 at outside hospital and pt experienced paralysis > 3 hours.  Due to being native [place], his mother states that he takes longer to "come out of it"  . Ciprofloxacin Other (See Comments)    IV only-- caused burning in arm   . Vancomycin Itching and Swelling  . Adhesive [Tape] Hives and Rash    Antimicrobials this admission: 3/9 Levaquin>>  Thank you for allowing pharmacy to be a part of this patient's care.  [name] C. Lennox Grumbles, PharmD Pharmacy Resident  Pager: [phone] 11/19/2015 10:44 PM

## 2015-11-19 NOTE — ED Notes (Addendum)
Pt has indwelling catheter.  Will need to be laid down in order to obtain sample.  Collect in room please.

## 2015-11-19 NOTE — ED Notes (Signed)
Pt reports possible UTI symptoms that started yesterday. Also reports lower abd pain. Pt has a chronic foley cath, and family reports smell.

## 2015-11-20 ENCOUNTER — Inpatient Hospital Stay (HOSPITAL_COMMUNITY): Payer: Medicare Other

## 2015-11-20 DIAGNOSIS — L89303 Pressure ulcer of unspecified buttock, stage 3: Secondary | ICD-10-CM

## 2015-11-20 DIAGNOSIS — N390 Urinary tract infection, site not specified: Secondary | ICD-10-CM | POA: Insufficient documentation

## 2015-11-20 LAB — CBC WITH DIFFERENTIAL/PLATELET
BASOS ABS: 0 10*3/uL (ref 0.0–0.1)
BASOS PCT: 0 %
Eosinophils Absolute: 0 10*3/uL (ref 0.0–0.7)
Eosinophils Relative: 0 %
HCT: 37.4 % — ABNORMAL LOW (ref 39.0–52.0)
Hemoglobin: 12.1 g/dL — ABNORMAL LOW (ref 13.0–17.0)
LYMPHS PCT: 13 %
Lymphs Abs: 1.6 10*3/uL (ref 0.7–4.0)
MCH: 23 pg — AB (ref 26.0–34.0)
MCHC: 32.4 g/dL (ref 30.0–36.0)
MCV: 71.2 fL — AB (ref 78.0–100.0)
MONOS PCT: 14 %
Monocytes Absolute: 1.7 10*3/uL — ABNORMAL HIGH (ref 0.1–1.0)
NEUTROS ABS: 9.1 10*3/uL — AB (ref 1.7–7.7)
NEUTROS PCT: 73 %
PLATELETS: 338 10*3/uL (ref 150–400)
RBC: 5.25 MIL/uL (ref 4.22–5.81)
RDW: 15.3 % (ref 11.5–15.5)
WBC: 12.5 10*3/uL — ABNORMAL HIGH (ref 4.0–10.5)

## 2015-11-20 LAB — COMPREHENSIVE METABOLIC PANEL
ALT: 36 U/L (ref 17–63)
ANION GAP: 11 (ref 5–15)
AST: 19 U/L (ref 15–41)
Albumin: 3.3 g/dL — ABNORMAL LOW (ref 3.5–5.0)
Alkaline Phosphatase: 100 U/L (ref 38–126)
BUN: 89 mg/dL — ABNORMAL HIGH (ref 6–20)
CHLORIDE: 106 mmol/L (ref 101–111)
CO2: 15 mmol/L — AB (ref 22–32)
CREATININE: 2.46 mg/dL — AB (ref 0.61–1.24)
Calcium: 8.8 mg/dL — ABNORMAL LOW (ref 8.9–10.3)
GFR, EST AFRICAN AMERICAN: 38 mL/min — AB (ref >60)
GFR, EST NON AFRICAN AMERICAN: 33 mL/min — AB (ref >60)
Glucose, Bld: 100 mg/dL — ABNORMAL HIGH (ref 65–99)
POTASSIUM: 4.6 mmol/L (ref 3.5–5.1)
SODIUM: 132 mmol/L — AB (ref 135–145)
Total Bilirubin: 0.5 mg/dL (ref 0.3–1.2)
Total Protein: 7.6 g/dL (ref 6.5–8.1)

## 2015-11-20 LAB — GLUCOSE, CAPILLARY
GLUCOSE-CAPILLARY: 89 mg/dL (ref 65–99)
GLUCOSE-CAPILLARY: 155 mg/dL — AB (ref 65–99)
Glucose-Capillary: 93 mg/dL (ref 65–99)

## 2015-11-20 LAB — MAGNESIUM: Magnesium: 1.9 mg/dL (ref 1.7–2.4)

## 2015-11-20 LAB — PHOSPHORUS: Phosphorus: 4.5 mg/dL (ref 2.5–4.6)

## 2015-11-20 LAB — MRSA PCR SCREENING: MRSA by PCR: NEGATIVE

## 2015-11-20 LAB — PROCALCITONIN: PROCALCITONIN: 0.25 ng/mL

## 2015-11-20 MED ORDER — ENOXAPARIN SODIUM 30 MG/0.3ML ~~LOC~~ SOLN
30.0000 mg | SUBCUTANEOUS | Status: DC
  Administered 2015-11-20 – 2015-11-21 (×2): 30 mg via SUBCUTANEOUS
  Filled 2015-11-20 (×2): qty 0.3

## 2015-11-20 MED ORDER — VITAMIN D 1000 UNITS PO TABS
2000.0000 [IU] | ORAL_TABLET | Freq: Every day | ORAL | Status: DC
  Administered 2015-11-20 – 2015-11-23 (×4): 2000 [IU] via ORAL
  Filled 2015-11-20 (×5): qty 2

## 2015-11-20 MED ORDER — CRANBERRY 400 MG PO CAPS: 4200.0000 mg | ORAL_CAPSULE | ORAL | Status: DC

## 2015-11-20 MED ORDER — ADULT MULTIVITAMIN W/MINERALS CH
1.0000 | ORAL_TABLET | Freq: Every day | ORAL | Status: DC
  Administered 2015-11-20 – 2015-11-23 (×4): 1 via ORAL
  Filled 2015-11-20 (×4): qty 1

## 2015-11-20 MED ORDER — ONE-DAILY MULTI VITAMINS PO TABS: 1.0000 | ORAL_TABLET | Freq: Every day | ORAL | Status: DC

## 2015-11-20 MED ORDER — COLLAGENASE 250 UNIT/GM EX OINT
1.0000 | TOPICAL_OINTMENT | Freq: Every day | CUTANEOUS | Status: DC | PRN
Start: 2015-11-20 — End: 2015-11-20

## 2015-11-20 MED ORDER — ONDANSETRON HCL 4 MG PO TABS: 4.0000 mg | ORAL_TABLET | Freq: Four times a day (QID) | ORAL | Status: DC | PRN

## 2015-11-20 MED ORDER — DEXTROSE 5 % IV SOLN: 1.0000 g | INTRAVENOUS | Status: DC

## 2015-11-20 MED ORDER — LEVOFLOXACIN IN D5W 250 MG/50ML IV SOLN
250.0000 mg | INTRAVENOUS | Status: DC
  Administered 2015-11-20 – 2015-11-21 (×2): 250 mg via INTRAVENOUS
  Filled 2015-11-20 (×4): qty 50

## 2015-11-20 MED ORDER — VERAPAMIL HCL ER 120 MG PO TBCR
120.0000 mg | EXTENDED_RELEASE_TABLET | Freq: Every day | ORAL | Status: DC
  Administered 2015-11-20 – 2015-11-22 (×3): 120 mg via ORAL
  Filled 2015-11-20 (×2): qty 1
  Filled 2015-11-20: qty 0.5
  Filled 2015-11-20: qty 1

## 2015-11-20 MED ORDER — ACETAMINOPHEN ER 650 MG PO TBCR: 650.0000 mg | EXTENDED_RELEASE_TABLET | Freq: Four times a day (QID) | ORAL | Status: DC | PRN

## 2015-11-20 MED ORDER — ONDANSETRON HCL 4 MG/2ML IJ SOLN: 4.0000 mg | Freq: Four times a day (QID) | INTRAMUSCULAR | Status: DC | PRN

## 2015-11-20 MED ORDER — OXYCODONE HCL 5 MG PO TABS: 5.0000 mg | ORAL_TABLET | Freq: Four times a day (QID) | ORAL | Status: DC | PRN

## 2015-11-20 MED ORDER — ACETAMINOPHEN 325 MG PO TABS: 650.0000 mg | ORAL_TABLET | Freq: Four times a day (QID) | ORAL | Status: DC | PRN

## 2015-11-20 MED ORDER — METOPROLOL TARTRATE 25 MG PO TABS
25.0000 mg | ORAL_TABLET | Freq: Two times a day (BID) | ORAL | Status: DC
  Administered 2015-11-20 – 2015-11-23 (×7): 25 mg via ORAL
  Filled 2015-11-20 (×8): qty 1

## 2015-11-20 MED ORDER — SODIUM CHLORIDE 0.9 % IV SOLN
INTRAVENOUS | Status: DC
  Administered 2015-11-20: 04:00:00 via INTRAVENOUS
  Administered 2015-11-21: 100 mL/h via INTRAVENOUS
  Administered 2015-11-21: 1000 mL via INTRAVENOUS

## 2015-11-20 NOTE — Clinical Documentation Improvement (Signed)
Internal Medicine  A cause and effect relationship may not be assumed and must be documented by a provider.  Please clarify the relationship, if any, between Chronic Indwelling Foley and UTI with sepsis.  Are the conditions:   Due to or associated with each other  Unrelated to each other  Other  Clinically Undetermined Please update your documentation within the medical record to reflect your response to this query. Thank you.  Supporting Information (As per notes): "Pt has a chronic foley cath, and family reports smell"  Please exercise your independent, professional judgment when responding. A specific answer is not anticipated or expected.  Thank You, Alessandra Grout, RN, BSN, CCDS,Clinical Documentation Specialist:  [phone]  [phone]=Cell - Health Information Management

## 2015-11-20 NOTE — Progress Notes (Signed)
Utilization review completed. Amariana Mirando, RN, BSN. 

## 2015-11-20 NOTE — Progress Notes (Signed)
[facility] PROGRESS NOTE  [name] [id number] DOB: 07-06-1981 DOA: 11/19/2015 PCP: Zachary Peers, MD  Length of stay: 1   Assessment/Plan: Principal Problem:   Sepsis secondary to UTI [facility]) Active Problems:   Spina bifida ([name])   Neurogenic bladder   Esophageal reflux   Essential hypertension, benign   UTI (lower urinary tract infection)   Headache   AKI (acute kidney injury) ([name])   Hyperkalemia   Hyponatremia   Borderline diabetes   Arnold-Chiari malformation (HCC)   Decubitus ulcer of buttock, stage 3 ([name])   Complicated UTI (urinary tract infection)   Brief summary [name] is a 35 y.o. male with a history of Spina Bifida with Paraplegia, HTN Borderline DM2, Neurogenic Bladder, and Chiari Malformation with recent Replacement of VP Shunt on 07/10/2015 at [facility] who presents to the ED with foul-smelling urine. Found to have a urinary tract infection and acute kidney injury.   Assessment and plan Sepsis/UTI - Patient presents with severe sepsis including   leukocytosis and hypotension. - This is most likely related to UTI, started on Rocephin and ceftriaxone, previous urine culture showing enterococcus /Escherichia coli species, should be covered by Levaquin, Continue Levaquin and DC Rocephin - Physiology of sepsis is resolved, will hold metoprolol on discharge giving soft blood pressure. - Blood cultures remain negative at day of discharge   Acute renal failure - due to volume depletion. - Avoid nephrotoxic medication, treated with IV fluids. - No evidence of urinary retention on straight cath Baseline creatinine around 0.5  Recent history of VP shunt replacement - Was done at [facility], recent CT head with no evidence of hydrocephalus, shows normal size ventricles, patient denies any headache, blurry vision or vomiting.  Hypokalemia - Repleted  Hyponatremia - Due to volume depletion, resolved  Borderline diabetes   Spina  bifida with paraplegia - Congenital, continue supportive care  Anemia - At baseline, patient with drop in hemoglobin during hospital stay 15>12.1 , and this is most likely dilutional, baseline hemoglobin at [facility] on 07/13/2015 was 7.9,     DVT prophylaxsis Lovenox  Code Status:      Code Status Orders        Start     Ordered   11/20/15 0340  Full code   Continuous     11/20/15 0339       Family Communication: Discussed in detail with the patient and mother, all imaging results, lab results explained to the patient   Disposition Plan:  Transfer to telemetry tomorrow     Consultants:  None  Procedures:  None  Antibiotics: Anti-infectives    Start     Dose/Rate Route Frequency Ordered Stop   11/21/15 2245  levofloxacin (LEVAQUIN) IVPB 500 mg     500 mg 100 mL/hr over 60 Minutes Intravenous Every 48 hours 11/19/15 2233     11/20/15 2100  cefTRIAXone (ROCEPHIN) 1 g in dextrose 5 % 50 mL IVPB  Status:  Discontinued     1 g 100 mL/hr over 30 Minutes Intravenous Every 24 hours 11/20/15 0339 11/20/15 0755   11/19/15 2245  levofloxacin (LEVAQUIN) IVPB 750 mg     750 mg 100 mL/hr over 90 Minutes Intravenous  Once 11/19/15 2233 11/20/15 0050   11/19/15 2145  cefTRIAXone (ROCEPHIN) 1 g in dextrose 5 % 50 mL IVPB     1 g 100 mL/hr over 30 Minutes Intravenous  Once 11/19/15 2138 11/19/15 2215  HPI/Subjective: Mother is by the bedside and states that the patient has traumatically improved overnight,  Objective: Filed Vitals:   11/20/15 0400 11/20/15 0500 11/20/15 0600 11/20/15 0700  BP: 127/73 94/49 110/58   Pulse: 95 77 79   Temp: 97.7 F (36.5 C)   97.6 F (36.4 C)  TempSrc: Oral   Oral  Resp: 17 19 14    Height: 5' (1.524 m)     Weight: 65.9 kg (145 lb 4.5 oz)     SpO2: 99% 98% 100%     Intake/Output Summary (Last 24 hours) at 11/20/15 1030 Last data filed at 11/20/15 0403  Gross per 24 hour  Intake      0 ml  Output    930 ml  Net   -930  ml    Exam:  CONSTITUTIONAL: Chronically ill appearing HEAD: Normocephalic/atraumatic EYES: EOMI ENMT: Mucous membranes dry NECK: supple no meningeal signs CV: S1/S2 noted LUNGS: Lungs are clear to auscultation bilaterally ABDOMEN: soft, nontender, ostomy site noted, brown/yellow stool noted, no blood stool noted [id number] appearance, no erythema/edema to penis, mother present for exam NEURO: Pt is awake/alert/appropriate, no distress noted  EXTREMITIES: lower extremity contractures noted SKIN: warm, color normal, wound noted to buttocks PSYCH: no abnormalities of mood noted, alert and oriented to situation Data Review   Micro Results Recent Results (from the past 240 hour(s))  MRSA PCR Screening     Status: None   Collection Time: 11/20/15  3:39 AM  Result Value Ref Range Status   MRSA by PCR NEGATIVE NEGATIVE Final    Comment:        The GeneXpert MRSA Assay (FDA approved for NASAL specimens only), is one component of a comprehensive MRSA colonization surveillance program. It is not intended to diagnose MRSA infection nor to guide or monitor treatment for MRSA infections.     Radiology Reports Dg Chest Port 1 View  11/20/2015  CLINICAL DATA:  Sepsis EXAM: PORTABLE CHEST 1 VIEW COMPARISON:  07/22/2015 FINDINGS: Postoperative posterior fixation of the spine. Left spinal fixation rod is fractured but is unchanged since previous study. Probable thoracic kyphosis. Shallow inspiration. No focal airspace disease or consolidation in the lungs. Heart size and pulmonary vascularity are normal for technique. Ventricular peritoneal shunt tubing is demonstrated in the right chest. IMPRESSION: Shallow inspiration.  No evidence of active pulmonary disease. Electronically Signed   By: Lucienne Capers M.D.   On: 11/20/2015 04:48     CBC  Recent Labs Lab 11/19/15 1148 11/20/15 0540  WBC 18.0* 12.5*  HGB 15.0 12.1*  HCT 43.3 37.4*  PLT 458* 338  MCV 70.4* 71.2*  MCH 24.4*  23.0*  MCHC 34.6 32.4  RDW 15.3 15.3  LYMPHSABS  --  1.6  MONOABS  --  1.7*  EOSABS  --  0.0  BASOSABS  --  0.0    Chemistries   Recent Labs Lab 11/19/15 1148 11/20/15 0540  NA 125* 132*  K 5.6* 4.6  CL 86* 106  CO2 17* 15*  GLUCOSE 142* 100*  BUN 110* 89*  CREATININE 4.28* 2.46*  CALCIUM 9.8 8.8*  MG  --  1.9  AST 24 19  ALT 54 36  ALKPHOS 132* 100  BILITOT 0.5 0.5   ------------------------------------------------------------------------------------------------------------------ estimated creatinine clearance is 34.4 mL/min (by C-G formula based on Cr of 2.46). ------------------------------------------------------------------------------------------------------------------ No results for input(s): HGBA1C in the last 72 hours. ------------------------------------------------------------------------------------------------------------------ No results for input(s): CHOL, HDL, LDLCALC, TRIG, CHOLHDL, LDLDIRECT in the last 72 hours. ------------------------------------------------------------------------------------------------------------------ No  results for input(s): TSH, T4TOTAL, T3FREE, THYROIDAB in the last 72 hours.  Invalid input(s): FREET3 ------------------------------------------------------------------------------------------------------------------ No results for input(s): VITAMINB12, FOLATE, FERRITIN, TIBC, IRON, RETICCTPCT in the last 72 hours.  Coagulation profile No results for input(s): INR, PROTIME in the last 168 hours.  No results for input(s): DDIMER in the last 72 hours.  Cardiac Enzymes No results for input(s): CKMB, TROPONINI, MYOGLOBIN in the last 168 hours.  Invalid input(s): CK ------------------------------------------------------------------------------------------------------------------ Invalid input(s): POCBNP   CBG:  Recent Labs Lab 11/20/15 0726  GLUCAP 89       Studies: Dg Chest Port 1 View  11/20/2015  CLINICAL  DATA:  Sepsis EXAM: PORTABLE CHEST 1 VIEW COMPARISON:  07/22/2015 FINDINGS: Postoperative posterior fixation of the spine. Left spinal fixation rod is fractured but is unchanged since previous study. Probable thoracic kyphosis. Shallow inspiration. No focal airspace disease or consolidation in the lungs. Heart size and pulmonary vascularity are normal for technique. Ventricular peritoneal shunt tubing is demonstrated in the right chest. IMPRESSION: Shallow inspiration.  No evidence of active pulmonary disease. Electronically Signed   By: Lucienne Capers M.D.   On: 11/20/2015 04:48      Lab Results  Component Value Date   HGBA1C 5.6 11/06/2014   Lab Results  Component Value Date   CREATININE 2.46* 11/20/2015       Scheduled Meds: . cholecalciferol  2,000 Units Oral Daily  . enoxaparin (LOVENOX) injection  30 mg Subcutaneous Q24H  . [START ON 11/21/2015] levofloxacin (LEVAQUIN) IV  500 mg Intravenous Q48H  . metoprolol  25 mg Oral BID  . multivitamin with minerals  1 tablet Oral Daily  . verapamil  120 mg Oral QHS   Continuous Infusions: . sodium chloride 100 mL/hr at 11/20/15 0403    Principal Problem:   Sepsis secondary to UTI [facility]) Active Problems:   Spina bifida ([place])   Neurogenic bladder   Esophageal reflux   Essential hypertension, benign   UTI (lower urinary tract infection)   Headache   AKI (acute kidney injury) ([place])   Hyperkalemia   Hyponatremia   Borderline diabetes   Arnold-Chiari malformation (HCC)   Decubitus ulcer of buttock, stage 3 (HCC)   Complicated UTI (urinary tract infection)    Time spent: 45 minutes   [facility] Pager [phone]. If 7PM-7AM, please contact night-coverage at [email or web address], password [facility] 11/20/2015, 10:30 AM  LOS: 1 day

## 2015-11-20 NOTE — Consult Note (Signed)
WOC wound consult note Reason for Consult: Consult requested for right ischium chronic pressure injury.  Mother is well-informed regarding topical treatment and states the wound was healed until the home health agency discontinued his air mattress.  He is due to attend an appointment at the outpatient wound care center at [facility] next week. Wound type: Stage 3 pressure injury Pressure Ulcer POA: Yes Measurement: 2.5X2X.2cm Wound bed: 100% red and moist Drainage (amount, consistency, odor) small amt pink drainage, no odor Periwound: Intact skin surrounding Dressing procedure/placement/frequency: Air mattress replacement ordered for the bed to reduce pressure.  Foam dressing to protect and promote healing. Pt has an ostomy and mother states she has her own supplies from home and will perform pouch changes PRN. She denies further questions or need for assistance at this time. Please re-consult if further assistance is needed.  Thank-you,  Julien Girt MSN, [place], [place], [place], [place]

## 2015-11-20 NOTE — Progress Notes (Signed)
Nutrition Follow-up  DOCUMENTATION CODES:   Not applicable  INTERVENTION:   -Continue MVI daily  NUTRITION DIAGNOSIS:   Increased nutrient needs related to wound healing as evidenced by estimated needs.  GOAL:   Patient will meet greater than or equal to 90% of their needs  MONITOR:   PO intake, Supplement acceptance, Labs, Weight trends, Skin, I & O's  REASON FOR ASSESSMENT:   Low [place]    ASSESSMENT:   [name] is a 35 y.o. male with a history of recurring UTIs, most recently due to Escherichia coli and enterococcus, with below past medical history who is brought by his mother with symptoms of UTI.  Pt admitted with sepsis secondary to UTI.   Hx obtained from pt and pt mother at bedside. Both report that pt always has a great appetite, consuming 3 meals per day PTA. Noted pt consumed 100% of his breakfast tray this AM.   Pt mother confirms pt with stage III pressure injury to sacrum. She reveals that it was healing nicely until about a month ago, when his home air mattress was discontinued. Per RN documentation, wound with full granulation tissue.   Pt mother reports that pt takes MVI at home to support with wound healing. She shares that pt consumed Ensure supplements in the past when pt had an abdominal wound, however, these have since been discontinued, due to MD's concern for weight gain. Pt mother politely declined other nutrition interventions other than MVI.   Wt hx reviewed. Noted a 12/6% wt loss over the past year. Pt mother estimates that pt has lost 20# over the past month, however, this is not consistent with wt hx. Noted that pt has met with [facility] outpatient RDs in the past for assistance with weight loss and prediabetes.   Nutrition-Focused physical exam completed. Findings are no fat depletion, no muscle depletion, and no edema.   Discussed importance of good meal intake to promote wound healing. Pt and mother deny any further nutrition interventions  or questions at this time, however, expressed appreciation for visit.   Case discussed with RN.   Labs reviewed: Na: 132 (on IV supplementation).   Diet Order:  Diet heart healthy/carb modified Room service appropriate?: Yes; Fluid consistency:: Thin  Skin:  Wound (see comment) (granulated stage III buttocks)  Last BM:  11/20/15  Height:   Ht Readings from Last 1 Encounters:  11/20/15 5' (1.524 m)    Weight:   Wt Readings from Last 1 Encounters:  11/20/15 145 lb 4.5 oz (65.9 kg)    Ideal Body Weight:  43.4 kg  BMI:  Body mass index is 28.37 kg/(m^2).  Estimated Nutritional Needs:   Kcal:  1500-1700  Protein:  65-80 grams  Fluid:  1.5-1.7 L  EDUCATION NEEDS:   Education needs addressed  [place] Ruddy A. Jimmye Norman, RD, LDN, CDE Pager: [phone] After hours Pager: [phone]

## 2015-11-20 NOTE — Progress Notes (Signed)
Pharmacy Antibiotic Note  [name] is a 35 y.o. male admitted on 11/19/2015 with UTI.  Pt w/ spina bifida and neurogenic bladder. Pharmacy has been consulted for Levaquin dosing.  -WBC 12.5, afebrile, SCr= 2.46 (down; noted 0.83 on 07/2015 but also w/ ho AKI), CrCl ~ 35   Plan: -Change levaquin to 250mg  IV q24hr -Will follow renal function, cultures and clinical progress   Height: 5' (152.4 cm) Weight: 145 lb 4.5 oz (65.9 kg) IBW/kg (Calculated) : 50  Temp (24hrs), Avg:97.9 F (36.6 C), Min:97.5 F (36.4 C), Max:98.8 F (37.1 C)   Recent Labs Lab 11/19/15 1148 11/20/15 0540  WBC 18.0* 12.5*  CREATININE 4.28* 2.46*    Estimated Creatinine Clearance: 34.4 mL/min (by C-G formula based on Cr of 2.46).    Allergies  Allergen Reactions  . Latex Hives  . Succinylcholine Rash    Other reaction(s): Other (See Comments) Pt is half [place] - prolonged paralysis due to compromised metabolism. Succinylcholine given on 07/07/15 at outside hospital and pt experienced paralysis > 3 hours.  Due to being native [place], his mother states that he takes longer to "come out of it"  . Ciprofloxacin Other (See Comments)    IV only-- caused burning in arm   . Vancomycin Itching and Swelling  . Adhesive [Tape] Hives and Rash    Antimicrobials this admission: 3/9 Levaquin>>  Cultures 3/9 urine  Thank you for allowing pharmacy to be a part of this patient's care.  Hildred Laser, Pharm D 11/20/2015 11:21 AM

## 2015-11-21 DIAGNOSIS — A419 Sepsis, unspecified organism: Principal | ICD-10-CM

## 2015-11-21 DIAGNOSIS — K219 Gastro-esophageal reflux disease without esophagitis: Secondary | ICD-10-CM

## 2015-11-21 DIAGNOSIS — N179 Acute kidney failure, unspecified: Secondary | ICD-10-CM

## 2015-11-21 DIAGNOSIS — L89303 Pressure ulcer of unspecified buttock, stage 3: Secondary | ICD-10-CM

## 2015-11-21 DIAGNOSIS — N390 Urinary tract infection, site not specified: Secondary | ICD-10-CM

## 2015-11-21 LAB — CBC WITH DIFFERENTIAL/PLATELET
BASOS ABS: 0 10*3/uL (ref 0.0–0.1)
Basophils Relative: 0 %
EOS ABS: 0.1 10*3/uL (ref 0.0–0.7)
Eosinophils Relative: 1 %
HCT: 36.6 % — ABNORMAL LOW (ref 39.0–52.0)
HEMOGLOBIN: 11.5 g/dL — AB (ref 13.0–17.0)
LYMPHS ABS: 2.1 10*3/uL (ref 0.7–4.0)
LYMPHS PCT: 24 %
MCH: 22.9 pg — AB (ref 26.0–34.0)
MCHC: 31.4 g/dL (ref 30.0–36.0)
MCV: 72.9 fL — ABNORMAL LOW (ref 78.0–100.0)
Monocytes Absolute: 1 10*3/uL (ref 0.1–1.0)
Monocytes Relative: 12 %
NEUTROS PCT: 63 %
Neutro Abs: 5.5 10*3/uL (ref 1.7–7.7)
Platelets: 297 10*3/uL (ref 150–400)
RBC: 5.02 MIL/uL (ref 4.22–5.81)
RDW: 15.7 % — ABNORMAL HIGH (ref 11.5–15.5)
WBC: 8.7 10*3/uL (ref 4.0–10.5)

## 2015-11-21 LAB — COMPREHENSIVE METABOLIC PANEL
ALK PHOS: 90 U/L (ref 38–126)
ALT: 34 U/L (ref 17–63)
AST: 24 U/L (ref 15–41)
Albumin: 3.2 g/dL — ABNORMAL LOW (ref 3.5–5.0)
Anion gap: 9 (ref 5–15)
BUN: 43 mg/dL — AB (ref 6–20)
CALCIUM: 8.8 mg/dL — AB (ref 8.9–10.3)
CHLORIDE: 110 mmol/L (ref 101–111)
CO2: 20 mmol/L — AB (ref 22–32)
CREATININE: 0.99 mg/dL (ref 0.61–1.24)
GFR calc non Af Amer: >60 mL/min (ref >60)
GLUCOSE: 92 mg/dL (ref 65–99)
Potassium: 4.2 mmol/L (ref 3.5–5.1)
SODIUM: 139 mmol/L (ref 135–145)
Total Bilirubin: 0.7 mg/dL (ref 0.3–1.2)
Total Protein: 7.1 g/dL (ref 6.5–8.1)

## 2015-11-21 LAB — GLUCOSE, CAPILLARY
Glucose-Capillary: 85 mg/dL (ref 65–99)
Glucose-Capillary: 90 mg/dL (ref 65–99)

## 2015-11-21 MED ORDER — ENOXAPARIN SODIUM 40 MG/0.4ML ~~LOC~~ SOLN
40.0000 mg | SUBCUTANEOUS | Status: DC
  Administered 2015-11-22 – 2015-11-23 (×2): 40 mg via SUBCUTANEOUS
  Filled 2015-11-21 (×2): qty 0.4

## 2015-11-21 NOTE — Progress Notes (Signed)
PATIENT DETAILS Name: [name] Age: 35 y.o. Sex: male Date of Birth: 04-30-1981 Admit Date: 11/19/2015 Admitting Physician Reubin Milan, MD [id number] J, MD  Subjective: Afebrile overnight.  Assessment/Plan: Principal Problem: Sepsis secondary to UTI: Sepsis pathophysiology has resolved, remains afebrile, nontoxic looking- leukocytosis has resolved -continue levofloxacin-await culture results.  Active Problems: ARF: Likely prerenal azotemia in a setting of sepsis. Resolved with IV fluids. Follow electrolytes periodically.  Hypertension: BP controlled-continue verapamil and metoprolol.  History of GERD: Continue PPI.  History of headaches: Currently headache free. Followed by neurology at [facility]-continue verapamil.  Decubitus ulcer of buttock, stage 3: present prior to admission, wound care following.   History of congenital hydrocephalus-status post VP shunt placement-with most recent revision on 07/10/15: Appears stable, being followed by neurosurgery at [facility].  History of spina bifida/neurogenic bladder-with ileostomy in place.  Paraplegia  Disposition: Remain inpatient  Antimicrobial agents  See below  Anti-infectives    Start     Dose/Rate Route Frequency Ordered Stop   11/21/15 2245  levofloxacin (LEVAQUIN) IVPB 500 mg  Status:  Discontinued     500 mg 100 mL/hr over 60 Minutes Intravenous Every 48 hours 11/19/15 2233 11/20/15 1124   11/20/15 2200  Levofloxacin (LEVAQUIN) IVPB 250 mg     250 mg 50 mL/hr over 60 Minutes Intravenous Every 24 hours 11/20/15 1124     11/20/15 2100  cefTRIAXone (ROCEPHIN) 1 g in dextrose 5 % 50 mL IVPB  Status:  Discontinued     1 g 100 mL/hr over 30 Minutes Intravenous Every 24 hours 11/20/15 0339 11/20/15 0755   11/19/15 2245  levofloxacin (LEVAQUIN) IVPB 750 mg     750 mg 100 mL/hr over 90 Minutes Intravenous  Once 11/19/15 2233 11/20/15 0050   11/19/15 2145  cefTRIAXone (ROCEPHIN) 1 g in  dextrose 5 % 50 mL IVPB     1 g 100 mL/hr over 30 Minutes Intravenous  Once 11/19/15 2138 11/19/15 2215      DVT Prophylaxis: Prophylactic Lovenox   Code Status: Full code   Family Communication None at bedside  Procedures: None  CONSULTS:  None  Time spent 25 minutes-Greater than 50% of this time was spent in counseling, explanation of diagnosis, planning of further management, and coordination of care.  MEDICATIONS: Scheduled Meds: . cholecalciferol  2,000 Units Oral Daily  . enoxaparin (LOVENOX) injection  30 mg Subcutaneous Q24H  . levofloxacin (LEVAQUIN) IV  250 mg Intravenous Q24H  . metoprolol  25 mg Oral BID  . multivitamin with minerals  1 tablet Oral Daily  . verapamil  120 mg Oral QHS   Continuous Infusions: . sodium chloride 100 mL/hr (11/21/15 1206)   PRN Meds:.acetaminophen, ondansetron **OR** ondansetron (ZOFRAN) IV, oxyCODONE    PHYSICAL EXAM: Vital signs in last 24 hours: Filed Vitals:   11/21/15 0600 11/21/15 0700 11/21/15 0800 11/21/15 1125  BP: 108/60 112/68 115/62 93/66  Pulse: 76 73 77 88  Temp:  97.9 F (36.6 C)  98.3 F (36.8 C)  TempSrc:  Oral  Oral  Resp: 16 17 16 15   Height:      Weight:      SpO2: 100% 100% 100% 100%    Weight change:  Filed Weights   11/20/15 0400  Weight: 65.9 kg (145 lb 4.5 oz)   Body mass index is 28.37 kg/(m^2).   Gen Exam: Awake and alert with clear speech.  Neck: Supple,  No JVD.   Chest: B/L Clear.   CVS: S1 S2 Regular, no murmurs.  Abdomen: soft, BS +, non tender, non distended. Ileostomy in place. Midline incision present. Extremities: no edema, lower extremities warm to touch. Neurologic: Paraplegic  Skin: No Rash.   Wounds: N/A.   Intake/Output from previous day:  Intake/Output Summary (Last 24 hours) at 11/21/15 1320 Last data filed at 11/21/15 1000  Gross per 24 hour  Intake   2990 ml  Output   4400 ml  Net  -1410 ml     LAB RESULTS: CBC  Recent Labs Lab 11/19/15 1148  11/20/15 0540 11/21/15 0324  WBC 18.0* 12.5* 8.7  HGB 15.0 12.1* 11.5*  HCT 43.3 37.4* 36.6*  PLT 458* 338 297  MCV 70.4* 71.2* 72.9*  MCH 24.4* 23.0* 22.9*  MCHC 34.6 32.4 31.4  RDW 15.3 15.3 15.7*  LYMPHSABS  --  1.6 2.1  MONOABS  --  1.7* 1.0  EOSABS  --  0.0 0.1  BASOSABS  --  0.0 0.0    Chemistries   Recent Labs Lab 11/19/15 1148 11/20/15 0540 11/21/15 0324  NA 125* 132* 139  K 5.6* 4.6 4.2  CL 86* 106 110  CO2 17* 15* 20*  GLUCOSE 142* 100* 92  BUN 110* 89* 43*  CREATININE 4.28* 2.46* 0.99  CALCIUM 9.8 8.8* 8.8*  MG  --  1.9  --     CBG:  Recent Labs Lab 11/20/15 0726 11/20/15 1121 11/20/15 1655 11/21/15 0745 11/21/15 1125  GLUCAP 89 155* 93 85 90    GFR Estimated Creatinine Clearance: 85.5 mL/min (by C-G formula based on Cr of 0.99).  Coagulation profile No results for input(s): INR, PROTIME in the last 168 hours.  Cardiac Enzymes No results for input(s): CKMB, TROPONINI, MYOGLOBIN in the last 168 hours.  Invalid input(s): CK  Invalid input(s): POCBNP No results for input(s): DDIMER in the last 72 hours. No results for input(s): HGBA1C in the last 72 hours. No results for input(s): CHOL, HDL, LDLCALC, TRIG, CHOLHDL, LDLDIRECT in the last 72 hours. No results for input(s): TSH, T4TOTAL, T3FREE, THYROIDAB in the last 72 hours.  Invalid input(s): FREET3 No results for input(s): VITAMINB12, FOLATE, FERRITIN, TIBC, IRON, RETICCTPCT in the last 72 hours. No results for input(s): LIPASE, AMYLASE in the last 72 hours.  Urine Studies No results for input(s): UHGB, CRYS in the last 72 hours.  Invalid input(s): UACOL, UAPR, USPG, UPH, UTP, UGL, UKET, UBIL, UNIT, UROB, ULEU, UEPI, UWBC, URBC, UBAC, CAST, UCOM, BILUA  MICROBIOLOGY: Recent Results (from the past 240 hour(s))  Urine culture     Status: None (Preliminary result)   Collection Time: 11/19/15  9:11 PM  Result Value Ref Range Status   Specimen Description URINE, CATHETERIZED  Final    Special Requests NONE  Final   Culture TOO YOUNG TO READ  Final   Report Status PENDING  Incomplete  MRSA PCR Screening     Status: None   Collection Time: 11/20/15  3:39 AM  Result Value Ref Range Status   MRSA by PCR NEGATIVE NEGATIVE Final    Comment:        The GeneXpert MRSA Assay (FDA approved for NASAL specimens only), is one component of a comprehensive MRSA colonization surveillance program. It is not intended to diagnose MRSA infection nor to guide or monitor treatment for MRSA infections.     RADIOLOGY STUDIES/RESULTS: Dg Chest Port 1 View  11/20/2015  CLINICAL DATA:  Sepsis EXAM: PORTABLE CHEST  1 VIEW COMPARISON:  07/22/2015 FINDINGS: Postoperative posterior fixation of the spine. Left spinal fixation rod is fractured but is unchanged since previous study. Probable thoracic kyphosis. Shallow inspiration. No focal airspace disease or consolidation in the lungs. Heart size and pulmonary vascularity are normal for technique. Ventricular peritoneal shunt tubing is demonstrated in the right chest. IMPRESSION: Shallow inspiration.  No evidence of active pulmonary disease. Electronically Signed   By: Lucienne Capers M.D.   On: 11/20/2015 04:48    Oren Binet, MD  [facility] Pager:[phone]  If 7PM-7AM, please contact night-coverage [email or web address] Password [email or web address] 11/21/2015, 1:20 PM   LOS: 2 days

## 2015-11-22 DIAGNOSIS — I10 Essential (primary) hypertension: Secondary | ICD-10-CM

## 2015-11-22 LAB — URINE CULTURE: Culture: >=100000

## 2015-11-22 LAB — COMPREHENSIVE METABOLIC PANEL
ALT: 35 U/L (ref 17–63)
AST: 26 U/L (ref 15–41)
Albumin: 3.2 g/dL — ABNORMAL LOW (ref 3.5–5.0)
Alkaline Phosphatase: 87 U/L (ref 38–126)
Anion gap: 9 (ref 5–15)
BUN: 27 mg/dL — AB (ref 6–20)
CHLORIDE: 105 mmol/L (ref 101–111)
CO2: 23 mmol/L (ref 22–32)
CREATININE: 0.78 mg/dL (ref 0.61–1.24)
Calcium: 9.3 mg/dL (ref 8.9–10.3)
GFR calc Af Amer: >60 mL/min (ref >60)
GFR calc non Af Amer: >60 mL/min (ref >60)
Glucose, Bld: 98 mg/dL (ref 65–99)
Potassium: 3.5 mmol/L (ref 3.5–5.1)
SODIUM: 137 mmol/L (ref 135–145)
Total Bilirubin: 0.4 mg/dL (ref 0.3–1.2)
Total Protein: 7 g/dL (ref 6.5–8.1)

## 2015-11-22 MED ORDER — LEVOFLOXACIN IN D5W 500 MG/100ML IV SOLN
500.0000 mg | INTRAVENOUS | Status: DC
  Administered 2015-11-22: 500 mg via INTRAVENOUS
  Filled 2015-11-22 (×2): qty 100

## 2015-11-22 NOTE — Progress Notes (Signed)
PATIENT DETAILS Name: [name] Age: 35 y.o. Sex: male Date of Birth: 1981-05-02 Admit Date: 11/19/2015 Admitting Physician Reubin Milan, MD [id number] J, MD  Subjective: Afebrile overnight-doing much better  Assessment/Plan: Principal Problem: Sepsis secondary to UTI: Sepsis pathophysiology has resolved, remains afebrile, nontoxic looking- leukocytosis has resolved -continue levofloxacin-urine culture prelim-E Coli-awaiting sensitivities  Active Problems: ARF: Likely prerenal azotemia in a setting of sepsis. Resolved with IV fluids. Follow electrolytes periodically.  Hypertension: BP controlled-continue verapamil and metoprolol.  History of GERD: Continue PPI.  History of headaches: Currently headache free. Followed by neurology at [facility]-continue verapamil.  Decubitus ulcer of buttock, stage 3: present prior to admission, wound care following.   History of congenital hydrocephalus-status post VP shunt placement-with most recent revision on 07/10/15: Appears stable, being followed by neurosurgery at [facility].  History of spina bifida/neurogenic bladder-with ileostomy in place.  Paraplegia  Disposition: Remain inpatient-home 3/13  Antimicrobial agents  See below  Anti-infectives    Start     Dose/Rate Route Frequency Ordered Stop   11/21/15 2245  levofloxacin (LEVAQUIN) IVPB 500 mg  Status:  Discontinued     500 mg 100 mL/hr over 60 Minutes Intravenous Every 48 hours 11/19/15 2233 11/20/15 1124   11/20/15 2200  Levofloxacin (LEVAQUIN) IVPB 250 mg     250 mg 50 mL/hr over 60 Minutes Intravenous Every 24 hours 11/20/15 1124     11/20/15 2100  cefTRIAXone (ROCEPHIN) 1 g in dextrose 5 % 50 mL IVPB  Status:  Discontinued     1 g 100 mL/hr over 30 Minutes Intravenous Every 24 hours 11/20/15 0339 11/20/15 0755   11/19/15 2245  levofloxacin (LEVAQUIN) IVPB 750 mg     750 mg 100 mL/hr over 90 Minutes Intravenous  Once 11/19/15 2233 11/20/15  0050   11/19/15 2145  cefTRIAXone (ROCEPHIN) 1 g in dextrose 5 % 50 mL IVPB     1 g 100 mL/hr over 30 Minutes Intravenous  Once 11/19/15 2138 11/19/15 2215      DVT Prophylaxis: Prophylactic Lovenox   Code Status: Full code   Family Communication Mother at bedside  Procedures: None  CONSULTS:  None  Time spent 25 minutes-Greater than 50% of this time was spent in counseling, explanation of diagnosis, planning of further management, and coordination of care.  MEDICATIONS: Scheduled Meds: . cholecalciferol  2,000 Units Oral Daily  . enoxaparin (LOVENOX) injection  40 mg Subcutaneous Q24H  . levofloxacin (LEVAQUIN) IV  250 mg Intravenous Q24H  . metoprolol  25 mg Oral BID  . multivitamin with minerals  1 tablet Oral Daily  . verapamil  120 mg Oral QHS   Continuous Infusions:   PRN Meds:.acetaminophen, ondansetron **OR** ondansetron (ZOFRAN) IV, oxyCODONE    PHYSICAL EXAM: Vital signs in last 24 hours: Filed Vitals:   11/21/15 2253 11/21/15 2256 11/21/15 2258 11/22/15 0555  BP: 116/61 116/61 116/61 111/67  Pulse: 83  83 77  Temp:   98.6 F (37 C) 98.4 F (36.9 C)  TempSrc:   Oral   Resp:   16 16  Height:      Weight:    66.18 kg (145 lb 14.4 oz)  SpO2:   100% 100%    Weight change:  Filed Weights   11/20/15 0400 11/22/15 0555  Weight: 65.9 kg (145 lb 4.5 oz) 66.18 kg (145 lb 14.4 oz)   Body mass index is 28.49 kg/(m^2).  Gen Exam: Awake and alert with clear speech.  Neck: Supple, No JVD.   Chest: B/L Clear.  No rales CVS: S1 S2 Regular, no murmurs.  Abdomen: soft, BS +, non tender, non distended. Ileostomy in place. Midline incision present. Extremities: no edema, lower extremities warm to touch. Neurologic: Paraplegic  Skin: No Rash.   Wounds: N/A.   Intake/Output from previous day:  Intake/Output Summary (Last 24 hours) at 11/22/15 1241 Last data filed at 11/22/15 1100  Gross per 24 hour  Intake   2610 ml  Output   4500 ml  Net  -1890 ml       LAB RESULTS: CBC  Recent Labs Lab 11/19/15 1148 11/20/15 0540 11/21/15 0324  WBC 18.0* 12.5* 8.7  HGB 15.0 12.1* 11.5*  HCT 43.3 37.4* 36.6*  PLT 458* 338 297  MCV 70.4* 71.2* 72.9*  MCH 24.4* 23.0* 22.9*  MCHC 34.6 32.4 31.4  RDW 15.3 15.3 15.7*  LYMPHSABS  --  1.6 2.1  MONOABS  --  1.7* 1.0  EOSABS  --  0.0 0.1  BASOSABS  --  0.0 0.0    Chemistries   Recent Labs Lab 11/19/15 1148 11/20/15 0540 11/21/15 0324 11/22/15 0344  NA 125* 132* 139 137  K 5.6* 4.6 4.2 3.5  CL 86* 106 110 105  CO2 17* 15* 20* 23  GLUCOSE 142* 100* 92 98  BUN 110* 89* 43* 27*  CREATININE 4.28* 2.46* 0.99 0.78  CALCIUM 9.8 8.8* 8.8* 9.3  MG  --  1.9  --   --     CBG:  Recent Labs Lab 11/20/15 0726 11/20/15 1121 11/20/15 1655 11/21/15 0745 11/21/15 1125  GLUCAP 89 155* 93 85 90    GFR Estimated Creatinine Clearance: 105.9 mL/min (by C-G formula based on Cr of 0.78).  Coagulation profile No results for input(s): INR, PROTIME in the last 168 hours.  Cardiac Enzymes No results for input(s): CKMB, TROPONINI, MYOGLOBIN in the last 168 hours.  Invalid input(s): CK  Invalid input(s): POCBNP No results for input(s): DDIMER in the last 72 hours. No results for input(s): HGBA1C in the last 72 hours. No results for input(s): CHOL, HDL, LDLCALC, TRIG, CHOLHDL, LDLDIRECT in the last 72 hours. No results for input(s): TSH, T4TOTAL, T3FREE, THYROIDAB in the last 72 hours.  Invalid input(s): FREET3 No results for input(s): VITAMINB12, FOLATE, FERRITIN, TIBC, IRON, RETICCTPCT in the last 72 hours. No results for input(s): LIPASE, AMYLASE in the last 72 hours.  Urine Studies No results for input(s): UHGB, CRYS in the last 72 hours.  Invalid input(s): UACOL, UAPR, USPG, UPH, UTP, UGL, UKET, UBIL, UNIT, UROB, ULEU, UEPI, UWBC, URBC, UBAC, CAST, UCOM, BILUA  MICROBIOLOGY: Recent Results (from the past 240 hour(s))  Urine culture     Status: None   Collection Time: 11/19/15   9:11 PM  Result Value Ref Range Status   Specimen Description URINE, CATHETERIZED  Final   Special Requests NONE  Final   Culture >=100,000 COLONIES/mL ESCHERICHIA COLI  Final   Report Status 11/22/2015 FINAL  Final   Organism ID, Bacteria ESCHERICHIA COLI  Final      Susceptibility   Escherichia coli - MIC*    AMPICILLIN >=32 RESISTANT Resistant     CEFAZOLIN <=4 SENSITIVE Sensitive     CEFTRIAXONE <=1 SENSITIVE Sensitive     CIPROFLOXACIN 1 SENSITIVE Sensitive     GENTAMICIN <=1 SENSITIVE Sensitive     IMIPENEM <=0.25 SENSITIVE Sensitive     NITROFURANTOIN <=16 SENSITIVE Sensitive  TRIMETH/SULFA <=20 SENSITIVE Sensitive     AMPICILLIN/SULBACTAM 16 INTERMEDIATE Intermediate     PIP/TAZO <=4 SENSITIVE Sensitive     * >=100,000 COLONIES/mL ESCHERICHIA COLI  MRSA PCR Screening     Status: None   Collection Time: 11/20/15  3:39 AM  Result Value Ref Range Status   MRSA by PCR NEGATIVE NEGATIVE Final    Comment:        The GeneXpert MRSA Assay (FDA approved for NASAL specimens only), is one component of a comprehensive MRSA colonization surveillance program. It is not intended to diagnose MRSA infection nor to guide or monitor treatment for MRSA infections.     RADIOLOGY STUDIES/RESULTS: Dg Chest Port 1 View  11/20/2015  CLINICAL DATA:  Sepsis EXAM: PORTABLE CHEST 1 VIEW COMPARISON:  07/22/2015 FINDINGS: Postoperative posterior fixation of the spine. Left spinal fixation rod is fractured but is unchanged since previous study. Probable thoracic kyphosis. Shallow inspiration. No focal airspace disease or consolidation in the lungs. Heart size and pulmonary vascularity are normal for technique. Ventricular peritoneal shunt tubing is demonstrated in the right chest. IMPRESSION: Shallow inspiration.  No evidence of active pulmonary disease. Electronically Signed   By: Lucienne Capers M.D.   On: 11/20/2015 04:48    Oren Binet, MD  [facility] Pager:[phone]  If  7PM-7AM, please contact night-coverage [email or web address] Password [email or web address] 11/22/2015, 12:41 PM   LOS: 3 days

## 2015-11-23 DIAGNOSIS — R51 Headache: Secondary | ICD-10-CM

## 2015-11-23 MED ORDER — CEPHALEXIN 500 MG PO CAPS: 500.0000 mg | ORAL_CAPSULE | Freq: Three times a day (TID) | ORAL | Status: DC

## 2015-11-23 MED ORDER — CEPHALEXIN 500 MG PO CAPS
500.0000 mg | ORAL_CAPSULE | Freq: Three times a day (TID) | ORAL | Status: DC
  Administered 2015-11-23: 500 mg via ORAL
  Filled 2015-11-23: qty 1

## 2015-11-23 NOTE — Discharge Summary (Signed)
PATIENT DETAILS Name: [name] Age: 35 y.o. Sex: male Date of Birth: [birth year] MRN: [id number]. Admitting Physician: Reubin Milan, MD [id number] J, MD  Admit Date: 11/19/2015 Discharge date: 11/23/2015  Recommendations for Outpatient Follow-up:  1. Ensure follow up at Wound care clinic at [facility] 2. Ensure follow up with Neurology and Neurosurgery  PRIMARY DISCHARGE DIAGNOSIS:  Principal Problem:   Sepsis secondary to UTI [facility]) Active Problems:   Spina bifida ([place])   Neurogenic bladder   Esophageal reflux   Essential hypertension, benign   UTI (lower urinary tract infection)   Headache   AKI (acute kidney injury) ([place])   Hyperkalemia   Hyponatremia   Borderline diabetes   Arnold-Chiari malformation (HCC)   Decubitus ulcer of buttock, stage 3 (HCC)   Complicated UTI (urinary tract infection)      PAST MEDICAL HISTORY: Past Medical History  Diagnosis Date  . Hypertension   . GERD (gastroesophageal reflux disease)   . Non-healing surgical wound     ABDOMINE  . Spina bifida with hydrocephalus, lumbar region ([place])     CONGENITAL--  HAS VP SHUNT--  PARALYZIED MID ABDOMINE DOWN  . Arnold-Chiari malformation ([place])   . Neurogenic bladder     INCONTINENT -  . 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 Ileostomy in place [facility])   . Wheelchair bound     CAN TRASFER SELF WITHOUT BOARD  . Urinary incontinence with continuous leakage   . Wears glasses   . Pressure ulcer, buttock   . Borderline diabetes     DIET CONTROLLED  . Heart palpitations     SECONDARY TO VP SHUNT DRAIN  . History of seizure as newborn     NO MEDS SINCE 35 YRS OLD--  NO ISSUES SINCE  . Neurogenic bowel   . At risk for sleep apnea     STOP-BANG = 4   SENT TO PCP 11-01-2013    DISCHARGE MEDICATIONS: Current Discharge Medication List    START taking these medications   Details  cephALEXin (KEFLEX) 500 MG capsule Take 1 capsule (500 mg total) by  mouth every 8 (eight) hours. Qty: 12 capsule, Refills: 0      CONTINUE these medications which have NOT CHANGED   Details  acetaminophen (TYLENOL) 650 MG CR tablet Take 1,300 mg by mouth every 8 (eight) hours as needed for pain.    Cholecalciferol (VITAMIN D3) 2000 units capsule Take 2,000 Units by mouth daily.    collagenase (SANTYL) ointment Apply 1 application topically daily as needed (for wound care).     Cranberry 400 MG CAPS Take 4,200 mg by mouth every morning.     metoprolol (LOPRESSOR) 50 MG tablet Take 50 mg by mouth 2 (two) times daily. Refills: 0    Multiple Vitamin (MULTIVITAMIN) tablet Take 1 tablet by mouth daily.    ondansetron (ZOFRAN) 4 MG tablet Take 4 mg by mouth every 8 (eight) hours as needed for nausea or vomiting.    oxyCODONE (OXY IR/ROXICODONE) 5 MG immediate release tablet Take 1 tablet (5 mg total) by mouth every 6 (six) hours as needed. 1 by mouth every 3 hours as needed for MILD pain, take 2 by mouth every 3 hours as needed for MODERATE pain, 3 by mouth every 3 hours as needed for SEVERE pain Qty: 30 tablet, Refills: 0    potassium chloride 20 MEQ TBCR Take 20 mEq by mouth daily. Qty: 30 tablet, Refills:  0    verapamil (CALAN-SR) 120 MG CR tablet Take 120 mg by mouth at bedtime. Refills: 3        ALLERGIES:   Allergies  Allergen Reactions  . Latex Hives  . Succinylcholine Rash    Other reaction(s): Other (See Comments) Pt is half [place] - prolonged paralysis due to compromised metabolism. Succinylcholine given on 07/07/15 at outside hospital and pt experienced paralysis > 3 hours.  Due to being native [place], his mother states that he takes longer to "come out of it"  . Ciprofloxacin Other (See Comments)    IV only-- caused burning in arm   . Vancomycin Itching and Swelling  . Adhesive [Tape] Hives and Rash    BRIEF HPI:  See H&P, Labs, Consult and Test reports for all details in brief,[name] is a 35 y.o. male with a  history of recurring UTIs, most recently due to Escherichia coli and enterococcus-admitted for evaluation of foul smelling urine and Gen weakness. He was found to have a UA consistent with UTI-he was admitted for further evaluation and treatment.  CONSULTATIONS:   None  PERTINENT RADIOLOGIC STUDIES: Dg Chest Port 1 View  11/20/2015  CLINICAL DATA:  Sepsis EXAM: PORTABLE CHEST 1 VIEW COMPARISON:  07/22/2015 FINDINGS: Postoperative posterior fixation of the spine. Left spinal fixation rod is fractured but is unchanged since previous study. Probable thoracic kyphosis. Shallow inspiration. No focal airspace disease or consolidation in the lungs. Heart size and pulmonary vascularity are normal for technique. Ventricular peritoneal shunt tubing is demonstrated in the right chest. IMPRESSION: Shallow inspiration.  No evidence of active pulmonary disease. Electronically Signed   By: Lucienne Capers M.D.   On: 11/20/2015 04:48     PERTINENT LAB RESULTS: CBC:  Recent Labs  11/21/15 0324  WBC 8.7  HGB 11.5*  HCT 36.6*  PLT 297   CMET CMP     Component Value Date/Time   NA 137 11/22/2015 0344   K 3.5 11/22/2015 0344   CL 105 11/22/2015 0344   CO2 23 11/22/2015 0344   GLUCOSE 98 11/22/2015 0344   BUN 27* 11/22/2015 0344   CREATININE 0.78 11/22/2015 0344   CALCIUM 9.3 11/22/2015 0344   PROT 7.0 11/22/2015 0344   ALBUMIN 3.2* 11/22/2015 0344   AST 26 11/22/2015 0344   ALT 35 11/22/2015 0344   ALKPHOS 87 11/22/2015 0344   BILITOT 0.4 11/22/2015 0344   GFRNONAA >60 11/22/2015 0344   GFRAA >60 11/22/2015 0344    GFR Estimated Creatinine Clearance: 108.2 mL/min (by C-G formula based on Cr of 0.78). No results for input(s): LIPASE, AMYLASE in the last 72 hours. No results for input(s): CKTOTAL, CKMB, CKMBINDEX, TROPONINI in the last 72 hours. Invalid input(s): POCBNP No results for input(s): DDIMER in the last 72 hours. No results for input(s): HGBA1C in the last 72 hours. No results  for input(s): CHOL, HDL, LDLCALC, TRIG, CHOLHDL, LDLDIRECT in the last 72 hours. No results for input(s): TSH, T4TOTAL, T3FREE, THYROIDAB in the last 72 hours.  Invalid input(s): FREET3 No results for input(s): VITAMINB12, FOLATE, FERRITIN, TIBC, IRON, RETICCTPCT in the last 72 hours. Coags: No results for input(s): INR in the last 72 hours.  Invalid input(s): PT Microbiology: Recent Results (from the past 240 hour(s))  Urine culture     Status: None   Collection Time: 11/19/15  9:11 PM  Result Value Ref Range Status   Specimen Description URINE, CATHETERIZED  Final   Special Requests NONE  Final  Culture >=100,000 COLONIES/mL ESCHERICHIA COLI  Final   Report Status 11/22/2015 FINAL  Final   Organism ID, Bacteria ESCHERICHIA COLI  Final      Susceptibility   Escherichia coli - MIC*    AMPICILLIN >=32 RESISTANT Resistant     CEFAZOLIN <=4 SENSITIVE Sensitive     CEFTRIAXONE <=1 SENSITIVE Sensitive     CIPROFLOXACIN 1 SENSITIVE Sensitive     GENTAMICIN <=1 SENSITIVE Sensitive     IMIPENEM <=0.25 SENSITIVE Sensitive     NITROFURANTOIN <=16 SENSITIVE Sensitive     TRIMETH/SULFA <=20 SENSITIVE Sensitive     AMPICILLIN/SULBACTAM 16 INTERMEDIATE Intermediate     PIP/TAZO <=4 SENSITIVE Sensitive     * >=100,000 COLONIES/mL ESCHERICHIA COLI  MRSA PCR Screening     Status: None   Collection Time: 11/20/15  3:39 AM  Result Value Ref Range Status   MRSA by PCR NEGATIVE NEGATIVE Final    Comment:        The GeneXpert MRSA Assay (FDA approved for NASAL specimens only), is one component of a comprehensive MRSA colonization surveillance program. It is not intended to diagnose MRSA infection nor to guide or monitor treatment for MRSA infections.      BRIEF HOSPITAL COURSE:  Sepsis secondary to UTI: Sepsis pathophysiology has resolved, remains afebrile, nontoxic looking- leukocytosis has resolved -managed with IV levaquin-urine culture shows pan-sensitive E Coli-will transition to  oral Keflex on discharge.   Active Problems: ARF: Likely prerenal azotemia in a setting of sepsis. Resolved with IV fluids.   Hypertension: BP controlled-continue verapamil and metoprolol.  History of GERD: Continue PPI.  History of headaches: Currently headache free. Followed by neurology at [facility]-continue verapamil.  Decubitus ulcer of buttock, stage 3: present prior to admission, HHRN on discharge-patient has appointment with [facility] wound clinic.  History of congenital hydrocephalus-status post VP shunt placement-with most recent revision on 07/10/15: Appears stable, being followed by neurosurgery at [facility].  History of spina bifida/neurogenic bladder-with ileostomy in place.  Paraplegia   TODAY-DAY OF DISCHARGE:  Subjective:   [name] today has no headache,no chest abdominal pain,no new weakness tingling or numbness, feels much better wants to go home today. *  Objective:   Blood pressure 104/57, pulse 79, temperature 98.2 F (36.8 C), temperature source Oral, resp. rate 18, height 5' (1.524 m), weight 69.174 kg (152 lb 8 oz), SpO2 100 %.  Intake/Output Summary (Last 24 hours) at 11/23/15 0932 Last data filed at 11/23/15 0521  Gross per 24 hour  Intake    480 ml  Output   3000 ml  Net  -2520 ml   Filed Weights   11/20/15 0400 11/22/15 0555 11/23/15 0513  Weight: 65.9 kg (145 lb 4.5 oz) 66.18 kg (145 lb 14.4 oz) 69.174 kg (152 lb 8 oz)    Exam Awake Alert, Oriented *3, No new F.N deficits, Normal affect [place].AT,PERRAL Supple Neck,No JVD, No cervical lymphadenopathy appriciated.  Symmetrical Chest wall movement, Good air movement bilaterally, CTAB RRR,No Gallops,Rubs or new Murmurs, No Parasternal Heave +ve B.Sounds, Abd Soft, Non tender, No organomegaly appriciated, No rebound -guarding or rigidity. No Cyanosis, Clubbing or edema, No new Rash or bruise  DISCHARGE CONDITION: Stable  DISPOSITION: Home with home health services  DISCHARGE INSTRUCTIONS:      Activity:  As tolerated with Full fall precautions use walker/cane & assistance as needed  Get Medicines reviewed and adjusted: Please take all your medications with you for your next visit with your Primary MD  Please request your Primary  MD to go over all hospital tests and procedure/radiological results at the follow up, please ask your Primary MD to get all Hospital records sent to his/her office.  If you experience worsening of your admission symptoms, develop shortness of breath, life threatening emergency, suicidal or homicidal thoughts you must seek medical attention immediately by calling 911 or calling your MD immediately  if symptoms less severe.  You must read complete instructions/literature along with all the possible adverse reactions/side effects for all the Medicines you take and that have been prescribed to you. Take any new Medicines after you have completely understood and accpet all the possible adverse reactions/side effects.   Do not drive when taking Pain medications.   Do not take more than prescribed Pain, Sleep and Anxiety Medications  Special Instructions: If you have smoked or chewed Tobacco  in the last 2 yrs please stop smoking, stop any regular Alcohol  and or any Recreational drug use.  Wear Seat belts while driving.  Please note  You were cared for by a hospitalist during your hospital stay. Once you are discharged, your primary care physician will handle any further medical issues. Please note that NO REFILLS for any discharge medications will be authorized once you are discharged, as it is imperative that you return to your primary care physician (or establish a relationship with a primary care physician if you do not have one) for your aftercare needs so that they can reassess your need for medications and monitor your lab values.  Diet recommendation: Regular Diet  Discharge Instructions    Call MD for:  persistant nausea and vomiting    Complete  by:  As directed      Call MD for:  redness, tenderness, or signs of infection (pain, swelling, redness, odor or green/yellow discharge around incision site)    Complete by:  As directed      Call MD for:  severe uncontrolled pain    Complete by:  As directed      Diet general    Complete by:  As directed      Increase activity slowly    Complete by:  As directed            Follow-up Information    Follow up with Elyn Peers, MD. Schedule an appointment as soon as possible for a visit in 1 week.   Specialty:  Family Medicine   Why:  Hospital follow up   Contact information:   [address] [place] [address] [phone]      Total Time spent on discharge equals  45 minutes.  SignedOren Binet 11/23/2015 9:32 AM

## 2015-11-23 NOTE — Care Management Note (Addendum)
Case Management Note  Patient Details  Name: [name] MRN: [id number] Date of Birth: 1981-09-04  Subjective/Objective:                    Action/Plan:  Ordered air mattress through [facility] , however, patient does not quality for same due to insurance criteria . Due to stage and size of wound patient qualifies for alternating pressure pump pad . Tobin Chad with [facility] will explain to patient and his mother . Paged MD for order.    Expected Discharge Date:                  Expected Discharge Plan:  [place]  In-House Referral:     Discharge planning Services     Post Acute Care Choice:  Home Health Choice offered to:  Parent, Patient  DME Arranged:  Air overlay mattress DME Agency:  [place]:  RN, Nurse's Aide [place] Agency:  [place]  Status of Service:  In process, will continue to follow  Medicare Important Message Given:    Date Medicare IM Given:    Medicare IM give by:    Date Additional Medicare IM Given:    Additional Medicare Important Message give by:     If discussed at [place] of Stay Meetings, dates discussed:    Additional Comments:  Marilu Favre, RN 11/23/2015, 10:33 AM

## 2015-11-23 NOTE — Care Management Important Message (Signed)
Important Message  Patient Details  Name: [name] MRN: [id number] Date of Birth: [birth year]   Medicare Important Message Given:  Yes    Barb Merino [place] 11/23/2015, 12:40 PM

## 2015-11-25 DIAGNOSIS — K420 Umbilical hernia with obstruction, without gangrene: Secondary | ICD-10-CM | POA: Diagnosis not present

## 2015-11-25 DIAGNOSIS — L89309 Pressure ulcer of unspecified buttock, unspecified stage: Secondary | ICD-10-CM | POA: Diagnosis not present

## 2015-11-27 DIAGNOSIS — T783XXA Angioneurotic edema, initial encounter: Secondary | ICD-10-CM | POA: Diagnosis not present

## 2015-11-27 DIAGNOSIS — R799 Abnormal finding of blood chemistry, unspecified: Secondary | ICD-10-CM | POA: Diagnosis not present

## 2015-11-27 DIAGNOSIS — Q054 Unspecified spina bifida with hydrocephalus: Secondary | ICD-10-CM | POA: Diagnosis not present

## 2015-11-29 DIAGNOSIS — R51 Headache: Secondary | ICD-10-CM | POA: Diagnosis not present

## 2015-11-29 DIAGNOSIS — Z982 Presence of cerebrospinal fluid drainage device: Secondary | ICD-10-CM | POA: Diagnosis not present

## 2015-11-29 DIAGNOSIS — L89313 Pressure ulcer of right buttock, stage 3: Secondary | ICD-10-CM | POA: Diagnosis not present

## 2015-11-29 DIAGNOSIS — Q052 Lumbar spina bifida with hydrocephalus: Secondary | ICD-10-CM | POA: Diagnosis not present

## 2015-11-29 DIAGNOSIS — Z87440 Personal history of urinary (tract) infections: Secondary | ICD-10-CM | POA: Diagnosis not present

## 2015-11-29 DIAGNOSIS — R7303 Prediabetes: Secondary | ICD-10-CM | POA: Diagnosis not present

## 2015-11-29 DIAGNOSIS — N319 Neuromuscular dysfunction of bladder, unspecified: Secondary | ICD-10-CM | POA: Diagnosis not present

## 2015-11-29 DIAGNOSIS — K592 Neurogenic bowel, not elsewhere classified: Secondary | ICD-10-CM | POA: Diagnosis not present

## 2015-11-29 DIAGNOSIS — Z981 Arthrodesis status: Secondary | ICD-10-CM | POA: Diagnosis not present

## 2015-11-29 DIAGNOSIS — Z4800 Encounter for change or removal of nonsurgical wound dressing: Secondary | ICD-10-CM | POA: Diagnosis not present

## 2015-11-29 DIAGNOSIS — I10 Essential (primary) hypertension: Secondary | ICD-10-CM | POA: Diagnosis not present

## 2015-11-29 DIAGNOSIS — G808 Other cerebral palsy: Secondary | ICD-10-CM | POA: Diagnosis not present

## 2015-12-01 DIAGNOSIS — K5732 Diverticulitis of large intestine without perforation or abscess without bleeding: Secondary | ICD-10-CM | POA: Diagnosis not present

## 2015-12-01 DIAGNOSIS — Z932 Ileostomy status: Secondary | ICD-10-CM | POA: Diagnosis not present

## 2015-12-04 ENCOUNTER — Encounter (HOSPITAL_BASED_OUTPATIENT_CLINIC_OR_DEPARTMENT_OTHER): Payer: Medicare Other | Attending: Internal Medicine

## 2015-12-04 DIAGNOSIS — L89313 Pressure ulcer of right buttock, stage 3: Secondary | ICD-10-CM | POA: Diagnosis not present

## 2015-12-04 DIAGNOSIS — Q0701 Arnold-Chiari syndrome with spina bifida: Secondary | ICD-10-CM | POA: Diagnosis not present

## 2015-12-04 DIAGNOSIS — Z982 Presence of cerebrospinal fluid drainage device: Secondary | ICD-10-CM | POA: Diagnosis not present

## 2015-12-04 DIAGNOSIS — Z833 Family history of diabetes mellitus: Secondary | ICD-10-CM | POA: Diagnosis not present

## 2015-12-04 DIAGNOSIS — Z91040 Latex allergy status: Secondary | ICD-10-CM | POA: Insufficient documentation

## 2015-12-04 DIAGNOSIS — I10 Essential (primary) hypertension: Secondary | ICD-10-CM | POA: Diagnosis not present

## 2015-12-04 DIAGNOSIS — Z881 Allergy status to other antibiotic agents status: Secondary | ICD-10-CM | POA: Insufficient documentation

## 2015-12-04 DIAGNOSIS — Z8249 Family history of ischemic heart disease and other diseases of the circulatory system: Secondary | ICD-10-CM | POA: Diagnosis not present

## 2015-12-04 DIAGNOSIS — G911 Obstructive hydrocephalus: Secondary | ICD-10-CM | POA: Insufficient documentation

## 2015-12-09 DIAGNOSIS — R51 Headache: Secondary | ICD-10-CM | POA: Diagnosis not present

## 2015-12-09 DIAGNOSIS — G808 Other cerebral palsy: Secondary | ICD-10-CM | POA: Diagnosis not present

## 2015-12-09 DIAGNOSIS — Z4800 Encounter for change or removal of nonsurgical wound dressing: Secondary | ICD-10-CM | POA: Diagnosis not present

## 2015-12-09 DIAGNOSIS — R7303 Prediabetes: Secondary | ICD-10-CM | POA: Diagnosis not present

## 2015-12-09 DIAGNOSIS — Z87440 Personal history of urinary (tract) infections: Secondary | ICD-10-CM | POA: Diagnosis not present

## 2015-12-09 DIAGNOSIS — L89313 Pressure ulcer of right buttock, stage 3: Secondary | ICD-10-CM | POA: Diagnosis not present

## 2015-12-09 DIAGNOSIS — Q052 Lumbar spina bifida with hydrocephalus: Secondary | ICD-10-CM | POA: Diagnosis not present

## 2015-12-09 DIAGNOSIS — Z981 Arthrodesis status: Secondary | ICD-10-CM | POA: Diagnosis not present

## 2015-12-09 DIAGNOSIS — I10 Essential (primary) hypertension: Secondary | ICD-10-CM | POA: Diagnosis not present

## 2015-12-09 DIAGNOSIS — N319 Neuromuscular dysfunction of bladder, unspecified: Secondary | ICD-10-CM | POA: Diagnosis not present

## 2015-12-09 DIAGNOSIS — Z982 Presence of cerebrospinal fluid drainage device: Secondary | ICD-10-CM | POA: Diagnosis not present

## 2015-12-09 DIAGNOSIS — K592 Neurogenic bowel, not elsewhere classified: Secondary | ICD-10-CM | POA: Diagnosis not present

## 2015-12-17 DIAGNOSIS — Z4800 Encounter for change or removal of nonsurgical wound dressing: Secondary | ICD-10-CM | POA: Diagnosis not present

## 2015-12-17 DIAGNOSIS — I10 Essential (primary) hypertension: Secondary | ICD-10-CM | POA: Diagnosis not present

## 2015-12-17 DIAGNOSIS — G808 Other cerebral palsy: Secondary | ICD-10-CM | POA: Diagnosis not present

## 2015-12-17 DIAGNOSIS — L89313 Pressure ulcer of right buttock, stage 3: Secondary | ICD-10-CM | POA: Diagnosis not present

## 2015-12-17 DIAGNOSIS — Z87440 Personal history of urinary (tract) infections: Secondary | ICD-10-CM | POA: Diagnosis not present

## 2015-12-17 DIAGNOSIS — K592 Neurogenic bowel, not elsewhere classified: Secondary | ICD-10-CM | POA: Diagnosis not present

## 2015-12-17 DIAGNOSIS — R7303 Prediabetes: Secondary | ICD-10-CM | POA: Diagnosis not present

## 2015-12-17 DIAGNOSIS — Z981 Arthrodesis status: Secondary | ICD-10-CM | POA: Diagnosis not present

## 2015-12-17 DIAGNOSIS — Q052 Lumbar spina bifida with hydrocephalus: Secondary | ICD-10-CM | POA: Diagnosis not present

## 2015-12-17 DIAGNOSIS — N319 Neuromuscular dysfunction of bladder, unspecified: Secondary | ICD-10-CM | POA: Diagnosis not present

## 2015-12-17 DIAGNOSIS — Z982 Presence of cerebrospinal fluid drainage device: Secondary | ICD-10-CM | POA: Diagnosis not present

## 2015-12-17 DIAGNOSIS — R51 Headache: Secondary | ICD-10-CM | POA: Diagnosis not present

## 2015-12-18 ENCOUNTER — Encounter (HOSPITAL_BASED_OUTPATIENT_CLINIC_OR_DEPARTMENT_OTHER): Payer: Medicare Other | Attending: Internal Medicine

## 2015-12-18 DIAGNOSIS — Q0701 Arnold-Chiari syndrome with spina bifida: Secondary | ICD-10-CM | POA: Diagnosis not present

## 2015-12-18 DIAGNOSIS — L89313 Pressure ulcer of right buttock, stage 3: Secondary | ICD-10-CM | POA: Diagnosis not present

## 2015-12-18 DIAGNOSIS — G8220 Paraplegia, unspecified: Secondary | ICD-10-CM | POA: Diagnosis not present

## 2015-12-18 DIAGNOSIS — Z982 Presence of cerebrospinal fluid drainage device: Secondary | ICD-10-CM | POA: Diagnosis not present

## 2015-12-18 DIAGNOSIS — Q0703 Arnold-Chiari syndrome with spina bifida and hydrocephalus: Secondary | ICD-10-CM | POA: Diagnosis not present

## 2015-12-18 DIAGNOSIS — I10 Essential (primary) hypertension: Secondary | ICD-10-CM | POA: Insufficient documentation

## 2015-12-18 DIAGNOSIS — L89892 Pressure ulcer of other site, stage 2: Secondary | ICD-10-CM | POA: Insufficient documentation

## 2015-12-22 DIAGNOSIS — I10 Essential (primary) hypertension: Secondary | ICD-10-CM | POA: Diagnosis not present

## 2015-12-22 DIAGNOSIS — Z4800 Encounter for change or removal of nonsurgical wound dressing: Secondary | ICD-10-CM | POA: Diagnosis not present

## 2015-12-22 DIAGNOSIS — G808 Other cerebral palsy: Secondary | ICD-10-CM | POA: Diagnosis not present

## 2015-12-22 DIAGNOSIS — Z982 Presence of cerebrospinal fluid drainage device: Secondary | ICD-10-CM | POA: Diagnosis not present

## 2015-12-22 DIAGNOSIS — Z87440 Personal history of urinary (tract) infections: Secondary | ICD-10-CM | POA: Diagnosis not present

## 2015-12-22 DIAGNOSIS — Q052 Lumbar spina bifida with hydrocephalus: Secondary | ICD-10-CM | POA: Diagnosis not present

## 2015-12-22 DIAGNOSIS — N319 Neuromuscular dysfunction of bladder, unspecified: Secondary | ICD-10-CM | POA: Diagnosis not present

## 2015-12-22 DIAGNOSIS — L89313 Pressure ulcer of right buttock, stage 3: Secondary | ICD-10-CM | POA: Diagnosis not present

## 2015-12-22 DIAGNOSIS — K592 Neurogenic bowel, not elsewhere classified: Secondary | ICD-10-CM | POA: Diagnosis not present

## 2015-12-22 DIAGNOSIS — R51 Headache: Secondary | ICD-10-CM | POA: Diagnosis not present

## 2015-12-22 DIAGNOSIS — Z981 Arthrodesis status: Secondary | ICD-10-CM | POA: Diagnosis not present

## 2015-12-22 DIAGNOSIS — R7303 Prediabetes: Secondary | ICD-10-CM | POA: Diagnosis not present

## 2015-12-23 ENCOUNTER — Emergency Department (HOSPITAL_COMMUNITY): Payer: Medicare Other

## 2015-12-23 ENCOUNTER — Encounter (HOSPITAL_COMMUNITY): Payer: Self-pay | Admitting: Emergency Medicine

## 2015-12-23 ENCOUNTER — Observation Stay (HOSPITAL_COMMUNITY)
Admission: EM | Admit: 2015-12-23 | Discharge: 2015-12-26 | Disposition: A | Discharge status: Home Health | Payer: Medicare Other | Attending: Family Medicine | Admitting: Family Medicine

## 2015-12-23 DIAGNOSIS — Z982 Presence of cerebrospinal fluid drainage device: Secondary | ICD-10-CM | POA: Insufficient documentation

## 2015-12-23 DIAGNOSIS — Q0700 Arnold-Chiari syndrome without spina bifida or hydrocephalus: Secondary | ICD-10-CM | POA: Insufficient documentation

## 2015-12-23 DIAGNOSIS — I10 Essential (primary) hypertension: Secondary | ICD-10-CM | POA: Diagnosis not present

## 2015-12-23 DIAGNOSIS — N390 Urinary tract infection, site not specified: Principal | ICD-10-CM | POA: Diagnosis present

## 2015-12-23 DIAGNOSIS — G43909 Migraine, unspecified, not intractable, without status migrainosus: Secondary | ICD-10-CM | POA: Diagnosis not present

## 2015-12-23 DIAGNOSIS — G808 Other cerebral palsy: Secondary | ICD-10-CM | POA: Diagnosis not present

## 2015-12-23 DIAGNOSIS — Z932 Ileostomy status: Secondary | ICD-10-CM | POA: Diagnosis not present

## 2015-12-23 DIAGNOSIS — Z7401 Bed confinement status: Secondary | ICD-10-CM | POA: Diagnosis not present

## 2015-12-23 DIAGNOSIS — R509 Fever, unspecified: Secondary | ICD-10-CM | POA: Diagnosis not present

## 2015-12-23 DIAGNOSIS — L89313 Pressure ulcer of right buttock, stage 3: Secondary | ICD-10-CM | POA: Diagnosis not present

## 2015-12-23 DIAGNOSIS — A419 Sepsis, unspecified organism: Secondary | ICD-10-CM | POA: Diagnosis present

## 2015-12-23 DIAGNOSIS — N319 Neuromuscular dysfunction of bladder, unspecified: Secondary | ICD-10-CM | POA: Diagnosis present

## 2015-12-23 DIAGNOSIS — I959 Hypotension, unspecified: Secondary | ICD-10-CM

## 2015-12-23 DIAGNOSIS — R519 Headache, unspecified: Secondary | ICD-10-CM

## 2015-12-23 DIAGNOSIS — L89303 Pressure ulcer of unspecified buttock, stage 3: Secondary | ICD-10-CM | POA: Diagnosis present

## 2015-12-23 DIAGNOSIS — R51 Headache: Secondary | ICD-10-CM

## 2015-12-23 DIAGNOSIS — Z87440 Personal history of urinary (tract) infections: Secondary | ICD-10-CM | POA: Diagnosis not present

## 2015-12-23 DIAGNOSIS — B952 Enterococcus as the cause of diseases classified elsewhere: Secondary | ICD-10-CM | POA: Diagnosis not present

## 2015-12-23 DIAGNOSIS — Z993 Dependence on wheelchair: Secondary | ICD-10-CM | POA: Diagnosis not present

## 2015-12-23 DIAGNOSIS — G8220 Paraplegia, unspecified: Secondary | ICD-10-CM | POA: Diagnosis not present

## 2015-12-23 DIAGNOSIS — E876 Hypokalemia: Secondary | ICD-10-CM | POA: Diagnosis not present

## 2015-12-23 LAB — CBC WITH DIFFERENTIAL/PLATELET
BASOS PCT: 0 %
BASOS PCT: 0 %
Basophils Absolute: 0 10*3/uL (ref 0.0–0.1)
Basophils Absolute: 0 10*3/uL (ref 0.0–0.1)
EOS ABS: 0 10*3/uL (ref 0.0–0.7)
EOS ABS: 0 10*3/uL (ref 0.0–0.7)
EOS PCT: 0 %
Eosinophils Relative: 0 %
HCT: 35.2 % — ABNORMAL LOW (ref 39.0–52.0)
HCT: 31.8 % — ABNORMAL LOW (ref 39.0–52.0)
HEMOGLOBIN: 11.1 g/dL — AB (ref 13.0–17.0)
HEMOGLOBIN: 9.9 g/dL — AB (ref 13.0–17.0)
LYMPHS ABS: 0.5 10*3/uL — AB (ref 0.7–4.0)
Lymphocytes Relative: 5 %
Lymphocytes Relative: 6 %
Lymphs Abs: 0.6 10*3/uL — ABNORMAL LOW (ref 0.7–4.0)
MCH: 23.6 pg — AB (ref 26.0–34.0)
MCH: 23.5 pg — ABNORMAL LOW (ref 26.0–34.0)
MCHC: 31.5 g/dL (ref 30.0–36.0)
MCHC: 31.1 g/dL (ref 30.0–36.0)
MCV: 74.9 fL — ABNORMAL LOW (ref 78.0–100.0)
MCV: 75.4 fL — ABNORMAL LOW (ref 78.0–100.0)
MONOS PCT: 6 %
MONOS PCT: 7 %
Monocytes Absolute: 0.6 10*3/uL (ref 0.1–1.0)
Monocytes Absolute: 0.7 10*3/uL (ref 0.1–1.0)
NEUTROS PCT: 89 %
NEUTROS PCT: 87 %
Neutro Abs: 9.9 10*3/uL — ABNORMAL HIGH (ref 1.7–7.7)
Neutro Abs: 8.6 10*3/uL — ABNORMAL HIGH (ref 1.7–7.7)
PLATELETS: 184 10*3/uL (ref 150–400)
Platelets: 235 10*3/uL (ref 150–400)
RBC: 4.7 MIL/uL (ref 4.22–5.81)
RBC: 4.22 MIL/uL (ref 4.22–5.81)
RDW: 15.9 % — AB (ref 11.5–15.5)
RDW: 15.7 % — ABNORMAL HIGH (ref 11.5–15.5)
WBC: 11.1 10*3/uL — ABNORMAL HIGH (ref 4.0–10.5)
WBC: 10 10*3/uL (ref 4.0–10.5)

## 2015-12-23 LAB — COMPREHENSIVE METABOLIC PANEL
ALK PHOS: 111 U/L (ref 38–126)
ALK PHOS: 96 U/L (ref 38–126)
ALT: 71 U/L — AB (ref 17–63)
ALT: 60 U/L (ref 17–63)
ANION GAP: 11 (ref 5–15)
AST: 42 U/L — AB (ref 15–41)
AST: 37 U/L (ref 15–41)
Albumin: 3.1 g/dL — ABNORMAL LOW (ref 3.5–5.0)
Albumin: 2.6 g/dL — ABNORMAL LOW (ref 3.5–5.0)
Anion gap: 13 (ref 5–15)
BUN: 17 mg/dL (ref 6–20)
BUN: 11 mg/dL (ref 6–20)
CALCIUM: 8.8 mg/dL — AB (ref 8.9–10.3)
CALCIUM: 7.9 mg/dL — AB (ref 8.9–10.3)
CHLORIDE: 96 mmol/L — AB (ref 101–111)
CO2: 26 mmol/L (ref 22–32)
CO2: 22 mmol/L (ref 22–32)
CREATININE: 0.73 mg/dL (ref 0.61–1.24)
Chloride: 106 mmol/L (ref 101–111)
Creatinine, Ser: 0.59 mg/dL — ABNORMAL LOW (ref 0.61–1.24)
GFR calc non Af Amer: >60 mL/min (ref >60)
Glucose, Bld: 99 mg/dL (ref 65–99)
Glucose, Bld: 138 mg/dL — ABNORMAL HIGH (ref 65–99)
Potassium: 3.5 mmol/L (ref 3.5–5.1)
Potassium: 3 mmol/L — ABNORMAL LOW (ref 3.5–5.1)
SODIUM: 135 mmol/L (ref 135–145)
SODIUM: 139 mmol/L (ref 135–145)
TOTAL PROTEIN: 6.1 g/dL — AB (ref 6.5–8.1)
Total Bilirubin: 0.7 mg/dL (ref 0.3–1.2)
Total Bilirubin: 0.7 mg/dL (ref 0.3–1.2)
Total Protein: 7.2 g/dL (ref 6.5–8.1)

## 2015-12-23 LAB — LACTIC ACID, PLASMA
LACTIC ACID, VENOUS: 1.9 mmol/L (ref 0.5–2.0)
LACTIC ACID, VENOUS: 3.8 mmol/L — AB (ref 0.5–2.0)

## 2015-12-23 LAB — URINALYSIS, ROUTINE W REFLEX MICROSCOPIC
BILIRUBIN URINE: NEGATIVE
Glucose, UA: NEGATIVE mg/dL
KETONES UR: NEGATIVE mg/dL
NITRITE: POSITIVE — AB
PROTEIN: 30 mg/dL — AB
Specific Gravity, Urine: 1.026 (ref 1.005–1.030)
pH: 6 (ref 5.0–8.0)

## 2015-12-23 LAB — URINE MICROSCOPIC-ADD ON

## 2015-12-23 LAB — PROCALCITONIN: PROCALCITONIN: 1.21 ng/mL

## 2015-12-23 LAB — I-STAT CG4 LACTIC ACID, ED: Lactic Acid, Venous: 2.07 mmol/L (ref 0.5–2.0)

## 2015-12-23 MED ORDER — DEXTROSE 5 % IV SOLN
2.0000 g | Freq: Three times a day (TID) | INTRAVENOUS | Status: DC
  Administered 2015-12-23 – 2015-12-24 (×3): 2 g via INTRAVENOUS
  Filled 2015-12-23 (×6): qty 2

## 2015-12-23 MED ORDER — SODIUM CHLORIDE 0.9 % IV BOLUS (SEPSIS)
500.0000 mL | INTRAVENOUS | Status: AC
  Administered 2015-12-23: 500 mL via INTRAVENOUS

## 2015-12-23 MED ORDER — SODIUM CHLORIDE 0.9 % IV BOLUS (SEPSIS)
1000.0000 mL | Freq: Once | INTRAVENOUS | Status: AC
  Administered 2015-12-23: 1000 mL via INTRAVENOUS

## 2015-12-23 MED ORDER — ACETAMINOPHEN 325 MG PO TABS
650.0000 mg | ORAL_TABLET | Freq: Four times a day (QID) | ORAL | Status: DC | PRN
  Administered 2015-12-23 – 2015-12-24 (×3): 650 mg via ORAL
  Filled 2015-12-23 (×3): qty 2

## 2015-12-23 MED ORDER — SODIUM CHLORIDE 0.9 % IV BOLUS (SEPSIS)
1000.0000 mL | Freq: Once | INTRAVENOUS | Status: AC
Start: 2015-12-23 — End: 2015-12-24
  Administered 2015-12-23: 1000 mL via INTRAVENOUS

## 2015-12-23 MED ORDER — SODIUM CHLORIDE 0.9 % IV BOLUS (SEPSIS)
1000.0000 mL | INTRAVENOUS | Status: AC
  Administered 2015-12-23 (×2): 1000 mL via INTRAVENOUS

## 2015-12-23 MED ORDER — SODIUM CHLORIDE 0.9 % IV SOLN
INTRAVENOUS | Status: DC
  Administered 2015-12-23 – 2015-12-24 (×3): via INTRAVENOUS
  Administered 2015-12-25: 1000 mL via INTRAVENOUS

## 2015-12-23 NOTE — ED Provider Notes (Signed)
CSN: [id number]     Arrival date & time 12/23/15  1137 History   First MD Initiated Contact with Patient 12/23/15 1150     Chief Complaint  Patient presents with  . Headache  . Fever   (Consider location/radiation/quality/duration/timing/severity/associated sxs/prior Treatment) HPI 35 y.o. male with a hx of Spina Bifida, Hydrocephalus, HTN, Right Sided Ileostomy, frequent UTIs, presents to the Emergency Department today via PT[facility] due to fever and headache. States that headache began yesterday with fever within the last hour. Took Tylenol 1000mg  x 1 hour ago. No neck tenderness/pain. No CP/SOB/ABD pain. No cough. No pain currently. Patient states the headache went away with administration of Tylenol. No sick contacts. No other symptoms noted.   Has been Hospitalized in the last 90 days due to Urosepsis. Uses wheelchair.      Past Medical History  Diagnosis Date  . Hypertension   . GERD (gastroesophageal reflux disease)   . Non-healing surgical wound     ABDOMINE  . 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 Arnold-Chiari malformation ([place])   . Neurogenic bladder     INCONTINENT -  . 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 Ileostomy in place [facility])   . Wheelchair bound     CAN TRASFER SELF WITHOUT BOARD  . Urinary incontinence with continuous leakage   . Wears glasses   . Pressure ulcer, buttock   . Borderline diabetes     DIET CONTROLLED  . Heart palpitations     SECONDARY TO VP SHUNT DRAIN  . History of seizure as newborn     NO MEDS SINCE 35 YRS OLD--  NO ISSUES SINCE  . Neurogenic bowel   . At risk for sleep apnea     STOP-BANG = 4   SENT TO PCP 11-01-2013   Past Surgical History  Procedure Laterality Date  . Ventriculoperitoneal shunt  LAST REVISION NOV 2014    DUE TO ABD WOUND--- SHUNT DRAINS TO HEART  . Hip surgery Bilateral AS CHILD    TENDON RELEASE  .  Cholecystectomy N/A 07/06/2013    Procedure: LAPAROSCOPIC CHOLECYSTECTOMY WITH INTRAOPERATIVE CHOLANGIOGRAM;  Surgeon: Madilyn Hook, DO;  Location: [facility];  Service: General;  Laterality: N/A;  . Colon resection N/A 07/08/2013    Procedure: EXPLORATORY LAPAROSCOPY, DIAGNOSTIC LAPAROSCOPY, PARTIAL COLECTOMY, ABDOMINAL WASHOUT, ABDOMINAL WOUND VAC;  Surgeon: Madilyn Hook, DO;  Location: [facility];  Service: General;  Laterality: N/A;  . Spinal fixation surgery w/ implant  AS CHILD    [place] R[facility]  . 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 Umbilical hernia repair  03-30-2007  . Right colectomy/  appendectomy/  ileostomy  NOV 2014  [facility]  . Incision and drainage of wound N/A 11/04/2013    Procedure: IRRIGATION AND DEBRIDEMENT ABDOMINAL WOUND WITH PLACEMENT OF ACELL/VAC;  Surgeon: Theodoro Kos, DO;  Location: [facility];  Service: Plastics;  Laterality: N/A;   Family History  Problem Relation Age of Onset  . Diabetes Other   . Hypertension Other   . Cancer Other   . Stroke Other    Social History  Substance Use Topics  . Smoking status: Never Smoker   . Smokeless tobacco: Never Used  . Alcohol Use: No    Review of Systems ROS reviewed and all are negative for acute change except as noted in the HPI.  Allergies  Latex; Succinylcholine; Ciprofloxacin; Vancomycin; and Adhesive  Home Medications   Prior to Admission medications   Medication Sig Start Date End Date Taking? Authorizing Provider  acetaminophen (TYLENOL) 650 MG CR tablet Take 1,300 mg by mouth every 8 (eight) hours as needed for pain.    Historical Provider, MD  cephALEXin (KEFLEX) 500 MG capsule Take 1 capsule (500 mg total) by mouth every 8 (eight) hours. 11/23/15   Shanker Kristeen Mans, MD  Cholecalciferol (VITAMIN D3) 2000 units capsule Take 2,000 Units by mouth daily.    Historical Provider, MD  collagenase (SANTYL) ointment Apply 1 application topically daily as needed (for wound care).   05/13/15   Historical Provider, MD  Cranberry 400 MG CAPS Take 4,200 mg by mouth every morning.     Historical Provider, MD  metoprolol (LOPRESSOR) 50 MG tablet Take 50 mg by mouth 2 (two) times daily. 10/31/15   Historical Provider, MD  Multiple Vitamin (MULTIVITAMIN) tablet Take 1 tablet by mouth daily.    Historical Provider, MD  ondansetron (ZOFRAN) 4 MG tablet Take 4 mg by mouth every 8 (eight) hours as needed for nausea or vomiting.    Historical Provider, MD  oxyCODONE (OXY IR/ROXICODONE) 5 MG immediate release tablet Take 1 tablet (5 mg total) by mouth every 6 (six) hours as needed. 1 by mouth every 3 hours as needed for MILD pain, take 2 by mouth every 3 hours as needed for MODERATE pain, 3 by mouth every 3 hours as needed for SEVERE pain 10/30/14   Reyne Dumas, MD  potassium chloride 20 MEQ TBCR Take 20 mEq by mouth daily. Patient taking differently: Take 20 mEq by mouth 2 (two) times daily.  10/30/14   Reyne Dumas, MD  verapamil (CALAN-SR) 120 MG CR tablet Take 120 mg by mouth at bedtime. 11/08/15   Historical Provider, MD   BP 94/61 mmHg  Pulse 108  Temp(Src) 102.4 F (39.1 C) (Oral)  Resp 20  SpO2 96%   Physical Exam  Constitutional: He is oriented to person, place, and time. He appears well-developed and well-nourished.  HENT:  Head: Normocephalic and atraumatic.  Eyes: EOM are normal. Pupils are equal, round, and reactive to light.  Neck: Normal range of motion. Neck supple. No tracheal deviation present.  Cardiovascular: Regular rhythm, normal heart sounds and intact distal pulses.  Tachycardia present.   No murmur heard. Pulmonary/Chest: Effort normal and breath sounds normal. No respiratory distress. He has no wheezes. He has no rales. He exhibits no tenderness.  Abdominal: Soft. Bowel sounds are normal. There is no tenderness.  Surgical scars noted from previous resection   Musculoskeletal: Normal range of motion.  Neurological: He is alert and oriented to person, place,  and time. He has normal strength. No cranial nerve deficit or sensory deficit. GCS eye subscore is 4. GCS verbal subscore is 5. GCS motor subscore is 6.  Skin: Skin is warm and dry.  Psychiatric: He has a normal mood and affect. His behavior is normal. Thought content normal.  Nursing note and vitals reviewed.  ED Course  Procedures (including critical care time) Labs Review Labs Reviewed  COMPREHENSIVE METABOLIC PANEL - Abnormal; Notable for the following:    Chloride 96 (*)    Calcium 8.8 (*)    Albumin 3.1 (*)    AST 42 (*)    ALT 71 (*)    All other components within normal limits  CBC WITH DIFFERENTIAL/PLATELET - Abnormal; Notable for the following:    WBC  11.1 (*)    Hemoglobin 11.1 (*)    HCT 35.2 (*)    MCV 74.9 (*)    MCH 23.6 (*)    RDW 15.9 (*)    Neutro Abs 9.9 (*)    Lymphs Abs 0.5 (*)    All other components within normal limits  URINALYSIS, ROUTINE W REFLEX MICROSCOPIC (NOT AT [facility]) - Abnormal; Notable for the following:    APPearance TURBID (*)    Hgb urine dipstick SMALL (*)    Protein, ur 30 (*)    Nitrite POSITIVE (*)    Leukocytes, UA LARGE (*)    All other components within normal limits  CBC WITH DIFFERENTIAL/PLATELET - Abnormal; Notable for the following:    Hemoglobin 9.9 (*)    HCT 31.8 (*)    MCV 75.4 (*)    MCH 23.5 (*)    RDW 15.7 (*)    Neutro Abs 8.6 (*)    Lymphs Abs 0.6 (*)    All other components within normal limits  URINE MICROSCOPIC-ADD ON - Abnormal; Notable for the following:    Squamous Epithelial / LPF 6-30 (*)    Bacteria, UA MANY (*)    All other components within normal limits  I-STAT CG4 LACTIC ACID, ED - Abnormal; Notable for the following:    Lactic Acid, Venous 2.07 (*)    All other components within normal limits  CULTURE, BLOOD (ROUTINE X 2)  CULTURE, BLOOD (ROUTINE X 2)  URINE CULTURE  I-STAT CG4 LACTIC ACID, ED   Imaging Review Dg Skull 1-3 Views  12/23/2015  CLINICAL DATA:  Headache.  Ventricular peritoneal  shunt series. EXAM: SKULL - 1-3 VIEW COMPARISON:  Radiographs 07/07/2015.  CT 07/22/2015. FINDINGS: Right-sided ventriculoperitoneal shunt appears intact within the right neck as well as the intracranial portion. There is an old broken ventriculoperitoneal shunt on the right which is also unchanged. No acute findings are seen. IMPRESSION: The right-sided ventriculoperitoneal shunt appears intact within the head and neck. Old shunt tubing remains. Electronically Signed   By: Richardean Sale M.D.   On: 12/23/2015 14:09   Dg Chest 1 View  12/23/2015  CLINICAL DATA:  Headache.  Ventricular peritoneal shunt series. EXAM: CHEST 1 VIEW COMPARISON:  12/23/2015 and 07/07/2015. FINDINGS: Right-sided shunt extending to the level of the SVC is intact and unchanged. There is old discontinuous shunt tubing over the right chest, terminating over the right upper quadrant of the abdomen. Chest wall deformity status post spinal fusion appears unchanged. The lungs are clear. IMPRESSION: The thoracic portions of the shunt are intact, terminating over the level of the SVC. Old shunt tubing remains. Electronically Signed   By: Richardean Sale M.D.   On: 12/23/2015 14:12   Dg Cervical Spine 1 View  12/23/2015  CLINICAL DATA:  Ventriculoperitoneal shunt. EXAM: CERVICAL SPINE 1 VIEW COMPARISON:  None. FINDINGS: There is evidence of an old broken ventriculoperitoneal shunt in the right cervical region. There also appears to be an intact ventriculoperitoneal shunt in the right cervical region. IMPRESSION: See above. Electronically Signed   By: Marijo Conception, M.D.   On: 12/23/2015 14:03   Dg Abd 1 View  12/23/2015  CLINICAL DATA:  Headache.  Ventricular peritoneal shunt series. EXAM: ABDOMEN - 1 VIEW COMPARISON:  Radiographs 07/22/2015 and 11/06/2014. FINDINGS: Old shunt tubing overlies the right hemidiaphragm, unchanged from the previous study. No other shunt tubing identified within the abdomen. The visualized bowel gas pattern is  normal. There are postsurgical changes status post  thoracolumbar fusion. The left spinal rod is chronically fractured and appears unchanged. IMPRESSION: Stable radiographic appearance of the abdomen. There is no functional shunt tubing in the abdomen. Electronically Signed   By: Richardean Sale M.D.   On: 12/23/2015 14:07   Ct Head Wo Contrast  12/23/2015  CLINICAL DATA:  Headache.  Evaluate shunt EXAM: CT HEAD WITHOUT CONTRAST TECHNIQUE: Contiguous axial images were obtained from the base of the skull through the vertex without intravenous contrast. COMPARISON:  07/22/2015 FINDINGS: Right parietal shunt again noted in place, extending across the midline into the frontal horn of the left lateral ventricle. No hydrocephalus. No acute infarction or hemorrhage. Partially genesis of the corpus callosum noted. Chiari 1 malformation noted. Mild mucosal thickening in the paranasal sinuses. Mastoid air cells are clear. IMPRESSION: VP shunt remains in stable position.  No hydrocephalus. Electronically Signed   By: Rolm Baptise M.D.   On: 12/23/2015 14:47   Dg Chest Port 1 View  12/23/2015  CLINICAL DATA:  Fever.  History of spinal bifida with hydrocephalus. EXAM: PORTABLE CHEST 1 VIEW COMPARISON:  11/20/2015; 07/22/2015; 07/07/2015 FINDINGS: Grossly unchanged cardiac silhouette and mediastinal contours. Lung volumes remain reduced. No focal airspace opacities. No pleural effusion or pneumothorax. No evidence of edema. Ventriculoperitoneal catheter tubing courses along the right midclavicular line. Post long segment thoracolumbar spine paraspinal fusion. The left-sided paraspinal rods again noted to be fractured at its mid aspect similar to the 07/07/2015 examination IMPRESSION: Persistent findings of hypoventilation without acute cardiopulmonary disease. Specifically, no evidence of pneumonia. Electronically Signed   By: Sandi Mariscal M.D.   On: 12/23/2015 13:01   I have personally reviewed and evaluated these images  and lab results as part of my medical decision-making.   EKG Interpretation None     MDM  I have reviewed and evaluated the relevant laboratory values.I have reviewed and evaluated the relevant imaging studies.I personally evaluated and interpreted the relevant EKG.I have reviewed the relevant previous healthcare records.I have reviewed EMS Documentation.I obtained HPI from historian. Patient discussed with supervising physician  ED Course:  Assessment: Pt is a 36yM with hx hx of Spina Bifida, Hydrocephalus, HTN, Right Sided Ileostomy, frequent UTIs, who presents with headache and fever since yesterday. On exam, pt in NAD. Nontoxic/nonseptic appearing. VS show tachycardia. Not hypoxic. Soft BP. Febrile 102.31F. Code Sepsis initiated. Made NPO. Lungs CTA. Heart RRR. Abdomen nontender soft. iStat Lactate 2.07. WBC 11.1. CT Head and Shunt Series unremarkable. Appears intact. Headache went away with administration of Tylenol. Most likely UTI related as UA is positive. Given Fluids as per Sepsis protocol in ED. Started on Cefepime for possible Urosepsis. Plan is to Admit due to infection as well as soft BPs.   Disposition/Plan:  Admit Pt acknowledges and agrees with plan  Supervising Physician Gareth Morgan, MD   Final diagnoses:  Headache  UTI (lower urinary tract infection)    Shary Decamp, PA-C 12/23/15 [place], MD 12/24/15 [phone]

## 2015-12-23 NOTE — ED Notes (Signed)
Pt to ER via PTAR for evaluation of headache that began yesterday and fever within the last hour; pt also reports decrease in appetite and water intake. Pt has hx of spina bifida, hydrocephalus, and HTN. Pt is alert and oriented x4. VS - 124/70, HR 100 and irregular, RR 16, 96% RA.

## 2015-12-23 NOTE — Progress Notes (Signed)
Pharmacy Code Sepsis Protocol  Time of code sepsis page: 1252  []  Antibiotics delivered at 1305 []  Antibiotics administered prior to code at  (if checked, omit next 2 questions)  Were antibiotics ordered at the time of the code sepsis page? No Was it required to contact the physician? [x]  Physician not contacted []  Physician contacted to order antibiotics for code sepsis []  Physician contacted to recommend changing antibiotics  Pharmacy consulted for: NA  Anti-infectives    Start     Dose/Rate Route Frequency Ordered Stop   12/23/15 1300  ceFEPIme (MAXIPIME) 2 g in dextrose 5 % 50 mL IVPB     2 g 100 mL/hr over 30 Minutes Intravenous 3 times per day 12/23/15 1258          Nurse education provided: []  Minutes left to administer antibiotics to achieve 1 hour goal []  Correct order of antibiotic administration []  Antibiotic Y-site compatibilities     Darionna Banke, Jake Church, PharmD 12/23/2015, 1:06 PM

## 2015-12-23 NOTE — H&P (Signed)
[facility] History and Physical  [name] [id number] DOB: 1981-01-27 DOA: 12/23/2015  Referring physician: Emergency Department PCP: Elyn Peers, MD   CHIEF COMPLAINT:   fever               HPI: [name] is a 35 y.o. male with spina bifida, hydrocephalus, and HTN. He lives with mother who is primary caregiver.   Patient was hospitalized early March for sepsis secondary to E.coli UTI.   He has a history of recurrent UTI. Discharged home on keflex.   Patient presented to ED today for fevers at home. He has no complaints of pain except for a headache over last couple of days. Patient has a history of migraines, followed by Neurology. No abdominal pain. No dysuria. Mother denies foul urine. Ileostomy output unchanged. No acute findings on CXR.  ED workup:   In ED temp 102.4, HR 108, BP 94/61.  Sats upper 90s on r/a.  Lactic acid 2.07, WBC 11.1. Hgb 9.0, down from 11.5 Mid March.   EKG:    Sinus tachycardia Probable left atrial enlargement                 Medications  ceFEPIme (MAXIPIME) 2 g in dextrose 5 % 50 mL IVPB (0 g Intravenous Stopped 12/23/15 1342)  sodium chloride 0.9 % bolus 1,000 mL (0 mLs Intravenous Stopped 12/23/15 1312)  sodium chloride 0.9 % bolus 1,000 mL (0 mLs Intravenous Stopped 12/23/15 1606)    Followed by  sodium chloride 0.9 % bolus 500 mL (0 mLs Intravenous Stopped 12/23/15 1633)    Review of Systems  Constitutional: Positive for fever.  Eyes: Negative.   Respiratory: Negative.   Cardiovascular: Negative.   Gastrointestinal: Negative.   Genitourinary: Negative.   Musculoskeletal: Negative.   Skin: Negative.   Neurological: Positive for headaches.  Endo/Heme/Allergies: Negative.   Psychiatric/Behavioral: Negative.     Past Medical History  Diagnosis Date  . Hypertension   . GERD (gastroesophageal reflux disease)   . Non-healing surgical wound     ABDOMINE  . 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 Arnold-Chiari malformation ([place])   . Neurogenic bladder     INCONTINENT -  . 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 Ileostomy in place [facility])   . Wheelchair bound     CAN TRASFER SELF WITHOUT BOARD  . Urinary incontinence with continuous leakage   . Wears glasses   . Pressure ulcer, buttock   . Borderline diabetes     DIET CONTROLLED  . Heart palpitations     SECONDARY TO VP SHUNT DRAIN  . History of seizure as newborn     NO MEDS SINCE 35 YRS OLD--  NO ISSUES SINCE  . Neurogenic bowel   . At risk for sleep apnea     STOP-BANG = 4   SENT TO PCP 11-01-2013   Past Surgical History  Procedure Laterality Date  . Ventriculoperitoneal shunt  LAST REVISION NOV 2014    DUE TO ABD WOUND--- SHUNT DRAINS TO HEART  . Hip surgery Bilateral AS CHILD    TENDON RELEASE  . Cholecystectomy N/A 07/06/2013    Procedure: LAPAROSCOPIC CHOLECYSTECTOMY WITH INTRAOPERATIVE CHOLANGIOGRAM;  Surgeon: Madilyn Hook, DO;  Location: [facility];  Service: General;  Laterality: N/A;  . Colon resection N/A 07/08/2013    Procedure: EXPLORATORY LAPAROSCOPY, DIAGNOSTIC LAPAROSCOPY, PARTIAL COLECTOMY,  ABDOMINAL WASHOUT, ABDOMINAL WOUND VAC;  Surgeon: Madilyn Hook, DO;  Location: [facility];  Service: General;  Laterality: N/A;  . Spinal fixation surgery w/ implant  AS CHILD    [place] R[facility]S  . 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 Umbilical hernia repair  03-30-2007  . Right colectomy/  appendectomy/  ileostomy  NOV 2014  [facility]  . Incision and drainage of wound N/A 11/04/2013    Procedure: IRRIGATION AND DEBRIDEMENT ABDOMINAL WOUND WITH PLACEMENT OF ACELL/VAC;  Surgeon: Theodoro Kos, DO;  Location: [facility];  Service: Plastics;  Laterality: N/A;    SOCIAL HISTORY:  reports that he has never smoked. He has never used smokeless tobacco. He reports that he does not drink alcohol or use illicit  drugs. Lives:  At home with mother    Assistive devices:   None needed for ambulation.   Allergies  Allergen Reactions  . Latex Hives  . Succinylcholine Rash    Other reaction(s): Other (See Comments) Pt is half [place] - prolonged paralysis due to compromised metabolism. Succinylcholine given on 07/07/15 at outside hospital and pt experienced paralysis > 3 hours.  Due to being native [place], his mother states that he takes longer to "come out of it"  . Ciprofloxacin Other (See Comments)    IV only-- caused burning in arm   . Vancomycin Itching and Swelling  . Adhesive [Tape] Hives and Rash    Family History  Problem Relation Age of Onset  . Diabetes Other   . Hypertension Other   . Cancer Other   . Stroke Other     Prior to Admission medications   Medication Sig Start Date End Date Taking? Authorizing Provider  acetaminophen (TYLENOL) 650 MG CR tablet Take 1,300 mg by mouth every 8 (eight) hours as needed for pain.   Yes Historical Provider, MD  Cholecalciferol (VITAMIN D3) 2000 units capsule Take 2,000 Units by mouth daily.   Yes Historical Provider, MD  Cranberry 400 MG CAPS Take 4,200 mg by mouth every morning.    Yes Historical Provider, MD  metoprolol (LOPRESSOR) 50 MG tablet Take 50 mg by mouth 2 (two) times daily. 10/31/15  Yes Historical Provider, MD  Multiple Vitamin (MULTIVITAMIN) tablet Take 1 tablet by mouth daily.   Yes Historical Provider, MD  ondansetron (ZOFRAN) 4 MG tablet Take 4 mg by mouth every 8 (eight) hours as needed for nausea or vomiting.   Yes Historical Provider, MD  oxyCODONE (OXY IR/ROXICODONE) 5 MG immediate release tablet Take 1 tablet (5 mg total) by mouth every 6 (six) hours as needed. 1 by mouth every 3 hours as needed for MILD pain, take 2 by mouth every 3 hours as needed for MODERATE pain, 3 by mouth every 3 hours as needed for SEVERE pain 10/30/14  Yes Reyne Dumas, MD  potassium chloride 20 MEQ TBCR Take 20 mEq by mouth daily. 10/30/14  Yes  Reyne Dumas, MD  verapamil (CALAN-SR) 120 MG CR tablet Take 120 mg by mouth at bedtime. 11/08/15  Yes Historical Provider, MD   PHYSICAL EXAM: Filed Vitals:   12/23/15 1245 12/23/15 1300 12/23/15 1359 12/23/15 1545  BP: 101/56 109/56  106/58  Pulse: 93 94  96  Temp:      TempSrc:      Resp: 22 19  23   Weight:   65.772 kg (145 lb)   SpO2: 97% 98%  100%    Wt Readings from Last 3 Encounters:  12/23/15  65.772 kg (145 lb)  11/23/15 69.174 kg (152 lb 8 oz)  07/22/15 67.1 kg (147 lb 14.9 oz)    General:  Pleasant white male. Appears calm and comfortable Eyes: PER, normal lids, irises & conjunctiva ENT: grossly normal hearing, lips & tongue Neck: no LAD, no masses Cardiovascular: RRR, no murmurs. No LE edema.  Respiratory: Respirations even and unlabored. Normal respiratory effort. Lungs CTA bilaterally, no wheezes / rales .   Abdomen: soft, non-distended, non-tender, active bowel sounds. No obvious masses.  Skin: no rash seen on limited exam. Inner right buttock erythematous  Musculoskeletal: grossly normal tone BUE/BLE Psychiatric: grossly normal mood and affect, speech fluent and appropriate Neurologic: grossly non-focal.         LABS ON ADMISSION:    Basic Metabolic Panel:  Recent Labs Lab 12/23/15 1218  NA 135  K 3.5  CL 96*  CO2 26  GLUCOSE 99  BUN 17  CREATININE 0.73  CALCIUM 8.8*   Liver Function Tests:  Recent Labs Lab 12/23/15 1218  AST 42*  ALT 71*  ALKPHOS 111  BILITOT 0.7  PROT 7.2  ALBUMIN 3.1*    CBC:  Recent Labs Lab 12/23/15 1218 12/23/15 1310  WBC 11.1* 10.0  NEUTROABS 9.9* 8.6*  HGB 11.1* 9.9*  HCT 35.2* 31.8*  MCV 74.9* 75.4*  PLT 184 235    CREATININE: 0.73 (12/23/15 1218) Estimated creatinine clearance - 105.6 mL/min  Radiological Exams on Admission: Dg Skull 1-3 Views  12/23/2015  CLINICAL DATA:  Headache.  Ventricular peritoneal shunt series. EXAM: SKULL - 1-3 VIEW COMPARISON:  Radiographs 07/07/2015.  CT 07/22/2015.  FINDINGS: Right-sided ventriculoperitoneal shunt appears intact within the right neck as well as the intracranial portion. There is an old broken ventriculoperitoneal shunt on the right which is also unchanged. No acute findings are seen. IMPRESSION: The right-sided ventriculoperitoneal shunt appears intact within the head and neck. Old shunt tubing remains. Electronically Signed   By: Richardean Sale M.D.   On: 12/23/2015 14:09   Dg Chest 1 View  12/23/2015  CLINICAL DATA:  Headache.  Ventricular peritoneal shunt series. EXAM: CHEST 1 VIEW COMPARISON:  12/23/2015 and 07/07/2015. FINDINGS: Right-sided shunt extending to the level of the SVC is intact and unchanged. There is old discontinuous shunt tubing over the right chest, terminating over the right upper quadrant of the abdomen. Chest wall deformity status post spinal fusion appears unchanged. The lungs are clear. IMPRESSION: The thoracic portions of the shunt are intact, terminating over the level of the SVC. Old shunt tubing remains. Electronically Signed   By: Richardean Sale M.D.   On: 12/23/2015 14:12   Dg Cervical Spine 1 View  12/23/2015  CLINICAL DATA:  Ventriculoperitoneal shunt. EXAM: CERVICAL SPINE 1 VIEW COMPARISON:  None. FINDINGS: There is evidence of an old broken ventriculoperitoneal shunt in the right cervical region. There also appears to be an intact ventriculoperitoneal shunt in the right cervical region. IMPRESSION: See above. Electronically Signed   By: Marijo Conception, M.D.   On: 12/23/2015 14:03   Dg Abd 1 View  12/23/2015  CLINICAL DATA:  Headache.  Ventricular peritoneal shunt series. EXAM: ABDOMEN - 1 VIEW COMPARISON:  Radiographs 07/22/2015 and 11/06/2014. FINDINGS: Old shunt tubing overlies the right hemidiaphragm, unchanged from the previous study. No other shunt tubing identified within the abdomen. The visualized bowel gas pattern is normal. There are postsurgical changes status post thoracolumbar fusion. The left spinal  rod is chronically fractured and appears unchanged. IMPRESSION: Stable radiographic appearance of  the abdomen. There is no functional shunt tubing in the abdomen. Electronically Signed   By: Richardean Sale M.D.   On: 12/23/2015 14:07   Ct Head Wo Contrast  12/23/2015  CLINICAL DATA:  Headache.  Evaluate shunt EXAM: CT HEAD WITHOUT CONTRAST TECHNIQUE: Contiguous axial images were obtained from the base of the skull through the vertex without intravenous contrast. COMPARISON:  07/22/2015 FINDINGS: Right parietal shunt again noted in place, extending across the midline into the frontal horn of the left lateral ventricle. No hydrocephalus. No acute infarction or hemorrhage. Partially genesis of the corpus callosum noted. Chiari 1 malformation noted. Mild mucosal thickening in the paranasal sinuses. Mastoid air cells are clear. IMPRESSION: VP shunt remains in stable position.  No hydrocephalus. Electronically Signed   By: Rolm Baptise M.D.   On: 12/23/2015 14:47   Dg Chest Port 1 View  12/23/2015  CLINICAL DATA:  Fever.  History of spinal bifida with hydrocephalus. EXAM: PORTABLE CHEST 1 VIEW COMPARISON:  11/20/2015; 07/22/2015; 07/07/2015 FINDINGS: Grossly unchanged cardiac silhouette and mediastinal contours. Lung volumes remain reduced. No focal airspace opacities. No pleural effusion or pneumothorax. No evidence of edema. Ventriculoperitoneal catheter tubing courses along the right midclavicular line. Post long segment thoracolumbar spine paraspinal fusion. The left-sided paraspinal rods again noted to be fractured at its mid aspect similar to the 07/07/2015 examination IMPRESSION: Persistent findings of hypoventilation without acute cardiopulmonary disease. Specifically, no evidence of pneumonia. Electronically Signed   By: Sandi Mariscal M.D.   On: 12/23/2015 13:01    ASSESSMENT / PLAN   Sepsis, likely secondary to recurrent UTI. Less likely related to decubitus ulcer -Patient stable. Will admit to  Observation - Medical bed -Sepsis order set utilized -hold home BP meds due to hypotension -Fluid resuscitation by body weight completed in ED -follow up on urine and blood cultures.  -trend lactic acid -Continue maxipime started in ED -Possibly home tomorrow on oral antibiotics.   Spina Bifida. Mother is primary caregiver.   Hypertension.  -Hold home Lopressor and Calan given hypotension.  Decubitus ulcer, right ischium. Evaluated by [facility] during recent admission and ulcer was stage 3 at that time. . Home Health Nurse and mother doing dressing changes.  -WOC consult  Ileostomy secondary to colon perforation following cholecystectomy   CONSULTANTS:    None  Code Status: full code DVT Prophylaxis: Lovenox  Family Communication:  Patient alert, oriented and understands plan of care.  Disposition Plan: Discharge to home in 24-48 hours   Time spent: 60 minutes Tye Savoy  NP [facility] Pager [phone]

## 2015-12-23 NOTE — ED Notes (Signed)
Iv infusion ended  stopped

## 2015-12-24 DIAGNOSIS — L89303 Pressure ulcer of unspecified buttock, stage 3: Secondary | ICD-10-CM | POA: Diagnosis not present

## 2015-12-24 DIAGNOSIS — I952 Hypotension due to drugs: Secondary | ICD-10-CM | POA: Diagnosis not present

## 2015-12-24 DIAGNOSIS — I10 Essential (primary) hypertension: Secondary | ICD-10-CM | POA: Diagnosis not present

## 2015-12-24 DIAGNOSIS — N390 Urinary tract infection, site not specified: Secondary | ICD-10-CM | POA: Diagnosis not present

## 2015-12-24 LAB — LACTIC ACID, PLASMA: Lactic Acid, Venous: 1.4 mmol/L (ref 0.5–2.0)

## 2015-12-24 MED ORDER — DEXTROSE 5 % IV SOLN
2.0000 g | INTRAVENOUS | Status: DC
  Administered 2015-12-25: 2 g via INTRAVENOUS
  Filled 2015-12-24: qty 2

## 2015-12-24 MED ORDER — VERAPAMIL HCL ER 120 MG PO TBCR
60.0000 mg | EXTENDED_RELEASE_TABLET | Freq: Every day | ORAL | Status: DC
  Administered 2015-12-24 – 2015-12-25 (×2): 60 mg via ORAL
  Filled 2015-12-24 (×3): qty 0.5

## 2015-12-24 MED ORDER — CEFTRIAXONE SODIUM 1 G IJ SOLR
1.0000 g | INTRAMUSCULAR | Status: DC
  Administered 2015-12-24: 1 g via INTRAVENOUS
  Filled 2015-12-24: qty 10

## 2015-12-24 MED ORDER — POTASSIUM CHLORIDE CRYS ER 20 MEQ PO TBCR
20.0000 meq | EXTENDED_RELEASE_TABLET | Freq: Every day | ORAL | Status: DC
  Administered 2015-12-24: 20 meq via ORAL
  Filled 2015-12-24: qty 1

## 2015-12-24 MED ORDER — ENOXAPARIN SODIUM 40 MG/0.4ML ~~LOC~~ SOLN
40.0000 mg | SUBCUTANEOUS | Status: DC
  Administered 2015-12-24 – 2015-12-25 (×2): 40 mg via SUBCUTANEOUS
  Filled 2015-12-24 (×2): qty 0.4

## 2015-12-24 MED ORDER — VERAPAMIL HCL ER 120 MG PO TBCR: 120.0000 mg | EXTENDED_RELEASE_TABLET | Freq: Every day | ORAL | Status: DC

## 2015-12-24 NOTE — Care Management Obs Status (Signed)
[place] NOTIFICATION   Patient Details  Name: [name] MRN: [id number] Date of Birth: July 19, 1981   Medicare Observation Status Notification Given:  Yes    Sharin Mons, RN 12/24/2015, 8:23 AM

## 2015-12-24 NOTE — Consult Note (Addendum)
WOC wound consult note Reason for Consult:Chronic right ischium wound. Mother stated she dresses wound with Aquacel Ag. Had an air mattress but same was discontinued after wound healed now wound is open again. Wound type: Stage 3 pressure injury Pressure Ulcer POA: Yes Measurement:2.5 cm x 1 cm x 0.2 cm Wound bed: 100 % red  Drainage (amount, consistency, odor) scant amount of serosanguinous, no odor  Periwound: intact surrounding skin Dressing procedure/placement/frequency: Cover wound with foam dressing to provide a moist healing environment. Change every five days and prn if soiled. Patient has a RLQ ileostomy in place (2 piece) and is managed by his mother. She stated she has no peristomal skin issues at this time. Last bag change was Tuesday of this week. Air replacement mattress ordered for pressure reduction.    Re consult if needed, will not follow at this time. Thanks, Melba Coon MSN, RN, [employer]

## 2015-12-24 NOTE — Progress Notes (Signed)
PROGRESS NOTE    [name]  [id number] DOB: 02/17/1981 DOA: 12/23/2015 PCP: Elyn Peers, MD  Outpatient Specialists:  [facility]   Brief Narrative:  35 y/o ? history of spina bifida status post multipleShunts/reservoirs-managed [facility] Dr. Marygrace Drought group-- last office visit October 2016 where shunt was replaced Chr ileotomy since age16 Functional paraplegia and urinary incontinence followed by [facility] Baseline able to transfer and do some ADL and IADL activities but limited by lowe extremity plegia Prior sepsis 11/2015  ED temp 102.4, HR 108, BP 94/61.  Sats upper 90s on r/a.   Lactic acid 2.07, WBC 11.1. Hgb 9.0, down from 11.5 Mid March.    Hypotension--held verapamil/toprol on admit    Assessment & Plan:   Active Problems:   Neurogenic bladder-Etiology likely secondary to functional paraplegia. See below discussion   Sepsis secondary to Pyelonephritis-Patient will need empiric coverage with IV antibiotics and patient will need empiric coverage with IV antibiotics. I will change cefepime to ceftriaxone. We will follow urine culture that was performed.  Patient had an elevated lactic acid which is now trending down and we will continue saline at 75 cc per hour.   Decubitus ulcer of buttock, stage 3 (HCC)--Patient has a clean circumscribed right gluteal wound that is not losing. This is not the source of sepsis.    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 Patient was seen in October 2016 and had the shunt replaced. There is a remnant of the old shunt left in situ. This should be followed up as an outpatient by [facility]. There is no contributor of these issues to his current illness.   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 Blood pressures are too low to support this at present  time.   Hypokalemia-K = 3.0replace orally K Dur 20 mEq    Transaminitis on admission 12/23/2015-    Chronic migraine-patient is on verapamil for suppressive therapy of migraines. His blood pressure will not support the dose of one 20 mg CR therefore we will cut this in half to 60 mg CR and give today.     DVT prophylaxis: Lovenox Code Status: (Full Family Communication: 15 minute discussion with mother. Understands clearly may require outpatient neurology input Disposition Plan: To be discussed-likely discharge back to home in 2-3 days   Procedures:     Antimicrobials:   Cefepime-->ceftriaxone 12/24/15    Subjective:  Looks better, feels better compared to prior No other issues overall Tolerating diet No pain No chills No fever   Objective: Filed Vitals:   12/23/15 1720 12/24/15 0003 12/24/15 0553 12/24/15 0800  BP: 106/67 101/48 105/53   Pulse: 114 106 102   Temp: 99.2 F (37.3 C) 98.2 F (36.8 C) 98.7 F (37.1 C) 98.2 F (36.8 C)  TempSrc: Oral Oral Oral Oral  Resp: 20 18 18    Weight:      SpO2: 99% 100% 99%     Intake/Output Summary (Last 24 hours) at 12/24/15 0842 Last data filed at 12/24/15 0654  Gross per 24 hour  Intake   2820 ml  Output   2100 ml  Net    720 ml   Filed Weights   12/23/15 1359  Weight: 65.772 kg (145 lb)    Examination:  General exam: Appears calm and comfortable  Respiratory system: Clear to auscultation. Respiratory effort normal. Cardiovascular system: S1 & S2 heard, RRR. No JVD,  murmurs, rubs, gallops or clicks. No pedal edema. Gastrointestinal system: Abdomen Has a ileostomy in the right middle quadrant Central nervous system: Alert and oriented. Sensory as well as motor exam is limited in the lower extremity with his functional paraplegia. He is able to track vision with his eyes without any deficit and has good strength in his biceps triceps and forearms as well as shoulders Extremities: Symmetric 5 x 5 power. Skin: 3  x 3 cm clean-based ulcer in the right buttock Psychiatry: Judgement and insight appear normal. Mood & affect appropriate.     Data Reviewed: I have personally reviewed following labs and imaging studies  CBC:  Recent Labs Lab 12/23/15 1218 12/23/15 1310  WBC 11.1* 10.0  NEUTROABS 9.9* 8.6*  HGB 11.1* 9.9*  HCT 35.2* 31.8*  MCV 74.9* 75.4*  PLT 184 [id number]   Basic Metabolic Panel:  Recent Labs Lab 12/23/15 1218 12/23/15 1759  NA 135 139  K 3.5 3.0*  CL 96* 106  CO2 26 22  GLUCOSE 99 138*  BUN 17 11  CREATININE 0.73 0.59*  CALCIUM 8.8* 7.9*   GFR: Estimated Creatinine Clearance: 105.6 mL/min (by C-G formula based on Cr of 0.59). Liver Function Tests:  Recent Labs Lab 12/23/15 1218 12/23/15 1759  AST 42* 37  ALT 71* 60  ALKPHOS 111 96  BILITOT 0.7 0.7  PROT 7.2 6.1*  ALBUMIN 3.1* 2.6*   No results for input(s): LIPASE, AMYLASE in the last 168 hours. No results for input(s): AMMONIA in the last 168 hours. Coagulation Profile: No results for input(s): INR, PROTIME in the last 168 hours. Cardiac Enzymes: No results for input(s): CKTOTAL, CKMB, CKMBINDEX, TROPONINI in the last 168 hours. BNP (last 3 results) No results for input(s): PROBNP in the last 8760 hours. HbA1C: No results for input(s): HGBA1C in the last 72 hours. CBG: No results for input(s): GLUCAP in the last 168 hours. Lipid Profile: No results for input(s): CHOL, HDL, LDLCALC, TRIG, CHOLHDL, LDLDIRECT in the last 72 hours. Thyroid Function Tests: No results for input(s): TSH, T4TOTAL, FREET4, T3FREE, THYROIDAB in the last 72 hours. Anemia Panel: No results for input(s): VITAMINB12, FOLATE, FERRITIN, TIBC, IRON, RETICCTPCT in the last 72 hours. Urine analysis:    Component Value Date/Time   COLORURINE YELLOW 12/23/2015 1405   APPEARANCEUR TURBID* 12/23/2015 1405   LABSPEC 1.026 12/23/2015 1405   PHURINE 6.0 12/23/2015 1405   GLUCOSEU NEGATIVE 12/23/2015 1405   HGBUR SMALL* 12/23/2015 1405     BILIRUBINUR NEGATIVE 12/23/2015 1405   KETONESUR NEGATIVE 12/23/2015 1405   PROTEINUR 30* 12/23/2015 1405   UROBILINOGEN 0.2 07/23/2015 1036   NITRITE POSITIVE* 12/23/2015 1405   LEUKOCYTESUR LARGE* 12/23/2015 1405   Sepsis Labs: @LABRCNTIP (procalcitonin:4,lacticidven:4)  )No results found for this or any previous visit (from the past 240 hour(s)).       Radiology Studies: Dg Skull 1-3 Views  12/23/2015  CLINICAL DATA:  Headache.  Ventricular peritoneal shunt series. EXAM: SKULL - 1-3 VIEW COMPARISON:  Radiographs 07/07/2015.  CT 07/22/2015. FINDINGS: Right-sided ventriculoperitoneal shunt appears intact within the right neck as well as the intracranial portion. There is an old broken ventriculoperitoneal shunt on the right which is also unchanged. No acute findings are seen. IMPRESSION: The right-sided ventriculoperitoneal shunt appears intact within the head and neck. Old shunt tubing remains. Electronically Signed   By: Richardean Sale M.D.   On: 12/23/2015 14:09   Dg Chest 1 View  12/23/2015  CLINICAL DATA:  Headache.  Ventricular peritoneal shunt series. EXAM:  CHEST 1 VIEW COMPARISON:  12/23/2015 and 07/07/2015. FINDINGS: Right-sided shunt extending to the level of the SVC is intact and unchanged. There is old discontinuous shunt tubing over the right chest, terminating over the right upper quadrant of the abdomen. Chest wall deformity status post spinal fusion appears unchanged. The lungs are clear. IMPRESSION: The thoracic portions of the shunt are intact, terminating over the level of the SVC. Old shunt tubing remains. Electronically Signed   By: Richardean Sale M.D.   On: 12/23/2015 14:12   Dg Cervical Spine 1 View  12/23/2015  CLINICAL DATA:  Ventriculoperitoneal shunt. EXAM: CERVICAL SPINE 1 VIEW COMPARISON:  None. FINDINGS: There is evidence of an old broken ventriculoperitoneal shunt in the right cervical region. There also appears to be an intact ventriculoperitoneal shunt in  the right cervical region. IMPRESSION: See above. Electronically Signed   By: Marijo Conception, M.D.   On: 12/23/2015 14:03   Dg Abd 1 View  12/23/2015  CLINICAL DATA:  Headache.  Ventricular peritoneal shunt series. EXAM: ABDOMEN - 1 VIEW COMPARISON:  Radiographs 07/22/2015 and 11/06/2014. FINDINGS: Old shunt tubing overlies the right hemidiaphragm, unchanged from the previous study. No other shunt tubing identified within the abdomen. The visualized bowel gas pattern is normal. There are postsurgical changes status post thoracolumbar fusion. The left spinal rod is chronically fractured and appears unchanged. IMPRESSION: Stable radiographic appearance of the abdomen. There is no functional shunt tubing in the abdomen. Electronically Signed   By: Richardean Sale M.D.   On: 12/23/2015 14:07   Ct Head Wo Contrast  12/23/2015  CLINICAL DATA:  Headache.  Evaluate shunt EXAM: CT HEAD WITHOUT CONTRAST TECHNIQUE: Contiguous axial images were obtained from the base of the skull through the vertex without intravenous contrast. COMPARISON:  07/22/2015 FINDINGS: Right parietal shunt again noted in place, extending across the midline into the frontal horn of the left lateral ventricle. No hydrocephalus. No acute infarction or hemorrhage. Partially genesis of the corpus callosum noted. Chiari 1 malformation noted. Mild mucosal thickening in the paranasal sinuses. Mastoid air cells are clear. IMPRESSION: VP shunt remains in stable position.  No hydrocephalus. Electronically Signed   By: Rolm Baptise M.D.   On: 12/23/2015 14:47   Dg Chest Port 1 View  12/23/2015  CLINICAL DATA:  Fever.  History of spinal bifida with hydrocephalus. EXAM: PORTABLE CHEST 1 VIEW COMPARISON:  11/20/2015; 07/22/2015; 07/07/2015 FINDINGS: Grossly unchanged cardiac silhouette and mediastinal contours. Lung volumes remain reduced. No focal airspace opacities. No pleural effusion or pneumothorax. No evidence of edema. Ventriculoperitoneal catheter  tubing courses along the right midclavicular line. Post long segment thoracolumbar spine paraspinal fusion. The left-sided paraspinal rods again noted to be fractured at its mid aspect similar to the 07/07/2015 examination IMPRESSION: Persistent findings of hypoventilation without acute cardiopulmonary disease. Specifically, no evidence of pneumonia. Electronically Signed   By: Sandi Mariscal M.D.   On: 12/23/2015 13:01        Scheduled Meds: . ceFEPime (MAXIPIME) IV  2 g Intravenous 3 times per day  . verapamil  60 mg Oral QHS   Continuous Infusions: . sodium chloride 75 mL/hr at 12/23/15 1851        Time spent: [place], MD [facility] ([facility]   If 7PM-7AM, please contact night-coverage [email or web address] Password [facility] 12/24/2015, 8:42 AM

## 2015-12-25 DIAGNOSIS — L89303 Pressure ulcer of unspecified buttock, stage 3: Secondary | ICD-10-CM | POA: Diagnosis not present

## 2015-12-25 DIAGNOSIS — I10 Essential (primary) hypertension: Secondary | ICD-10-CM

## 2015-12-25 DIAGNOSIS — N390 Urinary tract infection, site not specified: Secondary | ICD-10-CM | POA: Diagnosis not present

## 2015-12-25 LAB — URINE CULTURE

## 2015-12-25 LAB — COMPREHENSIVE METABOLIC PANEL
ALBUMIN: 2.5 g/dL — AB (ref 3.5–5.0)
ALT: 37 U/L (ref 17–63)
ANION GAP: 11 (ref 5–15)
AST: 20 U/L (ref 15–41)
Alkaline Phosphatase: 73 U/L (ref 38–126)
BILIRUBIN TOTAL: 0.4 mg/dL (ref 0.3–1.2)
BUN: <5 mg/dL — ABNORMAL LOW (ref 6–20)
CALCIUM: 8.4 mg/dL — AB (ref 8.9–10.3)
CO2: 23 mmol/L (ref 22–32)
Chloride: 106 mmol/L (ref 101–111)
Creatinine, Ser: 0.46 mg/dL — ABNORMAL LOW (ref 0.61–1.24)
Glucose, Bld: 83 mg/dL (ref 65–99)
POTASSIUM: 2.9 mmol/L — AB (ref 3.5–5.1)
Sodium: 140 mmol/L (ref 135–145)
TOTAL PROTEIN: 6.2 g/dL — AB (ref 6.5–8.1)

## 2015-12-25 LAB — CBC
HEMATOCRIT: 29.7 % — AB (ref 39.0–52.0)
Hemoglobin: 9.2 g/dL — ABNORMAL LOW (ref 13.0–17.0)
MCH: 23.2 pg — AB (ref 26.0–34.0)
MCHC: 31 g/dL (ref 30.0–36.0)
MCV: 75 fL — AB (ref 78.0–100.0)
Platelets: 215 10*3/uL (ref 150–400)
RBC: 3.96 MIL/uL — ABNORMAL LOW (ref 4.22–5.81)
RDW: 15.8 % — AB (ref 11.5–15.5)
WBC: 8.2 10*3/uL (ref 4.0–10.5)

## 2015-12-25 LAB — PROTIME-INR
INR: 1.15 (ref 0.00–1.49)
PROTHROMBIN TIME: 14.8 s (ref 11.6–15.2)

## 2015-12-25 MED ORDER — ADULT MULTIVITAMIN W/MINERALS CH
1.0000 | ORAL_TABLET | Freq: Every day | ORAL | Status: DC
  Administered 2015-12-25 – 2015-12-26 (×2): 1 via ORAL
  Filled 2015-12-25 (×2): qty 1

## 2015-12-25 MED ORDER — POTASSIUM CHLORIDE CRYS ER 20 MEQ PO TBCR
40.0000 meq | EXTENDED_RELEASE_TABLET | Freq: Two times a day (BID) | ORAL | Status: DC
  Administered 2015-12-25 – 2015-12-26 (×3): 40 meq via ORAL
  Filled 2015-12-25 (×3): qty 2

## 2015-12-25 MED ORDER — NITROFURANTOIN MONOHYD MACRO 100 MG PO CAPS
100.0000 mg | ORAL_CAPSULE | Freq: Two times a day (BID) | ORAL | Status: DC
  Administered 2015-12-25 – 2015-12-26 (×3): 100 mg via ORAL
  Filled 2015-12-25 (×3): qty 1

## 2015-12-25 NOTE — Progress Notes (Signed)
PROGRESS NOTE    [name]  [id number] DOB: 1980/11/19 DOA: 12/23/2015 PCP: Elyn Peers, MD  Outpatient Specialists:  [facility]   Brief Narrative:  35 y/o ? history of spina bifida status post multipleShunts/reservoirs-managed [facility] Dr. Marygrace Drought group-- last office visit October 2016 where shunt was replaced Chr ileotomy since age16 Functional paraplegia and urinary incontinence followed by [facility] Baseline able to transfer and do some ADL and IADL activities but limited by lowe extremity plegia Prior sepsis 11/2015  ED temp 102.4, HR 108, BP 94/61.  Sats upper 90s on r/a.   Lactic acid 2.07, WBC 11.1. Hgb 9.0, down from 11.5 Mid March.    Hypotension--held verapamil/toprol on admit    Assessment & Plan:   Active Problems:   Neurogenic bladder-Etiology likely secondary to functional paraplegia. -Patient's mother is willing to learn again how to use in and out Foley catheters to prevent urinary leakage and retention and I have asked nursing to teach the patient's mother at the same   Sepsis secondary to enterococcal pyelonephritis- transitioned from empiric cefepime to ceftriaxone-->Macrobid #12/25/2015. Marland Kitchen  Patient had an elevated lactic acid which is now trending down-patient was NSL 12/25/2015   Decubitus ulcer of buttock, stage 3 (HCC)--Patient has a clean circumscribed right gluteal wound that is not losing. This is not the source of sepsis. Appreciate wound care input-Aquacel to dressing    Chiari malformation status post multiple interventions including shunt placement-this is managed by [facility]. Patient was seen in October 2016 and had the shunt replaced. There is a remnant of the old shunt left in situ. This should be followed up as an outpatient by [facility]. There is no contributor of these issues to his current illness.   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 Blood pressures are too low to support this at present time.   Hypokalemia-K = 2.9 replace orally K Dur 40 by mouth mEq/14/17    Transaminitis on admission 12/23/2015-    Chronic migraine-patient is on verapamil for suppressive therapy of migraines. His blood pressure will not support the dose of one 20 mg CR therefore we will cut this in half to 60 mg CR and give today.     DVT prophylaxis: Lovenox Code Status: Full Family Communication: d/w mother Disposition Plan: To be discussed-likely discharge back to home in 2-3 days   Procedures:     Antimicrobials:   Cefepime-->ceftriaxone 12/24/15    Subjective:  Looks better, feels better     Objective: Filed Vitals:   12/24/15 1510 12/24/15 2235 12/25/15 0522 12/25/15 1446  BP: 98/58 106/51 100/55 108/65  Pulse: 90 97 80 96  Temp:  98.1 F (36.7 C) 97.9 F (36.6 C) 98.3 F (36.8 C)  TempSrc:      Resp: 19 18 18 18   Weight:      SpO2: 100% 99% 100% 100%    Intake/Output Summary (Last 24 hours) at 12/25/15 1510 Last data filed at 12/25/15 1439  Gross per 24 hour  Intake    780 ml  Output   3600 ml  Net  -2820 ml   Filed Weights   12/23/15 1359  Weight: 65.772 kg (145 lb)    Examination:  General exam: Appears calm and comfortable  Respiratory system: Clear to auscultation. Respiratory effort normal. Cardiovascular system: S1 & S2 heard, RRR. No JVD, murmurs, rubs, gallops or clicks. No pedal edema. Gastrointestinal system: Abdomen Has a ileostomy in  the right middle quadrant Central nervous system: Alert and oriented. Sensory as well as motor exam is limited in the lower extremity with his functional paraplegia. He is able to track vision with his eyes without any deficit and has good strength in his biceps triceps and forearms as well as shoulders Extremities: Symmetric 5 x 5 power. Skin: 3 x 3 cm clean-based ulcer in the right buttock Psychiatry: Judgement and  insight appear normal. Mood & affect appropriate.     Data Reviewed: I have personally reviewed following labs and imaging studies  CBC:  Recent Labs Lab 12/23/15 1218 12/23/15 1310 12/25/15 0453  WBC 11.1* 10.0 8.2  NEUTROABS 9.9* 8.6*  --   HGB 11.1* 9.9* 9.2*  HCT 35.2* 31.8* 29.7*  MCV 74.9* 75.4* 75.0*  PLT 184 235 [id number]   Basic Metabolic Panel:  Recent Labs Lab 12/23/15 1218 12/23/15 1759 12/25/15 0453  NA 135 139 140  K 3.5 3.0* 2.9*  CL 96* 106 106  CO2 26 22 23   GLUCOSE 99 138* 83  BUN 17 11 <5*  CREATININE 0.73 0.59* 0.46*  CALCIUM 8.8* 7.9* 8.4*   GFR: Estimated Creatinine Clearance: 105.6 mL/min (by C-G formula based on Cr of 0.46). Liver Function Tests:  Recent Labs Lab 12/23/15 1218 12/23/15 1759 12/25/15 0453  AST 42* 37 20  ALT 71* 60 37  ALKPHOS 111 96 73  BILITOT 0.7 0.7 0.4  PROT 7.2 6.1* 6.2*  ALBUMIN 3.1* 2.6* 2.5*   No results for input(s): LIPASE, AMYLASE in the last 168 hours. No results for input(s): AMMONIA in the last 168 hours. Coagulation Profile:  Recent Labs Lab 12/25/15 0453  INR 1.15   Cardiac Enzymes: No results for input(s): CKTOTAL, CKMB, CKMBINDEX, TROPONINI in the last 168 hours. BNP (last 3 results) No results for input(s): PROBNP in the last 8760 hours. HbA1C: No results for input(s): HGBA1C in the last 72 hours. CBG: No results for input(s): GLUCAP in the last 168 hours. Lipid Profile: No results for input(s): CHOL, HDL, LDLCALC, TRIG, CHOLHDL, LDLDIRECT in the last 72 hours. Thyroid Function Tests: No results for input(s): TSH, T4TOTAL, FREET4, T3FREE, THYROIDAB in the last 72 hours. Anemia Panel: No results for input(s): VITAMINB12, FOLATE, FERRITIN, TIBC, IRON, RETICCTPCT in the last 72 hours. Urine analysis:    Component Value Date/Time   COLORURINE YELLOW 12/23/2015 1405   APPEARANCEUR TURBID* 12/23/2015 1405   LABSPEC 1.026 12/23/2015 1405   PHURINE 6.0 12/23/2015 1405   GLUCOSEU NEGATIVE  12/23/2015 1405   HGBUR SMALL* 12/23/2015 1405   BILIRUBINUR NEGATIVE 12/23/2015 1405   KETONESUR NEGATIVE 12/23/2015 1405   PROTEINUR 30* 12/23/2015 1405   UROBILINOGEN 0.2 07/23/2015 1036   NITRITE POSITIVE* 12/23/2015 1405   LEUKOCYTESUR LARGE* 12/23/2015 1405   Sepsis Labs: @LABRCNTIP (procalcitonin:4,lacticidven:4)  ) Recent Results (from the past 240 hour(s))  Blood Culture (routine x 2)     Status: None (Preliminary result)   Collection Time: 12/23/15 12:15 PM  Result Value Ref Range Status   Specimen Description BLOOD RIGHT FOREARM  Final   Special Requests BOTTLES DRAWN AEROBIC AND ANAEROBIC 5CC  Final   Culture NO GROWTH 1 DAY  Final   Report Status PENDING  Incomplete  Urine culture     Status: Abnormal   Collection Time: 12/23/15  2:05 PM  Result Value Ref Range Status   Specimen Description URINE, CATHETERIZED  Final   Special Requests NONE  Final   Culture >=100,000 COLONIES/mL ENTEROCOCCUS SPECIES (A)  Final  Report Status 12/25/2015 FINAL  Final   Organism ID, Bacteria ENTEROCOCCUS SPECIES (A)  Final      Susceptibility   Enterococcus species - MIC*    AMPICILLIN <=2 SENSITIVE Sensitive     LEVOFLOXACIN 0.5 SENSITIVE Sensitive     NITROFURANTOIN <=16 SENSITIVE Sensitive     VANCOMYCIN 1 SENSITIVE Sensitive     * >=100,000 COLONIES/mL ENTEROCOCCUS SPECIES         Radiology Studies: No results found.      Scheduled Meds: . enoxaparin (LOVENOX) injection  40 mg Subcutaneous Q24H  . multivitamin with minerals  1 tablet Oral Daily  . nitrofurantoin (macrocrystal-monohydrate)  100 mg Oral Q12H  . potassium chloride  40 mEq Oral BID  . verapamil  60 mg Oral QHS   Continuous Infusions: . sodium chloride 1,000 mL (12/25/15 1056)        Time spent: 30    Verneita Griffes, MD [facility] (P) [phone]   If 7PM-7AM, please contact night-coverage [email or web address] Password [email or web address] 12/25/2015, 3:10 PM

## 2015-12-25 NOTE — Progress Notes (Signed)
Patient's mother instructed by staff to do In and Out cath at home when patient will be discharged. Catheter Fr 16. Was used.  Returned 583ml urine. Will continue to monitor.

## 2015-12-25 NOTE — Progress Notes (Signed)
Initial Nutrition Assessment  DOCUMENTATION CODES:   Not applicable  INTERVENTION:  -Encouraged protein intake at all meals and snacks -Identified protein rich foods to add at meals and snacks -MVI with minerals daily.  -Continue to monitor nutritional needs  NUTRITION DIAGNOSIS:   Increased nutrient needs related to wound healing as evidenced by estimated needs.  GOAL:   Patient will meet greater than or equal to 90% of their needs  MONITOR:   PO intake, Labs, Weight trends, Skin, I & O's  REASON FOR ASSESSMENT:   Low Braden    ASSESSMENT:   35 y.o. male with a Past Medical History of HTN, spina bifida, arnold chiari malformation, congenital paraplegia, permenant ileostomy, borderline DM, who presents with sepsis, likely secondary to UTI.  Pt seen for low braden. Pt weight stable per chart and pt report. Pt reports eating TID. Pt denies poor appetite, N/V, and abdominal pain associated with eating. Pt does not take nutritional supplements at home and refuses them here. Pt has difficulty eating fruits and vegetables d/t ileostomy but pt takes a MVI at home. Per chart, pt has a stage 3 pressure ulcer on ischial tuberosity. D/t wounds, pt has increased protein needs. Pt does amenable to prostat but willing to order protein with all meals and snacks. Intern discussed protein rich foods that could be incorporated into meals and snacks. Pt and pt's mother seemed amenable to making these changes. Will order MVI for pt.   NFPE: No fat depletion, no muscle depletion, unable to assess edema.    Labs reviewed; K 2.9 mmol/L, BUN <5 mg/dl, Ca 8.4 mg/dl. Meds reviewed; KCl 40 mEq  Diet Order:  Diet regular Room service appropriate?: Yes; Fluid consistency:: Thin  Skin:  Wound (see comment) (Pressure ulcer on Ischial Tuberosity stage 3 )  Last BM:  4/13  Height:   Ht Readings from Last 1 Encounters:  11/20/15 5' (1.524 m)    Weight:   Wt Readings from Last 1 Encounters:   12/23/15 145 lb (65.772 kg)    Ideal Body Weight:  45.7 kg  BMI:  Body mass index is 28.32 kg/(m^2).  Estimated Nutritional Needs:   Kcal:  1800-2000 (30 kcals/kg)  Protein:  90-100 g (1.5 g/kg)  Fluid:  2 L   EDUCATION NEEDS:   No education needs identified at this time  [name], [place] Dietetic Intern Pager [phone]

## 2015-12-26 DIAGNOSIS — I10 Essential (primary) hypertension: Secondary | ICD-10-CM | POA: Diagnosis not present

## 2015-12-26 DIAGNOSIS — K420 Umbilical hernia with obstruction, without gangrene: Secondary | ICD-10-CM | POA: Diagnosis not present

## 2015-12-26 DIAGNOSIS — L89309 Pressure ulcer of unspecified buttock, unspecified stage: Secondary | ICD-10-CM | POA: Diagnosis not present

## 2015-12-26 DIAGNOSIS — N390 Urinary tract infection, site not specified: Secondary | ICD-10-CM | POA: Diagnosis not present

## 2015-12-26 DIAGNOSIS — I952 Hypotension due to drugs: Secondary | ICD-10-CM

## 2015-12-26 DIAGNOSIS — L89303 Pressure ulcer of unspecified buttock, stage 3: Secondary | ICD-10-CM | POA: Diagnosis not present

## 2015-12-26 LAB — BASIC METABOLIC PANEL
ANION GAP: 11 (ref 5–15)
BUN: 5 mg/dL — ABNORMAL LOW (ref 6–20)
CALCIUM: 8.7 mg/dL — AB (ref 8.9–10.3)
CO2: 25 mmol/L (ref 22–32)
CREATININE: 0.49 mg/dL — AB (ref 0.61–1.24)
Chloride: 104 mmol/L (ref 101–111)
Glucose, Bld: 82 mg/dL (ref 65–99)
Potassium: 3.5 mmol/L (ref 3.5–5.1)
SODIUM: 140 mmol/L (ref 135–145)

## 2015-12-26 LAB — CBC
HCT: 31.9 % — ABNORMAL LOW (ref 39.0–52.0)
Hemoglobin: 9.8 g/dL — ABNORMAL LOW (ref 13.0–17.0)
MCH: 22.8 pg — AB (ref 26.0–34.0)
MCHC: 30.7 g/dL (ref 30.0–36.0)
MCV: 74.4 fL — ABNORMAL LOW (ref 78.0–100.0)
PLATELETS: 243 10*3/uL (ref 150–400)
RBC: 4.29 MIL/uL (ref 4.22–5.81)
RDW: 15.8 % — ABNORMAL HIGH (ref 11.5–15.5)
WBC: 10.9 10*3/uL — AB (ref 4.0–10.5)

## 2015-12-26 MED ORDER — NITROFURANTOIN MONOHYD MACRO 100 MG PO CAPS: 100.0000 mg | ORAL_CAPSULE | Freq: Two times a day (BID) | ORAL | Status: DC

## 2015-12-26 MED ORDER — VERAPAMIL HCL ER 120 MG PO TBCR: 60.0000 mg | EXTENDED_RELEASE_TABLET | Freq: Every day | ORAL | Status: DC

## 2015-12-26 NOTE — Progress Notes (Signed)
NURSING PROGRESS NOTE  [name] [id number] Discharge Data: 12/26/2015 12:38 PM Attending Provider: Nita Sells, MD [id number] J, MD   Leodis Sias to be D/C'd Home per MD order.    All IV's will be discontinued and monitored for bleeding.  All belongings will be returned to patient for patient to take home.  Last Documented Vital Signs:  Blood pressure 113/61, pulse 91, temperature 98.2 F (36.8 C), temperature source Oral, resp. rate 18, weight 65.772 kg (145 lb), SpO2 99 %.  Joslyn Hy, MSN, RN, [employer]

## 2015-12-26 NOTE — Discharge Summary (Signed)
Physician Discharge Summary  [name] [id number] DOB: 1981-05-06 DOA: 12/23/2015  PCP: Elyn Peers, MD  Admit date: 12/23/2015 Discharge date: 12/26/2015  Time spent: 35 minutes  Recommendations for Outpatient Follow-up:  1. Outpatient neurosurgery follow-up [facility] 2. Outpatient ophthalmology follow-up scheduled with ophthalmology by mom 3. Please follow-up with PCP 4. Recommend stop date Macrobid 12/30/2015  Discharge Diagnoses:  Active Problems:   Neurogenic bladder   UTI (lower urinary tract infection)   Sepsis secondary to UTI (HCC)   Decubitus ulcer of buttock, stage 3 (HCC)   Essential hypertension   Hypotension   Discharge Condition:  Improved improved  Diet recommendation:  Regular  Filed Weights   12/23/15 1359  Weight: 65.772 kg (145 lb)    History of present illness: 35 y/o ? history of spina bifida status post multipleShunts/reservoirs-managed [facility] Dr. Marygrace Drought group-- last office visit October 2016 where shunt was replaced Chr ileotomy since age16 Functional paraplegia and urinary incontinence followed by [facility] Baseline able to transfer and do some ADL and IADL activities but limited by lowe extremity plegia Prior sepsis 11/2015  ED temp 102.4, HR 108, BP 94/61.  Sats upper 90s on r/a.   Lactic acid 2.07, WBC 11.1. Hgb 9.0, down from 11.5 Mid March.    Hypotension--held verapamil/toprol on admit   Hospital Course:     Neurogenic bladder-Etiology likely secondary to functional paraplegia. -Patient's mother is willing to learn again how to use in and out Foley catheters to prevent urinary leakage and retention  -I did advise the patient's mother to obtain 34 French catheters for intermittent catheterization if needed she is very conversant and is able to manage his ostomy as well as his overall care and has done so for a long time .   Sepsis secondary to enterococcal pyelonephritis- transitioned from empiric cefepime to  ceftriaxone-->Macrobid #12/25/2015. Marland Kitchen  Patient had an elevated lactic acid which is now trending down-patient was NSL 12/25/2015 Complete course of antibiotics 12/30/15    Decubitus ulcer of buttock, stage 3 (HCC)--Patient has a clean circumscribed right gluteal wound that is not losing. This is not the source of sepsis. Appreciate wound care input-Aquacel to dressing     Chiari malformation status post multiple interventions including shunt placement-this is managed by [facility]. Patient was seen in October 2016 and had the shunt replaced. There is a remnant of the old shunt left in situ. This should be followed up as an outpatient by [facility]. There is no contributor of these issues to his current illness.    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 Blood pressures are too low to support this at present time.    Hypokalemia-K = 2.9 replace orally K Dur 40 by mouth -on discharge resumed home dosing of this medication      Transaminitis on admission 12/23/2015- resolved      Chronic migraine-patient is on verapamil for suppressive therapy of migraines. His blood pressure will not support the dose of one 20 mg CR therefore we will cut this in half to 60 mg CR and give today.     Discharge Exam: Filed Vitals:   12/25/15 2054 12/26/15 0659  BP: 115/69 113/61  Pulse: 108 91  Temp: 98.8 F (37.1 C) 98.2 F (36.8 C)  Resp: 20 18      GenAlert pleasant oriented no apparent distress tolerating diet EOMI NCAT moving all 4 limbs equally no pallor no icterus S1-S2 no  murmur rub or gallop Cannot move lower extremities but sensory and power intact. Discharge Instructions   Discharge Instructions    Diet - low sodium heart healthy    Complete by:  As directed      Discharge instructions    Complete by:  As directed   Subjective please follow-up with multiple specialists including  [facility] as well as your neurosurgeon at [facility] for further management of your shunt I would recommend that you obtain on the Internet fully catheters 16 [place] for intermittent catheterization-I felt that this admission was probably because of your urinary incontinence/dribbling because of your paraplegia-please ensure that you use sterile technique so we do not cause any other issues I would recommend that he cut back your dose of verapamil from 120-60 mg for migraine prophylaxis-for now I would not use her metoprolol for blood pressure control as her blood pressure is well controlled in the hospital Please follow-up with your primary care physician for labs in about a week or so-please also follow up with your other specialists such as her ophthalmologist etc.     Increase activity slowly    Complete by:  As directed           Current Discharge Medication List    START taking these medications   Details  nitrofurantoin, macrocrystal-monohydrate, (MACROBID) 100 MG capsule Take 1 capsule (100 mg total) by mouth every 12 (twelve) hours. Qty: 8 capsule, Refills: 0      CONTINUE these medications which have CHANGED   Details  verapamil (CALAN-SR) 120 MG CR tablet Take 0.5 tablets (60 mg total) by mouth at bedtime. Qty: 30 tablet      CONTINUE these medications which have NOT CHANGED   Details  acetaminophen (TYLENOL) 650 MG CR tablet Take 1,300 mg by mouth every 8 (eight) hours as needed for pain.    Cholecalciferol (VITAMIN D3) 2000 units capsule Take 2,000 Units by mouth daily.    Cranberry 400 MG CAPS Take 4,200 mg by mouth every morning.     Multiple Vitamin (MULTIVITAMIN) tablet Take 1 tablet by mouth daily.    ondansetron (ZOFRAN) 4 MG tablet Take 4 mg by mouth every 8 (eight) hours as needed for nausea or vomiting.    oxyCODONE (OXY IR/ROXICODONE) 5 MG immediate release tablet Take 1 tablet (5 mg total) by mouth every 6 (six) hours as needed. 1 by mouth every 3  hours as needed for MILD pain, take 2 by mouth every 3 hours as needed for MODERATE pain, 3 by mouth every 3 hours as needed for SEVERE pain Qty: 30 tablet, Refills: 0    potassium chloride 20 MEQ TBCR Take 20 mEq by mouth daily. Qty: 30 tablet, Refills: 0      STOP taking these medications     metoprolol (LOPRESSOR) 50 MG tablet        Allergies  Allergen Reactions  . Latex Hives  . Succinylcholine Rash    Other reaction(s): Other (See Comments) Pt is half [place] - prolonged paralysis due to compromised metabolism. Succinylcholine given on 07/07/15 at outside hospital and pt experienced paralysis > 3 hours.  Due to being native [place], his mother states that he takes longer to "come out of it"  . Ciprofloxacin Other (See Comments)    IV only-- caused burning in arm   . Vancomycin Itching and Swelling  . Adhesive [Tape] Hives and Rash      The results of significant diagnostics from this hospitalization (  including imaging, microbiology, ancillary and laboratory) are listed below for reference.    Significant Diagnostic Studies: Dg Skull 1-3 Views  12/23/2015  CLINICAL DATA:  Headache.  Ventricular peritoneal shunt series. EXAM: SKULL - 1-3 VIEW COMPARISON:  Radiographs 07/07/2015.  CT 07/22/2015. FINDINGS: Right-sided ventriculoperitoneal shunt appears intact within the right neck as well as the intracranial portion. There is an old broken ventriculoperitoneal shunt on the right which is also unchanged. No acute findings are seen. IMPRESSION: The right-sided ventriculoperitoneal shunt appears intact within the head and neck. Old shunt tubing remains. Electronically Signed   By: Richardean Sale M.D.   On: 12/23/2015 14:09   Dg Chest 1 View  12/23/2015  CLINICAL DATA:  Headache.  Ventricular peritoneal shunt series. EXAM: CHEST 1 VIEW COMPARISON:  12/23/2015 and 07/07/2015. FINDINGS: Right-sided shunt extending to the level of the SVC is intact and unchanged. There is old  discontinuous shunt tubing over the right chest, terminating over the right upper quadrant of the abdomen. Chest wall deformity status post spinal fusion appears unchanged. The lungs are clear. IMPRESSION: The thoracic portions of the shunt are intact, terminating over the level of the SVC. Old shunt tubing remains. Electronically Signed   By: Richardean Sale M.D.   On: 12/23/2015 14:12   Dg Cervical Spine 1 View  12/23/2015  CLINICAL DATA:  Ventriculoperitoneal shunt. EXAM: CERVICAL SPINE 1 VIEW COMPARISON:  None. FINDINGS: There is evidence of an old broken ventriculoperitoneal shunt in the right cervical region. There also appears to be an intact ventriculoperitoneal shunt in the right cervical region. IMPRESSION: See above. Electronically Signed   By: Marijo Conception, M.D.   On: 12/23/2015 14:03   Dg Abd 1 View  12/23/2015  CLINICAL DATA:  Headache.  Ventricular peritoneal shunt series. EXAM: ABDOMEN - 1 VIEW COMPARISON:  Radiographs 07/22/2015 and 11/06/2014. FINDINGS: Old shunt tubing overlies the right hemidiaphragm, unchanged from the previous study. No other shunt tubing identified within the abdomen. The visualized bowel gas pattern is normal. There are postsurgical changes status post thoracolumbar fusion. The left spinal rod is chronically fractured and appears unchanged. IMPRESSION: Stable radiographic appearance of the abdomen. There is no functional shunt tubing in the abdomen. Electronically Signed   By: Richardean Sale M.D.   On: 12/23/2015 14:07   Ct Head Wo Contrast  12/23/2015  CLINICAL DATA:  Headache.  Evaluate shunt EXAM: CT HEAD WITHOUT CONTRAST TECHNIQUE: Contiguous axial images were obtained from the base of the skull through the vertex without intravenous contrast. COMPARISON:  07/22/2015 FINDINGS: Right parietal shunt again noted in place, extending across the midline into the frontal horn of the left lateral ventricle. No hydrocephalus. No acute infarction or hemorrhage.  Partially genesis of the corpus callosum noted. Chiari 1 malformation noted. Mild mucosal thickening in the paranasal sinuses. Mastoid air cells are clear. IMPRESSION: VP shunt remains in stable position.  No hydrocephalus. Electronically Signed   By: Rolm Baptise M.D.   On: 12/23/2015 14:47   Dg Chest Port 1 View  12/23/2015  CLINICAL DATA:  Fever.  History of spinal bifida with hydrocephalus. EXAM: PORTABLE CHEST 1 VIEW COMPARISON:  11/20/2015; 07/22/2015; 07/07/2015 FINDINGS: Grossly unchanged cardiac silhouette and mediastinal contours. Lung volumes remain reduced. No focal airspace opacities. No pleural effusion or pneumothorax. No evidence of edema. Ventriculoperitoneal catheter tubing courses along the right midclavicular line. Post long segment thoracolumbar spine paraspinal fusion. The left-sided paraspinal rods again noted to be fractured at its mid aspect similar to the 07/07/2015  examination IMPRESSION: Persistent findings of hypoventilation without acute cardiopulmonary disease. Specifically, no evidence of pneumonia. Electronically Signed   By: Sandi Mariscal M.D.   On: 12/23/2015 13:01    Microbiology: Recent Results (from the past 240 hour(s))  Blood Culture (routine x 2)     Status: None (Preliminary result)   Collection Time: 12/23/15 12:15 PM  Result Value Ref Range Status   Specimen Description BLOOD RIGHT FOREARM  Final   Special Requests BOTTLES DRAWN AEROBIC AND ANAEROBIC 5CC  Final   Culture NO GROWTH 2 DAYS  Final   Report Status PENDING  Incomplete  Blood Culture (routine x 2)     Status: None (Preliminary result)   Collection Time: 12/23/15 12:18 PM  Result Value Ref Range Status   Specimen Description BLOOD RIGHT ANTECUBITAL  Final   Special Requests BOTTLES DRAWN AEROBIC ONLY 5CC  Final   Culture NO GROWTH 1 DAY  Final   Report Status PENDING  Incomplete  Urine culture     Status: Abnormal   Collection Time: 12/23/15  2:05 PM  Result Value Ref Range Status    Specimen Description URINE, CATHETERIZED  Final   Special Requests NONE  Final   Culture >=100,000 COLONIES/mL ENTEROCOCCUS SPECIES (A)  Final   Report Status 12/25/2015 FINAL  Final   Organism ID, Bacteria ENTEROCOCCUS SPECIES (A)  Final      Susceptibility   Enterococcus species - MIC*    AMPICILLIN <=2 SENSITIVE Sensitive     LEVOFLOXACIN 0.5 SENSITIVE Sensitive     NITROFURANTOIN <=16 SENSITIVE Sensitive     VANCOMYCIN 1 SENSITIVE Sensitive     * >=100,000 COLONIES/mL ENTEROCOCCUS SPECIES     Labs: Basic Metabolic Panel:  Recent Labs Lab 12/23/15 1218 12/23/15 1759 12/25/15 0453 12/26/15 0529  NA 135 139 140 140  K 3.5 3.0* 2.9* 3.5  CL 96* 106 106 104  CO2 26 22 23 25   GLUCOSE 99 138* 83 82  BUN 17 11 <5* 5*  CREATININE 0.73 0.59* 0.46* 0.49*  CALCIUM 8.8* 7.9* 8.4* 8.7*   Liver Function Tests:  Recent Labs Lab 12/23/15 1218 12/23/15 1759 12/25/15 0453  AST 42* 37 20  ALT 71* 60 37  ALKPHOS 111 96 73  BILITOT 0.7 0.7 0.4  PROT 7.2 6.1* 6.2*  ALBUMIN 3.1* 2.6* 2.5*   No results for input(s): LIPASE, AMYLASE in the last 168 hours. No results for input(s): AMMONIA in the last 168 hours. CBC:  Recent Labs Lab 12/23/15 1218 12/23/15 1310 12/25/15 0453 12/26/15 0529  WBC 11.1* 10.0 8.2 10.9*  NEUTROABS 9.9* 8.6*  --   --   HGB 11.1* 9.9* 9.2* 9.8*  HCT 35.2* 31.8* 29.7* 31.9*  MCV 74.9* 75.4* 75.0* 74.4*  PLT 184 235 215 243   Cardiac Enzymes: No results for input(s): CKTOTAL, CKMB, CKMBINDEX, TROPONINI in the last 168 hours. BNP: BNP (last 3 results) No results for input(s): BNP in the last 8760 hours.  ProBNP (last 3 results) No results for input(s): PROBNP in the last 8760 hours.  CBG: No results for input(s): GLUCAP in the last 168 hours.     SignedNita Sells MD   [facility] 12/26/2015, 9:35 AM

## 2015-12-26 NOTE — Progress Notes (Signed)
CM received call from RN pt is discharging.  Pt is OBS and does NOT require a face to face; is active with [facility] for HHRN/AIDE.  Orders placed and [facility] notified for resumption of care.  No other CM needs were communicated.

## 2015-12-28 DIAGNOSIS — J9801 Acute bronchospasm: Secondary | ICD-10-CM | POA: Diagnosis not present

## 2015-12-28 LAB — CULTURE, BLOOD (ROUTINE X 2): Culture: NO GROWTH

## 2015-12-29 LAB — CULTURE, BLOOD (ROUTINE X 2): Culture: NO GROWTH

## 2015-12-31 DIAGNOSIS — Q0703 Arnold-Chiari syndrome with spina bifida and hydrocephalus: Secondary | ICD-10-CM | POA: Diagnosis not present

## 2015-12-31 DIAGNOSIS — G911 Obstructive hydrocephalus: Secondary | ICD-10-CM | POA: Diagnosis not present

## 2015-12-31 DIAGNOSIS — L89313 Pressure ulcer of right buttock, stage 3: Secondary | ICD-10-CM | POA: Diagnosis not present

## 2015-12-31 DIAGNOSIS — G8220 Paraplegia, unspecified: Secondary | ICD-10-CM | POA: Diagnosis not present

## 2015-12-31 DIAGNOSIS — Q0701 Arnold-Chiari syndrome with spina bifida: Secondary | ICD-10-CM | POA: Diagnosis not present

## 2015-12-31 DIAGNOSIS — I10 Essential (primary) hypertension: Secondary | ICD-10-CM | POA: Diagnosis not present

## 2015-12-31 DIAGNOSIS — Z982 Presence of cerebrospinal fluid drainage device: Secondary | ICD-10-CM | POA: Diagnosis not present

## 2015-12-31 DIAGNOSIS — L89892 Pressure ulcer of other site, stage 2: Secondary | ICD-10-CM | POA: Diagnosis not present

## 2016-01-01 DIAGNOSIS — Z4800 Encounter for change or removal of nonsurgical wound dressing: Secondary | ICD-10-CM | POA: Diagnosis not present

## 2016-01-01 DIAGNOSIS — Z932 Ileostomy status: Secondary | ICD-10-CM | POA: Diagnosis not present

## 2016-01-01 DIAGNOSIS — K592 Neurogenic bowel, not elsewhere classified: Secondary | ICD-10-CM | POA: Diagnosis not present

## 2016-01-01 DIAGNOSIS — I10 Essential (primary) hypertension: Secondary | ICD-10-CM | POA: Diagnosis not present

## 2016-01-01 DIAGNOSIS — Z87440 Personal history of urinary (tract) infections: Secondary | ICD-10-CM | POA: Diagnosis not present

## 2016-01-01 DIAGNOSIS — N319 Neuromuscular dysfunction of bladder, unspecified: Secondary | ICD-10-CM | POA: Diagnosis not present

## 2016-01-01 DIAGNOSIS — R51 Headache: Secondary | ICD-10-CM | POA: Diagnosis not present

## 2016-01-01 DIAGNOSIS — L89313 Pressure ulcer of right buttock, stage 3: Secondary | ICD-10-CM | POA: Diagnosis not present

## 2016-01-01 DIAGNOSIS — H01029 Squamous blepharitis unspecified eye, unspecified eyelid: Secondary | ICD-10-CM | POA: Diagnosis not present

## 2016-01-01 DIAGNOSIS — G808 Other cerebral palsy: Secondary | ICD-10-CM | POA: Diagnosis not present

## 2016-01-01 DIAGNOSIS — R7303 Prediabetes: Secondary | ICD-10-CM | POA: Diagnosis not present

## 2016-01-01 DIAGNOSIS — Q052 Lumbar spina bifida with hydrocephalus: Secondary | ICD-10-CM | POA: Diagnosis not present

## 2016-01-01 DIAGNOSIS — K5732 Diverticulitis of large intestine without perforation or abscess without bleeding: Secondary | ICD-10-CM | POA: Diagnosis not present

## 2016-01-01 DIAGNOSIS — H53002 Unspecified amblyopia, left eye: Secondary | ICD-10-CM | POA: Diagnosis not present

## 2016-01-01 DIAGNOSIS — Z981 Arthrodesis status: Secondary | ICD-10-CM | POA: Diagnosis not present

## 2016-01-01 DIAGNOSIS — H5022 Vertical strabismus, left eye: Secondary | ICD-10-CM | POA: Diagnosis not present

## 2016-01-01 DIAGNOSIS — Z982 Presence of cerebrospinal fluid drainage device: Secondary | ICD-10-CM | POA: Diagnosis not present

## 2016-01-05 DIAGNOSIS — K592 Neurogenic bowel, not elsewhere classified: Secondary | ICD-10-CM | POA: Diagnosis not present

## 2016-01-05 DIAGNOSIS — G808 Other cerebral palsy: Secondary | ICD-10-CM | POA: Diagnosis not present

## 2016-01-05 DIAGNOSIS — R51 Headache: Secondary | ICD-10-CM | POA: Diagnosis not present

## 2016-01-05 DIAGNOSIS — L89313 Pressure ulcer of right buttock, stage 3: Secondary | ICD-10-CM | POA: Diagnosis not present

## 2016-01-05 DIAGNOSIS — Q052 Lumbar spina bifida with hydrocephalus: Secondary | ICD-10-CM | POA: Diagnosis not present

## 2016-01-05 DIAGNOSIS — Z4800 Encounter for change or removal of nonsurgical wound dressing: Secondary | ICD-10-CM | POA: Diagnosis not present

## 2016-01-05 DIAGNOSIS — R7303 Prediabetes: Secondary | ICD-10-CM | POA: Diagnosis not present

## 2016-01-05 DIAGNOSIS — Z981 Arthrodesis status: Secondary | ICD-10-CM | POA: Diagnosis not present

## 2016-01-05 DIAGNOSIS — Z982 Presence of cerebrospinal fluid drainage device: Secondary | ICD-10-CM | POA: Diagnosis not present

## 2016-01-05 DIAGNOSIS — N319 Neuromuscular dysfunction of bladder, unspecified: Secondary | ICD-10-CM | POA: Diagnosis not present

## 2016-01-05 DIAGNOSIS — Z87440 Personal history of urinary (tract) infections: Secondary | ICD-10-CM | POA: Diagnosis not present

## 2016-01-05 DIAGNOSIS — I10 Essential (primary) hypertension: Secondary | ICD-10-CM | POA: Diagnosis not present

## 2016-01-07 DIAGNOSIS — L89313 Pressure ulcer of right buttock, stage 3: Secondary | ICD-10-CM | POA: Diagnosis not present

## 2016-01-07 DIAGNOSIS — L89892 Pressure ulcer of other site, stage 2: Secondary | ICD-10-CM | POA: Diagnosis not present

## 2016-01-07 DIAGNOSIS — Z982 Presence of cerebrospinal fluid drainage device: Secondary | ICD-10-CM | POA: Diagnosis not present

## 2016-01-07 DIAGNOSIS — G8220 Paraplegia, unspecified: Secondary | ICD-10-CM | POA: Diagnosis not present

## 2016-01-07 DIAGNOSIS — Q0703 Arnold-Chiari syndrome with spina bifida and hydrocephalus: Secondary | ICD-10-CM | POA: Diagnosis not present

## 2016-01-07 DIAGNOSIS — I10 Essential (primary) hypertension: Secondary | ICD-10-CM | POA: Diagnosis not present

## 2016-01-11 DIAGNOSIS — L89309 Pressure ulcer of unspecified buttock, unspecified stage: Secondary | ICD-10-CM | POA: Diagnosis not present

## 2016-01-11 DIAGNOSIS — I10 Essential (primary) hypertension: Secondary | ICD-10-CM | POA: Diagnosis not present

## 2016-01-11 DIAGNOSIS — J9801 Acute bronchospasm: Secondary | ICD-10-CM | POA: Diagnosis not present

## 2016-01-11 DIAGNOSIS — K420 Umbilical hernia with obstruction, without gangrene: Secondary | ICD-10-CM | POA: Diagnosis not present

## 2016-01-11 DIAGNOSIS — E782 Mixed hyperlipidemia: Secondary | ICD-10-CM | POA: Diagnosis not present

## 2016-01-11 DIAGNOSIS — E874 Mixed disorder of acid-base balance: Secondary | ICD-10-CM | POA: Diagnosis not present

## 2016-01-12 DIAGNOSIS — Z87440 Personal history of urinary (tract) infections: Secondary | ICD-10-CM | POA: Diagnosis not present

## 2016-01-12 DIAGNOSIS — L89313 Pressure ulcer of right buttock, stage 3: Secondary | ICD-10-CM | POA: Diagnosis not present

## 2016-01-12 DIAGNOSIS — I10 Essential (primary) hypertension: Secondary | ICD-10-CM | POA: Diagnosis not present

## 2016-01-12 DIAGNOSIS — R51 Headache: Secondary | ICD-10-CM | POA: Diagnosis not present

## 2016-01-12 DIAGNOSIS — R7303 Prediabetes: Secondary | ICD-10-CM | POA: Diagnosis not present

## 2016-01-12 DIAGNOSIS — Z981 Arthrodesis status: Secondary | ICD-10-CM | POA: Diagnosis not present

## 2016-01-12 DIAGNOSIS — N319 Neuromuscular dysfunction of bladder, unspecified: Secondary | ICD-10-CM | POA: Diagnosis not present

## 2016-01-12 DIAGNOSIS — G808 Other cerebral palsy: Secondary | ICD-10-CM | POA: Diagnosis not present

## 2016-01-12 DIAGNOSIS — Z4800 Encounter for change or removal of nonsurgical wound dressing: Secondary | ICD-10-CM | POA: Diagnosis not present

## 2016-01-12 DIAGNOSIS — Z982 Presence of cerebrospinal fluid drainage device: Secondary | ICD-10-CM | POA: Diagnosis not present

## 2016-01-12 DIAGNOSIS — Q052 Lumbar spina bifida with hydrocephalus: Secondary | ICD-10-CM | POA: Diagnosis not present

## 2016-01-12 DIAGNOSIS — K592 Neurogenic bowel, not elsewhere classified: Secondary | ICD-10-CM | POA: Diagnosis not present

## 2016-01-14 ENCOUNTER — Encounter (HOSPITAL_BASED_OUTPATIENT_CLINIC_OR_DEPARTMENT_OTHER): Payer: Medicare Other | Attending: Internal Medicine

## 2016-01-14 DIAGNOSIS — Z9049 Acquired absence of other specified parts of digestive tract: Secondary | ICD-10-CM | POA: Diagnosis not present

## 2016-01-14 DIAGNOSIS — Z932 Ileostomy status: Secondary | ICD-10-CM | POA: Insufficient documentation

## 2016-01-14 DIAGNOSIS — Q0703 Arnold-Chiari syndrome with spina bifida and hydrocephalus: Secondary | ICD-10-CM | POA: Diagnosis not present

## 2016-01-14 DIAGNOSIS — L89313 Pressure ulcer of right buttock, stage 3: Secondary | ICD-10-CM | POA: Diagnosis not present

## 2016-01-14 DIAGNOSIS — G8220 Paraplegia, unspecified: Secondary | ICD-10-CM | POA: Insufficient documentation

## 2016-01-14 DIAGNOSIS — L89892 Pressure ulcer of other site, stage 2: Secondary | ICD-10-CM | POA: Insufficient documentation

## 2016-01-14 DIAGNOSIS — I10 Essential (primary) hypertension: Secondary | ICD-10-CM | POA: Insufficient documentation

## 2016-01-19 DIAGNOSIS — Z87440 Personal history of urinary (tract) infections: Secondary | ICD-10-CM | POA: Diagnosis not present

## 2016-01-19 DIAGNOSIS — Z982 Presence of cerebrospinal fluid drainage device: Secondary | ICD-10-CM | POA: Diagnosis not present

## 2016-01-19 DIAGNOSIS — K592 Neurogenic bowel, not elsewhere classified: Secondary | ICD-10-CM | POA: Diagnosis not present

## 2016-01-19 DIAGNOSIS — G808 Other cerebral palsy: Secondary | ICD-10-CM | POA: Diagnosis not present

## 2016-01-19 DIAGNOSIS — Z4800 Encounter for change or removal of nonsurgical wound dressing: Secondary | ICD-10-CM | POA: Diagnosis not present

## 2016-01-19 DIAGNOSIS — Z981 Arthrodesis status: Secondary | ICD-10-CM | POA: Diagnosis not present

## 2016-01-19 DIAGNOSIS — Q052 Lumbar spina bifida with hydrocephalus: Secondary | ICD-10-CM | POA: Diagnosis not present

## 2016-01-19 DIAGNOSIS — N319 Neuromuscular dysfunction of bladder, unspecified: Secondary | ICD-10-CM | POA: Diagnosis not present

## 2016-01-19 DIAGNOSIS — R51 Headache: Secondary | ICD-10-CM | POA: Diagnosis not present

## 2016-01-19 DIAGNOSIS — R7303 Prediabetes: Secondary | ICD-10-CM | POA: Diagnosis not present

## 2016-01-19 DIAGNOSIS — L89313 Pressure ulcer of right buttock, stage 3: Secondary | ICD-10-CM | POA: Diagnosis not present

## 2016-01-19 DIAGNOSIS — I10 Essential (primary) hypertension: Secondary | ICD-10-CM | POA: Diagnosis not present

## 2016-01-21 DIAGNOSIS — Z932 Ileostomy status: Secondary | ICD-10-CM | POA: Diagnosis not present

## 2016-01-21 DIAGNOSIS — Z9049 Acquired absence of other specified parts of digestive tract: Secondary | ICD-10-CM | POA: Diagnosis not present

## 2016-01-21 DIAGNOSIS — G8220 Paraplegia, unspecified: Secondary | ICD-10-CM | POA: Diagnosis not present

## 2016-01-21 DIAGNOSIS — L89892 Pressure ulcer of other site, stage 2: Secondary | ICD-10-CM | POA: Diagnosis not present

## 2016-01-21 DIAGNOSIS — L89313 Pressure ulcer of right buttock, stage 3: Secondary | ICD-10-CM | POA: Diagnosis not present

## 2016-01-21 DIAGNOSIS — Q0703 Arnold-Chiari syndrome with spina bifida and hydrocephalus: Secondary | ICD-10-CM | POA: Diagnosis not present

## 2016-01-21 DIAGNOSIS — I10 Essential (primary) hypertension: Secondary | ICD-10-CM | POA: Diagnosis not present

## 2016-01-21 DIAGNOSIS — Q0701 Arnold-Chiari syndrome with spina bifida: Secondary | ICD-10-CM | POA: Diagnosis not present

## 2016-01-27 DIAGNOSIS — Z981 Arthrodesis status: Secondary | ICD-10-CM | POA: Diagnosis not present

## 2016-01-27 DIAGNOSIS — Z4800 Encounter for change or removal of nonsurgical wound dressing: Secondary | ICD-10-CM | POA: Diagnosis not present

## 2016-01-27 DIAGNOSIS — R51 Headache: Secondary | ICD-10-CM | POA: Diagnosis not present

## 2016-01-27 DIAGNOSIS — K592 Neurogenic bowel, not elsewhere classified: Secondary | ICD-10-CM | POA: Diagnosis not present

## 2016-01-27 DIAGNOSIS — N319 Neuromuscular dysfunction of bladder, unspecified: Secondary | ICD-10-CM | POA: Diagnosis not present

## 2016-01-27 DIAGNOSIS — R7303 Prediabetes: Secondary | ICD-10-CM | POA: Diagnosis not present

## 2016-01-27 DIAGNOSIS — G808 Other cerebral palsy: Secondary | ICD-10-CM | POA: Diagnosis not present

## 2016-01-27 DIAGNOSIS — Z982 Presence of cerebrospinal fluid drainage device: Secondary | ICD-10-CM | POA: Diagnosis not present

## 2016-01-27 DIAGNOSIS — L89313 Pressure ulcer of right buttock, stage 3: Secondary | ICD-10-CM | POA: Diagnosis not present

## 2016-01-27 DIAGNOSIS — Z87440 Personal history of urinary (tract) infections: Secondary | ICD-10-CM | POA: Diagnosis not present

## 2016-01-27 DIAGNOSIS — I10 Essential (primary) hypertension: Secondary | ICD-10-CM | POA: Diagnosis not present

## 2016-01-27 DIAGNOSIS — Q052 Lumbar spina bifida with hydrocephalus: Secondary | ICD-10-CM | POA: Diagnosis not present

## 2016-01-29 DIAGNOSIS — L89313 Pressure ulcer of right buttock, stage 3: Secondary | ICD-10-CM | POA: Diagnosis not present

## 2016-01-29 DIAGNOSIS — L89892 Pressure ulcer of other site, stage 2: Secondary | ICD-10-CM | POA: Diagnosis not present

## 2016-01-29 DIAGNOSIS — Z932 Ileostomy status: Secondary | ICD-10-CM | POA: Diagnosis not present

## 2016-01-29 DIAGNOSIS — I10 Essential (primary) hypertension: Secondary | ICD-10-CM | POA: Diagnosis not present

## 2016-01-29 DIAGNOSIS — Z9049 Acquired absence of other specified parts of digestive tract: Secondary | ICD-10-CM | POA: Diagnosis not present

## 2016-01-29 DIAGNOSIS — Q0703 Arnold-Chiari syndrome with spina bifida and hydrocephalus: Secondary | ICD-10-CM | POA: Diagnosis not present

## 2016-01-29 DIAGNOSIS — Z09 Encounter for follow-up examination after completed treatment for conditions other than malignant neoplasm: Secondary | ICD-10-CM | POA: Diagnosis not present

## 2016-01-29 DIAGNOSIS — G8220 Paraplegia, unspecified: Secondary | ICD-10-CM | POA: Diagnosis not present

## 2016-01-29 DIAGNOSIS — Z872 Personal history of diseases of the skin and subcutaneous tissue: Secondary | ICD-10-CM | POA: Diagnosis not present

## 2016-02-02 DIAGNOSIS — Z982 Presence of cerebrospinal fluid drainage device: Secondary | ICD-10-CM | POA: Diagnosis not present

## 2016-02-02 DIAGNOSIS — I959 Hypotension, unspecified: Secondary | ICD-10-CM | POA: Diagnosis not present

## 2016-02-02 DIAGNOSIS — N319 Neuromuscular dysfunction of bladder, unspecified: Secondary | ICD-10-CM | POA: Diagnosis not present

## 2016-02-02 DIAGNOSIS — Z981 Arthrodesis status: Secondary | ICD-10-CM | POA: Diagnosis not present

## 2016-02-02 DIAGNOSIS — L89313 Pressure ulcer of right buttock, stage 3: Secondary | ICD-10-CM | POA: Diagnosis not present

## 2016-02-02 DIAGNOSIS — Z932 Ileostomy status: Secondary | ICD-10-CM | POA: Diagnosis not present

## 2016-02-02 DIAGNOSIS — Z4800 Encounter for change or removal of nonsurgical wound dressing: Secondary | ICD-10-CM | POA: Diagnosis not present

## 2016-02-02 DIAGNOSIS — K592 Neurogenic bowel, not elsewhere classified: Secondary | ICD-10-CM | POA: Diagnosis not present

## 2016-02-02 DIAGNOSIS — I10 Essential (primary) hypertension: Secondary | ICD-10-CM | POA: Diagnosis not present

## 2016-02-02 DIAGNOSIS — Q052 Lumbar spina bifida with hydrocephalus: Secondary | ICD-10-CM | POA: Diagnosis not present

## 2016-02-02 DIAGNOSIS — Z87440 Personal history of urinary (tract) infections: Secondary | ICD-10-CM | POA: Diagnosis not present

## 2016-02-02 DIAGNOSIS — G808 Other cerebral palsy: Secondary | ICD-10-CM | POA: Diagnosis not present

## 2016-02-04 DIAGNOSIS — Z888 Allergy status to other drugs, medicaments and biological substances status: Secondary | ICD-10-CM | POA: Diagnosis not present

## 2016-02-04 DIAGNOSIS — R4189 Other symptoms and signs involving cognitive functions and awareness: Secondary | ICD-10-CM | POA: Diagnosis not present

## 2016-02-04 DIAGNOSIS — R51 Headache: Secondary | ICD-10-CM | POA: Diagnosis not present

## 2016-02-04 DIAGNOSIS — Z881 Allergy status to other antibiotic agents status: Secondary | ICD-10-CM | POA: Diagnosis not present

## 2016-02-04 DIAGNOSIS — I10 Essential (primary) hypertension: Secondary | ICD-10-CM | POA: Diagnosis not present

## 2016-02-04 DIAGNOSIS — Z982 Presence of cerebrospinal fluid drainage device: Secondary | ICD-10-CM | POA: Diagnosis not present

## 2016-02-04 DIAGNOSIS — Z9049 Acquired absence of other specified parts of digestive tract: Secondary | ICD-10-CM | POA: Diagnosis not present

## 2016-02-04 DIAGNOSIS — G8220 Paraplegia, unspecified: Secondary | ICD-10-CM | POA: Diagnosis not present

## 2016-02-10 DIAGNOSIS — K5732 Diverticulitis of large intestine without perforation or abscess without bleeding: Secondary | ICD-10-CM | POA: Diagnosis not present

## 2016-02-10 DIAGNOSIS — Z932 Ileostomy status: Secondary | ICD-10-CM | POA: Diagnosis not present

## 2016-02-11 DIAGNOSIS — I959 Hypotension, unspecified: Secondary | ICD-10-CM | POA: Diagnosis not present

## 2016-02-11 DIAGNOSIS — Z981 Arthrodesis status: Secondary | ICD-10-CM | POA: Diagnosis not present

## 2016-02-11 DIAGNOSIS — Z932 Ileostomy status: Secondary | ICD-10-CM | POA: Diagnosis not present

## 2016-02-11 DIAGNOSIS — K592 Neurogenic bowel, not elsewhere classified: Secondary | ICD-10-CM | POA: Diagnosis not present

## 2016-02-11 DIAGNOSIS — I10 Essential (primary) hypertension: Secondary | ICD-10-CM | POA: Diagnosis not present

## 2016-02-11 DIAGNOSIS — Q052 Lumbar spina bifida with hydrocephalus: Secondary | ICD-10-CM | POA: Diagnosis not present

## 2016-02-11 DIAGNOSIS — N319 Neuromuscular dysfunction of bladder, unspecified: Secondary | ICD-10-CM | POA: Diagnosis not present

## 2016-02-11 DIAGNOSIS — L89309 Pressure ulcer of unspecified buttock, unspecified stage: Secondary | ICD-10-CM | POA: Diagnosis not present

## 2016-02-11 DIAGNOSIS — L89322 Pressure ulcer of left buttock, stage 2: Secondary | ICD-10-CM | POA: Diagnosis not present

## 2016-02-11 DIAGNOSIS — Z87440 Personal history of urinary (tract) infections: Secondary | ICD-10-CM | POA: Diagnosis not present

## 2016-02-11 DIAGNOSIS — Z982 Presence of cerebrospinal fluid drainage device: Secondary | ICD-10-CM | POA: Diagnosis not present

## 2016-02-11 DIAGNOSIS — Z4800 Encounter for change or removal of nonsurgical wound dressing: Secondary | ICD-10-CM | POA: Diagnosis not present

## 2016-02-11 DIAGNOSIS — G808 Other cerebral palsy: Secondary | ICD-10-CM | POA: Diagnosis not present

## 2016-02-11 DIAGNOSIS — K420 Umbilical hernia with obstruction, without gangrene: Secondary | ICD-10-CM | POA: Diagnosis not present

## 2016-02-11 DIAGNOSIS — L89313 Pressure ulcer of right buttock, stage 3: Secondary | ICD-10-CM | POA: Diagnosis not present

## 2016-02-15 ENCOUNTER — Encounter (HOSPITAL_BASED_OUTPATIENT_CLINIC_OR_DEPARTMENT_OTHER): Payer: Medicare Other | Attending: Internal Medicine

## 2016-02-15 DIAGNOSIS — Q0701 Arnold-Chiari syndrome with spina bifida: Secondary | ICD-10-CM | POA: Diagnosis not present

## 2016-02-15 DIAGNOSIS — I10 Essential (primary) hypertension: Secondary | ICD-10-CM | POA: Diagnosis not present

## 2016-02-15 DIAGNOSIS — L89312 Pressure ulcer of right buttock, stage 2: Secondary | ICD-10-CM | POA: Diagnosis not present

## 2016-02-15 DIAGNOSIS — B9561 Methicillin susceptible Staphylococcus aureus infection as the cause of diseases classified elsewhere: Secondary | ICD-10-CM | POA: Insufficient documentation

## 2016-02-15 DIAGNOSIS — L89313 Pressure ulcer of right buttock, stage 3: Secondary | ICD-10-CM | POA: Diagnosis not present

## 2016-02-15 DIAGNOSIS — Q0703 Arnold-Chiari syndrome with spina bifida and hydrocephalus: Secondary | ICD-10-CM | POA: Insufficient documentation

## 2016-02-15 DIAGNOSIS — G8220 Paraplegia, unspecified: Secondary | ICD-10-CM | POA: Diagnosis not present

## 2016-02-15 DIAGNOSIS — Z932 Ileostomy status: Secondary | ICD-10-CM | POA: Insufficient documentation

## 2016-02-16 DIAGNOSIS — Z0000 Encounter for general adult medical examination without abnormal findings: Secondary | ICD-10-CM | POA: Diagnosis not present

## 2016-02-18 DIAGNOSIS — Z932 Ileostomy status: Secondary | ICD-10-CM | POA: Diagnosis not present

## 2016-02-18 DIAGNOSIS — Z981 Arthrodesis status: Secondary | ICD-10-CM | POA: Diagnosis not present

## 2016-02-18 DIAGNOSIS — Z4800 Encounter for change or removal of nonsurgical wound dressing: Secondary | ICD-10-CM | POA: Diagnosis not present

## 2016-02-18 DIAGNOSIS — L89313 Pressure ulcer of right buttock, stage 3: Secondary | ICD-10-CM | POA: Diagnosis not present

## 2016-02-18 DIAGNOSIS — Z87440 Personal history of urinary (tract) infections: Secondary | ICD-10-CM | POA: Diagnosis not present

## 2016-02-18 DIAGNOSIS — G808 Other cerebral palsy: Secondary | ICD-10-CM | POA: Diagnosis not present

## 2016-02-18 DIAGNOSIS — I959 Hypotension, unspecified: Secondary | ICD-10-CM | POA: Diagnosis not present

## 2016-02-18 DIAGNOSIS — Z982 Presence of cerebrospinal fluid drainage device: Secondary | ICD-10-CM | POA: Diagnosis not present

## 2016-02-18 DIAGNOSIS — K592 Neurogenic bowel, not elsewhere classified: Secondary | ICD-10-CM | POA: Diagnosis not present

## 2016-02-18 DIAGNOSIS — N319 Neuromuscular dysfunction of bladder, unspecified: Secondary | ICD-10-CM | POA: Diagnosis not present

## 2016-02-18 DIAGNOSIS — Q052 Lumbar spina bifida with hydrocephalus: Secondary | ICD-10-CM | POA: Diagnosis not present

## 2016-02-18 DIAGNOSIS — I10 Essential (primary) hypertension: Secondary | ICD-10-CM | POA: Diagnosis not present

## 2016-02-22 DIAGNOSIS — I10 Essential (primary) hypertension: Secondary | ICD-10-CM | POA: Diagnosis not present

## 2016-02-22 DIAGNOSIS — L89312 Pressure ulcer of right buttock, stage 2: Secondary | ICD-10-CM | POA: Diagnosis not present

## 2016-02-22 DIAGNOSIS — Q0703 Arnold-Chiari syndrome with spina bifida and hydrocephalus: Secondary | ICD-10-CM | POA: Diagnosis not present

## 2016-02-22 DIAGNOSIS — B9561 Methicillin susceptible Staphylococcus aureus infection as the cause of diseases classified elsewhere: Secondary | ICD-10-CM | POA: Diagnosis not present

## 2016-02-22 DIAGNOSIS — Z932 Ileostomy status: Secondary | ICD-10-CM | POA: Diagnosis not present

## 2016-02-22 DIAGNOSIS — G8220 Paraplegia, unspecified: Secondary | ICD-10-CM | POA: Diagnosis not present

## 2016-02-22 DIAGNOSIS — L89313 Pressure ulcer of right buttock, stage 3: Secondary | ICD-10-CM | POA: Diagnosis not present

## 2016-02-25 DIAGNOSIS — Z87440 Personal history of urinary (tract) infections: Secondary | ICD-10-CM | POA: Diagnosis not present

## 2016-02-25 DIAGNOSIS — Q052 Lumbar spina bifida with hydrocephalus: Secondary | ICD-10-CM | POA: Diagnosis not present

## 2016-02-25 DIAGNOSIS — N319 Neuromuscular dysfunction of bladder, unspecified: Secondary | ICD-10-CM | POA: Diagnosis not present

## 2016-02-25 DIAGNOSIS — I959 Hypotension, unspecified: Secondary | ICD-10-CM | POA: Diagnosis not present

## 2016-02-25 DIAGNOSIS — K592 Neurogenic bowel, not elsewhere classified: Secondary | ICD-10-CM | POA: Diagnosis not present

## 2016-02-25 DIAGNOSIS — Z932 Ileostomy status: Secondary | ICD-10-CM | POA: Diagnosis not present

## 2016-02-25 DIAGNOSIS — L89313 Pressure ulcer of right buttock, stage 3: Secondary | ICD-10-CM | POA: Diagnosis not present

## 2016-02-25 DIAGNOSIS — Z982 Presence of cerebrospinal fluid drainage device: Secondary | ICD-10-CM | POA: Diagnosis not present

## 2016-02-25 DIAGNOSIS — Z981 Arthrodesis status: Secondary | ICD-10-CM | POA: Diagnosis not present

## 2016-02-25 DIAGNOSIS — Z4800 Encounter for change or removal of nonsurgical wound dressing: Secondary | ICD-10-CM | POA: Diagnosis not present

## 2016-02-25 DIAGNOSIS — I10 Essential (primary) hypertension: Secondary | ICD-10-CM | POA: Diagnosis not present

## 2016-02-25 DIAGNOSIS — G808 Other cerebral palsy: Secondary | ICD-10-CM | POA: Diagnosis not present

## 2016-02-29 DIAGNOSIS — Q0703 Arnold-Chiari syndrome with spina bifida and hydrocephalus: Secondary | ICD-10-CM | POA: Diagnosis not present

## 2016-02-29 DIAGNOSIS — Z932 Ileostomy status: Secondary | ICD-10-CM | POA: Diagnosis not present

## 2016-02-29 DIAGNOSIS — G8220 Paraplegia, unspecified: Secondary | ICD-10-CM | POA: Diagnosis not present

## 2016-02-29 DIAGNOSIS — L89313 Pressure ulcer of right buttock, stage 3: Secondary | ICD-10-CM | POA: Diagnosis not present

## 2016-02-29 DIAGNOSIS — B9561 Methicillin susceptible Staphylococcus aureus infection as the cause of diseases classified elsewhere: Secondary | ICD-10-CM | POA: Diagnosis not present

## 2016-02-29 DIAGNOSIS — I10 Essential (primary) hypertension: Secondary | ICD-10-CM | POA: Diagnosis not present

## 2016-02-29 DIAGNOSIS — L89312 Pressure ulcer of right buttock, stage 2: Secondary | ICD-10-CM | POA: Diagnosis not present

## 2016-03-03 DIAGNOSIS — Z981 Arthrodesis status: Secondary | ICD-10-CM | POA: Diagnosis not present

## 2016-03-03 DIAGNOSIS — Z932 Ileostomy status: Secondary | ICD-10-CM | POA: Diagnosis not present

## 2016-03-03 DIAGNOSIS — Z982 Presence of cerebrospinal fluid drainage device: Secondary | ICD-10-CM | POA: Diagnosis not present

## 2016-03-03 DIAGNOSIS — L89313 Pressure ulcer of right buttock, stage 3: Secondary | ICD-10-CM | POA: Diagnosis not present

## 2016-03-03 DIAGNOSIS — I959 Hypotension, unspecified: Secondary | ICD-10-CM | POA: Diagnosis not present

## 2016-03-03 DIAGNOSIS — N319 Neuromuscular dysfunction of bladder, unspecified: Secondary | ICD-10-CM | POA: Diagnosis not present

## 2016-03-03 DIAGNOSIS — Z87440 Personal history of urinary (tract) infections: Secondary | ICD-10-CM | POA: Diagnosis not present

## 2016-03-03 DIAGNOSIS — I10 Essential (primary) hypertension: Secondary | ICD-10-CM | POA: Diagnosis not present

## 2016-03-03 DIAGNOSIS — Q052 Lumbar spina bifida with hydrocephalus: Secondary | ICD-10-CM | POA: Diagnosis not present

## 2016-03-03 DIAGNOSIS — K592 Neurogenic bowel, not elsewhere classified: Secondary | ICD-10-CM | POA: Diagnosis not present

## 2016-03-03 DIAGNOSIS — G808 Other cerebral palsy: Secondary | ICD-10-CM | POA: Diagnosis not present

## 2016-03-03 DIAGNOSIS — Z4800 Encounter for change or removal of nonsurgical wound dressing: Secondary | ICD-10-CM | POA: Diagnosis not present

## 2016-03-07 DIAGNOSIS — Z932 Ileostomy status: Secondary | ICD-10-CM | POA: Diagnosis not present

## 2016-03-07 DIAGNOSIS — G8220 Paraplegia, unspecified: Secondary | ICD-10-CM | POA: Diagnosis not present

## 2016-03-07 DIAGNOSIS — I10 Essential (primary) hypertension: Secondary | ICD-10-CM | POA: Diagnosis not present

## 2016-03-07 DIAGNOSIS — B9561 Methicillin susceptible Staphylococcus aureus infection as the cause of diseases classified elsewhere: Secondary | ICD-10-CM | POA: Diagnosis not present

## 2016-03-07 DIAGNOSIS — Q0703 Arnold-Chiari syndrome with spina bifida and hydrocephalus: Secondary | ICD-10-CM | POA: Diagnosis not present

## 2016-03-07 DIAGNOSIS — L89312 Pressure ulcer of right buttock, stage 2: Secondary | ICD-10-CM | POA: Diagnosis not present

## 2016-03-10 DIAGNOSIS — L89313 Pressure ulcer of right buttock, stage 3: Secondary | ICD-10-CM | POA: Diagnosis not present

## 2016-03-10 DIAGNOSIS — G808 Other cerebral palsy: Secondary | ICD-10-CM | POA: Diagnosis not present

## 2016-03-10 DIAGNOSIS — K592 Neurogenic bowel, not elsewhere classified: Secondary | ICD-10-CM | POA: Diagnosis not present

## 2016-03-10 DIAGNOSIS — Z932 Ileostomy status: Secondary | ICD-10-CM | POA: Diagnosis not present

## 2016-03-10 DIAGNOSIS — Q052 Lumbar spina bifida with hydrocephalus: Secondary | ICD-10-CM | POA: Diagnosis not present

## 2016-03-10 DIAGNOSIS — N319 Neuromuscular dysfunction of bladder, unspecified: Secondary | ICD-10-CM | POA: Diagnosis not present

## 2016-03-10 DIAGNOSIS — I959 Hypotension, unspecified: Secondary | ICD-10-CM | POA: Diagnosis not present

## 2016-03-10 DIAGNOSIS — Z982 Presence of cerebrospinal fluid drainage device: Secondary | ICD-10-CM | POA: Diagnosis not present

## 2016-03-10 DIAGNOSIS — Z4800 Encounter for change or removal of nonsurgical wound dressing: Secondary | ICD-10-CM | POA: Diagnosis not present

## 2016-03-10 DIAGNOSIS — Z87440 Personal history of urinary (tract) infections: Secondary | ICD-10-CM | POA: Diagnosis not present

## 2016-03-10 DIAGNOSIS — I10 Essential (primary) hypertension: Secondary | ICD-10-CM | POA: Diagnosis not present

## 2016-03-10 DIAGNOSIS — Z981 Arthrodesis status: Secondary | ICD-10-CM | POA: Diagnosis not present

## 2016-03-11 DIAGNOSIS — E782 Mixed hyperlipidemia: Secondary | ICD-10-CM | POA: Diagnosis not present

## 2016-03-11 DIAGNOSIS — I10 Essential (primary) hypertension: Secondary | ICD-10-CM | POA: Diagnosis not present

## 2016-03-11 DIAGNOSIS — Q054 Unspecified spina bifida with hydrocephalus: Secondary | ICD-10-CM | POA: Diagnosis not present

## 2016-03-12 DIAGNOSIS — L89322 Pressure ulcer of left buttock, stage 2: Secondary | ICD-10-CM | POA: Diagnosis not present

## 2016-03-12 DIAGNOSIS — L89312 Pressure ulcer of right buttock, stage 2: Secondary | ICD-10-CM | POA: Diagnosis not present

## 2016-03-12 DIAGNOSIS — L89309 Pressure ulcer of unspecified buttock, unspecified stage: Secondary | ICD-10-CM | POA: Diagnosis not present

## 2016-03-12 DIAGNOSIS — K420 Umbilical hernia with obstruction, without gangrene: Secondary | ICD-10-CM | POA: Diagnosis not present

## 2016-03-14 ENCOUNTER — Encounter (HOSPITAL_BASED_OUTPATIENT_CLINIC_OR_DEPARTMENT_OTHER): Payer: Medicare Other | Attending: Internal Medicine

## 2016-03-14 DIAGNOSIS — L89312 Pressure ulcer of right buttock, stage 2: Secondary | ICD-10-CM | POA: Diagnosis not present

## 2016-03-14 DIAGNOSIS — Z932 Ileostomy status: Secondary | ICD-10-CM | POA: Insufficient documentation

## 2016-03-14 DIAGNOSIS — Q0703 Arnold-Chiari syndrome with spina bifida and hydrocephalus: Secondary | ICD-10-CM | POA: Insufficient documentation

## 2016-03-14 DIAGNOSIS — G8220 Paraplegia, unspecified: Secondary | ICD-10-CM | POA: Insufficient documentation

## 2016-03-14 DIAGNOSIS — I10 Essential (primary) hypertension: Secondary | ICD-10-CM | POA: Diagnosis not present

## 2016-03-14 DIAGNOSIS — L89313 Pressure ulcer of right buttock, stage 3: Secondary | ICD-10-CM | POA: Diagnosis not present

## 2016-03-14 DIAGNOSIS — L98491 Non-pressure chronic ulcer of skin of other sites limited to breakdown of skin: Secondary | ICD-10-CM | POA: Diagnosis not present

## 2016-03-16 DIAGNOSIS — Z4800 Encounter for change or removal of nonsurgical wound dressing: Secondary | ICD-10-CM | POA: Diagnosis not present

## 2016-03-16 DIAGNOSIS — I959 Hypotension, unspecified: Secondary | ICD-10-CM | POA: Diagnosis not present

## 2016-03-16 DIAGNOSIS — Q052 Lumbar spina bifida with hydrocephalus: Secondary | ICD-10-CM | POA: Diagnosis not present

## 2016-03-16 DIAGNOSIS — K592 Neurogenic bowel, not elsewhere classified: Secondary | ICD-10-CM | POA: Diagnosis not present

## 2016-03-16 DIAGNOSIS — G808 Other cerebral palsy: Secondary | ICD-10-CM | POA: Diagnosis not present

## 2016-03-16 DIAGNOSIS — Z932 Ileostomy status: Secondary | ICD-10-CM | POA: Diagnosis not present

## 2016-03-16 DIAGNOSIS — L89313 Pressure ulcer of right buttock, stage 3: Secondary | ICD-10-CM | POA: Diagnosis not present

## 2016-03-16 DIAGNOSIS — I10 Essential (primary) hypertension: Secondary | ICD-10-CM | POA: Diagnosis not present

## 2016-03-16 DIAGNOSIS — N319 Neuromuscular dysfunction of bladder, unspecified: Secondary | ICD-10-CM | POA: Diagnosis not present

## 2016-03-16 DIAGNOSIS — Z981 Arthrodesis status: Secondary | ICD-10-CM | POA: Diagnosis not present

## 2016-03-16 DIAGNOSIS — Z982 Presence of cerebrospinal fluid drainage device: Secondary | ICD-10-CM | POA: Diagnosis not present

## 2016-03-16 DIAGNOSIS — Z87440 Personal history of urinary (tract) infections: Secondary | ICD-10-CM | POA: Diagnosis not present

## 2016-03-21 DIAGNOSIS — G43809 Other migraine, not intractable, without status migrainosus: Secondary | ICD-10-CM | POA: Diagnosis not present

## 2016-03-21 DIAGNOSIS — Z91040 Latex allergy status: Secondary | ICD-10-CM | POA: Diagnosis not present

## 2016-03-21 DIAGNOSIS — R51 Headache: Secondary | ICD-10-CM | POA: Diagnosis not present

## 2016-03-21 DIAGNOSIS — Z881 Allergy status to other antibiotic agents status: Secondary | ICD-10-CM | POA: Diagnosis not present

## 2016-03-21 DIAGNOSIS — R41840 Attention and concentration deficit: Secondary | ICD-10-CM | POA: Diagnosis not present

## 2016-03-21 DIAGNOSIS — I10 Essential (primary) hypertension: Secondary | ICD-10-CM | POA: Diagnosis not present

## 2016-03-21 DIAGNOSIS — Z932 Ileostomy status: Secondary | ICD-10-CM | POA: Diagnosis not present

## 2016-03-21 DIAGNOSIS — R404 Transient alteration of awareness: Secondary | ICD-10-CM | POA: Diagnosis not present

## 2016-03-21 DIAGNOSIS — Z9049 Acquired absence of other specified parts of digestive tract: Secondary | ICD-10-CM | POA: Diagnosis not present

## 2016-03-21 DIAGNOSIS — Z982 Presence of cerebrospinal fluid drainage device: Secondary | ICD-10-CM | POA: Diagnosis not present

## 2016-03-21 DIAGNOSIS — Q059 Spina bifida, unspecified: Secondary | ICD-10-CM | POA: Diagnosis not present

## 2016-03-21 DIAGNOSIS — Q051 Thoracic spina bifida with hydrocephalus: Secondary | ICD-10-CM | POA: Diagnosis not present

## 2016-03-21 DIAGNOSIS — G8220 Paraplegia, unspecified: Secondary | ICD-10-CM | POA: Diagnosis not present

## 2016-03-21 DIAGNOSIS — R4781 Slurred speech: Secondary | ICD-10-CM | POA: Diagnosis not present

## 2016-03-21 DIAGNOSIS — Z8249 Family history of ischemic heart disease and other diseases of the circulatory system: Secondary | ICD-10-CM | POA: Diagnosis not present

## 2016-03-21 DIAGNOSIS — R6250 Unspecified lack of expected normal physiological development in childhood: Secondary | ICD-10-CM | POA: Diagnosis not present

## 2016-03-26 DIAGNOSIS — Z982 Presence of cerebrospinal fluid drainage device: Secondary | ICD-10-CM | POA: Diagnosis not present

## 2016-03-26 DIAGNOSIS — Z932 Ileostomy status: Secondary | ICD-10-CM | POA: Diagnosis not present

## 2016-03-26 DIAGNOSIS — L89313 Pressure ulcer of right buttock, stage 3: Secondary | ICD-10-CM | POA: Diagnosis not present

## 2016-03-26 DIAGNOSIS — G8220 Paraplegia, unspecified: Secondary | ICD-10-CM | POA: Diagnosis not present

## 2016-03-26 DIAGNOSIS — G43809 Other migraine, not intractable, without status migrainosus: Secondary | ICD-10-CM | POA: Diagnosis not present

## 2016-03-26 DIAGNOSIS — Q051 Thoracic spina bifida with hydrocephalus: Secondary | ICD-10-CM | POA: Diagnosis not present

## 2016-03-26 DIAGNOSIS — N319 Neuromuscular dysfunction of bladder, unspecified: Secondary | ICD-10-CM | POA: Diagnosis not present

## 2016-03-26 DIAGNOSIS — Z79891 Long term (current) use of opiate analgesic: Secondary | ICD-10-CM | POA: Diagnosis not present

## 2016-03-26 DIAGNOSIS — K592 Neurogenic bowel, not elsewhere classified: Secondary | ICD-10-CM | POA: Diagnosis not present

## 2016-03-26 DIAGNOSIS — G935 Compression of brain: Secondary | ICD-10-CM | POA: Diagnosis not present

## 2016-03-26 DIAGNOSIS — E46 Unspecified protein-calorie malnutrition: Secondary | ICD-10-CM | POA: Diagnosis not present

## 2016-03-26 DIAGNOSIS — I10 Essential (primary) hypertension: Secondary | ICD-10-CM | POA: Diagnosis not present

## 2016-03-28 DIAGNOSIS — Z932 Ileostomy status: Secondary | ICD-10-CM | POA: Diagnosis not present

## 2016-03-28 DIAGNOSIS — L98491 Non-pressure chronic ulcer of skin of other sites limited to breakdown of skin: Secondary | ICD-10-CM | POA: Diagnosis not present

## 2016-03-28 DIAGNOSIS — Q0703 Arnold-Chiari syndrome with spina bifida and hydrocephalus: Secondary | ICD-10-CM | POA: Diagnosis not present

## 2016-03-28 DIAGNOSIS — I10 Essential (primary) hypertension: Secondary | ICD-10-CM | POA: Diagnosis not present

## 2016-03-28 DIAGNOSIS — N319 Neuromuscular dysfunction of bladder, unspecified: Secondary | ICD-10-CM | POA: Diagnosis not present

## 2016-03-28 DIAGNOSIS — L89312 Pressure ulcer of right buttock, stage 2: Secondary | ICD-10-CM | POA: Diagnosis not present

## 2016-03-28 DIAGNOSIS — G8220 Paraplegia, unspecified: Secondary | ICD-10-CM | POA: Diagnosis not present

## 2016-03-28 DIAGNOSIS — N3020 Other chronic cystitis without hematuria: Secondary | ICD-10-CM | POA: Diagnosis not present

## 2016-03-31 DIAGNOSIS — Z79891 Long term (current) use of opiate analgesic: Secondary | ICD-10-CM | POA: Diagnosis not present

## 2016-03-31 DIAGNOSIS — G935 Compression of brain: Secondary | ICD-10-CM | POA: Diagnosis not present

## 2016-03-31 DIAGNOSIS — Q051 Thoracic spina bifida with hydrocephalus: Secondary | ICD-10-CM | POA: Diagnosis not present

## 2016-03-31 DIAGNOSIS — I10 Essential (primary) hypertension: Secondary | ICD-10-CM | POA: Diagnosis not present

## 2016-03-31 DIAGNOSIS — K592 Neurogenic bowel, not elsewhere classified: Secondary | ICD-10-CM | POA: Diagnosis not present

## 2016-03-31 DIAGNOSIS — L89313 Pressure ulcer of right buttock, stage 3: Secondary | ICD-10-CM | POA: Diagnosis not present

## 2016-03-31 DIAGNOSIS — N319 Neuromuscular dysfunction of bladder, unspecified: Secondary | ICD-10-CM | POA: Diagnosis not present

## 2016-03-31 DIAGNOSIS — G8220 Paraplegia, unspecified: Secondary | ICD-10-CM | POA: Diagnosis not present

## 2016-03-31 DIAGNOSIS — G43809 Other migraine, not intractable, without status migrainosus: Secondary | ICD-10-CM | POA: Diagnosis not present

## 2016-03-31 DIAGNOSIS — E46 Unspecified protein-calorie malnutrition: Secondary | ICD-10-CM | POA: Diagnosis not present

## 2016-03-31 DIAGNOSIS — Z932 Ileostomy status: Secondary | ICD-10-CM | POA: Diagnosis not present

## 2016-03-31 DIAGNOSIS — Z982 Presence of cerebrospinal fluid drainage device: Secondary | ICD-10-CM | POA: Diagnosis not present

## 2016-04-01 DIAGNOSIS — Z932 Ileostomy status: Secondary | ICD-10-CM | POA: Diagnosis not present

## 2016-04-01 DIAGNOSIS — K5732 Diverticulitis of large intestine without perforation or abscess without bleeding: Secondary | ICD-10-CM | POA: Diagnosis not present

## 2016-04-07 DIAGNOSIS — K5732 Diverticulitis of large intestine without perforation or abscess without bleeding: Secondary | ICD-10-CM | POA: Diagnosis not present

## 2016-04-07 DIAGNOSIS — Z982 Presence of cerebrospinal fluid drainage device: Secondary | ICD-10-CM | POA: Diagnosis not present

## 2016-04-07 DIAGNOSIS — Z79891 Long term (current) use of opiate analgesic: Secondary | ICD-10-CM | POA: Diagnosis not present

## 2016-04-07 DIAGNOSIS — Q051 Thoracic spina bifida with hydrocephalus: Secondary | ICD-10-CM | POA: Diagnosis not present

## 2016-04-07 DIAGNOSIS — I10 Essential (primary) hypertension: Secondary | ICD-10-CM | POA: Diagnosis not present

## 2016-04-07 DIAGNOSIS — K592 Neurogenic bowel, not elsewhere classified: Secondary | ICD-10-CM | POA: Diagnosis not present

## 2016-04-07 DIAGNOSIS — G43809 Other migraine, not intractable, without status migrainosus: Secondary | ICD-10-CM | POA: Diagnosis not present

## 2016-04-07 DIAGNOSIS — Z932 Ileostomy status: Secondary | ICD-10-CM | POA: Diagnosis not present

## 2016-04-07 DIAGNOSIS — N319 Neuromuscular dysfunction of bladder, unspecified: Secondary | ICD-10-CM | POA: Diagnosis not present

## 2016-04-07 DIAGNOSIS — E46 Unspecified protein-calorie malnutrition: Secondary | ICD-10-CM | POA: Diagnosis not present

## 2016-04-07 DIAGNOSIS — L89313 Pressure ulcer of right buttock, stage 3: Secondary | ICD-10-CM | POA: Diagnosis not present

## 2016-04-07 DIAGNOSIS — G935 Compression of brain: Secondary | ICD-10-CM | POA: Diagnosis not present

## 2016-04-07 DIAGNOSIS — G8220 Paraplegia, unspecified: Secondary | ICD-10-CM | POA: Diagnosis not present

## 2016-04-11 DIAGNOSIS — L89312 Pressure ulcer of right buttock, stage 2: Secondary | ICD-10-CM | POA: Diagnosis not present

## 2016-04-11 DIAGNOSIS — L98491 Non-pressure chronic ulcer of skin of other sites limited to breakdown of skin: Secondary | ICD-10-CM | POA: Diagnosis not present

## 2016-04-11 DIAGNOSIS — I10 Essential (primary) hypertension: Secondary | ICD-10-CM | POA: Diagnosis not present

## 2016-04-11 DIAGNOSIS — G8220 Paraplegia, unspecified: Secondary | ICD-10-CM | POA: Diagnosis not present

## 2016-04-11 DIAGNOSIS — Z872 Personal history of diseases of the skin and subcutaneous tissue: Secondary | ICD-10-CM | POA: Diagnosis not present

## 2016-04-11 DIAGNOSIS — K5732 Diverticulitis of large intestine without perforation or abscess without bleeding: Secondary | ICD-10-CM | POA: Diagnosis not present

## 2016-04-11 DIAGNOSIS — Z932 Ileostomy status: Secondary | ICD-10-CM | POA: Diagnosis not present

## 2016-04-11 DIAGNOSIS — Q0703 Arnold-Chiari syndrome with spina bifida and hydrocephalus: Secondary | ICD-10-CM | POA: Diagnosis not present

## 2016-04-11 DIAGNOSIS — Z09 Encounter for follow-up examination after completed treatment for conditions other than malignant neoplasm: Secondary | ICD-10-CM | POA: Diagnosis not present

## 2016-04-12 DIAGNOSIS — L89322 Pressure ulcer of left buttock, stage 2: Secondary | ICD-10-CM | POA: Diagnosis not present

## 2016-04-12 DIAGNOSIS — K420 Umbilical hernia with obstruction, without gangrene: Secondary | ICD-10-CM | POA: Diagnosis not present

## 2016-04-12 DIAGNOSIS — L89309 Pressure ulcer of unspecified buttock, unspecified stage: Secondary | ICD-10-CM | POA: Diagnosis not present

## 2016-04-14 DIAGNOSIS — G8220 Paraplegia, unspecified: Secondary | ICD-10-CM | POA: Diagnosis not present

## 2016-04-14 DIAGNOSIS — E222 Syndrome of inappropriate secretion of antidiuretic hormone: Secondary | ICD-10-CM | POA: Diagnosis not present

## 2016-04-14 DIAGNOSIS — Q051 Thoracic spina bifida with hydrocephalus: Secondary | ICD-10-CM | POA: Diagnosis not present

## 2016-04-14 DIAGNOSIS — N319 Neuromuscular dysfunction of bladder, unspecified: Secondary | ICD-10-CM | POA: Diagnosis not present

## 2016-04-14 DIAGNOSIS — L89313 Pressure ulcer of right buttock, stage 3: Secondary | ICD-10-CM | POA: Diagnosis not present

## 2016-04-14 DIAGNOSIS — G935 Compression of brain: Secondary | ICD-10-CM | POA: Diagnosis not present

## 2016-04-14 DIAGNOSIS — Z79891 Long term (current) use of opiate analgesic: Secondary | ICD-10-CM | POA: Diagnosis not present

## 2016-04-14 DIAGNOSIS — F70 Mild intellectual disabilities: Secondary | ICD-10-CM | POA: Diagnosis not present

## 2016-04-14 DIAGNOSIS — I10 Essential (primary) hypertension: Secondary | ICD-10-CM | POA: Diagnosis not present

## 2016-04-14 DIAGNOSIS — G919 Hydrocephalus, unspecified: Secondary | ICD-10-CM | POA: Diagnosis not present

## 2016-04-14 DIAGNOSIS — E46 Unspecified protein-calorie malnutrition: Secondary | ICD-10-CM | POA: Diagnosis not present

## 2016-04-14 DIAGNOSIS — Z932 Ileostomy status: Secondary | ICD-10-CM | POA: Diagnosis not present

## 2016-04-14 DIAGNOSIS — K592 Neurogenic bowel, not elsewhere classified: Secondary | ICD-10-CM | POA: Diagnosis not present

## 2016-04-14 DIAGNOSIS — G43809 Other migraine, not intractable, without status migrainosus: Secondary | ICD-10-CM | POA: Diagnosis not present

## 2016-04-14 DIAGNOSIS — Z982 Presence of cerebrospinal fluid drainage device: Secondary | ICD-10-CM | POA: Diagnosis not present

## 2016-04-15 DIAGNOSIS — N319 Neuromuscular dysfunction of bladder, unspecified: Secondary | ICD-10-CM | POA: Diagnosis not present

## 2016-04-15 DIAGNOSIS — N3020 Other chronic cystitis without hematuria: Secondary | ICD-10-CM | POA: Diagnosis not present

## 2016-05-13 DIAGNOSIS — L89309 Pressure ulcer of unspecified buttock, unspecified stage: Secondary | ICD-10-CM | POA: Diagnosis not present

## 2016-05-13 DIAGNOSIS — R6889 Other general symptoms and signs: Secondary | ICD-10-CM | POA: Diagnosis not present

## 2016-05-13 DIAGNOSIS — K420 Umbilical hernia with obstruction, without gangrene: Secondary | ICD-10-CM | POA: Diagnosis not present

## 2016-05-13 DIAGNOSIS — R51 Headache: Secondary | ICD-10-CM | POA: Diagnosis not present

## 2016-05-19 DIAGNOSIS — Q051 Thoracic spina bifida with hydrocephalus: Secondary | ICD-10-CM | POA: Diagnosis not present

## 2016-05-26 DIAGNOSIS — Z932 Ileostomy status: Secondary | ICD-10-CM | POA: Diagnosis not present

## 2016-05-26 DIAGNOSIS — Z982 Presence of cerebrospinal fluid drainage device: Secondary | ICD-10-CM | POA: Diagnosis not present

## 2016-05-26 DIAGNOSIS — G935 Compression of brain: Secondary | ICD-10-CM | POA: Diagnosis not present

## 2016-05-26 DIAGNOSIS — Q051 Thoracic spina bifida with hydrocephalus: Secondary | ICD-10-CM | POA: Diagnosis not present

## 2016-05-26 DIAGNOSIS — I10 Essential (primary) hypertension: Secondary | ICD-10-CM | POA: Diagnosis not present

## 2016-05-26 DIAGNOSIS — Z79891 Long term (current) use of opiate analgesic: Secondary | ICD-10-CM | POA: Diagnosis not present

## 2016-05-26 DIAGNOSIS — L89313 Pressure ulcer of right buttock, stage 3: Secondary | ICD-10-CM | POA: Diagnosis not present

## 2016-05-26 DIAGNOSIS — K592 Neurogenic bowel, not elsewhere classified: Secondary | ICD-10-CM | POA: Diagnosis not present

## 2016-05-26 DIAGNOSIS — G8220 Paraplegia, unspecified: Secondary | ICD-10-CM | POA: Diagnosis not present

## 2016-05-26 DIAGNOSIS — E46 Unspecified protein-calorie malnutrition: Secondary | ICD-10-CM | POA: Diagnosis not present

## 2016-05-26 DIAGNOSIS — N319 Neuromuscular dysfunction of bladder, unspecified: Secondary | ICD-10-CM | POA: Diagnosis not present

## 2016-05-26 DIAGNOSIS — G43809 Other migraine, not intractable, without status migrainosus: Secondary | ICD-10-CM | POA: Diagnosis not present

## 2016-06-12 DIAGNOSIS — L89312 Pressure ulcer of right buttock, stage 2: Secondary | ICD-10-CM | POA: Diagnosis not present

## 2016-06-14 DIAGNOSIS — G919 Hydrocephalus, unspecified: Secondary | ICD-10-CM | POA: Diagnosis not present

## 2016-06-14 DIAGNOSIS — E782 Mixed hyperlipidemia: Secondary | ICD-10-CM | POA: Diagnosis not present

## 2016-06-14 DIAGNOSIS — E222 Syndrome of inappropriate secretion of antidiuretic hormone: Secondary | ICD-10-CM | POA: Diagnosis not present

## 2016-06-14 DIAGNOSIS — I10 Essential (primary) hypertension: Secondary | ICD-10-CM | POA: Diagnosis not present

## 2016-06-14 DIAGNOSIS — Z79899 Other long term (current) drug therapy: Secondary | ICD-10-CM | POA: Diagnosis not present

## 2016-06-14 DIAGNOSIS — Q064 Hydromyelia: Secondary | ICD-10-CM | POA: Diagnosis not present

## 2016-06-14 DIAGNOSIS — Z23 Encounter for immunization: Secondary | ICD-10-CM | POA: Diagnosis not present

## 2016-07-07 DIAGNOSIS — Q051 Thoracic spina bifida with hydrocephalus: Secondary | ICD-10-CM | POA: Diagnosis not present

## 2016-07-08 DIAGNOSIS — Z932 Ileostomy status: Secondary | ICD-10-CM | POA: Diagnosis not present

## 2016-07-08 DIAGNOSIS — K5732 Diverticulitis of large intestine without perforation or abscess without bleeding: Secondary | ICD-10-CM | POA: Diagnosis not present

## 2016-07-13 DIAGNOSIS — L89312 Pressure ulcer of right buttock, stage 2: Secondary | ICD-10-CM | POA: Diagnosis not present

## 2016-07-21 DIAGNOSIS — G43109 Migraine with aura, not intractable, without status migrainosus: Secondary | ICD-10-CM | POA: Diagnosis not present

## 2016-07-21 DIAGNOSIS — G43809 Other migraine, not intractable, without status migrainosus: Secondary | ICD-10-CM | POA: Diagnosis not present

## 2016-08-12 DIAGNOSIS — L89312 Pressure ulcer of right buttock, stage 2: Secondary | ICD-10-CM | POA: Diagnosis not present

## 2016-08-15 DIAGNOSIS — Z9889 Other specified postprocedural states: Secondary | ICD-10-CM | POA: Diagnosis not present

## 2016-08-15 DIAGNOSIS — Z932 Ileostomy status: Secondary | ICD-10-CM | POA: Diagnosis not present

## 2016-08-15 DIAGNOSIS — Z993 Dependence on wheelchair: Secondary | ICD-10-CM | POA: Diagnosis not present

## 2016-08-15 DIAGNOSIS — R51 Headache: Secondary | ICD-10-CM | POA: Diagnosis not present

## 2016-08-15 DIAGNOSIS — Z982 Presence of cerebrospinal fluid drainage device: Secondary | ICD-10-CM | POA: Diagnosis not present

## 2016-08-15 DIAGNOSIS — Q051 Thoracic spina bifida with hydrocephalus: Secondary | ICD-10-CM | POA: Diagnosis not present

## 2016-09-12 DIAGNOSIS — L89312 Pressure ulcer of right buttock, stage 2: Secondary | ICD-10-CM | POA: Diagnosis not present

## 2016-09-14 DIAGNOSIS — I10 Essential (primary) hypertension: Secondary | ICD-10-CM | POA: Diagnosis not present

## 2016-09-14 DIAGNOSIS — G919 Hydrocephalus, unspecified: Secondary | ICD-10-CM | POA: Diagnosis not present

## 2016-09-14 DIAGNOSIS — C9190 Lymphoid leukemia, unspecified not having achieved remission: Secondary | ICD-10-CM | POA: Diagnosis not present

## 2016-09-14 DIAGNOSIS — E782 Mixed hyperlipidemia: Secondary | ICD-10-CM | POA: Diagnosis not present

## 2016-09-14 DIAGNOSIS — E7529 Other sphingolipidosis: Secondary | ICD-10-CM | POA: Diagnosis not present

## 2016-09-21 DIAGNOSIS — Q051 Thoracic spina bifida with hydrocephalus: Secondary | ICD-10-CM | POA: Diagnosis not present

## 2016-10-10 DIAGNOSIS — Z932 Ileostomy status: Secondary | ICD-10-CM | POA: Diagnosis not present

## 2016-10-10 DIAGNOSIS — K5732 Diverticulitis of large intestine without perforation or abscess without bleeding: Secondary | ICD-10-CM | POA: Diagnosis not present

## 2016-10-13 DIAGNOSIS — L89312 Pressure ulcer of right buttock, stage 2: Secondary | ICD-10-CM | POA: Diagnosis not present

## 2016-10-24 DIAGNOSIS — N319 Neuromuscular dysfunction of bladder, unspecified: Secondary | ICD-10-CM | POA: Diagnosis not present

## 2016-10-24 DIAGNOSIS — N3020 Other chronic cystitis without hematuria: Secondary | ICD-10-CM | POA: Diagnosis not present

## 2016-11-11 DIAGNOSIS — Q054 Unspecified spina bifida with hydrocephalus: Secondary | ICD-10-CM | POA: Diagnosis not present

## 2016-11-11 DIAGNOSIS — I10 Essential (primary) hypertension: Secondary | ICD-10-CM | POA: Diagnosis not present

## 2016-11-11 DIAGNOSIS — N319 Neuromuscular dysfunction of bladder, unspecified: Secondary | ICD-10-CM | POA: Diagnosis not present

## 2017-01-03 DIAGNOSIS — H01021 Squamous blepharitis right upper eyelid: Secondary | ICD-10-CM | POA: Diagnosis not present

## 2017-01-03 DIAGNOSIS — H53002 Unspecified amblyopia, left eye: Secondary | ICD-10-CM | POA: Diagnosis not present

## 2017-01-03 DIAGNOSIS — H01025 Squamous blepharitis left lower eyelid: Secondary | ICD-10-CM | POA: Diagnosis not present

## 2017-01-03 DIAGNOSIS — H01024 Squamous blepharitis left upper eyelid: Secondary | ICD-10-CM | POA: Diagnosis not present

## 2017-01-03 DIAGNOSIS — H01022 Squamous blepharitis right lower eyelid: Secondary | ICD-10-CM | POA: Diagnosis not present

## 2017-01-19 DIAGNOSIS — Z932 Ileostomy status: Secondary | ICD-10-CM | POA: Diagnosis not present

## 2017-01-19 DIAGNOSIS — K5732 Diverticulitis of large intestine without perforation or abscess without bleeding: Secondary | ICD-10-CM | POA: Diagnosis not present

## 2017-01-31 DIAGNOSIS — K9403 Colostomy malfunction: Secondary | ICD-10-CM | POA: Diagnosis not present

## 2017-01-31 DIAGNOSIS — K435 Parastomal hernia without obstruction or  gangrene: Secondary | ICD-10-CM | POA: Diagnosis not present

## 2017-01-31 DIAGNOSIS — R140 Abdominal distension (gaseous): Secondary | ICD-10-CM | POA: Diagnosis not present

## 2017-02-01 DIAGNOSIS — Q051 Thoracic spina bifida with hydrocephalus: Secondary | ICD-10-CM | POA: Diagnosis not present

## 2017-03-01 DIAGNOSIS — N319 Neuromuscular dysfunction of bladder, unspecified: Secondary | ICD-10-CM | POA: Diagnosis not present

## 2017-03-01 DIAGNOSIS — I10 Essential (primary) hypertension: Secondary | ICD-10-CM | POA: Diagnosis not present

## 2017-03-28 DIAGNOSIS — Z4889 Encounter for other specified surgical aftercare: Secondary | ICD-10-CM | POA: Diagnosis not present

## 2017-03-28 DIAGNOSIS — Z9049 Acquired absence of other specified parts of digestive tract: Secondary | ICD-10-CM | POA: Diagnosis not present

## 2017-06-02 DIAGNOSIS — Z79899 Other long term (current) drug therapy: Secondary | ICD-10-CM | POA: Diagnosis not present

## 2017-06-02 DIAGNOSIS — I10 Essential (primary) hypertension: Secondary | ICD-10-CM | POA: Diagnosis not present

## 2017-06-02 DIAGNOSIS — Z23 Encounter for immunization: Secondary | ICD-10-CM | POA: Diagnosis not present

## 2017-06-19 DIAGNOSIS — Z0000 Encounter for general adult medical examination without abnormal findings: Secondary | ICD-10-CM | POA: Diagnosis not present

## 2017-06-30 DIAGNOSIS — Z932 Ileostomy status: Secondary | ICD-10-CM | POA: Diagnosis not present

## 2017-06-30 DIAGNOSIS — K5732 Diverticulitis of large intestine without perforation or abscess without bleeding: Secondary | ICD-10-CM | POA: Diagnosis not present

## 2017-07-13 DIAGNOSIS — K5732 Diverticulitis of large intestine without perforation or abscess without bleeding: Secondary | ICD-10-CM | POA: Diagnosis not present

## 2017-07-13 DIAGNOSIS — Z932 Ileostomy status: Secondary | ICD-10-CM | POA: Diagnosis not present

## 2017-07-20 DIAGNOSIS — Z9889 Other specified postprocedural states: Secondary | ICD-10-CM | POA: Diagnosis not present

## 2017-07-20 DIAGNOSIS — Z888 Allergy status to other drugs, medicaments and biological substances status: Secondary | ICD-10-CM | POA: Diagnosis not present

## 2017-07-20 DIAGNOSIS — Z79899 Other long term (current) drug therapy: Secondary | ICD-10-CM | POA: Diagnosis not present

## 2017-07-20 DIAGNOSIS — Z982 Presence of cerebrospinal fluid drainage device: Secondary | ICD-10-CM | POA: Diagnosis not present

## 2017-07-20 DIAGNOSIS — Z881 Allergy status to other antibiotic agents status: Secondary | ICD-10-CM | POA: Diagnosis not present

## 2017-07-20 DIAGNOSIS — R6889 Other general symptoms and signs: Secondary | ICD-10-CM | POA: Diagnosis not present

## 2017-07-20 DIAGNOSIS — Z9049 Acquired absence of other specified parts of digestive tract: Secondary | ICD-10-CM | POA: Diagnosis not present

## 2017-07-20 DIAGNOSIS — R51 Headache: Secondary | ICD-10-CM | POA: Diagnosis not present

## 2017-07-20 DIAGNOSIS — I10 Essential (primary) hypertension: Secondary | ICD-10-CM | POA: Diagnosis not present

## 2017-08-09 DIAGNOSIS — Z9889 Other specified postprocedural states: Secondary | ICD-10-CM | POA: Diagnosis not present

## 2017-08-09 DIAGNOSIS — N319 Neuromuscular dysfunction of bladder, unspecified: Secondary | ICD-10-CM | POA: Diagnosis not present

## 2017-08-09 DIAGNOSIS — Q039 Congenital hydrocephalus, unspecified: Secondary | ICD-10-CM | POA: Diagnosis not present

## 2017-08-09 DIAGNOSIS — Q054 Unspecified spina bifida with hydrocephalus: Secondary | ICD-10-CM | POA: Diagnosis not present

## 2017-08-09 DIAGNOSIS — Z982 Presence of cerebrospinal fluid drainage device: Secondary | ICD-10-CM | POA: Diagnosis not present

## 2017-08-09 DIAGNOSIS — Z8669 Personal history of other diseases of the nervous system and sense organs: Secondary | ICD-10-CM | POA: Diagnosis not present

## 2017-08-09 DIAGNOSIS — Z932 Ileostomy status: Secondary | ICD-10-CM | POA: Diagnosis not present

## 2017-08-09 DIAGNOSIS — G8220 Paraplegia, unspecified: Secondary | ICD-10-CM | POA: Diagnosis not present

## 2017-09-18 DIAGNOSIS — N319 Neuromuscular dysfunction of bladder, unspecified: Secondary | ICD-10-CM | POA: Diagnosis not present

## 2017-11-08 DIAGNOSIS — B3742 Candidal balanitis: Secondary | ICD-10-CM | POA: Diagnosis not present

## 2017-11-08 DIAGNOSIS — N319 Neuromuscular dysfunction of bladder, unspecified: Secondary | ICD-10-CM | POA: Diagnosis not present

## 2017-11-08 DIAGNOSIS — N3020 Other chronic cystitis without hematuria: Secondary | ICD-10-CM | POA: Diagnosis not present

## 2017-11-16 DIAGNOSIS — N319 Neuromuscular dysfunction of bladder, unspecified: Secondary | ICD-10-CM | POA: Diagnosis not present

## 2017-11-16 DIAGNOSIS — G919 Hydrocephalus, unspecified: Secondary | ICD-10-CM | POA: Diagnosis not present

## 2017-11-16 DIAGNOSIS — E118 Type 2 diabetes mellitus with unspecified complications: Secondary | ICD-10-CM | POA: Diagnosis not present

## 2017-11-16 DIAGNOSIS — I10 Essential (primary) hypertension: Secondary | ICD-10-CM | POA: Diagnosis not present

## 2017-12-20 DIAGNOSIS — I10 Essential (primary) hypertension: Secondary | ICD-10-CM | POA: Diagnosis not present

## 2017-12-20 DIAGNOSIS — R7309 Other abnormal glucose: Secondary | ICD-10-CM | POA: Diagnosis not present

## 2017-12-27 DIAGNOSIS — N3020 Other chronic cystitis without hematuria: Secondary | ICD-10-CM | POA: Diagnosis not present

## 2017-12-27 DIAGNOSIS — N319 Neuromuscular dysfunction of bladder, unspecified: Secondary | ICD-10-CM | POA: Diagnosis not present

## 2017-12-27 DIAGNOSIS — R310 Gross hematuria: Secondary | ICD-10-CM | POA: Diagnosis not present

## 2018-01-03 DIAGNOSIS — H5203 Hypermetropia, bilateral: Secondary | ICD-10-CM | POA: Diagnosis not present

## 2018-01-05 DIAGNOSIS — Z932 Ileostomy status: Secondary | ICD-10-CM | POA: Diagnosis not present

## 2018-01-05 DIAGNOSIS — K5732 Diverticulitis of large intestine without perforation or abscess without bleeding: Secondary | ICD-10-CM | POA: Diagnosis not present

## 2018-01-10 DIAGNOSIS — Z932 Ileostomy status: Secondary | ICD-10-CM | POA: Diagnosis not present

## 2018-01-10 DIAGNOSIS — K5732 Diverticulitis of large intestine without perforation or abscess without bleeding: Secondary | ICD-10-CM | POA: Diagnosis not present

## 2018-02-26 DIAGNOSIS — Z932 Ileostomy status: Secondary | ICD-10-CM | POA: Diagnosis not present

## 2018-02-26 DIAGNOSIS — K5732 Diverticulitis of large intestine without perforation or abscess without bleeding: Secondary | ICD-10-CM | POA: Diagnosis not present

## 2018-03-10 DIAGNOSIS — K5732 Diverticulitis of large intestine without perforation or abscess without bleeding: Secondary | ICD-10-CM | POA: Diagnosis not present

## 2018-03-10 DIAGNOSIS — Z932 Ileostomy status: Secondary | ICD-10-CM | POA: Diagnosis not present

## 2018-03-16 DIAGNOSIS — I10 Essential (primary) hypertension: Secondary | ICD-10-CM | POA: Diagnosis not present

## 2018-03-16 DIAGNOSIS — Q054 Unspecified spina bifida with hydrocephalus: Secondary | ICD-10-CM | POA: Diagnosis not present

## 2018-03-16 DIAGNOSIS — E782 Mixed hyperlipidemia: Secondary | ICD-10-CM | POA: Diagnosis not present

## 2018-04-19 ENCOUNTER — Other Ambulatory Visit: Payer: Self-pay

## 2018-05-07 DIAGNOSIS — Z87440 Personal history of urinary (tract) infections: Secondary | ICD-10-CM | POA: Diagnosis not present

## 2018-05-07 DIAGNOSIS — K5732 Diverticulitis of large intestine without perforation or abscess without bleeding: Secondary | ICD-10-CM | POA: Diagnosis not present

## 2018-05-07 DIAGNOSIS — G8220 Paraplegia, unspecified: Secondary | ICD-10-CM | POA: Diagnosis not present

## 2018-05-07 DIAGNOSIS — Z932 Ileostomy status: Secondary | ICD-10-CM | POA: Diagnosis not present

## 2018-05-11 ENCOUNTER — Other Ambulatory Visit: Payer: Self-pay | Admitting: *Deleted

## 2018-05-11 NOTE — Patient Outreach (Addendum)
[facility]) Care Management  05/11/2018  [name] 1980-09-21 [id number]   Referral via [facility] prevent campaign-score 8;  Telephone call to patient; mother answered call & advised that she is caregiver and takes all of patient's call. HIPPA verification received.   Mother/caregiver states patient is disabled with hydrocephalus and spina bifida.   States patient has a CAP caseworker and has  aide assistance 4hrs daily from Monday to Friday. States services will be increased soon.  Voices that she manages patient's  medication but that he is able to take meds on his own. Voices no problems getting prescriptions filled.   Voices no problem with transportation to MD appointments. States they use bus for transportation. Voices he has primary provider, urologist & specialist in [place]. States she knows when & who to call if patient is having abnormal symptoms. Also knows when to call 911.  States having no healthcare concerns at this time. No Needs for [facility] services at this time.  Plan: Case closure.   Sherrin Daisy, RN BSN [place] Management Coordinator [facility] Care Management  [phone]

## 2018-06-15 DIAGNOSIS — Q052 Lumbar spina bifida with hydrocephalus: Secondary | ICD-10-CM | POA: Diagnosis not present

## 2018-06-15 DIAGNOSIS — I10 Essential (primary) hypertension: Secondary | ICD-10-CM | POA: Diagnosis not present

## 2018-06-15 DIAGNOSIS — E782 Mixed hyperlipidemia: Secondary | ICD-10-CM | POA: Diagnosis not present

## 2018-06-15 DIAGNOSIS — R7303 Prediabetes: Secondary | ICD-10-CM | POA: Diagnosis not present

## 2018-06-15 DIAGNOSIS — Z23 Encounter for immunization: Secondary | ICD-10-CM | POA: Diagnosis not present

## 2018-06-18 DIAGNOSIS — H5213 Myopia, bilateral: Secondary | ICD-10-CM | POA: Diagnosis not present

## 2018-07-27 DIAGNOSIS — K5732 Diverticulitis of large intestine without perforation or abscess without bleeding: Secondary | ICD-10-CM | POA: Diagnosis not present

## 2018-07-27 DIAGNOSIS — G8220 Paraplegia, unspecified: Secondary | ICD-10-CM | POA: Diagnosis not present

## 2018-07-27 DIAGNOSIS — Z932 Ileostomy status: Secondary | ICD-10-CM | POA: Diagnosis not present

## 2018-08-05 DIAGNOSIS — K5732 Diverticulitis of large intestine without perforation or abscess without bleeding: Secondary | ICD-10-CM | POA: Diagnosis not present

## 2018-08-05 DIAGNOSIS — G8220 Paraplegia, unspecified: Secondary | ICD-10-CM | POA: Diagnosis not present

## 2018-08-05 DIAGNOSIS — Z932 Ileostomy status: Secondary | ICD-10-CM | POA: Diagnosis not present

## 2018-08-08 DIAGNOSIS — N319 Neuromuscular dysfunction of bladder, unspecified: Secondary | ICD-10-CM | POA: Diagnosis not present

## 2018-08-08 DIAGNOSIS — N3020 Other chronic cystitis without hematuria: Secondary | ICD-10-CM | POA: Diagnosis not present

## 2018-09-18 DIAGNOSIS — Z932 Ileostomy status: Secondary | ICD-10-CM | POA: Diagnosis not present

## 2018-09-18 DIAGNOSIS — I10 Essential (primary) hypertension: Secondary | ICD-10-CM | POA: Diagnosis not present

## 2018-09-18 DIAGNOSIS — N319 Neuromuscular dysfunction of bladder, unspecified: Secondary | ICD-10-CM | POA: Diagnosis not present

## 2018-09-18 DIAGNOSIS — K5732 Diverticulitis of large intestine without perforation or abscess without bleeding: Secondary | ICD-10-CM | POA: Diagnosis not present

## 2018-09-18 DIAGNOSIS — E782 Mixed hyperlipidemia: Secondary | ICD-10-CM | POA: Diagnosis not present

## 2018-09-19 DIAGNOSIS — B964 Proteus (mirabilis) (morganii) as the cause of diseases classified elsewhere: Secondary | ICD-10-CM | POA: Diagnosis not present

## 2018-09-19 DIAGNOSIS — N390 Urinary tract infection, site not specified: Secondary | ICD-10-CM | POA: Diagnosis not present

## 2018-10-11 DIAGNOSIS — R3914 Feeling of incomplete bladder emptying: Secondary | ICD-10-CM | POA: Diagnosis not present

## 2018-10-11 DIAGNOSIS — N390 Urinary tract infection, site not specified: Secondary | ICD-10-CM | POA: Diagnosis not present

## 2018-10-18 DIAGNOSIS — N319 Neuromuscular dysfunction of bladder, unspecified: Secondary | ICD-10-CM | POA: Diagnosis not present

## 2018-10-22 ENCOUNTER — Other Ambulatory Visit: Payer: Self-pay | Admitting: Urology

## 2018-10-23 NOTE — Addendum Note (Signed)
Addended by: Link Snuffer D III on: 10/23/2018 09:59 PM   Modules accepted: Orders

## 2018-10-25 ENCOUNTER — Encounter (HOSPITAL_COMMUNITY): Payer: Self-pay | Admitting: *Deleted

## 2018-10-26 DIAGNOSIS — N319 Neuromuscular dysfunction of bladder, unspecified: Secondary | ICD-10-CM | POA: Diagnosis not present

## 2018-11-05 DIAGNOSIS — K5732 Diverticulitis of large intestine without perforation or abscess without bleeding: Secondary | ICD-10-CM | POA: Diagnosis not present

## 2018-11-05 DIAGNOSIS — G8220 Paraplegia, unspecified: Secondary | ICD-10-CM | POA: Diagnosis not present

## 2018-11-05 DIAGNOSIS — Z932 Ileostomy status: Secondary | ICD-10-CM | POA: Diagnosis not present

## 2018-11-07 NOTE — Patient Instructions (Addendum)
[name]  11/07/2018      Your procedure is scheduled on: Wednesday 11/14/2018   Report to [facility] Main  Entrance,  Report to admitting at   7:45  AM    Call this number if you have problems the morning of surgery [phone]       Remember: Do not eat food or drink liquids :After Midnight.               BRUSH YOUR TEETH MORNING OF SURGERY AND RINSE YOUR MOUTH OUT, NO CHEWING GUM CANDY OR MINTS.      Take these medicines the morning of surgery with A SIP OF WATER:  Metoprolol, Stool softner,  Rosuvastatin                                 You may not have any metal on your body including hair pins and              piercings  Do not wear jewelry, make-up, lotions, powders or perfumes, deodorant                         Men may shave face and neck.   Do not bring valuables to the hospital. [place].  Contacts, dentures or bridgework may not be worn into surgery.  Leave suitcase in the car. After surgery it may be brought to your room.     Patients discharged the day of surgery will not be allowed to drive home. IF YOU ARE HAVING SURGERY AND GOING HOME THE SAME DAY, YOU MUST HAVE AN ADULT TO DRIVE YOU HOME AND BE WITH YOU FOR 24 HOURS. YOU MAY GO HOME BY TAXI OR UBER OR ORTHERWISE, BUT AN ADULT MUST ACCOMPANY YOU HOME AND STAY WITH YOU FOR 24 HOURS.    Name and phone number of your driver:  Mother--  Crisoforo Oxford  [phone]                Please read over the following fact sheets you were given: _____________________________________________________________________             [facility] - Preparing for Surgery Before surgery, you can play an important role.  Because skin is not sterile, your skin needs to be as free of germs as possible.  You can reduce the number of germs on your skin by washing with CHG (chlorahexidine gluconate) soap before surgery.  CHG is an antiseptic cleaner  which kills germs and bonds with the skin to continue killing germs even after washing. Please DO NOT use if you have an allergy to CHG or antibacterial soaps.  If your skin becomes reddened/irritated stop using the CHG and inform your nurse when you arrive at Short Stay. Do not shave (including legs and underarms) for at least 48 hours prior to the first CHG shower.  You may shave your face/neck. Please follow these instructions carefully:  1.  Shower with CHG Soap the night before surgery and the  morning of Surgery.  2.  If you choose to wash your hair, wash your hair first as usual with your  normal  shampoo.  3.  After you shampoo, rinse your hair and  body thoroughly to remove the  shampoo.                            4.  Use CHG as you would any other liquid soap.  You can apply chg directly  to the skin and wash                       Gently with a scrungie or clean washcloth.  5.  Apply the CHG Soap to your body ONLY FROM THE NECK DOWN.   Do not use on face/ open                           Wound or open sores. Avoid contact with eyes, ears mouth and genitals (private parts).                       Wash face,  Genitals (private parts) with your normal soap.             6.  Wash thoroughly, paying special attention to the area where your surgery  will be performed.  7.  Thoroughly rinse your body with warm water from the neck down.  8.  DO NOT shower/wash with your normal soap after using and rinsing off  the CHG Soap.             9.  Pat yourself dry with a clean towel.            10.  Wear clean pajamas.            11.  Place clean sheets on your bed the night of your first shower and do not  sleep with pets. Day of Surgery : Do not apply any lotions/deodorants the morning of surgery.  Please wear clean clothes to the hospital/surgery center.  FAILURE TO FOLLOW THESE INSTRUCTIONS MAY RESULT IN THE CANCELLATION OF YOUR SURGERY PATIENT SIGNATURE_________________________________  NURSE  SIGNATURE__________________________________  ________________________________________________________________________

## 2018-11-12 ENCOUNTER — Encounter (HOSPITAL_COMMUNITY)
Admission: RE | Admit: 2018-11-12 | Discharge: 2018-11-12 | Disposition: A | Discharge status: Home / Self Care | Payer: Medicare Other | Source: Ambulatory Visit | Attending: Urology | Admitting: Urology

## 2018-11-12 ENCOUNTER — Encounter (HOSPITAL_COMMUNITY): Payer: Self-pay

## 2018-11-12 ENCOUNTER — Other Ambulatory Visit: Payer: Self-pay

## 2018-11-12 DIAGNOSIS — N319 Neuromuscular dysfunction of bladder, unspecified: Secondary | ICD-10-CM | POA: Diagnosis not present

## 2018-11-12 DIAGNOSIS — Z933 Colostomy status: Secondary | ICD-10-CM | POA: Diagnosis not present

## 2018-11-12 DIAGNOSIS — N312 Flaccid neuropathic bladder, not elsewhere classified: Secondary | ICD-10-CM | POA: Insufficient documentation

## 2018-11-12 DIAGNOSIS — Q059 Spina bifida, unspecified: Secondary | ICD-10-CM | POA: Diagnosis not present

## 2018-11-12 DIAGNOSIS — Z885 Allergy status to narcotic agent status: Secondary | ICD-10-CM | POA: Diagnosis not present

## 2018-11-12 DIAGNOSIS — Z79899 Other long term (current) drug therapy: Secondary | ICD-10-CM | POA: Diagnosis not present

## 2018-11-12 DIAGNOSIS — Z01818 Encounter for other preprocedural examination: Secondary | ICD-10-CM

## 2018-11-12 DIAGNOSIS — Z881 Allergy status to other antibiotic agents status: Secondary | ICD-10-CM | POA: Diagnosis not present

## 2018-11-12 DIAGNOSIS — Z9119 Patient's noncompliance with other medical treatment and regimen: Secondary | ICD-10-CM | POA: Diagnosis not present

## 2018-11-12 DIAGNOSIS — Z8249 Family history of ischemic heart disease and other diseases of the circulatory system: Secondary | ICD-10-CM | POA: Diagnosis not present

## 2018-11-12 DIAGNOSIS — Z87440 Personal history of urinary (tract) infections: Secondary | ICD-10-CM | POA: Diagnosis not present

## 2018-11-12 DIAGNOSIS — Z888 Allergy status to other drugs, medicaments and biological substances status: Secondary | ICD-10-CM | POA: Diagnosis not present

## 2018-11-12 DIAGNOSIS — Z91040 Latex allergy status: Secondary | ICD-10-CM | POA: Diagnosis not present

## 2018-11-12 HISTORY — DX: Migraine, unspecified, not intractable, without status migrainosus: G43.909

## 2018-11-12 HISTORY — DX: Presence of cerebrospinal fluid drainage device: Z98.2

## 2018-11-12 HISTORY — DX: Prediabetes: R73.03

## 2018-11-12 HISTORY — DX: Personal history of other specified conditions: Z87.898

## 2018-11-12 LAB — BASIC METABOLIC PANEL
Anion gap: 7 (ref 5–15)
BUN: 20 mg/dL (ref 6–20)
CO2: 24 mmol/L (ref 22–32)
Calcium: 8.8 mg/dL — ABNORMAL LOW (ref 8.9–10.3)
Chloride: 110 mmol/L (ref 98–111)
Creatinine, Ser: 0.48 mg/dL — ABNORMAL LOW (ref 0.61–1.24)
GFR calc Af Amer: >60 mL/min (ref >60)
GFR calc non Af Amer: >60 mL/min (ref >60)
Glucose, Bld: 125 mg/dL — ABNORMAL HIGH (ref 70–99)
Potassium: 3.8 mmol/L (ref 3.5–5.1)
SODIUM: 141 mmol/L (ref 135–145)

## 2018-11-12 LAB — CBC
HCT: 45.7 % (ref 39.0–52.0)
Hemoglobin: 13.7 g/dL (ref 13.0–17.0)
MCH: 25 pg — ABNORMAL LOW (ref 26.0–34.0)
MCHC: 30 g/dL (ref 30.0–36.0)
MCV: 83.4 fL (ref 80.0–100.0)
Platelets: 175 10*3/uL (ref 150–400)
RBC: 5.48 MIL/uL (ref 4.22–5.81)
RDW: 14.9 % (ref 11.5–15.5)
WBC: 6.9 10*3/uL (ref 4.0–10.5)
nRBC: 0 % (ref 0.0–0.2)

## 2018-11-12 LAB — HEMOGLOBIN A1C
Hgb A1c MFr Bld: 5.2 % (ref 4.8–5.6)
Mean Plasma Glucose: 102.54 mg/dL

## 2018-11-12 NOTE — Progress Notes (Signed)
Final EKG dated 11-12-2018 in epic.  Chart to anesthesia for review, Konrad Felix PA.

## 2018-11-13 NOTE — Anesthesia Preprocedure Evaluation (Addendum)
Anesthesia Evaluation  Patient identified by MRN, date of birth, ID band Patient awake    Reviewed: Allergy & Precautions, H&P , NPO status , Patient's Chart, lab work & pertinent test results, reviewed documented beta blocker date and time   Airway Mallampati: II  TM Distance: >3 FB Neck ROM: full    Dental no notable dental hx. (+) Poor Dentition   Pulmonary neg pulmonary ROS,    Pulmonary exam normal breath sounds clear to auscultation       Cardiovascular Exercise Tolerance: Good hypertension, Pt. on medications and Pt. 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  Arnold-Chiari malformation    Neurogenic bladder   INCONTINENT -  Congenital paraplegia (HCC)     negative psych ROS   GI/Hepatic Neg liver ROS, GERD  Medicated and Controlled,  Endo/Other  negative endocrine ROS  Renal/GU Renal disease  negative genitourinary   Musculoskeletal   Abdominal   Peds  Hematology  (+) Blood dyscrasia, anemia ,   Anesthesia Other Findings   Reproductive/Obstetrics negative OB ROS                                                             Anesthesia Evaluation  Patient identified by MRN, date of birth, ID band Patient awake    Reviewed: Allergy & Precautions, H&P , NPO status , Patient's Chart, lab work & pertinent test results  Airway Mallampati: III TM Distance: >3 FB Neck ROM: Limited    Dental  (+) Teeth Intact, Poor Dentition, Dental Advisory Given   Pulmonary neg pulmonary ROS,  breath sounds clear to auscultation  + decreased breath sounds      Cardiovascular hypertension, Pt. 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 negative psych ROS   GI/Hepatic negative GI ROS, Neg liver ROS, GERD-  ,  Endo/Other  negative endocrine ROS  Renal/GU negative Renal ROS  negative genitourinary   Musculoskeletal negative musculoskeletal ROS (+)   Abdominal   Peds  Hematology negative hematology ROS (+) anemia ,   Anesthesia Other Findings Diminished inspiratory effort secondary to RUQ pain  Reproductive/Obstetrics                           Anesthesia Physical Anesthesia Plan  ASA: III  Anesthesia Plan: General   Post-op Pain Management:    Induction: Intravenous  Airway Management Planned: LMA  Additional Equipment:   Intra-op Plan:   Post-operative Plan: Extubation in OR  Informed Consent: I have reviewed the patients History and Physical, chart, labs and discussed the procedure including the risks, benefits and alternatives for the proposed anesthesia with the patient or authorized representative who has indicated his/her understanding and acceptance.   Dental advisory given  Plan Discussed with: CRNA  Anesthesia Plan Comments:         Anesthesia Quick Evaluation  Anesthesia Evaluation  Patient identified by MRN, date of birth, ID band Patient awake    Reviewed: Allergy & Precautions, H&P , NPO status , Patient's Chart, lab work & pertinent test results  Airway Mallampati: II TM Distance: >3 FB Neck ROM: Full   Comment: Intubated earlier today. Dental no notable dental hx.    Pulmonary neg pulmonary ROS,  breath sounds clear to auscultation  Pulmonary exam normal       Cardiovascular hypertension, Pt. on medications Rhythm:Regular Rate:Normal     Neuro/Psych negative neurological ROS  negative psych ROS   GI/Hepatic Neg liver ROS, GERD-  Medicated,  Endo/Other  negative endocrine ROS  Renal/GU negative Renal ROS   negative genitourinary   Musculoskeletal negative musculoskeletal ROS (+)   Abdominal (+) + obese,   Peds negative pediatric ROS (+)  Hematology negative hematology ROS (+)   Anesthesia Other Findings   Reproductive/Obstetrics negative OB ROS                           Anesthesia Physical Anesthesia Plan  ASA: IV and emergent  Anesthesia Plan: General   Post-op Pain Management:    Induction: Intravenous  Airway Management Planned: Oral ETT  Additional Equipment:   Intra-op Plan:   Post-operative Plan: Post-operative intubation/ventilation  Informed Consent: I have reviewed the patients History and Physical, chart, labs and discussed the procedure including the risks, benefits and alternatives for the proposed anesthesia with the patient or authorized representative who has indicated his/her understanding and acceptance.     Plan Discussed with: CRNA  Anesthesia Plan Comments: (Intubated in ICU. Low blood pressure. CVC in place. Na 125. Hgb 10.1. Elevated LFTs. Levophed at 35 mcg/min. Vent. Settings: TV 450, FiO2 0.7 PEEP 5 RR 16. Last blood pressure 83/48. HR 125. O2Sat 100%.  Discussed critical condition with patient's mother.  Will attempt arterial line placement.)      Anesthesia Quick Evaluation   Anesthesia Physical Anesthesia Plan  ASA: III  Anesthesia Plan: MAC   Post-op Pain Management:    Induction: Intravenous  PONV Risk Score and Plan: 2 and Ondansetron, Treatment may vary due to age or medical condition and Midazolam  Airway Management Planned: LMA, Nasal Cannula, Simple Face Mask and Mask  Additional Equipment:   Intra-op Plan:   Post-operative Plan:   Informed Consent: I have reviewed the patients History and Physical, chart, labs and discussed the procedure including the risks, benefits and alternatives for the proposed anesthesia with the patient or authorized representative who has indicated his/her  understanding and acceptance.     Dental Advisory Given  Plan Discussed with: CRNA, Anesthesiologist and Surgeon  Anesthesia Plan Comments: (See PAT note 11/12/18, Konrad Felix, PA-C)      Anesthesia Quick Evaluation

## 2018-11-13 NOTE — Progress Notes (Signed)
Anesthesia Chart Review   Case:  [id number] Date/Time:  11/14/18 0945   Procedure:  BOTOX INJECTION CYSTOSCOPY (N/A )   Anesthesia type:  General   Pre-op diagnosis:  neurogenic bladder   Location:  [facility] / [facility]   Surgeon:  Lucas Mallow, MD      DISCUSSION: 38 yo never smoker with h/o HTN, spina bifida with hydrocephalus (VP shunt in place, paralyzed mid abdomen down; wheel chair bound), ileostomy in place, migraines, GERD, neurogenic bladder scheduled for above procedure 11/14/18 with Dr. Link Snuffer.   Last seen by neurosurgery 08/14/17 s/p VP shunt revision 2016.  Pt stable at this visit with 2 year follow up recommended.    Pt can proceed with planned procedure barring acute status change.  VS: BP 130/76   Pulse 71   Temp 36.7 C (Oral)   Resp 20   SpO2 100%   PROVIDERS: Lucianne Lei, MD is PCP   Monika Salk, MD is Neurosurgeon LABS: Labs reviewed: Acceptable for surgery. (all labs ordered are listed, but only abnormal results are displayed)  Labs Reviewed  BASIC METABOLIC PANEL - Abnormal; Notable for the following components:      Result Value   Glucose, Bld 125 (*)    Creatinine, Ser 0.48 (*)    Calcium 8.8 (*)    All other components within normal limits  CBC - Abnormal; Notable for the following components:   MCH 25.0 (*)    All other components within normal limits  HEMOGLOBIN A1C     IMAGES:   EKG: 11/12/2018 Rate 72 bpm Normal Sinus rhythm  Normal ECG  CV:  Past Medical History:  Diagnosis Date  . Arnold-Chiari malformation ([place])   . 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 GERD (gastroesophageal reflux disease)   . Heart palpitations    SECONDARY TO VP SHUNT DRAIN  . History of seizure    per pt mother, no seizures since age 71  . Hypertension   . Ileostomy in place [facility]) 2014  . Migraines followed by neurologist @[facility]    taking verapamil  . Neurogenic bladder    INCONTINENT -  . Neurogenic  bowel   . Pre-diabetes   . S/P VP shunt   . 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 Urinary incontinence with continuous leakage   . Wears glasses   . Wheelchair bound    CAN TRASFER SELF WITHOUT BOARD    Past Surgical History:  Procedure Laterality Date  . BACK SURGERY  infant   "get skin to stretch over his back; spinal bifida"  . BACK SURGERY  age 35   scoliosis and  removed hump on his back (kyphosis) and put rods in to stabalize back"  . CHOLECYSTECTOMY N/A 07/06/2013   Procedure: LAPAROSCOPIC CHOLECYSTECTOMY WITH INTRAOPERATIVE CHOLANGIOGRAM;  Surgeon: Madilyn Hook, DO;  Location: [facility];  Service: General;  Laterality: N/A;  . COLON RESECTION N/A 07/08/2013   Procedure: EXPLORATORY LAPAROSCOPY, DIAGNOSTIC LAPAROSCOPY, PARTIAL COLECTOMY, ABDOMINAL WASHOUT, ABDOMINAL WOUND VAC;  Surgeon: Madilyn Hook, DO;  Location: [facility];  Service: General;  Laterality: N/A;  . HIP SURGERY Bilateral AS CHILD   TENDON RELEASE  . INCISION AND DRAINAGE OF WOUND N/A 11/04/2013   Procedure: IRRIGATION AND DEBRIDEMENT ABDOMINAL WOUND WITH PLACEMENT OF ACELL/VAC;  Surgeon: Theodoro Kos, DO;  Location: [place];  Service: Clinical cytogeneticist;  Laterality: N/A;  . 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 RIGHT COLECTOMY/  APPENDECTOMY/  ILEOSTOMY  NOV 2014  [facility]   surgical complication with cholecystectomy  . UMBILICAL HERNIA REPAIR  child  . VENTRICULOPERITONEAL SHUNT  11-12-2018 per pt and mother---LAST REVISION ~ 06/2015   DUE TO ABD WOUND--- SHUNT DRAINS TO HEART (12/23/2015)    MEDICATIONS: . acetaminophen (TYLENOL) 650 MG CR tablet  . calcium-vitamin D (OSCAL 500/200 D-3) 500-200 MG-UNIT tablet  . Cranberry-Vitamin C-Vitamin E (CRANBERRY PLUS VITAMIN C) 4200-20-3 MG-MG-UNIT CAPS  . docusate sodium (COLACE) 100 MG capsule  . KLOR-CON M20 20 MEQ tablet  . metoprolol tartrate (LOPRESSOR)  50 MG tablet  . Multiple Vitamin (MULTIVITAMIN) tablet  . ondansetron (ZOFRAN) 4 MG tablet  . rosuvastatin (CRESTOR) 20 MG tablet  . verapamil (VERELAN PM) 120 MG 24 hr capsule   No current facility-administered medications for this encounter.      Maia Plan [facility] Pre-Surgical Testing [phone] 11/13/18 11:22 AM

## 2018-11-14 ENCOUNTER — Ambulatory Visit (HOSPITAL_COMMUNITY): Payer: Medicare Other | Admitting: Anesthesiology

## 2018-11-14 ENCOUNTER — Encounter (HOSPITAL_COMMUNITY)
Admission: RE | Disposition: A | Discharge status: Home / Self Care | Payer: Self-pay | Source: Home / Self Care | Attending: Urology

## 2018-11-14 ENCOUNTER — Encounter (HOSPITAL_COMMUNITY): Payer: Self-pay

## 2018-11-14 ENCOUNTER — Ambulatory Visit (HOSPITAL_COMMUNITY)
Admission: RE | Admit: 2018-11-14 | Discharge: 2018-11-14 | Disposition: A | Discharge status: Home / Self Care | Payer: Medicare Other | Attending: Urology | Admitting: Urology

## 2018-11-14 ENCOUNTER — Ambulatory Visit (HOSPITAL_COMMUNITY): Payer: Medicare Other | Admitting: Physician Assistant

## 2018-11-14 ENCOUNTER — Other Ambulatory Visit: Payer: Self-pay

## 2018-11-14 DIAGNOSIS — Z885 Allergy status to narcotic agent status: Secondary | ICD-10-CM | POA: Insufficient documentation

## 2018-11-14 DIAGNOSIS — Z881 Allergy status to other antibiotic agents status: Secondary | ICD-10-CM | POA: Insufficient documentation

## 2018-11-14 DIAGNOSIS — Z9119 Patient's noncompliance with other medical treatment and regimen: Secondary | ICD-10-CM | POA: Insufficient documentation

## 2018-11-14 DIAGNOSIS — Z91040 Latex allergy status: Secondary | ICD-10-CM | POA: Insufficient documentation

## 2018-11-14 DIAGNOSIS — Z933 Colostomy status: Secondary | ICD-10-CM | POA: Insufficient documentation

## 2018-11-14 DIAGNOSIS — Z79899 Other long term (current) drug therapy: Secondary | ICD-10-CM | POA: Insufficient documentation

## 2018-11-14 DIAGNOSIS — Z87440 Personal history of urinary (tract) infections: Secondary | ICD-10-CM | POA: Insufficient documentation

## 2018-11-14 DIAGNOSIS — N319 Neuromuscular dysfunction of bladder, unspecified: Secondary | ICD-10-CM | POA: Diagnosis not present

## 2018-11-14 DIAGNOSIS — Q059 Spina bifida, unspecified: Secondary | ICD-10-CM | POA: Insufficient documentation

## 2018-11-14 DIAGNOSIS — Z8249 Family history of ischemic heart disease and other diseases of the circulatory system: Secondary | ICD-10-CM | POA: Insufficient documentation

## 2018-11-14 DIAGNOSIS — Z888 Allergy status to other drugs, medicaments and biological substances status: Secondary | ICD-10-CM | POA: Insufficient documentation

## 2018-11-14 HISTORY — PX: BOTOX INJECTION: SHX5754

## 2018-11-14 LAB — GLUCOSE, CAPILLARY: Glucose-Capillary: 71 mg/dL (ref 70–99)

## 2018-11-14 SURGERY — BOTOX INJECTION
Anesthesia: Monitor Anesthesia Care

## 2018-11-14 MED ORDER — CEFAZOLIN SODIUM-DEXTROSE 2-4 GM/100ML-% IV SOLN
2.0000 g | INTRAVENOUS | Status: DC
  Filled 2018-11-14: qty 100

## 2018-11-14 MED ORDER — SODIUM CHLORIDE 0.9 % IR SOLN
Status: DC | PRN
  Administered 2018-11-14: 3000 mL via INTRAVESICAL

## 2018-11-14 MED ORDER — MIDAZOLAM HCL 5 MG/5ML IJ SOLN
INTRAMUSCULAR | Status: DC | PRN
  Administered 2018-11-14: 1 mg via INTRAVENOUS

## 2018-11-14 MED ORDER — CEFAZOLIN SODIUM-DEXTROSE 2-3 GM-%(50ML) IV SOLR
INTRAVENOUS | Status: DC | PRN
  Administered 2018-11-14: 2 g via INTRAVENOUS

## 2018-11-14 MED ORDER — SODIUM CHLORIDE (PF) 0.9 % IJ SOLN
INTRAMUSCULAR | Status: DC | PRN
  Administered 2018-11-14: 20 mL

## 2018-11-14 MED ORDER — ACETAMINOPHEN 160 MG/5ML PO SOLN: 325.0000 mg | ORAL | Status: DC | PRN

## 2018-11-14 MED ORDER — MEPERIDINE HCL 50 MG/ML IJ SOLN: 6.2500 mg | INTRAMUSCULAR | Status: DC | PRN

## 2018-11-14 MED ORDER — MIDAZOLAM HCL 2 MG/2ML IJ SOLN
INTRAMUSCULAR | Status: AC
  Filled 2018-11-14: qty 2

## 2018-11-14 MED ORDER — ONDANSETRON HCL 4 MG/2ML IJ SOLN: 4.0000 mg | Freq: Once | INTRAMUSCULAR | Status: DC | PRN

## 2018-11-14 MED ORDER — OXYCODONE HCL 5 MG PO TABS: 5.0000 mg | ORAL_TABLET | Freq: Once | ORAL | Status: DC | PRN

## 2018-11-14 MED ORDER — FENTANYL CITRATE (PF) 100 MCG/2ML IJ SOLN: 25.0000 ug | INTRAMUSCULAR | Status: DC | PRN

## 2018-11-14 MED ORDER — PROPOFOL 10 MG/ML IV BOLUS
INTRAVENOUS | Status: AC
  Filled 2018-11-14: qty 20

## 2018-11-14 MED ORDER — ONABOTULINUMTOXINA 100 UNITS IJ SOLR
100.0000 [IU] | Freq: Once | INTRAMUSCULAR | Status: AC
  Administered 2018-11-14: 100 [IU] via INTRAMUSCULAR
  Filled 2018-11-14: qty 100

## 2018-11-14 MED ORDER — SODIUM CHLORIDE (PF) 0.9 % IJ SOLN
INTRAMUSCULAR | Status: AC
  Filled 2018-11-14: qty 20

## 2018-11-14 MED ORDER — FENTANYL CITRATE (PF) 100 MCG/2ML IJ SOLN
INTRAMUSCULAR | Status: AC
  Filled 2018-11-14: qty 2

## 2018-11-14 MED ORDER — ONDANSETRON HCL 4 MG/2ML IJ SOLN
INTRAMUSCULAR | Status: DC | PRN
  Administered 2018-11-14: 4 mg via INTRAVENOUS

## 2018-11-14 MED ORDER — OXYCODONE HCL 5 MG/5ML PO SOLN: 5.0000 mg | Freq: Once | ORAL | Status: DC | PRN

## 2018-11-14 MED ORDER — LACTATED RINGERS IV SOLN
INTRAVENOUS | Status: DC
  Administered 2018-11-14: 09:00:00 via INTRAVENOUS

## 2018-11-14 MED ORDER — ACETAMINOPHEN 325 MG PO TABS: 325.0000 mg | ORAL_TABLET | ORAL | Status: DC | PRN

## 2018-11-14 SURGICAL SUPPLY — 15 items
BAG URO CATCHER STRL LF (MISCELLANEOUS) ×3 IMPLANT
CATH SILICONE 5CC 18FR (INSTRUMENTS) ×2 IMPLANT
CLOTH BEACON ORANGE TIMEOUT ST (SAFETY) ×3 IMPLANT
COVER WAND RF STERILE (DRAPES) IMPLANT
GLOVE BIO SURGEON STRL SZ7.5 (GLOVE) ×7 IMPLANT
GOWN STRL REUS W/TWL LRG LVL3 (GOWN DISPOSABLE) ×6 IMPLANT
GUIDEWIRE STR DUAL SENSOR (WIRE) ×2 IMPLANT
MANIFOLD NEPTUNE II (INSTRUMENTS) ×3 IMPLANT
NDL SAFETY ECLIPSE 18X1.5 (NEEDLE) IMPLANT
NEEDLE HYPO 18GX1.5 SHARP (NEEDLE) ×3
PACK CYSTO (CUSTOM PROCEDURE TRAY) ×3 IMPLANT
SYR CONTROL 10ML LL (SYRINGE) ×2 IMPLANT
TUBING CONNECTING 10 (TUBING) IMPLANT
TUBING CONNECTING 10' (TUBING)
WATER STERILE IRR 3000ML UROMA (IV SOLUTION) ×3 IMPLANT

## 2018-11-14 NOTE — Transfer of Care (Signed)
Immediate Anesthesia Transfer of Care Note  Patient: [name]  Procedure(s) Performed: Procedure(s): BOTOX INJECTION CYSTOSCOPY (N/A)  Patient Location: PACU  Anesthesia Type:MAC  Level of Consciousness:  sedated, patient cooperative and responds to stimulation  Airway & Oxygen Therapy:Patient Spontanous Breathing and Patient connected to face mask oxgen  Post-op Assessment:  Report given to PACU RN and Post -op Vital signs reviewed and stable  Post vital signs:  Reviewed and stable  Last Vitals:  Vitals:   11/14/18 0759 11/14/18 1042  BP: 126/75 110/60  Pulse: 76   Resp: 20 18  Temp: 36.6 C   SpO2: 03%     Complications: No apparent anesthesia complications

## 2018-11-14 NOTE — Anesthesia Postprocedure Evaluation (Signed)
Anesthesia Post Note  Patient: [name]  Procedure(s) Performed: BOTOX INJECTION CYSTOSCOPY (N/A )     Patient location during evaluation: PACU Anesthesia Type: MAC Level of consciousness: awake and alert Pain management: pain level controlled Vital Signs Assessment: post-procedure vital signs reviewed and stable Respiratory status: spontaneous breathing, nonlabored ventilation, respiratory function stable and patient connected to nasal cannula oxygen Cardiovascular status: stable and blood pressure returned to baseline Postop Assessment: no apparent nausea or vomiting Anesthetic complications: no    Last Vitals:  Vitals:   11/14/18 1115 11/14/18 1121  BP: 112/60 125/69  Pulse: 76 80  Resp: 18   Temp: 36.8 C 36.5 C  SpO2: 100% 100%    Last Pain:  Vitals:   11/14/18 1121  TempSrc: Oral  PainSc: 0-No pain                 Isela Stantz

## 2018-11-14 NOTE — H&P (Signed)
CC/HPI: CC: Neurogenic bladder, history of spina bifida  HPI:  38 year old male previously followed by Dr. Risa Grill. He was originally referred for recurrent urinary tract infections in the setting of a history of spina bifida. He has been followed for a few years here. I do not see a recent urodynamics. He has a colostomy. His mother accompanies him. She denies any history of bladder surgery such as bladder augmentation or vesicostomy. When he was younger, he performed clean intermittent catheterization but has not done this for quite some time. His mother states he just dribbles into his diaper.   10/18/2018  Patient underwent a urodynamics. This revealed a very hostile bladder. There was a loss of compliance that was quite significant. He had unstable neurogenic detrusor overactivity. He had a high pressure system.he is highly against having a catheter or suprapubic tube placed. His providers with him. She states she is able to catheterize him 4-6 times daily. He has good hand function but he does not think that he can catheterize himself.     ALLERGIES: Cipro TABS Contrast Media Ready-Box MISC Latex Gloves MISC Morphine Derivatives Vancomycin HCl SOLR    MEDICATIONS: Calcium + D  Cranberry Fruit  Klor-Con 20 meq packet  Metoprolol Tartrate 50 mg tablet Oral  Multi-Day Vitamins tablet Oral  Rosuvastatin Calcium 20 mg tablet  Tylenol Arthritis PRN  Verapamil Hcl 1 PO Daily  Zofran 4 mg tablet Oral     GU PSH: Catheterization For Collection Of Specimen, Single Patient, All Places Of Service - 08/08/2018, 12/27/2017 Catheterize For Residual - 08/08/2018, 2017 Complex cystometrogram, w/ void pressure and urethral pressure profile studies, any technique - 10/11/2018 Complex Uroflow - 10/11/2018 Emg surf Electrd - 10/11/2018 Inject For cystogram - 10/11/2018 Intrabd voidng Press - 10/11/2018      PSH Notes: Ileostomy, Colostomy, Cholecystectomy, Appendectomy   NON-GU PSH: Appendectomy -  2016 Cholecystectomy (open) - 2016 Colostomy - 2016    GU PMH: Incomplete bladder emptying - 10/11/2018 Incontinence w/o Sensation - 09/19/2018 Candidal balanitis - 11/08/2017 Oth urogenital candidiasis - 11/08/2017 Obstructive and reflux uropathy, 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/2017 Bladder, Neuromuscular dysfunction, Unspec, Neurogenic bladder - 2016 Chronic cystitis (w/o hematuria), Chronic cystitis - 2016 Urge incontinence, Urge incontinence of urine - 2016    NON-GU PMH: Spina bifida, unspecified - 09/19/2018 Encounter for general adult medical examination without abnormal findings, Encounter for preventive health examination - 2016 Personal history of other diseases of the circulatory system, History of hypertension - 2016 Personal history of other diseases of the nervous system and sense organs, History of hydrocephalus - 2016 Personal history of other specified (corrected) congenital malformations of nervous system and sense organs, History of spina bifida - 2016 Personal history of other specified conditions, History of heartburn - 2016    FAMILY HISTORY: Diabetes - Runs In Family Hypertension - Runs In Family Tuberculosis - Runs In Family   SOCIAL HISTORY: Marital Status: Single Preferred Language: English; Ethnicity: Not Hispanic Or Latino; Race: White Current Smoking Status: Patient has never smoked.  Drinks 1 caffeinated drink per day.     Notes: Caffeine use, Never smoker, Alcohol use, Disabled, Single, Number of children, Never used tobacco   REVIEW OF SYSTEMS:    GU Review Male:   Patient denies frequent urination, hard to postpone urination, burning/ pain with urination, get up at night to urinate, leakage of urine, stream starts and stops, trouble starting your stream, have to strain to urinate , erection problems, and penile pain.  Gastrointestinal (Upper):  Patient denies nausea, vomiting, and indigestion/ heartburn.  Gastrointestinal (Lower):   Patient denies diarrhea and  constipation.  Constitutional:   Patient denies fever, night sweats, weight loss, and fatigue.  Skin:   Patient denies itching and skin rash/ lesion.  Eyes:   Patient denies blurred vision and double vision.  Ears/ Nose/ Throat:   Patient denies sore throat and sinus problems.  Hematologic/Lymphatic:   Patient denies swollen glands and easy bruising.  Cardiovascular:   Patient denies leg swelling and chest pains.  Respiratory:   Patient denies cough and shortness of breath.  Endocrine:   Patient denies excessive thirst.  Musculoskeletal:   Patient denies back pain and joint pain.  Neurological:   Patient denies headaches and dizziness.  Psychologic:   Patient denies depression and anxiety.   VITAL SIGNS:      10/18/2018 09:24 AM  BP 109/71 mmHg  Heart Rate 77 /min  Temperature 97.7 F / 36.5 C   MULTI-SYSTEM PHYSICAL EXAMINATION:    Constitutional: Normally developed. Good grooming.   Respiratory: No labored breathing, no use of accessory muscles.   Cardiovascular: Normal temperature, adequate perfusion of extremities  Skin: No paleness, no jaundice  Gastrointestinal: No mass, no tenderness, no rigidity, non obese abdomen. Significant scars and hernias. Has ileostomy  Eyes: Normal conjunctivae. Normal eyelids.  Musculoskeletal: The wheelchair. Lower extremity somewhat contracted and small.     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 I advised that management with just a reflex voiding will  eventually result in renal failure. I recommended suprapubic tube, Foley catheter, clean intermittent catheterization. They ultimately decided for clean intermittent catheterization. This should be performed 4-6 times daily keeping residuals less than 400 cc.   Also recommended intra-detrusor Botox injection to decrease the high-pressure system. We will schedule this for the operating room.   Cc: Dr. Criss Rosales, M.D.   Signed by Link Snuffer, III, M.D. on 10/18/18 at 10:08 AM (EST

## 2018-11-14 NOTE — Discharge Instructions (Addendum)
Removal of catheter Remove the foley catheter in 1 week (next Monday).  This can be done easily by cutting the side port of the catheter, which will allow the balloon to deflate.  You will see 1-2 teaspoons of clear water as the balloon deflates and then the catheter can be slid out without difficulty.        Cut here   1.  Expect to see some bloody urethral drainage as well as blood in the initial part of your urinary stream for a few days  2.  If you are on a medicine for your prostate i.e. tamsulosin, alfuzosin, doxazosin or terazosin, it is okay to stop that 2-3 days after your procedure  3.  If you are on finasteride, it is okay to stop that immediately  4.  Limit your exertional activity for approximately 2 weeks after the procedure.  If you are having no bloody drainage or pain at that point, you may liberalize your activities  5.  Keep your follow-up appointment that is scheduled.  If you have problems before the appointment such as continued large clots in your urine, difficulty urinating or fever, contact us before at [phone]    General Anesthesia, Adult, Care After This sheet gives you information about how to care for yourself after your procedure. Your health care provider may also give you more specific instructions. If you have problems or questions, contact your health care provider. What can I expect after the procedure? After the procedure, the following side effects are common:  Pain or discomfort at the IV site.  Nausea.  Vomiting.  Sore throat.  Trouble concentrating.  Feeling cold or chills.  Weak or tired.  Sleepiness and fatigue.  Soreness and body aches. These side effects can affect parts of the body that were not involved in surgery. Follow these instructions at home:  For at least 24 hours after the procedure:  Have a responsible adult stay with you. It is important to have someone help care for you until you are awake and alert.  Rest  as needed.  Do not: ? Participate in activities in which you could fall or become injured. ? Drive. ? Use heavy machinery. ? Drink alcohol. ? Take sleeping pills or medicines that cause drowsiness. ? Make important decisions or sign legal documents. ? Take care of children on your own. Eating and drinking  Follow any instructions from your health care provider about eating or drinking restrictions.  When you feel hungry, start by eating small amounts of foods that are soft and easy to digest (bland), such as toast. Gradually return to your regular diet.  Drink enough fluid to keep your urine pale yellow.  If you vomit, rehydrate by drinking water, juice, or clear broth. General instructions  If you have sleep apnea, surgery and certain medicines can increase your risk for breathing problems. Follow instructions from your health care provider about wearing your sleep device: ? Anytime you are sleeping, including during daytime naps. ? While taking prescription pain medicines, sleeping medicines, or medicines that make you drowsy.  Return to your normal activities as told by your health care provider. Ask your health care provider what activities are safe for you.  Take over-the-counter and prescription medicines only as told by your health care provider.  If you smoke, do not smoke without supervision.  Keep all follow-up visits as told by your health care provider. This is important. Contact a health care provider if:  You  have nausea or vomiting that does not get better with medicine.  You cannot eat or drink without vomiting.  You have pain that does not get better with medicine.  You are unable to pass urine.  You develop a skin rash.  You have a fever.  You have redness around your IV site that gets worse. Get help right away if:  You have difficulty breathing.  You have chest pain.  You have blood in your urine or stool, or you vomit blood. Summary  After  the procedure, it is common to have a sore throat or nausea. It is also common to feel tired.  Have a responsible adult stay with you for the first 24 hours after general anesthesia. It is important to have someone help care for you until you are awake and alert.  When you feel hungry, start by eating small amounts of foods that are soft and easy to digest (bland), such as toast. Gradually return to your regular diet.  Drink enough fluid to keep your urine pale yellow.  Return to your normal activities as told by your health care provider. Ask your health care provider what activities are safe for you. This information is not intended to replace advice given to you by your health care provider. Make sure you discuss any questions you have with your health care provider. Document Released: 12/05/2000 Document Revised: 04/14/2017 Document Reviewed: 04/14/2017 Elsevier Interactive Patient Education  2019 [employer].

## 2018-11-14 NOTE — Op Note (Signed)
Operative Note  Preoperative diagnosis:  1.  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  Same  Procedure(s): 1.  Cystoscopy with intra-detrusor Botox injection, 200 units  Surgeon: Link Snuffer, MD  Assistants: None  Anesthesia: MAC  Complications: None immediate  EBL: Minimal  Specimens: 1.  None  Drains/Catheters: 1.  18 French silicone catheter  Intraoperative findings: 1.  Normal anterior urethra.  During insertion of the scope, a flap of tissue of the urethra resulted after scope trauma and therefore a wire was used to navigate the scope into the bladder atraumatically.  Bladder mucosa was quite trabeculated with multiple diverticulum.  There was a small bladder capacity.  Indication: 38 year old male with a history of spina bifida who was managing his bladder with Crede maneuver.  He underwent urodynamics that revealed a very hostile bladder.  There was a loss of compliance that was quite significant.  He had unstable neurogenic detrusor overactivity with a high pressure system.  After full discussion of different options, he elected to proceed with clean intermittent catheterization and in an attempt to improve bladder compliance he presents today for intra-detrusor Botox injection.  Description of procedure:  The patient was identified and consent was obtained.  The patient was taken to the operating room and placed in the supine position.  The patient was placed under MAC anesthesia.  Perioperative antibiotics were administered.  The patient was placed in dorsal lithotomy.  Patient was prepped and draped in a standard sterile fashion and a timeout was performed.  A 21 French cystoscope was advanced into the urethra.  During insertion, there was a mild amount of trauma that caused a flap of tissue to develop in the urethra.  I therefore advanced a wire through the scope and into the bladder so that I could safely  navigate the scope without further trauma.  This was successful.  I inspected the bladder mucosa with the findings noted above.  Bilateral ureteral orifices were visualized and noted so that they would be avoided during Botox injection.  I then sequentially injected 200 units of Botox in 20 different areas of the bladder.  I inspected the bladder mucosa after and there was no significant bleeding.  I therefore removed the scope after replacing the wire and then placed an 18 French silicone catheter which advanced easily into the bladder and placed 10 cc of sterile water into the balloon followed by removal of the wire.  Plan: Patient may remove his catheter at home.  He can remove this in 1 week.  He will follow-up in 1 month to see how intermittent catheterization is going.  He will need repeat Botox injection at 6 months.  I will then plan to repeat urodynamics 3 months later to ensure his bladder is improving.

## 2018-11-15 ENCOUNTER — Encounter (HOSPITAL_COMMUNITY): Payer: Self-pay | Admitting: Urology

## 2018-12-17 ENCOUNTER — Other Ambulatory Visit (HOSPITAL_COMMUNITY): Payer: Self-pay | Admitting: Urology

## 2018-12-17 DIAGNOSIS — N319 Neuromuscular dysfunction of bladder, unspecified: Secondary | ICD-10-CM

## 2018-12-17 DIAGNOSIS — R3914 Feeling of incomplete bladder emptying: Secondary | ICD-10-CM

## 2018-12-19 ENCOUNTER — Other Ambulatory Visit: Payer: Self-pay | Admitting: Radiology

## 2018-12-21 ENCOUNTER — Encounter (HOSPITAL_COMMUNITY): Payer: Self-pay

## 2018-12-21 ENCOUNTER — Ambulatory Visit (HOSPITAL_COMMUNITY)
Admission: RE | Admit: 2018-12-21 | Discharge: 2018-12-21 | Disposition: A | Discharge status: Home / Self Care | Payer: Medicare Other | Source: Ambulatory Visit | Attending: Urology | Admitting: Urology

## 2018-12-21 ENCOUNTER — Other Ambulatory Visit: Payer: Self-pay

## 2018-12-21 VITALS — BP 115/62 | HR 80 | Temp 97.4°F | Resp 16

## 2018-12-21 DIAGNOSIS — N319 Neuromuscular dysfunction of bladder, unspecified: Secondary | ICD-10-CM | POA: Diagnosis present

## 2018-12-21 DIAGNOSIS — R3914 Feeling of incomplete bladder emptying: Secondary | ICD-10-CM | POA: Diagnosis present

## 2018-12-21 LAB — BASIC METABOLIC PANEL
Anion gap: 8 (ref 5–15)
BUN: 18 mg/dL (ref 6–20)
CO2: 22 mmol/L (ref 22–32)
Calcium: 8.9 mg/dL (ref 8.9–10.3)
Chloride: 108 mmol/L (ref 98–111)
Creatinine, Ser: 0.48 mg/dL — ABNORMAL LOW (ref 0.61–1.24)
GFR calc Af Amer: >60 mL/min (ref >60)
GFR calc non Af Amer: >60 mL/min (ref >60)
Glucose, Bld: 81 mg/dL (ref 70–99)
Potassium: 4.7 mmol/L (ref 3.5–5.1)
Sodium: 138 mmol/L (ref 135–145)

## 2018-12-21 LAB — CBC WITH DIFFERENTIAL/PLATELET
Abs Immature Granulocytes: 0.03 10*3/uL (ref 0.00–0.07)
Basophils Absolute: 0 10*3/uL (ref 0.0–0.1)
Basophils Relative: 0 %
Eosinophils Absolute: 0.1 10*3/uL (ref 0.0–0.5)
Eosinophils Relative: 1 %
HCT: 49.4 % (ref 39.0–52.0)
Hemoglobin: 15.4 g/dL (ref 13.0–17.0)
Immature Granulocytes: 0 %
Lymphocytes Relative: 22 %
Lymphs Abs: 1.9 10*3/uL (ref 0.7–4.0)
MCH: 25.6 pg — ABNORMAL LOW (ref 26.0–34.0)
MCHC: 31.2 g/dL (ref 30.0–36.0)
MCV: 82.2 fL (ref 80.0–100.0)
Monocytes Absolute: 0.6 10*3/uL (ref 0.1–1.0)
Monocytes Relative: 6 %
Neutro Abs: 6 10*3/uL (ref 1.7–7.7)
Neutrophils Relative %: 71 %
Platelets: 193 10*3/uL (ref 150–400)
RBC: 6.01 MIL/uL — ABNORMAL HIGH (ref 4.22–5.81)
RDW: 14.7 % (ref 11.5–15.5)
WBC: 8.5 10*3/uL (ref 4.0–10.5)
nRBC: 0 % (ref 0.0–0.2)

## 2018-12-21 LAB — PROTIME-INR
INR: 1 (ref 0.8–1.2)
Prothrombin Time: 12.9 seconds (ref 11.4–15.2)

## 2018-12-21 MED ORDER — CEFAZOLIN SODIUM-DEXTROSE 2-4 GM/100ML-% IV SOLN
2.0000 g | INTRAVENOUS | Status: AC
  Administered 2018-12-21: 11:00:00 2 g via INTRAVENOUS

## 2018-12-21 MED ORDER — MIDAZOLAM HCL 2 MG/2ML IJ SOLN
INTRAMUSCULAR | Status: AC
  Filled 2018-12-21: qty 4

## 2018-12-21 MED ORDER — SODIUM CHLORIDE 0.9 % IV SOLN
INTRAVENOUS | Status: DC
  Administered 2018-12-21: 10:00:00 via INTRAVENOUS

## 2018-12-21 MED ORDER — FENTANYL CITRATE (PF) 100 MCG/2ML IJ SOLN
INTRAMUSCULAR | Status: AC
  Filled 2018-12-21: qty 4

## 2018-12-21 MED ORDER — MIDAZOLAM HCL 2 MG/2ML IJ SOLN
INTRAMUSCULAR | Status: AC | PRN
  Administered 2018-12-21 (×4): 1 mg via INTRAVENOUS

## 2018-12-21 MED ORDER — FLUMAZENIL 0.5 MG/5ML IV SOLN
INTRAVENOUS | Status: AC
  Filled 2018-12-21: qty 5

## 2018-12-21 MED ORDER — FENTANYL CITRATE (PF) 100 MCG/2ML IJ SOLN
INTRAMUSCULAR | Status: AC | PRN
  Administered 2018-12-21 (×2): 50 ug via INTRAVENOUS

## 2018-12-21 MED ORDER — SODIUM CHLORIDE 0.9 % IV SOLN
INTRAVENOUS | Status: AC
  Filled 2018-12-21: qty 250

## 2018-12-21 MED ORDER — NALOXONE HCL 0.4 MG/ML IJ SOLN
INTRAMUSCULAR | Status: AC
  Filled 2018-12-21: qty 1

## 2018-12-21 MED ORDER — CEFAZOLIN SODIUM-DEXTROSE 2-4 GM/100ML-% IV SOLN
INTRAVENOUS | Status: AC
  Administered 2018-12-21: 11:00:00 2 g via INTRAVENOUS
  Filled 2018-12-21: qty 100

## 2018-12-21 NOTE — Consult Note (Signed)
Chief Complaint: Patient was seen in consultation today for suprapubic catheter placement  Referring Physician(s): Bell,Eugene D III  Supervising Physician: Aletta Edouard  Patient Status: [facility] - Out-pt  History of Present Illness: [name] is a 38 y.o. male with history of spina bifida and recurrent urinary tract infections along with neurogenic bladder with detrusor overactivity who presents today for suprapubic catheter placement.  Past Medical History:  Diagnosis Date  . Arnold-Chiari malformation ([place])   . 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 GERD (gastroesophageal reflux disease)   . Heart palpitations    SECONDARY TO VP SHUNT DRAIN  . History of seizure    per pt mother, no seizures since age 63  . Hypertension   . Ileostomy in place [facility]) 2014  . Migraines followed by neurologist @[facility]    taking verapamil  . Neurogenic bladder    INCONTINENT -  . Neurogenic bowel   . Pre-diabetes   . S/P VP shunt   . 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 Urinary incontinence with continuous leakage   . Wears glasses   . Wheelchair bound    CAN TRASFER SELF WITHOUT BOARD    Past Surgical History:  Procedure Laterality Date  . BACK SURGERY  infant   "get skin to stretch over his back; spinal bifida"  . BACK SURGERY  age 35   scoliosis and  removed hump on his back (kyphosis) and put rods in to stabalize back"  . BOTOX INJECTION N/A 11/14/2018   Procedure: BOTOX INJECTION CYSTOSCOPY;  Surgeon: Lucas Mallow, MD;  Location: [facility];  Service: Urology;  Laterality: N/A;  . CHOLECYSTECTOMY N/A 07/06/2013   Procedure: LAPAROSCOPIC CHOLECYSTECTOMY WITH INTRAOPERATIVE CHOLANGIOGRAM;  Surgeon: Madilyn Hook, DO;  Location: [facility];  Service: General;  Laterality: N/A;  . COLON RESECTION N/A 07/08/2013   Procedure: EXPLORATORY LAPAROSCOPY, DIAGNOSTIC  LAPAROSCOPY, PARTIAL COLECTOMY, ABDOMINAL WASHOUT, ABDOMINAL WOUND VAC;  Surgeon: Madilyn Hook, DO;  Location: [facility];  Service: General;  Laterality: N/A;  . HIP SURGERY Bilateral AS CHILD   TENDON RELEASE  . INCISION AND DRAINAGE OF WOUND N/A 11/04/2013   Procedure: IRRIGATION AND DEBRIDEMENT ABDOMINAL WOUND WITH PLACEMENT OF ACELL/VAC;  Surgeon: Theodoro Kos, DO;  Location: [facility];  Service: Plastics;  Laterality: N/A;  . 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 RIGHT COLECTOMY/  APPENDECTOMY/  ILEOSTOMY  NOV 2014  [facility]   surgical complication with cholecystectomy  . UMBILICAL HERNIA REPAIR  child  . VENTRICULOPERITONEAL SHUNT  11-12-2018 per pt and mother---LAST REVISION ~ 06/2015   DUE TO ABD WOUND--- SHUNT DRAINS TO HEART (12/23/2015)    Allergies: Latex; Succinylcholine; Ciprofloxacin; Vancomycin; and Adhesive [tape]  Medications: Prior to Admission medications   Medication Sig Start Date End Date Taking? Authorizing Provider  acetaminophen (TYLENOL) 650 MG CR tablet Take 1,300 mg by mouth every 8 (eight) hours as needed for pain.    [provider]  calcium-vitamin D (OSCAL 500/200 D-3) 500-200 MG-UNIT tablet Take 1 tablet by mouth daily.    [provider]  Cranberry-Vitamin C-Vitamin E (CRANBERRY PLUS VITAMIN C) 4200-20-3 MG-MG-UNIT CAPS Take 1 capsule by mouth daily.    [provider]  docusate sodium (COLACE) 100 MG capsule Take 200 mg by mouth daily.    [provider]  KLOR-CON M20 20 MEQ  tablet Take 20 mEq by mouth 2 (two) times daily. 10/06/18   [provider]  metoprolol tartrate (LOPRESSOR) 50 MG tablet Take 50 mg by mouth 2 (two) times daily.    [provider]  Multiple Vitamin (MULTIVITAMIN) tablet Take 1 tablet by mouth daily.    [provider]  ondansetron (ZOFRAN) 4 MG tablet Take 4 mg by mouth every 8 (eight) hours as needed for nausea or vomiting.    [provider]  rosuvastatin (CRESTOR) 20 MG tablet Take 20 mg by mouth daily. 09/10/18   [provider]  verapamil (VERELAN PM) 120 MG 24 hr capsule Take 120 mg by mouth at bedtime.  10/18/18   [provider]     Family History  Problem Relation Age of Onset  . Diabetes Other   . Hypertension Other   . Cancer Other   . Stroke Other     Social History   Socioeconomic History  . Marital status: Single    Spouse name: Not on file  . Number of children: Not on file  . Years of education: Not on file  . Highest education level: Not on file  Occupational History  . Not on file  Social Needs  . Financial resource strain: Not on file  . Food insecurity:    Worry: Not on file    Inability: Not on file  . Transportation needs:    Medical: Not on file    Non-medical: Not on file  Tobacco Use  . Smoking status: Never Smoker  . Smokeless tobacco: Never Used  Substance and Sexual Activity  . Alcohol use: No  . Drug use: Never  . Sexual activity: Never  Lifestyle  . Physical activity:    Days per week: Not on file    Minutes per session: Not on file  . Stress: Not on file  Relationships  . Social connections:    Talks on phone: Not on file    Gets together: Not on file    Attends religious service: Not on file    Active member of club or organization: Not on file    Attends meetings of clubs or organizations: Not on file    Relationship status: Not on file  Other Topics Concern  . Not on file  Social History Narrative  . Not on file      Review of Systems :currently denies fever, headache, chest pain, dyspnea, cough, abd/back pain, nausea, vomiting or bleeding  Vital Signs: Blood pressure 142/80, heart rate 78, respirations 19, temp 98.3, O2 sats 100% room air   Physical Exam awake, alert.  Chest clear to auscultation bilaterally.  Heart with regular rate and rhythm.  Abdomen soft, positive bowel sounds, right lower quadrant ostomy intact.   Imaging: No results found.  Labs:  CBC: Recent Labs    11/12/18 1031  WBC 6.9  HGB 13.7  HCT 45.7  PLT 175    COAGS: No results for input(s): INR, APTT in the last 8760 hours.  BMP: Recent Labs    11/12/18 1031  NA 141  K 3.8  CL 110  CO2 24  GLUCOSE 125*  BUN 20  CALCIUM 8.8*  CREATININE 0.48*  GFRNONAA >60  GFRAA >60    LIVER FUNCTION TESTS: No results for input(s): BILITOT, AST, ALT, ALKPHOS, PROT, ALBUMIN in the last 8760 hours.  TUMOR MARKERS: No results for input(s): AFPTM, CEA, CA199, CHROMGRNA in the last 8760 hours.  Assessment and Plan: 38 y.o.  male with history of spina bifida and recurrent urinary tract infections along with neurogenic bladder with detrusor overactivity who presents today for suprapubic catheter placement.  Details/risks of procedure, including but not limited to, internal bleeding, infection, injury to adjacent structures discussed with patient/mother with their understanding and consent.   LABS PENDING   Thank you for this interesting consult.  I greatly enjoyed meeting [name] and look forward to participating in their care.  A copy of this report was sent to the requesting provider on this date.  Electronically Signed: D. Rowe Robert, PA-C 12/21/2018, 8:57 AM   I spent a total of 25 minutes   in face to face in clinical consultation, greater than 50% of which was counseling/coordinating care for suprapubic catheter placement

## 2018-12-21 NOTE — Discharge Instructions (Signed)
Suprapubic Catheter Home Guide A suprapubic catheter is a rubber tube used to drain urine from the bladder into a collection bag. The catheter is inserted into the bladder through a small opening in the in the lower abdomen, near the center of the body, above the pubic bone (suprapubic area). There is a tiny balloon filled with germ-free (sterile) water on the end of the catheter that is in the bladder. The balloon helps to keep the catheter in place. Your suprapubic catheter may need to be replaced every 4-6 weeks, or as often as recommended by your health care provider. The collection bag must be emptied every day and cleaned every 2-3 days. The collection bag can be put beside your bed at night and attached to your leg during the day. You may have a large collection bag to use at night and a smaller one to use during the day. What are the risks?  Urine flow can become blocked. This can happen if the catheter is not working correctly, or if you have a blood clot in your bladder or in the catheter.  Tissue near the catheter may can become irritated and bleed.  Bacteria may get into your bladder and cause a urinary tract infection. How do I change the catheter? Supplies needed  Two pairs of sterile gloves.  Catheter.  Two syringes.  Sterile water.  Sterile cleaning solution.  Lubricant.  Collection bags. Changing the catheter To replace your catheter, take the following steps: 1. Drink plenty of fluids during the hours before you plan to change the catheter. 2. Wash your hands with soap and water. If soap and water are not available, use hand sanitizer. 3. Lie on your back and put on sterile gloves. 4. Clean the skin around the catheter opening using the sterile cleaning solution. 5. Remove the water from the balloon using a syringe. 6. Slowly remove the catheter. ? Do not pull on the catheter if it seems stuck. ? Call your health care provider immediately if you have difficulty  removing the catheter. 7. Take off the used gloves, and put on a new pair. 8. Put lubricant on the end of the new catheter that will go into your bladder. 9. Gently slide the catheter through the opening in your abdomen and into your bladder. 10. Wait for some urine to start flowing through the catheter. When urine starts to flow through the catheter, use a new syringe to fill the balloon with sterile water. 11. Attach the collection bag to the end of the catheter. Make sure the connection is tight. 12. Remove the gloves and wash your hands with soap and water. How do I care for my skin around the catheter? Use a clean washcloth and soapy water to clean the skin around your catheter every day. Pat the area dry with a clean towel.  Do not pull on the catheter.  Do not use ointment or lotion on this area unless told by your health care provider.  Check your skin around the catheter every day for signs of infection. Check for: ? Redness, swelling, or pain. ? Fluid or blood. ? Warmth. ? Pus or a bad smell. How do I clean and empty the collection bag? Clean the collection bag every 2-3 days, or as often as told by your health care provider. To do this, take the following steps:  Wash your hands with soap and water. If soap and water are not available, use hand sanitizer.  Disconnect the bag from the  catheter and immediately attach a new bag to the catheter. °· Empty the used bag completely. °· Clean the used bag using one of the following methods: °? Rinse the used bag with warm water and soap. °? Fill the bag with water and add 1 tsp of vinegar. Let it sit for about 30 minutes, then empty the bag. °· Let the bag dry completely, and put it in a clean plastic bag before storing it. °Empty the large collection bag every 8 hours. Empty the small collection bag when it is about ? full. To empty your large or small collection bag, take the following steps: °· Always keep the bag below the level of the  catheter. This keeps urine from flowing backwards into the catheter. °· Hold the bag over the toilet or another container. Turn the valve (spigot) at the bottom of the bag to empty the urine. °? Do not touch the opening of the spigot. °? Do not let the opening touch the toilet or container. °· Close the spigot tightly when the bag is empty. °What are some general tips? ° °· Always wash your hands before and after caring for your catheter and collection bag. Use a mild, fragrance-free soap. If soap and water are not available, use hand sanitizer. °· Clean the catheter with soap and water as often as told by your health care provider. °· Always make sure there are no twists or curls (kinks) in the catheter tube. °· Always make sure there are no leaks in the catheter or collection bag. °· Drink enough fluid to keep your urine clear or pale yellow. °· Do not take baths, swim, or use a hot tub. °When should I seek medical care? °Seek medical care if: °· You leak urine. °· You have redness, swelling, or pain around your catheter opening. °· You have fluid or blood coming from your catheter opening. °· Your catheter opening feels warm to the touch. °· You have pus or a bad smell coming from your catheter opening. °· You have a fever or chills. °· Your urine flow slows down. °· Your urine becomes cloudy or smelly. °When should I seek immediate medical care? °Seek immediate medical care if your catheter comes out, or if you have: °· Nausea. °· Back pain. °· Difficulty changing your catheter. °· Blood in your urine. °· No urine flow for 1 hour. °This information is not intended to replace advice given to you by your health care provider. Make sure you discuss any questions you have with your health care provider. °Document Released: 05/17/2011 Document Revised: 04/27/2016 Document Reviewed: 05/12/2015 °Elsevier Interactive Patient Education © 2019 [employer] °Moderate Conscious Sedation, Adult, Care After °These  instructions provide you with information about caring for yourself after your procedure. Your health care provider may also give you more specific instructions. Your treatment has been planned according to current medical practices, but problems sometimes occur. Call your health care provider if you have any problems or questions after your procedure. °What can I expect after the procedure? °After your procedure, it is common: °· To feel sleepy for several hours. °· To feel clumsy and have poor balance for several hours. °· To have poor judgment for several hours. °· To vomit if you eat too soon. °Follow these instructions at home: °For at least 24 hours after the procedure: ° °· Do not: °? Participate in activities where you could fall or become injured. °? Drive. °? Use heavy machinery. °? Drink alcohol. °? Take   sleeping pills or medicines that cause drowsiness. °? Make important decisions or sign legal documents. °? Take care of children on your own. °· Rest. °Eating and drinking °· Follow the diet recommended by your health care provider. °· If you vomit: °? Drink water, juice, or soup when you can drink without vomiting. °? Make sure you have little or no nausea before eating solid foods. °General instructions °· Have a responsible adult stay with you until you are awake and alert. °· Take over-the-counter and prescription medicines only as told by your health care provider. °· If you smoke, do not smoke without supervision. °· Keep all follow-up visits as told by your health care provider. This is important. °Contact a health care provider if: °· You keep feeling nauseous or you keep vomiting. °· You feel light-headed. °· You develop a rash. °· You have a fever. °Get help right away if: °· You have trouble breathing. °This information is not intended to replace advice given to you by your health care provider. Make sure you discuss any questions you have with your health care provider. °Document Released:  06/19/2013 Document Revised: 02/01/2016 Document Reviewed: 12/19/2015 °Elsevier Interactive Patient Education © 2019 [employer] ° °

## 2018-12-21 NOTE — Procedures (Signed)
Interventional Radiology Procedure Note  Procedure: CT guided suprapubic bladder drainage catheter placement  Complications: None  Estimated Blood Loss: < 10 mL  Findings: EXTREMELY difficult SP tube placement due to body habitus with large abdominal wall pannus containing bowel loops overlying bladder. With traction on pannus, able to place a 12 Fr catheter through left paramedian window into bladder. Bladder depth is very deep.   I am afraid that depth of bladder and chronic weight of his pannus may be poor prognostic factors in maintaining stable and durable SP access in this patient. Recommend gradual upsizing about every month or two under fluoroscopic guidance.  Venetia Night. Kathlene Cote, M.D Pager:  [phone]

## 2019-02-13 ENCOUNTER — Other Ambulatory Visit: Payer: Self-pay | Admitting: Radiology

## 2019-02-14 ENCOUNTER — Other Ambulatory Visit: Payer: Self-pay | Admitting: Radiology

## 2019-02-15 ENCOUNTER — Ambulatory Visit (HOSPITAL_COMMUNITY)
Admission: RE | Admit: 2019-02-15 | Discharge: 2019-02-15 | Disposition: A | Discharge status: Home / Self Care | Payer: Medicare Other | Source: Ambulatory Visit | Attending: Radiology | Admitting: Radiology

## 2019-02-15 ENCOUNTER — Other Ambulatory Visit: Payer: Self-pay

## 2019-02-15 ENCOUNTER — Ambulatory Visit (HOSPITAL_COMMUNITY)
Admission: RE | Admit: 2019-02-15 | Discharge: 2019-02-15 | Disposition: A | Discharge status: Home / Self Care | Payer: Medicare Other | Source: Ambulatory Visit | Attending: Urology | Admitting: Urology

## 2019-02-15 ENCOUNTER — Encounter (HOSPITAL_COMMUNITY): Payer: Self-pay

## 2019-02-15 DIAGNOSIS — Z9049 Acquired absence of other specified parts of digestive tract: Secondary | ICD-10-CM | POA: Diagnosis not present

## 2019-02-15 DIAGNOSIS — R7303 Prediabetes: Secondary | ICD-10-CM | POA: Diagnosis not present

## 2019-02-15 DIAGNOSIS — Z881 Allergy status to other antibiotic agents status: Secondary | ICD-10-CM | POA: Diagnosis not present

## 2019-02-15 DIAGNOSIS — G8220 Paraplegia, unspecified: Secondary | ICD-10-CM | POA: Diagnosis not present

## 2019-02-15 DIAGNOSIS — I10 Essential (primary) hypertension: Secondary | ICD-10-CM | POA: Insufficient documentation

## 2019-02-15 DIAGNOSIS — K219 Gastro-esophageal reflux disease without esophagitis: Secondary | ICD-10-CM | POA: Insufficient documentation

## 2019-02-15 DIAGNOSIS — Z435 Encounter for attention to cystostomy: Secondary | ICD-10-CM | POA: Diagnosis not present

## 2019-02-15 DIAGNOSIS — Q052 Lumbar spina bifida with hydrocephalus: Secondary | ICD-10-CM | POA: Insufficient documentation

## 2019-02-15 DIAGNOSIS — Z888 Allergy status to other drugs, medicaments and biological substances status: Secondary | ICD-10-CM | POA: Insufficient documentation

## 2019-02-15 DIAGNOSIS — Z79899 Other long term (current) drug therapy: Secondary | ICD-10-CM | POA: Diagnosis not present

## 2019-02-15 DIAGNOSIS — Z91040 Latex allergy status: Secondary | ICD-10-CM | POA: Diagnosis not present

## 2019-02-15 DIAGNOSIS — R339 Retention of urine, unspecified: Secondary | ICD-10-CM | POA: Insufficient documentation

## 2019-02-15 DIAGNOSIS — N319 Neuromuscular dysfunction of bladder, unspecified: Secondary | ICD-10-CM | POA: Diagnosis not present

## 2019-02-15 DIAGNOSIS — Z993 Dependence on wheelchair: Secondary | ICD-10-CM | POA: Insufficient documentation

## 2019-02-15 HISTORY — PX: IR CATHETER TUBE CHANGE: IMG717

## 2019-02-15 LAB — BASIC METABOLIC PANEL
Anion gap: 9 (ref 5–15)
BUN: 21 mg/dL — ABNORMAL HIGH (ref 6–20)
CO2: 23 mmol/L (ref 22–32)
Calcium: 9.1 mg/dL (ref 8.9–10.3)
Chloride: 106 mmol/L (ref 98–111)
Creatinine, Ser: 0.67 mg/dL (ref 0.61–1.24)
GFR calc Af Amer: >60 mL/min (ref >60)
GFR calc non Af Amer: >60 mL/min (ref >60)
Glucose, Bld: 72 mg/dL (ref 70–99)
Potassium: 6.6 mmol/L (ref 3.5–5.1)
Sodium: 138 mmol/L (ref 135–145)

## 2019-02-15 LAB — CBC WITH DIFFERENTIAL/PLATELET
Abs Immature Granulocytes: 0.04 10*3/uL (ref 0.00–0.07)
Basophils Absolute: 0.1 10*3/uL (ref 0.0–0.1)
Basophils Relative: 1 %
Eosinophils Absolute: 0.2 10*3/uL (ref 0.0–0.5)
Eosinophils Relative: 3 %
HCT: 55.8 % — ABNORMAL HIGH (ref 39.0–52.0)
Hemoglobin: 15.7 g/dL (ref 13.0–17.0)
Immature Granulocytes: 1 %
Lymphocytes Relative: 25 %
Lymphs Abs: 2.1 10*3/uL (ref 0.7–4.0)
MCH: 24.8 pg — ABNORMAL LOW (ref 26.0–34.0)
MCHC: 28.1 g/dL — ABNORMAL LOW (ref 30.0–36.0)
MCV: 88.3 fL (ref 80.0–100.0)
Monocytes Absolute: 0.7 10*3/uL (ref 0.1–1.0)
Monocytes Relative: 8 %
Neutro Abs: 5.5 10*3/uL (ref 1.7–7.7)
Neutrophils Relative %: 62 %
Platelets: 241 10*3/uL (ref 150–400)
RBC: 6.32 MIL/uL — ABNORMAL HIGH (ref 4.22–5.81)
RDW: 14.1 % (ref 11.5–15.5)
WBC: 9 10*3/uL (ref 4.0–10.5)
nRBC: 0 % (ref 0.0–0.2)

## 2019-02-15 LAB — PROTIME-INR
INR: 0.9 (ref 0.8–1.2)
Prothrombin Time: 12.3 seconds (ref 11.4–15.2)

## 2019-02-15 LAB — POTASSIUM: Potassium: 5.2 mmol/L — ABNORMAL HIGH (ref 3.5–5.1)

## 2019-02-15 MED ORDER — LIDOCAINE HCL 1 % IJ SOLN
INTRAMUSCULAR | Status: AC
  Filled 2019-02-15: qty 20

## 2019-02-15 MED ORDER — LIDOCAINE VISCOUS HCL 2 % MT SOLN
OROMUCOSAL | Status: AC | PRN
  Administered 2019-02-15: 10 mL via OROMUCOSAL

## 2019-02-15 MED ORDER — SODIUM CHLORIDE 0.9 % IV SOLN: INTRAVENOUS | Status: DC

## 2019-02-15 MED ORDER — FENTANYL CITRATE (PF) 100 MCG/2ML IJ SOLN
INTRAMUSCULAR | Status: AC
  Filled 2019-02-15: qty 2

## 2019-02-15 MED ORDER — LIDOCAINE VISCOUS HCL 2 % MT SOLN
OROMUCOSAL | Status: AC
  Filled 2019-02-15: qty 15

## 2019-02-15 MED ORDER — MIDAZOLAM HCL 2 MG/2ML IJ SOLN
INTRAMUSCULAR | Status: AC
  Filled 2019-02-15: qty 2

## 2019-02-15 MED ORDER — IOHEXOL 300 MG/ML  SOLN
50.0000 mL | Freq: Once | INTRAMUSCULAR | Status: AC | PRN
  Administered 2019-02-15: 12:00:00 20 mL

## 2019-02-15 MED ORDER — SODIUM CHLORIDE 0.9% FLUSH: 5.0000 mL | Freq: Three times a day (TID) | INTRAVENOUS | Status: DC

## 2019-02-15 MED ORDER — CEFAZOLIN SODIUM-DEXTROSE 2-4 GM/100ML-% IV SOLN
2.0000 g | INTRAVENOUS | Status: AC
  Administered 2019-02-15: 12:00:00 2 g via INTRAVENOUS

## 2019-02-15 MED ORDER — MIDAZOLAM HCL 2 MG/2ML IJ SOLN
INTRAMUSCULAR | Status: AC | PRN
  Administered 2019-02-15 (×2): 1 mg via INTRAVENOUS

## 2019-02-15 MED ORDER — FENTANYL CITRATE (PF) 100 MCG/2ML IJ SOLN
INTRAMUSCULAR | Status: AC | PRN
  Administered 2019-02-15 (×2): 50 ug via INTRAVENOUS

## 2019-02-15 MED ORDER — LIDOCAINE VISCOUS HCL 2 % MT SOLN: 15.0000 mL | Freq: Once | OROMUCOSAL | Status: DC

## 2019-02-15 MED ORDER — CEFAZOLIN SODIUM-DEXTROSE 2-4 GM/100ML-% IV SOLN
INTRAVENOUS | Status: AC
  Administered 2019-02-15: 12:00:00 2 g via INTRAVENOUS
  Filled 2019-02-15: qty 100

## 2019-02-15 NOTE — H&P (Signed)
Referring Physician(s): Bell,E  Supervising Physician: Sandi Mariscal  Patient Status:  [facility]  Chief Complaint:  Neurogenic bladder  Subjective: Patient familiar to IR service from CT guided placement of 12 French suprapubic catheter on 12/21/2018.  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  He presents today for suprapubic tube exchange/upsizing.  He denies fever, headache, chest pain, dyspnea, cough, abdominal pain, nausea, vomiting or bleeding.  Past Medical History:  Diagnosis Date  . Arnold-Chiari malformation ([place])   . 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 GERD (gastroesophageal reflux disease)   . Heart palpitations    SECONDARY TO VP SHUNT DRAIN  . History of seizure    per pt mother, no seizures since age 46  . Hypertension   . Ileostomy in place [facility]) 2014  . Migraines followed by neurologist @[facility]    taking verapamil  . Neurogenic bladder    INCONTINENT -  . Neurogenic bowel   . Pre-diabetes   . S/P VP shunt   . Spina bifida with hydrocephalus, lumbar region ([place])    CONGENITAL--   VP SHUNT in place--  PARALYZIED MID ABDOMEN DOWN  . Urinary incontinence with continuous leakage   . Wears glasses   . Wheelchair bound    CAN TRASFER SELF WITHOUT BOARD   Past Surgical History:  Procedure Laterality Date  . BACK SURGERY  infant   "get skin to stretch over his back; spinal bifida"  . BACK SURGERY  age 38   scoliosis and  removed hump on his back (kyphosis) and put rods in to stabalize back"  . BOTOX INJECTION N/A 11/14/2018   Procedure: BOTOX INJECTION CYSTOSCOPY;  Surgeon: Lucas Mallow, MD;  [facility];  Service: Urology;  Laterality: N/A;  . CHOLECYSTECTOMY N/A 07/06/2013   Procedure: LAPAROSCOPIC CHOLECYSTECTOMY WITH INTRAOPERATIVE CHOLANGIOGRAM;   Surgeon: Madilyn Hook, DO;  [facility];  Service: General;  Laterality: N/A;  . COLON RESECTION N/A 07/08/2013   Procedure: EXPLORATORY LAPAROSCOPY, DIAGNOSTIC LAPAROSCOPY, PARTIAL COLECTOMY, ABDOMINAL WASHOUT, ABDOMINAL WOUND VAC;  Surgeon: Madilyn Hook, DO;  [facility];  Service: General;  Laterality: N/A;  . HIP SURGERY Bilateral AS CHILD   TENDON RELEASE  . INCISION AND DRAINAGE OF WOUND N/A 11/04/2013   Procedure: IRRIGATION AND DEBRIDEMENT ABDOMINAL WOUND WITH PLACEMENT OF ACELL/VAC;  Surgeon: Theodoro Kos, DO;  Location: [place];  Service: Plastics;  Laterality: N/A;  . 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 RIGHT COLECTOMY/  APPENDECTOMY/  ILEOSTOMY  NOV 2014  [facility]   surgical complication with cholecystectomy  . UMBILICAL HERNIA REPAIR  child  . VENTRICULOPERITONEAL SHUNT  11-12-2018 per pt and mother---LAST REVISION ~ 06/2015   DUE TO ABD WOUND--- SHUNT DRAINS TO HEART (12/23/2015)    Allergies: Latex; Succinylcholine; Ciprofloxacin; Vancomycin; and Adhesive [tape]  Medications: Prior to Admission medications   Medication Sig Start Date End Date Taking? Authorizing Provider  calcium-vitamin D (OSCAL 500/200 D-3) 500-200 MG-UNIT tablet Take 1 tablet by mouth daily.   Yes [provider]  Cranberry-Vitamin C-Vitamin E (CRANBERRY PLUS VITAMIN C) 4200-20-3 MG-MG-UNIT CAPS Take 1 capsule by mouth daily.   Yes [provider]  docusate sodium (COLACE) 100 MG capsule Take 200 mg by mouth daily.   Yes [provider]  KLOR-CON M20 20 MEQ tablet  Take 20 mEq by mouth 2 (two) times daily. 10/06/18  Yes [provider]  metoprolol tartrate (LOPRESSOR) 50 MG tablet Take 50 mg by mouth 2 (two) times daily.   Yes [provider]  Multiple Vitamin (MULTIVITAMIN) tablet Take 1 tablet by mouth daily.   Yes [provider]  rosuvastatin (CRESTOR) 20 MG tablet Take 20 mg by mouth daily. 09/10/18  Yes  [provider]  verapamil (VERELAN PM) 120 MG 24 hr capsule Take 120 mg by mouth at bedtime.  10/18/18  Yes [provider]  acetaminophen (TYLENOL) 650 MG CR tablet Take 1,300 mg by mouth every 8 (eight) hours as needed for pain.    [provider]  ondansetron (ZOFRAN) 4 MG tablet Take 4 mg by mouth every 8 (eight) hours as needed for nausea or vomiting.    [provider]     Vital Signs: BP 126/68   Pulse 89   Temp 98.4 F (36.9 C) (Oral)   Resp 18   Ht 4\' 11"  (1.499 m)   Wt 166 lb (75.3 kg)   SpO2 100%   BMI 33.53 kg/m   Physical Exam awake, alert.  Chest clear to auscultation bilaterally.  Heart with regular rate and rhythm.  Abdomen soft, positive bowel sounds, right lower quadrant ostomy intact; suprapubic catheter in place with small amount of granulation tissue at insertion site, turbid yellow urine in bag.  Imaging: No results found.  Labs:  CBC: Recent Labs    11/12/18 1031 12/21/18 0930  WBC 6.9 8.5  HGB 13.7 15.4  HCT 45.7 49.4  PLT 175 193    COAGS: Recent Labs    12/21/18 0930  INR 1.0    BMP: Recent Labs    11/12/18 1031 12/21/18 0930  NA 141 138  K 3.8 4.7  CL 110 108  CO2 24 22  GLUCOSE 125* 81  BUN 20 18  CALCIUM 8.8* 8.9  CREATININE 0.48* 0.48*  GFRNONAA >60 >60  GFRAA >60 >60    LIVER FUNCTION TESTS: No results for input(s): BILITOT, AST, ALT, ALKPHOS, PROT, ALBUMIN in the last 8760 hours.  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  He presents today for suprapubic tube exchange/upsizing.  Details/risks of procedure, including but not limited to, internal bleeding, infection, injury to adjacent structures discussed with patient and mother with their  understanding and consent.   Electronically Signed: D. Rowe Robert, PA-C 02/15/2019, 10:33 AM   I spent a total of 20 minutes at the the patient's bedside AND on the patient's hospital floor or unit, greater than 50% of which was counseling/coordinating care for suprapubic catheter exchange/upsizing

## 2019-02-15 NOTE — Procedures (Signed)
Pre Procedure Dx: Urinary retention Post Procedural Dx: Same  Successful fluoroscopic guided conversion of existing 12 Fr suprapubic catheter to a 16 Fr balloon retention catheter.  EBL: None Complications: None immediate.  Ronny Bacon, MD Pager #: [phone]

## 2019-02-15 NOTE — Discharge Instructions (Signed)
Suprapubic Catheter Home Guide  A suprapubic catheter is a rubber tube used to drain urine from the bladder into a collection bag. The catheter is inserted into the bladder through a small opening in the in the lower abdomen, near the center of the body, above the pubic bone (suprapubic area). There is a tiny balloon filled with germ-free (sterile) water on the end of the catheter that is in the bladder. The balloon helps to keep the catheter in place.  Your suprapubic catheter may need to be replaced every 4-6 weeks, or as often as recommended by your health care provider. The collection bag must be emptied every day and cleaned every 2-3 days. The collection bag can be put beside your bed at night and attached to your leg during the day. You may have a large collection bag to use at night and a smaller one to use during the day.  What are the risks?  Urine flow can become blocked. This can happen if the catheter is not working correctly, or if you have a blood clot in your bladder or in the catheter.  Tissue near the catheter may can become irritated and bleed.  Bacteria may get into your bladder and cause a urinary tract infection.  How do I change the catheter?  Supplies needed  Two pairs of sterile gloves.  Catheter.  Two syringes.  Sterile water.  Sterile cleaning solution.  Lubricant.  Collection bags.  Changing the catheter  To replace your catheter, take the following steps:  Drink plenty of fluids during the hours before you plan to change the catheter.  Wash your hands with soap and water. If soap and water are not available, use hand sanitizer.  Lie on your back and put on sterile gloves.  Clean the skin around the catheter opening using the sterile cleaning solution.  Remove the water from the balloon using a syringe.  Slowly remove the catheter.  Do not pull on the catheter if it seems stuck.  Call your health care provider immediately if you have difficulty removing the catheter.  Take off the used  gloves, and put on a new pair.  Put lubricant on the end of the new catheter that will go into your bladder.  Gently slide the catheter through the opening in your abdomen and into your bladder.  Wait for some urine to start flowing through the catheter. When urine starts to flow through the catheter, use a new syringe to fill the balloon with sterile water.  Attach the collection bag to the end of the catheter. Make sure the connection is tight.  Remove the gloves and wash your hands with soap and water.  How do I care for my skin around the catheter?  Use a clean washcloth and soapy water to clean the skin around your catheter every day. Pat the area dry with a clean towel.  Do not pull on the catheter.  Do not use ointment or lotion on this area unless told by your health care provider.  Check your skin around the catheter every day for signs of infection. Check for:  Redness, swelling, or pain.  Fluid or blood.  Warmth.  Pus or a bad smell.  How do I clean and empty the collection bag?  Clean the collection bag every 2-3 days, or as often as told by your health care provider. To do this, take the following steps:  Wash your hands with soap and water. If soap and water are   not available, use hand sanitizer.  Disconnect the bag from the catheter and immediately attach a new bag to the catheter.  Empty the used bag completely.  Clean the used bag using one of the following methods:  Rinse the used bag with warm water and soap.  Fill the bag with water and add 1 tsp of vinegar. Let it sit for about 30 minutes, then empty the bag.  Let the bag dry completely, and put it in a clean plastic bag before storing it.  Empty the large collection bag every 8 hours. Empty the small collection bag when it is about ? full. To empty your large or small collection bag, take the following steps:  Always keep the bag below the level of the catheter. This keeps urine from flowing backwards into the catheter.  Hold the bag over the  toilet or another container. Turn the valve (spigot) at the bottom of the bag to empty the urine.  Do not touch the opening of the spigot.  Do not let the opening touch the toilet or container.  Close the spigot tightly when the bag is empty.  What are some general tips?    Always wash your hands before and after caring for your catheter and collection bag. Use a mild, fragrance-free soap. If soap and water are not available, use hand sanitizer.  Clean the catheter with soap and water as often as told by your health care provider.  Always make sure there are no twists or curls (kinks) in the catheter tube.  Always make sure there are no leaks in the catheter or collection bag.  Drink enough fluid to keep your urine clear or pale yellow.  Do not take baths, swim, or use a hot tub.  When should I seek medical care?  Seek medical care if:  You leak urine.  You have redness, swelling, or pain around your catheter opening.  You have fluid or blood coming from your catheter opening.  Your catheter opening feels warm to the touch.  You have pus or a bad smell coming from your catheter opening.  You have a fever or chills.  Your urine flow slows down.  Your urine becomes cloudy or smelly.  When should I seek immediate medical care?  Seek immediate medical care if your catheter comes out, or if you have:  Nausea.  Back pain.  Difficulty changing your catheter.  Blood in your urine.  No urine flow for 1 hour.  This information is not intended to replace advice given to you by your health care provider. Make sure you discuss any questions you have with your health care provider.  Document Released: 05/17/2011 Document Revised: 04/27/2016 Document Reviewed: 05/12/2015  Elsevier Interactive Patient Education  2019 Elsevier Inc[employer]      Moderate Conscious Sedation, Adult, Care After  These instructions provide you with information about caring for yourself after your procedure. Your health care provider may also give you more  specific instructions. Your treatment has been planned according to current medical practices, but problems sometimes occur. Call your health care provider if you have any problems or questions after your procedure.  What can I expect after the procedure?  After your procedure, it is common:  To feel sleepy for several hours.  To feel clumsy and have poor balance for several hours.  To have poor judgment for several hours.  To vomit if you eat too soon.  Follow these instructions at home:  For at least   24 hours after the procedure:    Do not:  Participate in activities where you could fall or become injured.  Drive.  Use heavy machinery.  Drink alcohol.  Take sleeping pills or medicines that cause drowsiness.  Make important decisions or sign legal documents.  Take care of children on your own.  Rest.  Eating and drinking  Follow the diet recommended by your health care provider.  If you vomit:  Drink water, juice, or soup when you can drink without vomiting.  Make sure you have little or no nausea before eating solid foods.  General instructions  Have a responsible adult stay with you until you are awake and alert.  Take over-the-counter and prescription medicines only as told by your health care provider.  If you smoke, do not smoke without supervision.  Keep all follow-up visits as told by your health care provider. This is important.  Contact a health care provider if:  You keep feeling nauseous or you keep vomiting.  You feel light-headed.  You develop a rash.  You have a fever.  Get help right away if:  You have trouble breathing.  This information is not intended to replace advice given to you by your health care provider. Make sure you discuss any questions you have with your health care provider.  Document Released: 06/19/2013 Document Revised: 02/01/2016 Document Reviewed: 12/19/2015  Elsevier Interactive Patient Education  2019 Elsevier Inc.

## 2019-02-15 NOTE — Progress Notes (Signed)
CRITICAL VALUE ALERT  Critical Value:  Potassium 6.6  Date & Time Notified: 02/15/2019 11:25 am   Provider Notified: Rowe Robert PA   Orders Received/Actions taken:none

## 2019-03-01 ENCOUNTER — Emergency Department (HOSPITAL_COMMUNITY): Payer: Medicare Other

## 2019-03-01 ENCOUNTER — Other Ambulatory Visit: Payer: Self-pay

## 2019-03-01 ENCOUNTER — Emergency Department (HOSPITAL_COMMUNITY)
Admission: EM | Admit: 2019-03-01 | Discharge: 2019-03-01 | Disposition: A | Discharge status: Home / Self Care | Payer: Medicare Other | Attending: Emergency Medicine | Admitting: Emergency Medicine

## 2019-03-01 ENCOUNTER — Encounter (HOSPITAL_COMMUNITY): Payer: Self-pay | Admitting: Emergency Medicine

## 2019-03-01 DIAGNOSIS — I10 Essential (primary) hypertension: Secondary | ICD-10-CM | POA: Diagnosis not present

## 2019-03-01 DIAGNOSIS — T83198A Other mechanical complication of other urinary devices and implants, initial encounter: Secondary | ICD-10-CM | POA: Insufficient documentation

## 2019-03-01 DIAGNOSIS — Y69 Unspecified misadventure during surgical and medical care: Secondary | ICD-10-CM | POA: Diagnosis not present

## 2019-03-01 DIAGNOSIS — T83010A Breakdown (mechanical) of cystostomy catheter, initial encounter: Secondary | ICD-10-CM

## 2019-03-01 DIAGNOSIS — Z91040 Latex allergy status: Secondary | ICD-10-CM | POA: Diagnosis not present

## 2019-03-01 DIAGNOSIS — Z466 Encounter for fitting and adjustment of urinary device: Secondary | ICD-10-CM | POA: Diagnosis present

## 2019-03-01 DIAGNOSIS — Z79899 Other long term (current) drug therapy: Secondary | ICD-10-CM | POA: Insufficient documentation

## 2019-03-01 DIAGNOSIS — R52 Pain, unspecified: Secondary | ICD-10-CM

## 2019-03-01 HISTORY — PX: IR CATHETER TUBE CHANGE: IMG717

## 2019-03-01 MED ORDER — LIDOCAINE VISCOUS HCL 2 % MT SOLN
OROMUCOSAL | Status: AC
  Administered 2019-03-01: 5 mL
  Filled 2019-03-01: qty 15

## 2019-03-01 MED ORDER — IOHEXOL 300 MG/ML  SOLN
50.0000 mL | Freq: Once | INTRAMUSCULAR | Status: AC | PRN
  Administered 2019-03-01: 10 mL

## 2019-03-01 NOTE — ED Provider Notes (Signed)
[place] DEPT Provider Note   CSN: [id number] Arrival date & time: 03/01/19  1733     History   Chief Complaint Chief Complaint  Patient presents with  . suprapubic cath came out    HPI [name] is a 38 y.o. male.  He has a history of Arnold-Chiari and is a chronic paraplegic with a suprapubic catheter.  He said the catheter came out today.  He is not sure how long it is been and what he thinks for at least months.  He said he has been passing a little bit of urine through his penis.  He denies any abdominal pain.  No fevers.  He was brought in by his mother who is his caregiver.     The history is provided by the patient.  Male GU Problem Presenting symptoms comment:  Suprapubic out Context: spontaneously   Relieved by:  None tried Worsened by:  Nothing Ineffective treatments:  None tried Associated symptoms: no abdominal pain, no fever and no flank pain     Past Medical History:  Diagnosis Date  . Arnold-Chiari malformation ([place])   . 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 GERD (gastroesophageal reflux disease)   . Heart palpitations    SECONDARY TO VP SHUNT DRAIN  . History of seizure    per pt mother, no seizures since age 89  . Hypertension   . Ileostomy in place [facility]) 2014  . Migraines followed by neurologist @[facility]    taking verapamil  . Neurogenic bladder    INCONTINENT -  . Neurogenic bowel   . Pre-diabetes   . S/P VP shunt   . Spina bifida with hydrocephalus, lumbar region ([place])    CONGENITAL--   VP SHUNT in place--  PARALYZIED MID ABDOMEN DOWN  . Urinary incontinence with continuous leakage   . Wears glasses   . Wheelchair bound    CAN TRASFER SELF WITHOUT BOARD    Patient Active Problem List   Diagnosis Date Noted  . Essential hypertension 12/23/2015  . Hypotension 12/23/2015  . Decubitus ulcer of buttock, stage 3 ([place]) 11/20/2015  . Complicated UTI (urinary tract infection)   .  Sepsis secondary to UTI ([place]) 11/19/2015  . Sepsis ([place]) 07/22/2015  . AKI (acute kidney injury) ([place]) 07/22/2015  . Hyperkalemia 07/22/2015  . Hyponatremia 07/22/2015  . Thrombocytosis ([place]) 07/22/2015  . Leukocytosis 07/22/2015  . Borderline diabetes 07/22/2015  . Arnold-Chiari malformation ([place]) 07/22/2015  . Urinary tract infectious disease   . Headache 11/06/2014  . H/O ventricular shunt   . 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 Severe sepsis ([place]) 10/26/2014  . Renal failure (ARF), acute on chronic (HCC) 10/26/2014  . Severe sepsis with septic shock ([place]) 10/26/2014  . UTI (lower urinary tract infection)   . Anemia in chronic renal disease 09/30/2013  . Esophageal reflux 09/17/2013  . Protein-calorie malnutrition, severe ([place]) 09/17/2013  . Essential hypertension, benign 09/17/2013  . Anemia of other chronic disease 09/17/2013  . Septic shock ([place]) 07/09/2013  . Spina bifida ([place]) 07/07/2013  . Neurogenic bladder 07/07/2013  . Elevated LFTs 07/07/2013    Past Surgical History:  Procedure Laterality Date  . BACK SURGERY  infant   "get skin to stretch over his back; spinal bifida"  . BACK SURGERY  age 84   scoliosis and  removed hump on his back (kyphosis) and put rods in to stabalize back"  . BOTOX INJECTION N/A 11/14/2018  Procedure: BOTOX INJECTION CYSTOSCOPY;  Surgeon: Lucas Mallow, MD;  [facility];  Service: Urology;  Laterality: N/A;  . CHOLECYSTECTOMY N/A 07/06/2013   Procedure: LAPAROSCOPIC CHOLECYSTECTOMY WITH INTRAOPERATIVE CHOLANGIOGRAM;  Surgeon: Madilyn Hook, DO;  [facility];  Service: General;  Laterality: N/A;  . COLON RESECTION N/A 07/08/2013   Procedure: EXPLORATORY LAPAROSCOPY, DIAGNOSTIC LAPAROSCOPY, PARTIAL COLECTOMY, ABDOMINAL WASHOUT, ABDOMINAL WOUND VAC;  Surgeon: Madilyn Hook, DO;  [facility];  Service: General;  Laterality: N/A;  . HIP SURGERY Bilateral AS CHILD   TENDON RELEASE  . INCISION AND DRAINAGE OF WOUND N/A  11/04/2013   Procedure: IRRIGATION AND DEBRIDEMENT ABDOMINAL WOUND WITH PLACEMENT OF ACELL/VAC;  Surgeon: Theodoro Kos, DO;  Location: [place];  Service: Plastics;  Laterality: N/A;  . IR CATHETER TUBE CHANGE  02/15/2019  . 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 RIGHT COLECTOMY/  APPENDECTOMY/  ILEOSTOMY  NOV 2014  [facility]   surgical complication with cholecystectomy  . UMBILICAL HERNIA REPAIR  child  . VENTRICULOPERITONEAL SHUNT  11-12-2018 per pt and mother---LAST REVISION ~ 06/2015   DUE TO ABD WOUND--- SHUNT DRAINS TO HEART (12/23/2015)        Home Medications    Prior to Admission medications   Medication Sig Start Date End Date Taking? Authorizing Provider  acetaminophen (TYLENOL) 650 MG CR tablet Take 1,300 mg by mouth every 8 (eight) hours as needed for pain.    [provider]  calcium-vitamin D (OSCAL 500/200 D-3) 500-200 MG-UNIT tablet Take 1 tablet by mouth daily.    [provider]  Cranberry-Vitamin C-Vitamin E (CRANBERRY PLUS VITAMIN C) 4200-20-3 MG-MG-UNIT CAPS Take 1 capsule by mouth daily.    [provider]  docusate sodium (COLACE) 100 MG capsule Take 200 mg by mouth daily.    [provider]  KLOR-CON M20 20 MEQ tablet Take 20 mEq by mouth 2 (two) times daily. 10/06/18   [provider]  metoprolol tartrate (LOPRESSOR) 50 MG tablet Take 50 mg by mouth 2 (two) times daily.    [provider]  Multiple Vitamin (MULTIVITAMIN) tablet Take 1 tablet by mouth daily.    [provider]  ondansetron (ZOFRAN) 4 MG tablet Take 4 mg by mouth every 8 (eight) hours as needed for nausea or vomiting.    [provider]  rosuvastatin (CRESTOR) 20 MG tablet Take 20 mg by mouth daily. 09/10/18   [provider]  verapamil (VERELAN PM) 120 MG 24 hr capsule Take 120 mg by mouth at bedtime.  10/18/18   [provider]    Family History Family History  Problem Relation  Age of Onset  . Diabetes Other   . Hypertension Other   . Cancer Other   . Stroke Other     Social History Social History   Tobacco Use  . Smoking status: Never Smoker  . Smokeless tobacco: Never Used  Substance Use Topics  . Alcohol use: No  . Drug use: Never     Allergies   Latex, Succinylcholine, Ciprofloxacin, Vancomycin, and Adhesive [tape]   Review of Systems Review of Systems  Constitutional: Negative for fever.  Gastrointestinal: Negative for abdominal pain.  Genitourinary: Negative for flank pain.     Physical Exam Updated Vital Signs BP (!) 144/82 (BP Location: Right Arm)   Pulse 76   Temp 98.2 F (36.8 C) (Oral)   Resp 17   SpO2 96%   Physical Exam Vitals signs and nursing  note reviewed.  Constitutional:      Appearance: He is well-developed.  HENT:     Head: Atraumatic.  Eyes:     Conjunctiva/sclera: Conjunctivae normal.  Neck:     Musculoskeletal: Neck supple.  Cardiovascular:     Rate and Rhythm: Normal rate and regular rhythm.     Heart sounds: No murmur.  Pulmonary:     Effort: Pulmonary effort is normal. No respiratory distress.     Breath sounds: Normal breath sounds.  Abdominal:     Palpations: Abdomen is soft.     Tenderness: There is no abdominal tenderness.     Comments: He has multiple surgery scars over his abdomen.  There is a colostomy in his right lower quadrant.  There is a small urostomy site in his left lower quadrant that is nontender.  There is no evidence of any bleeding there.  Skin:    General: Skin is warm and dry.  Neurological:     Mental Status: He is alert. Mental status is at baseline.      ED Treatments / Results  Labs (all labs ordered are listed, but only abnormal results are displayed) Labs Reviewed - No data to display  EKG    Radiology No results found.  Procedures Procedures (including critical care time)  Medications Ordered in ED Medications - No data to display   Initial Impression  / Assessment and Plan / ED Course  I have reviewed the triage vital signs and the nursing notes.  Pertinent labs & imaging results that were available during my care of the patient were reviewed by me and considered in my medical decision making (see chart for details).  Clinical Course as of Mar 01 921  Fri Mar 01, 2019  1828 I was able to reach out to the patient's mother.  She says the catheter is been in since April and it just fell out a few hours ago.  She said radiology had placed it.  Again to take a look and see if I find a note to show the size of the catheter because the patient and the mother do not know.   [MB]  9211 Reviewed prior note from interventional radiology regarding the suprapubic placement.  It appears that it was a 66 [place] catheter.   [MB]  1919 Attempted to place a suprapubic 12 [place] and went in approximately 4 cm but no urine came out.  We will have the patient take something p.o. and see if we can fill him up a little bit and see if the catheter would go better that way.   [MB]  2013 Attempted again to place a catheter after the patient took some p.o. and there was still no success.  We will reach out to interventional radiology for recommendations.   [MB]  2019 Discussed with interventional radiology who will evaluate the patient for possible tube exchange.   [MB]  2144 Patient was taken to the interventional radiology suite where he had a suprapubic catheter replaced.  Anticipate on return he will be able to be discharged back home.   [MB]    Clinical Course User Index [MB] Hayden Rasmussen, MD        Final Clinical Impressions(s) / ED Diagnoses   Final diagnoses:  Suprapubic catheter dysfunction, initial encounter [facility])    ED Discharge Orders    None       Hayden Rasmussen, MD 03/02/19 [phone]

## 2019-03-01 NOTE — ED Notes (Signed)
Pt transferred to IR 

## 2019-03-01 NOTE — ED Notes (Signed)
Talked to [facility], pt mother, she said she was trying to flush the pt's cath. She wasn't sure which tube to flush. She said, "I took the middle tube off, rinsed it real good to make sure it was flowing okay, then when I come back in there, it was coming out of him. Nobody every showed me which port to use. Look at the tubing inside the bag because I'm worried the balloon is left up in him." She is sitting outside the ED waiting because she doesn't have a car.

## 2019-03-01 NOTE — ED Notes (Signed)
Talked to pt's mother, Adela Lank, and let her know pt went to IR.

## 2019-03-01 NOTE — ED Notes (Signed)
Pt provided water at MD's request.

## 2019-03-01 NOTE — ED Notes (Signed)
Pt returned from IR.

## 2019-03-01 NOTE — ED Triage Notes (Signed)
Pt's mother reports that she was in process of irrigating suprapubic cath when noticed it come out. Denies pains at this time

## 2019-03-01 NOTE — Procedures (Signed)
Interventional Radiology Procedure:   Indications: Dislodged suprapubic tube  Procedure: Replacement of SP tube  Findings: New 16 Fr balloon retention tube in bladder  Complications: None     EBL: Less than 10 ml  Plan: Return to ED.     Taetum Flewellen R. Anselm Pancoast, MD  Pager: [phone]

## 2019-03-01 NOTE — Discharge Instructions (Addendum)
You were seen in the emergency department for your suprapubic catheter that had fallen out.  We were unable to replace it in the emergency department and interventional radiology was able to replace it.  We recommend that you follow-up with urology so they can get you on the schedule for routine care of your catheter.  Please return if any concerns.

## 2019-03-02 ENCOUNTER — Encounter (HOSPITAL_COMMUNITY): Payer: Self-pay | Admitting: Diagnostic Radiology

## 2019-04-24 ENCOUNTER — Other Ambulatory Visit: Payer: Self-pay | Admitting: Urology

## 2019-05-06 ENCOUNTER — Emergency Department (HOSPITAL_COMMUNITY)
Admission: EM | Admit: 2019-05-06 | Discharge: 2019-05-06 | Disposition: A | Discharge status: Home / Self Care | Payer: Medicare Other | Attending: Emergency Medicine | Admitting: Emergency Medicine

## 2019-05-06 ENCOUNTER — Encounter (HOSPITAL_COMMUNITY): Payer: Self-pay | Admitting: Emergency Medicine

## 2019-05-06 ENCOUNTER — Other Ambulatory Visit: Payer: Self-pay

## 2019-05-06 ENCOUNTER — Emergency Department (HOSPITAL_COMMUNITY): Payer: Medicare Other

## 2019-05-06 DIAGNOSIS — Z993 Dependence on wheelchair: Secondary | ICD-10-CM | POA: Diagnosis not present

## 2019-05-06 DIAGNOSIS — G808 Other cerebral palsy: Secondary | ICD-10-CM | POA: Insufficient documentation

## 2019-05-06 DIAGNOSIS — Z932 Ileostomy status: Secondary | ICD-10-CM | POA: Insufficient documentation

## 2019-05-06 DIAGNOSIS — Z79899 Other long term (current) drug therapy: Secondary | ICD-10-CM | POA: Diagnosis not present

## 2019-05-06 DIAGNOSIS — T83010A Breakdown (mechanical) of cystostomy catheter, initial encounter: Secondary | ICD-10-CM

## 2019-05-06 DIAGNOSIS — Y69 Unspecified misadventure during surgical and medical care: Secondary | ICD-10-CM | POA: Diagnosis not present

## 2019-05-06 DIAGNOSIS — Z888 Allergy status to other drugs, medicaments and biological substances status: Secondary | ICD-10-CM | POA: Insufficient documentation

## 2019-05-06 DIAGNOSIS — Q0703 Arnold-Chiari syndrome with spina bifida and hydrocephalus: Secondary | ICD-10-CM | POA: Diagnosis not present

## 2019-05-06 DIAGNOSIS — I10 Essential (primary) hypertension: Secondary | ICD-10-CM | POA: Insufficient documentation

## 2019-05-06 DIAGNOSIS — N319 Neuromuscular dysfunction of bladder, unspecified: Secondary | ICD-10-CM | POA: Diagnosis not present

## 2019-05-06 DIAGNOSIS — Z87440 Personal history of urinary (tract) infections: Secondary | ICD-10-CM | POA: Diagnosis not present

## 2019-05-06 DIAGNOSIS — T83198A Other mechanical complication of other urinary devices and implants, initial encounter: Secondary | ICD-10-CM | POA: Insufficient documentation

## 2019-05-06 DIAGNOSIS — Z881 Allergy status to other antibiotic agents status: Secondary | ICD-10-CM | POA: Insufficient documentation

## 2019-05-06 DIAGNOSIS — T83030A Leakage of cystostomy catheter, initial encounter: Secondary | ICD-10-CM | POA: Diagnosis present

## 2019-05-06 DIAGNOSIS — Z91040 Latex allergy status: Secondary | ICD-10-CM | POA: Insufficient documentation

## 2019-05-06 DIAGNOSIS — R52 Pain, unspecified: Secondary | ICD-10-CM

## 2019-05-06 DIAGNOSIS — Z982 Presence of cerebrospinal fluid drainage device: Secondary | ICD-10-CM | POA: Insufficient documentation

## 2019-05-06 HISTORY — PX: IR CATHETER TUBE CHANGE: IMG717

## 2019-05-06 MED ORDER — IOHEXOL 300 MG/ML  SOLN
50.0000 mL | Freq: Once | INTRAMUSCULAR | Status: AC | PRN
  Administered 2019-05-06: 17:00:00 20 mL

## 2019-05-06 NOTE — Discharge Instructions (Addendum)
Please be sure to follow-up with your physician.  Return here for concerning changes in your condition. 

## 2019-05-06 NOTE — ED Notes (Signed)
Pt transported to IR 

## 2019-05-06 NOTE — Procedures (Signed)
Pre procedural Dx: Neurogenic Bladder Post procedural Dx: Same  Successful image guided replacement / conversion to 18 Fr balloon retention suprapubic catheter.  EBL: Minimal Complications: None immediate  Ronny Bacon, MD Pager #: [phone]

## 2019-05-06 NOTE — ED Provider Notes (Signed)
[name] DEPT Provider Note   CSN: [id number] Arrival date & time: 05/06/19  1121     History   Chief Complaint Chief Complaint  Patient presents with  . catheter problems    HPI [name] is a 38 y.o. male with past medical history significant for of Arnold-Chiari malformation, congenital paraplegia with suprapubic catheter presents to emergency department today with chief complaint of catheter leaking. Pt reports for the last 3 days he noticed his diaper was wet and that there was urine on the floor. He tried calling his urologist to schedule an appointment but the office was full and unable to see him today so they recommended ED evaluation.  Pt's mother reports pt had catheter changed 04/18/19 in office by Dr. Gloriann Loan and a wire was used with placement. He currently has 67 [place]. Pt denies any fever, chills, chest pain, abdominal pain, hematuria.  Chart review shows pt was seen in ED in June 2020 and catheter was replaced by IR after multiple unsuccessful attempts by EDP.  Past Medical History:  Diagnosis Date  . Arnold-Chiari malformation ([place])   . 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 GERD (gastroesophageal reflux disease)   . Heart palpitations    SECONDARY TO VP SHUNT DRAIN  . History of seizure    per pt mother, no seizures since age 6  . Hypertension   . Ileostomy in place [facility]) 2014  . Migraines followed by neurologist @[facility]    taking verapamil  . Neurogenic bladder    INCONTINENT -  . Neurogenic bowel   . Pre-diabetes   . S/P VP shunt   . Spina bifida with hydrocephalus, lumbar region ([place])    CONGENITAL--   VP SHUNT in place--  PARALYZIED MID ABDOMEN DOWN  . Urinary incontinence with continuous leakage   . Wears glasses   . Wheelchair bound    CAN TRASFER SELF WITHOUT BOARD    Patient Active Problem List   Diagnosis Date Noted  . Essential hypertension 12/23/2015  . Hypotension 12/23/2015   . Decubitus ulcer of buttock, stage 3 ([place]) 11/20/2015  . Complicated UTI (urinary tract infection)   . Sepsis secondary to UTI ([place]) 11/19/2015  . Sepsis ([place]) 07/22/2015  . AKI (acute kidney injury) ([place]) 07/22/2015  . Hyperkalemia 07/22/2015  . Hyponatremia 07/22/2015  . Thrombocytosis ([place]) 07/22/2015  . Leukocytosis 07/22/2015  . Borderline diabetes 07/22/2015  . Arnold-Chiari malformation ([place]) 07/22/2015  . Urinary tract infectious disease   . Headache 11/06/2014  . H/O ventricular shunt   . 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 Severe sepsis ([place]) 10/26/2014  . Renal failure (ARF), acute on chronic (HCC) 10/26/2014  . Severe sepsis with septic shock ([place]) 10/26/2014  . UTI (lower urinary tract infection)   . Anemia in chronic renal disease 09/30/2013  . Esophageal reflux 09/17/2013  . Protein-calorie malnutrition, severe ([place]) 09/17/2013  . Essential hypertension, benign 09/17/2013  . Anemia of other chronic disease 09/17/2013  . Septic shock ([place]) 07/09/2013  . Spina bifida ([place]) 07/07/2013  . Neurogenic bladder 07/07/2013  . Elevated LFTs 07/07/2013    Past Surgical History:  Procedure Laterality Date  . BACK SURGERY  infant   "get skin to stretch over his back; spinal bifida"  . BACK SURGERY  age 46   scoliosis and  removed hump on his back (kyphosis) and put rods in to stabalize back"  . BOTOX INJECTION N/A 11/14/2018   Procedure:  BOTOX INJECTION CYSTOSCOPY;  Surgeon: Lucas Mallow, MD;  Location: [facility];  Service: Urology;  Laterality: N/A;  . CHOLECYSTECTOMY N/A 07/06/2013   Procedure: LAPAROSCOPIC CHOLECYSTECTOMY WITH INTRAOPERATIVE CHOLANGIOGRAM;  Surgeon: Madilyn Hook, DO;  Location: [facility];  Service: General;  Laterality: N/A;  . COLON RESECTION N/A 07/08/2013   Procedure: EXPLORATORY LAPAROSCOPY, DIAGNOSTIC LAPAROSCOPY, PARTIAL COLECTOMY, ABDOMINAL WASHOUT, ABDOMINAL WOUND VAC;  Surgeon: Madilyn Hook, DO;  Location: [facility];  Service: General;   Laterality: N/A;  . HIP SURGERY Bilateral AS CHILD   TENDON RELEASE  . INCISION AND DRAINAGE OF WOUND N/A 11/04/2013   Procedure: IRRIGATION AND DEBRIDEMENT ABDOMINAL WOUND WITH PLACEMENT OF ACELL/VAC;  Surgeon: Theodoro Kos, DO;  Location: [place];  Service: Plastics;  Laterality: N/A;  . IR CATHETER TUBE CHANGE  02/15/2019  . IR CATHETER TUBE CHANGE  03/01/2019  . 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 RIGHT COLECTOMY/  APPENDECTOMY/  ILEOSTOMY  NOV 2014  [facility]   surgical complication with cholecystectomy  . UMBILICAL HERNIA REPAIR  child  . VENTRICULOPERITONEAL SHUNT  11-12-2018 per pt and mother---LAST REVISION ~ 06/2015   DUE TO ABD WOUND--- SHUNT DRAINS TO HEART (12/23/2015)        Home Medications    Prior to Admission medications   Medication Sig Start Date End Date Taking? Authorizing Provider  acetaminophen (TYLENOL) 650 MG CR tablet Take 1,300 mg by mouth every 8 (eight) hours as needed for pain.    [provider]  calcium-vitamin D (OSCAL 500/200 D-3) 500-200 MG-UNIT tablet Take 1 tablet by mouth daily.    [provider]  Cranberry-Vitamin C-Vitamin E (CRANBERRY PLUS VITAMIN C) 4200-20-3 MG-MG-UNIT CAPS Take 1 capsule by mouth daily.    [provider]  docusate sodium (COLACE) 100 MG capsule Take 200 mg by mouth daily.    [provider]  KLOR-CON M20 20 MEQ tablet Take 20 mEq by mouth 2 (two) times daily. 10/06/18   [provider]  metoprolol tartrate (LOPRESSOR) 50 MG tablet Take 50 mg by mouth 2 (two) times daily.    [provider]  Multiple Vitamin (MULTIVITAMIN) tablet Take 1 tablet by mouth daily.    [provider]  ondansetron (ZOFRAN) 4 MG tablet Take 4 mg by mouth every 8 (eight) hours as needed for nausea or vomiting.    [provider]  rosuvastatin (CRESTOR) 20 MG tablet Take 20 mg by mouth daily. 09/10/18   [provider]  verapamil (VERELAN  PM) 120 MG 24 hr capsule Take 120 mg by mouth at bedtime.  10/18/18   [provider]    Family History Family History  Problem Relation Age of Onset  . Diabetes Other   . Hypertension Other   . Cancer Other   . Stroke Other     Social History Social History   Tobacco Use  . Smoking status: Never Smoker  . Smokeless tobacco: Never Used  Substance Use Topics  . Alcohol use: No  . Drug use: Never     Allergies   Latex, Succinylcholine, Ciprofloxacin, Vancomycin, and Adhesive [tape]   Review of Systems Review of Systems  Constitutional: Negative for chills and fever.  HENT: Negative for congestion, rhinorrhea, sinus pressure and sore throat.   Eyes: Negative for pain and redness.  Respiratory: Negative for cough, shortness of breath and wheezing.   Cardiovascular: Negative for chest pain and palpitations.  Gastrointestinal: Negative for abdominal pain, constipation, diarrhea, nausea  and vomiting.  Genitourinary: Positive for difficulty urinating. Negative for decreased urine volume, dysuria, flank pain, hematuria and testicular pain.  Musculoskeletal: Negative for arthralgias, back pain, myalgias and neck pain.  Skin: Negative for rash and wound.  Neurological: Negative for dizziness, syncope, weakness, numbness and headaches.  Psychiatric/Behavioral: Negative for confusion.     Physical Exam Updated Vital Signs BP (!) 153/94   Pulse 64   Temp 97.6 F (36.4 C) (Oral)   Resp 18   SpO2 100%   Physical Exam Vitals signs and nursing note reviewed.  Constitutional:      General: He is not in acute distress.    Appearance: He is not ill-appearing.  HENT:     Head: Normocephalic and atraumatic.     Right Ear: Tympanic membrane and external ear normal.     Left Ear: Tympanic membrane and external ear normal.     Nose: Nose normal.     Mouth/Throat:     Mouth: Mucous membranes are moist.     Pharynx: Oropharynx is clear.  Eyes:     General: No scleral  icterus.       Right eye: No discharge.        Left eye: No discharge.     Extraocular Movements: Extraocular movements intact.     Conjunctiva/sclera: Conjunctivae normal.     Pupils: Pupils are equal, round, and reactive to light.  Neck:     Musculoskeletal: Normal range of motion.     Vascular: No JVD.  Cardiovascular:     Rate and Rhythm: Normal rate and regular rhythm.     Pulses: Normal pulses.          Radial pulses are 2+ on the right side and 2+ on the left side.     Heart sounds: Normal heart sounds.  Pulmonary:     Comments: Lungs clear to auscultation in all fields. Symmetric chest rise. No wheezing, rales, or rhonchi. Abdominal:     Tenderness: There is no right CVA tenderness or left CVA tenderness.     Comments: Colostomy in place in right lower quadrant. Multiple surgical scars on abdomen. No tenderness to palpation. Suprapubic catheter in place without signs of infection.   Skin:    General: Skin is warm and dry.     Capillary Refill: Capillary refill takes less than 2 seconds.  Neurological:     Mental Status: He is oriented to person, place, and time.     GCS: GCS eye subscore is 4. GCS verbal subscore is 5. GCS motor subscore is 6.     Comments: Fluent speech, no facial droop.  Psychiatric:        Behavior: Behavior normal.      ED Treatments / Results  Labs (all labs ordered are listed, but only abnormal results are displayed) Labs Reviewed - No data to display  EKG None  Radiology No results found.  Procedures Procedures (including critical care time)  Medications Ordered in ED Medications - No data to display   Initial Impression / Assessment and Plan / ED Course  I have reviewed the triage vital signs and the nursing notes.  Pertinent labs & imaging results that were available during my care of the patient were reviewed by me and considered in my medical decision making (see chart for details).  Pt presents with difficulties with  suprapubic catheter flushing over there last 3 days. He is non toxic in appearance, afebrile, vital signs are stable. RN attempted to flush  while in the ED and was unsuccessful. IR paged, MD is currently in a procedure but will return call soon. This is shared visit with ED attending Dr. Wilson Singer. He has personally seen and evaluated this patient.  Patient care transferred to Dr. Vanita Panda at the end of my shift. Patient presentation, ED course, and plan of care discussed with review of all pertinent labs and imaging. Please see his note for further details regarding further ED course and disposition. Pt will likely need catheter changed by IR.   Final Clinical Impressions(s) / ED Diagnoses   Final diagnoses:  None    ED Discharge Orders    None       Flint Melter 05/06/19 1536    Virgel Manifold, MD 05/10/19 1143

## 2019-05-06 NOTE — ED Notes (Addendum)
RN unable to flush suprapubic catheter at this time. Emeterio Reeve, PA aware.

## 2019-05-06 NOTE — H&P (Signed)
Chief Complaint: Clogged supra-pubic catheter  Referring Physician(s): ED  Supervising Physician: Sandi Mariscal  Patient Status: [facility] - ED  History of Present Illness: [name] is a 38 y.o. male who is familiar to IR service.  He has undergone CT guided placement of 12 French suprapubic catheter on 12/21/2018.    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    He presented to the ED today with a clogged suprapubic catheter.  His mother is with him in the room and states he had it exchanged last week in the Urology office. They placed a red rubber catheter.  He reports it has not drained since last night.  Flushing was unsuccessful.  He is NPO. No nausea/vomiting. No Fever/chills. ROS negative.   Past Medical History:  Diagnosis Date  . Arnold-Chiari malformation ([place])   . 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 GERD (gastroesophageal reflux disease)   . Heart palpitations    SECONDARY TO VP SHUNT DRAIN  . History of seizure    per pt mother, no seizures since age 61  . Hypertension   . Ileostomy in place [facility]) 2014  . Migraines followed by neurologist @[facility]    taking verapamil  . Neurogenic bladder    INCONTINENT -  . Neurogenic bowel   . Pre-diabetes   . S/P VP shunt   . Spina bifida with hydrocephalus, lumbar region ([place])    CONGENITAL--   VP SHUNT in place--  PARALYZIED MID ABDOMEN DOWN  . Urinary incontinence with continuous leakage   . Wears glasses   . Wheelchair bound    CAN TRASFER SELF WITHOUT BOARD    Past Surgical History:  Procedure Laterality Date  . BACK SURGERY  infant   "get skin to stretch over his back; spinal bifida"  . BACK SURGERY  age 57   scoliosis and  removed hump on his back (kyphosis) and put rods in to stabalize back"  . BOTOX  INJECTION N/A 11/14/2018   Procedure: BOTOX INJECTION CYSTOSCOPY;  Surgeon: Lucas Mallow, MD;  [facility];  Service: Urology;  Laterality: N/A;  . CHOLECYSTECTOMY N/A 07/06/2013   Procedure: LAPAROSCOPIC CHOLECYSTECTOMY WITH INTRAOPERATIVE CHOLANGIOGRAM;  Surgeon: Madilyn Hook, DO;  [facility];  Service: General;  Laterality: N/A;  . COLON RESECTION N/A 07/08/2013   Procedure: EXPLORATORY LAPAROSCOPY, DIAGNOSTIC LAPAROSCOPY, PARTIAL COLECTOMY, ABDOMINAL WASHOUT, ABDOMINAL WOUND VAC;  Surgeon: Madilyn Hook, DO;  Location[facility];  Service: General;  Laterality: N/A;  . HIP SURGERY Bilateral AS CHILD   TENDON RELEASE  . INCISION AND DRAINAGE OF WOUND N/A 11/04/2013   Procedure: IRRIGATION AND DEBRIDEMENT ABDOMINAL WOUND WITH PLACEMENT OF ACELL/VAC;  Surgeon: Theodoro Kos, DO;  Location: [place];  Service: Plastics;  Laterality: N/A;  . IR CATHETER TUBE CHANGE  02/15/2019  . IR CATHETER TUBE CHANGE  03/01/2019  . 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 RIGHT COLECTOMY/  APPENDECTOMY/  ILEOSTOMY  NOV 2014  [facility]   surgical complication with cholecystectomy  . UMBILICAL HERNIA REPAIR  child  . VENTRICULOPERITONEAL SHUNT  11-12-2018 per pt and mother---LAST REVISION ~ 06/2015   DUE TO ABD WOUND--- SHUNT DRAINS TO HEART (12/23/2015)    Allergies: Latex, Succinylcholine, Ciprofloxacin, Vancomycin, and Adhesive [tape]  Medications: Prior to Admission medications  Medication Sig Start Date End Date Taking? Authorizing Provider  acetaminophen (TYLENOL) 650 MG CR tablet Take 1,300 mg by mouth every 8 (eight) hours as needed for pain.    [provider]  calcium-vitamin D (OSCAL 500/200 D-3) 500-200 MG-UNIT tablet Take 1 tablet by mouth daily.    [provider]  Cranberry-Vitamin C-Vitamin E (CRANBERRY PLUS VITAMIN C) 4200-20-3 MG-MG-UNIT CAPS Take 1 capsule by mouth daily.    [provider]  docusate sodium (COLACE) 100 MG  capsule Take 200 mg by mouth daily.    [provider]  KLOR-CON M20 20 MEQ tablet Take 20 mEq by mouth 2 (two) times daily. 10/06/18   [provider]  metoprolol tartrate (LOPRESSOR) 50 MG tablet Take 50 mg by mouth 2 (two) times daily.    [provider]  Multiple Vitamin (MULTIVITAMIN) tablet Take 1 tablet by mouth daily.    [provider]  ondansetron (ZOFRAN) 4 MG tablet Take 4 mg by mouth every 8 (eight) hours as needed for nausea or vomiting.    [provider]  rosuvastatin (CRESTOR) 20 MG tablet Take 20 mg by mouth daily. 09/10/18   [provider]  verapamil (VERELAN PM) 120 MG 24 hr capsule Take 120 mg by mouth at bedtime.  10/18/18   [provider]     Family History  Problem Relation Age of Onset  . Diabetes Other   . Hypertension Other   . Cancer Other   . Stroke Other     Social History   Socioeconomic History  . Marital status: Single    Spouse name: Not on file  . Number of children: Not on file  . Years of education: Not on file  . Highest education level: Not on file  Occupational History  . Not on file  Social Needs  . Financial resource strain: Not on file  . Food insecurity    Worry: Not on file    Inability: Not on file  . Transportation needs    Medical: Not on file    Non-medical: Not on file  Tobacco Use  . Smoking status: Never Smoker  . Smokeless tobacco: Never Used  Substance and Sexual Activity  . Alcohol use: No  . Drug use: Never  . Sexual activity: Never  Lifestyle  . Physical activity    Days per week: Not on file    Minutes per session: Not on file  . Stress: Not on file  Relationships  . Social Herbalist on phone: Not on file    Gets together: Not on file    Attends religious service: Not on file    Active member of club or organization: Not on file    Attends meetings of clubs or organizations: Not on file    Relationship status: Not on file  Other  Topics Concern  . Not on file  Social History Narrative  . Not on file     Review of Systems: A 12 point ROS discussed and pertinent positives are indicated in the HPI above.  All other systems are negative.  Review of Systems  Vital Signs: BP (!) 153/94   Pulse 64   Temp 97.6 F (36.4 C) (Oral)   Resp 18   SpO2 100%   Physical Exam  Imaging: No results found.  Labs:  CBC: Recent Labs    11/12/18 1031 12/21/18 0930 02/15/19 1015  WBC 6.9 8.5 9.0  HGB 13.7 15.4 15.7  HCT 45.7 49.4 55.8*  PLT 175 193 241    COAGS: Recent Labs    12/21/18 0930 02/15/19 1015  INR 1.0 0.9    BMP: Recent Labs    11/12/18 1031 12/21/18 0930 02/15/19 1015 02/15/19 1139  NA 141 138 138  --   K 3.8 4.7 6.6* 5.2*  CL 110 108 106  --   CO2 24 22 23   --   GLUCOSE 125* 81 72  --   BUN 20 18 21*  --   CALCIUM 8.8* 8.9 9.1  --   CREATININE 0.48* 0.48* 0.67  --   GFRNONAA >60 >60 >60  --   GFRAA >60 >60 >60  --     LIVER FUNCTION TESTS: No results for input(s): BILITOT, AST, ALT, ALKPHOS, PROT, ALBUMIN in the last 8760 hours.  TUMOR MARKERS: No results for input(s): AFPTM, CEA, CA199, CHROMGRNA in the last 8760 hours.  Assessment and Plan:  Occluded supra-pubic catheter  Will proceed with image guided exchange today by Dr. Pascal Lux.  Risks and benefits discussed with the patient including bleeding, infection, damage to adjacent structures, perforation/fistula connection, and sepsis.  All of the patient's questions were answered, patient is agreeable to proceed. Consent signed and in chart.  Thank you for this interesting consult.  I greatly enjoyed meeting [name] and look forward to participating in their care.  A copy of this report was sent to the requesting provider on this date.  Electronically Signed: Murrell Redden, PA-C   05/06/2019, 3:24 PM      I spent a total of  15 Minutes in face to face in clinical consultation, greater than 50% of which was  counseling/coordinating care for exchange supra-pubic catheter.

## 2019-05-06 NOTE — ED Triage Notes (Signed)
Pt reports that since yesterday suprapubic cath isnt draining in bag even after being flushed. Reports urologist couldn't see him today. Reports that wetting diapers. Having some discomfort.

## 2019-05-06 NOTE — ED Notes (Addendum)
Pt given PO fluids, urine flowing in new catheter bag.

## 2019-05-06 NOTE — ED Notes (Signed)
An After Visit Summary was printed and given to the patient. Discharge instructions given and no further questions at this time, pt leaving with mother.

## 2019-05-06 NOTE — ED Provider Notes (Signed)
I discussed the patient's case with our interventional radiologist to facilitate placement of suprapubic catheter.   Carmin Muskrat, MD 05/06/19 [phone]

## 2019-05-14 NOTE — Patient Instructions (Addendum)
DUE TO COVID-19 ONLY ONE VISITOR IS ALLOWED TO COME WITH YOU AND STAY IN THE WAITING ROOM ONLY DURING PRE OP AND PROCEDURE DAY OF SURGERY. THE 1 VISITOR MAY VISIT WITH YOU AFTER SURGERY IN YOUR PRIVATE ROOM DURING VISITING HOURS ONLY!  YOU NEED TO HAVE A COVID 19 TEST ON 05-18-2019 at 1105 am, THIS TEST MUST BE DONE BEFORE SURGERY, COME  [place], [place] , [id number].  ([name]) ONCE YOUR COVID TEST IS COMPLETED, PLEASE BEGIN THE QUARANTINE INSTRUCTIONS AS OUTLINED IN YOUR HANDOUT.                [name]    Your procedure is scheduled on: 05-22-2019   Report to [facility] Main  Entrance   Report to admitting at  930 AM     Call this number if you have problems the morning of surgery [phone]    Remember: Do not eat food or drink liquids :After Midnight. BRUSH YOUR TEETH MORNING OF SURGERY AND RINSE YOUR MOUTH OUT, NO CHEWING GUM CANDY OR MINTS.     Take these medicines the morning of surgery with A SIP OF WATER: metoprolol tartrate, rosuvastatin (crestor)                                 You may not have any metal on your body including hair pins and              piercings  Do not wear jewelry, make-up, lotions, powders or perfumes, deodorant                      Men may shave face and neck.   Do not bring valuables to the hospital. [place].  Contacts, dentures or bridgework may not be worn into surgery.  Leave suitcase in the car. After surgery it may be brought to your room.     Patients discharged the day of surgery will not be allowed to drive home. IF YOU ARE HAVING SURGERY AND GOING HOME THE SAME DAY, YOU MUST HAVE AN ADULT TO DRIVE YOU HOME AND BE WITH YOU FOR 24 HOURS. YOU MAY GO HOME BY TAXI OR UBER OR ORTHERWISE, BUT AN ADULT MUST ACCOMPANY YOU HOME AND STAY WITH YOU FOR 24 HOURS.  Name and phone number of your driver:[name] cell [phone] mother  Special  Instructions: N/A              Please read over the following fact sheets you were given: _____________________________________________________________________             [facility] - Preparing for Surgery Before surgery, you can play an important role.  Because skin is not sterile, your skin needs to be as free of germs as possible.  You can reduce the number of germs on your skin by washing with CHG (chlorahexidine gluconate) soap before surgery.  CHG is an antiseptic cleaner which kills germs and bonds with the skin to continue killing germs even after washing. Please DO NOT use if you have an allergy to CHG or antibacterial soaps.  If your skin becomes reddened/irritated stop using the CHG and inform your nurse when you arrive at Short Stay. Do not shave (including legs and underarms) for at least 48 hours prior  to the first CHG shower.  You may shave your face/neck. Please follow these instructions carefully:  1.  Shower with CHG Soap the night before surgery and the  morning of Surgery.  2.  If you choose to wash your hair, wash your hair first as usual with your  normal  shampoo.  3.  After you shampoo, rinse your hair and body thoroughly to remove the  shampoo.                           4.  Use CHG as you would any other liquid soap.  You can apply chg directly  to the skin and wash                       Gently with a scrungie or clean washcloth.  5.  Apply the CHG Soap to your body ONLY FROM THE NECK DOWN.   Do not use on face/ open                           Wound or open sores. Avoid contact with eyes, ears mouth and genitals (private parts).                       Wash face,  Genitals (private parts) with your normal soap.             6.  Wash thoroughly, paying special attention to the area where your surgery  will be performed.  7.  Thoroughly rinse your body with warm water from the neck down.  8.  DO NOT shower/wash with your normal soap after using and rinsing off  the CHG  Soap.                9.  Pat yourself dry with a clean towel.            10.  Wear clean pajamas.            11.  Place clean sheets on your bed the night of your first shower and do not  sleep with pets. Day of Surgery : Do not apply any lotions/deodorants the morning of surgery.  Please wear clean clothes to the hospital/surgery center.  FAILURE TO FOLLOW THESE INSTRUCTIONS MAY RESULT IN THE CANCELLATION OF YOUR SURGERY PATIENT SIGNATURE_________________________________  NURSE SIGNATURE__________________________________  ________________________________________________________________________

## 2019-05-17 ENCOUNTER — Other Ambulatory Visit: Payer: Self-pay

## 2019-05-17 ENCOUNTER — Encounter (HOSPITAL_COMMUNITY)
Admission: RE | Admit: 2019-05-17 | Discharge: 2019-05-17 | Disposition: A | Discharge status: Home / Self Care | Payer: Medicare Other | Source: Ambulatory Visit | Attending: Urology | Admitting: Urology

## 2019-05-17 ENCOUNTER — Encounter (HOSPITAL_COMMUNITY): Payer: Self-pay

## 2019-05-17 DIAGNOSIS — N312 Flaccid neuropathic bladder, not elsewhere classified: Secondary | ICD-10-CM | POA: Diagnosis not present

## 2019-05-17 DIAGNOSIS — Z01812 Encounter for preprocedural laboratory examination: Secondary | ICD-10-CM | POA: Diagnosis not present

## 2019-05-17 DIAGNOSIS — Z20828 Contact with and (suspected) exposure to other viral communicable diseases: Secondary | ICD-10-CM | POA: Insufficient documentation

## 2019-05-17 LAB — BASIC METABOLIC PANEL
Anion gap: 9 (ref 5–15)
BUN: 12 mg/dL (ref 6–20)
CO2: 27 mmol/L (ref 22–32)
Calcium: 9.3 mg/dL (ref 8.9–10.3)
Chloride: 103 mmol/L (ref 98–111)
Creatinine, Ser: 0.37 mg/dL — ABNORMAL LOW (ref 0.61–1.24)
GFR calc Af Amer: >60 mL/min (ref >60)
GFR calc non Af Amer: >60 mL/min (ref >60)
Glucose, Bld: 101 mg/dL — ABNORMAL HIGH (ref 70–99)
Potassium: 4.2 mmol/L (ref 3.5–5.1)
Sodium: 139 mmol/L (ref 135–145)

## 2019-05-17 LAB — CBC
HCT: 49.5 % (ref 39.0–52.0)
Hemoglobin: 15.2 g/dL (ref 13.0–17.0)
MCH: 25.2 pg — ABNORMAL LOW (ref 26.0–34.0)
MCHC: 30.7 g/dL (ref 30.0–36.0)
MCV: 82 fL (ref 80.0–100.0)
Platelets: 209 10*3/uL (ref 150–400)
RBC: 6.04 MIL/uL — ABNORMAL HIGH (ref 4.22–5.81)
RDW: 14 % (ref 11.5–15.5)
WBC: 8 10*3/uL (ref 4.0–10.5)
nRBC: 0 % (ref 0.0–0.2)

## 2019-05-17 LAB — HEMOGLOBIN A1C
Hgb A1c MFr Bld: 5.3 % (ref 4.8–5.6)
Mean Plasma Glucose: 105.41 mg/dL

## 2019-05-17 NOTE — Progress Notes (Signed)
PCP - dr Myra Rude bland Cardiologist - none  Chest x-ray - none EKG - 11-12-18 epic Stress Test - none ECHO - none Cardiac Cath - none  Sleep Study - none CPAP - none  Fasting Blood Sugar - n/a Checks Blood Sugar _____ times a day  Blood Thinner Instructions:none Aspirin Instructions:n/a Last Dose:  Anesthesia review:   Patient denies shortness of breath, fever, cough and chest pain at PAT appointment   Patient verbalized understanding of instructions that were given to them at the PAT appointment. Patient was also instructed that they will need to review over the PAT instructions again at home before surgery.

## 2019-05-18 ENCOUNTER — Other Ambulatory Visit (HOSPITAL_COMMUNITY)
Admission: RE | Admit: 2019-05-18 | Discharge: 2019-05-18 | Disposition: A | Discharge status: Home / Self Care | Payer: Medicare Other | Source: Ambulatory Visit | Attending: Urology | Admitting: Urology

## 2019-05-18 DIAGNOSIS — Z01812 Encounter for preprocedural laboratory examination: Secondary | ICD-10-CM | POA: Diagnosis not present

## 2019-05-19 LAB — NOVEL CORONAVIRUS, NAA (HOSP ORDER, SEND-OUT TO REF LAB; TAT 18-24 HRS): SARS-CoV-2, NAA: NOT DETECTED

## 2019-05-22 ENCOUNTER — Ambulatory Visit (HOSPITAL_COMMUNITY): Payer: Medicare Other | Admitting: Physician Assistant

## 2019-05-22 ENCOUNTER — Encounter (HOSPITAL_COMMUNITY)
Admission: RE | Disposition: A | Discharge status: Home / Self Care | Payer: Self-pay | Source: Home / Self Care | Attending: Urology

## 2019-05-22 ENCOUNTER — Encounter (HOSPITAL_COMMUNITY): Payer: Self-pay | Admitting: *Deleted

## 2019-05-22 ENCOUNTER — Ambulatory Visit (HOSPITAL_COMMUNITY): Payer: Medicare Other | Admitting: Anesthesiology

## 2019-05-22 ENCOUNTER — Ambulatory Visit (HOSPITAL_COMMUNITY)
Admission: RE | Admit: 2019-05-22 | Discharge: 2019-05-22 | Disposition: A | Discharge status: Home / Self Care | Payer: Medicare Other | Attending: Urology | Admitting: Urology

## 2019-05-22 DIAGNOSIS — Z933 Colostomy status: Secondary | ICD-10-CM | POA: Insufficient documentation

## 2019-05-22 DIAGNOSIS — Z982 Presence of cerebrospinal fluid drainage device: Secondary | ICD-10-CM | POA: Insufficient documentation

## 2019-05-22 DIAGNOSIS — I10 Essential (primary) hypertension: Secondary | ICD-10-CM | POA: Diagnosis not present

## 2019-05-22 DIAGNOSIS — Z79899 Other long term (current) drug therapy: Secondary | ICD-10-CM | POA: Insufficient documentation

## 2019-05-22 DIAGNOSIS — N319 Neuromuscular dysfunction of bladder, unspecified: Secondary | ICD-10-CM | POA: Diagnosis present

## 2019-05-22 DIAGNOSIS — Z881 Allergy status to other antibiotic agents status: Secondary | ICD-10-CM | POA: Insufficient documentation

## 2019-05-22 DIAGNOSIS — Q0703 Arnold-Chiari syndrome with spina bifida and hydrocephalus: Secondary | ICD-10-CM | POA: Insufficient documentation

## 2019-05-22 DIAGNOSIS — N3281 Overactive bladder: Secondary | ICD-10-CM | POA: Diagnosis not present

## 2019-05-22 HISTORY — PX: BOTOX INJECTION: SHX5754

## 2019-05-22 SURGERY — BOTOX INJECTION
Anesthesia: Monitor Anesthesia Care

## 2019-05-22 MED ORDER — SODIUM CHLORIDE (PF) 0.9 % IJ SOLN
INTRAMUSCULAR | Status: AC
  Filled 2019-05-22: qty 30

## 2019-05-22 MED ORDER — ONABOTULINUMTOXINA 100 UNITS IJ SOLR
100.0000 [IU] | Freq: Once | INTRAMUSCULAR | Status: AC
  Administered 2019-05-22: 100 [IU] via INTRAMUSCULAR
  Filled 2019-05-22: qty 100

## 2019-05-22 MED ORDER — PROMETHAZINE HCL 25 MG/ML IJ SOLN: 6.2500 mg | INTRAMUSCULAR | Status: DC | PRN

## 2019-05-22 MED ORDER — CEFAZOLIN SODIUM-DEXTROSE 2-4 GM/100ML-% IV SOLN
2.0000 g | INTRAVENOUS | Status: AC
  Administered 2019-05-22: 2 g via INTRAVENOUS
  Filled 2019-05-22: qty 100

## 2019-05-22 MED ORDER — LACTATED RINGERS IV SOLN
INTRAVENOUS | Status: DC
  Administered 2019-05-22: 10:00:00 via INTRAVENOUS

## 2019-05-22 MED ORDER — 0.9 % SODIUM CHLORIDE (POUR BTL) OPTIME
TOPICAL | Status: DC | PRN
  Administered 2019-05-22: 13:00:00 1000 mL

## 2019-05-22 MED ORDER — PROPOFOL 500 MG/50ML IV EMUL
INTRAVENOUS | Status: DC | PRN
  Administered 2019-05-22: 50 ug/kg/min via INTRAVENOUS

## 2019-05-22 MED ORDER — HYDROMORPHONE HCL 1 MG/ML IJ SOLN: 0.2500 mg | INTRAMUSCULAR | Status: DC | PRN

## 2019-05-22 MED ORDER — MIDAZOLAM HCL 2 MG/2ML IJ SOLN
INTRAMUSCULAR | Status: AC
  Filled 2019-05-22: qty 2

## 2019-05-22 MED ORDER — AMOXICILLIN-POT CLAVULANATE 875-125 MG PO TABS: 1.0000 | ORAL_TABLET | Freq: Two times a day (BID) | ORAL | 0 refills | Status: AC

## 2019-05-22 MED ORDER — ONABOTULINUMTOXINA 100 UNITS IJ SOLR
100.0000 [IU] | Freq: Once | INTRAMUSCULAR | Status: DC
  Filled 2019-05-22: qty 100

## 2019-05-22 MED ORDER — PROPOFOL 10 MG/ML IV BOLUS
INTRAVENOUS | Status: AC
  Filled 2019-05-22: qty 40

## 2019-05-22 MED ORDER — MIDAZOLAM HCL 5 MG/5ML IJ SOLN
INTRAMUSCULAR | Status: DC | PRN
  Administered 2019-05-22: 2 mg via INTRAVENOUS

## 2019-05-22 MED ORDER — LIDOCAINE HCL URETHRAL/MUCOSAL 2 % EX GEL
CUTANEOUS | Status: AC
  Filled 2019-05-22: qty 5

## 2019-05-22 MED ORDER — ONDANSETRON HCL 4 MG/2ML IJ SOLN
INTRAMUSCULAR | Status: DC | PRN
  Administered 2019-05-22: 4 mg via INTRAVENOUS

## 2019-05-22 MED ORDER — METOPROLOL TARTRATE 50 MG PO TABS
50.0000 mg | ORAL_TABLET | Freq: Once | ORAL | Status: AC
  Administered 2019-05-22: 50 mg via ORAL
  Filled 2019-05-22: qty 1

## 2019-05-22 MED ORDER — FENTANYL CITRATE (PF) 100 MCG/2ML IJ SOLN
INTRAMUSCULAR | Status: AC
  Filled 2019-05-22: qty 2

## 2019-05-22 MED ORDER — ONDANSETRON HCL 4 MG/2ML IJ SOLN
INTRAMUSCULAR | Status: AC
  Filled 2019-05-22: qty 2

## 2019-05-22 MED ORDER — SODIUM CHLORIDE 0.9 % IJ SOLN
INTRAMUSCULAR | Status: DC | PRN
  Administered 2019-05-22: 20 mL

## 2019-05-22 MED ORDER — ACETAMINOPHEN 500 MG PO TABS
1000.0000 mg | ORAL_TABLET | Freq: Once | ORAL | Status: AC
  Administered 2019-05-22: 1000 mg via ORAL
  Filled 2019-05-22: qty 2

## 2019-05-22 MED ORDER — LIDOCAINE 2% (20 MG/ML) 5 ML SYRINGE
INTRAMUSCULAR | Status: AC
  Filled 2019-05-22: qty 5

## 2019-05-22 MED ORDER — ONABOTULINUMTOXINA 100 UNITS IJ SOLR
100.0000 [IU] | Freq: Once | INTRAMUSCULAR | Status: AC
  Administered 2019-05-22: 13:00:00 100 [IU] via INTRAMUSCULAR
  Filled 2019-05-22: qty 100

## 2019-05-22 MED ORDER — STERILE WATER FOR INJECTION IJ SOLN
INTRAMUSCULAR | Status: AC
  Filled 2019-05-22: qty 30

## 2019-05-22 SURGICAL SUPPLY — 13 items
CATH SILICON 18FR 30CC (CATHETERS) ×2 IMPLANT
CLOTH BEACON ORANGE TIMEOUT ST (SAFETY) ×3 IMPLANT
GOWN STRL REUS W/TWL XL LVL3 (GOWN DISPOSABLE) ×6 IMPLANT
KIT TURNOVER KIT A (KITS) ×2 IMPLANT
NDL ASPIRATION 22 (NEEDLE) ×1 IMPLANT
NDL SAFETY ECLIPSE 18X1.5 (NEEDLE) ×1 IMPLANT
NEEDLE ASPIRATION 22 (NEEDLE) ×3 IMPLANT
NEEDLE HYPO 18GX1.5 SHARP (NEEDLE)
PACK CYSTO (CUSTOM PROCEDURE TRAY) ×3 IMPLANT
SYR 30ML LL (SYRINGE) ×3 IMPLANT
SYR BULB IRRIGATION 50ML (SYRINGE) ×3 IMPLANT
SYR CONTROL 10ML LL (SYRINGE) ×3 IMPLANT
TUBE CONNECTING VINYL 14FR 30C (TUBING) ×3 IMPLANT

## 2019-05-22 NOTE — Anesthesia Postprocedure Evaluation (Signed)
Anesthesia Post Note  Patient: [name]  Procedure(s) Performed: BOTOX INJECTION (N/A )     Patient location during evaluation: PACU Anesthesia Type: MAC Level of consciousness: awake and alert Pain management: pain level controlled Vital Signs Assessment: post-procedure vital signs reviewed and stable Respiratory status: spontaneous breathing and respiratory function stable Cardiovascular status: stable Postop Assessment: no apparent nausea or vomiting Anesthetic complications: no    Last Vitals:  Vitals:   05/22/19 0927 05/22/19 1246  BP: 129/68 (!) 107/56  Pulse: 62 72  Resp: 18 13  Temp: 36.8 C 36.7 C  SpO2: 100% 100%    Last Pain:  Vitals:   05/22/19 1246  TempSrc:   PainSc: 0-No pain                 Marieme Mcmackin DANIEL

## 2019-05-22 NOTE — Progress Notes (Signed)
Unable to fit SCDs to Pt legs, Crna Arlee Muslim and Dr Gloriann Loan notified.

## 2019-05-22 NOTE — H&P (Signed)
CC/HPI: CC: Neurogenic bladder, history of spina bifida  HPI:  38 year old male previously followed by Dr. Risa Grill. He was originally referred for recurrent urinary tract infections in the setting of a history of spina bifida. He has been followed for a few years here. I do not see a recent urodynamics. He has a colostomy. His mother accompanies him. She denies any history of bladder surgery such as bladder augmentation or vesicostomy. When he was younger, he performed clean intermittent catheterization but has not done this for quite some time. His mother states he just dribbles into his diaper.   10/18/2018  Patient underwent a urodynamics. This revealed a very hostile bladder. There was a loss of compliance that was quite significant. He had unstable neurogenic detrusor overactivity. He had a high pressure system.he is highly against having a catheter or suprapubic tube placed. His providers with him. She states she is able to catheterize him 4-6 times daily. He has good hand function but he does not think that he can catheterize himself.   11/16/18:  This patient is two days s/p intravesical botox. His mother called our after hours line last night reporting a fever of 102 F, occurring the prior day. She gave him Tylenol, and then last night it decreased to 100 F prior to our phone conversation. She was also concerned regarding gross hematuria s/p Botox. He is currently taking Bactrim DS b.i.d x 1 week.   Today, his mother states he became afebrile with a temp of 68 F last night after our phone conversation without additional tylenol. He is currently afebrile and feels well. He has a foley catheter in place draining mild gross hematuria.   11/26/18:  This patient is now 12 days status post intravesical Botox therapy. His mother reports that he has remained afebrile and feeling well. She removed his Foley catheter approximately 10 days ago. Her only concern is that she is now having difficulty with straight  cathing the patient, which she reports she was instructed to do q four hours. He is incontinent and wears a diaper.   12/14/18: He returns today for follow up. He is s/p Botox on 3/4. It was recommended that he also perform CIC 4-6 times daily. Overall, he is doing fair since his procedure. However, his mother has been having issues with performing CIC successfully. Unfortunately, she has only be able to perform CIC x 3-4 times since being seen on 3/16. She states that she continues to meet resistance and does not feel she is able to pass the catheter all the way into the bladder. She is able to get some urine out when she attempts catheterization, but only a few ounces. He denies pain associated with CIC, but he has minimal sensation. He denies fevers or chills. No hematuria. He states that his urine has been cloudy. Culture on 3/16 was positive for E. Coli and he was treated with Macrobid.    01/21/19: He is s/p IR SP catheter placement on 4/10 with 12 Fr. catheter. Per impression the procedure was very difficult due to large abdominal pannus, which contained multiple loops of bowel. Even with retraction of the pannus, the bladder was very deep into the skin. The patient's body habitus was felt to make exchange challenging and put him at risk for spontaneous tube retraction out of the bladder lumen. He presents today with his mother. Per her report, he has had poor drainage from the catheter beginning this morning. However, his mother exchanged bag bedside bag to  leg bag and urine output improved. Currently his catheter is draining well with approximatly 600 cc of yellow colored urine in his drainage bag. No reports of hematuria, fevers, or chills. He does have minimal amount of leakage from his urethra, but not bothersome at this time.   02/01/2019  Patient status post suprapubic tube placement on 12/21/2018. This was technically challenging by interventional radiology. Given his body habitus as well as  multiple abdominal surgeries. He is scheduled for a upsizing of the catheter on 02/15/2019. It has been draining well. His mom is flushing it as needed.    03/21/19:  Patient returns today for previously scheduled suprapubic tube exchange. Unfortunately SP tube came out unexpectedly on 03/01/2019 and had to be replaced by interventional radiology. They placed a 16 [place] G-tube. Ceasar Mons has to be intermittently deglogged. No infections.   SP tube was flushed today somewhat forcefully in order to dislodge debris. Now draining well. Given that tube was just placed 2 weeks ago we will plan to see him back in 4 weeks with a suprapubic tube exchange over wire. Until then mother given instructions and supplies to flush catheter daily with 30 cc of sterile fluid.   04/18/2019  Patient presents for suprapubic tube exchange. He is now 5 months out from Botox. He is having increase leakage from the penis.     ALLERGIES: Cipro TABS Contrast Media Ready-Box MISC Latex Gloves MISC Vancomycin HCl SOLR    MEDICATIONS: Calcium + D  Cranberry Fruit  Klor-Con 20 meq packet  Metoprolol Tartrate 50 mg tablet Oral  Multi-Day Vitamins tablet Oral  Rosuvastatin Calcium 20 mg tablet  Tylenol Arthritis PRN  Verapamil Hcl 1 PO Daily  Zofran 4 mg tablet Oral     GU PSH: Catheterization For Collection Of Specimen, Single Patient, All Places Of Service - 11/26/2018, 08/08/2018, 12/27/2017 Catheterize For Residual - 08/08/2018, Dec 08, 2015 Complex cystometrogram, w/ void pressure and urethral pressure profile studies, any technique - 10/11/2018 Complex Uroflow - 10/11/2018 Emg surf Electrd - 10/11/2018 Inject For cystogram - 10/11/2018 Intrabd voidng Press - 10/11/2018       PSH Notes: Ileostomy, Colostomy, Cholecystectomy, Appendectomy   NON-GU PSH: Appendectomy - 08-Dec-2014 Cholecystectomy (open) - 12-08-14 Colostomy - 2014-12-08     GU PMH: Bladder, Oth neuromuscular dysfunction - 11/26/2018 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,  - 10/18/2018 Incomplete bladder emptying - 10/11/2018 Incontinence w/o Sensation - 09/19/2018 Candidal balanitis - 11/08/2017 Oth urogenital candidiasis - 11/08/2017 Obstructive and reflux uropathy, Unspec - 2017-12-07 Bladder, Neuromuscular dysfunction, Unspec, Neurogenic bladder - 12/08/14 Chronic cystitis (w/o hematuria), Chronic cystitis - Dec 08, 2014 Urge incontinence, Urge incontinence of urine - 2014/12/08    NON-GU PMH: Spina bifida, unspecified - 09/19/2018 Encounter for general adult medical examination without abnormal findings, Encounter for preventive health examination - 2014/12/08 Personal history of other diseases of the circulatory system, History of hypertension - 12/08/2014 Personal history of other diseases of the nervous system and sense organs, History of hydrocephalus - 08-Dec-2014 Personal history of other specified (corrected) congenital malformations of nervous system and sense organs, History of spina bifida - 12-08-2014 Personal history of other specified conditions, History of heartburn - 12-08-2014    FAMILY HISTORY: Death - Father Diabetes - Runs In Family Hypertension - Runs In Family Tuberculosis - Runs In Family   SOCIAL HISTORY: Marital Status: Single Preferred Language: English; Ethnicity: Not Hispanic Or Latino; Race: White Current Smoking Status: Patient has never smoked.  Drinks 1 caffeinated drink per day.     Notes: Caffeine  use, Never smoker, Alcohol use, Disabled, Single, Number of children, Never used tobacco   REVIEW OF SYSTEMS:    GU Review Male:   Patient denies frequent urination, hard to postpone urination, burning/ pain with urination, get up at night to urinate, leakage of urine, stream starts and stops, trouble starting your stream, have to strain to urinate , erection problems, and penile pain.  Gastrointestinal (Upper):   Patient denies nausea, vomiting, and indigestion/ heartburn.  Gastrointestinal (Lower):   Patient denies diarrhea and constipation.  Constitutional:   Patient denies  fever, night sweats, weight loss, and fatigue.  Skin:   Patient denies skin rash/ lesion and itching.  Eyes:   Patient denies blurred vision and double vision.  Ears/ Nose/ Throat:   Patient denies sore throat and sinus problems.  Hematologic/Lymphatic:   Patient denies swollen glands and easy bruising.  Cardiovascular:   Patient denies leg swelling and chest pains.  Respiratory:   Patient denies cough and shortness of breath.  Endocrine:   Patient denies excessive thirst.  Musculoskeletal:   Patient denies back pain and joint pain.  Neurological:   Patient denies headaches and dizziness.  Psychologic:   Patient denies depression and anxiety.   Notes: 1 month follow-up    VITAL SIGNS:      04/18/2019 08:36 AM  Height 60 in / 152.4 cm  BP 119/78 mmHg  Pulse 83 /min  Temperature 98.2 F / 36.7 C   MULTI-SYSTEM PHYSICAL EXAMINATION:    Constitutional: Moderate physical deformities. Well-nourished. Normally developed. Good grooming.   Respiratory: No labored breathing, no use of accessory muscles.   Cardiovascular: Normal temperature, adequate perfusion of extremities  Skin: No paleness, no jaundice  Neurologic / Psychiatric: Oriented to time, oriented to place, oriented to person. No depression, no anxiety, no agitation.  Gastrointestinal: Midline scar. Colostomy/ileostomy present. No mass, no tenderness, no rigidity, non obese abdomen. 16 Fr. SP catheter appears to remain sutured in place and draining clear, yellow colored urine well.   Eyes: Normal conjunctivae. Normal eyelids.  Musculoskeletal: Uses a wheelchair. Has spina bifida.     PAST DATA REVIEWED:  Source Of History:  Patient   PROCEDURES:         Complicated Change SP Tube - [id number]  The patient's indwelling SP tube was carefully removed. A ___16___ [place] Foley catheter was inserted into the bladder using sterile technique. The patient was taught routine catheter care.   ASSESSMENT:      ICD-10 Details  1 GU:    Bladder, Neuromuscular dysfunction, Unspec - N31.9    PLAN:           Document Letter(s):  Created for Patient: Clinical Summary         Notes:   Repeat Botox in 1 month.    Signed by Link Snuffer, III, M.D. on 04/18/19 at 9:09 AM (EDT

## 2019-05-22 NOTE — Anesthesia Preprocedure Evaluation (Addendum)
Anesthesia Evaluation  Patient identified by MRN, date of birth, ID band Patient awake    Reviewed: Allergy & Precautions, H&P , NPO status , Patient's Chart, lab work & pertinent test results, reviewed documented beta blocker date and time   Airway Mallampati: II  TM Distance: >3 FB Neck ROM: full    Dental no notable dental hx. (+) Poor Dentition   Pulmonary neg pulmonary ROS,    Pulmonary exam normal breath sounds clear to auscultation       Cardiovascular Exercise Tolerance: Good hypertension, Pt. on medications and Pt. 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  Arnold-Chiari malformation    Neurogenic bladder   INCONTINENT -  Congenital paraplegia (HCC)     negative psych ROS   GI/Hepatic Neg liver ROS, GERD  Medicated and Controlled,  Endo/Other  negative endocrine ROS  Renal/GU Renal disease  negative genitourinary   Musculoskeletal   Abdominal   Peds  Hematology  (+) Blood dyscrasia, anemia ,   Anesthesia Other Findings   Reproductive/Obstetrics negative OB ROS                                                              Anesthesia Evaluation  Patient identified by MRN, date of birth, ID band Patient awake    Reviewed: Allergy & Precautions, H&P , NPO status , Patient's Chart, lab work & pertinent test results  Airway Mallampati: III TM Distance: >3 FB Neck ROM: Limited    Dental  (+) Teeth Intact, Poor Dentition, Dental Advisory Given   Pulmonary neg pulmonary ROS,  breath sounds clear to auscultation  + decreased breath sounds      Cardiovascular hypertension, Pt. 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 negative psych ROS   GI/Hepatic negative GI ROS, Neg liver ROS, GERD-  ,  Endo/Other  negative endocrine ROS  Renal/GU negative Renal ROS  negative genitourinary   Musculoskeletal negative musculoskeletal ROS (+)   Abdominal   Peds  Hematology negative hematology ROS (+) anemia ,   Anesthesia Other Findings Diminished inspiratory effort secondary to RUQ pain  Reproductive/Obstetrics                           Anesthesia Physical Anesthesia Plan  ASA: III  Anesthesia Plan: General   Post-op Pain Management:    Induction: Intravenous  Airway Management Planned: LMA  Additional Equipment:   Intra-op Plan:   Post-operative Plan: Extubation in OR  Informed Consent: I have reviewed the patients History and Physical, chart, labs and discussed the procedure including the risks, benefits and alternatives for the proposed anesthesia with the patient or authorized representative who has indicated his/her understanding and acceptance.   Dental advisory given  Plan Discussed with: CRNA  Anesthesia Plan Comments:         Anesthesia Quick Evaluation  Anesthesia Evaluation  Patient identified by MRN, date of birth, ID band Patient awake    Reviewed: Allergy & Precautions, H&P , NPO status , Patient's Chart, lab work & pertinent test results  Airway Mallampati: II TM Distance: >3 FB Neck ROM: Full   Comment: Intubated earlier today. Dental no notable dental hx.    Pulmonary neg pulmonary ROS,  breath sounds clear to auscultation  Pulmonary exam normal       Cardiovascular hypertension, Pt. on medications Rhythm:Regular Rate:Normal     Neuro/Psych negative neurological ROS  negative psych ROS   GI/Hepatic Neg liver ROS, GERD-  Medicated,  Endo/Other  negative endocrine ROS  Renal/GU negative Renal ROS   negative genitourinary   Musculoskeletal negative musculoskeletal ROS (+)   Abdominal (+) + obese,   Peds negative pediatric ROS (+)  Hematology negative hematology ROS (+)   Anesthesia Other Findings   Reproductive/Obstetrics negative OB ROS                           Anesthesia Physical Anesthesia Plan  ASA: IV and emergent  Anesthesia Plan: General   Post-op Pain Management:    Induction: Intravenous  Airway Management Planned: Oral ETT  Additional Equipment:   Intra-op Plan:   Post-operative Plan: Post-operative intubation/ventilation  Informed Consent: I have reviewed the patients History and Physical, chart, labs and discussed the procedure including the risks, benefits and alternatives for the proposed anesthesia with the patient or authorized representative who has indicated his/her understanding and acceptance.     Plan Discussed with: CRNA  Anesthesia Plan Comments: (Intubated in ICU. Low blood pressure. CVC in place. Na 125. Hgb 10.1. Elevated LFTs. Levophed at 35 mcg/min. Vent. Settings: TV 450, FiO2 0.7 PEEP 5 RR 16. Last blood pressure 83/48. HR 125. O2Sat 100%.  Discussed critical condition with patient's mother.  Will attempt arterial line placement.)      Anesthesia Quick Evaluation   Anesthesia Physical  Anesthesia Plan  ASA: III  Anesthesia Plan: MAC   Post-op Pain Management:    Induction: Intravenous  PONV Risk Score and Plan: 2 and Ondansetron, Treatment may vary due to age or medical condition and Propofol infusion  Airway Management Planned: Nasal Cannula, Simple Face Mask and Natural Airway  Additional Equipment:   Intra-op Plan:   Post-operative Plan:   Informed Consent: I have reviewed the patients History and Physical, chart, labs and discussed the procedure including the risks, benefits and alternatives for the proposed anesthesia with the patient or authorized representative who has  indicated his/her understanding and acceptance.     Dental Advisory Given  Plan Discussed with: CRNA, Anesthesiologist and Surgeon  Anesthesia Plan Comments:       Anesthesia Quick Evaluation

## 2019-05-22 NOTE — Transfer of Care (Signed)
Immediate Anesthesia Transfer of Care Note  Patient: [name]  Procedure(s) Performed: BOTOX INJECTION (N/A )  Patient Location: PACU  Anesthesia Type:MAC  Level of Consciousness: awake, alert  and oriented  Airway & Oxygen Therapy: Patient Spontanous Breathing and Patient connected to face mask oxygen  Post-op Assessment: Report given to RN and Post -op Vital signs reviewed and stable  Post vital signs: Reviewed and stable  Last Vitals:  Vitals Value Taken Time  BP 107/56 05/22/19 1246  Temp    Pulse 67 05/22/19 1247  Resp 16 05/22/19 1247  SpO2 100 % 05/22/19 1247  Vitals shown include unvalidated device data.  Last Pain:  Vitals:   05/22/19 1000  TempSrc:   PainSc: 0-No pain      Patients Stated Pain Goal: (unable) (99/83/38 2505)  Complications: No apparent anesthesia complications

## 2019-05-22 NOTE — Anesthesia Procedure Notes (Signed)
Procedure Name: MAC Date/Time: 05/22/2019 12:10 PM Performed by: Maxwell Caul, CRNA Pre-anesthesia Checklist: Patient identified, Emergency Drugs available, Suction available, Patient being monitored and Timeout performed Oxygen Delivery Method: Simple face mask

## 2019-05-23 ENCOUNTER — Encounter (HOSPITAL_COMMUNITY): Payer: Self-pay | Admitting: Urology

## 2019-05-24 NOTE — Op Note (Signed)
Operative Note  Preoperative diagnosis:  1. Neurogenic bladder with detrusor overactivity  Postoperative diagnosis: 1.  same  Procedure(s): 1.  Cystoscopy with 200 unit intravesical Botox injection  2.  Suprapubic tube exchange  Surgeon: Link Snuffer, MD  Assistants: none  Anesthesia:   mac  Complications:  None immediate  EBL:  minimal  Specimens: 1.   none  Drains/Catheters: 1.  18 French silicone catheter  Intraoperative findings:  1.  Normal anterior urethra 2.  Contracted and small capacity bladder  Indication:  38 year old male with neurogenic bladder and detrusor overactivity currently managing his bladder with suprapubic tube and intravesical Botox  Description of procedure:  The patient was identified and consent was obtained.  The patient was taken to the operating room and placed in the supine position.  The patient was placed under  mac anesthesia.  Perioperative antibiotics were administered.  The patient was placed in dorsal lithotomy.  Patient was prepped and draped in a standard sterile fashion and a timeout was performed.   a 7 [place] scope was advanced into the urethra and into the bladder.  The suprapubic tube was removed.  I then proceeded to inject 200 units of Botox intravesically throughout the bladder by standard template.  I then exchanged the suprapubic catheter for an 46 French silicone catheter.  This concluded the operation.  Patient tolerated the procedure well  Plan: follow-up in several months for planning for repeat Botox.  Continue monthly catheter exchanges.

## 2019-10-10 ENCOUNTER — Other Ambulatory Visit: Payer: Self-pay | Admitting: Urology

## 2019-11-04 NOTE — Patient Instructions (Addendum)
DUE TO COVID-19 ONLY ONE VISITOR IS ALLOWED TO COME WITH YOU AND STAY IN THE WAITING ROOM ONLY DURING PRE OP AND PROCEDURE DAY OF SURGERY. THE 1 VISITOR MAY VISIT WITH YOU AFTER SURGERY IN YOUR PRIVATE ROOM DURING VISITING HOURS ONLY!  YOU NEED TO HAVE A COVID 19 TEST ON: 11/09/19 @ 11:00 AM  THIS TEST MUST BE DONE BEFORE SURGERY, COME  [place], [place] , [id number].  ([place]) ONCE YOUR COVID TEST IS COMPLETED, PLEASE BEGIN THE QUARANTINE INSTRUCTIONS AS OUTLINED IN YOUR HANDOUT.                Leodis Sias    Your procedure is scheduled on: 11/13/19   Report to [facility] Main  Entrance   Report to admitting at: 7:45  AM     Call this number if you have problems the morning of surgery [phone]    Remember: Do not eat food or drink liquids :After Midnight.   BRUSH YOUR TEETH MORNING OF SURGERY AND RINSE YOUR MOUTH OUT, NO CHEWING GUM CANDY OR MINTS.     Take these medicines the morning of surgery with A SIP OF WATER: metoprolol,crestor,oxybutrin                                 You may not have any metal on your body including hair pins and              piercings  Do not wear jewelry, lotions, powders or perfumes, deodorant             Men may shave face and neck.   Do not bring valuables to the hospital. [place].  Contacts, dentures or bridgework may not be worn into surgery.  Leave suitcase in the car. After surgery it may be brought to your room.     Patients discharged the day of surgery will not be allowed to drive home. IF YOU ARE HAVING SURGERY AND GOING HOME THE SAME DAY, YOU MUST HAVE AN ADULT TO DRIVE YOU HOME AND BE WITH YOU FOR 24 HOURS. YOU MAY GO HOME BY TAXI OR UBER OR ORTHERWISE, BUT AN ADULT MUST ACCOMPANY YOU HOME AND STAY WITH YOU FOR 24 HOURS.  Name and phone number of your driver:  Special Instructions: N/A              Please read over the following fact sheets  you were given: _____________________________________________________________________             [facility] - Preparing for Surgery Before surgery, you can play an important role.  Because skin is not sterile, your skin needs to be as free of germs as possible.  You can reduce the number of germs on your skin by washing with CHG (chlorahexidine gluconate) soap before surgery.  CHG is an antiseptic cleaner which kills germs and bonds with the skin to continue killing germs even after washing. Please DO NOT use if you have an allergy to CHG or antibacterial soaps.  If your skin becomes reddened/irritated stop using the CHG and inform your nurse when you arrive at Short Stay. Do not shave (including legs and underarms) for at least 48 hours prior to the first CHG shower.  You may shave your face/neck. Please follow these  instructions carefully:  1.  Shower with CHG Soap the night before surgery and the  morning of Surgery.  2.  If you choose to wash your hair, wash your hair first as usual with your  normal  shampoo.  3.  After you shampoo, rinse your hair and body thoroughly to remove the  shampoo.                           4.  Use CHG as you would any other liquid soap.  You can apply chg directly  to the skin and wash                       Gently with a scrungie or clean washcloth.  5.  Apply the CHG Soap to your body ONLY FROM THE NECK DOWN.   Do not use on face/ open                           Wound or open sores. Avoid contact with eyes, ears mouth and genitals (private parts).                       Wash face,  Genitals (private parts) with your normal soap.             6.  Wash thoroughly, paying special attention to the area where your surgery  will be performed.  7.  Thoroughly rinse your body with warm water from the neck down.  8.  DO NOT shower/wash with your normal soap after using and rinsing off  the CHG Soap.                9.  Pat yourself dry with a clean towel.            10.   Wear clean pajamas.            11.  Place clean sheets on your bed the night of your first shower and do not  sleep with pets. Day of Surgery : Do not apply any lotions/deodorants the morning of surgery.  Please wear clean clothes to the hospital/surgery center.  FAILURE TO FOLLOW THESE INSTRUCTIONS MAY RESULT IN THE CANCELLATION OF YOUR SURGERY PATIENT SIGNATURE_________________________________  NURSE SIGNATURE__________________________________  ________________________________________________________________________

## 2019-11-05 ENCOUNTER — Other Ambulatory Visit: Payer: Self-pay

## 2019-11-05 ENCOUNTER — Encounter (HOSPITAL_COMMUNITY)
Admission: RE | Admit: 2019-11-05 | Discharge: 2019-11-05 | Disposition: A | Discharge status: Home / Self Care | Payer: Medicare Other | Source: Ambulatory Visit | Attending: Urology | Admitting: Urology

## 2019-11-05 ENCOUNTER — Encounter (HOSPITAL_COMMUNITY): Payer: Self-pay

## 2019-11-05 DIAGNOSIS — Z01818 Encounter for other preprocedural examination: Secondary | ICD-10-CM | POA: Diagnosis not present

## 2019-11-05 DIAGNOSIS — Z01812 Encounter for preprocedural laboratory examination: Secondary | ICD-10-CM | POA: Insufficient documentation

## 2019-11-05 LAB — BASIC METABOLIC PANEL
Anion gap: 11 (ref 5–15)
BUN: 14 mg/dL (ref 6–20)
CO2: 32 mmol/L (ref 22–32)
Calcium: 9.2 mg/dL (ref 8.9–10.3)
Chloride: 99 mmol/L (ref 98–111)
Creatinine, Ser: 0.48 mg/dL — ABNORMAL LOW (ref 0.61–1.24)
GFR calc Af Amer: >60 mL/min (ref >60)
GFR calc non Af Amer: >60 mL/min (ref >60)
Glucose, Bld: 102 mg/dL — ABNORMAL HIGH (ref 70–99)
Potassium: 3.6 mmol/L (ref 3.5–5.1)
Sodium: 142 mmol/L (ref 135–145)

## 2019-11-05 LAB — CBC
HCT: 41.5 % (ref 39.0–52.0)
Hemoglobin: 13 g/dL (ref 13.0–17.0)
MCH: 25.2 pg — ABNORMAL LOW (ref 26.0–34.0)
MCHC: 31.3 g/dL (ref 30.0–36.0)
MCV: 80.6 fL (ref 80.0–100.0)
Platelets: 236 10*3/uL (ref 150–400)
RBC: 5.15 MIL/uL (ref 4.22–5.81)
RDW: 13.6 % (ref 11.5–15.5)
WBC: 6.7 10*3/uL (ref 4.0–10.5)
nRBC: 0 % (ref 0.0–0.2)

## 2019-11-05 NOTE — Progress Notes (Signed)
PCP - Dr. Jetty Duhamel Cardiologist -   Chest x-ray -  EKG - 11/12/18. EPIC Stress Test -  ECHO -  Cardiac Cath -   Sleep Study -  CPAP -   Fasting Blood Sugar -  Checks Blood Sugar _____ times a day  Blood Thinner Instructions: Aspirin Instructions: Last Dose:  Anesthesia review:   Patient denies shortness of breath, fever, cough and chest pain at PAT appointment   Patient verbalized understanding of instructions that were given to them at the PAT appointment. Patient was also instructed that they will need to review over the PAT instructions again at home before surgery.

## 2019-11-09 ENCOUNTER — Other Ambulatory Visit (HOSPITAL_COMMUNITY)
Admission: RE | Admit: 2019-11-09 | Discharge: 2019-11-09 | Disposition: A | Discharge status: Home / Self Care | Payer: Medicare Other | Source: Ambulatory Visit | Attending: Urology | Admitting: Urology

## 2019-11-09 DIAGNOSIS — Z01812 Encounter for preprocedural laboratory examination: Secondary | ICD-10-CM | POA: Diagnosis present

## 2019-11-09 DIAGNOSIS — Z20822 Contact with and (suspected) exposure to covid-19: Secondary | ICD-10-CM | POA: Diagnosis not present

## 2019-11-09 LAB — SARS CORONAVIRUS 2 (TAT 6-24 HRS): SARS Coronavirus 2: NEGATIVE

## 2019-11-12 NOTE — Progress Notes (Signed)
RN notified [name] mother about the surgery schedule changes.They are aware that they need to be at admissions tomorrow at 8:30 am., instead of 7:45 am.

## 2019-11-13 ENCOUNTER — Ambulatory Visit (HOSPITAL_COMMUNITY): Payer: Medicare Other | Admitting: Certified Registered"

## 2019-11-13 ENCOUNTER — Encounter (HOSPITAL_COMMUNITY): Payer: Self-pay | Admitting: Urology

## 2019-11-13 ENCOUNTER — Encounter (HOSPITAL_COMMUNITY)
Admission: RE | Disposition: A | Discharge status: Home / Self Care | Payer: Self-pay | Source: Home / Self Care | Attending: Urology

## 2019-11-13 ENCOUNTER — Ambulatory Visit (HOSPITAL_COMMUNITY): Payer: Medicare Other | Admitting: Physician Assistant

## 2019-11-13 ENCOUNTER — Ambulatory Visit (HOSPITAL_COMMUNITY)
Admission: RE | Admit: 2019-11-13 | Discharge: 2019-11-13 | Disposition: A | Discharge status: Home / Self Care | Payer: Medicare Other | Attending: Urology | Admitting: Urology

## 2019-11-13 DIAGNOSIS — Z91041 Radiographic dye allergy status: Secondary | ICD-10-CM | POA: Diagnosis not present

## 2019-11-13 DIAGNOSIS — I10 Essential (primary) hypertension: Secondary | ICD-10-CM | POA: Diagnosis not present

## 2019-11-13 DIAGNOSIS — R519 Headache, unspecified: Secondary | ICD-10-CM | POA: Diagnosis not present

## 2019-11-13 DIAGNOSIS — K219 Gastro-esophageal reflux disease without esophagitis: Secondary | ICD-10-CM | POA: Insufficient documentation

## 2019-11-13 DIAGNOSIS — Z9049 Acquired absence of other specified parts of digestive tract: Secondary | ICD-10-CM | POA: Insufficient documentation

## 2019-11-13 DIAGNOSIS — E669 Obesity, unspecified: Secondary | ICD-10-CM | POA: Insufficient documentation

## 2019-11-13 DIAGNOSIS — Q054 Unspecified spina bifida with hydrocephalus: Secondary | ICD-10-CM | POA: Insufficient documentation

## 2019-11-13 DIAGNOSIS — Z933 Colostomy status: Secondary | ICD-10-CM | POA: Diagnosis not present

## 2019-11-13 DIAGNOSIS — D649 Anemia, unspecified: Secondary | ICD-10-CM | POA: Diagnosis not present

## 2019-11-13 DIAGNOSIS — Z87440 Personal history of urinary (tract) infections: Secondary | ICD-10-CM | POA: Diagnosis not present

## 2019-11-13 DIAGNOSIS — Z836 Family history of other diseases of the respiratory system: Secondary | ICD-10-CM | POA: Diagnosis not present

## 2019-11-13 DIAGNOSIS — Z6831 Body mass index (BMI) 31.0-31.9, adult: Secondary | ICD-10-CM | POA: Insufficient documentation

## 2019-11-13 DIAGNOSIS — E65 Localized adiposity: Secondary | ICD-10-CM | POA: Diagnosis not present

## 2019-11-13 DIAGNOSIS — Z8249 Family history of ischemic heart disease and other diseases of the circulatory system: Secondary | ICD-10-CM | POA: Insufficient documentation

## 2019-11-13 DIAGNOSIS — N3941 Urge incontinence: Secondary | ICD-10-CM | POA: Diagnosis not present

## 2019-11-13 DIAGNOSIS — N318 Other neuromuscular dysfunction of bladder: Secondary | ICD-10-CM | POA: Insufficient documentation

## 2019-11-13 DIAGNOSIS — Z833 Family history of diabetes mellitus: Secondary | ICD-10-CM | POA: Diagnosis not present

## 2019-11-13 DIAGNOSIS — Z91040 Latex allergy status: Secondary | ICD-10-CM | POA: Insufficient documentation

## 2019-11-13 DIAGNOSIS — Z881 Allergy status to other antibiotic agents status: Secondary | ICD-10-CM | POA: Diagnosis not present

## 2019-11-13 DIAGNOSIS — Z79899 Other long term (current) drug therapy: Secondary | ICD-10-CM | POA: Diagnosis not present

## 2019-11-13 DIAGNOSIS — Z888 Allergy status to other drugs, medicaments and biological substances status: Secondary | ICD-10-CM | POA: Insufficient documentation

## 2019-11-13 DIAGNOSIS — N319 Neuromuscular dysfunction of bladder, unspecified: Secondary | ICD-10-CM | POA: Diagnosis present

## 2019-11-13 HISTORY — PX: BOTOX INJECTION: SHX5754

## 2019-11-13 SURGERY — BOTOX INJECTION
Anesthesia: Monitor Anesthesia Care

## 2019-11-13 MED ORDER — PROPOFOL 10 MG/ML IV BOLUS
INTRAVENOUS | Status: DC | PRN
  Administered 2019-11-13: 10 mg via INTRAVENOUS

## 2019-11-13 MED ORDER — HYDROMORPHONE HCL 1 MG/ML IJ SOLN: 0.2500 mg | INTRAMUSCULAR | Status: DC | PRN

## 2019-11-13 MED ORDER — LACTATED RINGERS IV SOLN: INTRAVENOUS | Status: DC

## 2019-11-13 MED ORDER — ONDANSETRON HCL 4 MG/2ML IJ SOLN
INTRAMUSCULAR | Status: AC
  Filled 2019-11-13: qty 2

## 2019-11-13 MED ORDER — PROPOFOL 500 MG/50ML IV EMUL
INTRAVENOUS | Status: DC | PRN
  Administered 2019-11-13: 80 ug/kg/min via INTRAVENOUS

## 2019-11-13 MED ORDER — PROMETHAZINE HCL 25 MG/ML IJ SOLN: 6.2500 mg | INTRAMUSCULAR | Status: DC | PRN

## 2019-11-13 MED ORDER — PROPOFOL 500 MG/50ML IV EMUL
INTRAVENOUS | Status: AC
  Filled 2019-11-13: qty 50

## 2019-11-13 MED ORDER — SODIUM CHLORIDE (PF) 0.9 % IJ SOLN
INTRAMUSCULAR | Status: DC | PRN
  Administered 2019-11-13: 20 mL

## 2019-11-13 MED ORDER — SODIUM CHLORIDE (PF) 0.9 % IJ SOLN
INTRAMUSCULAR | Status: AC
  Filled 2019-11-13: qty 20

## 2019-11-13 MED ORDER — OXYCODONE HCL 5 MG/5ML PO SOLN: 5.0000 mg | Freq: Once | ORAL | Status: AC | PRN

## 2019-11-13 MED ORDER — ONABOTULINUMTOXINA 100 UNITS IJ SOLR: 100.0000 [IU] | Freq: Once | INTRAMUSCULAR | Status: DC

## 2019-11-13 MED ORDER — ONABOTULINUMTOXINA 100 UNITS IJ SOLR
100.0000 [IU] | Freq: Once | INTRAMUSCULAR | Status: AC
  Administered 2019-11-13: 200 [IU] via INTRAMUSCULAR

## 2019-11-13 MED ORDER — OXYCODONE HCL 5 MG PO TABS
5.0000 mg | ORAL_TABLET | Freq: Once | ORAL | Status: AC | PRN
  Administered 2019-11-13: 5 mg via ORAL

## 2019-11-13 MED ORDER — CEFAZOLIN SODIUM-DEXTROSE 2-4 GM/100ML-% IV SOLN
2.0000 g | INTRAVENOUS | Status: AC
  Administered 2019-11-13: 2 g via INTRAVENOUS
  Filled 2019-11-13: qty 100

## 2019-11-13 MED ORDER — OXYCODONE HCL 5 MG PO TABS
ORAL_TABLET | ORAL | Status: AC
  Filled 2019-11-13: qty 1

## 2019-11-13 MED ORDER — PROPOFOL 10 MG/ML IV BOLUS
INTRAVENOUS | Status: AC
  Filled 2019-11-13: qty 20

## 2019-11-13 MED ORDER — ONABOTULINUMTOXINA 100 UNITS IJ SOLR
INTRAMUSCULAR | Status: AC
  Filled 2019-11-13: qty 200

## 2019-11-13 MED ORDER — FENTANYL CITRATE (PF) 100 MCG/2ML IJ SOLN
INTRAMUSCULAR | Status: AC
  Filled 2019-11-13: qty 2

## 2019-11-13 MED ORDER — ONDANSETRON HCL 4 MG/2ML IJ SOLN
INTRAMUSCULAR | Status: DC | PRN
  Administered 2019-11-13: 4 mg via INTRAVENOUS

## 2019-11-13 MED ORDER — SODIUM CHLORIDE 0.9 % IR SOLN
Status: DC | PRN
  Administered 2019-11-13: 3000 mL

## 2019-11-13 MED ORDER — FENTANYL CITRATE (PF) 100 MCG/2ML IJ SOLN
INTRAMUSCULAR | Status: DC | PRN
  Administered 2019-11-13: 25 ug via INTRAVENOUS

## 2019-11-13 SURGICAL SUPPLY — 18 items
BAG DRN RND TRDRP ANRFLXCHMBR (UROLOGICAL SUPPLIES) ×1
BAG URINE DRAIN 2000ML AR STRL (UROLOGICAL SUPPLIES) ×2 IMPLANT
BAG URO CATCHER STRL LF (MISCELLANEOUS) ×3 IMPLANT
CATH SILICON 18FR 30CC (CATHETERS) ×2 IMPLANT
CLOTH BEACON ORANGE TIMEOUT ST (SAFETY) ×3 IMPLANT
GLOVE BIO SURGEON STRL SZ7.5 (GLOVE) ×3 IMPLANT
GOWN STRL REUS W/TWL XL LVL3 (GOWN DISPOSABLE) ×6 IMPLANT
KIT TURNOVER KIT A (KITS) IMPLANT
MANIFOLD NEPTUNE II (INSTRUMENTS) ×3 IMPLANT
NDL ASPIRATION 22 (NEEDLE) ×1 IMPLANT
NDL SAFETY ECLIPSE 18X1.5 (NEEDLE) IMPLANT
NEEDLE ASPIRATION 22 (NEEDLE) ×3 IMPLANT
NEEDLE HYPO 18GX1.5 SHARP (NEEDLE) ×3
PACK CYSTO (CUSTOM PROCEDURE TRAY) ×3 IMPLANT
SYR CONTROL 10ML LL (SYRINGE) ×2 IMPLANT
TUBING CONNECTING 10 (TUBING) ×1 IMPLANT
TUBING CONNECTING 10' (TUBING) ×1
WATER STERILE IRR 3000ML UROMA (IV SOLUTION) ×3 IMPLANT

## 2019-11-13 NOTE — H&P (Signed)
CC/HPI: CC: Neurogenic bladder, history of spina bifida  HPI:  39 year old male previously followed by Dr. Risa Grill. He was originally referred for recurrent urinary tract infections in the setting of a history of spina bifida. He has been followed for a few years here. I do not see a recent urodynamics. He has a colostomy. His mother accompanies him. She denies any history of bladder surgery such as bladder augmentation or vesicostomy. When he was younger, he performed clean intermittent catheterization but has not done this for quite some time. His mother states he just dribbles into his diaper.   10/18/2018  Patient underwent a urodynamics. This revealed a very hostile bladder. There was a loss of compliance that was quite significant. He had unstable neurogenic detrusor overactivity. He had a high pressure system.he is highly against having a catheter or suprapubic tube placed. His providers with him. She states she is able to catheterize him 4-6 times daily. He has good hand function but he does not think that he can catheterize himself.   11/16/18:  This patient is two days s/p intravesical botox. His mother called our after hours line last night reporting a fever of 102 F, occurring the prior day. She gave him Tylenol, and then last night it decreased to 100 F prior to our phone conversation. She was also concerned regarding gross hematuria s/p Botox. He is currently taking Bactrim DS b.i.d x 1 week.   Today, his mother states he became afebrile with a temp of 68 F last night after our phone conversation without additional tylenol. He is currently afebrile and feels well. He has a foley catheter in place draining mild gross hematuria.   11/26/18:  This patient is now 12 days status post intravesical Botox therapy. His mother reports that he has remained afebrile and feeling well. She removed his Foley catheter approximately 10 days ago. Her only concern is that she is now having difficulty with straight  cathing the patient, which she reports she was instructed to do q four hours. He is incontinent and wears a diaper.   12/14/18: He returns today for follow up. He is s/p Botox on 3/4. It was recommended that he also perform CIC 4-6 times daily. Overall, he is doing fair since his procedure. However, his mother has been having issues with performing CIC successfully. Unfortunately, she has only be able to perform CIC x 3-4 times since being seen on 3/16. She states that she continues to meet resistance and does not feel she is able to pass the catheter all the way into the bladder. She is able to get some urine out when she attempts catheterization, but only a few ounces. He denies pain associated with CIC, but he has minimal sensation. He denies fevers or chills. No hematuria. He states that his urine has been cloudy. Culture on 3/16 was positive for E. Coli and he was treated with Macrobid.    01/21/19: He is s/p IR SP catheter placement on 4/10 with 12 Fr. catheter. Per impression the procedure was very difficult due to large abdominal pannus, which contained multiple loops of bowel. Even with retraction of the pannus, the bladder was very deep into the skin. The patient's body habitus was felt to make exchange challenging and put him at risk for spontaneous tube retraction out of the bladder lumen. He presents today with his mother. Per her report, he has had poor drainage from the catheter beginning this morning. However, his mother exchanged bag bedside bag to  leg bag and urine output improved. Currently his catheter is draining well with approximatly 600 cc of yellow colored urine in his drainage bag. No reports of hematuria, fevers, or chills. He does have minimal amount of leakage from his urethra, but not bothersome at this time.   02/01/2019  Patient status post suprapubic tube placement on 12/21/2018. This was technically challenging by interventional radiology. Given his body habitus as well as  multiple abdominal surgeries. He is scheduled for a upsizing of the catheter on 02/15/2019. It has been draining well. His mom is flushing it as needed.    03/21/19:  Patient returns today for previously scheduled suprapubic tube exchange. Unfortunately SP tube came out unexpectedly on 03/01/2019 and had to be replaced by interventional radiology. They placed a 16 [place] G-tube. Ceasar Mons has to be intermittently deglogged. No infections.   SP tube was flushed today somewhat forcefully in order to dislodge debris. Now draining well. Given that tube was just placed 2 weeks ago we will plan to see him back in 4 weeks with a suprapubic tube exchange over wire. Until then mother given instructions and supplies to flush catheter daily with 30 cc of sterile fluid.   04/18/2019  Patient presents for suprapubic tube exchange. He is now 5 months out from Botox. He is having increase leakage from the penis.   06/21/2019: He underwent Botox instillation to help with bladder urgency and penile leaking on 09/09 with Dr Gloriann Loan. His s/p catheter was exchanged at that time, upsized to 43fr.   He is due for exchange today. Over the past week his mother has noted increased leaking from his penis. S/p tube has been intermittently draining some mild pyuria and occasional clot material. He is not having leaking around his catheter tubing at the s/p site. He has been afebrile. His mother will occasionally flush his tube if she notices it draining poorly. He is NAD, clear drainage noted in catheter tubing. S/p unremarkable. He does continue oxybutynin 5mg  BID.   07/09/2019: Oxybutynin increased to TID dosing at last OV. Also instructed his mother on daily flushing of his catheter tubing with sterile or distilled water once daily to decrease sedimentation build up. This morning he was found to have increased urethral leaking and catheter was not draining. Appointment was made but catheter began spontaneously draining prior to his  arrival today. He is no longer leaking from his penis. A lot of sediment is noted in his catheter tubing today but grossly draining clear urine.   09/17/2019  Patient last underwent intravesical Botox on 05/22/2019. He intermittently has problems with a clogged/poorly draining catheter and has leakage per urethra if this occurs. Otherwise, he states that he has remained dry.     ALLERGIES: Cipro TABS Contrast Media Ready-Box MISC Latex Gloves MISC Vancomycin HCl SOLR    MEDICATIONS: Oxybutynin Chloride 5 mg tablet 1 tablet PO TID  Antacid  Calcium + D  Cranberry Fruit  Klor-Con 20 meq packet  Metoprolol Tartrate 50 mg tablet Oral  Multi-Day Vitamins tablet Oral  Rosuvastatin Calcium 20 mg tablet  Tylenol Arthritis PRN  Verapamil Hcl 1 PO Daily  Zofran 4 mg tablet Oral     GU PSH: Catheterization For Collection Of Specimen, Single Patient, All Places Of Service - 11/26/2018, 08/08/2018, 12/27/2017 Catheterize For Residual - 08/08/2018, 2017 Compl Change SP Tube - 04/18/2019 Complex cystometrogram, w/ void pressure and urethral pressure profile studies, any technique - 10/11/2018 Complex Uroflow - 10/11/2018 Cystourethroscopy, W/Injection For Chemodenervation Of Bladder -  05/22/2019 Emg surf Electrd - 10/11/2018 Inject For cystogram - 10/11/2018 Intrabd voidng Press - 10/11/2018 Simple Change SP Tube - 08/23/2019, 07/19/2019, 06/21/2019, 05/22/2019       PSH Notes: Ileostomy, Colostomy, Cholecystectomy, Appendectomy   NON-GU PSH: Appendectomy - 12-30-14 Cholecystectomy (open) - 30-Dec-2014 Colostomy - 2014-12-30     GU PMH: Bladder, Oth neuromuscular dysfunction - 11/26/2018 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, - 10/18/2018 Incomplete bladder emptying - 10/11/2018 Incontinence w/o Sensation - 09/19/2018 Candidal balanitis - 11/08/2017 Oth urogenital candidiasis - 11/08/2017 Obstructive and reflux uropathy, 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/2017 Bladder, Neuromuscular dysfunction, Unspec, Neurogenic bladder -  12/30/14 Chronic cystitis (w/o hematuria), Chronic cystitis - 2014/12/30 Urge incontinence, Urge incontinence of urine - 12-30-14    NON-GU PMH: Spina bifida, unspecified - 09/19/2018 Encounter for general adult medical examination without abnormal findings, Encounter for preventive health examination - Dec 30, 2014 Personal history of other diseases of the circulatory system, History of hypertension - December 30, 2014 Personal history of other diseases of the nervous system and sense organs, History of hydrocephalus - December 30, 2014 Personal history 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  Drinks 1 caffeinated drink per day.     Notes: Caffeine use, Never smoker, Alcohol use, Disabled, Single, Number of children, Never used tobacco   REVIEW OF SYSTEMS:    GU Review Male:   Patient denies frequent urination, hard to postpone urination, burning/ pain with urination, get up at night to urinate, leakage of urine, stream starts and stops, trouble starting your stream, have to strain to urinate , erection problems, and penile pain.  Gastrointestinal (Upper):   Patient denies nausea, vomiting, and indigestion/ heartburn.  Gastrointestinal (Lower):   Patient denies diarrhea and constipation.  Constitutional:   Patient denies fever, night sweats, weight loss, and fatigue.  Skin:   Patient denies skin rash/ lesion and itching.  Eyes:   Patient denies blurred vision and double vision.  Ears/ Nose/ Throat:   Patient denies sore throat and sinus problems.  Hematologic/Lymphatic:   Patient denies swollen  glands and easy bruising.  Cardiovascular:   Patient denies leg swelling and chest pains.  Respiratory:   Patient denies cough and shortness of breath.  Endocrine:   Patient denies excessive thirst.  Musculoskeletal:   Patient denies back pain and joint pain.  Neurological:   Patient denies headaches and dizziness.  Psychologic:   Patient denies depression and anxiety.   VITAL SIGNS:      09/17/2019 03:12 PM  BP 114/77 mmHg  Pulse 83 /min  Temperature 97.7 F / 36.5 C   MULTI-SYSTEM PHYSICAL EXAMINATION:    Respiratory: No labored breathing, no use of accessory muscles.   Cardiovascular: Normal temperature, adequate perfusion of extremities  Skin: No paleness, no jaundice  Neurologic / Psychiatric: Oriented to time, oriented to place, oriented to person. No depression, no anxiety, no agitation.  Gastrointestinal: No mass, no tenderness, no rigidity, obese abdomen. Productive colostomy in place. Suprapubic tube in place with some mild scarring around it but no evidence of infection.  Eyes: Normal conjunctivae. Normal eyelids.  Musculoskeletal: In a wheelchair. Somewhat contracted lower extremities  PAST DATA REVIEWED:  Source Of History:  Patient  Records Review:   Previous Doctor Records, Previous Patient Records   PROCEDURES:         Simple Change SP Tube - 51705  The patient's indwelling SP tube was carefully removed. A 18 French silicone Foley catheter was inserted into the bladder using sterile technique. The patient was taught routine catheter care. A bedside bag was connected. Hand irrigation of the bladder with sterile water was performed.   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 This will be 6 months from prior administration. Recommend performing this every 6 months. Continue monthly catheter changes.   CC: Dr. Jetty Duhamel        Next Appointment:      Next Appointment: 10/18/2019 03:00 PM    Appointment Type: Nurse Visit NO Urine    Location: [facility] [phone]    Provider: NVPodA NVPodA    Reason for Visit: 1 mo tube changes

## 2019-11-13 NOTE — Anesthesia Procedure Notes (Signed)
Procedure Name: MAC Date/Time: 11/13/2019 9:52 AM Performed by: Niel Hummer, CRNA Pre-anesthesia Checklist: Patient identified, Emergency Drugs available, Suction available and Patient being monitored Oxygen Delivery Method: Simple face mask

## 2019-11-13 NOTE — Interval H&P Note (Signed)
History and Physical Interval Note:  11/13/2019 8:26 AM  [name]  has presented today for surgery, with the diagnosis of NEUROGENIC BLADDER.  The various methods of treatment have been discussed with the patient and family. After consideration of risks, benefits and other options for treatment, the patient has consented to  Procedure(s): CYSTOSCOPY BOTOX INJECTION (N/A) as a surgical intervention.  The patient's history has been reviewed, patient examined, no change in status, stable for surgery.  I have reviewed the patient's chart and labs.  Questions were answered to the patient's satisfaction.     Marton Redwood, III

## 2019-11-13 NOTE — Transfer of Care (Signed)
Immediate Anesthesia Transfer of Care Note  Patient: [name]  Procedure(s) Performed: CYSTOSCOPY BOTOX INJECTION (N/A )  Patient Location: PACU  Anesthesia Type:MAC  Level of Consciousness: awake, alert  and oriented  Airway & Oxygen Therapy: Patient Spontanous Breathing and Patient connected to face mask oxygen  Post-op Assessment: Report given to RN and Post -op Vital signs reviewed and stable  Post vital signs: Reviewed and stable  Last Vitals:  Vitals Value Taken Time  BP    Temp    Pulse    Resp    SpO2      Last Pain:  Vitals:   11/13/19 0834  TempSrc: Oral  PainSc: 3       Patients Stated Pain Goal: (unable) (25/83/46 2194)  Complications: No apparent anesthesia complications

## 2019-11-13 NOTE — Op Note (Signed)
Operative Note  Preoperative diagnosis:  1. Neurogenic bladder with detrusor overactivity  Postoperative diagnosis: 1.  same  Procedure(s): 1.  Cystoscopy with 200 unit intravesical Botox injection  2.  Suprapubic tube exchange  Surgeon: Link Snuffer, MD  Assistants: none  Anesthesia:   mac  Complications:  None immediate  EBL:  minimal  Specimens: 1.   none  Drains/Catheters: 1.  18 French silicone catheter  Intraoperative findings:  1.   Bulbar urethra with evidence of false passages. I was able to navigate around this and into the bladder. 2.  Contracted and small capacity bladder  Indication:  39 year old male with neurogenic bladder and detrusor overactivity currently managing his bladder with suprapubic tube and intravesical Botox  Description of procedure:  The patient was identified and consent was obtained.  The patient was taken to the operating room and placed in the supine position.  The patient was placed under  mac anesthesia.  Perioperative antibiotics were administered.  The patient was placed in dorsal lithotomy.  Patient was prepped and draped in a standard sterile fashion and a timeout was performed.   a 68 [place] scope was advanced into the urethra and into the bladder.  The suprapubic tube was removed.  I then proceeded to inject 200 units of Botox intravesically throughout the bladder by standard template.  I then exchanged the suprapubic catheter for an 75 French silicone catheter.  This concluded the operation.  Patient tolerated the procedure well  Plan: follow-up in several months for planning for repeat Botox at about 6 months.  Continue monthly catheter exchanges.

## 2019-11-13 NOTE — Anesthesia Preprocedure Evaluation (Signed)
Anesthesia Evaluation  Patient identified by MRN, date of birth, ID band Patient awake    Reviewed: Allergy & Precautions, H&P , NPO status , Patient's Chart, lab work & pertinent test results, reviewed documented beta blocker date and time   Airway Mallampati: II  TM Distance: >3 FB Neck ROM: full    Dental no notable dental hx. (+) Poor Dentition   Pulmonary neg pulmonary ROS,    Pulmonary exam normal breath sounds clear to auscultation       Cardiovascular Exercise Tolerance: Good hypertension, Pt. on medications and Pt. 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  Arnold-Chiari malformation    Neurogenic bladder   INCONTINENT -  Congenital paraplegia (HCC)     negative psych ROS   GI/Hepatic Neg liver ROS, GERD  Medicated and Controlled,  Endo/Other  negative endocrine ROS  Renal/GU Renal disease  negative genitourinary   Musculoskeletal   Abdominal (+) + obese,   Peds  Hematology  (+) Blood dyscrasia, anemia ,   Anesthesia Other Findings   Reproductive/Obstetrics negative OB ROS                                                              Anesthesia Evaluation  Patient identified by MRN, date of birth, ID band Patient awake    Reviewed: Allergy & Precautions, H&P , NPO status , Patient's Chart, lab work & pertinent test results  Airway Mallampati: III TM Distance: >3 FB Neck ROM: Limited    Dental  (+) Teeth Intact, Poor Dentition, Dental Advisory Given   Pulmonary neg pulmonary ROS,  breath sounds clear to auscultation  + decreased breath sounds      Cardiovascular hypertension, Pt. 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 negative psych ROS   GI/Hepatic negative GI ROS, Neg liver ROS, GERD-  ,  Endo/Other  negative endocrine ROS  Renal/GU negative Renal ROS  negative genitourinary   Musculoskeletal negative musculoskeletal ROS (+)   Abdominal   Peds  Hematology negative hematology ROS (+) anemia ,   Anesthesia Other Findings Diminished inspiratory effort secondary to RUQ pain  Reproductive/Obstetrics                           Anesthesia Physical Anesthesia Plan  ASA: III  Anesthesia Plan: General   Post-op Pain Management:    Induction: Intravenous  Airway Management Planned: LMA  Additional Equipment:   Intra-op Plan:   Post-operative Plan: Extubation in OR  Informed Consent: I have reviewed the patients History and Physical, chart, labs and discussed the procedure including the risks, benefits and alternatives for the proposed anesthesia with the patient or authorized representative who has indicated his/her understanding and acceptance.   Dental advisory given  Plan Discussed with: CRNA  Anesthesia Plan Comments:         Anesthesia Quick Evaluation  Anesthesia Evaluation  Patient identified by MRN, date of birth, ID band Patient awake    Reviewed: Allergy & Precautions, H&P , NPO status , Patient's Chart, lab work & pertinent test results  Airway Mallampati: II TM Distance: >3 FB Neck ROM: Full   Comment: Intubated earlier today. Dental no notable dental hx.    Pulmonary neg pulmonary ROS,  breath sounds clear to auscultation  Pulmonary exam normal       Cardiovascular hypertension, Pt. on medications Rhythm:Regular Rate:Normal     Neuro/Psych negative neurological ROS  negative psych ROS   GI/Hepatic Neg liver ROS, GERD-  Medicated,  Endo/Other  negative endocrine ROS  Renal/GU negative Renal  ROS  negative genitourinary   Musculoskeletal negative musculoskeletal ROS (+)   Abdominal (+) + obese,   Peds negative pediatric ROS (+)  Hematology negative hematology ROS (+)   Anesthesia Other Findings   Reproductive/Obstetrics negative OB ROS                           Anesthesia Physical Anesthesia Plan  ASA: IV and emergent  Anesthesia Plan: General   Post-op Pain Management:    Induction: Intravenous  Airway Management Planned: Oral ETT  Additional Equipment:   Intra-op Plan:   Post-operative Plan: Post-operative intubation/ventilation  Informed Consent: I have reviewed the patients History and Physical, chart, labs and discussed the procedure including the risks, benefits and alternatives for the proposed anesthesia with the patient or authorized representative who has indicated his/her understanding and acceptance.     Plan Discussed with: CRNA  Anesthesia Plan Comments: (Intubated in ICU. Low blood pressure. CVC in place. Na 125. Hgb 10.1. Elevated LFTs. Levophed at 35 mcg/min. Vent. Settings: TV 450, FiO2 0.7 PEEP 5 RR 16. Last blood pressure 83/48. HR 125. O2Sat 100%.  Discussed critical condition with patient's mother.  Will attempt arterial line placement.)      Anesthesia Quick Evaluation   Anesthesia Physical  Anesthesia Plan  ASA: III  Anesthesia Plan: MAC   Post-op Pain Management:    Induction: Intravenous  PONV Risk Score and Plan: 1 and Ondansetron, Treatment may vary due to age or medical condition and Propofol infusion  Airway Management Planned: Nasal Cannula, Simple Face Mask and Natural Airway  Additional Equipment:   Intra-op Plan:   Post-operative Plan:   Informed Consent: I have reviewed the patients History and Physical, chart, labs and discussed the procedure including the risks, benefits and alternatives for the proposed anesthesia with the patient or authorized representative who has  indicated his/her understanding and acceptance.     Dental Advisory Given  Plan Discussed with: CRNA, Anesthesiologist and Surgeon  Anesthesia Plan Comments:         Anesthesia Quick Evaluation

## 2019-11-13 NOTE — Anesthesia Postprocedure Evaluation (Signed)
Anesthesia Post Note  Patient: [name]  Procedure(s) Performed: CYSTOSCOPY BOTOX INJECTION (N/A )     Patient location during evaluation: PACU Anesthesia Type: MAC Level of consciousness: awake and alert Pain management: pain level controlled Vital Signs Assessment: post-procedure vital signs reviewed and stable Respiratory status: spontaneous breathing, nonlabored ventilation and respiratory function stable Cardiovascular status: blood pressure returned to baseline and stable Postop Assessment: no apparent nausea or vomiting Anesthetic complications: no    Last Vitals:  Vitals:   11/13/19 1108 11/13/19 1115  BP: 97/63 (!) 102/54  Pulse:  66  Resp:  16  Temp: (!) 36.4 C (!) 36.4 C  SpO2: 100% 100%    Last Pain:  Vitals:   11/13/19 1130  TempSrc:   PainSc: [place]

## 2019-12-23 ENCOUNTER — Ambulatory Visit (HOSPITAL_COMMUNITY)
Admission: AD | Admit: 2019-12-23 | Discharge: 2019-12-23 | Disposition: A | Discharge status: Home / Self Care | Payer: Medicare Other | Source: Other Acute Inpatient Hospital | Attending: Urology | Admitting: Urology

## 2019-12-23 ENCOUNTER — Ambulatory Visit (HOSPITAL_COMMUNITY): Payer: Medicare Other | Admitting: Anesthesiology

## 2019-12-23 ENCOUNTER — Other Ambulatory Visit: Payer: Self-pay | Admitting: Urology

## 2019-12-23 ENCOUNTER — Encounter (HOSPITAL_COMMUNITY)
Admission: AD | Disposition: A | Discharge status: Home / Self Care | Payer: Self-pay | Source: Other Acute Inpatient Hospital | Attending: Urology

## 2019-12-23 ENCOUNTER — Other Ambulatory Visit: Payer: Self-pay

## 2019-12-23 DIAGNOSIS — G8220 Paraplegia, unspecified: Secondary | ICD-10-CM | POA: Insufficient documentation

## 2019-12-23 DIAGNOSIS — I10 Essential (primary) hypertension: Secondary | ICD-10-CM | POA: Diagnosis not present

## 2019-12-23 DIAGNOSIS — T83098A Other mechanical complication of other indwelling urethral catheter, initial encounter: Secondary | ICD-10-CM | POA: Diagnosis not present

## 2019-12-23 DIAGNOSIS — Z881 Allergy status to other antibiotic agents status: Secondary | ICD-10-CM | POA: Diagnosis not present

## 2019-12-23 DIAGNOSIS — Z91040 Latex allergy status: Secondary | ICD-10-CM | POA: Diagnosis not present

## 2019-12-23 DIAGNOSIS — Q059 Spina bifida, unspecified: Secondary | ICD-10-CM | POA: Diagnosis not present

## 2019-12-23 DIAGNOSIS — Z87440 Personal history of urinary (tract) infections: Secondary | ICD-10-CM | POA: Diagnosis not present

## 2019-12-23 DIAGNOSIS — Z9049 Acquired absence of other specified parts of digestive tract: Secondary | ICD-10-CM | POA: Insufficient documentation

## 2019-12-23 DIAGNOSIS — Z20822 Contact with and (suspected) exposure to covid-19: Secondary | ICD-10-CM | POA: Insufficient documentation

## 2019-12-23 DIAGNOSIS — Z982 Presence of cerebrospinal fluid drainage device: Secondary | ICD-10-CM | POA: Insufficient documentation

## 2019-12-23 DIAGNOSIS — K219 Gastro-esophageal reflux disease without esophagitis: Secondary | ICD-10-CM | POA: Diagnosis not present

## 2019-12-23 DIAGNOSIS — N99512 Cystostomy malfunction: Secondary | ICD-10-CM | POA: Diagnosis not present

## 2019-12-23 DIAGNOSIS — Z91041 Radiographic dye allergy status: Secondary | ICD-10-CM | POA: Insufficient documentation

## 2019-12-23 DIAGNOSIS — G919 Hydrocephalus, unspecified: Secondary | ICD-10-CM | POA: Diagnosis not present

## 2019-12-23 DIAGNOSIS — Z888 Allergy status to other drugs, medicaments and biological substances status: Secondary | ICD-10-CM | POA: Insufficient documentation

## 2019-12-23 DIAGNOSIS — N319 Neuromuscular dysfunction of bladder, unspecified: Secondary | ICD-10-CM | POA: Diagnosis present

## 2019-12-23 DIAGNOSIS — Z79899 Other long term (current) drug therapy: Secondary | ICD-10-CM | POA: Diagnosis not present

## 2019-12-23 DIAGNOSIS — N318 Other neuromuscular dysfunction of bladder: Secondary | ICD-10-CM | POA: Diagnosis not present

## 2019-12-23 DIAGNOSIS — D649 Anemia, unspecified: Secondary | ICD-10-CM | POA: Diagnosis not present

## 2019-12-23 DIAGNOSIS — Z833 Family history of diabetes mellitus: Secondary | ICD-10-CM | POA: Diagnosis not present

## 2019-12-23 DIAGNOSIS — N3941 Urge incontinence: Secondary | ICD-10-CM | POA: Diagnosis not present

## 2019-12-23 DIAGNOSIS — Z836 Family history of other diseases of the respiratory system: Secondary | ICD-10-CM | POA: Diagnosis not present

## 2019-12-23 DIAGNOSIS — Z8249 Family history of ischemic heart disease and other diseases of the circulatory system: Secondary | ICD-10-CM | POA: Diagnosis not present

## 2019-12-23 DIAGNOSIS — X58XXXA Exposure to other specified factors, initial encounter: Secondary | ICD-10-CM | POA: Diagnosis not present

## 2019-12-23 DIAGNOSIS — R519 Headache, unspecified: Secondary | ICD-10-CM | POA: Diagnosis not present

## 2019-12-23 HISTORY — PX: INSERTION OF SUPRAPUBIC CATHETER: SHX5870

## 2019-12-23 LAB — RESPIRATORY PANEL BY RT PCR (FLU A&B, COVID)
Influenza A by PCR: NEGATIVE
Influenza B by PCR: NEGATIVE
SARS Coronavirus 2 by RT PCR: NEGATIVE

## 2019-12-23 SURGERY — INSERTION, SUPRAPUBIC CATHETER
Anesthesia: General

## 2019-12-23 MED ORDER — LACTATED RINGERS IV SOLN: INTRAVENOUS | Status: DC

## 2019-12-23 MED ORDER — PHENYLEPHRINE 40 MCG/ML (10ML) SYRINGE FOR IV PUSH (FOR BLOOD PRESSURE SUPPORT)
PREFILLED_SYRINGE | INTRAVENOUS | Status: AC
  Filled 2019-12-23: qty 10

## 2019-12-23 MED ORDER — DEXAMETHASONE SODIUM PHOSPHATE 10 MG/ML IJ SOLN
INTRAMUSCULAR | Status: AC
  Filled 2019-12-23: qty 1

## 2019-12-23 MED ORDER — MIDAZOLAM HCL 2 MG/2ML IJ SOLN
INTRAMUSCULAR | Status: AC
  Filled 2019-12-23: qty 2

## 2019-12-23 MED ORDER — FENTANYL CITRATE (PF) 100 MCG/2ML IJ SOLN
INTRAMUSCULAR | Status: DC | PRN
  Administered 2019-12-23: 50 ug via INTRAVENOUS

## 2019-12-23 MED ORDER — ONDANSETRON HCL 4 MG/2ML IJ SOLN
INTRAMUSCULAR | Status: AC
  Filled 2019-12-23: qty 2

## 2019-12-23 MED ORDER — ONDANSETRON HCL 4 MG/2ML IJ SOLN
INTRAMUSCULAR | Status: DC | PRN
  Administered 2019-12-23: 4 mg via INTRAVENOUS

## 2019-12-23 MED ORDER — PHENYLEPHRINE 40 MCG/ML (10ML) SYRINGE FOR IV PUSH (FOR BLOOD PRESSURE SUPPORT)
PREFILLED_SYRINGE | INTRAVENOUS | Status: DC | PRN
  Administered 2019-12-23: 120 ug via INTRAVENOUS

## 2019-12-23 MED ORDER — PROPOFOL 10 MG/ML IV BOLUS
INTRAVENOUS | Status: AC
  Filled 2019-12-23: qty 20

## 2019-12-23 MED ORDER — LIDOCAINE 2% (20 MG/ML) 5 ML SYRINGE
INTRAMUSCULAR | Status: DC | PRN
  Administered 2019-12-23: 100 mg via INTRAVENOUS

## 2019-12-23 MED ORDER — PROPOFOL 10 MG/ML IV BOLUS
INTRAVENOUS | Status: DC | PRN
  Administered 2019-12-23: 150 mg via INTRAVENOUS

## 2019-12-23 MED ORDER — FENTANYL CITRATE (PF) 100 MCG/2ML IJ SOLN: 25.0000 ug | INTRAMUSCULAR | Status: DC | PRN

## 2019-12-23 MED ORDER — LIDOCAINE 2% (20 MG/ML) 5 ML SYRINGE
INTRAMUSCULAR | Status: AC
  Filled 2019-12-23: qty 5

## 2019-12-23 MED ORDER — FENTANYL CITRATE (PF) 100 MCG/2ML IJ SOLN
INTRAMUSCULAR | Status: AC
  Filled 2019-12-23: qty 2

## 2019-12-23 MED ORDER — STERILE WATER FOR IRRIGATION IR SOLN
Status: DC | PRN
  Administered 2019-12-23: 10 mL

## 2019-12-23 MED ORDER — PROMETHAZINE HCL 25 MG/ML IJ SOLN: 6.2500 mg | INTRAMUSCULAR | Status: DC | PRN

## 2019-12-23 MED ORDER — MEPERIDINE HCL 50 MG/ML IJ SOLN: 6.2500 mg | INTRAMUSCULAR | Status: DC | PRN

## 2019-12-23 MED ORDER — DEXAMETHASONE SODIUM PHOSPHATE 4 MG/ML IJ SOLN
INTRAMUSCULAR | Status: DC | PRN
  Administered 2019-12-23: 10 mg via INTRAVENOUS

## 2019-12-23 MED ORDER — MIDAZOLAM HCL 2 MG/2ML IJ SOLN
INTRAMUSCULAR | Status: DC | PRN
  Administered 2019-12-23: 2 mg via INTRAVENOUS

## 2019-12-23 MED ORDER — SODIUM CHLORIDE 0.9 % IV SOLN
2.0000 g | INTRAVENOUS | Status: AC
  Administered 2019-12-23: 17:00:00 2 g via INTRAVENOUS
  Filled 2019-12-23: qty 20

## 2019-12-23 MED ORDER — 0.9 % SODIUM CHLORIDE (POUR BTL) OPTIME
TOPICAL | Status: DC | PRN
  Administered 2019-12-23: 1000 mL

## 2019-12-23 MED ORDER — BUPIVACAINE HCL (PF) 0.5 % IJ SOLN
INTRAMUSCULAR | Status: AC
  Filled 2019-12-23: qty 30

## 2019-12-23 SURGICAL SUPPLY — 22 items
BAG DRN RND TRDRP ANRFLXCHMBR (UROLOGICAL SUPPLIES) ×2
BAG URINE DRAIN 2000ML AR STRL (UROLOGICAL SUPPLIES) ×5 IMPLANT
BAG URINE LEG 500ML (DRAIN) ×3 IMPLANT
BLADE SURG 15 STRL LF DISP TIS (BLADE) ×1 IMPLANT
BLADE SURG 15 STRL SS (BLADE) ×3
CATH BONANNO SUPRAPUBIC 14G (CATHETERS) ×3 IMPLANT
CATH SILICONE 16FRX5CC (CATHETERS) ×4 IMPLANT
COVER WAND RF STERILE (DRAPES) IMPLANT
ELECT REM PT RETURN 15FT ADLT (MISCELLANEOUS) ×3 IMPLANT
GAUZE SPONGE 4X4 12PLY STRL (GAUZE/BANDAGES/DRESSINGS) ×2 IMPLANT
GLOVE BIOGEL M 8.0 STRL (GLOVE) ×3 IMPLANT
GOWN STRL REUS W/TWL XL LVL3 (GOWN DISPOSABLE) ×6 IMPLANT
KIT TURNOVER KIT A (KITS) IMPLANT
MANIFOLD NEPTUNE II (INSTRUMENTS) ×3 IMPLANT
NEEDLE HYPO 22GX1.5 SAFETY (NEEDLE) IMPLANT
NS IRRIG 1000ML POUR BTL (IV SOLUTION) ×3 IMPLANT
PACK CYSTO (CUSTOM PROCEDURE TRAY) ×3 IMPLANT
PENCIL SMOKE EVACUATOR (MISCELLANEOUS) IMPLANT
PLUG CATH AND CAP STER (CATHETERS) IMPLANT
SUT ETHILON 3 0 FSL (SUTURE) ×3 IMPLANT
TOWEL OR 17X26 10 PK STRL BLUE (TOWEL DISPOSABLE) ×3 IMPLANT
WATER STERILE IRR 3000ML UROMA (IV SOLUTION) ×3 IMPLANT

## 2019-12-23 NOTE — Anesthesia Preprocedure Evaluation (Addendum)
Anesthesia Evaluation  Patient identified by MRN, date of birth, ID band Patient awake    Reviewed: Allergy & Precautions, NPO status , Patient's Chart, lab work & pertinent test results, reviewed documented beta blocker date and time   Airway Mallampati: II  TM Distance: >3 FB Neck ROM: Full    Dental  (+) Dental Advisory Given, Chipped, Poor Dentition, Missing   Pulmonary neg pulmonary ROS,    Pulmonary exam normal breath sounds clear to auscultation       Cardiovascular hypertension, Pt. on home beta blockers and Pt. on medications Normal cardiovascular exam Rhythm:Regular Rate:Normal     Neuro/Psych  Headaches, Spina bifida S/p VP shunt Paraplegia negative psych ROS   GI/Hepatic Neg liver ROS, GERD  ,  Endo/Other  negative endocrine ROS  Renal/GU Renal disease   Neurogenic bladder    Musculoskeletal negative musculoskeletal ROS (+)   Abdominal   Peds  Hematology  (+) Blood dyscrasia, anemia ,   Anesthesia Other Findings   Reproductive/Obstetrics                                                            Anesthesia Evaluation  Patient identified by MRN, date of birth, ID band Patient awake    Reviewed: Allergy & Precautions, H&P , NPO status , Patient's Chart, lab work & pertinent test results, reviewed documented beta blocker date and time   Airway Mallampati: II  TM Distance: >3 FB Neck ROM: full    Dental no notable dental hx. (+) Poor Dentition   Pulmonary neg pulmonary ROS,    Pulmonary exam normal breath sounds clear to auscultation       Cardiovascular Exercise Tolerance: Good hypertension, Pt. on medications and Pt. 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  Arnold-Chiari malformation     Neurogenic bladder   INCONTINENT -  Congenital paraplegia (HCC)     negative psych ROS   GI/Hepatic Neg liver ROS, GERD  Medicated and Controlled,  Endo/Other  negative endocrine ROS  Renal/GU Renal disease  negative genitourinary   Musculoskeletal   Abdominal (+) + obese,   Peds  Hematology  (+) Blood dyscrasia, anemia ,   Anesthesia Other Findings   Reproductive/Obstetrics negative OB ROS                                                              Anesthesia Evaluation  Patient identified by MRN, date of birth, ID band Patient awake    Reviewed: Allergy & Precautions, H&P , NPO status , Patient's Chart, lab work & pertinent test results  Airway Mallampati: III TM Distance: >3 FB Neck ROM: Limited    Dental  (+) Teeth Intact, Poor Dentition, Dental Advisory Given   Pulmonary neg pulmonary ROS,  breath sounds clear to auscultation  + decreased breath sounds      Cardiovascular hypertension, Pt. 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 negative psych ROS   GI/Hepatic negative GI ROS, Neg liver ROS, GERD-  ,  Endo/Other  negative endocrine ROS  Renal/GU negative Renal ROS  negative genitourinary   Musculoskeletal negative musculoskeletal ROS (+)   Abdominal   Peds  Hematology negative hematology ROS (+) anemia ,   Anesthesia Other Findings Diminished inspiratory effort secondary to RUQ pain  Reproductive/Obstetrics                           Anesthesia Physical Anesthesia Plan  ASA: III  Anesthesia Plan: General   Post-op Pain Management:    Induction: Intravenous  Airway Management Planned: LMA  Additional Equipment:   Intra-op Plan:   Post-operative Plan: Extubation in OR  Informed Consent: I have  reviewed the patients History and Physical, chart, labs and discussed the procedure including the risks, benefits and alternatives for the proposed anesthesia with the patient or authorized representative who has indicated his/her understanding and acceptance.   Dental advisory given  Plan Discussed with: CRNA  Anesthesia Plan Comments:         Anesthesia Quick Evaluation                                  Anesthesia Evaluation  Patient identified by MRN, date of birth, ID band Patient awake    Reviewed: Allergy & Precautions, H&P , NPO status , Patient's Chart, lab work & pertinent test results  Airway Mallampati: II TM Distance: >3 FB Neck ROM: Full   Comment: Intubated earlier today. Dental no notable dental hx.    Pulmonary neg pulmonary ROS,  breath sounds clear to auscultation  Pulmonary exam normal       Cardiovascular hypertension, Pt. on medications Rhythm:Regular Rate:Normal     Neuro/Psych negative neurological ROS  negative psych ROS   GI/Hepatic Neg liver ROS, GERD-  Medicated,  Endo/Other  negative endocrine ROS  Renal/GU negative Renal ROS  negative genitourinary   Musculoskeletal negative musculoskeletal ROS (+)   Abdominal (+) + obese,   Peds negative pediatric ROS (+)  Hematology negative hematology ROS (+)   Anesthesia Other Findings   Reproductive/Obstetrics negative OB ROS                           Anesthesia Physical Anesthesia Plan  ASA: IV and emergent  Anesthesia Plan: General   Post-op Pain Management:    Induction: Intravenous  Airway Management Planned: Oral ETT  Additional Equipment:   Intra-op Plan:   Post-operative Plan: Post-operative intubation/ventilation  Informed Consent: I have reviewed the patients History and Physical, chart, labs and discussed the procedure including the risks, benefits and alternatives for the proposed anesthesia with the patient or authorized  representative who has indicated his/her understanding and acceptance.     Plan Discussed with: CRNA  Anesthesia Plan Comments: (Intubated in ICU. Low blood pressure. CVC in place. Na 125. Hgb 10.1. Elevated LFTs. Levophed at 35 mcg/min. Vent. Settings: TV 450, FiO2 0.7 PEEP 5 RR 16. Last blood pressure 83/48. HR 125. O2Sat 100%.  Discussed critical condition with patient's mother.  Will attempt arterial line placement.)      Anesthesia Quick Evaluation   Anesthesia Physical  Anesthesia Plan  ASA: III  Anesthesia Plan: MAC  Post-op Pain Management:    Induction: Intravenous  PONV Risk Score and Plan: 1 and Ondansetron, Treatment may vary due to age or medical condition and Propofol infusion  Airway Management Planned: Nasal Cannula, Simple Face Mask and Natural Airway  Additional Equipment:   Intra-op Plan:   Post-operative Plan:   Informed Consent: I have reviewed the patients History and Physical, chart, labs and discussed the procedure including the risks, benefits and alternatives for the proposed anesthesia with the patient or authorized representative who has indicated his/her understanding and acceptance.     Dental Advisory Given  Plan Discussed with: CRNA, Anesthesiologist and Surgeon  Anesthesia Plan Comments:         Anesthesia Quick Evaluation  Anesthesia Physical Anesthesia Plan  ASA: III  Anesthesia Plan: General   Post-op Pain Management:    Induction: Intravenous  PONV Risk Score and Plan: 3 and Ondansetron, Midazolam, Treatment may vary due to age or medical condition and Dexamethasone  Airway Management Planned: LMA  Additional Equipment: None  Intra-op Plan:   Post-operative Plan: Extubation in OR  Informed Consent: I have reviewed the patients History and Physical, chart, labs and discussed the procedure including the risks, benefits and alternatives for the proposed anesthesia with the patient or authorized  representative who has indicated his/her understanding and acceptance.     Dental advisory given  Plan Discussed with: CRNA  Anesthesia Plan Comments:        Anesthesia Quick Evaluation

## 2019-12-23 NOTE — H&P (Signed)
CC/HPI: CC: Neurogenic bladder, history of spina bifida  HPI:  39 year old male previously followed by Dr. Risa Grill. He was originally referred for recurrent urinary tract infections in the setting of a history of spina bifida. He has been followed for a few years here and is currently managed with a suprapubic catheter but can void per urethra some. He is for a suprapubic tube replacement today. He has a small capacity bladder with poor compliance and has been getting botox as well.   I was asked to see the patient for SP tube insertion after the nursing staff were unable to pass a new catheter.     ALLERGIES: Cipro TABS Contrast Media Ready-Box MISC Latex Gloves MISC Vancomycin HCl SOLR    MEDICATIONS: Oxybutynin Chloride 5 mg tablet 1 tablet PO TID  Antacid  Calcium + D  Cranberry Fruit  Klor-Con 20 meq packet  Metoprolol Tartrate 50 mg tablet Oral  Multi-Day Vitamins tablet Oral  Rosuvastatin Calcium 20 mg tablet  Tylenol Arthritis PRN  Verapamil Hcl 1 PO Daily  Zofran 4 mg tablet Oral     GU PSH: Catheterization For Collection Of Specimen, Single Patient, All Places Of Service - 11/26/2018, 08/08/2018, 12/27/2017 Catheterize For Residual - 08/08/2018, 2015-12-06 Compl Change SP Tube - 04/18/2019 Complex cystometrogram, w/ void pressure and urethral pressure profile studies, any technique - 10/11/2018 Complex Uroflow - 10/11/2018 Cystourethroscopy, W/Injection For Chemodenervation Of Bladder - 11/13/2019, 05/22/2019 Emg surf Electrd - 10/11/2018 Inject For cystogram - 10/11/2018 Intrabd voidng Press - 10/11/2018 Simple Change SP Tube - 11/22/2019, 11/13/2019, 10/18/2019, 09/17/2019, 08/23/2019, 07/19/2019, 06/21/2019, 05/22/2019       PSH Notes: Ileostomy, Colostomy, Cholecystectomy, Appendectomy   NON-GU PSH: Appendectomy - 06-Dec-2014 Cholecystectomy (open) - Dec 06, 2014 Colostomy - 12-06-14     GU PMH: Bladder, Oth neuromuscular dysfunction - 11/26/2018 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, -  10/18/2018 Incomplete bladder emptying - 10/11/2018 Incontinence w/o Sensation - 09/19/2018 Candidal balanitis - 12/05/17 Oth urogenital candidiasis - 2017-12-05 Obstructive and reflux uropathy, Unspec - 12/05/2017 Bladder, Neuromuscular dysfunction, Unspec, Neurogenic bladder - 12/06/2014 Chronic cystitis (w/o hematuria), Chronic cystitis - December 06, 2014 Urge incontinence, Urge incontinence of urine - 2014/12/06    NON-GU PMH: Spina bifida, unspecified - 09/19/2018 Encounter for general adult medical examination without abnormal findings, Encounter for preventive health examination - Dec 06, 2014 Personal history of other diseases of the circulatory system, History of hypertension - 06-Dec-2014 Personal history of other diseases of the nervous system and sense organs, History of hydrocephalus - 12/06/14 Personal history of other specified (corrected) congenital malformations of nervous system and sense organs, History of spina bifida - 2014-12-06 Personal history of other specified conditions, History of heartburn - 2014-12-06    FAMILY HISTORY: Death - Father Diabetes - Runs In Family Hypertension - Runs In Family Tuberculosis - Runs In Family   SOCIAL HISTORY: Marital Status: Single Preferred Language: English; Ethnicity: Not Hispanic Or Latino; Race: White Current Smoking Status: Patient has never smoked.  Drinks 1 caffeinated drink per day.     Notes: Caffeine use, Never smoker, Alcohol use, Disabled, Single, Number of children, Never used tobacco   REVIEW OF SYSTEMS:    GU Review Male:   Patient denies frequent urination, hard to postpone urination, burning/ pain with urination, get up at night to urinate, leakage of urine, stream starts and stops, trouble starting your stream, have to strain to urinate , erection problems, and penile pain.  Gastrointestinal (Upper):   Patient denies nausea, vomiting, and indigestion/ heartburn.  Gastrointestinal (Lower):  Patient denies diarrhea and constipation.  Constitutional:   Patient denies fever, night  sweats, weight loss, and fatigue.  Skin:   Patient denies skin rash/ lesion and itching.  Eyes:   Patient denies blurred vision and double vision.  Ears/ Nose/ Throat:   Patient denies sore throat and sinus problems.  Hematologic/Lymphatic:   Patient denies swollen glands and easy bruising.  Cardiovascular:   Patient denies leg swelling and chest pains.  Respiratory:   Patient denies cough and shortness of breath.  Endocrine:   Patient denies excessive thirst.  Musculoskeletal:   Patient denies back pain and joint pain.  Neurological:   Patient denies headaches and dizziness.  Psychologic:   Patient denies depression and anxiety.   VITAL SIGNS: None   MULTI-SYSTEM PHYSICAL EXAMINATION:    Constitutional: Obese. 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 Normally developed. Good grooming.   Respiratory: No labored breathing, no use of accessory muscles.   Cardiovascular: Regular rate and rhythm.  Gastrointestinal: Obese with a RLQ colostomy and LLQ SP tube site. Multiple midline scars.      Complexity of Data:  Records Review:   Previous Patient Records   PROCEDURES:         Flexible Cystoscopy - 52000  Risks, benefits, and some of the potential complications of the procedure were discussed. He was given Cipro 500mg  after the procedure.        The suprapubic site was prepped with betadine and I attempted to replace the tube without success. I then passed the flexible scope but was only able to get the catheter in part way. A glidewire was passed and seemed to go into the bladder but I still couldn't advance the scope over the wire. The tract was dilated to 1fr with woven slipover sounds but I was unable to get the catheter or a scope to pass. Subsequently an attempt was made to pass the catheter per urethra but that was also traumatic and unsuccessful.        Complicated Change SP Tube - [id number]  The patient's indwelling SP tube was carefully removed. Attempts a replacement were unsuccessful.  See cysto note.    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 Risks reviewed.   2 NON-GU:   Other mechanical complication of cystostomy catheter, initial encounter - T83.090A Undiagnosed New Problem   PLAN:           Document Letter(s):  Created for Patient: Clinical Summary

## 2019-12-23 NOTE — Op Note (Signed)
Preoperative diagnosis:  1. Neurogenic bladder  Postoperative diagnosis:  1. Neurogenic bladder  Procedure: 1. Attempt at replacing suprapubic tube, flexible cystoscopy with placement of foley over wire  Surgeon: Jacalyn Lefevre, MD  Anesthesia: General  Complications: None  Intraoperative findings:  1. Unable to place SPT over wire along tract 2. Flexible cystoscopy with placement of [id number] silicone over wire  EBL: Minimal  Specimens: None  Indication: [name] is a 39 y.o. patient with NGB managed with SPT.  SPT unable to be placed along his established tract in the office.  After reviewing the management options for treatment, he elected to proceed with the above surgical procedure(s). We have discussed the potential benefits and risks of the procedure, side effects of the proposed treatment, the likelihood of the patient achieving the goals of the procedure, and any potential problems that might occur during the procedure or recuperation. Informed consent has been obtained.  Description of procedure:  The patient was taken to the operating room and general anesthesia was induced.  The patient was placed in the dorsal lithotomy position, prepped and draped in the usual sterile fashion, and preoperative antibiotics were administered. A preoperative time-out was performed.   His SPT tract was prepped in usual sterile fashion.  A sensor wire was easily placed through SPT tract.  Attempts at placing council tip foley (silicone foley with end cut) over wire was unsuccessful with both 16Fr and 18Fr.    Next, urethral meatus was prepped and draped in usual sterile fashion.  Flexible cystoscopy was then performed with small false passage visible.  Visualization was poor but able to scope into bladder.  Sensor wire was then placed through the cystoscope and the scope was removed.  [id number] silicone foley was then placed without difficulty over the wire.  The patient emerged from anesthesia  without difficulty.  Condition following procedure: stable  Follow up: Patient to have follow up with Dr. Gloriann Loan for planning and scheduling of IR to replace SPT.      Jacalyn Lefevre, M.D.

## 2019-12-23 NOTE — Anesthesia Postprocedure Evaluation (Signed)
Anesthesia Post Note  Patient: [name]  Procedure(s) Performed: FLEXIBLE CYSTOSCOPE WITH FOLEY PLACEMENT OVER WIRE. (N/A )     Patient location during evaluation: PACU Anesthesia Type: General Level of consciousness: awake and alert Pain management: pain level controlled Vital Signs Assessment: post-procedure vital signs reviewed and stable Respiratory status: spontaneous breathing, nonlabored ventilation and respiratory function stable Cardiovascular status: blood pressure returned to baseline and stable Postop Assessment: no apparent nausea or vomiting Anesthetic complications: no    Last Vitals:  Vitals:   12/23/19 1438 12/23/19 1800  BP: 124/71 115/72  Pulse: 87 86  Resp: 16 17  Temp: 37.1 C   SpO2: 98% 100%    Last Pain:  Vitals:   12/23/19 1800  TempSrc:   PainSc: 0-No pain                 Catalina Gravel

## 2019-12-23 NOTE — Anesthesia Procedure Notes (Signed)
Procedure Name: LMA Insertion Date/Time: 12/23/2019 5:19 PM Performed by: Claudia Desanctis, CRNA Pre-anesthesia Checklist: Emergency Drugs available, Patient identified, Suction available and Patient being monitored Patient Re-evaluated:Patient Re-evaluated prior to induction Oxygen Delivery Method: Circle system utilized Preoxygenation: Pre-oxygenation with 100% oxygen Induction Type: IV induction Ventilation: Mask ventilation without difficulty LMA: LMA inserted LMA Size: 4.0 Number of attempts: 1 Placement Confirmation: positive ETCO2 and breath sounds checked- equal and bilateral Tube secured with: Tape Dental Injury: Teeth and Oropharynx as per pre-operative assessment

## 2019-12-23 NOTE — Transfer of Care (Signed)
Immediate Anesthesia Transfer of Care Note  Patient: [name]  Procedure(s) Performed: FLEXIBLE CYSTOSCOPE WITH FOLEY PLACEMENT OVER WIRE. (N/A )  Patient Location: PACU  Anesthesia Type:General  Level of Consciousness: awake, alert , oriented and patient cooperative  Airway & Oxygen Therapy: Patient Spontanous Breathing and Patient connected to face mask  Post-op Assessment: Report given to RN and Post -op Vital signs reviewed and stable  Post vital signs: Reviewed and stable  Last Vitals:  Vitals Value Taken Time  BP 115/72 12/23/19 1800  Temp    Pulse 87 12/23/19 1800  Resp 17 12/23/19 1800  SpO2 100 % 12/23/19 1800  Vitals shown include unvalidated device data.  Last Pain:  Vitals:   12/23/19 1442  TempSrc:   PainSc: 5       Patients Stated Pain Goal: 2 ([id number])  Complications: No apparent anesthesia complications

## 2019-12-23 NOTE — H&P (Signed)
CC/HPI: CC: Neurogenic bladder, history of spina bifida  HPI:  39 year old male previously followed by Dr. Risa Grill. He was originally referred for recurrent urinary tract infections in the setting of a history of spina bifida. He has been followed for a few years here and is currently managed with a suprapubic catheter but can void per urethra some. He is for a suprapubic tube replacement today. He has a small capacity bladder with poor compliance and has been getting botox as well.   I was asked to see the patient for SP tube insertion after the nursing staff were unable to pass a new catheter.     ALLERGIES: Cipro TABS Contrast Media Ready-Box MISC Latex Gloves MISC Vancomycin HCl SOLR    MEDICATIONS: Oxybutynin Chloride 5 mg tablet 1 tablet PO TID  Antacid  Calcium + D  Cranberry Fruit  Klor-Con 20 meq packet  Metoprolol Tartrate 50 mg tablet Oral  Multi-Day Vitamins tablet Oral  Rosuvastatin Calcium 20 mg tablet  Tylenol Arthritis PRN  Verapamil Hcl 1 PO Daily  Zofran 4 mg tablet Oral     GU PSH: Catheterization For Collection Of Specimen, Single Patient, All Places Of Service - 11/26/2018, 08/08/2018, 12/27/2017 Catheterize For Residual - 08/08/2018, Dec 09, 2015 Compl Change SP Tube - 04/18/2019 Complex cystometrogram, w/ void pressure and urethral pressure profile studies, any technique - 10/11/2018 Complex Uroflow - 10/11/2018 Cystourethroscopy, W/Injection For Chemodenervation Of Bladder - 11/13/2019, 05/22/2019 Emg surf Electrd - 10/11/2018 Inject For cystogram - 10/11/2018 Intrabd voidng Press - 10/11/2018 Simple Change SP Tube - 11/22/2019, 11/13/2019, 10/18/2019, 09/17/2019, 08/23/2019, 07/19/2019, 06/21/2019, 05/22/2019       PSH Notes: Ileostomy, Colostomy, Cholecystectomy, Appendectomy   NON-GU PSH: Appendectomy - Dec 09, 2014 Cholecystectomy (open) - 12/09/14 Colostomy - 12-09-14     GU PMH: Bladder, Oth neuromuscular dysfunction - 11/26/2018 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, -  10/18/2018 Incomplete bladder emptying - 10/11/2018 Incontinence w/o Sensation - 09/19/2018 Candidal balanitis - Dec 08, 2017 Oth urogenital candidiasis - 08-Dec-2017 Obstructive and reflux uropathy, Unspec - Dec 08, 2017 Bladder, Neuromuscular dysfunction, Unspec, Neurogenic bladder - 09-Dec-2014 Chronic cystitis (w/o hematuria), Chronic cystitis - 12/09/14 Urge incontinence, Urge incontinence of urine - December 09, 2014    NON-GU PMH: Spina bifida, unspecified - 09/19/2018 Encounter for general adult medical examination without abnormal findings, Encounter for preventive health examination - 12-09-2014 Personal history of other diseases of the circulatory system, History of hypertension - Dec 09, 2014 Personal history of other diseases of the nervous system and sense organs, History of hydrocephalus - December 09, 2014 Personal history of other specified (corrected) congenital malformations of nervous system and sense organs, History of spina bifida - December 09, 2014 Personal history of other specified conditions, History of heartburn - 12-09-14    FAMILY HISTORY: Death - Father Diabetes - Runs In Family Hypertension - Runs In Family Tuberculosis - Runs In Family   SOCIAL HISTORY: Marital Status: Single Preferred Language: English; Ethnicity: Not Hispanic Or Latino; Race: White Current Smoking Status: Patient has never smoked.  Drinks 1 caffeinated drink per day.     Notes: Caffeine use, Never smoker, Alcohol use, Disabled, Single, Number of children, Never used tobacco   REVIEW OF SYSTEMS:    GU Review Male:   Patient denies frequent urination, hard to postpone urination, burning/ pain with urination, get up at night to urinate, leakage of urine, stream starts and stops, trouble starting your stream, have to strain to urinate , erection problems, and penile pain.  Gastrointestinal (Upper):   Patient denies nausea, vomiting, and indigestion/ heartburn.  Gastrointestinal (Lower):  Patient denies diarrhea and constipation.  Constitutional:   Patient denies fever, night  sweats, weight loss, and fatigue.  Skin:   Patient denies skin rash/ lesion and itching.  Eyes:   Patient denies blurred vision and double vision.  Ears/ Nose/ Throat:   Patient denies sore throat and sinus problems.  Hematologic/Lymphatic:   Patient denies swollen glands and easy bruising.  Cardiovascular:   Patient denies leg swelling and chest pains.  Respiratory:   Patient denies cough and shortness of breath.  Endocrine:   Patient denies excessive thirst.  Musculoskeletal:   Patient denies back pain and joint pain.  Neurological:   Patient denies headaches and dizziness.  Psychologic:   Patient denies depression and anxiety.   VITAL SIGNS: None   MULTI-SYSTEM PHYSICAL EXAMINATION:    Constitutional: Obese. 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 Normally developed. Good grooming.   Respiratory: No labored breathing, no use of accessory muscles.   Cardiovascular: Regular rate and rhythm.  Gastrointestinal: Obese with a RLQ colostomy and LLQ SP tube site. Multiple midline scars.      Complexity of Data:  Records Review:   Previous Patient Records   PROCEDURES:         Flexible Cystoscopy - 52000  Risks, benefits, and some of the potential complications of the procedure were discussed. He was given Cipro 500mg  after the procedure.        The suprapubic site was prepped with betadine and I attempted to replace the tube without success. I then passed the flexible scope but was only able to get the catheter in part way. A glidewire was passed and seemed to go into the bladder but I still couldn't advance the scope over the wire. The tract was dilated to 39fr with woven slipover sounds but I was unable to get the catheter or a scope to pass. Subsequently an attempt was made to pass the catheter per urethra but that was also traumatic and unsuccessful.        Complicated Change SP Tube - [id number]  The patient's indwelling SP tube was carefully removed. Attempts a replacement were unsuccessful.  See cysto note.    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 Risks reviewed.   2 NON-GU:   Other mechanical complication of cystostomy catheter, initial encounter - T83.090A Undiagnosed New Problem   PLAN:           Document Letter(s):  Created for Patient: Clinical Summary

## 2020-01-08 ENCOUNTER — Other Ambulatory Visit (HOSPITAL_COMMUNITY): Payer: Self-pay | Admitting: Urology

## 2020-01-08 DIAGNOSIS — N319 Neuromuscular dysfunction of bladder, unspecified: Secondary | ICD-10-CM

## 2020-01-16 ENCOUNTER — Other Ambulatory Visit: Payer: Self-pay | Admitting: Student

## 2020-01-17 ENCOUNTER — Ambulatory Visit (HOSPITAL_COMMUNITY)
Admission: RE | Admit: 2020-01-17 | Discharge: 2020-01-17 | Disposition: A | Discharge status: Home / Self Care | Payer: Medicare Other | Source: Ambulatory Visit | Attending: Family Medicine | Admitting: Family Medicine

## 2020-01-17 ENCOUNTER — Ambulatory Visit (HOSPITAL_COMMUNITY)
Admission: RE | Admit: 2020-01-17 | Discharge: 2020-01-17 | Disposition: A | Discharge status: Home / Self Care | Payer: Medicare Other | Source: Ambulatory Visit | Attending: Urology | Admitting: Urology

## 2020-01-17 ENCOUNTER — Encounter (HOSPITAL_COMMUNITY): Payer: Self-pay

## 2020-01-17 ENCOUNTER — Other Ambulatory Visit: Payer: Self-pay

## 2020-01-17 DIAGNOSIS — I10 Essential (primary) hypertension: Secondary | ICD-10-CM | POA: Insufficient documentation

## 2020-01-17 DIAGNOSIS — Z932 Ileostomy status: Secondary | ICD-10-CM | POA: Diagnosis not present

## 2020-01-17 DIAGNOSIS — G808 Other cerebral palsy: Secondary | ICD-10-CM | POA: Insufficient documentation

## 2020-01-17 DIAGNOSIS — Q0703 Arnold-Chiari syndrome with spina bifida and hydrocephalus: Secondary | ICD-10-CM | POA: Diagnosis not present

## 2020-01-17 DIAGNOSIS — N319 Neuromuscular dysfunction of bladder, unspecified: Secondary | ICD-10-CM | POA: Diagnosis present

## 2020-01-17 DIAGNOSIS — Z87440 Personal history of urinary (tract) infections: Secondary | ICD-10-CM | POA: Diagnosis not present

## 2020-01-17 DIAGNOSIS — Z7901 Long term (current) use of anticoagulants: Secondary | ICD-10-CM | POA: Insufficient documentation

## 2020-01-17 DIAGNOSIS — Z960 Presence of urogenital implants: Secondary | ICD-10-CM | POA: Diagnosis not present

## 2020-01-17 DIAGNOSIS — Z982 Presence of cerebrospinal fluid drainage device: Secondary | ICD-10-CM | POA: Insufficient documentation

## 2020-01-17 DIAGNOSIS — R7303 Prediabetes: Secondary | ICD-10-CM | POA: Insufficient documentation

## 2020-01-17 DIAGNOSIS — Z993 Dependence on wheelchair: Secondary | ICD-10-CM | POA: Insufficient documentation

## 2020-01-17 DIAGNOSIS — Z79899 Other long term (current) drug therapy: Secondary | ICD-10-CM | POA: Insufficient documentation

## 2020-01-17 LAB — CBC
HCT: 44.3 % (ref 39.0–52.0)
Hemoglobin: 13.6 g/dL (ref 13.0–17.0)
MCH: 25.2 pg — ABNORMAL LOW (ref 26.0–34.0)
MCHC: 30.7 g/dL (ref 30.0–36.0)
MCV: 82.2 fL (ref 80.0–100.0)
Platelets: 193 10*3/uL (ref 150–400)
RBC: 5.39 MIL/uL (ref 4.22–5.81)
RDW: 15 % (ref 11.5–15.5)
WBC: 7.6 10*3/uL (ref 4.0–10.5)
nRBC: 0 % (ref 0.0–0.2)

## 2020-01-17 LAB — PROTIME-INR
INR: 1 (ref 0.8–1.2)
Prothrombin Time: 13 seconds (ref 11.4–15.2)

## 2020-01-17 MED ORDER — LIDOCAINE-EPINEPHRINE (PF) 1 %-1:200000 IJ SOLN
INTRAMUSCULAR | Status: AC | PRN
  Administered 2020-01-17: 10 mL via INTRADERMAL

## 2020-01-17 MED ORDER — MIDAZOLAM HCL 2 MG/2ML IJ SOLN
INTRAMUSCULAR | Status: AC | PRN
  Administered 2020-01-17: 2 mg via INTRAVENOUS

## 2020-01-17 MED ORDER — CEFAZOLIN SODIUM-DEXTROSE 2-4 GM/100ML-% IV SOLN: 2.0000 g | Freq: Once | INTRAVENOUS | Status: AC

## 2020-01-17 MED ORDER — NALOXONE HCL 0.4 MG/ML IJ SOLN
INTRAMUSCULAR | Status: AC
  Filled 2020-01-17: qty 1

## 2020-01-17 MED ORDER — FLUMAZENIL 0.5 MG/5ML IV SOLN
INTRAVENOUS | Status: AC
  Filled 2020-01-17: qty 5

## 2020-01-17 MED ORDER — SODIUM CHLORIDE 0.9 % IV SOLN: INTRAVENOUS | Status: DC

## 2020-01-17 MED ORDER — CEFAZOLIN SODIUM-DEXTROSE 2-4 GM/100ML-% IV SOLN
INTRAVENOUS | Status: AC
  Administered 2020-01-17: 09:00:00 2 g via INTRAVENOUS
  Filled 2020-01-17: qty 100

## 2020-01-17 MED ORDER — FENTANYL CITRATE (PF) 100 MCG/2ML IJ SOLN
INTRAMUSCULAR | Status: AC | PRN
  Administered 2020-01-17: 50 ug via INTRAVENOUS
  Administered 2020-01-17: 25 ug via INTRAVENOUS

## 2020-01-17 MED ORDER — MIDAZOLAM HCL 2 MG/2ML IJ SOLN
INTRAMUSCULAR | Status: AC
  Filled 2020-01-17: qty 4

## 2020-01-17 MED ORDER — FENTANYL CITRATE (PF) 100 MCG/2ML IJ SOLN
INTRAMUSCULAR | Status: AC
  Filled 2020-01-17: qty 2

## 2020-01-17 NOTE — Sedation Documentation (Signed)
300cc warmed NS instilled into patient's bladder via foley catheter as per Dr Pascal Lux. Patient tolerated procedure well.

## 2020-01-17 NOTE — Procedures (Signed)
Pre procedural Dx: Neurogenic bladder Post procedural Dx: Same  Technically successful [place] and CT guided placed of a 1 Fr drainage catheter placement into the urinary bladder yielding clear urine.    EBL: Trace Complications: None immediate  Ronny Bacon, MD Pager #: [phone]

## 2020-01-17 NOTE — Sedation Documentation (Signed)
Patient is resting comfortably, snoring lightly, in NAD. 

## 2020-01-17 NOTE — H&P (Signed)
Referring Physician(s): Pace,Maryellen D/Bell,E  Supervising Physician: Sandi Mariscal  Patient Status:  [facility]  Chief Complaint: Neurogenic bladder   Subjective: Patient familiar to IR service from multiple suprapubic catheter placements and exchanges, latest on 05/06/2019.  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  He has had recent unsuccessful attempts at exchanges of his suprapubic catheter, latest attempt by urology on 12/23/2019.  He presents today for new suprapubic catheter placement.  He currently has Foley catheter in place.  He denies fever, headache, chest pain, dyspnea, cough, abdominal/back pain, nausea, vomiting or bleeding.  Past Medical History:  Diagnosis Date  . Arnold-Chiari malformation ([place])   . 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 GERD (gastroesophageal reflux disease)   . Heart palpitations    SECONDARY TO VP SHUNT DRAIN  . History of seizure    per pt mother, no seizures since age 77  . Hypertension   . Ileostomy in place [facility]) 2014  . Migraines followed by neurologist @[facility]    taking verapamil  . Neurogenic bladder    INCONTINENT -  . Neurogenic bowel   . Pre-diabetes   . S/P VP shunt   . Spina bifida with hydrocephalus, lumbar region ([place])    CONGENITAL--   VP SHUNT in place--  PARALYZIED MID ABDOMEN DOWN  . Urinary incontinence with continuous leakage   . Wears glasses   . Wheelchair bound    CAN TRASFER SELF WITHOUT BOARD   Past Surgical History:  Procedure Laterality Date  . BACK SURGERY  infant   "get skin to stretch over his back; spinal bifida"  . BACK SURGERY  age 42   scoliosis and  removed hump on his back (kyphosis) and put rods in to stabalize back"  . BOTOX INJECTION N/A 11/14/2018   Procedure: BOTOX INJECTION CYSTOSCOPY;  Surgeon: Lucas Mallow, MD;  Location: [facility];  Service: Urology;  Laterality: N/A;  . BOTOX INJECTION N/A 05/22/2019   Procedure: BOTOX INJECTION;  Surgeon: Lucas Mallow, MD;  Location: [facility];  Service: Urology;  Laterality: N/A;  . BOTOX INJECTION N/A 11/13/2019   Procedure: CYSTOSCOPY BOTOX INJECTION;  Surgeon: Lucas Mallow, MD;  Location: [facility];  Service: Urology;  Laterality: N/A;  . CHOLECYSTECTOMY N/A 07/06/2013   Procedure: LAPAROSCOPIC CHOLECYSTECTOMY WITH INTRAOPERATIVE CHOLANGIOGRAM;  Surgeon: Madilyn Hook, DO;  Location: [facility];  Service: General;  Laterality: N/A;  . COLON RESECTION N/A 07/08/2013   Procedure: EXPLORATORY LAPAROSCOPY, DIAGNOSTIC LAPAROSCOPY, PARTIAL COLECTOMY, ABDOMINAL WASHOUT, ABDOMINAL WOUND VAC;  Surgeon: Madilyn Hook, DO;  Location: [facility];  Service: General;  Laterality: N/A;  . HIP SURGERY Bilateral AS CHILD   TENDON RELEASE  . INCISION AND DRAINAGE OF WOUND N/A 11/04/2013   Procedure: IRRIGATION AND DEBRIDEMENT ABDOMINAL WOUND WITH PLACEMENT OF ACELL/VAC;  Surgeon: Theodoro Kos, DO;  Location: [facility];  Service: Plastics;  Laterality: N/A;  . INSERTION OF SUPRAPUBIC CATHETER N/A 12/23/2019   Procedure: FLEXIBLE CYSTOSCOPE WITH FOLEY PLACEMENT OVER WIRE.;  Surgeon: Robley Fries, MD;  Location: [facility];  Service: Urology;  Laterality: N/A;  . IR CATHETER TUBE CHANGE  02/15/2019  . IR CATHETER TUBE CHANGE  03/01/2019  . IR CATHETER TUBE CHANGE  05/06/2019  . 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 RIGHT COLECTOMY/  APPENDECTOMY/  ILEOSTOMY  NOV 2014  [facility]   surgical complication with cholecystectomy  . UMBILICAL HERNIA REPAIR  child  . VENTRICULOPERITONEAL SHUNT  11-12-2018 per pt and mother---LAST REVISION ~ 06/2015   DUE TO ABD WOUND--- SHUNT DRAINS TO HEART (12/23/2015)      Allergies: Latex, Succinylcholine, Ciprofloxacin, Vancomycin, and Adhesive [tape]  Medications: Prior to Admission medications   Medication  Sig Start Date End Date Taking? Authorizing Provider  acetaminophen (TYLENOL) 650 MG CR tablet Take 1,300 mg by mouth every 8 (eight) hours as needed for pain.   Yes [provider]  calcium-vitamin D (OSCAL 500/200 D-3) 500-200 MG-UNIT tablet Take 1 tablet by mouth every evening.    Yes [provider]  Cranberry-Vitamin C-Vitamin E (CRANBERRY PLUS VITAMIN C) 4200-20-3 MG-MG-UNIT CAPS Take 1 capsule by mouth daily.   Yes [provider]  docusate sodium (COLACE) 100 MG capsule Take 100 mg by mouth daily.    Yes [provider]  KLOR-CON M20 20 MEQ tablet Take 20 mEq by mouth 2 (two) times daily. 10/06/18  Yes [provider]  metoprolol tartrate (LOPRESSOR) 50 MG tablet Take 50 mg by mouth 2 (two) times daily.   Yes [provider]  Multiple Vitamin (MULTIVITAMIN WITH MINERALS) TABS tablet Take 1 tablet by mouth daily.   Yes [provider]  ondansetron (ZOFRAN) 4 MG tablet Take 4 mg by mouth every 8 (eight) hours as needed for nausea or vomiting.   Yes [provider]  oxybutynin (DITROPAN) 5 MG tablet Take 5 mg by mouth in the morning, at noon, and at bedtime.  10/03/19  Yes [provider]  rosuvastatin (CRESTOR) 20 MG tablet Take 20 mg by mouth daily.  09/10/18  Yes [provider]  verapamil (CALAN-SR) 120 MG CR tablet Take 120 mg by mouth at bedtime. 09/26/19  Yes [provider]     Vital Signs: BP 132/72 (BP Location: Right Arm)   Pulse 81   Temp 98.2 F (36.8 C) (Oral)   Resp 18   SpO2 100%   Physical Exam awake, alert.  Chest clear to auscultation bilaterally.  Heart with regular rate and rhythm.  Abdomen soft, positive bowel sounds, right lower quadrant ostomy intact, Foley catheter in place.  Imaging: No results found.  Labs:  CBC: Recent Labs    02/15/19 1015 05/17/19 0952 11/05/19 1033 01/17/20 0800  WBC 9.0 8.0 6.7 7.6  HGB 15.7 15.2 13.0 13.6  HCT 55.8* 49.5 41.5  44.3  PLT 241 209 236 193    COAGS: Recent Labs    02/15/19 1015  INR 0.9    BMP: Recent Labs    02/15/19 1015 02/15/19 1139 05/17/19 0952 11/05/19 1033  NA 138  --  139 142  K 6.6* 5.2* 4.2 3.6  CL 106  --  103 99  CO2 23  --  27 32  GLUCOSE 72  --  101* 102*  BUN 21*  --  12 14  CALCIUM 9.1  --  9.3 9.2  CREATININE 0.67  --  0.37* 0.48*  GFRNONAA >60  --  >60 >60  GFRAA >60  --  >60 >60    LIVER FUNCTION TESTS: No results for input(s): BILITOT, AST, ALT, ALKPHOS, PROT, ALBUMIN in the last 8760 hours.  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  He has had  recent unsuccessful attempts at exchanges of his suprapubic catheter, latest attempt by urology on 12/23/2019.  He presents today for new suprapubic catheter placement.  He currently has Foley catheter in place.  Details/risks of procedure, including but not limited to, internal bleeding, infection, injury to adjacent structures discussed with patient and mother their understanding and consent.   Electronically Signed: D. Rowe Robert, PA-C 01/17/2020, 8:33 AM   I spent a total of 20 minutes at the the patient's bedside AND on the patient's hospital floor or unit, greater than 50% of which was counseling/coordinating care for CT-guided suprapubic catheter placement

## 2020-01-17 NOTE — Discharge Instructions (Signed)
Moderate Conscious Sedation, Adult, Care After These instructions provide you with information about caring for yourself after your procedure. Your health care provider may also give you more specific instructions. Your treatment has been planned according to current medical practices, but problems sometimes occur. Call your health care provider if you have any problems or questions after your procedure. What can I expect after the procedure? After your procedure, it is common:  To feel sleepy for several hours.  To feel clumsy and have poor balance for several hours.  To have poor judgment for several hours.  To vomit if you eat too soon. Follow these instructions at home: For at least 24 hours after the procedure:   Do not: ? Participate in activities where you could fall or become injured. ? Drive. ? Use heavy machinery. ? Drink alcohol. ? Take sleeping pills or medicines that cause drowsiness. ? Make important decisions or sign legal documents. ? Take care of children on your own.  Rest. Eating and drinking  Follow the diet recommended by your health care provider.  If you vomit: ? Drink water, juice, or soup when you can drink without vomiting. ? Make sure you have little or no nausea before eating solid foods. General instructions  Have a responsible adult stay with you until you are awake and alert.  Take over-the-counter and prescription medicines only as told by your health care provider.  If you smoke, do not smoke without supervision.  Keep all follow-up visits as told by your health care provider. This is important. Contact a health care provider if:  You keep feeling nauseous or you keep vomiting.  You feel light-headed.  You develop a rash.  You have a fever. Get help right away if:  You have trouble breathing. This information is not intended to replace advice given to you by your health care provider. Make sure you discuss any questions you have  with your health care provider. Document Revised: 08/11/2017 Document Reviewed: 12/19/2015 Elsevier Patient Education  2020 [employer] Suprapubic Catheter Replacement  Suprapubic catheter replacement is a procedure to remove an old catheter and insert a new, clean catheter. A suprapubic catheter is a rubber tube that drains urine from the bladder into a collection bag outside the body. The catheter is inserted into the bladder through a small opening in the lower abdomen, near the center of the body, above the pubic bone (suprapubicarea). There is a tiny balloon filled with germ-free (sterile) water on the end of the catheter that is in the bladder. The balloon helps to keep the catheter in place. If you need to wear a catheter for a long period of time, you may be instructed to replace the catheter yourself. Usually, suprapubic catheters need to be replaced every 4-6 weeks, or as often as told by your health care provider. What are the risks? Generally, this is a safe procedure. However, problems may occur, including failure to get the catheter into the bladder. What happens before the procedure?  You may have an exam or testing, including a blood or urine sample.  Ask your health care provider what steps will be taken to help prevent infection. What happens during the procedure?  You will lie on your back.  The water from the balloon will be removed using a syringe.  The catheter will be slowly removed.  Lubricant will be applied to the end of the new catheter that will go into your bladder.  The new catheter will be inserted   through the opening in your abdomen. Your health care provider will slide the catheter into your bladder.  Your health care provider will wait for some urine to start flowing through the catheter. When this happens, a syringe will be used to fill the balloon with sterile water.  A collection bag will be attached to the end of the catheter. The procedure may  vary among health care providers and hospitals. What can I expect after procedure? After the procedure, it is common to have:  Some discomfort around the opening in your abdomen. Follow these instructions at home: Caring for the skin around the catheter Use a clean washcloth and soapy water to clean the skin around your catheter every day. Pat the area dry with a clean towel.  Do not pull on the catheter.  Do not use ointment or lotion on this area unless told by your health care provider.  Check the skin around the catheter every day for signs of infection. Check for: ? Redness, swelling, or pain. ? Fluid or blood. ? Warmth. ? Pus or a bad smell. Caring for the catheter  Clean the catheter with soap and water as often as told by your health care provider.  Always make sure there are no twists or curls (kinks) in the catheter.  As soon as you are able to move, you may use a leg bag to collect the urine. ? Make sure that the tubing is straight and without kinks. ? Wrap an ace bandage gently over the tubing and around your leg to minimize the risk of the bag getting pulled out. Emptying the collection bag Empty the large collection bag every 8 hours. Empty the small collection bag when it is about ? full. To empty your large or small collection bag, take the following steps:  Always keep the bag below the level of the catheter. This keeps urine from flowing backward into the catheter.  Hold the bag over the toilet or another container. Turn the valve (spigot) at the bottom of the bag to empty the urine. ? Do not touch the opening of the spigot. ? Do not let the opening touch the toilet or container.  Close the spigot tightly when the bag is empty. Cleaning the collection bag   Wash your hands with soap and water. If soap and water are not available, use hand sanitizer.  Disconnect the bag from the catheter and immediately attach a new bag to the catheter.  Empty the used bag  completely.  Clean the used bag according to the manufacturer's instructions, or as told by your health care provider.  Let the bag dry completely, and put it in a clean plastic bag before storing it. General instructions   Always wash your hands before and after caring for your catheter and collection bag. Use soap and water. If soap and water are not available, use hand sanitizer.  Always make sure there are no leaks in the catheter or collection bag.  Drink enough fluid to keep your urine pale yellow.  If you were prescribed an antibiotic medicine, take it as told by your health care provider. Do not stop taking the antibiotic even if you start to feel better.  Do not take baths, swim, or use a hot tub.  Keep all follow-up visits as told by your health care provider. This is important. Contact a health care provider if:  You leak urine.  You have redness, swelling, or pain around your catheter opening.  You   have fluid or blood coming from your catheter opening.  Your catheter opening feels warm to the touch.  You have pus or a bad smell coming from your catheter opening.  You have a fever or chills.  Your urine flow slows down.  Your urine becomes cloudy or smelly. Get help right away if:  Your catheter comes out.  You feel nauseous.  You have back pain.  You have difficulty changing your catheter.  You have blood in your urine.  You have no urine flow for 1 hour. Summary  Suprapubic catheter replacement is a procedure to remove an old catheter and insert a new, clean catheter.  Make sure that you understand how to care for your catheter, your collection bag, and the opening in your abdomen.  Always wash your hands before and after caring for your catheter and collection bag.  Contact a health care provider if you leak urine or have a fever or any signs of infection around the catheter opening, or if your urine becomes cloudy or smelly.  Get help right  away if your catheter comes out or you have nausea, back pain, blood in your urine, no urine flow for 1 hour, or difficulty changing your catheter. This information is not intended to replace advice given to you by your health care provider. Make sure you discuss any questions you have with your health care provider. Document Revised: 04/18/2018 Document Reviewed: 04/18/2018 Elsevier Patient Education  2020 Elsevier Inc[employer]  

## 2020-01-20 ENCOUNTER — Other Ambulatory Visit (HOSPITAL_COMMUNITY): Payer: Self-pay | Admitting: Interventional Radiology

## 2020-01-20 DIAGNOSIS — N319 Neuromuscular dysfunction of bladder, unspecified: Secondary | ICD-10-CM

## 2020-03-05 ENCOUNTER — Other Ambulatory Visit: Payer: Self-pay | Admitting: Radiology

## 2020-03-06 ENCOUNTER — Ambulatory Visit (HOSPITAL_COMMUNITY)
Admission: RE | Admit: 2020-03-06 | Discharge: 2020-03-06 | Disposition: A | Discharge status: Home / Self Care | Payer: Medicare Other | Source: Ambulatory Visit | Attending: Interventional Radiology | Admitting: Interventional Radiology

## 2020-03-06 ENCOUNTER — Other Ambulatory Visit (HOSPITAL_COMMUNITY): Payer: Self-pay | Admitting: Interventional Radiology

## 2020-03-06 ENCOUNTER — Other Ambulatory Visit: Payer: Self-pay

## 2020-03-06 ENCOUNTER — Encounter (HOSPITAL_COMMUNITY): Payer: Self-pay

## 2020-03-06 DIAGNOSIS — N319 Neuromuscular dysfunction of bladder, unspecified: Secondary | ICD-10-CM

## 2020-03-06 DIAGNOSIS — Z435 Encounter for attention to cystostomy: Secondary | ICD-10-CM | POA: Insufficient documentation

## 2020-03-06 HISTORY — PX: IR CATHETER TUBE CHANGE: IMG717

## 2020-03-06 MED ORDER — IOHEXOL 300 MG/ML  SOLN
50.0000 mL | Freq: Once | INTRAMUSCULAR | Status: AC | PRN
  Administered 2020-03-06: 10 mL

## 2020-03-06 MED ORDER — CEFAZOLIN SODIUM-DEXTROSE 2-4 GM/100ML-% IV SOLN
INTRAVENOUS | Status: AC
  Filled 2020-03-06: qty 100

## 2020-03-06 MED ORDER — MIDAZOLAM HCL 2 MG/2ML IJ SOLN
INTRAMUSCULAR | Status: AC
  Filled 2020-03-06: qty 2

## 2020-03-06 MED ORDER — CEFAZOLIN SODIUM-DEXTROSE 2-4 GM/100ML-% IV SOLN
2.0000 g | INTRAVENOUS | Status: AC
  Administered 2020-03-06: 2 g via INTRAVENOUS

## 2020-03-06 MED ORDER — LIDOCAINE VISCOUS HCL 2 % MT SOLN
OROMUCOSAL | Status: AC | PRN
  Administered 2020-03-06: 15 mL

## 2020-03-06 MED ORDER — FENTANYL CITRATE (PF) 100 MCG/2ML IJ SOLN
INTRAMUSCULAR | Status: AC
  Filled 2020-03-06: qty 2

## 2020-03-06 MED ORDER — SODIUM CHLORIDE 0.9 % IV SOLN: INTRAVENOUS | Status: DC

## 2020-03-06 MED ORDER — LIDOCAINE VISCOUS HCL 2 % MT SOLN
OROMUCOSAL | Status: AC
  Filled 2020-03-06: qty 15

## 2020-03-06 NOTE — Sedation Documentation (Signed)
The patient is tolerating the procedure without complaints of pain or distress. Vitals remain stable.

## 2020-03-06 NOTE — Procedures (Signed)
Pre procedural Dx: Neurogenic Bladder  Post procedural Dx: Same  Technically successful fluoro guided exchange, upsizing and conversion to 16 Fr balloon retention suprapubic catheter.  EBL: Trace Complications: None immediate  Ronny Bacon, MD Pager #: [phone]

## 2020-03-06 NOTE — Discharge Instructions (Signed)
Please call Interventional Radiology clinic [phone] with any questions or concerns.  You may remove your dressing and shower tomorrow.   Suprapubic Catheter Home Guide A suprapubic catheter is a flexible tube that is used to drain urine from the bladder into a collection bag outside the body. The catheter is inserted into the bladder through a small opening in the lower abdomen, above the pubic bone (suprapubic area) and a few inches below your belly button (navel). A tiny balloon filled with germ-free (sterile) water helps to keep the catheter in place. The collection bag must be emptied at least once a day and cleaned at least every other day. The collection bag can be put beside your bed at night and attached to your leg during the day. You may have a large collection bag to use at night and a smaller one to use during the day. Your suprapubic catheter may need to be changed every 4-6 weeks, or as often as recommended by your health care provider. Healing of the tract where the catheter is placed can take 6 weeks to 6 months. During that time, your health care provider may change your catheter. Once the tract is well healed, you or a caregiver will change your suprapubic catheter at home. What are the risks? This catheter is safe to use. However, problems can occur, including:  Blocked urine flow. This can occur if the catheter stops working, or if you have a blood clot in your bladder or in the catheter.  Irritation of the skin around the catheter.  Infection. This can happen if bacteria gets into your bladder. Supplies needed:  Two pairs of sterile gloves.  Paper towels.  Catheter.  Two syringes.  Sterile water.  Sterile cleaning solution.  Lubricant.  Collection bags. How to change the catheter  1. Drink plenty of fluids during the hours before you change the catheter. 2. Wash your hands with soap and water. If soap and water are not available, use hand  sanitizer. 3. Draw up sterile water into a syringe to have ready to fill the new catheter balloon. The amount will depend on the size of the balloon. 4. Have all of your supplies ready and close to you on a paper towel. 5. Lie on your back, sitting slightly upright so that you can see the catheter and opening. 6. Put on sterile gloves. 7. Clean the skin around the catheter opening using the sterile cleaning solution. 8. Remove the water from the balloon in the catheter using a syringe. 9. Slowly remove the catheter. If the catheter seems stuck, or if you have difficulty removing it: ? Do not pull on it. ? Call your health care provider right away. 10. Place the old catheter on a paper towel to discard later. 11. Take off the used gloves, and put on a new pair. 12. Put lubricant on the end of the new catheter that will go into your bladder. 13. Clean the skin around the catheter opening using the sterile cleaning solution. 14. Gently slide the catheter through the opening in your abdomen and into the tract that leads to your bladder. 15. Wait for some urine to start flowing through the catheter. 16. When urine starts to flow through the catheter, attach the collection bag to the end of the catheter. Make sure the connection is tight. 17. Use a syringe to fill the catheter balloon with sterile water. Fill to the amount directed by your health care provider. 18. Remove the gloves and wash  your hands with soap and water. How to care for the skin around the catheter Follow your health care provider's instructions on caring for your skin.  Use a clean washcloth and soapy water to clean the skin around your catheter every day. Pat the area dry with a clean paper towel.  Do not pull on the catheter.  Do not use ointment or lotion on this area, unless told by your health care provider.  Check the skin around the catheter every day for signs of infection. Check for: ? Redness, swelling, or  pain. ? Fluid or blood. ? Warmth. ? Pus or a bad smell. How to empty and clean the collection bag Empty the large collection bag every 8 hours. Empty the small collection bag when it is about ? full. Clean the collection bag every 2-3 days, or as often as told by your health care provider. To do this: 1. Wash your hands with soap and water. If soap and water are not available, use hand sanitizer. 2. Disconnect the bag from the catheter and immediately attach a new bag to the catheter. 3. Hold the used bag over the toilet or another container. 4. Turn the valve (spigot) at the bottom of the bag to empty the urine. Empty the used bag completely. ? Do not touch the opening of the spigot. ? Do not let the opening touch the toilet or container. 5. Close the spigot tightly when the bag is empty. 6. Clean the used bag in one of the following methods: ? According to the manufacturer's instructions. ? As told by your health care provider. 7. Let the bag dry completely. Put it in a clean plastic bag before storing it. General tips   Always wash your hands before and after caring for your catheter and collection bag. Use a mild, fragrance-free soap. If soap and water are not available, use hand sanitizer.  Clean the outside of the catheter with soap and water as often as told by your health care provider.  Always make sure there are no twists or kinks in the catheter tube.  Always make sure there are no leaks in the catheter or collection bag.  Always wear the leg bag below your knee.  Make sure the overnight drainage bag is always lower than the level of your bladder, but do not let it touch the floor. Before you go to sleep, hang the bag inside a wastebasket that is covered by a clean plastic bag.  Drink enough fluid to keep your urine pale yellow.  Do not take baths, swim, or use a hot tub until your health care provider approves. Ask your health care provider if you may take  showers. Contact a heath care provider if:  You leak urine.  You have redness, swelling, or pain around your catheter.  You have fluid or blood coming from your catheter opening.  Your catheter opening feels warm to the touch.  You have pus or a bad smell coming from your catheter opening.  You have a fever or chills.  Your urine flow slows down.  Your urine becomes cloudy or smelly. Get help right away if:  Your catheter comes out.  You have: ? Nausea. ? Back pain. ? Difficulty changing your catheter. ? Blood in your urine. ? No urine flow for 1 hour. Summary  A suprapubic catheter is a flexible tube that is used to drain urine from the bladder into a collection bag outside the body.  Your suprapubic catheter  may need to be changed every 4-6 weeks, or as recommended by your health care provider.  Follow instructions on how to change the catheter and how to empty and clean the collection bag.  Always wash your hands before and after caring for your catheter and collection bag. Drink enough fluid to keep your urine pale yellow.  Get help right away if you have difficulty changing your catheter or if there is blood in your urine. This information is not intended to replace advice given to you by your health care provider. Make sure you discuss any questions you have with your health care provider. Document Revised: 12/20/2018 Document Reviewed: 10/03/2018 Elsevier Patient Education  [place].

## 2020-03-06 NOTE — Sedation Documentation (Signed)
Suprapubic catheter upsize completed as planned. No sedation was required as patient tolerated without pain or distress. The patient was updated by Dr. Pascal Lux and verbalized understanding.

## 2020-03-06 NOTE — H&P (Signed)
Chief Complaint: Patient was seen in consultation today for neurogenic bladder  Referring Physician(s): Dr. Link Snuffer  Supervising Physician: Sandi Mariscal  Patient Status: [facility] - Out-pt  History of Present Illness: [name] is a 39 y.o. 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  He has had attempts at exchanges of his suprapubic catheter with most IR and Urology.  His most recent exchange was 01/16/20 at which time a 12Fr catheter was placed.  He presents to IR for upsize and exchange.  He has been NPO.  He is not on blood thinners.   Past Medical History:  Diagnosis Date  . Arnold-Chiari malformation ([place])   . 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 GERD (gastroesophageal reflux disease)   . Heart palpitations    SECONDARY TO VP SHUNT DRAIN  . History of seizure    per pt mother, no seizures since age 79  . Hypertension   . Ileostomy in place [facility]) 2014  . Migraines followed by neurologist @[facility]    taking verapamil  . Neurogenic bladder    INCONTINENT -  . Neurogenic bowel   . Pre-diabetes   . S/P VP shunt   . Spina bifida with hydrocephalus, lumbar region ([place])    CONGENITAL--   VP SHUNT in place--  PARALYZIED MID ABDOMEN DOWN  . Urinary incontinence with continuous leakage   . Wears glasses   . Wheelchair bound    CAN TRASFER SELF WITHOUT BOARD    Past Surgical History:  Procedure Laterality Date  . BACK SURGERY  infant   "get skin to stretch over his back; spinal bifida"  . BACK SURGERY  age 81   scoliosis and  removed hump on his back (kyphosis) and put rods in to stabalize back"  . BOTOX INJECTION N/A 11/14/2018   Procedure: BOTOX INJECTION CYSTOSCOPY;  Surgeon: Lucas Mallow, MD;  Location: [facility];  Service: Urology;  Laterality: N/A;  . BOTOX INJECTION  N/A 05/22/2019   Procedure: BOTOX INJECTION;  Surgeon: Lucas Mallow, MD;  Location: [facility];  Service: Urology;  Laterality: N/A;  . BOTOX INJECTION N/A 11/13/2019   Procedure: CYSTOSCOPY BOTOX INJECTION;  Surgeon: Lucas Mallow, MD;  Location: [facility];  Service: Urology;  Laterality: N/A;  . CHOLECYSTECTOMY N/A 07/06/2013   Procedure: LAPAROSCOPIC CHOLECYSTECTOMY WITH INTRAOPERATIVE CHOLANGIOGRAM;  Surgeon: Madilyn Hook, DO;  Location: [facility];  Service: General;  Laterality: N/A;  . COLON RESECTION N/A 07/08/2013   Procedure: EXPLORATORY LAPAROSCOPY, DIAGNOSTIC LAPAROSCOPY, PARTIAL COLECTOMY, ABDOMINAL WASHOUT, ABDOMINAL WOUND VAC;  Surgeon: Madilyn Hook, DO;  Location: [facility];  Service: General;  Laterality: N/A;  . HIP SURGERY Bilateral AS CHILD   TENDON RELEASE  . INCISION AND DRAINAGE OF WOUND N/A 11/04/2013   Procedure: IRRIGATION AND DEBRIDEMENT ABDOMINAL WOUND WITH PLACEMENT OF ACELL/VAC;  Surgeon: Theodoro Kos, DO;  Location: [facility];  Service: Plastics;  Laterality: N/A;  . INSERTION OF SUPRAPUBIC CATHETER N/A 12/23/2019   Procedure: FLEXIBLE CYSTOSCOPE WITH FOLEY PLACEMENT OVER WIRE.;  Surgeon: Robley Fries, MD;  Location: [facility];  Service: Urology;  Laterality: N/A;  . IR CATHETER TUBE CHANGE  02/15/2019  . IR CATHETER TUBE CHANGE  03/01/2019  . IR CATHETER TUBE CHANGE  05/06/2019  . 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 RIGHT COLECTOMY/  APPENDECTOMY/  ILEOSTOMY  NOV 2014  [facility]   surgical complication with cholecystectomy  . UMBILICAL HERNIA REPAIR  child  . VENTRICULOPERITONEAL SHUNT  11-12-2018 per pt and mother---LAST REVISION ~ 06/2015   DUE TO ABD WOUND--- SHUNT DRAINS TO HEART (12/23/2015)    Allergies: Latex, Succinylcholine, Ciprofloxacin, Vancomycin, and Adhesive [tape]  Medications: Prior to Admission medications   Medication Sig Start Date End Date Taking? Authorizing Provider  calcium-vitamin D (OSCAL 500/200 D-3)  500-200 MG-UNIT tablet Take 1 tablet by mouth every evening.    Yes [provider]  Cranberry-Vitamin C-Vitamin E (CRANBERRY PLUS VITAMIN C) 4200-20-3 MG-MG-UNIT CAPS Take 1 capsule by mouth daily.   Yes [provider]  docusate sodium (COLACE) 100 MG capsule Take 100 mg by mouth daily.    Yes [provider]  KLOR-CON M20 20 MEQ tablet Take 20 mEq by mouth 2 (two) times daily. 10/06/18  Yes [provider]  metoprolol tartrate (LOPRESSOR) 50 MG tablet Take 50 mg by mouth 2 (two) times daily.   Yes [provider]  Multiple Vitamin (MULTIVITAMIN WITH MINERALS) TABS tablet Take 1 tablet by mouth daily.   Yes [provider]  ondansetron (ZOFRAN) 4 MG tablet Take 4 mg by mouth every 8 (eight) hours as needed for nausea or vomiting.   Yes [provider]  oxybutynin (DITROPAN) 5 MG tablet Take 5 mg by mouth in the morning, at noon, and at bedtime.  10/03/19  Yes [provider]  rosuvastatin (CRESTOR) 20 MG tablet Take 20 mg by mouth daily.  09/10/18  Yes [provider]  verapamil (CALAN-SR) 120 MG CR tablet Take 120 mg by mouth at bedtime. 09/26/19  Yes [provider]  acetaminophen (TYLENOL) 650 MG CR tablet Take 1,300 mg by mouth every 8 (eight) hours as needed for pain.    [provider]     Family History  Problem Relation Age of Onset  . Diabetes Other   . Hypertension Other   . Cancer Other   . Stroke Other     Social History   Socioeconomic History  . Marital status: Single    Spouse name: Not on file  . Number of children: Not on file  . Years of education: Not on file  . Highest education level: Not on file  Occupational History  . Not on file  Tobacco Use  . Smoking status: Never Smoker  . Smokeless tobacco: Never Used  Vaping Use  . Vaping Use: Never used  Substance and Sexual Activity  . Alcohol use: No  . Drug use: Never  . Sexual activity: Never  Other Topics  Concern  . Not on file  Social History Narrative  . Not on file   Social Determinants of Health   Financial Resource Strain:   . Difficulty of Paying Living Expenses:   Food Insecurity:   . Worried About Charity fundraiser in the Last Year:   . Arboriculturist in the Last Year:   Transportation Needs:   . Film/video editor (Medical):   Marland Kitchen Lack of Transportation (Non-Medical):   Physical Activity:   . Days of Exercise per Week:   . Minutes of Exercise per Session:   Stress:   . Feeling of Stress :   Social Connections:   . Frequency of Communication with Friends and Family:   . Frequency of Social Gatherings with Friends and Family:   . Attends Religious Services:   . Active  Member of Clubs or Organizations:   . Attends Archivist Meetings:   Marland Kitchen Marital Status:      Review of Systems: A 12 point ROS discussed and pertinent positives are indicated in the HPI above.  All other systems are negative.  Review of Systems  Constitutional: Negative for fatigue and fever.  Respiratory: Negative for cough and shortness of breath.   Cardiovascular: Negative for chest pain.  Gastrointestinal: Negative for abdominal pain, nausea and vomiting.  Genitourinary: Negative for dysuria.  Musculoskeletal: Negative for back pain.  Psychiatric/Behavioral: Negative for behavioral problems and confusion.    Vital Signs: BP 123/67   Pulse 74   Temp 97.9 F (36.6 C) (Oral)   Resp 16   SpO2 97%   Physical Exam Vitals and nursing note reviewed.  Constitutional:      General: He is not in acute distress.    Appearance: Normal appearance. He is not ill-appearing.  HENT:     Mouth/Throat:     Mouth: Mucous membranes are moist.     Pharynx: Oropharynx is clear.  Cardiovascular:     Rate and Rhythm: Normal rate and regular rhythm.  Pulmonary:     Effort: Pulmonary effort is normal.     Breath sounds: Normal breath sounds.  Abdominal:     General: Abdomen is flat.      Palpations: Abdomen is soft.  Genitourinary:    Comments: Pelvic drain in place.   Moist, but no erythema or purulence.  Skin:    General: Skin is warm.  Neurological:     General: No focal deficit present.     Mental Status: He is alert and oriented to person, place, and time. Mental status is at baseline.  Psychiatric:        Mood and Affect: Mood normal.        Behavior: Behavior normal.        Thought Content: Thought content normal.        Judgment: Judgment normal.      MD Evaluation Airway: WNL Heart: WNL Abdomen: WNL Chest/ Lungs: WNL (spina bifida) ASA  Classification: 3 Mallampati/Airway Score: Three   Imaging: No results found.  Labs:  CBC: Recent Labs    05/17/19 0952 11/05/19 1033 01/17/20 0800  WBC 8.0 6.7 7.6  HGB 15.2 13.0 13.6  HCT 49.5 41.5 44.3  PLT 209 236 193    COAGS: Recent Labs    01/17/20 0741  INR 1.0    BMP: Recent Labs    05/17/19 0952 11/05/19 1033  NA 139 142  K 4.2 3.6  CL 103 99  CO2 27 32  GLUCOSE 101* 102*  BUN 12 14  CALCIUM 9.3 9.2  CREATININE 0.37* 0.48*  GFRNONAA >60 >60  GFRAA >60 >60    LIVER FUNCTION TESTS: No results for input(s): BILITOT, AST, ALT, ALKPHOS, PROT, ALBUMIN in the last 8760 hours.  TUMOR MARKERS: No results for input(s): AFPTM, CEA, CA199, CHROMGRNA in the last 8760 hours.  Assessment and Plan: Patient with past medical history of Arnold-Chiari, spina bifida, neurogenic bladder requiring suprapubic catheter with multiple recent exchanges presents for catheter upsize and exchange.   Case reviewed by Dr. Pascal Lux who approves patient for procedure.  Patient presents today in their usual state of health.  He has been NPO and is not currently on blood thinners.   Risks and benefits discussed with the patient including bleeding, infection, damage to adjacent structures, bowel perforation/fistula connection, and sepsis.  All of the  patient's questions were answered, patient is agreeable  to proceed. Consent signed and in chart.  Thank you for this interesting consult.  I greatly enjoyed meeting [name] and look forward to participating in their care.  A copy of this report was sent to the requesting provider on this date.  Electronically Signed: Docia Barrier, PA 03/06/2020, 12:00 PM   I spent a total of    25 Minutes in face to face in clinical consultation, greater than 50% of which was counseling/coordinating care for neurogenic bladder.

## 2020-04-16 ENCOUNTER — Ambulatory Visit (HOSPITAL_COMMUNITY)
Admission: RE | Admit: 2020-04-16 | Discharge: 2020-04-16 | Disposition: A | Discharge status: Home / Self Care | Payer: Medicare Other | Source: Ambulatory Visit | Attending: Interventional Radiology | Admitting: Interventional Radiology

## 2020-04-16 ENCOUNTER — Other Ambulatory Visit: Payer: Self-pay

## 2020-04-16 DIAGNOSIS — N319 Neuromuscular dysfunction of bladder, unspecified: Secondary | ICD-10-CM | POA: Diagnosis not present

## 2020-04-16 DIAGNOSIS — Z435 Encounter for attention to cystostomy: Secondary | ICD-10-CM | POA: Diagnosis present

## 2020-04-16 HISTORY — PX: IR CATHETER TUBE CHANGE: IMG717

## 2020-04-16 MED ORDER — IOHEXOL 300 MG/ML  SOLN
50.0000 mL | Freq: Once | INTRAMUSCULAR | Status: AC | PRN
  Administered 2020-04-16: 10 mL

## 2020-04-16 MED ORDER — LIDOCAINE VISCOUS HCL 2 % MT SOLN
OROMUCOSAL | Status: AC
  Filled 2020-04-16: qty 15

## 2020-04-16 MED ORDER — LIDOCAINE VISCOUS HCL 2 % MT SOLN
OROMUCOSAL | Status: DC | PRN
  Administered 2020-04-16: 10 mL via OROMUCOSAL

## 2020-04-21 ENCOUNTER — Other Ambulatory Visit (HOSPITAL_COMMUNITY): Payer: Self-pay | Admitting: Interventional Radiology

## 2020-04-21 DIAGNOSIS — N319 Neuromuscular dysfunction of bladder, unspecified: Secondary | ICD-10-CM

## 2020-04-27 ENCOUNTER — Other Ambulatory Visit: Payer: Self-pay

## 2020-04-27 ENCOUNTER — Emergency Department (HOSPITAL_COMMUNITY)
Admission: EM | Admit: 2020-04-27 | Discharge: 2020-04-27 | Discharge status: Absent without leave | Payer: Medicare Other

## 2020-04-29 ENCOUNTER — Ambulatory Visit (HOSPITAL_COMMUNITY)
Admission: RE | Admit: 2020-04-29 | Discharge: 2020-04-29 | Disposition: A | Discharge status: Home / Self Care | Payer: Medicare Other | Source: Ambulatory Visit | Attending: Interventional Radiology | Admitting: Interventional Radiology

## 2020-04-29 ENCOUNTER — Other Ambulatory Visit: Payer: Self-pay

## 2020-04-29 DIAGNOSIS — Z466 Encounter for fitting and adjustment of urinary device: Secondary | ICD-10-CM | POA: Diagnosis present

## 2020-04-29 DIAGNOSIS — N319 Neuromuscular dysfunction of bladder, unspecified: Secondary | ICD-10-CM | POA: Diagnosis not present

## 2020-04-29 HISTORY — PX: IR CATHETER TUBE CHANGE: IMG717

## 2020-04-29 MED ORDER — IOHEXOL 300 MG/ML  SOLN
50.0000 mL | Freq: Once | INTRAMUSCULAR | Status: AC | PRN
  Administered 2020-04-29: 10 mL

## 2020-04-29 NOTE — Progress Notes (Signed)
Suprapubic catheter successfully replaced under fluoroscopy.  Foley catheter removed in IR suite removed per Patient's and Patient's  Mother's request at this time. 10 ml of NS removed from balloon retention device. Catheter removed without difficulty intact.

## 2020-04-29 NOTE — H&P (Addendum)
Chief Complaint: Neurogenic bladder. Replacement Suprapubic catheter.  Referring Physician(s): Dr. Phillis Knack  Supervising Physician: Jacqulynn Cadet  Patient Status: [name]  History of Present Illness: [name] is a 39 y.o. male Known to IR. 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 IR initially placed a suprapubic catheter on 4.10.20 last upsize and exchange was performed on 8.5.21 to an 18 Fr.   Past Medical History:  Diagnosis Date  . Arnold-Chiari malformation ([place])   . 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 GERD (gastroesophageal reflux disease)   . Heart palpitations    SECONDARY TO VP SHUNT DRAIN  . History of seizure    per pt mother, no seizures since age 49  . Hypertension   . Ileostomy in place [facility]) 2014  . Migraines followed by neurologist @[facility]    taking verapamil  . Neurogenic bladder    INCONTINENT -  . Neurogenic bowel   . Pre-diabetes   . S/P VP shunt   . Spina bifida with hydrocephalus, lumbar region ([place])    CONGENITAL--   VP SHUNT in place--  PARALYZIED MID ABDOMEN DOWN  . Urinary incontinence with continuous leakage   . Wears glasses   . Wheelchair bound    CAN TRASFER SELF WITHOUT BOARD    Past Surgical History:  Procedure Laterality Date  . BACK SURGERY  infant   "get skin to stretch over his back; spinal bifida"  . BACK SURGERY  age 63   scoliosis and  removed hump on his back (kyphosis) and put rods in to stabalize back"  . BOTOX INJECTION N/A 11/14/2018   Procedure: BOTOX INJECTION CYSTOSCOPY;  Surgeon: Lucas Mallow, MD;  Location: [facility];  Service: Urology;  Laterality: N/A;  . BOTOX INJECTION N/A 05/22/2019   Procedure: BOTOX INJECTION;  Surgeon: Lucas Mallow, MD;  Location: [facility];  Service: Urology;  Laterality: N/A;  . BOTOX INJECTION N/A 11/13/2019   Procedure:  CYSTOSCOPY BOTOX INJECTION;  Surgeon: Lucas Mallow, MD;  Location: [facility];  Service: Urology;  Laterality: N/A;  . CHOLECYSTECTOMY N/A 07/06/2013   Procedure: LAPAROSCOPIC CHOLECYSTECTOMY WITH INTRAOPERATIVE CHOLANGIOGRAM;  Surgeon: Madilyn Hook, DO;  Location: [facility];  Service: General;  Laterality: N/A;  . COLON RESECTION N/A 07/08/2013   Procedure: EXPLORATORY LAPAROSCOPY, DIAGNOSTIC LAPAROSCOPY, PARTIAL COLECTOMY, ABDOMINAL WASHOUT, ABDOMINAL WOUND VAC;  Surgeon: Madilyn Hook, DO;  Location: [facility];  Service: General;  Laterality: N/A;  . HIP SURGERY Bilateral AS CHILD   TENDON RELEASE  . INCISION AND DRAINAGE OF WOUND N/A 11/04/2013   Procedure: IRRIGATION AND DEBRIDEMENT ABDOMINAL WOUND WITH PLACEMENT OF ACELL/VAC;  Surgeon: Theodoro Kos, DO;  Location: [place];  Service: Plastics;  Laterality: N/A;  . INSERTION OF SUPRAPUBIC CATHETER N/A 12/23/2019   Procedure: FLEXIBLE CYSTOSCOPE WITH FOLEY PLACEMENT OVER WIRE.;  Surgeon: Robley Fries, MD;  Location: [facility];  Service: Urology;  Laterality: N/A;  . IR CATHETER TUBE CHANGE  02/15/2019  . IR CATHETER TUBE CHANGE  03/01/2019  . IR CATHETER TUBE CHANGE  05/06/2019  . IR CATHETER TUBE CHANGE  03/06/2020  . IR CATHETER TUBE CHANGE  04/16/2020  . 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 RIGHT COLECTOMY/  APPENDECTOMY/  ILEOSTOMY  NOV 2014  [facility]   surgical complication with cholecystectomy  . UMBILICAL HERNIA REPAIR  child  . VENTRICULOPERITONEAL SHUNT  11-12-2018 per pt and mother---LAST REVISION ~ 06/2015   DUE TO ABD WOUND--- SHUNT DRAINS TO HEART (12/23/2015)    Allergies: Latex, Succinylcholine, Ciprofloxacin, Vancomycin, and Adhesive [tape]  Medications: Prior to Admission medications   Medication Sig Start Date End Date Taking? Authorizing Provider  acetaminophen (TYLENOL) 650 MG CR tablet Take 1,300 mg by mouth every 8 (eight) hours as needed for pain.    [provider]    calcium-vitamin D (OSCAL 500/200 D-3) 500-200 MG-UNIT tablet Take 1 tablet by mouth every evening.     [provider]  Cranberry-Vitamin C-Vitamin E (CRANBERRY PLUS VITAMIN C) 4200-20-3 MG-MG-UNIT CAPS Take 1 capsule by mouth daily.    [provider]  docusate sodium (COLACE) 100 MG capsule Take 100 mg by mouth daily.     [provider]  KLOR-CON M20 20 MEQ tablet Take 20 mEq by mouth 2 (two) times daily. 10/06/18   [provider]  metoprolol tartrate (LOPRESSOR) 50 MG tablet Take 50 mg by mouth 2 (two) times daily.    [provider]  Multiple Vitamin (MULTIVITAMIN WITH MINERALS) TABS tablet Take 1 tablet by mouth daily.    [provider]  ondansetron (ZOFRAN) 4 MG tablet Take 4 mg by mouth every 8 (eight) hours as needed for nausea or vomiting.    [provider]  oxybutynin (DITROPAN) 5 MG tablet Take 5 mg by mouth in the morning, at noon, and at bedtime.  10/03/19   [provider]  rosuvastatin (CRESTOR) 20 MG tablet Take 20 mg by mouth daily.  09/10/18   [provider]  verapamil (CALAN-SR) 120 MG CR tablet Take 120 mg by mouth at bedtime. 09/26/19   [provider]     Family History  Problem Relation Age of Onset  . Diabetes Other   . Hypertension Other   . Cancer Other   . Stroke Other     Social History   Socioeconomic History  . Marital status: Single    Spouse name: Not on file  . Number of children: Not on file  . Years of education: Not on file  . Highest education level: Not on file  Occupational History  . Not on file  Tobacco Use  . Smoking status: Never Smoker  . Smokeless tobacco: Never Used  Vaping Use  . Vaping Use: Never used  Substance and Sexual Activity  . Alcohol use: No  . Drug use: Never  . Sexual activity: Never  Other Topics Concern  . Not on file  Social History Narrative  . Not on file   Social Determinants of Health   Financial Resource Strain:    . Difficulty of Paying Living Expenses:   Food Insecurity:   . Worried About Charity fundraiser in the Last Year:   . Arboriculturist in the Last Year:   Transportation Needs:   . Film/video editor (Medical):   Marland Kitchen Lack of Transportation (Non-Medical):   Physical Activity:   . Days of Exercise per Week:   . Minutes of Exercise per Session:   Stress:   . Feeling of Stress :   Social Connections:   . Frequency of Communication with Friends and Family:   . Frequency of Social Gatherings with Friends and Family:   . Attends Religious Services:   . Active Member of Clubs or Organizations:   . Attends Archivist Meetings:   Marland Kitchen Marital Status:  Review of Systems: A 12 point ROS discussed and pertinent positives are indicated in the HPI above.  All other systems are negative.  Review of Systems  Constitutional: Negative for fever.  HENT: Negative for congestion.   Respiratory: Negative for cough and shortness of breath.   Cardiovascular: Negative for chest pain.  Gastrointestinal: Negative for abdominal pain.  Neurological: Negative for headaches.  Psychiatric/Behavioral: Negative for behavioral problems and confusion.    Vital Signs: There were no vitals taken for this visit.  Physical Exam Vitals and nursing note reviewed.  Constitutional:      Appearance: He is well-developed.  HENT:     Head: Normocephalic.  Pulmonary:     Effort: Pulmonary effort is normal.  Genitourinary:    Comments: Foley cathter in place Musculoskeletal:        General: Normal range of motion.     Cervical back: Normal range of motion.  Skin:    General: Skin is dry.     Comments: Skin breakdown noted to genitalia and buttocks.  Neurological:     Mental Status: He is alert and oriented to person, place, and time.     Imaging: IR Catheter Tube Change  Result Date: 04/16/2020 INDICATION: 39 year old male with spina bifida, paraplegia and neurogenic bladder with suprapubic  catheter in place. He presents for tube exchange and up size. EXAM: IR CATHETER TUBE CHANGE COMPARISON:  None. MEDICATIONS: None ANESTHESIA/SEDATION: None CONTRAST:  10 mL Omnipaque 300-administered into the collecting system(s) FLUOROSCOPY TIME:  Fluoroscopy Time: 1 minutes 6 seconds (48 mGy). COMPLICATIONS: None immediate. PROCEDURE: Informed written consent was obtained from the patient after a thorough discussion of the procedural risks, benefits and alternatives. All questions were addressed. Maximal Sterile Barrier Technique was utilized including caps, mask, sterile gowns, sterile gloves, sterile drape, hand hygiene and skin antiseptic. A timeout was performed prior to the initiation of the procedure. Contrast was injected through the existing 53 French gastrostomy tube which is serving as a suprapubic catheter. The tube is well positioned in the urinary bladder. The balloon could not be successfully deflated using the standard technique, so the tube was transected and the balloon allowed to decompress. The tube was then removed over a wire. The skin tract was dilated to 18 [place]. An 91 French Foley catheter was then advanced over the wire and into the bladder. The retention balloon was filled with 25 mL saline. Drainage is excellent. The catheter was connected to a Foley bag. IMPRESSION: Successful exchange and up size of suprapubic catheter to an 18 French Foley catheter. Electronically Signed   By: Jacqulynn Cadet M.D.   On: 04/16/2020 14:10    Labs:  CBC: Recent Labs    05/17/19 0952 11/05/19 1033 01/17/20 0800  WBC 8.0 6.7 7.6  HGB 15.2 13.0 13.6  HCT 49.5 41.5 44.3  PLT 209 236 193    COAGS: Recent Labs    01/17/20 0741  INR 1.0    BMP: Recent Labs    05/17/19 0952 11/05/19 1033  NA 139 142  K 4.2 3.6  CL 103 99  CO2 27 32  GLUCOSE 101* 102*  BUN 12 14  CALCIUM 9.3 9.2  CREATININE 0.37* 0.48*  GFRNONAA >60 >60  GFRAA >60 >60    Assessment and Plan:  39 y.o,  male outpatient. Known to IR. 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 IR initially placed a suprapubic catheter on 4.10.20  last upsize and exchange was performed on 8.5.21 to an 18 Fr.  Patient presented to IR on 8.15.21 after his suprapubic catheter had fallen out. Urology placed a foley catheter until patient could be seen in IR for replacement. Patient is here for a suprapubic catheter replacement.  Patient is allergic to latex, tape, vancomycin and ciprofloxacin. No recent labs and medications are within acceptable parameters.   Risks and benefits discussed with the patient including bleeding, infection, damage to adjacent structures, bowel perforation/fistula connection, and sepsis.  All of the patient's questions were answered, patient is agreeable to proceed.  Consent signed and in chart.   Thank you for this interesting consult.  I greatly enjoyed meeting [name] and look forward to participating in their care.  A copy of this report was sent to the requesting provider on this date.  Electronically Signed: Jacqualine Mau, NP 04/29/2020, 2:39 PM   I spent a total of    15 Minutes in face to face in clinical consultation, greater than 50% of which was counseling/coordinating care for suprapubic catheter replacement

## 2020-05-20 ENCOUNTER — Ambulatory Visit: Payer: Medicare Other | Admitting: Family Medicine

## 2020-06-11 ENCOUNTER — Other Ambulatory Visit: Payer: Self-pay | Admitting: Urology

## 2020-06-18 ENCOUNTER — Other Ambulatory Visit: Payer: Self-pay | Admitting: Urology

## 2020-06-23 NOTE — Progress Notes (Addendum)
COVID Vaccine Completed: x1 Date COVID Vaccine completed:  04-22-20 COVID vaccine manufacturer: [place]   PCP - Buzzy Han, MD Cardiologist -   Chest x-ray -  EKG -To be done day of surgery per Janett Billow Stress Test -  ECHO -  Cardiac Cath -  Pacemaker/ICD device last checked:  Sleep Study -  CPAP -   Fasting Blood Sugar -  Checks Blood Sugar _____ times a day  Blood Thinner Instructions: Aspirin Instructions: Last Dose:  Anesthesia review:  Spina Bifida  Patient denies shortness of breath, fever, cough and chest pain at PAT appointment   Patient verbalized understanding of instructions that were given to them at the PAT appointment. Patient was also instructed that they will need to review over the PAT instructions again at home before surgery.

## 2020-06-23 NOTE — Patient Instructions (Addendum)
DUE TO COVID-19 ONLY ONE VISITOR IS ALLOWED TO COME WITH YOU AND STAY IN THE WAITING ROOM ONLY  DURING PRE OP AND PROCEDURE.   IF YOU WILL BE ADMITTED INTO THE HOSPITAL YOU ARE ALLOWED ONE SUPPORT PERSON DURING VISITATION  HOURS ONLY (10AM -8PM)   . The support person may change daily. . The support person must pass our screening, gel in and out, and wear a mask at all times, including in the patient's room. . Patients must also wear a mask when staff or their support person are in the room.          Your procedure is scheduled on:  Wednesday, 07-01-20   Report to [facility] Main  Entrance    Report to admitting at 7:30 AM   Call this number if you have problems the morning of surgery [phone]   Do not eat food :After Midnight.   May have liquids until 7:30 AM  day of surgery  CLEAR LIQUID DIET  Foods Allowed                                                                     Foods Excluded  Water, Black Coffee and tea, regular and decaf            liquids that you cannot  Plain Jell-O in any flavor  (No red)                                   see through such as: Fruit ices (not with fruit pulp)                                      milk, soups, orange juice              Iced Popsicles (No red)                                      All solid food                                   Apple juices Sports drinks like Gatorade (No red) Lightly seasoned clear broth or consume(fat free) Sugar, honey syrup    Oral Hygiene is also important to reduce your risk of infection.                                    Remember - BRUSH YOUR TEETH THE MORNING OF SURGERY WITH YOUR REGULAR TOOTHPASTE   Do NOT smoke after Midnight   Take these medicines the morning of surgery with A SIP OF WATER:  Metoprolol, Verapamil                                You may not have any metal on your body including jewelry,  and body piercings             Do not  lotions, powders, perfumes/cologne, or  deodorant   Men may shave face and neck.   Do not bring valuables to the hospital. [place].   Contacts, dentures or bridgework may not be worn into surgery.    Patients discharged the day of surgery will not be allowed to drive home.               Please read over the following fact sheets you were given: IF YOU HAVE QUESTIONS ABOUT YOUR PRE OP INSTRUCTIONS PLEASE CALL [phone]   [facility] - Preparing for Surgery Before surgery, you can play an important role.  Because skin is not sterile, your skin needs to be as free of germs as possible.  You can reduce the number of germs on your skin by washing with CHG (chlorahexidine gluconate) soap before surgery.  CHG is an antiseptic cleaner which kills germs and bonds with the skin to continue killing germs even after washing. Please DO NOT use if you have an allergy to CHG or antibacterial soaps.  If your skin becomes reddened/irritated stop using the CHG and inform your nurse when you arrive at Short Stay. Do not shave (including legs and underarms) for at least 48 hours prior to the first CHG shower.  You may shave your face/neck.  Please follow these instructions carefully:  1.  Shower with CHG Soap the night before surgery and the  morning of surgery.  2.  If you choose to wash your hair, wash your hair first as usual with your normal  shampoo.  3.  After you shampoo, rinse your hair and body thoroughly to remove the shampoo.                             4.  Use CHG as you would any other liquid soap.  You can apply chg directly to the skin and wash.  Gently with a scrungie or clean washcloth.  5.  Apply the CHG Soap to your body ONLY FROM THE NECK DOWN.   Do   not use on face/ open                           Wound or open sores. Avoid contact with eyes, ears mouth and   genitals (private parts).                       Wash face,  Genitals (private parts) with your normal soap.             6.  Wash  thoroughly, paying special attention to the area where your    surgery  will be performed.  7.  Thoroughly rinse your body with warm water from the neck down.  8.  DO NOT shower/wash with your normal soap after using and rinsing off the CHG Soap.                9.  Pat yourself dry with a clean towel.            10.  Wear clean pajamas.            11.  Place clean sheets on your bed the night of your first shower and do not  sleep with pets.  Day of Surgery : Do not apply any lotions/deodorants the morning of surgery.  Please wear clean clothes to the hospital/surgery center.  FAILURE TO FOLLOW THESE INSTRUCTIONS MAY RESULT IN THE CANCELLATION OF YOUR SURGERY  PATIENT SIGNATURE_________________________________  NURSE SIGNATURE__________________________________  ________________________________________________________________________

## 2020-06-24 ENCOUNTER — Encounter (HOSPITAL_COMMUNITY): Payer: Self-pay

## 2020-06-24 ENCOUNTER — Other Ambulatory Visit: Payer: Self-pay

## 2020-06-24 ENCOUNTER — Encounter (HOSPITAL_COMMUNITY)
Admission: RE | Admit: 2020-06-24 | Discharge: 2020-06-24 | Disposition: A | Discharge status: Home / Self Care | Payer: Medicare Other | Source: Ambulatory Visit | Attending: Urology | Admitting: Urology

## 2020-06-24 DIAGNOSIS — Z01818 Encounter for other preprocedural examination: Secondary | ICD-10-CM | POA: Diagnosis not present

## 2020-06-24 LAB — BASIC METABOLIC PANEL
Anion gap: 10 (ref 5–15)
BUN: 12 mg/dL (ref 6–20)
CO2: 32 mmol/L (ref 22–32)
Calcium: 9.5 mg/dL (ref 8.9–10.3)
Chloride: 99 mmol/L (ref 98–111)
Creatinine, Ser: 0.63 mg/dL (ref 0.61–1.24)
GFR, Estimated: >60 mL/min (ref >60)
Glucose, Bld: 100 mg/dL — ABNORMAL HIGH (ref 70–99)
Potassium: 3.8 mmol/L (ref 3.5–5.1)
Sodium: 141 mmol/L (ref 135–145)

## 2020-06-24 LAB — CBC
HCT: 45.9 % (ref 39.0–52.0)
Hemoglobin: 14.4 g/dL (ref 13.0–17.0)
MCH: 25 pg — ABNORMAL LOW (ref 26.0–34.0)
MCHC: 31.4 g/dL (ref 30.0–36.0)
MCV: 79.8 fL — ABNORMAL LOW (ref 80.0–100.0)
Platelets: 182 10*3/uL (ref 150–400)
RBC: 5.75 MIL/uL (ref 4.22–5.81)
RDW: 15.4 % (ref 11.5–15.5)
WBC: 8.3 10*3/uL (ref 4.0–10.5)
nRBC: 0 % (ref 0.0–0.2)

## 2020-06-24 NOTE — Progress Notes (Signed)
Patient uses bus for transportation and bus will not take patient to testing site in [place], and he has no other means of transportation.  Discussed with Konrad Felix, PA-C and patient is to arrive 3 hours prior to OR time to get rapid Covid test.

## 2020-06-25 LAB — HEMOGLOBIN A1C
Hgb A1c MFr Bld: 5.7 % — ABNORMAL HIGH (ref 4.8–5.6)
Mean Plasma Glucose: 117 mg/dL

## 2020-06-30 NOTE — Anesthesia Preprocedure Evaluation (Addendum)
Anesthesia Evaluation  Patient identified by MRN, date of birth, ID band Patient awake    Reviewed: Allergy & Precautions, NPO status , Patient's Chart, lab work & pertinent test results  Airway Mallampati: II  TM Distance: >3 FB Neck ROM: Full    Dental  (+) Poor Dentition, Missing   Pulmonary neg pulmonary ROS,    Pulmonary exam normal breath sounds clear to auscultation       Cardiovascular hypertension, Pt. on home beta blockers and Pt. on medications Normal cardiovascular exam Rhythm:Regular Rate:Normal  ECG: NSR, rate 80   Neuro/Psych  Headaches, Spina bifida with hydrocephalus, lumbar region Congenital paraplegia  Neuromuscular disease negative psych ROS   GI/Hepatic Neg liver ROS, Ileostomy in place   Endo/Other  negative endocrine ROS  Renal/GU negative Renal ROS     Musculoskeletal Wheelchair bound   Abdominal (+) + obese,   Peds  Hematology HLD   Anesthesia Other Findings NEUROGENIC BLADDER  Reproductive/Obstetrics                            Anesthesia Physical Anesthesia Plan  ASA: IV  Anesthesia Plan: MAC   Post-op Pain Management:    Induction: Intravenous  PONV Risk Score and Plan: 1 and Propofol infusion and Treatment may vary due to age or medical condition  Airway Management Planned: Simple Face Mask  Additional Equipment:   Intra-op Plan:   Post-operative Plan:   Informed Consent: I have reviewed the patients History and Physical, chart, labs and discussed the procedure including the risks, benefits and alternatives for the proposed anesthesia with the patient or authorized representative who has indicated his/her understanding and acceptance.     Dental advisory given  Plan Discussed with: CRNA  Anesthesia Plan Comments:        Anesthesia Quick Evaluation

## 2020-07-01 ENCOUNTER — Ambulatory Visit (HOSPITAL_COMMUNITY): Payer: Medicare Other | Admitting: Anesthesiology

## 2020-07-01 ENCOUNTER — Ambulatory Visit (HOSPITAL_COMMUNITY): Payer: Medicare Other | Admitting: Physician Assistant

## 2020-07-01 ENCOUNTER — Ambulatory Visit (HOSPITAL_COMMUNITY)
Admission: RE | Admit: 2020-07-01 | Discharge: 2020-07-01 | Disposition: A | Discharge status: Home / Self Care | Payer: Medicare Other | Source: Ambulatory Visit | Attending: Urology | Admitting: Urology

## 2020-07-01 ENCOUNTER — Encounter (HOSPITAL_COMMUNITY): Payer: Self-pay | Admitting: Urology

## 2020-07-01 ENCOUNTER — Encounter (HOSPITAL_COMMUNITY)
Admission: RE | Disposition: A | Discharge status: Home / Self Care | Payer: Self-pay | Source: Ambulatory Visit | Attending: Urology

## 2020-07-01 DIAGNOSIS — N318 Other neuromuscular dysfunction of bladder: Secondary | ICD-10-CM | POA: Insufficient documentation

## 2020-07-01 DIAGNOSIS — N210 Calculus in bladder: Secondary | ICD-10-CM | POA: Diagnosis not present

## 2020-07-01 DIAGNOSIS — G43909 Migraine, unspecified, not intractable, without status migrainosus: Secondary | ICD-10-CM | POA: Diagnosis not present

## 2020-07-01 DIAGNOSIS — Z888 Allergy status to other drugs, medicaments and biological substances status: Secondary | ICD-10-CM | POA: Diagnosis not present

## 2020-07-01 DIAGNOSIS — Z809 Family history of malignant neoplasm, unspecified: Secondary | ICD-10-CM | POA: Insufficient documentation

## 2020-07-01 DIAGNOSIS — K219 Gastro-esophageal reflux disease without esophagitis: Secondary | ICD-10-CM | POA: Diagnosis not present

## 2020-07-01 DIAGNOSIS — Z6831 Body mass index (BMI) 31.0-31.9, adult: Secondary | ICD-10-CM | POA: Diagnosis not present

## 2020-07-01 DIAGNOSIS — Z932 Ileostomy status: Secondary | ICD-10-CM | POA: Diagnosis not present

## 2020-07-01 DIAGNOSIS — Z20822 Contact with and (suspected) exposure to covid-19: Secondary | ICD-10-CM | POA: Insufficient documentation

## 2020-07-01 DIAGNOSIS — Z9119 Patient's noncompliance with other medical treatment and regimen: Secondary | ICD-10-CM | POA: Insufficient documentation

## 2020-07-01 DIAGNOSIS — Z8249 Family history of ischemic heart disease and other diseases of the circulatory system: Secondary | ICD-10-CM | POA: Insufficient documentation

## 2020-07-01 DIAGNOSIS — G808 Other cerebral palsy: Secondary | ICD-10-CM | POA: Diagnosis not present

## 2020-07-01 DIAGNOSIS — Z993 Dependence on wheelchair: Secondary | ICD-10-CM | POA: Diagnosis not present

## 2020-07-01 DIAGNOSIS — N3941 Urge incontinence: Secondary | ICD-10-CM | POA: Diagnosis not present

## 2020-07-01 DIAGNOSIS — Z833 Family history of diabetes mellitus: Secondary | ICD-10-CM | POA: Insufficient documentation

## 2020-07-01 DIAGNOSIS — Z91048 Other nonmedicinal substance allergy status: Secondary | ICD-10-CM | POA: Diagnosis not present

## 2020-07-01 DIAGNOSIS — Z881 Allergy status to other antibiotic agents status: Secondary | ICD-10-CM | POA: Insufficient documentation

## 2020-07-01 DIAGNOSIS — Z91040 Latex allergy status: Secondary | ICD-10-CM | POA: Insufficient documentation

## 2020-07-01 DIAGNOSIS — Z823 Family history of stroke: Secondary | ICD-10-CM | POA: Insufficient documentation

## 2020-07-01 DIAGNOSIS — R7303 Prediabetes: Secondary | ICD-10-CM | POA: Diagnosis not present

## 2020-07-01 DIAGNOSIS — Z9049 Acquired absence of other specified parts of digestive tract: Secondary | ICD-10-CM | POA: Insufficient documentation

## 2020-07-01 DIAGNOSIS — E785 Hyperlipidemia, unspecified: Secondary | ICD-10-CM | POA: Diagnosis not present

## 2020-07-01 DIAGNOSIS — Q052 Lumbar spina bifida with hydrocephalus: Secondary | ICD-10-CM | POA: Insufficient documentation

## 2020-07-01 DIAGNOSIS — I10 Essential (primary) hypertension: Secondary | ICD-10-CM | POA: Insufficient documentation

## 2020-07-01 DIAGNOSIS — Z982 Presence of cerebrospinal fluid drainage device: Secondary | ICD-10-CM | POA: Diagnosis not present

## 2020-07-01 DIAGNOSIS — E669 Obesity, unspecified: Secondary | ICD-10-CM | POA: Insufficient documentation

## 2020-07-01 HISTORY — PX: HOLMIUM LASER APPLICATION: SHX5852

## 2020-07-01 HISTORY — PX: BOTOX INJECTION: SHX5754

## 2020-07-01 LAB — RESPIRATORY PANEL BY RT PCR (FLU A&B, COVID)
Influenza A by PCR: NEGATIVE
Influenza B by PCR: NEGATIVE
SARS Coronavirus 2 by RT PCR: NEGATIVE

## 2020-07-01 SURGERY — BOTOX INJECTION
Anesthesia: Monitor Anesthesia Care | Site: Bladder

## 2020-07-01 MED ORDER — SODIUM CHLORIDE (PF) 0.9 % IJ SOLN
INTRAMUSCULAR | Status: AC
  Filled 2020-07-01: qty 50

## 2020-07-01 MED ORDER — CEPHALEXIN 500 MG PO CAPS: 500.0000 mg | ORAL_CAPSULE | Freq: Two times a day (BID) | ORAL | 0 refills | Status: AC

## 2020-07-01 MED ORDER — ORAL CARE MOUTH RINSE: 15.0000 mL | Freq: Once | OROMUCOSAL | Status: AC

## 2020-07-01 MED ORDER — SODIUM CHLORIDE (PF) 0.9 % IJ SOLN
INTRAMUSCULAR | Status: DC | PRN
  Administered 2020-07-01: 30 mL

## 2020-07-01 MED ORDER — FENTANYL CITRATE (PF) 100 MCG/2ML IJ SOLN
INTRAMUSCULAR | Status: AC
  Filled 2020-07-01: qty 2

## 2020-07-01 MED ORDER — PROPOFOL 500 MG/50ML IV EMUL
INTRAVENOUS | Status: DC | PRN
  Administered 2020-07-01: 50 ug/kg/min via INTRAVENOUS

## 2020-07-01 MED ORDER — ONDANSETRON HCL 4 MG/2ML IJ SOLN
INTRAMUSCULAR | Status: DC | PRN
  Administered 2020-07-01: 4 mg via INTRAVENOUS

## 2020-07-01 MED ORDER — PROMETHAZINE HCL 25 MG/ML IJ SOLN: 6.2500 mg | INTRAMUSCULAR | Status: DC | PRN

## 2020-07-01 MED ORDER — FENTANYL CITRATE (PF) 100 MCG/2ML IJ SOLN
INTRAMUSCULAR | Status: DC | PRN
  Administered 2020-07-01: 25 ug via INTRAVENOUS

## 2020-07-01 MED ORDER — OXYCODONE HCL 5 MG/5ML PO SOLN: 5.0000 mg | Freq: Once | ORAL | Status: DC | PRN

## 2020-07-01 MED ORDER — SODIUM CHLORIDE (PF) 0.9 % IJ SOLN
INTRAMUSCULAR | Status: AC
  Filled 2020-07-01: qty 20

## 2020-07-01 MED ORDER — 0.9 % SODIUM CHLORIDE (POUR BTL) OPTIME
TOPICAL | Status: DC | PRN
  Administered 2020-07-01: 2000 mL

## 2020-07-01 MED ORDER — ONABOTULINUMTOXINA 100 UNITS IJ SOLR
INTRAMUSCULAR | Status: AC
  Filled 2020-07-01: qty 200

## 2020-07-01 MED ORDER — LACTATED RINGERS IV SOLN: INTRAVENOUS | Status: DC

## 2020-07-01 MED ORDER — STERILE WATER FOR IRRIGATION IR SOLN
Status: DC | PRN
  Administered 2020-07-01: 12000 mL via INTRAVESICAL

## 2020-07-01 MED ORDER — CHLORHEXIDINE GLUCONATE 0.12 % MT SOLN
15.0000 mL | Freq: Once | OROMUCOSAL | Status: AC
  Administered 2020-07-01: 15 mL via OROMUCOSAL

## 2020-07-01 MED ORDER — PROPOFOL 500 MG/50ML IV EMUL
INTRAVENOUS | Status: AC
  Filled 2020-07-01: qty 50

## 2020-07-01 MED ORDER — FENTANYL CITRATE (PF) 100 MCG/2ML IJ SOLN: 25.0000 ug | INTRAMUSCULAR | Status: DC | PRN

## 2020-07-01 MED ORDER — OXYCODONE HCL 5 MG PO TABS: 5.0000 mg | ORAL_TABLET | Freq: Once | ORAL | Status: DC | PRN

## 2020-07-01 MED ORDER — GENTAMICIN SULFATE 40 MG/ML IJ SOLN
360.0000 mg | INTRAVENOUS | Status: AC
  Administered 2020-07-01: 360 mg via INTRAVENOUS
  Filled 2020-07-01: qty 9

## 2020-07-01 MED ORDER — ONABOTULINUMTOXINA 100 UNITS IJ SOLR
100.0000 [IU] | Freq: Once | INTRAMUSCULAR | Status: AC
  Administered 2020-07-01: 300 [IU] via INTRAMUSCULAR
  Filled 2020-07-01: qty 200

## 2020-07-01 MED ORDER — ONDANSETRON HCL 4 MG/2ML IJ SOLN
INTRAMUSCULAR | Status: AC
  Filled 2020-07-01: qty 2

## 2020-07-01 MED ORDER — LIDOCAINE 2% (20 MG/ML) 5 ML SYRINGE
INTRAMUSCULAR | Status: AC
  Filled 2020-07-01: qty 5

## 2020-07-01 MED ORDER — SODIUM CHLORIDE (PF) 0.9 % IJ SOLN
INTRAMUSCULAR | Status: AC
  Filled 2020-07-01: qty 10

## 2020-07-01 SURGICAL SUPPLY — 26 items
BAG DRN RND TRDRP ANRFLXCHMBR (UROLOGICAL SUPPLIES) ×2
BAG URINE DRAIN 2000ML AR STRL (UROLOGICAL SUPPLIES) ×2 IMPLANT
BAG URO CATCHER STRL LF (MISCELLANEOUS) ×4 IMPLANT
CATH FOLEY LATEX FREE 20FR (CATHETERS) ×4
CATH FOLEY LF 20FR (CATHETERS) IMPLANT
CATH ROBINSON RED A/P 14FR (CATHETERS) IMPLANT
CLOTH BEACON ORANGE TIMEOUT ST (SAFETY) ×4 IMPLANT
ELECT REM PT RETURN 15FT ADLT (MISCELLANEOUS) ×4 IMPLANT
GLOVE BIO SURGEON STRL SZ7.5 (GLOVE) ×4 IMPLANT
GOWN STRL REUS W/ TWL XL LVL3 (GOWN DISPOSABLE) ×6 IMPLANT
GOWN STRL REUS W/TWL XL LVL3 (GOWN DISPOSABLE) ×8
KIT TURNOVER KIT A (KITS) ×4 IMPLANT
LASER FIB FLEXIVA PULSE ID 910 (Laser) ×2 IMPLANT
MANIFOLD NEPTUNE II (INSTRUMENTS) ×4 IMPLANT
NDL ASPIRATION 22 (NEEDLE) IMPLANT
NDL SAFETY ECLIPSE 18X1.5 (NEEDLE) IMPLANT
NEEDLE ASPIRATION 22 (NEEDLE) ×4 IMPLANT
NEEDLE HYPO 18GX1.5 SHARP (NEEDLE) ×4
PACK CYSTO (CUSTOM PROCEDURE TRAY) ×4 IMPLANT
SYR 20ML LL LF (SYRINGE) IMPLANT
SYR CONTROL 10ML LL (SYRINGE) ×2 IMPLANT
SYR TOOMEY IRRIG 70ML (MISCELLANEOUS) ×4
SYRINGE TOOMEY IRRIG 70ML (MISCELLANEOUS) IMPLANT
TUBING CONNECTING 10 (TUBING) ×3 IMPLANT
TUBING CONNECTING 10' (TUBING) ×1
WATER STERILE IRR 3000ML UROMA (IV SOLUTION) ×4 IMPLANT

## 2020-07-01 NOTE — Op Note (Signed)
Operative Note  Preoperative diagnosis:  1.  History of spina bifida 2.  Neurogenic bladder with urge incontinence requiring an indwelling suprapubic catheter  Postoperative diagnosis: 1.  History of spina bifida 2.  Neurogenic bladder with urge incontinence requiring an indwelling suprapubic catheter 3.  2 cm bladder stone  Procedure(s): 1.  Cystoscopy with intravesical Botox injections (300 units) 2.  Cystolitholapaxy of 2 cm bladder stone 3.  Suprapubic catheter exchange  Surgeon: Ellison Hughs, MD  Assistants:  None  Anesthesia:  MAC  Complications:  None  EBL: 5 mL  Specimens: 1.  Bladder stone  Drains/Catheters: 1.  20 French silicone suprapubic catheter with 10 mL of sterile water in the balloon  Intraoperative findings:   1. 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 2 cm bladder stone 3. Poorly compliant bladder  Indication:  [name] is a 39 y.o. male with a history of spina bifida requiring an indwelling suprapubic tube and periodic bladder Botox injections to address ongoing urge incontinence/neurogenic bladder.  He has been consented for the above procedures, voices understanding and wishes to proceed.  Description of procedure:  After informed consent was obtained, the patient was brought to the operating room and general LMA anesthesia was administered. The patient was then placed in the dorsolithotomy position and prepped and draped in the usual sterile fashion. A timeout was performed. A 23 French rigid cystoscope was then inserted into the urethral meatus and advanced into the bladder under direct vision. A complete urethral and bladder survey revealed the findings listed above.  I then proceeded to inject a total of 300 units of Botox into the detrusor musculature and 1 cc aliquots spread throughout the bladder musculature in a grid like fashion.  There were 2 areas along the posterior bladder wall with a small  degree of bleeding after the Botox injections that were then fulgurated with Bugbee electrocautery, achieving excellent hemostasis.  I then used a 1000 W holmium laser to fracture his 2 cm bladder stone into numerous smaller pieces that were then siphoned out of the bladder through the cystoscope sheath.  His 28 French latex suprapubic tube was then exchanged for a 20 [place] latex free suprapubic tube with the balloon inflated with 10 mL of sterile water.  The suprapubic catheter was then extensively hand irrigated and was free of clot/obstruction.  The suprapubic tube was then placed to gravity drainage.  He tolerated the procedure well and was transferred to the postanesthesia in stable condition.  Plan: Follow-up in 1 month to exchange his suprapubic tube.

## 2020-07-01 NOTE — H&P (Signed)
Urology Preoperative H&P   Chief Complaint: Neurogenic bladder  History of Present Illness: [name] is a 39 y.o. male with with a history of spina bifida and poorly compliant, neurogenic bladder managed with an SP tube and intermittent botox injections.  He is here today for a repeat botox injection.  He denies interval UTIs, dysuria or hematuria.    Past Medical History:  Diagnosis Date  . Arnold-Chiari malformation ([place])   . 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 GERD (gastroesophageal reflux disease)   . Heart palpitations    SECONDARY TO VP SHUNT DRAIN  . History of seizure    per pt mother, no seizures since age 60  . Hypertension   . Ileostomy in place [facility]) 2014  . Migraines followed by neurologist @[facility]    taking verapamil  . Neurogenic bladder    INCONTINENT -  . Neurogenic bowel   . Pre-diabetes   . S/P VP shunt   . 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 Urinary incontinence with continuous leakage   . Wears glasses   . Wheelchair bound    CAN TRASFER SELF WITHOUT BOARD    Past Surgical History:  Procedure Laterality Date  . BACK SURGERY  infant   "get skin to stretch over his back; spinal bifida"  . BACK SURGERY  age 29   scoliosis and  removed hump on his back (kyphosis) and put rods in to stabalize back"  . BOTOX INJECTION N/A 11/14/2018   Procedure: BOTOX INJECTION CYSTOSCOPY;  Surgeon: Lucas Mallow, MD;  Location: [facility];  Service: Urology;  Laterality: N/A;  . BOTOX INJECTION N/A 05/22/2019   Procedure: BOTOX INJECTION;  Surgeon: Lucas Mallow, MD;  Location: [facility];  Service: Urology;  Laterality: N/A;  . BOTOX INJECTION N/A 11/13/2019   Procedure: CYSTOSCOPY BOTOX INJECTION;  Surgeon: Lucas Mallow, MD;  Location: [facility];  Service: Urology;  Laterality: N/A;  . CHOLECYSTECTOMY N/A 07/06/2013   Procedure: LAPAROSCOPIC  CHOLECYSTECTOMY WITH INTRAOPERATIVE CHOLANGIOGRAM;  Surgeon: Madilyn Hook, DO;  Location: [facility];  Service: General;  Laterality: N/A;  . COLON RESECTION N/A 07/08/2013   Procedure: EXPLORATORY LAPAROSCOPY, DIAGNOSTIC LAPAROSCOPY, PARTIAL COLECTOMY, ABDOMINAL WASHOUT, ABDOMINAL WOUND VAC;  Surgeon: Madilyn Hook, DO;  Location: [facility];  Service: General;  Laterality: N/A;  . HIP SURGERY Bilateral AS CHILD   TENDON RELEASE  . INCISION AND DRAINAGE OF WOUND N/A 11/04/2013   Procedure: IRRIGATION AND DEBRIDEMENT ABDOMINAL WOUND WITH PLACEMENT OF ACELL/VAC;  Surgeon: Theodoro Kos, DO;  Location: [place];  Service: Plastics;  Laterality: N/A;  . INSERTION OF SUPRAPUBIC CATHETER N/A 12/23/2019   Procedure: FLEXIBLE CYSTOSCOPE WITH FOLEY PLACEMENT OVER WIRE.;  Surgeon: Robley Fries, MD;  Location: [facility];  Service: Urology;  Laterality: N/A;  . IR CATHETER TUBE CHANGE  02/15/2019  . IR CATHETER TUBE CHANGE  03/01/2019  . IR CATHETER TUBE CHANGE  05/06/2019  . IR CATHETER TUBE CHANGE  03/06/2020  . IR CATHETER TUBE CHANGE  04/16/2020  . IR CATHETER TUBE CHANGE  04/29/2020  . 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 RIGHT COLECTOMY/  APPENDECTOMY/  ILEOSTOMY  NOV 2014  [facility]   surgical complication with cholecystectomy  . UMBILICAL HERNIA REPAIR  child  . VENTRICULOPERITONEAL SHUNT  11-12-2018 per pt and mother---LAST REVISION ~  06/2015   DUE TO ABD WOUND--- SHUNT DRAINS TO HEART (12/23/2015)    Allergies:  Allergies  Allergen Reactions  . Latex Hives  . Succinylcholine Rash    Other reaction(s): Other (See Comments) Pt is half [place] - prolonged paralysis due to compromised metabolism. Succinylcholine given on 07/07/15 at outside hospital and pt experienced paralysis > 3 hours.  Due to being native [place], his mother states that he takes longer to "come out of it"  . Ciprofloxacin Other (See Comments)    IV only-- caused burning in arm   . Vancomycin  Itching and Swelling  . Adhesive [Tape] Hives and Rash    Family History  Problem Relation Age of Onset  . Diabetes Other   . Hypertension Other   . Cancer Other   . Stroke Other     Social History:  reports that he has never smoked. He has never used smokeless tobacco. He reports that he does not drink alcohol and does not use drugs.  ROS: A complete review of systems was performed.  All systems are negative except for pertinent findings as noted.  Physical Exam:  Vital signs in last 24 hours:   Constitutional:  Alert and oriented, No acute distress Cardiovascular: Regular rate and rhythm, No JVD Respiratory: Normal respiratory effort, Lungs clear bilaterally GI: Abdomen is soft, nontender, nondistended, no abdominal masses GU: No CVA tenderness Lymphatic: No lymphadenopathy Neurologic: Grossly intact, no focal deficits Psychiatric: Normal mood and affect  Laboratory Data:  No results for input(s): WBC, HGB, HCT, PLT in the last 72 hours.  No results for input(s): NA, K, CL, GLUCOSE, BUN, CALCIUM, CREATININE in the last 72 hours.  Invalid input(s): CO3   No results found for this or any previous visit (from the past 24 hour(s)). No results found for this or any previous visit (from the past 240 hour(s)).  Renal Function: Recent Labs    06/24/20 1457  CREATININE 0.63   CrCl cannot be calculated (Unknown ideal weight.).  Radiologic Imaging: No results found.  I independently reviewed the above imaging studies.  Assessment and Plan [name] is a 39 y.o. male with spina bifida and a neurogenic bladder.   -The risks, benefits and alternatives of cystoscopy with bladder botox injections was discussed with the patient.  Risks included, but are not limited to bleeding, urinary tract infection, urethral stricture formation, MI, CVA, DVT, PE and the inherent risks with general anesthesia.  He voices understanding and wishes to proceed.   Ellison Hughs,  MD 07/01/2020, 7:53 AM  [facility] Pager: [phone]

## 2020-07-01 NOTE — Anesthesia Postprocedure Evaluation (Signed)
Anesthesia Post Note  Patient: Leodis Sias  Procedure(s) Performed: CYSTOSCOPY, BOTOX INJECTION, BLADDER FULGERATION, SUPRAPUBIC TUBE EXCHANGE (N/A ) HOLMIUM LASER OF BLADDER STONE (N/A Bladder)     Patient location during evaluation: PACU Anesthesia Type: MAC Level of consciousness: awake and alert Pain management: pain level controlled Vital Signs Assessment: post-procedure vital signs reviewed and stable Respiratory status: spontaneous breathing, nonlabored ventilation, respiratory function stable and patient connected to nasal cannula oxygen Cardiovascular status: stable and blood pressure returned to baseline Postop Assessment: no apparent nausea or vomiting Anesthetic complications: no   No complications documented.  Last Vitals:  Vitals:   07/01/20 1210 07/01/20 1245  BP: 134/74 132/72  Pulse: 86 84  Resp: 16 15  Temp: 36.8 C 36.7 C  SpO2: 99% 99%    Last Pain:  Vitals:   07/01/20 1245  TempSrc:   PainSc: 0-No pain                 Harvest Stanco P [place]

## 2020-07-01 NOTE — Transfer of Care (Signed)
Immediate Anesthesia Transfer of Care Note  Patient: Leodis Sias  Procedure(s) Performed: CYSTOSCOPY, BOTOX INJECTION, BLADDER FULGERATION, SUPRAPUBIC TUBE EXCHANGE (N/A ) HOLMIUM LASER OF BLADDER STONE (N/A Bladder)  Patient Location: PACU  Anesthesia Type:MAC  Level of Consciousness: awake, alert , oriented and patient cooperative  Airway & Oxygen Therapy: Patient Spontanous Breathing and Patient connected to face mask oxygen  Post-op Assessment: Report given to RN and Post -op Vital signs reviewed and stable  Post vital signs: Reviewed and stable  Last Vitals:  Vitals Value Taken Time  BP 118/88 07/01/20 1136  Temp    Pulse 86 07/01/20 1138  Resp 15 07/01/20 1138  SpO2 100 % 07/01/20 1138  Vitals shown include unvalidated device data.  Last Pain:  Vitals:   07/01/20 0752  TempSrc: Oral  PainSc: 0-No pain         Complications: No complications documented.

## 2020-07-01 NOTE — Anesthesia Procedure Notes (Signed)
Procedure Name: MAC Date/Time: 07/01/2020 10:10 AM Performed by: Victoriano Lain, CRNA Pre-anesthesia Checklist: Patient identified, Emergency Drugs available, Suction available, Patient being monitored and Timeout performed Patient Re-evaluated:Patient Re-evaluated prior to induction Oxygen Delivery Method: Simple face mask Dental Injury: Teeth and Oropharynx as per pre-operative assessment

## 2020-07-02 ENCOUNTER — Encounter (HOSPITAL_COMMUNITY): Payer: Self-pay | Admitting: Urology

## 2020-07-09 ENCOUNTER — Other Ambulatory Visit (HOSPITAL_COMMUNITY): Payer: Medicare Other

## 2020-10-19 ENCOUNTER — Emergency Department (HOSPITAL_COMMUNITY): Payer: Medicare Other

## 2020-10-19 ENCOUNTER — Emergency Department (HOSPITAL_COMMUNITY)
Admission: EM | Admit: 2020-10-19 | Discharge: 2020-10-19 | Disposition: A | Discharge status: Home / Self Care | Payer: Medicare Other | Attending: Emergency Medicine | Admitting: Emergency Medicine

## 2020-10-19 ENCOUNTER — Encounter (HOSPITAL_COMMUNITY): Payer: Self-pay | Admitting: Emergency Medicine

## 2020-10-19 ENCOUNTER — Other Ambulatory Visit: Payer: Self-pay

## 2020-10-19 DIAGNOSIS — I129 Hypertensive chronic kidney disease with stage 1 through stage 4 chronic kidney disease, or unspecified chronic kidney disease: Secondary | ICD-10-CM | POA: Diagnosis not present

## 2020-10-19 DIAGNOSIS — K529 Noninfective gastroenteritis and colitis, unspecified: Secondary | ICD-10-CM

## 2020-10-19 DIAGNOSIS — E875 Hyperkalemia: Secondary | ICD-10-CM | POA: Insufficient documentation

## 2020-10-19 DIAGNOSIS — R519 Headache, unspecified: Secondary | ICD-10-CM | POA: Diagnosis not present

## 2020-10-19 DIAGNOSIS — R109 Unspecified abdominal pain: Secondary | ICD-10-CM | POA: Diagnosis present

## 2020-10-19 DIAGNOSIS — K219 Gastro-esophageal reflux disease without esophagitis: Secondary | ICD-10-CM | POA: Insufficient documentation

## 2020-10-19 DIAGNOSIS — N390 Urinary tract infection, site not specified: Secondary | ICD-10-CM | POA: Diagnosis not present

## 2020-10-19 DIAGNOSIS — N189 Chronic kidney disease, unspecified: Secondary | ICD-10-CM | POA: Insufficient documentation

## 2020-10-19 DIAGNOSIS — A0471 Enterocolitis due to Clostridium difficile, recurrent: Secondary | ICD-10-CM | POA: Diagnosis not present

## 2020-10-19 DIAGNOSIS — Z91040 Latex allergy status: Secondary | ICD-10-CM | POA: Insufficient documentation

## 2020-10-19 DIAGNOSIS — Z79899 Other long term (current) drug therapy: Secondary | ICD-10-CM | POA: Insufficient documentation

## 2020-10-19 DIAGNOSIS — Z20822 Contact with and (suspected) exposure to covid-19: Secondary | ICD-10-CM | POA: Insufficient documentation

## 2020-10-19 LAB — CBC WITH DIFFERENTIAL/PLATELET
Abs Immature Granulocytes: 0.07 10*3/uL (ref 0.00–0.07)
Basophils Absolute: 0 10*3/uL (ref 0.0–0.1)
Basophils Relative: 0 %
Eosinophils Absolute: 0.1 10*3/uL (ref 0.0–0.5)
Eosinophils Relative: 1 %
HCT: 50.1 % (ref 39.0–52.0)
Hemoglobin: 16.1 g/dL (ref 13.0–17.0)
Immature Granulocytes: 1 %
Lymphocytes Relative: 12 %
Lymphs Abs: 1.8 10*3/uL (ref 0.7–4.0)
MCH: 25.3 pg — ABNORMAL LOW (ref 26.0–34.0)
MCHC: 32.1 g/dL (ref 30.0–36.0)
MCV: 78.8 fL — ABNORMAL LOW (ref 80.0–100.0)
Monocytes Absolute: 1.1 10*3/uL — ABNORMAL HIGH (ref 0.1–1.0)
Monocytes Relative: 7 %
Neutro Abs: 12.3 10*3/uL — ABNORMAL HIGH (ref 1.7–7.7)
Neutrophils Relative %: 79 %
Platelets: 272 10*3/uL (ref 150–400)
RBC: 6.36 MIL/uL — ABNORMAL HIGH (ref 4.22–5.81)
RDW: 14 % (ref 11.5–15.5)
WBC: 15.4 10*3/uL — ABNORMAL HIGH (ref 4.0–10.5)
nRBC: 0 % (ref 0.0–0.2)

## 2020-10-19 LAB — URINALYSIS, ROUTINE W REFLEX MICROSCOPIC
Bilirubin Urine: NEGATIVE
Glucose, UA: NEGATIVE mg/dL
Ketones, ur: 20 mg/dL — AB
Nitrite: NEGATIVE
Protein, ur: NEGATIVE mg/dL
Specific Gravity, Urine: 1.005 (ref 1.005–1.030)
pH: 7 (ref 5.0–8.0)

## 2020-10-19 LAB — COMPREHENSIVE METABOLIC PANEL
ALT: 94 U/L — ABNORMAL HIGH (ref 0–44)
AST: 33 U/L (ref 15–41)
Albumin: 4.3 g/dL (ref 3.5–5.0)
Alkaline Phosphatase: 89 U/L (ref 38–126)
Anion gap: 17 — ABNORMAL HIGH (ref 5–15)
BUN: 30 mg/dL — ABNORMAL HIGH (ref 6–20)
CO2: 24 mmol/L (ref 22–32)
Calcium: 9.1 mg/dL (ref 8.9–10.3)
Chloride: 87 mmol/L — ABNORMAL LOW (ref 98–111)
Creatinine, Ser: 0.81 mg/dL (ref 0.61–1.24)
GFR, Estimated: >60 mL/min (ref >60)
Glucose, Bld: 104 mg/dL — ABNORMAL HIGH (ref 70–99)
Potassium: 5.8 mmol/L — ABNORMAL HIGH (ref 3.5–5.1)
Sodium: 128 mmol/L — ABNORMAL LOW (ref 135–145)
Total Bilirubin: 1.3 mg/dL — ABNORMAL HIGH (ref 0.3–1.2)
Total Protein: 8.9 g/dL — ABNORMAL HIGH (ref 6.5–8.1)

## 2020-10-19 LAB — LIPASE, BLOOD: Lipase: 19 U/L (ref 11–51)

## 2020-10-19 MED ORDER — CEPHALEXIN 500 MG PO CAPS: 500.0000 mg | ORAL_CAPSULE | Freq: Two times a day (BID) | ORAL | 0 refills | Status: AC

## 2020-10-19 MED ORDER — ONDANSETRON 4 MG PO TBDP: 4.0000 mg | ORAL_TABLET | Freq: Three times a day (TID) | ORAL | 0 refills | Status: DC | PRN

## 2020-10-19 MED ORDER — SODIUM CHLORIDE 0.9 % IV BOLUS
1000.0000 mL | Freq: Once | INTRAVENOUS | Status: AC
  Administered 2020-10-19: 1000 mL via INTRAVENOUS

## 2020-10-19 MED ORDER — IOHEXOL 300 MG/ML  SOLN
100.0000 mL | Freq: Once | INTRAMUSCULAR | Status: AC | PRN
  Administered 2020-10-19: 100 mL via INTRAVENOUS

## 2020-10-19 MED ORDER — CEPHALEXIN 500 MG PO CAPS
500.0000 mg | ORAL_CAPSULE | Freq: Once | ORAL | Status: AC
  Administered 2020-10-19: 500 mg via ORAL
  Filled 2020-10-19: qty 1

## 2020-10-19 NOTE — Discharge Instructions (Addendum)
Stop taking the potassium.  Your potassium level was elevated today.  Call your doctor to find out when to restart.  If you develop fever, vomiting, new or worsening pain, or any other new/concerning symptoms then return to the ER or call 911.

## 2020-10-19 NOTE — ED Triage Notes (Signed)
Patient BIBA from home c/o dark-colored urine and a possible colostomy obstruction. Patient's mother is his caretaker and she reports he had a colostomy obstruction recently which she cleared with rods and a vinegar treatment. She reports she noticed the dark-colored urine afterwards. Patient has a suprapubic catheter in place and his mother is concerned that the colostomy bag may be causing a leakage into his catheter. The patient's only complaint is a headache. Hx of HTN, spina bifida, and frequent UTIs.  BP 114/82 P 68 98% RA CBG 121 T 98.1

## 2020-10-19 NOTE — ED Provider Notes (Signed)
[facility] DEPT Provider Note   CSN: [id number] Arrival date & time: 10/19/20  1628     History No chief complaint on file.   [name] is a 40 y.o. male.  HPI 40 year old male presents with multiple issues.  Primary history is taken from the mom but also a little bit from the patient.  Patient has a complex medical history as below.  Mom has noticed decreased urine output since yesterday.  He had decreased p.o. intake and some gagging since yesterday.  Ileostomy has also had minimal stool output despite mom trying to induce output.  This morning he started complaining of a headache.  Has a history of a VP shunt.  His suprapubic catheter was most recently changed about a week ago and she has been irrigating it.  However his urine has turned a blue color.  No fevers.  No change in mental status. Has complained of abdominal bloating, but his abdomen doesn't look distended.  Past Medical History:  Diagnosis Date  . Arnold-Chiari malformation ([place])   . 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 GERD (gastroesophageal reflux disease)   . Heart palpitations    SECONDARY TO VP SHUNT DRAIN  . History of seizure    per pt mother, no seizures since age 65  . Hypertension   . Ileostomy in place [facility]) 2014  . Migraines followed by neurologist @[facility]    taking verapamil  . Neurogenic bladder    INCONTINENT -  . Neurogenic bowel   . Pre-diabetes   . S/P VP shunt   . 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 Urinary incontinence with continuous leakage   . Wears glasses   . Wheelchair bound    CAN TRASFER SELF WITHOUT BOARD    Patient Active Problem List   Diagnosis Date Noted  . Essential hypertension 12/23/2015  . Hypotension 12/23/2015  . Decubitus ulcer of buttock, stage 3 ([place]) 11/20/2015  . Complicated UTI (urinary tract infection)   .  Sepsis secondary to UTI ([place]) 11/19/2015  . Sepsis ([place]) 07/22/2015  . AKI (acute kidney injury) ([place]) 07/22/2015  . Hyperkalemia 07/22/2015  . Hyponatremia 07/22/2015  . Thrombocytosis 07/22/2015  . Leukocytosis 07/22/2015  . Borderline diabetes 07/22/2015  . Arnold-Chiari malformation ([place]) 07/22/2015  . Urinary tract infectious disease   . Headache 11/06/2014  . H/O ventricular shunt   . 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 Severe sepsis ([place]) 10/26/2014  . Renal failure (ARF), acute on chronic (HCC) 10/26/2014  . Severe sepsis with septic shock ([place]) 10/26/2014  . UTI (lower urinary tract infection)   . Anemia in chronic renal disease 09/30/2013  . Esophageal reflux 09/17/2013  . Protein-calorie malnutrition, severe ([place]) 09/17/2013  . Essential hypertension, benign 09/17/2013  . Anemia of other chronic disease 09/17/2013  . Septic shock ([place]) 07/09/2013  . Spina bifida ([place]) 07/07/2013  . Neurogenic bladder 07/07/2013  . Elevated LFTs 07/07/2013    Past Surgical History:  Procedure Laterality Date  . BACK SURGERY  infant   "get skin to stretch over his back; spinal bifida"  . BACK SURGERY  age 53   scoliosis and  removed hump on his back (kyphosis) and put rods in to stabalize back"  . BOTOX INJECTION N/A 11/14/2018   Procedure: BOTOX INJECTION CYSTOSCOPY;  Surgeon: Lucas Mallow, MD;  Location:  [facility];  Service: Urology;  Laterality: N/A;  . BOTOX INJECTION N/A 05/22/2019   Procedure: BOTOX INJECTION;  Surgeon: Lucas Mallow, MD;  Location: [facility];  Service: Urology;  Laterality: N/A;  . BOTOX INJECTION N/A 11/13/2019   Procedure: CYSTOSCOPY BOTOX INJECTION;  Surgeon: Lucas Mallow, MD;  Location: [facility];  Service: Urology;  Laterality: N/A;  . BOTOX INJECTION N/A 07/01/2020   Procedure: CYSTOSCOPY, BOTOX INJECTION, BLADDER FULGERATION, SUPRAPUBIC TUBE EXCHANGE;  Surgeon: Ceasar Mons, MD;  Location: [facility];  Service: Urology;  Laterality: N/A;   . CHOLECYSTECTOMY N/A 07/06/2013   Procedure: LAPAROSCOPIC CHOLECYSTECTOMY WITH INTRAOPERATIVE CHOLANGIOGRAM;  Surgeon: Madilyn Hook, DO;  Location: [facility];  Service: General;  Laterality: N/A;  . COLON RESECTION N/A 07/08/2013   Procedure: EXPLORATORY LAPAROSCOPY, DIAGNOSTIC LAPAROSCOPY, PARTIAL COLECTOMY, ABDOMINAL WASHOUT, ABDOMINAL WOUND VAC;  Surgeon: Madilyn Hook, DO;  Location: [facility];  Service: General;  Laterality: N/A;  . HIP SURGERY Bilateral AS CHILD   TENDON RELEASE  . HOLMIUM LASER APPLICATION N/A [id number]   Procedure: HOLMIUM LASER OF BLADDER STONE;  Surgeon: Ceasar Mons, MD;  Location: [facility];  Service: Urology;  Laterality: N/A;  . INCISION AND DRAINAGE OF WOUND N/A 11/04/2013   Procedure: IRRIGATION AND DEBRIDEMENT ABDOMINAL WOUND WITH PLACEMENT OF ACELL/VAC;  Surgeon: Theodoro Kos, DO;  Location: [facility];  Service: Plastics;  Laterality: N/A;  . INSERTION OF SUPRAPUBIC CATHETER N/A 12/23/2019   Procedure: FLEXIBLE CYSTOSCOPE WITH FOLEY PLACEMENT OVER WIRE.;  Surgeon: Robley Fries, MD;  Location: [facility];  Service: Urology;  Laterality: N/A;  . IR CATHETER TUBE CHANGE  02/15/2019  . IR CATHETER TUBE CHANGE  03/01/2019  . IR CATHETER TUBE CHANGE  05/06/2019  . IR CATHETER TUBE CHANGE  03/06/2020  . IR CATHETER TUBE CHANGE  04/16/2020  . IR CATHETER TUBE CHANGE  04/29/2020  . 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 RIGHT COLECTOMY/  APPENDECTOMY/  ILEOSTOMY  NOV 2014  [facility]   surgical complication with cholecystectomy  . UMBILICAL HERNIA REPAIR  child  . VENTRICULOPERITONEAL SHUNT  11-12-2018 per pt and mother---LAST REVISION ~ 06/2015   DUE TO ABD WOUND--- SHUNT DRAINS TO HEART (12/23/2015)       Family History  Problem Relation Age of Onset  . Diabetes Other   . Hypertension Other   . Cancer Other   . Stroke Other     Social History   Tobacco Use  . Smoking status: Never Smoker  . Smokeless tobacco: Never Used   Vaping Use  . Vaping Use: Never used  Substance Use Topics  . Alcohol use: No  . Drug use: Never    Home Medications Prior to Admission medications   Medication Sig Start Date End Date Taking? Authorizing Provider  cephALEXin (KEFLEX) 500 MG capsule Take 1 capsule (500 mg total) by mouth 2 (two) times daily for 10 days. 10/19/20 10/29/20 Yes Sherwood Gambler, MD  ondansetron (ZOFRAN ODT) 4 MG disintegrating tablet Take 1 tablet (4 mg total) by mouth every 8 (eight) hours as needed for nausea or vomiting. 10/19/20  Yes Sherwood Gambler, MD  acetaminophen (TYLENOL) 650 MG CR tablet Take 1,300 mg by mouth every 8 (eight) hours as needed for pain.    [provider]  Ascorbic Acid (VITAMIN C) 1000 MG tablet Take 1,000 mg by mouth daily.    [provider]  calcium-vitamin D (OSCAL 500/200 D-3) 500-200 MG-UNIT tablet Take 1 tablet  by mouth every evening.     [provider]  CRANBERRY PO Take 1 tablet by mouth in the morning and at bedtime.    [provider]  Cranberry-Vitamin C-Vitamin E (CRANBERRY PLUS VITAMIN C) 4200-20-3 MG-MG-UNIT CAPS Take 1 capsule by mouth daily. Patient not taking: Reported on 06/16/2020    [provider]  docusate sodium (COLACE) 100 MG capsule Take 200 mg by mouth daily.     [provider]  metoprolol tartrate (LOPRESSOR) 50 MG tablet Take 50 mg by mouth 2 (two) times daily.    [provider]  Multiple Vitamin (MULTIVITAMIN WITH MINERALS) TABS tablet Take 1 tablet by mouth daily.    [provider]  ondansetron (ZOFRAN) 4 MG tablet Take 4 mg by mouth every 8 (eight) hours as needed for nausea or vomiting. Patient not taking: Reported on 06/16/2020    [provider]  oxybutynin (DITROPAN) 5 MG tablet Take 5 mg by mouth in the morning, at noon, and at bedtime.  10/03/19   [provider]  rosuvastatin (CRESTOR) 20 MG tablet Take 20 mg by mouth daily.  09/10/18   [provider]   verapamil (CALAN-SR) 120 MG CR tablet Take 120 mg by mouth at bedtime. 09/26/19   [provider]    Allergies    Latex, Succinylcholine, Ciprofloxacin, Vancomycin, and Adhesive [tape]  Review of Systems   Review of Systems  Constitutional: Negative for fever.  Respiratory: Negative for cough and shortness of breath.   Gastrointestinal: Positive for constipation and vomiting.  Genitourinary: Positive for decreased urine volume.  Neurological: Positive for headaches.  All other systems reviewed and are negative.   Physical Exam Updated Vital Signs BP 115/63   Pulse 75   Temp 98.2 F (36.8 C) (Oral)   Resp 18   SpO2 98%   Physical Exam Vitals and nursing note reviewed.  Constitutional:      Appearance: He is well-developed and well-nourished. He is obese.  HENT:     Head: Normocephalic and atraumatic.     Right Ear: External ear normal.     Left Ear: External ear normal.     Nose: Nose normal.  Eyes:     General:        Right eye: No discharge.        Left eye: No discharge.     Pupils: Pupils are equal, round, and reactive to light.  Cardiovascular:     Rate and Rhythm: Normal rate and regular rhythm.     Heart sounds: Normal heart sounds.  Pulmonary:     Effort: Pulmonary effort is normal.     Breath sounds: Normal breath sounds.  Abdominal:     General: There is no distension.     Palpations: Abdomen is soft.     Tenderness: There is no abdominal tenderness.     Comments: Multiple abdominal scars. Ileostomy and suprapubic sites appear ok  Musculoskeletal:        General: No edema.     Cervical back: Neck supple.  Skin:    General: Skin is warm and dry.  Neurological:     Mental Status: He is alert.     Comments: Awake, alert. Equal grip strength bilaterally. Chronically unable to use legs  Psychiatric:        Mood and Affect: Mood is not anxious.     ED Results / Procedures / Treatments   Labs (all labs ordered are listed, but only abnormal  results  are displayed) Labs Reviewed  URINALYSIS, ROUTINE W REFLEX MICROSCOPIC - Abnormal; Notable for the following components:      Result Value   APPearance HAZY (*)    Hgb urine dipstick MODERATE (*)    Ketones, ur 20 (*)    Leukocytes,Ua LARGE (*)    Bacteria, UA RARE (*)    All other components within normal limits  COMPREHENSIVE METABOLIC PANEL - Abnormal; Notable for the following components:   Sodium 128 (*)    Potassium 5.8 (*)    Chloride 87 (*)    Glucose, Bld 104 (*)    BUN 30 (*)    Total Protein 8.9 (*)    ALT 94 (*)    Total Bilirubin 1.3 (*)    Anion gap 17 (*)    All other components within normal limits  CBC WITH DIFFERENTIAL/PLATELET - Abnormal; Notable for the following components:   WBC 15.4 (*)    RBC 6.36 (*)    MCV 78.8 (*)    MCH 25.3 (*)    Neutro Abs 12.3 (*)    Monocytes Absolute 1.1 (*)    All other components within normal limits  URINE CULTURE  SARS CORONAVIRUS 2 (TAT 6-24 HRS)  LIPASE, BLOOD    EKG EKG Interpretation  Date/Time:  Monday October 19 2020 19:01:30 EST Ventricular Rate:  76 PR Interval:    QRS Duration: 82 QT Interval:  376 QTC Calculation: 423 R Axis:   84 Text Interpretation: Sinus rhythm ST elev, probable normal early repol pattern Confirmed by Sherwood Gambler [phone]) on 10/19/2020 7:36:58 PM   Radiology CT Head Wo Contrast  Result Date: 10/19/2020 CLINICAL DATA:  Concern for possible shunt malfunction. EXAM: CT HEAD WITHOUT CONTRAST TECHNIQUE: Contiguous axial images were obtained from the base of the skull through the vertex without intravenous contrast. COMPARISON:  Head CT December 23, 2015 FINDINGS: Brain: Similar positioning of the right parietal approach ventriculoperitoneal shunt which extends across midline into the left lateral frontal horn. Unchanged appearance of the ventricular system without evidence of hydrocephalus. No acute large vessel territory infarction or intracranial hemorrhage. 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 Vascular: No hyperdense vessel or unexpected calcification. Skull: Right posterior approach VP shunt burr hole. Sinuses/Orbits: Paranasal sinuses are predominantly clear. Other: None IMPRESSION: Stable positioning of the right parietal approach ventriculoperitoneal shunt without hydrocephalus. Electronically Signed   By: Dahlia Bailiff MD   On: 10/19/2020 20:55   CT ABDOMEN PELVIS W CONTRAST  Result Date: 10/19/2020 CLINICAL DATA:  Dark-colored urine, possible colostomy obstruction EXAM: CT ABDOMEN AND PELVIS WITH CONTRAST TECHNIQUE: Multidetector CT imaging of the abdomen and pelvis was performed using the standard protocol following bolus administration of intravenous contrast. CONTRAST:  165mL OMNIPAQUE IOHEXOL 300 MG/ML  SOLN COMPARISON:  Abdominal radiograph 12/23/2015, CT abdomen pelvis 07/05/2013 FINDINGS: Lower chest: Lung bases are clear. Normal heart size. No pericardial effusion. Hepatobiliary: No worrisome focal liver lesions. Smooth liver surface contour. Normal hepatic attenuation. Patient appears to be post cholecystectomy. No biliary ductal dilatation or visible intraductal gallstones. Pancreas: Partial fatty replacement of the pancreas. No pancreatic ductal dilatation or surrounding inflammatory changes. Spleen: Normal in size. No concerning splenic lesions. Adrenals/Urinary Tract: Normal adrenals. Kidneys are normally located with symmetric enhancement. No suspicious renal lesion, urolithiasis or hydronephrosis. Some mild perivesicular and periureteral hazy stranding is present. Bladder decompressed by a suprapubic catheter. Trabeculated bladder protrudes over the symphysis pubis with the significant anterior abdominal wall laxity. Stomach/Bowel: Distal esophagus, stomach  and duodenum are unremarkable. Much of the distal small bowel and colonic remnant appears circumferentially thickened with some mucosal hyperemia. Loops protrude into the anterior  abdominal wall laxity without evidence of mechanical obstruction. Some mild waisting noted at the colostomy site in the right lower quadrant but without evidence of frank mechanical obstruction at this time. Lack of formed stool throughout the alimentary tract. Much of the disconnected colonic remnant is decompressed without gross acute abnormality. Vascular/Lymphatic: No significant vascular findings are present. No enlarged abdominal or pelvic lymph nodes. Reproductive: The prostate and seminal vesicles are unremarkable. No acute abnormality of the external genitalia. Other: Significant anterior abdominal wall laxity with large rectus diastasis which is similar to prior. Multiple loops nonobstructed bowel are contained within ventral laxity. Additional pelvic floor laxity. A ventricular shunt catheter appears to terminate in the subcutaneous tissues of the right anterior abdominal wall (11/26/2018). Musculoskeletal: Redemonstrated features of spinal dysraphism. Thoracolumbar fusion hardware is again seen with discontinuity left fusion rod which is unchanged from comparison studies. No acute hardware complication or failure is seen. Bony fusion across the SI joints. Heterotopic calcification of the greater trochanter similar to prior. Extensive abdominal wall and pelvic muscular atrophy is similar to priors. IMPRESSION: 1. Much of the distal small bowel and contiguous colonic remnant appears circumferentially thickened with some mucosal hyperemia. Lack of formed stool throughout the alimentary tract. Findings are suggestive of infectious or inflammatory enterocolitis with rapid transit state. 2. Trabeculated bladder protrudes over the symphysis pubis with the significant anterior abdominal wall laxity as demonstrated previously. Some circumferential thickening with mild perivesicular and periureteral hazy stranding is present. Recommend correlation with urinalysis to exclude cystitis. 3. Significant anterior  abdominal wall laxity with progressive large rectus diastasis. Multiple loops nonobstructed bowel are contained within ventral laxity. 4. Thoracolumbar fusion hardware with discontinuity left fusion rod, unchanged from comparison studies. No acute hardware complication or failure is seen. Extensive abdominal wall and pelvic muscular atrophy is similar to priors. Findings compatible with history of spina bifida. 5. Ventricular shunt catheter terminus is within the subcutaneous tissues of the right anterior abdominal wall. Electronically Signed   By: Lovena Le M.D.   On: 10/19/2020 20:45   DG Chest Portable 1 View  Result Date: 10/19/2020 CLINICAL DATA:  Weakness, evaluate VP shunt tubing EXAM: PORTABLE CHEST 1 VIEW COMPARISON:  Radiograph 12/23/2015, CT 07/08/2013 FINDINGS: Two separate catheter tracks are seen extending across the base of the right neck and right chest wall. A discontiguous catheter is seen more laterally with some redundancy in the cervical soft tissues. There is a second more medial contiguous VP shunt catheter without visible kinking or complication as is courses to the abdomen. Thoracic hardware is similar to prior including a stable appearance of the discontiguous left fusion rod. No acute complication is evident. Low lung volumes. Prominence of the cardiomediastinal contours likely reflective of a combination mediastinal fat and chest wall deformity. Remaining cardiomediastinal contours are unremarkable. Lungs are clear. No visible pneumothorax or effusion. IMPRESSION: 1. Thoracic portions of an intact shunt catheter are seen medially. More lateral discontiguous catheter tract is noted as well. 2. Thoracic hardware including a discontiguous left fusion rod is unchanged priors. 3. No acute cardiopulmonary abnormality. Electronically Signed   By: Lovena Le M.D.   On: 10/19/2020 18:14    Procedures Procedures   Medications Ordered in ED Medications  sodium chloride 0.9 % bolus  1,000 mL (0 mLs Intravenous Stopped 10/19/20 2031)  iohexol (OMNIPAQUE) 300 MG/ML solution 100 mL (  100 mLs Intravenous Contrast Given 10/19/20 2011)  cephALEXin (KEFLEX) capsule 500 mg (500 mg Oral Given 10/19/20 2121)  sodium chloride 0.9 % bolus 1,000 mL (1,000 mLs Intravenous New Bag/Given 10/19/20 2121)    ED Course  I have reviewed the triage vital signs and the nursing notes.  Pertinent labs & imaging results that were available during my care of the patient were reviewed by me and considered in my medical decision making (see chart for details).    MDM Rules/Calculators/A&P                          Patient is afebrile.  Labs show a leukocytosis in addition to mild hyponatremia, BUN elevation and hyperkalemia.  There are no ECG changes.  His urine is consistent with UTI, hard to interpret based on lack of symptoms and chronic Foley but with the CT findings I think it would be best to treat.  His lack of stool output is probably more from what is going to become diarrhea rather than a bowel obstruction.  His CT head is unremarkable.  I personally reviewed these images as well as the radiologist.  He does not appear septic and his vitals are well.  He is feeling better.  His urine output is much better after fluids as well.  He has been on long-term potassium supplementation so have asked mom to hold this and follow-up with PCP to talk about resuming.  Otherwise, he appears stable for outpatient management.  No old urinary cultures within the last few years to help guide management and so we will send urine culture today. Final Clinical Impression(s) / ED Diagnoses Final diagnoses:  Acute urinary tract infection  Enterocolitis  Hyperkalemia    Rx / DC Orders ED Discharge Orders         Ordered    ondansetron (ZOFRAN ODT) 4 MG disintegrating tablet  Every 8 hours PRN        10/19/20 2115    cephALEXin (KEFLEX) 500 MG capsule  2 times daily        10/19/20 2115           Sherwood Gambler,  MD 10/19/20 2230

## 2020-10-20 LAB — SARS CORONAVIRUS 2 (TAT 6-24 HRS): SARS Coronavirus 2: NEGATIVE

## 2020-10-22 LAB — URINE CULTURE: Culture: >=100000 — AB

## 2020-10-23 ENCOUNTER — Telehealth (HOSPITAL_BASED_OUTPATIENT_CLINIC_OR_DEPARTMENT_OTHER): Payer: Self-pay | Admitting: Emergency Medicine

## 2020-10-23 NOTE — Telephone Encounter (Signed)
Post ED Visit - Positive Culture Follow-up  Culture report reviewed by antimicrobial stewardship pharmacist: [facility] Team []  Elenor Quinones, Pharm.D. []  Heide Guile, Pharm.D., BCPS AQ-ID []  Parks Neptune, Pharm.D., BCPS []  Alycia Rossetti, Pharm.D., BCPS []  [place], [place].D., BCPS, AAHIVP []  Legrand Como, Pharm.D., BCPS, AAHIVP []  Salome Arnt, PharmD, BCPS []  Johnnette Gourd, PharmD, BCPS []  Hughes Better, PharmD, BCPS []  Leeroy Cha, PharmD []  Laqueta Linden, PharmD, BCPS []  Albertina Parr, PharmD  [facility] Team []  Leodis Sias, PharmD []  Lindell Spar, PharmD []  Royetta Asal, PharmD []  Graylin Shiver, Rph []  Rema Fendt) Glennon Mac, PharmD []  Arlyn Dunning, PharmD []  Netta Cedars, PharmD []  Dia Sitter, PharmD []  Leone Haven, PharmD []  Gretta Arab, PharmD []  Theodis Shove, PharmD []  Peggyann Juba, PharmD [x]  Nicoletta Dress, PharmD   Positive urine culture Treated with Cephalexin, organism sensitive to the same and no further patient follow-up is required at this time.  Sandi Raveling Gordy Goar 10/23/2020, 3:23 PM

## 2020-11-16 ENCOUNTER — Other Ambulatory Visit: Payer: Self-pay | Admitting: Urology

## 2020-12-07 NOTE — Progress Notes (Signed)
DUE TO COVID-19 ONLY ONE VISITOR IS ALLOWED TO COME WITH YOU AND STAY IN THE WAITING ROOM ONLY DURING PRE OP AND PROCEDURE DAY OF SURGERY. THE 1 VISITOR  MAY VISIT WITH YOU AFTER SURGERY IN YOUR PRIVATE ROOM DURING VISITING HOURS ONLY!  YOU NEED TO HAVE A COVID 19 TEST ON__4/1/22_____ @_______ , THIS TEST MUST BE DONE BEFORE SURGERY,  COVID TESTING SITE [address] [place] [address], IT IS ON THE RIGHT GOING OUT [address] APPROXIMATELY  2 MINUTES PAST [employer] ON THE RIGHT. ONCE YOUR COVID TEST IS COMPLETED,  PLEASE BEGIN THE QUARANTINE INSTRUCTIONS AS OUTLINED IN YOUR HANDOUT.                [name]  12/07/2020   Your procedure is scheduled on:  12/14/20  Report to [facility] Main  Entrance   Report to admitting at     0800 AM     Call this number if you have problems the morning of surgery [phone]    Remember: Do not eat food , candy gum or mints :After Midnight. You may have clear liquids from midnight until 0700am     CLEAR LIQUID DIET   Foods Allowed                                                                       Coffee and tea, regular and decaf                              Plain Jell-O any favor except red or purple                                            Fruit ices (not with fruit pulp)                                      Iced Popsicles                                     Carbonated beverages, regular and diet                                    Cranberry, grape and apple juices Sports drinks like Gatorade Lightly seasoned clear broth or consume(fat free) Sugar, honey syrup   _____________________________________________________________________    BRUSH YOUR TEETH MORNING OF SURGERY AND RINSE YOUR MOUTH OUT, NO CHEWING GUM CANDY OR MINTS.     Take these medicines the morning of surgery with A SIP OF WATER:  Metoprolol, ditropan  DO NOT  t AKE ANY DIABETIC MEDICATIONS DAY OF YOUR SURGERY                                You may not have any metal  on your body including hair pins and              piercings  Do not wear jewelry, make-up, lotions, powders or perfumes, deodorant             Do not wear nail polish on your fingernails.  Do not shave  48 hours prior to surgery.              Men may shave face and neck.   Do not bring valuables to the hospital. [place].  Contacts, dentures or bridgework may not be worn into surgery.  Leave suitcase in the car. After surgery it may be brought to your room.     Patients discharged the day of surgery will not be allowed to drive home. IF YOU ARE HAVING SURGERY AND GOING HOME THE SAME DAY, YOU MUST HAVE AN ADULT TO DRIVE YOU HOME AND BE WITH YOU FOR 24 HOURS. YOU MAY GO HOME BY TAXI OR UBER OR ORTHERWISE, BUT AN ADULT MUST ACCOMPANY YOU HOME AND STAY WITH YOU FOR 24 HOURS.  Name and phone number of your driver:  Special Instructions: N/A              Please read over the following fact sheets you were given: _____________________________________________________________________  [facility] - Preparing for Surgery Before surgery, you can play an important role.  Because skin is not sterile, your skin needs to be as free of germs as possible.  You can reduce the number of germs on your skin by washing with CHG (chlorahexidine gluconate) soap before surgery.  CHG is an antiseptic cleaner which kills germs and bonds with the skin to continue killing germs even after washing. Please DO NOT use if you have an allergy to CHG or antibacterial soaps.  If your skin becomes reddened/irritated stop using the CHG and inform your nurse when you arrive at Short Stay. Do not shave (including legs and underarms) for at least 48 hours prior to the first CHG shower.  You may shave your face/neck. Please follow these instructions carefully:  1.  Shower with CHG Soap the night before surgery and the  morning of Surgery.  2.   If you choose to wash your hair, wash your hair first as usual with your  normal  shampoo.  3.  After you shampoo, rinse your hair and body thoroughly to remove the  shampoo.                           4.  Use CHG as you would any other liquid soap.  You can apply chg directly  to the skin and wash                       Gently with a scrungie or clean washcloth.  5.  Apply the CHG Soap to your body ONLY FROM THE NECK DOWN.   Do not use on face/ open                           Wound or open sores. Avoid contact with eyes, ears mouth and genitals (private parts).                       Wash face,  Genitals (private parts) with your normal soap.             6.  Wash thoroughly, paying special attention to the area where your surgery  will be performed.  7.  Thoroughly rinse your body with warm water from the neck down.  8.  DO NOT shower/wash with your normal soap after using and rinsing off  the CHG Soap.                9.  Pat yourself dry with a clean towel.            10.  Wear clean pajamas.            11.  Place clean sheets on your bed the night of your first shower and do not  sleep with pets. Day of Surgery : Do not apply any lotions/deodorants the morning of surgery.  Please wear clean clothes to the hospital/surgery center.  FAILURE TO FOLLOW THESE INSTRUCTIONS MAY RESULT IN THE CANCELLATION OF YOUR SURGERY PATIENT SIGNATURE_________________________________  NURSE SIGNATURE__________________________________  ________________________________________________________________________

## 2020-12-09 ENCOUNTER — Encounter (HOSPITAL_COMMUNITY): Payer: Self-pay

## 2020-12-09 ENCOUNTER — Other Ambulatory Visit: Payer: Self-pay

## 2020-12-09 ENCOUNTER — Encounter (HOSPITAL_COMMUNITY)
Admission: RE | Admit: 2020-12-09 | Discharge: 2020-12-09 | Disposition: A | Discharge status: Home / Self Care | Payer: Medicare Other | Source: Ambulatory Visit | Attending: Urology | Admitting: Urology

## 2020-12-09 DIAGNOSIS — Z01812 Encounter for preprocedural laboratory examination: Secondary | ICD-10-CM | POA: Diagnosis present

## 2020-12-09 LAB — BASIC METABOLIC PANEL
Anion gap: 11 (ref 5–15)
BUN: 9 mg/dL (ref 6–20)
CO2: 36 mmol/L — ABNORMAL HIGH (ref 22–32)
Calcium: 9.5 mg/dL (ref 8.9–10.3)
Chloride: 93 mmol/L — ABNORMAL LOW (ref 98–111)
Creatinine, Ser: 0.47 mg/dL — ABNORMAL LOW (ref 0.61–1.24)
GFR, Estimated: >60 mL/min (ref >60)
Glucose, Bld: 131 mg/dL — ABNORMAL HIGH (ref 70–99)
Potassium: 3.6 mmol/L (ref 3.5–5.1)
Sodium: 140 mmol/L (ref 135–145)

## 2020-12-09 LAB — CBC
HCT: 45.5 % (ref 39.0–52.0)
Hemoglobin: 14.2 g/dL (ref 13.0–17.0)
MCH: 25.4 pg — ABNORMAL LOW (ref 26.0–34.0)
MCHC: 31.2 g/dL (ref 30.0–36.0)
MCV: 81.5 fL (ref 80.0–100.0)
Platelets: 259 10*3/uL (ref 150–400)
RBC: 5.58 MIL/uL (ref 4.22–5.81)
RDW: 14.5 % (ref 11.5–15.5)
WBC: 9.7 10*3/uL (ref 4.0–10.5)
nRBC: 0 % (ref 0.0–0.2)

## 2020-12-09 NOTE — Progress Notes (Addendum)
Anesthesia Review:  PCP:    DR Darlina Sicilian  Cardiologist : Chest x-ray : 1 View 10/19/20 EKG :10/19/20 Echo : Stress test: Cardiac Cath :  Activity level:  Sleep Study/ CPAP : Fasting Blood Sugar :      / Checks Blood Sugar -- times a day:   Blood Thinner/ Instructions /Last Dose: ASA / Instructions/ Last Dose :  Allergic to succinylcholine- See allergies  Spina bifida Wheelchair, ileostomy , suprapubic catheter Congenital paraplegia  Last cysto 07/01/20  BMP done 12/09/20 routed to Dr Gloriann Loan.

## 2020-12-13 MED ORDER — SODIUM CHLORIDE 0.9 % IV SOLN
1.0000 g | INTRAVENOUS | Status: AC
  Administered 2020-12-14: 1 g via INTRAVENOUS
  Filled 2020-12-13: qty 1

## 2020-12-14 ENCOUNTER — Ambulatory Visit (HOSPITAL_COMMUNITY): Payer: Medicare Other | Admitting: Anesthesiology

## 2020-12-14 ENCOUNTER — Ambulatory Visit (HOSPITAL_COMMUNITY): Payer: Medicare Other | Admitting: Physician Assistant

## 2020-12-14 ENCOUNTER — Other Ambulatory Visit: Payer: Self-pay

## 2020-12-14 ENCOUNTER — Encounter (HOSPITAL_COMMUNITY): Payer: Self-pay | Admitting: Urology

## 2020-12-14 ENCOUNTER — Encounter (HOSPITAL_COMMUNITY)
Admission: RE | Disposition: A | Discharge status: Home / Self Care | Payer: Self-pay | Source: Other Acute Inpatient Hospital | Attending: Urology

## 2020-12-14 ENCOUNTER — Ambulatory Visit (HOSPITAL_COMMUNITY)
Admission: RE | Admit: 2020-12-14 | Discharge: 2020-12-14 | Disposition: A | Discharge status: Home / Self Care | Payer: Medicare Other | Source: Other Acute Inpatient Hospital | Attending: Urology | Admitting: Urology

## 2020-12-14 DIAGNOSIS — Z982 Presence of cerebrospinal fluid drainage device: Secondary | ICD-10-CM | POA: Insufficient documentation

## 2020-12-14 DIAGNOSIS — Z888 Allergy status to other drugs, medicaments and biological substances status: Secondary | ICD-10-CM | POA: Diagnosis not present

## 2020-12-14 DIAGNOSIS — Z881 Allergy status to other antibiotic agents status: Secondary | ICD-10-CM | POA: Diagnosis not present

## 2020-12-14 DIAGNOSIS — Z91040 Latex allergy status: Secondary | ICD-10-CM | POA: Diagnosis not present

## 2020-12-14 DIAGNOSIS — Q052 Lumbar spina bifida with hydrocephalus: Secondary | ICD-10-CM | POA: Diagnosis not present

## 2020-12-14 DIAGNOSIS — G808 Other cerebral palsy: Secondary | ICD-10-CM | POA: Diagnosis not present

## 2020-12-14 DIAGNOSIS — Z79899 Other long term (current) drug therapy: Secondary | ICD-10-CM | POA: Insufficient documentation

## 2020-12-14 DIAGNOSIS — N319 Neuromuscular dysfunction of bladder, unspecified: Secondary | ICD-10-CM | POA: Diagnosis present

## 2020-12-14 DIAGNOSIS — Z20822 Contact with and (suspected) exposure to covid-19: Secondary | ICD-10-CM | POA: Insufficient documentation

## 2020-12-14 DIAGNOSIS — Z993 Dependence on wheelchair: Secondary | ICD-10-CM | POA: Diagnosis not present

## 2020-12-14 HISTORY — PX: BOTOX INJECTION: SHX5754

## 2020-12-14 LAB — SARS CORONAVIRUS 2 BY RT PCR (HOSPITAL ORDER, PERFORMED IN ~~LOC~~ HOSPITAL LAB): SARS Coronavirus 2: NEGATIVE

## 2020-12-14 SURGERY — BOTOX INJECTION
Anesthesia: Monitor Anesthesia Care | Site: Bladder

## 2020-12-14 MED ORDER — ONABOTULINUMTOXINA 100 UNITS IJ SOLR
INTRAMUSCULAR | Status: AC
  Filled 2020-12-14: qty 200

## 2020-12-14 MED ORDER — CHLORHEXIDINE GLUCONATE 0.12 % MT SOLN
15.0000 mL | Freq: Once | OROMUCOSAL | Status: AC
  Administered 2020-12-14: 15 mL via OROMUCOSAL

## 2020-12-14 MED ORDER — PROPOFOL 10 MG/ML IV BOLUS
INTRAVENOUS | Status: DC | PRN
  Administered 2020-12-14: 20 mg via INTRAVENOUS

## 2020-12-14 MED ORDER — PROPOFOL 500 MG/50ML IV EMUL
INTRAVENOUS | Status: AC
  Filled 2020-12-14: qty 50

## 2020-12-14 MED ORDER — ORAL CARE MOUTH RINSE: 15.0000 mL | Freq: Once | OROMUCOSAL | Status: AC

## 2020-12-14 MED ORDER — FENTANYL CITRATE (PF) 100 MCG/2ML IJ SOLN: 25.0000 ug | INTRAMUSCULAR | Status: DC | PRN

## 2020-12-14 MED ORDER — ONABOTULINUMTOXINA 100 UNITS IJ SOLR
INTRAMUSCULAR | Status: DC | PRN
  Administered 2020-12-14: 300 [IU]

## 2020-12-14 MED ORDER — SODIUM CHLORIDE (PF) 0.9 % IJ SOLN
INTRAMUSCULAR | Status: AC
  Filled 2020-12-14: qty 30

## 2020-12-14 MED ORDER — ONDANSETRON HCL 4 MG/2ML IJ SOLN: 4.0000 mg | Freq: Once | INTRAMUSCULAR | Status: DC | PRN

## 2020-12-14 MED ORDER — SODIUM CHLORIDE (PF) 0.9 % IJ SOLN
INTRAMUSCULAR | Status: DC | PRN
  Administered 2020-12-14: 30 mL

## 2020-12-14 MED ORDER — STERILE WATER FOR IRRIGATION IR SOLN
Status: DC | PRN
  Administered 2020-12-14: 3000 mL

## 2020-12-14 MED ORDER — LACTATED RINGERS IV SOLN: INTRAVENOUS | Status: DC

## 2020-12-14 MED ORDER — PROPOFOL 500 MG/50ML IV EMUL
INTRAVENOUS | Status: DC | PRN
  Administered 2020-12-14: 75 ug/kg/min via INTRAVENOUS

## 2020-12-14 SURGICAL SUPPLY — 16 items
BAG URO CATCHER STRL LF (MISCELLANEOUS) ×3 IMPLANT
CATH FOLEY LATEX FREE 20FR (CATHETERS) ×3
CATH FOLEY LF 20FR (CATHETERS) IMPLANT
CLOTH BEACON ORANGE TIMEOUT ST (SAFETY) ×3 IMPLANT
GOWN STRL REUS W/TWL XL LVL3 (GOWN DISPOSABLE) ×6 IMPLANT
KIT TURNOVER KIT A (KITS) ×3 IMPLANT
MANIFOLD NEPTUNE II (INSTRUMENTS) ×3 IMPLANT
NDL ASPIRATION 22 (NEEDLE) ×1 IMPLANT
NDL SAFETY ECLIPSE 18X1.5 (NEEDLE) IMPLANT
NEEDLE ASPIRATION 22 (NEEDLE) ×3 IMPLANT
NEEDLE HYPO 18GX1.5 SHARP (NEEDLE) ×3
PACK CYSTO (CUSTOM PROCEDURE TRAY) ×3 IMPLANT
SYR CONTROL 10ML LL (SYRINGE) IMPLANT
TUBING CONNECTING 10 (TUBING) ×1 IMPLANT
TUBING CONNECTING 10' (TUBING) ×1
WATER STERILE IRR 3000ML UROMA (IV SOLUTION) ×3 IMPLANT

## 2020-12-14 NOTE — Transfer of Care (Signed)
Immediate Anesthesia Transfer of Care Note  Patient: [name]  Procedure(s) Performed: BOTOX INJECTION 300 UNITS SUPRAPUBIC TUBE CHANGE (N/A Bladder)  Patient Location: PACU  Anesthesia Type:General  Level of Consciousness: awake, alert , oriented and patient cooperative  Airway & Oxygen Therapy: Patient Spontanous Breathing and Patient connected to face mask  Post-op Assessment: Report given to RN and Post -op Vital signs reviewed and stable  Post vital signs: Reviewed and stable  Last Vitals:  Vitals Value Taken Time  BP 147/69 12/14/20 1050  Temp    Pulse 83 12/14/20 1052  Resp 18 12/14/20 1052  SpO2 100 % 12/14/20 1052  Vitals shown include unvalidated device data.  Last Pain:  Vitals:   12/14/20 0735  TempSrc: Oral         Complications: No complications documented.

## 2020-12-14 NOTE — Anesthesia Preprocedure Evaluation (Addendum)
Anesthesia Evaluation  Patient identified by MRN, date of birth, ID band Patient awake    Reviewed: Allergy & Precautions, NPO status , Patient's Chart, lab work & pertinent test results, reviewed documented beta blocker date and time   History of Anesthesia Complications Negative for: history of anesthetic complications  Airway Mallampati: III  TM Distance: >3 FB Neck ROM: Full    Dental  (+) Dental Advisory Given, Missing   Pulmonary neg pulmonary ROS,    Pulmonary exam normal        Cardiovascular hypertension, Pt. on home beta blockers and Pt. on medications Normal cardiovascular exam     Neuro/Psych  Headaches, Seizures - (none since age 40), Well Controlled,   Spina bifida Paraplegic   negative psych ROS   GI/Hepatic Neg liver ROS, GERD  Controlled,  Endo/Other  negative endocrine ROS  Renal/GU Renal disease    Neurogenic bladder     Musculoskeletal negative musculoskeletal ROS (+)   Abdominal   Peds  Hematology negative hematology ROS (+)   Anesthesia Other Findings Hx prolonged paralysis following succinylcholine Covid test negative   Reproductive/Obstetrics                            Anesthesia Physical Anesthesia Plan  ASA: III  Anesthesia Plan: MAC   Post-op Pain Management:    Induction: Intravenous  PONV Risk Score and Plan: 1 and Propofol infusion and Treatment may vary due to age or medical condition  Airway Management Planned: Natural Airway and Simple Face Mask  Additional Equipment: None  Intra-op Plan:   Post-operative Plan:   Informed Consent: I have reviewed the patients History and Physical, chart, labs and discussed the procedure including the risks, benefits and alternatives for the proposed anesthesia with the patient or authorized representative who has indicated his/her understanding and acceptance.       Plan Discussed with: CRNA and  Anesthesiologist  Anesthesia Plan Comments:        Anesthesia Quick Evaluation

## 2020-12-14 NOTE — H&P (Signed)
H&P Chief Complaint: Neurogenic bladder  History of Present Illness: 40 year old male with neurogenic bladder managed with Botox and suprapubic tube presents for repeat Botox and suprapubic tube exchange.  Past Medical History:  Diagnosis Date  . Arnold-Chiari malformation ([place])   . 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 GERD (gastroesophageal reflux disease)   . Heart palpitations    SECONDARY TO VP SHUNT DRAIN  . History of seizure    per pt mother, no seizures since age 26  . Hypertension   . Ileostomy in place [facility]) 2014  . Migraines followed by neurologist @[facility]    taking verapamil  . Neurogenic bladder    INCONTINENT -  . Neurogenic bowel   . S/P VP shunt   . 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 Urinary incontinence with continuous leakage   . Wears glasses   . Wheelchair bound    CAN TRASFER SELF WITHOUT BOARD   Past Surgical History:  Procedure Laterality Date  . BACK SURGERY  infant   "get skin to stretch over his back; spinal bifida"  . BACK SURGERY  age 27   scoliosis and  removed hump on his back (kyphosis) and put rods in to stabalize back"  . BOTOX INJECTION N/A 11/14/2018   Procedure: BOTOX INJECTION CYSTOSCOPY;  Surgeon: Lucas Mallow, MD;  Location: [facility];  Service: Urology;  Laterality: N/A;  . BOTOX INJECTION N/A 05/22/2019   Procedure: BOTOX INJECTION;  Surgeon: Lucas Mallow, MD;  Location: [facility];  Service: Urology;  Laterality: N/A;  . BOTOX INJECTION N/A 11/13/2019   Procedure: CYSTOSCOPY BOTOX INJECTION;  Surgeon: Lucas Mallow, MD;  Location: [facility];  Service: Urology;  Laterality: N/A;  . BOTOX INJECTION N/A 07/01/2020   Procedure: CYSTOSCOPY, BOTOX INJECTION, BLADDER FULGERATION, SUPRAPUBIC TUBE EXCHANGE;  Surgeon: Ceasar Mons, MD;  Location: [facility];  Service: Urology;  Laterality: N/A;  . CHOLECYSTECTOMY N/A  07/06/2013   Procedure: LAPAROSCOPIC CHOLECYSTECTOMY WITH INTRAOPERATIVE CHOLANGIOGRAM;  Surgeon: Madilyn Hook, DO;  Location: [facility];  Service: General;  Laterality: N/A;  . COLON RESECTION N/A 07/08/2013   Procedure: EXPLORATORY LAPAROSCOPY, DIAGNOSTIC LAPAROSCOPY, PARTIAL COLECTOMY, ABDOMINAL WASHOUT, ABDOMINAL WOUND VAC;  Surgeon: Madilyn Hook, DO;  Location: [facility];  Service: General;  Laterality: N/A;  . HIP SURGERY Bilateral AS CHILD   TENDON RELEASE  . HOLMIUM LASER APPLICATION N/A 93/[id number]/6967   Procedure: HOLMIUM LASER OF BLADDER STONE;  Surgeon: Ceasar Mons, MD;  Location: [facility];  Service: Urology;  Laterality: N/A;  . INCISION AND DRAINAGE OF WOUND N/A 11/04/2013   Procedure: IRRIGATION AND DEBRIDEMENT ABDOMINAL WOUND WITH PLACEMENT OF ACELL/VAC;  Surgeon: Theodoro Kos, DO;  Location: [place];  Service: Plastics;  Laterality: N/A;  . INSERTION OF SUPRAPUBIC CATHETER N/A 12/23/2019   Procedure: FLEXIBLE CYSTOSCOPE WITH FOLEY PLACEMENT OVER WIRE.;  Surgeon: Robley Fries, MD;  Location: [facility];  Service: Urology;  Laterality: N/A;  . IR CATHETER TUBE CHANGE  02/15/2019  . IR CATHETER TUBE CHANGE  03/01/2019  . IR CATHETER TUBE CHANGE  05/06/2019  . IR CATHETER TUBE CHANGE  03/06/2020  . IR CATHETER TUBE CHANGE  04/16/2020  . IR CATHETER TUBE CHANGE  04/29/2020  . 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 RIGHT COLECTOMY/  APPENDECTOMY/  ILEOSTOMY  NOV 2014  [facility]   surgical complication with cholecystectomy  . UMBILICAL HERNIA REPAIR  child  . VENTRICULOPERITONEAL SHUNT  11-12-2018 per pt and mother---LAST REVISION ~ 06/2015   DUE TO ABD WOUND--- SHUNT DRAINS TO HEART (12/23/2015)    Home Medications:  Medications Prior to Admission  Medication Sig Dispense Refill Last Dose  . acetaminophen (TYLENOL) 650 MG CR tablet Take 1,300 mg by mouth every 8 (eight) hours as needed for pain.   Past Week at Unknown time  . Ascorbic Acid (VITAMIN C)  1000 MG tablet Take 1,000 mg by mouth daily with lunch.   12/13/2020 at Unknown time  . calcium elemental as carbonate (CALCIUM ANTACID ULTRA STRENGTH) 400 MG chewable tablet Chew 1,000 mg by mouth 3 (three) times daily.   12/13/2020 at Unknown time  . calcium-vitamin D (OSCAL WITH D) 500-200 MG-UNIT tablet Take 1 tablet by mouth daily in the afternoon.   12/13/2020 at Unknown time  . Cranberry-Vitamin C-Vitamin E 4200-20-3 MG-MG-UNIT CAPS Take 1 capsule by mouth in the morning, at noon, and at bedtime.   12/13/2020 at Unknown time  . docusate sodium (COLACE) 100 MG capsule Take 200 mg by mouth in the morning.   12/13/2020 at Unknown time  . metoprolol tartrate (LOPRESSOR) 50 MG tablet Take 50 mg by mouth 2 (two) times daily.   12/14/2020 at 0600  . Multiple Vitamin (MULTIVITAMIN WITH MINERALS) TABS tablet Take 1 tablet by mouth daily in the afternoon.   12/13/2020 at Unknown time  . ondansetron (ZOFRAN ODT) 4 MG disintegrating tablet Take 1 tablet (4 mg total) by mouth every 8 (eight) hours as needed for nausea or vomiting. 10 tablet 0 Past Month at Unknown time  . oxybutynin (DITROPAN) 5 MG tablet Take 5 mg by mouth in the morning, at noon, and at bedtime.    12/13/2020 at Unknown time  . rosuvastatin (CRESTOR) 20 MG tablet Take 20 mg by mouth in the morning.   12/13/2020 at Unknown time  . verapamil (CALAN-SR) 120 MG CR tablet Take 120 mg by mouth at bedtime.   12/13/2020 at Unknown time   Allergies:  Allergies  Allergen Reactions  . Latex Hives  . Succinylcholine Rash    Other reaction(s): Other (See Comments) Pt is half [place] - prolonged paralysis due to compromised metabolism. Succinylcholine given on 07/07/15 at outside hospital and pt experienced paralysis > 3 hours.  Due to being native [place], his mother states that he takes longer to "come out of it"  . Ciprofloxacin Other (See Comments)    IV only-- caused burning in arm   . Vancomycin Itching and Swelling  . Adhesive [Tape] Hives and Rash     Family History  Problem Relation Age of Onset  . Diabetes Other   . Hypertension Other   . Cancer Other   . Stroke Other    Social History:  reports that he has never smoked. He has never used smokeless tobacco. He reports that he does not drink alcohol and does not use drugs.  ROS: A complete review of systems was performed.  All systems are negative except for pertinent findings as noted. ROS   Physical Exam:  Vital signs in last 24 hours: Temp:  [98.4 F (36.9 C)] 98.4 F (36.9 C) (04/04 0735) Pulse Rate:  [78] 78 (04/04 0735) Resp:  [18] 18 (04/04 0735) BP: (116)/(65) 116/65 (04/04 0735) SpO2:  [97 %] 97 % (04/04 0735) Weight:  [74 kg] 74 kg (04/04 0800) General:  Alert  and oriented, No acute distress HEENT: Normocephalic, atraumatic Neck: No JVD or lymphadenopathy Cardiovascular: Regular rate and rhythm Lungs: Regular rate and effort Back: No CVA tenderness   Laboratory Data:  Results for orders placed or performed during the hospital encounter of 12/14/20 (from the past 24 hour(s))  SARS Coronavirus 2 by RT PCR (hospital order, performed in [facility] hospital lab) Nasopharyngeal Nasopharyngeal Swab     Status: None   Collection Time: 12/14/20  7:26 AM   Specimen: Nasopharyngeal Swab  Result Value Ref Range   SARS Coronavirus 2 NEGATIVE NEGATIVE   Recent Results (from the past 240 hour(s))  SARS Coronavirus 2 by RT PCR (hospital order, performed in [facility] hospital lab) Nasopharyngeal Nasopharyngeal Swab     Status: None   Collection Time: 12/14/20  7:26 AM   Specimen: Nasopharyngeal Swab  Result Value Ref Range Status   SARS Coronavirus 2 NEGATIVE NEGATIVE Final    Comment: (NOTE) SARS-CoV-2 target nucleic acids are NOT DETECTED.  The SARS-CoV-2 RNA is generally detectable in upper and lower respiratory specimens during the acute phase of infection. The lowest concentration of SARS-CoV-2 viral copies this assay can detect is 250 copies / mL. A  negative result does not preclude SARS-CoV-2 infection and should not be used as the sole basis for treatment or other patient management decisions.  A negative result may occur with improper specimen collection / handling, submission of specimen other than nasopharyngeal swab, presence of viral mutation(s) within the areas targeted by this assay, and inadequate number of viral copies (<250 copies / mL). A negative result must be combined with clinical observations, patient history, and epidemiological information.  Fact Sheet for Patients:   StrictlyIdeas.no  Fact Sheet for Healthcare Providers: BankingDealers.co.za  This test is not yet approved or  cleared by the [place] FDA and has been authorized for detection and/or diagnosis of SARS-CoV-2 by FDA under an Emergency Use Authorization (EUA).  This EUA will remain in effect (meaning this test can be used) for the duration of the COVID-19 declaration under Section 564(b)(1) of the Act, 21 U.S.C. section 360bbb-3(b)(1), unless the authorization is terminated or revoked sooner.  Performed at [facility], [place] [address], [place], [place] [address]    Creatinine: Recent Labs    12/09/20 1430  CREATININE 0.47*    Impression/Assessment:  Neurogenic bladder  Plan:  Proceed with intravesical Botox and suprapubic tube exchange  Marton Redwood, III 12/14/2020, 10:04 AM

## 2020-12-14 NOTE — Op Note (Signed)
Operative Note  Preoperative diagnosis:  1.  Neurogenic bladder  Postoperative diagnosis: 1.  Neurogenic bladder  Procedure(s): 1.  Cystoscopy with 300 units Botox injection, suprapubic tube exchange simple  Surgeon: Link Snuffer, MD  Assistants: None  Anesthesia: Moderate sedation  Complications: None immediate  EBL: Minimal  Specimens: 1.  None  Drains/Catheters: 1.  20 French silicone catheter as suprapubic tube  Intraoperative findings: 1.  False passage just prior to the prostatic urethra.  The prostatic urethra was wide open and he had a stiff high riding bladder neck.  Bladder was inflamed from chronic inflammation.  No evidence of malignancy.  Bladder was low capacity.  Indication: 40 year old male with neurogenic bladder managed with intradetrusor Botox and suprapubic tube presents for the above operation  Description of procedure:  The patient was identified and consent was obtained.  The patient was taken to the operating room and placed in the supine position.  The patient was placed under moderate sedation anesthesia.  Perioperative antibiotics were administered.  The patient was placed in dorsal lithotomy.  Patient was prepped and draped in a standard sterile fashion and a timeout was performed.  A cystoscope was advanced into the urethra and into the bladder.  Findings are noted above.  300 units of Botox were then injected systematically throughout the bladder.  I then placed a 20 French silicone catheter through the suprapubic tract and into the bladder.  Placement was confirmed with cystoscopy.  The scope was withdrawn and this concluded the operation.  Patient tolerated the procedure well and was stable postoperative.  Plan: Follow-up in 1 month for suprapubic tube exchange.  Repeat Botox in about 6 months.

## 2020-12-14 NOTE — Discharge Instructions (Signed)

## 2020-12-15 ENCOUNTER — Encounter (HOSPITAL_COMMUNITY): Payer: Self-pay | Admitting: Urology

## 2020-12-15 NOTE — Anesthesia Postprocedure Evaluation (Signed)
Anesthesia Post Note  Patient: [name]  Procedure(s) Performed: BOTOX INJECTION 300 UNITS SUPRAPUBIC TUBE CHANGE (N/A Bladder)     Patient location during evaluation: PACU Anesthesia Type: MAC Level of consciousness: awake and alert Pain management: pain level controlled Vital Signs Assessment: post-procedure vital signs reviewed and stable Respiratory status: spontaneous breathing, nonlabored ventilation and respiratory function stable Cardiovascular status: stable and blood pressure returned to baseline Anesthetic complications: no   No complications documented.  Last Vitals:  Vitals:   12/14/20 1211 12/14/20 1212  BP:  119/73  Pulse: 75 76  Resp:    Temp:    SpO2: 100% 100%    Last Pain:  Vitals:   12/14/20 1210  TempSrc:   PainSc: 0-No pain                 Audry Pili

## 2021-01-12 ENCOUNTER — Emergency Department (HOSPITAL_COMMUNITY): Payer: Medicare Other

## 2021-01-12 ENCOUNTER — Encounter (HOSPITAL_COMMUNITY): Payer: Self-pay | Admitting: *Deleted

## 2021-01-12 ENCOUNTER — Emergency Department (HOSPITAL_COMMUNITY)
Admission: EM | Admit: 2021-01-12 | Discharge: 2021-01-12 | Disposition: A | Discharge status: Home / Self Care | Payer: Medicare Other | Attending: Emergency Medicine | Admitting: Emergency Medicine

## 2021-01-12 ENCOUNTER — Other Ambulatory Visit: Payer: Self-pay

## 2021-01-12 DIAGNOSIS — Z91040 Latex allergy status: Secondary | ICD-10-CM | POA: Insufficient documentation

## 2021-01-12 DIAGNOSIS — H53149 Visual discomfort, unspecified: Secondary | ICD-10-CM | POA: Diagnosis not present

## 2021-01-12 DIAGNOSIS — T83511A Infection and inflammatory reaction due to indwelling urethral catheter, initial encounter: Secondary | ICD-10-CM | POA: Insufficient documentation

## 2021-01-12 DIAGNOSIS — R109 Unspecified abdominal pain: Secondary | ICD-10-CM | POA: Diagnosis not present

## 2021-01-12 DIAGNOSIS — R110 Nausea: Secondary | ICD-10-CM | POA: Diagnosis not present

## 2021-01-12 DIAGNOSIS — Y743 Surgical instruments, materials and general hospital and personal-use devices (including sutures) associated with adverse incidents: Secondary | ICD-10-CM | POA: Insufficient documentation

## 2021-01-12 DIAGNOSIS — R519 Headache, unspecified: Secondary | ICD-10-CM | POA: Insufficient documentation

## 2021-01-12 DIAGNOSIS — I129 Hypertensive chronic kidney disease with stage 1 through stage 4 chronic kidney disease, or unspecified chronic kidney disease: Secondary | ICD-10-CM | POA: Insufficient documentation

## 2021-01-12 DIAGNOSIS — D631 Anemia in chronic kidney disease: Secondary | ICD-10-CM | POA: Diagnosis not present

## 2021-01-12 DIAGNOSIS — E871 Hypo-osmolality and hyponatremia: Secondary | ICD-10-CM

## 2021-01-12 DIAGNOSIS — Z79899 Other long term (current) drug therapy: Secondary | ICD-10-CM | POA: Diagnosis not present

## 2021-01-12 DIAGNOSIS — N189 Chronic kidney disease, unspecified: Secondary | ICD-10-CM | POA: Insufficient documentation

## 2021-01-12 DIAGNOSIS — N390 Urinary tract infection, site not specified: Secondary | ICD-10-CM

## 2021-01-12 LAB — COMPREHENSIVE METABOLIC PANEL
ALT: 49 U/L — ABNORMAL HIGH (ref 0–44)
AST: 47 U/L — ABNORMAL HIGH (ref 15–41)
Albumin: 4.2 g/dL (ref 3.5–5.0)
Alkaline Phosphatase: 73 U/L (ref 38–126)
Anion gap: 15 (ref 5–15)
BUN: 51 mg/dL — ABNORMAL HIGH (ref 6–20)
CO2: 19 mmol/L — ABNORMAL LOW (ref 22–32)
Calcium: 9.4 mg/dL (ref 8.9–10.3)
Chloride: 92 mmol/L — ABNORMAL LOW (ref 98–111)
Creatinine, Ser: 0.8 mg/dL (ref 0.61–1.24)
GFR, Estimated: >60 mL/min (ref >60)
Glucose, Bld: 108 mg/dL — ABNORMAL HIGH (ref 70–99)
Potassium: 5.4 mmol/L — ABNORMAL HIGH (ref 3.5–5.1)
Sodium: 126 mmol/L — ABNORMAL LOW (ref 135–145)
Total Bilirubin: 1.2 mg/dL (ref 0.3–1.2)
Total Protein: 9 g/dL — ABNORMAL HIGH (ref 6.5–8.1)

## 2021-01-12 LAB — CBC WITH DIFFERENTIAL/PLATELET
Abs Immature Granulocytes: 0.08 10*3/uL — ABNORMAL HIGH (ref 0.00–0.07)
Basophils Absolute: 0 10*3/uL (ref 0.0–0.1)
Basophils Relative: 0 %
Eosinophils Absolute: 0.1 10*3/uL (ref 0.0–0.5)
Eosinophils Relative: 1 %
HCT: 51.2 % (ref 39.0–52.0)
Hemoglobin: 16.5 g/dL (ref 13.0–17.0)
Immature Granulocytes: 1 %
Lymphocytes Relative: 12 %
Lymphs Abs: 1.1 10*3/uL (ref 0.7–4.0)
MCH: 25.1 pg — ABNORMAL LOW (ref 26.0–34.0)
MCHC: 32.2 g/dL (ref 30.0–36.0)
MCV: 77.8 fL — ABNORMAL LOW (ref 80.0–100.0)
Monocytes Absolute: 1 10*3/uL (ref 0.1–1.0)
Monocytes Relative: 11 %
Neutro Abs: 7.2 10*3/uL (ref 1.7–7.7)
Neutrophils Relative %: 75 %
Platelets: 270 10*3/uL (ref 150–400)
RBC: 6.58 MIL/uL — ABNORMAL HIGH (ref 4.22–5.81)
RDW: 15.8 % — ABNORMAL HIGH (ref 11.5–15.5)
WBC: 9.6 10*3/uL (ref 4.0–10.5)
nRBC: 0 % (ref 0.0–0.2)

## 2021-01-12 LAB — URINALYSIS, ROUTINE W REFLEX MICROSCOPIC
Glucose, UA: NEGATIVE mg/dL
Ketones, ur: 5 mg/dL — AB
Nitrite: NEGATIVE
Protein, ur: >=300 mg/dL — AB
Specific Gravity, Urine: 1.025 (ref 1.005–1.030)
WBC, UA: >50 WBC/hpf — ABNORMAL HIGH (ref 0–5)
pH: 7 (ref 5.0–8.0)

## 2021-01-12 LAB — BLOOD GAS, ARTERIAL
Acid-base deficit: 3.4 mmol/L — ABNORMAL HIGH (ref 0.0–2.0)
Bicarbonate: 21.9 mmol/L (ref 20.0–28.0)
Drawn by: 308601
FIO2: 21
O2 Saturation: 96.8 %
Patient temperature: 98.6
pCO2 arterial: 42 mmHg (ref 32.0–48.0)
pH, Arterial: 7.337 — ABNORMAL LOW (ref 7.350–7.450)
pO2, Arterial: 106 mmHg (ref 83.0–108.0)

## 2021-01-12 LAB — C DIFFICILE QUICK SCREEN W PCR REFLEX
C Diff antigen: NEGATIVE
C Diff interpretation: NOT DETECTED
C Diff toxin: NEGATIVE

## 2021-01-12 LAB — LIPASE, BLOOD: Lipase: 23 U/L (ref 11–51)

## 2021-01-12 MED ORDER — SODIUM CHLORIDE 0.9 % IV BOLUS
1000.0000 mL | Freq: Once | INTRAVENOUS | Status: AC
  Administered 2021-01-12: 1000 mL via INTRAVENOUS

## 2021-01-12 MED ORDER — CEFPODOXIME PROXETIL 200 MG PO TABS: 200.0000 mg | ORAL_TABLET | Freq: Two times a day (BID) | ORAL | 0 refills | Status: AC

## 2021-01-12 MED ORDER — SODIUM CHLORIDE 0.9 % IV SOLN
1.0000 g | Freq: Once | INTRAVENOUS | Status: AC
  Administered 2021-01-12: 1 g via INTRAVENOUS
  Filled 2021-01-12: qty 10

## 2021-01-12 NOTE — ED Notes (Signed)
[facility] called  

## 2021-01-12 NOTE — Discharge Instructions (Addendum)
Your salt level is slightly low.  Please stay hydrated.  Recheck sodium in a week.   Take Vantin as prescribed.  Please go to your urology appointment tomorrow and get the suprapubic catheter replaced  Return to ER if you have worse abdominal pain, fever, vomiting, dehydration

## 2021-01-12 NOTE — ED Notes (Signed)
Pt and pt mother decided not to wait on [facility]. Pt assisted into pt car with staff. Pt mother stated she had family to help get pt out of car at home.

## 2021-01-12 NOTE — ED Provider Notes (Signed)
[facility] DEPT Provider Note   CSN: [id number] Arrival date & time: 01/12/21  1203     History Chief Complaint  Patient presents with  . Headache  . Nausea    [name] is a 40 y.o. male.  Dorene Ar Snowdon has a suprapubic catheter and an ileostomy.  For the past few days, his suprapubic catheter has been intermittently clogged.  His mother has been managing to unclog it at home, but the urine is dark and has sedimentation.  He has also been complaining of some abdominal pain.  He has had decreased intake and has started to complain about a right-sided headache and photophobia.  He has been exposed to multiple family members and close contacts who have had gastrointestinal symptoms.  He is due to have his catheter changed tomorrow.  The history is provided by the patient and a parent.  Male GU Problem Presenting symptoms: no dysuria   Presenting symptoms comment:  Suprapubic catheter not emptying Context: spontaneously   Relieved by: flushing. Worsened by:  Nothing Associated symptoms: abdominal pain (right-sided) and nausea   Associated symptoms: no diarrhea, no fever, no hematuria and no vomiting        Past Medical History:  Diagnosis Date  . Arnold-Chiari malformation ([place])   . 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 GERD (gastroesophageal reflux disease)   . Heart palpitations    SECONDARY TO VP SHUNT DRAIN  . History of seizure    per pt mother, no seizures since age 73  . Hypertension   . Ileostomy in place [facility]) 2014  . Migraines followed by neurologist @[facility]    taking verapamil  . Neurogenic bladder    INCONTINENT -  . Neurogenic bowel   . S/P VP shunt   . 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 Urinary incontinence with continuous leakage   . Wears glasses   . Wheelchair bound    CAN TRASFER SELF WITHOUT BOARD     Patient Active Problem List   Diagnosis Date Noted  . Essential hypertension 12/23/2015  . Hypotension 12/23/2015  . Decubitus ulcer of buttock, stage 3 ([place]) 11/20/2015  . Complicated UTI (urinary tract infection)   . Sepsis secondary to UTI ([place]) 11/19/2015  . Sepsis ([place]) 07/22/2015  . AKI (acute kidney injury) ([place]) 07/22/2015  . Hyperkalemia 07/22/2015  . Hyponatremia 07/22/2015  . Thrombocytosis 07/22/2015  . Leukocytosis 07/22/2015  . Borderline diabetes 07/22/2015  . Arnold-Chiari malformation ([place]) 07/22/2015  . Urinary tract infectious disease   . Headache 11/06/2014  . H/O ventricular shunt   . 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 Severe sepsis ([place]) 10/26/2014  . Renal failure (ARF), acute on chronic (HCC) 10/26/2014  . Severe sepsis with septic shock ([place]) 10/26/2014  . UTI (lower urinary tract infection)   . Anemia in chronic renal disease 09/30/2013  . Esophageal reflux 09/17/2013  . Protein-calorie malnutrition, severe ([place]) 09/17/2013  . Essential hypertension, benign 09/17/2013  . Anemia of other chronic disease 09/17/2013  . Septic shock ([place]) 07/09/2013  . Spina bifida ([place]) 07/07/2013  . Neurogenic bladder 07/07/2013  . Elevated LFTs 07/07/2013    Past Surgical History:  Procedure Laterality Date  . BACK SURGERY  infant   "get skin to stretch over his back; spinal bifida"  . BACK SURGERY  age 64   scoliosis  and  removed hump on his back (kyphosis) and put rods in to stabalize back"  . BOTOX INJECTION N/A 11/14/2018   Procedure: BOTOX INJECTION CYSTOSCOPY;  Surgeon: Lucas Mallow, MD;  Location: [facility];  Service: Urology;  Laterality: N/A;  . BOTOX INJECTION N/A 05/22/2019   Procedure: BOTOX INJECTION;  Surgeon: Lucas Mallow, MD;  Location: [facility];  Service: Urology;  Laterality: N/A;  . BOTOX INJECTION N/A 11/13/2019   Procedure: CYSTOSCOPY BOTOX INJECTION;  Surgeon: Lucas Mallow, MD;  Location: [facility];  Service: Urology;   Laterality: N/A;  . BOTOX INJECTION N/A 07/01/2020   Procedure: CYSTOSCOPY, BOTOX INJECTION, BLADDER FULGERATION, SUPRAPUBIC TUBE EXCHANGE;  Surgeon: Ceasar Mons, MD;  Location: [facility];  Service: Urology;  Laterality: N/A;  . BOTOX INJECTION N/A 12/14/2020   Procedure: BOTOX INJECTION 300 UNITS SUPRAPUBIC TUBE CHANGE;  Surgeon: Lucas Mallow, MD;  Location: [facility];  Service: Urology;  Laterality: N/A;  . CHOLECYSTECTOMY N/A 07/06/2013   Procedure: LAPAROSCOPIC CHOLECYSTECTOMY WITH INTRAOPERATIVE CHOLANGIOGRAM;  Surgeon: Madilyn Hook, DO;  Location: [facility];  Service: General;  Laterality: N/A;  . COLON RESECTION N/A 07/08/2013   Procedure: EXPLORATORY LAPAROSCOPY, DIAGNOSTIC LAPAROSCOPY, PARTIAL COLECTOMY, ABDOMINAL WASHOUT, ABDOMINAL WOUND VAC;  Surgeon: Madilyn Hook, DO;  Location: [facility];  Service: General;  Laterality: N/A;  . HIP SURGERY Bilateral AS CHILD   TENDON RELEASE  . HOLMIUM LASER APPLICATION N/A 86/[id number]/[id number]   Procedure: HOLMIUM LASER OF BLADDER STONE;  Surgeon: Ceasar Mons, MD;  Location: [facility];  Service: Urology;  Laterality: N/A;  . INCISION AND DRAINAGE OF WOUND N/A 11/04/2013   Procedure: IRRIGATION AND DEBRIDEMENT ABDOMINAL WOUND WITH PLACEMENT OF ACELL/VAC;  Surgeon: Theodoro Kos, DO;  Location: [facility];  Service: Plastics;  Laterality: N/A;  . INSERTION OF SUPRAPUBIC CATHETER N/A 12/23/2019   Procedure: FLEXIBLE CYSTOSCOPE WITH FOLEY PLACEMENT OVER WIRE.;  Surgeon: Robley Fries, MD;  Location: [facility];  Service: Urology;  Laterality: N/A;  . IR CATHETER TUBE CHANGE  02/15/2019  . IR CATHETER TUBE CHANGE  03/01/2019  . IR CATHETER TUBE CHANGE  05/06/2019  . IR CATHETER TUBE CHANGE  03/06/2020  . IR CATHETER TUBE CHANGE  04/16/2020  . IR CATHETER TUBE CHANGE  04/29/2020  . 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 RIGHT COLECTOMY/  APPENDECTOMY/  ILEOSTOMY  NOV 2014  [facility]   surgical complication with  cholecystectomy  . UMBILICAL HERNIA REPAIR  child  . VENTRICULOPERITONEAL SHUNT  11-12-2018 per pt and mother---LAST REVISION ~ 06/2015   DUE TO ABD WOUND--- SHUNT DRAINS TO HEART (12/23/2015)       Family History  Problem Relation Age of Onset  . Diabetes Other   . Hypertension Other   . Cancer Other   . Stroke Other     Social History   Tobacco Use  . Smoking status: Never Smoker  . Smokeless tobacco: Never Used  Vaping Use  . Vaping Use: Never used  Substance Use Topics  . Alcohol use: No  . Drug use: Never    Home Medications Prior to Admission medications   Medication Sig Start Date End Date Taking? Authorizing Provider  acetaminophen (TYLENOL) 650 MG CR tablet Take 1,300 mg by mouth every 8 (eight) hours as needed for pain.   Yes [provider]  Ascorbic Acid (VITAMIN C) 1000 MG tablet Take 1,000 mg by mouth daily with lunch.   Yes [provider]  calcium elemental as carbonate (CALCIUM ANTACID ULTRA STRENGTH) 400 MG chewable tablet Chew 1,000 mg by mouth 3 (three) times daily.   Yes [provider]  calcium-vitamin D (OSCAL WITH D) 500-200 MG-UNIT tablet Take 1 tablet by mouth daily in the afternoon.   Yes [provider]  Cranberry 300 MG tablet Take 600 mg by mouth in the morning, at noon, and at bedtime.   Yes [provider]  Cranberry-Vitamin C-Vitamin E 4200-20-3 MG-MG-UNIT CAPS Take 1 capsule by mouth in the morning, at noon, and at bedtime.   Yes [provider]  docusate sodium (COLACE) 100 MG capsule Take 200 mg by mouth in the morning.   Yes [provider]  metoprolol tartrate (LOPRESSOR) 50 MG tablet Take 50 mg by mouth 2 (two) times daily.   Yes [provider]  Multiple Vitamin (MULTIVITAMIN WITH MINERALS) TABS tablet Take 1 tablet by mouth daily in the afternoon.   Yes [provider]  ondansetron (ZOFRAN ODT) 4 MG disintegrating tablet Take 1 tablet (4 mg total) by mouth  every 8 (eight) hours as needed for nausea or vomiting. 10/19/20  Yes Sherwood Gambler, MD  oxybutynin (DITROPAN) 5 MG tablet Take 5 mg by mouth in the morning, at noon, and at bedtime.  10/03/19  Yes [provider]  rosuvastatin (CRESTOR) 20 MG tablet Take 20 mg by mouth in the morning. 09/10/18  Yes [provider]  verapamil (CALAN-SR) 120 MG CR tablet Take 120 mg by mouth at bedtime. 09/26/19  Yes [provider]    Allergies    Latex, Succinylcholine, Ciprofloxacin, Vancomycin, and Adhesive [tape]  Review of Systems   Review of Systems  Constitutional: Positive for appetite change. Negative for chills and fever.  HENT: Negative for ear pain and sore throat.   Eyes: Negative for pain and visual disturbance.  Respiratory: Negative for cough and shortness of breath.   Cardiovascular: Negative for chest pain and palpitations.  Gastrointestinal: Positive for abdominal pain (right-sided) and nausea. Negative for diarrhea and vomiting.  Genitourinary: Positive for difficulty urinating. Negative for dysuria and hematuria.  Musculoskeletal: Negative for arthralgias and back pain.  Skin: Negative for color change and rash.  Neurological: Positive for headaches. Negative for seizures and syncope.  All other systems reviewed and are negative.   Physical Exam Updated Vital Signs BP (!) 135/96 (BP Location: Left Arm)   Pulse 79   Temp 97.8 F (36.6 C) (Oral)   Resp 20   Ht 4\' 10"  (1.473 m)   Wt 72.6 kg   SpO2 97%   BMI 33.44 kg/m   Physical Exam Vitals and nursing note reviewed.  Constitutional:      Appearance: He is well-developed.  HENT:     Head: Normocephalic and atraumatic.  Eyes:     Conjunctiva/sclera: Conjunctivae normal.  Cardiovascular:     Rate and Rhythm: Normal rate and regular rhythm.     Heart sounds: No murmur heard.   Pulmonary:     Effort: Pulmonary effort is normal. No respiratory distress.     Breath sounds: Normal breath sounds.   Abdominal:     Palpations: Abdomen is soft.     Tenderness: There is abdominal tenderness.     Comments: Very minimal tenderness on the right side of his abdomen.  No guarding or rebound  Musculoskeletal:     Cervical back: Neck supple.  Skin:    General: Skin is warm and dry.  Neurological:     Mental  Status: He is alert.     Comments: Paresis of the lower extremities, but no new neurologic deficits  Psychiatric:        Mood and Affect: Mood normal.     ED Results / Procedures / Treatments   Labs (all labs ordered are listed, but only abnormal results are displayed) Labs Reviewed  BLOOD GAS, ARTERIAL - Abnormal; Notable for the following components:      Result Value   pH, Arterial 7.337 (*)    Acid-base deficit 3.4 (*)    All other components within normal limits  COMPREHENSIVE METABOLIC PANEL - Abnormal; Notable for the following components:   Sodium 126 (*)    Potassium 5.4 (*)    Chloride 92 (*)    CO2 19 (*)    Glucose, Bld 108 (*)    BUN 51 (*)    Total Protein 9.0 (*)    AST 47 (*)    ALT 49 (*)    All other components within normal limits  CBC WITH DIFFERENTIAL/PLATELET - Abnormal; Notable for the following components:   RBC 6.58 (*)    MCV 77.8 (*)    MCH 25.1 (*)    RDW 15.8 (*)    Abs Immature Granulocytes 0.08 (*)    All other components within normal limits  URINALYSIS, ROUTINE W REFLEX MICROSCOPIC - Abnormal; Notable for the following components:   APPearance TURBID (*)    Hgb urine dipstick SMALL (*)    Bilirubin Urine SMALL (*)    Ketones, ur 5 (*)    Protein, ur >=300 (*)    Leukocytes,Ua TRACE (*)    WBC, UA >50 (*)    Bacteria, UA MANY (*)    All other components within normal limits  GASTROINTESTINAL PANEL BY PCR, STOOL (REPLACES STOOL CULTURE)  C DIFFICILE QUICK SCREEN W PCR REFLEX  URINE CULTURE  LIPASE, BLOOD    EKG None  Radiology DG Skull 1-3 Views  Result Date: 01/12/2021 CLINICAL DATA:  Possible ventriculostomy shunt  malfunction. EXAM: SKULL - 1-3 VIEW COMPARISON:  Plain films of the skull 12/23/2015. FINDINGS: Right parietal approach ventriculostomy shunt catheter is intact. Fragments from a prior shunts in the soft tissues of the right neck are unchanged. No bony abnormality. IMPRESSION: Right ventriculostomy shunt catheter is intact. Fragments from prior shunts in the soft tissues of the right neck are unchanged. Electronically Signed   By: Inge Rise M.D.   On: 01/12/2021 15:04   DG Chest 1 View  Result Date: 01/12/2021 CLINICAL DATA:  Possible ventriculostomy shunt malfunction. EXAM: CHEST  1 VIEW COMPARISON:  Single-view of the chest 10/19/2020. FINDINGS: Ventriculostomy shunt catheter is intact. Remnant from a previous shunt in the soft tissues of the right chest is unchanged. Shunt fragments in the right neck are also unchanged. Lungs appear clear. Heart size is normal. Broken spinal stabilization hardware on the left is unchanged. IMPRESSION: Intact ventriculostomy shunt catheter. Electronically Signed   By: Inge Rise M.D.   On: 01/12/2021 15:05   DG Abd 1 View  Result Date: 01/12/2021 CLINICAL DATA:  Possible ventriculostomy shunt malfunction. EXAM: ABDOMEN - 1 VIEW COMPARISON:  Single-view of the abdomen 12/23/2015 FINDINGS: Ventriculostomy shunt catheter with its tip in the right upper quadrant is unchanged. Bowel gas pattern is nonobstructive. Spinal fusion hardware is in place. Broken stabilization rod on the left is unchanged. IMPRESSION: Intact ventriculostomy shunt.  No acute finding. Electronically Signed   By: Inge Rise M.D.   On: 01/12/2021 15:07  CT Head Wo Contrast  Result Date: 01/12/2021 CLINICAL DATA:  Nausea history of shunt, headache EXAM: CT HEAD WITHOUT CONTRAST TECHNIQUE: Contiguous axial images were obtained from the base of the skull through the vertex without intravenous contrast. COMPARISON:  Shunt series 01/12/2021, CT brain 10/19/2020, 12/23/2015 FINDINGS: Brain:  Similar positioning of right posterior shunt catheter which terminates at the frontal horn of left lateral ventricle. Stable ventricular size and configuration. No acute territorial infarction hemorrhage or mass is seen. Patchy white matter hypodensities as before. 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 Vascular: No hyperdense vessels.  No unexpected calcification Skull: Right posterior burr hole with shunt.  No fracture Sinuses/Orbits: No acute finding. Other: None IMPRESSION: 1. No CT evidence for acute intracranial abnormality. No interval ventricular enlargement 2. Stable positioning of right posterior shunt catheter. Stable ventricular size. Electronically Signed   By: Donavan Foil M.D.   On: 01/12/2021 15:29    Procedures Procedures   Medications Ordered in ED Medications  sodium chloride 0.9 % bolus 1,000 mL (has no administration in time range)  cefTRIAXone (ROCEPHIN) 1 g in sodium chloride 0.9 % 100 mL IVPB (has no administration in time range)  sodium chloride 0.9 % bolus 1,000 mL (0 mLs Intravenous Stopped 01/12/21 1521)    ED Course  I have reviewed the triage vital signs and the nursing notes.  Pertinent labs & imaging results that were available during my care of the patient were reviewed by me and considered in my medical decision making (see chart for details).    MDM Rules/Calculators/A&P                          Leodis Sias presented with intermittent obstruction of his suprapubic catheter, right-sided abdominal pain, nausea, and right-sided headache.  He has a complicated medical history secondary to his spina bifida.  He was evaluated for evidence of shunt complications given his headache, gastrointestinal infection, and/or urinary tract infection.  Shunt appears to be intact without evidence of acute complication.  Presentation is not compatible with meningitis, and I do not think he requires tap.  He does have  evidence of a urinary tract infection and was given IV antibiotics as well as a prescription for oral antibiotics.  Admission to the hospital was offered given his low sodium and elevated BUN.  However, he was hydrated here in the ED, and he and his mother both prefer to go home.  They do have an appointment for catheter change tomorrow, and he can be closely followed.  At this point, stool studies are pending.  At this point, he is waiting on a CT stone study.  He does have a history of kidney stones, and urinalysis suggestive of possible stones.  Given his right-sided abdominal pain, I think this is reasonable.  Dr. Darl Householder will follow the results and modify plan as needed.  Final Clinical Impression(s) / ED Diagnoses Final diagnoses:  Urinary tract infection associated with indwelling urethral catheter, initial encounter [facility])    Rx / DC Orders ED Discharge Orders         Ordered    cefpodoxime (VANTIN) 200 MG tablet  2 times daily        01/12/21 1605           Arnaldo Natal, MD 01/12/21 [phone]

## 2021-01-12 NOTE — ED Triage Notes (Signed)
Per EMS, pt complains of centralized abdominal pain, nausea, headache x 2 days. No vomiting or diarrhea. He has foley and ileostomy, no complications with those. Sensitive to light.   BP 125/76 HR 87 O2 97% CBG 105 Temp 96.7

## 2021-01-12 NOTE — ED Notes (Signed)
Pt has been able to hold fluids down. Family at bedside. Respirations even and unlabored

## 2021-01-12 NOTE — ED Provider Notes (Signed)
  Physical Exam  BP 135/87   Pulse 89   Temp 97.8 F (36.6 C)   Resp 18   Ht 4\' 10"  (1.473 m)   Wt 72.6 kg   SpO2 100%   BMI 33.44 kg/m   Physical Exam  ED Course/Procedures     Procedures  MDM  Care assumed at 3 PM.  Patient is paraplegic.  Patient has a suprapubic catheter and UA showed UTI.  Patient's suprapubic catheter is scheduled to be exchanged tomorrow urology office.  Patient does have mild hyponatremia sodium 126.  He does have some crystals in his urine so signout pending CT renal stone  6:49 PM CT did not show any stone.  Patient received Rocephin already.  Dr. Joya Gaskins prescribed antibiotics to go home with.  He has urology follow-up.  I also encouraged him to follow-up with PCP in a week for recheck BMP.    Drenda Freeze, MD 01/12/21 [phone]

## 2021-01-12 NOTE — ED Notes (Signed)
An After Visit Summary was printed and given to the patient. Discharge instructions given and no further questions at this time.  

## 2021-01-13 LAB — GASTROINTESTINAL PANEL BY PCR, STOOL (REPLACES STOOL CULTURE)

## 2021-01-15 LAB — URINE CULTURE: Culture: >=100000 — AB

## 2021-01-16 ENCOUNTER — Telehealth (HOSPITAL_BASED_OUTPATIENT_CLINIC_OR_DEPARTMENT_OTHER): Payer: Self-pay | Admitting: Emergency Medicine

## 2021-01-16 NOTE — Telephone Encounter (Signed)
Post ED Visit - Positive Culture Follow-up  Culture report reviewed by antimicrobial stewardship pharmacist: [place] Team []  Elenor Quinones, Pharm.D. []  Heide Guile, Pharm.D., BCPS AQ-ID []  Parks Neptune, Pharm.D., BCPS []  Alycia Rossetti, Pharm.D., BCPS []  [place], Pharm.D., BCPS, AAHIVP []  Legrand Como, Pharm.D., BCPS, AAHIVP []  Salome Arnt, PharmD, BCPS []  Johnnette Gourd, PharmD, BCPS []  Hughes Better, PharmD, BCPS []  Leeroy Cha, PharmD []  Laqueta Linden, PharmD, BCPS []  Albertina Parr, PharmD  [place] Team []  Leodis Sias, PharmD []  Lindell Spar, PharmD []  Royetta Asal, PharmD [x]  Graylin Shiver, Rph []  Rema Fendt) Glennon Mac, PharmD []  Arlyn Dunning, PharmD []  Netta Cedars, PharmD []  Dia Sitter, PharmD []  Leone Haven, PharmD []  Gretta Arab, PharmD []  Theodis Shove, PharmD []  Peggyann Juba, PharmD []  Reuel Boom, PharmD   Positive urine culture Treated with Cefpodoxime, organism sensitive to the same and no further patient follow-up is required at this time.  [place] 01/16/2021, 10:00 AM

## 2021-02-02 ENCOUNTER — Other Ambulatory Visit: Payer: Self-pay

## 2021-02-02 ENCOUNTER — Ambulatory Visit: Payer: Medicare Other | Attending: Family Medicine | Admitting: Physical Therapy

## 2021-02-02 DIAGNOSIS — R29898 Other symptoms and signs involving the musculoskeletal system: Secondary | ICD-10-CM | POA: Diagnosis present

## 2021-02-02 DIAGNOSIS — M6281 Muscle weakness (generalized): Secondary | ICD-10-CM | POA: Insufficient documentation

## 2021-02-02 DIAGNOSIS — R2689 Other abnormalities of gait and mobility: Secondary | ICD-10-CM | POA: Diagnosis not present

## 2021-02-03 ENCOUNTER — Encounter: Payer: Self-pay | Admitting: Physical Therapy

## 2021-02-03 NOTE — Therapy (Signed)
[facility] [address] [place], [place], [address] Phone: [phone]   Fax:  [phone]  Physical Therapy Evaluation  Patient Details  Name: [name] MRN: [id number] Date of Birth: [birth year] Referring Provider (PT): Novella Rob, FNP   Encounter Date: 02/02/2021   PT End of Session - 02/03/21 1931    Visit Number 1    Authorization Type [facility] Medicare/Medicaid    Authorization Time Period 02-02-21 - 03-05-21    PT Start Time 1050    PT Stop Time 1205    PT Time Calculation (min) 75 min    Activity Tolerance Patient tolerated treatment well    Behavior During Therapy [facility] for tasks assessed/performed           Past Medical History:  Diagnosis Date  . Arnold-Chiari malformation ([place])   . 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 GERD (gastroesophageal reflux disease)   . Heart palpitations    SECONDARY TO VP SHUNT DRAIN  . History of seizure    per pt mother, no seizures since age 52  . Hypertension   . Ileostomy in place [facility]) 2014  . Migraines followed by neurologist @[facility]    taking verapamil  . Neurogenic bladder    INCONTINENT -  . Neurogenic bowel   . S/P VP shunt   . Spina bifida with hydrocephalus, lumbar region ([place])    CONGENITAL--   VP SHUNT in place--  PARALYZIED MID ABDOMEN DOWN  . Urinary incontinence with continuous leakage   . Wears glasses   . Wheelchair bound    CAN TRASFER SELF WITHOUT BOARD    Past Surgical History:  Procedure Laterality Date  . BACK SURGERY  infant   "get skin to stretch over his back; spinal bifida"  . BACK SURGERY  age 42   scoliosis and  removed hump on his back (kyphosis) and put rods in to stabalize back"  . BOTOX INJECTION N/A 11/14/2018   Procedure: BOTOX INJECTION CYSTOSCOPY;  Surgeon: Lucas Mallow, MD;  Location: [facility];  Service: Urology;  Laterality: N/A;  . BOTOX INJECTION N/A 05/22/2019   Procedure: BOTOX INJECTION;   Surgeon: Lucas Mallow, MD;  Location: [facility];  Service: Urology;  Laterality: N/A;  . BOTOX INJECTION N/A 11/13/2019   Procedure: CYSTOSCOPY BOTOX INJECTION;  Surgeon: Lucas Mallow, MD;  Location: [facility];  Service: Urology;  Laterality: N/A;  . BOTOX INJECTION N/A 07/01/2020   Procedure: CYSTOSCOPY, BOTOX INJECTION, BLADDER FULGERATION, SUPRAPUBIC TUBE EXCHANGE;  Surgeon: Ceasar Mons, MD;  Location: [facility];  Service: Urology;  Laterality: N/A;  . BOTOX INJECTION N/A 12/14/2020   Procedure: BOTOX INJECTION 300 UNITS SUPRAPUBIC TUBE CHANGE;  Surgeon: Lucas Mallow, MD;  [facility]: [facility];  Service: Urology;  Laterality: N/A;  . CHOLECYSTECTOMY N/A 07/06/2013   Procedure: LAPAROSCOPIC CHOLECYSTECTOMY WITH INTRAOPERATIVE CHOLANGIOGRAM;  Surgeon: Madilyn Hook, DO;  Location: [facility];  Service: General;  Laterality: N/A;  . COLON RESECTION N/A 07/08/2013   Procedure: EXPLORATORY LAPAROSCOPY, DIAGNOSTIC LAPAROSCOPY, PARTIAL COLECTOMY, ABDOMINAL WASHOUT, ABDOMINAL WOUND VAC;  Surgeon: Madilyn Hook, DO;  Location: [facility];  Service: General;  Laterality: N/A;  . HIP SURGERY Bilateral AS CHILD   TENDON RELEASE  . HOLMIUM LASER APPLICATION N/A 56/43/3295   Procedure: HOLMIUM LASER OF BLADDER STONE;  Surgeon: Ceasar Mons, MD;  Location: [facility];  Service: Urology;  Laterality: N/A;  . INCISION AND DRAINAGE  OF WOUND N/A 11/04/2013   Procedure: IRRIGATION AND DEBRIDEMENT ABDOMINAL WOUND WITH PLACEMENT OF ACELL/VAC;  Surgeon: Theodoro Kos, DO;  Location: [facility];  Service: Plastics;  Laterality: N/A;  . INSERTION OF SUPRAPUBIC CATHETER N/A 12/23/2019   Procedure: FLEXIBLE CYSTOSCOPE WITH FOLEY PLACEMENT OVER WIRE.;  Surgeon: Robley Fries, MD;  [facility];  Service: Urology;  Laterality: N/A;  . IR CATHETER TUBE CHANGE  02/15/2019  . IR CATHETER TUBE CHANGE  03/01/2019  . IR CATHETER TUBE CHANGE  05/06/2019  . IR CATHETER TUBE CHANGE  03/06/2020  .  IR CATHETER TUBE CHANGE  04/16/2020  . IR CATHETER TUBE CHANGE  04/29/2020  . 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 RIGHT COLECTOMY/  APPENDECTOMY/  ILEOSTOMY  NOV 2014  [facility]IST   surgical complication with cholecystectomy  . UMBILICAL HERNIA REPAIR  child  . VENTRICULOPERITONEAL SHUNT  11-12-2018 per pt and mother---LAST REVISION ~ 06/2015   DUE TO ABD WOUND--- SHUNT DRAINS TO HEART (12/23/2015)    There were no vitals filed for this visit.    Subjective Assessment - 02/03/21 1925    Subjective Pt presents for power wheelchair eval with vendor Deberah Pelton, [employer] with [employer] present ; pt needs w/c with power tilt and recline    Patient is accompained by: Family member   mother   Pertinent History thoracic spina bifida with hydrocephalus, h/o stage 3 decubitus 2017, ileostomy 2014, Arnold-Chiari malformation    Limitations Other (comment)    Patient Stated Goals obtain new power wheelchair    Currently in Pain? No/denies              [facility] PT Assessment - 02/03/21 0001      Assessment   Medical Diagnosis Thoracic Spina Bifida with hydrocephalus    Referring Provider (PT) Novella Rob, FNP    Onset Date/Surgical Date --   Congenital     Precautions   Precautions Fall;Other (comment)   nonambulatory due to paraplegia     Balance Screen   Has the patient fallen in the past 6 months No    Has the patient had a decrease in activity level because of a fear of falling?  No    Is the patient reluctant to leave their home because of a fear of falling?  No      Prior Function   Level of Independence Needs assistance with ADLs;Needs assistance with transfers;Other (comment)   requires w/c for mobility                     Objective measurements completed on examination: See above findings.             LMN for power wheelchair with power tilt and recline to be completed when specs received from vendor  Deberah Pelton, [employer] with [employer] present for  eval - Permobil M3 recommended                Plan - 02/03/21 1932    Clinical Impression Statement Pt evaluated for power wheelchair -vendor Deberah Pelton, [employer] with [employer] present for eval; recommend Permobil M3 with power tilt and recline; see LMN for details.    Personal Factors and Comorbidities Comorbidity 2;Time since onset of injury/illness/exacerbation;Transportation;Past/Current Experience;Fitness    Examination-Activity Limitations Bed Mobility;Bathing;Hygiene/Grooming;Locomotion Level;Stand;Toileting;Transfers    Examination-Participation Restrictions Cleaning;Meal Prep;Community Activity;Interpersonal Relationship;Laundry;Shop    Stability/Clinical Decision Making Evolving/Moderate complexity    Clinical Decision Making Moderate    Rehab Potential  Good    PT Frequency One time visit    PT Next Visit Plan N/A - w/c eval only    Consulted and Agree with Plan of Care Patient;Family member/caregiver    Family Member Consulted mother           Patient will benefit from skilled therapeutic intervention in order to improve the following deficits and impairments:  Hypomobility,Decreased balance,Decreased mobility,Decreased strength,Decreased range of motion,Postural dysfunction  Visit Diagnosis: Other abnormalities of gait and mobility - Plan: PT plan of care cert/re-cert  Muscle weakness (generalized) - Plan: PT plan of care cert/re-cert  Other symptoms and signs involving the musculoskeletal system - Plan: PT plan of care cert/re-cert     Problem List Patient Active Problem List   Diagnosis Date Noted  . Essential hypertension 12/23/2015  . Hypotension 12/23/2015  . Decubitus ulcer of buttock, stage 3 ([place]) 11/20/2015  . Complicated UTI (urinary tract infection)   . Sepsis secondary to UTI ([place]) 11/19/2015  . Sepsis ([place]) 07/22/2015  . AKI (acute kidney injury) ([place]) 07/22/2015  . Hyperkalemia 07/22/2015  . Hyponatremia 07/22/2015  . Thrombocytosis  07/22/2015  . Leukocytosis 07/22/2015  . Borderline diabetes 07/22/2015  . Arnold-Chiari malformation ([place]) 07/22/2015  . Urinary tract infectious disease   . Headache 11/06/2014  . H/O ventricular shunt   . 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 Severe sepsis ([place]) 10/26/2014  . Renal failure (ARF), acute on chronic (HCC) 10/26/2014  . Severe sepsis with septic shock ([place]) 10/26/2014  . UTI (lower urinary tract infection)   . Anemia in chronic renal disease 09/30/2013  . Esophageal reflux 09/17/2013  . Protein-calorie malnutrition, severe ([place]) 09/17/2013  . Essential hypertension, benign 09/17/2013  . Anemia of other chronic disease 09/17/2013  . Septic shock ([place]) 07/09/2013  . Spina bifida ([place]) 07/07/2013  . Neurogenic bladder 07/07/2013  . Elevated LFTs 07/07/2013    Dild[place]ness [place], PT 02/03/2021, 7:44 PM  [facility] [address] [place], [place], [address] Phone: [phone]   Fax:  [phone]  Name: [name] MRN: [id number] Date of Birth: [birth year]

## 2021-06-30 IMAGING — XA IR CATHETER TUBE CHANGE
3 series · 13 of 13 positions shown · non-contrast
Comparison: Image guided suprapubic catheter exchange-03/01/2019

INDICATION: History of neurogenic bladder post image guided placement of a
suprapubic catheter on 12/21/2018.

[Series 3: fl - angio · 4 of 26 frames shown (1 of 2)]
[frame 4/26]
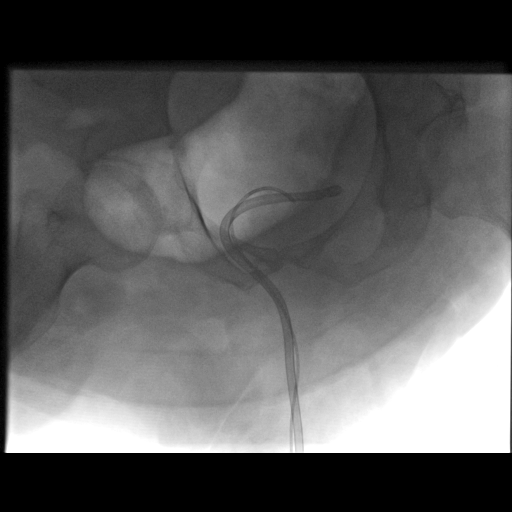
[frame 12/26]
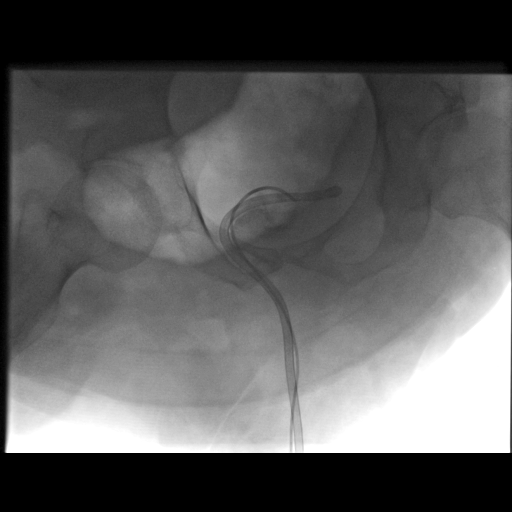
[frame 14/26]
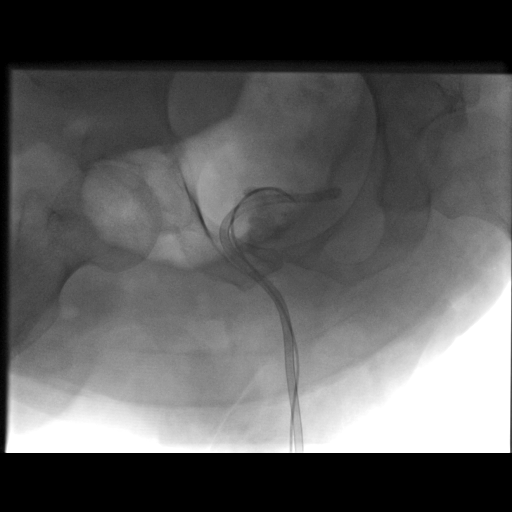
[frame 23/26]
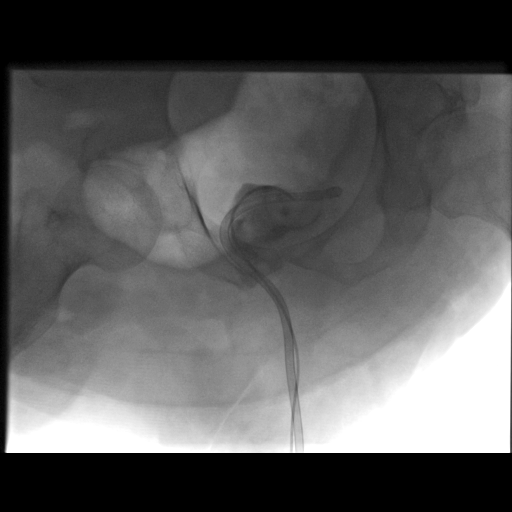

[Series 4: fl - angio · 4 of 37 frames shown (2 of 2)]
[frame 6/37]
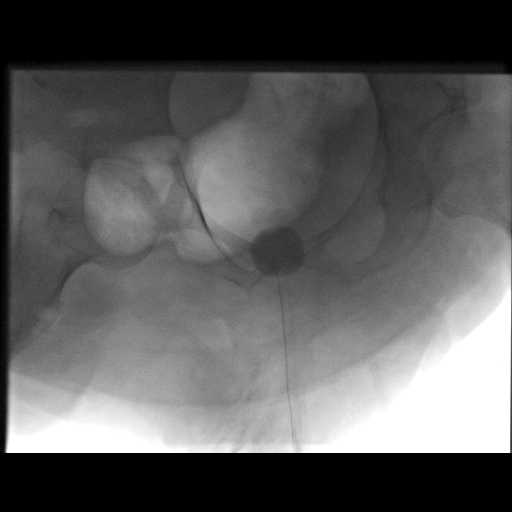
[frame 7/37]
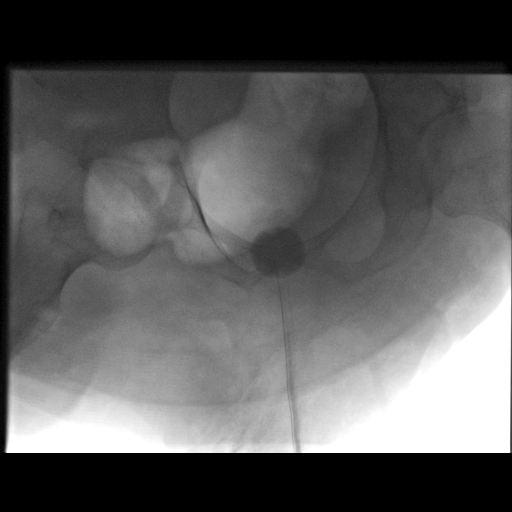
[frame 19/37]
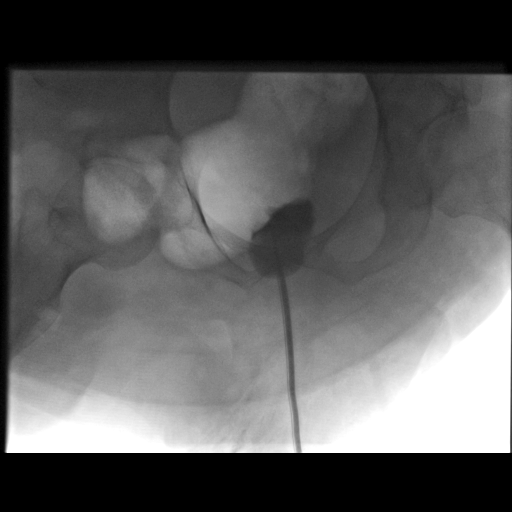
[frame 32/37]
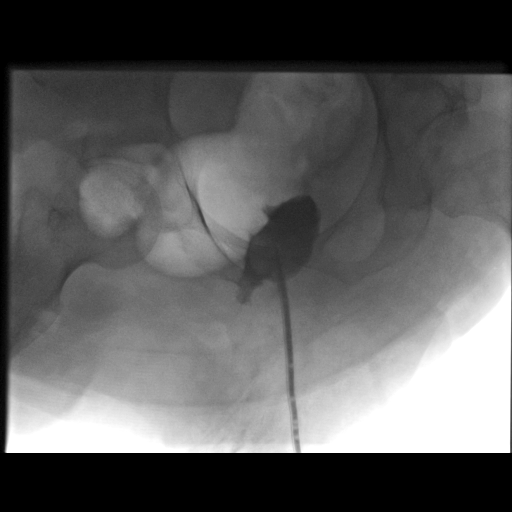

[Series 300: tube placements · 5 of 5 slices shown]
[im 1/5]
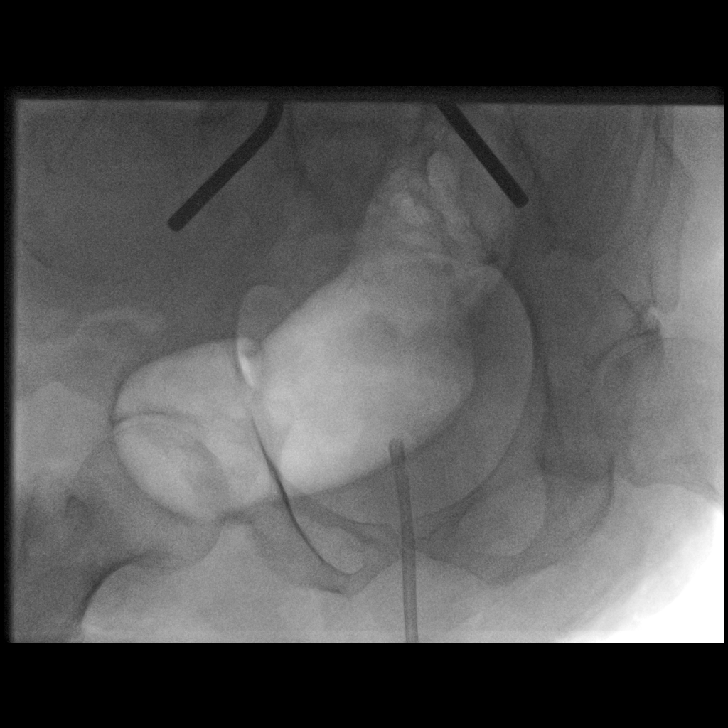
[im 2/5]
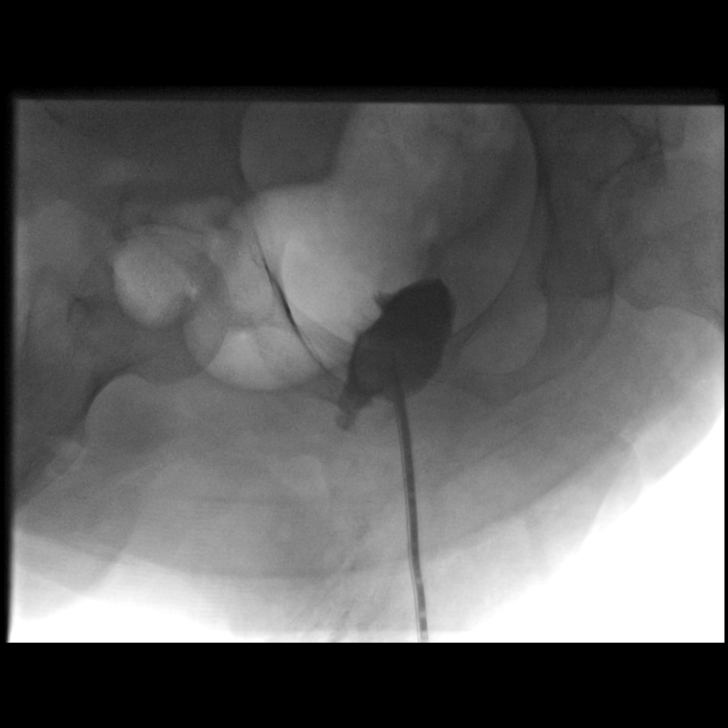
[im 3/5]
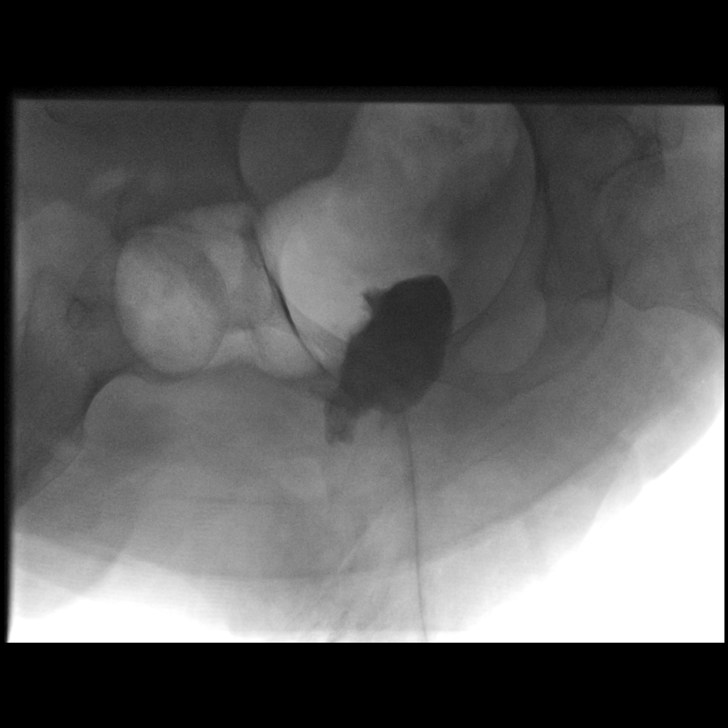
[im 4/5]
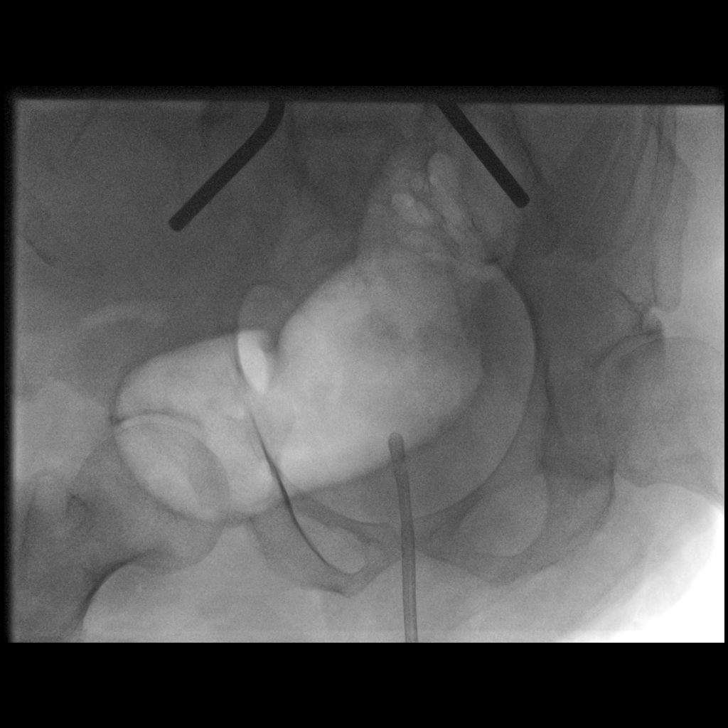
[im 5/5]
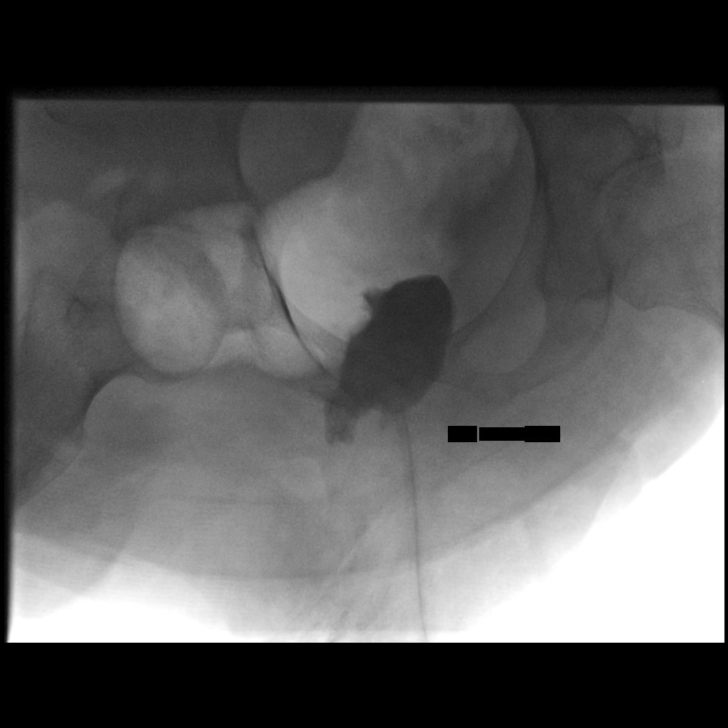

[13 of 13 positions shown; findings below may reference images not displayed]

The patient's suprapubic catheter was most recently exchanged and
upsized by the interventional [HOSPITAL] on 03/01/2019
with placement of a 16 French balloon retention suprapubic catheter.

This catheter was subsequently exchanged and converted by Dr. Find
(Urology) for Bouftou Foufa catheter, however this catheter has now
become clogged.

As such, patient now presents for fluoroscopic guided exchange

EXAM:
FLUOROSCOPIC GUIDED SUPRAPUBIC CATHETER EXCHANGE
MEDICATIONS:
None

ANESTHESIA/SEDATION:
None

CONTRAST:  20 cc Omnipaque 300-administered into the urinary bladder

FLUOROSCOPY TIME:  1 minutes, 54 seconds (20 mGy)

COMPLICATIONS:
None immediate.

PROCEDURE:
The procedure, risks, benefits, and alternatives were explained to
the patient. Questions regarding the procedure were encouraged and
answered. The patient understands and consents to the procedure. A
timeout was performed prior to the initiation of the procedure.

The external portion of the existing suprapubic catheter as well as
the surrounding skin were prepped and draped in usual sterile
fashion

A preprocedural spot fluoroscopic image was obtained of the lower
pelvis and existing Martunas Zandoviene suprapubic catheter.

The catheter cannot be on-call of with cannulation of a short
Amplatz wire.

As such, a short Kumpe catheter was advanced alongside the track the
Martunas Zandoviene catheter to the level of the urinary bladder. Contrast
injection confirmed appropriate positioning.

Next, the external portion of the Martunas Zandoviene catheter was cut and the
catheter was removed intact.

Under intermittent fluoroscopic guidance, the Kumpe catheter was
exchanged for a 18 French balloon retention suprapubic catheter.
Balloon was inflated with approximately 10 cc of saline and dilute
contrast and pulled taut against the ventral aspect of the urinary
bladder wall. The external disc was cinched. Contrast injection
confirmed appropriate positioning and functionality.

The suprapubic catheter was connected to a Foley bag.

A dressing was placed. The patient tolerated the procedure well
without immediate postprocedural complication.
FINDINGS: The existing Martunas Zandoviene suprapubic catheter is completely occluded,
refractory to attempted decloging with an Amplatz wire.

After fluoroscopic guided exchange and conversion, a new 18 French
balloon retention suprapubic catheter is appropriately positioned
with balloon within the decompressed urinary bladder.
IMPRESSION: Technically successful fluoroscopic guided exchange and conversion
back to an 18 French balloon retention suprapubic catheter.

## 2021-07-09 ENCOUNTER — Other Ambulatory Visit: Payer: Self-pay | Admitting: Urology

## 2021-07-09 NOTE — Patient Instructions (Addendum)
DUE TO COVID-19 ONLY ONE VISITOR IS ALLOWED TO COME WITH YOU AND STAY IN THE WAITING ROOM ONLY DURING PRE OP AND PROCEDURE.   **NO VISITORS ARE ALLOWED IN THE SHORT STAY AREA OR RECOVERY ROOM!!**        Your procedure is scheduled on:  Monday, 07-26-21   Report to [facility] Main  Entrance     Report to admitting at 6:45 AM   Call this number if you have problems the morning of surgery [phone]   Do not eat food :After Midnight.   May have liquids until 6:00 AM  day of surgery  CLEAR LIQUID DIET  Foods Allowed                                                                     Foods Excluded  Water, Black Coffee (no milk/no creamer) and tea, regular and decaf                              liquids that you cannot  Plain Jell-O in any flavor  (No red)                         see through such as: Fruit ices (not with fruit pulp)                                 milk, soups, orange juice  Iced Popsicles (No red)                                    All solid food                             Apple juices Sports drinks like Gatorade (No red) Lightly seasoned clear broth or consume(fat free) Sugar     Oral Hygiene is also important to reduce your risk of infection.                                    Remember - BRUSH YOUR TEETH THE MORNING OF SURGERY WITH YOUR REGULAR TOOTHPASTE   Do NOT smoke after Midnight  Take these medicines the morning of surgery with A SIP OF WATER:  Acetaminophen, Metoprolol, Oxybutynin, Rosuvastatin.  Ondansetron if needed   Stop all vitamins and herbal supplements a week before surgery             You may not have any metal on your body including  jewelry, and body piercing             Do not wear lotions, powders, cologne, or deodorant              Men may shave face and neck.  Do not bring valuables to the hospital. [place].   Contacts, dentures or bridgework may not be worn into  surgery.    Patients discharged the day  of surgery will not be allowed to drive home.  Please read over the following fact sheets you were given: IF YOU HAVE QUESTIONS ABOUT YOUR PRE OP INSTRUCTIONS PLEASE CALL [facility] - Preparing for Surgery Before surgery, you can play an important role.  Because skin is not sterile, your skin needs to be as free of germs as possible.  You can reduce the number of germs on your skin by washing with CHG (chlorahexidine gluconate) soap before surgery.  CHG is an antiseptic cleaner which kills germs and bonds with the skin to continue killing germs even after washing. Please DO NOT use if you have an allergy to CHG or antibacterial soaps.  If your skin becomes reddened/irritated stop using the CHG and inform your nurse when you arrive at Short Stay. Do not shave (including legs and underarms) for at least 48 hours prior to the first CHG shower.  You may shave your face/neck.  Please follow these instructions carefully:  1.  Shower with CHG Soap the night before surgery and the  morning of surgery.  2.  If you choose to wash your hair, wash your hair first as usual with your normal  shampoo.  3.  After you shampoo, rinse your hair and body thoroughly to remove the shampoo.                             4.  Use CHG as you would any other liquid soap.  You can apply chg directly to the skin and wash.  Gently with a scrungie or clean washcloth.  5.  Apply the CHG Soap to your body ONLY FROM THE NECK DOWN.   Do   not use on face/ open                           Wound or open sores. Avoid contact with eyes, ears mouth and   genitals (private parts).                       Wash face,  Genitals (private parts) with your normal soap.             6.  Wash thoroughly, paying special attention to the area where your    surgery  will be performed.  7.  Thoroughly rinse your body with warm water from the neck down.  8.  DO NOT shower/wash with your normal  soap after using and rinsing off the CHG Soap.                9.  Pat yourself dry with a clean towel.            10.  Wear clean pajamas.            11.  Place clean sheets on your bed the night of your first shower and do not  sleep with pets. Day of Surgery : Do not apply any lotions/deodorants the morning of surgery.  Please wear clean clothes to the hospital/surgery center.  FAILURE TO FOLLOW THESE INSTRUCTIONS MAY RESULT IN THE CANCELLATION OF YOUR SURGERY  PATIENT SIGNATURE_________________________________  NURSE SIGNATURE__________________________________  ________________________________________________________________________

## 2021-07-09 NOTE — Progress Notes (Addendum)
COVID swab appointment: N/A  COVID Vaccine Completed:  Yes x2 Date COVID Vaccine completed:  04-22-20 07-10-20 Has received booster: COVID vaccine manufacturer: Moderna     Date of COVID positive in last 90 days:  No  PCP - Dr. Darlina Sicilian Cardiologist - N/A  Chest x-ray - 01-12-21 Epic EKG - 10-19-20 Epic Stress Test -  N/A ECHO - greater than 2 years, [facility] Cardiac Cath -  N/A Pacemaker/ICD device last checked: Spinal Cord Stimulator:  Sleep Study -  N/A CPAP -   Fasting Blood Sugar -  N/A Checks Blood Sugar _____ times a day  Blood Thinner Instructions: N/A Aspirin Instructions: Last Dose:  Activity level:  Patient is wheelchair bound and needs assistance with ADLs   Anesthesia review:  Paraplegia due to spina bifida, HTN  Patient denies shortness of breath, fever, cough and chest pain at PAT appointment   Patient verbalized understanding of instructions that were given to them at the PAT appointment. Patient was also instructed that they will need to review over the PAT instructions again at home before surgery.

## 2021-07-13 ENCOUNTER — Encounter (HOSPITAL_COMMUNITY)
Admission: RE | Admit: 2021-07-13 | Discharge: 2021-07-13 | Disposition: A | Discharge status: Home / Self Care | Payer: Medicare Other | Source: Ambulatory Visit | Attending: Urology | Admitting: Urology

## 2021-07-13 ENCOUNTER — Encounter (HOSPITAL_COMMUNITY): Payer: Self-pay

## 2021-07-13 ENCOUNTER — Other Ambulatory Visit: Payer: Self-pay

## 2021-07-13 VITALS — BP 134/88 | HR 80 | Temp 98.4°F | Ht 60.0 in | Wt 170.0 lb

## 2021-07-13 DIAGNOSIS — D631 Anemia in chronic kidney disease: Secondary | ICD-10-CM | POA: Insufficient documentation

## 2021-07-13 DIAGNOSIS — I10 Essential (primary) hypertension: Secondary | ICD-10-CM | POA: Diagnosis not present

## 2021-07-13 DIAGNOSIS — Z01812 Encounter for preprocedural laboratory examination: Secondary | ICD-10-CM | POA: Insufficient documentation

## 2021-07-13 DIAGNOSIS — R7303 Prediabetes: Secondary | ICD-10-CM | POA: Diagnosis not present

## 2021-07-13 DIAGNOSIS — N189 Chronic kidney disease, unspecified: Secondary | ICD-10-CM | POA: Diagnosis not present

## 2021-07-13 LAB — BASIC METABOLIC PANEL
Anion gap: 7 (ref 5–15)
BUN: 11 mg/dL (ref 6–20)
CO2: 27 mmol/L (ref 22–32)
Calcium: 9 mg/dL (ref 8.9–10.3)
Chloride: 100 mmol/L (ref 98–111)
Creatinine, Ser: 0.47 mg/dL — ABNORMAL LOW (ref 0.61–1.24)
GFR, Estimated: >60 mL/min (ref >60)
Glucose, Bld: 121 mg/dL — ABNORMAL HIGH (ref 70–99)
Potassium: 4.3 mmol/L (ref 3.5–5.1)
Sodium: 134 mmol/L — ABNORMAL LOW (ref 135–145)

## 2021-07-13 LAB — CBC
HCT: 49.6 % (ref 39.0–52.0)
Hemoglobin: 15.4 g/dL (ref 13.0–17.0)
MCH: 25.1 pg — ABNORMAL LOW (ref 26.0–34.0)
MCHC: 31 g/dL (ref 30.0–36.0)
MCV: 80.9 fL (ref 80.0–100.0)
Platelets: 251 10*3/uL (ref 150–400)
RBC: 6.13 MIL/uL — ABNORMAL HIGH (ref 4.22–5.81)
RDW: 14.5 % (ref 11.5–15.5)
WBC: 7.4 10*3/uL (ref 4.0–10.5)
nRBC: 0 % (ref 0.0–0.2)

## 2021-07-13 LAB — GLUCOSE, CAPILLARY: Glucose-Capillary: 147 mg/dL — ABNORMAL HIGH (ref 70–99)

## 2021-07-13 LAB — HEMOGLOBIN A1C
Hgb A1c MFr Bld: 5.5 % (ref 4.8–5.6)
Mean Plasma Glucose: 111.15 mg/dL

## 2021-07-24 NOTE — Anesthesia Preprocedure Evaluation (Addendum)
Anesthesia Evaluation  Patient identified by MRN, date of birth, ID band Patient awake    Reviewed: Allergy & Precautions, NPO status , Patient's Chart, lab work & pertinent test results, reviewed documented beta blocker date and time   History of Anesthesia Complications Negative for: history of anesthetic complications  Airway Mallampati: III  TM Distance: >3 FB Neck ROM: Full    Dental no notable dental hx. (+) Poor Dentition, Chipped, Missing, Dental Advisory Given   Pulmonary neg pulmonary ROS,    Pulmonary exam normal breath sounds clear to auscultation       Cardiovascular hypertension, Pt. on home beta blockers and Pt. on medications Normal cardiovascular exam Rhythm:Regular Rate:Normal     Neuro/Psych  Headaches, Seizures - (none since age 40), Well Controlled,   Spina bifida Paraplegic   negative psych ROS   GI/Hepatic Neg liver ROS, GERD  Controlled,  Endo/Other  negative endocrine ROS  Renal/GU Renal disease    Neurogenic bladder     Musculoskeletal negative musculoskeletal ROS (+)   Abdominal   Peds  Hematology  (+) Blood dyscrasia, anemia ,   Anesthesia Other Findings Hx prolonged paralysis following succinylcholine   Reproductive/Obstetrics                            Anesthesia Physical  Anesthesia Plan  ASA: 3  Anesthesia Plan: MAC   Post-op Pain Management:    Induction: Intravenous  PONV Risk Score and Plan: 1 and Propofol infusion and Treatment may vary due to age or medical condition  Airway Management Planned: Natural Airway, Simple Face Mask and Nasal Cannula  Additional Equipment: None  Intra-op Plan:   Post-operative Plan:   Informed Consent: I have reviewed the patients History and Physical, chart, labs and discussed the procedure including the risks, benefits and alternatives for the proposed anesthesia with the patient or authorized  representative who has indicated his/her understanding and acceptance.       Plan Discussed with: CRNA and Anesthesiologist  Anesthesia Plan Comments:         Anesthesia Quick Evaluation

## 2021-07-26 ENCOUNTER — Ambulatory Visit (HOSPITAL_COMMUNITY)
Admission: RE | Admit: 2021-07-26 | Discharge: 2021-07-26 | Disposition: A | Discharge status: Home / Self Care | Payer: Medicare Other | Source: Ambulatory Visit | Attending: Urology | Admitting: Urology

## 2021-07-26 ENCOUNTER — Encounter (HOSPITAL_COMMUNITY)
Admission: RE | Disposition: A | Discharge status: Home / Self Care | Payer: Self-pay | Source: Ambulatory Visit | Attending: Urology

## 2021-07-26 ENCOUNTER — Ambulatory Visit (HOSPITAL_COMMUNITY): Payer: Medicare Other | Admitting: Physician Assistant

## 2021-07-26 ENCOUNTER — Encounter (HOSPITAL_COMMUNITY): Payer: Self-pay | Admitting: Urology

## 2021-07-26 ENCOUNTER — Ambulatory Visit (HOSPITAL_COMMUNITY): Payer: Medicare Other | Admitting: Anesthesiology

## 2021-07-26 DIAGNOSIS — N319 Neuromuscular dysfunction of bladder, unspecified: Secondary | ICD-10-CM | POA: Insufficient documentation

## 2021-07-26 DIAGNOSIS — R7303 Prediabetes: Secondary | ICD-10-CM | POA: Insufficient documentation

## 2021-07-26 DIAGNOSIS — D631 Anemia in chronic kidney disease: Secondary | ICD-10-CM

## 2021-07-26 DIAGNOSIS — N189 Chronic kidney disease, unspecified: Secondary | ICD-10-CM

## 2021-07-26 DIAGNOSIS — I10 Essential (primary) hypertension: Secondary | ICD-10-CM

## 2021-07-26 HISTORY — PX: INSERTION OF SUPRAPUBIC CATHETER: SHX5870

## 2021-07-26 LAB — GLUCOSE, CAPILLARY: Glucose-Capillary: 91 mg/dL (ref 70–99)

## 2021-07-26 SURGERY — INSERTION, SUPRAPUBIC CATHETER
Anesthesia: Monitor Anesthesia Care

## 2021-07-26 MED ORDER — ORAL CARE MOUTH RINSE
15.0000 mL | Freq: Once | OROMUCOSAL | Status: AC
  Administered 2021-07-26: 15 mL via OROMUCOSAL

## 2021-07-26 MED ORDER — OXYCODONE HCL 5 MG PO TABS: 5.0000 mg | ORAL_TABLET | Freq: Once | ORAL | Status: DC | PRN

## 2021-07-26 MED ORDER — ONDANSETRON HCL 4 MG/2ML IJ SOLN: 4.0000 mg | Freq: Once | INTRAMUSCULAR | Status: DC | PRN

## 2021-07-26 MED ORDER — LIDOCAINE HCL (PF) 2 % IJ SOLN
INTRAMUSCULAR | Status: AC
  Filled 2021-07-26: qty 5

## 2021-07-26 MED ORDER — FENTANYL CITRATE (PF) 100 MCG/2ML IJ SOLN
INTRAMUSCULAR | Status: AC
  Filled 2021-07-26: qty 2

## 2021-07-26 MED ORDER — OXYCODONE HCL 5 MG/5ML PO SOLN: 5.0000 mg | Freq: Once | ORAL | Status: DC | PRN

## 2021-07-26 MED ORDER — STERILE WATER FOR INJECTION IJ SOLN
INTRAMUSCULAR | Status: AC
  Filled 2021-07-26: qty 20

## 2021-07-26 MED ORDER — STERILE WATER FOR INJECTION IJ SOLN
INTRAMUSCULAR | Status: AC
  Filled 2021-07-26: qty 10

## 2021-07-26 MED ORDER — STERILE WATER FOR INJECTION IJ SOLN
INTRAMUSCULAR | Status: DC | PRN
  Administered 2021-07-26: 30 mL

## 2021-07-26 MED ORDER — ACETAMINOPHEN 325 MG PO TABS: 325.0000 mg | ORAL_TABLET | ORAL | Status: DC | PRN

## 2021-07-26 MED ORDER — FENTANYL CITRATE (PF) 100 MCG/2ML IJ SOLN
INTRAMUSCULAR | Status: DC | PRN
  Administered 2021-07-26 (×2): 50 ug via INTRAVENOUS

## 2021-07-26 MED ORDER — ONABOTULINUMTOXINA 100 UNITS IJ SOLR
INTRAMUSCULAR | Status: AC
  Filled 2021-07-26: qty 200

## 2021-07-26 MED ORDER — FENTANYL CITRATE PF 50 MCG/ML IJ SOSY: 25.0000 ug | PREFILLED_SYRINGE | INTRAMUSCULAR | Status: DC | PRN

## 2021-07-26 MED ORDER — PROPOFOL 500 MG/50ML IV EMUL
INTRAVENOUS | Status: DC | PRN
  Administered 2021-07-26: 50 ug/kg/min via INTRAVENOUS

## 2021-07-26 MED ORDER — ONABOTULINUMTOXINA 100 UNITS IJ SOLR
200.0000 [IU] | Freq: Once | INTRAMUSCULAR | Status: AC
  Administered 2021-07-26: 300 [IU] via INTRAMUSCULAR

## 2021-07-26 MED ORDER — CHLORHEXIDINE GLUCONATE 0.12 % MT SOLN: 15.0000 mL | Freq: Once | OROMUCOSAL | Status: AC

## 2021-07-26 MED ORDER — SODIUM CHLORIDE 0.9 % IV SOLN
1.0000 g | Freq: Once | INTRAVENOUS | Status: AC
  Administered 2021-07-26: 1 g via INTRAVENOUS
  Filled 2021-07-26: qty 1

## 2021-07-26 MED ORDER — MEPERIDINE HCL 50 MG/ML IJ SOLN: 6.2500 mg | INTRAMUSCULAR | Status: DC | PRN

## 2021-07-26 MED ORDER — ACETAMINOPHEN 160 MG/5ML PO SOLN: 325.0000 mg | ORAL | Status: DC | PRN

## 2021-07-26 MED ORDER — LACTATED RINGERS IV SOLN: INTRAVENOUS | Status: DC

## 2021-07-26 SURGICAL SUPPLY — 26 items
BAG COUNTER SPONGE SURGICOUNT (BAG) IMPLANT
BAG DRN RND TRDRP ANRFLXCHMBR (UROLOGICAL SUPPLIES)
BAG SPNG CNTER NS LX DISP (BAG)
BAG URINE DRAIN 2000ML AR STRL (UROLOGICAL SUPPLIES) ×1 IMPLANT
BAG URINE LEG 500ML (DRAIN) ×2 IMPLANT
BLADE SURG 15 STRL LF DISP TIS (BLADE) ×1 IMPLANT
BLADE SURG 15 STRL SS (BLADE)
CATH FOLEY LATEX FREE 20FR (CATHETERS)
CATH FOLEY LATEX FREE 22FR (CATHETERS) ×2
CATH FOLEY LF 20FR (CATHETERS) IMPLANT
CATH FOLEY LF 22FR (CATHETERS) IMPLANT
CATH URETL OPEN 5X70 (CATHETERS) IMPLANT
ELECT REM PT RETURN 15FT ADLT (MISCELLANEOUS) ×1 IMPLANT
GLOVE SURG POLYISO LF SZ8 (GLOVE) ×1 IMPLANT
GOWN STRL REUS W/TWL XL LVL3 (GOWN DISPOSABLE) ×4 IMPLANT
KIT TURNOVER KIT A (KITS) IMPLANT
MANIFOLD NEPTUNE II (INSTRUMENTS) ×2 IMPLANT
NDL ASPIRATION 22 (NEEDLE) IMPLANT
NEEDLE ASPIRATION 22 (NEEDLE) ×2 IMPLANT
NEEDLE HYPO 22GX1.5 SAFETY (NEEDLE) ×2 IMPLANT
PACK CYSTO (CUSTOM PROCEDURE TRAY) ×2 IMPLANT
PENCIL SMOKE EVACUATOR (MISCELLANEOUS) IMPLANT
PLUG CATH AND CAP STER (CATHETERS) ×1 IMPLANT
SPONGE DRAIN TRACH 4X4 STRL 2S (GAUZE/BANDAGES/DRESSINGS) ×1 IMPLANT
SUT ETHILON 2 0 PS N (SUTURE) ×1 IMPLANT
TOWEL OR 17X26 10 PK STRL BLUE (TOWEL DISPOSABLE) ×2 IMPLANT

## 2021-07-26 NOTE — Anesthesia Postprocedure Evaluation (Signed)
Anesthesia Post Note  Patient: [name]  Procedure(s) Performed: EXCHANGE SUPRAPUBIC CATHETER AMD BOTOX INJECTION 300 UNITS     Patient location during evaluation: PACU Anesthesia Type: MAC Level of consciousness: awake and alert Pain management: pain level controlled Vital Signs Assessment: post-procedure vital signs reviewed and stable Respiratory status: spontaneous breathing, nonlabored ventilation, respiratory function stable and patient connected to nasal cannula oxygen Cardiovascular status: stable and blood pressure returned to baseline Postop Assessment: no apparent nausea or vomiting Anesthetic complications: no   No notable events documented.  Last Vitals:  Vitals:   07/26/21 0945 07/26/21 1000  BP: 113/64   Pulse: 71 62  Resp: 18 12  Temp:    SpO2: 100% 100%    Last Pain:  Vitals:   07/26/21 1000  TempSrc:   PainSc: 0-No pain                 Xayvion Shirah

## 2021-07-26 NOTE — Op Note (Signed)
Operative Note  Preoperative diagnosis:  1.  Neurogenic bladder with detrusor overactivity  Postoperative diagnosis: 1.  Same  Procedure(s): 1.  Cystoscopy with 300 units of intra detrusor Botox 2.  Complicated suprapubic tube exchange  Surgeon: Link Snuffer, MD  Assistants: None  Anesthesia: General  Complications: None immediate  EBL: Minimal  Specimens: 1.  None  Drains/Catheters: 1.  22 French silicone catheter  Intraoperative findings: 1.  Normal anterior urethra 2.  Small false passage at the prostatic urethra 3.  Edematous small capacity bladder.  No obvious tumors.  Some tiny calculi that were evacuated through the scope.  300 units of Botox instilled.  Suprapubic tube tract dilated to 68 [place] and a 22 [place] silicone catheter placed  Indication: 40 year old male with neurogenic bladder with detrusor overactivity presents for the previously mentioned operation.  Description of procedure:  The patient was identified and consent was obtained.  The patient was taken to the operating room and placed in the supine position.  The patient was placed under moderate sedation anesthesia.  Perioperative antibiotics were administered.  The patient was placed in dorsal lithotomy.  Patient was prepped and draped in a standard sterile fashion and a timeout was performed.  The suprapubic tube tract was sequentially dilated from 18 [place] up to 24 [place].  This was dilated with male sounds.  A 22 French silicone catheter was then easily advanced into the bladder and instilled with 5 cc of sterile water.  The Botox cystoscope was inserted into the urethra and into the bladder.  He had a high riding bladder neck.  Cystoscopy was performed with findings noted above.  300 units of Botox was instilled into the bladder systematically throughout the bladder.  The scope was withdrawn and the catheter was placed by gravity drainage.  This include the operation.  Patient tolerated the procedure  well and was stable postoperative.  Plan: Follow-up in 6 months for repeat Botox.

## 2021-07-26 NOTE — Progress Notes (Signed)
Brief Pharmacy Note: Meropenem ordered for bacteremia  Pt in Short Stay for SP catheter exchange  Sent one dose Meropenem 1gm to Short Stay, unsure if Urologist desired one pre-op dose or if abx to continue. No hx of ESBL organisms, previous Proteus urine cx x2 this year, resistant only to Nitrofurantoin  Minda Ditto PharmD [facility] [phone] 07/26/2021, 8:06 AM

## 2021-07-26 NOTE — H&P (Signed)
CC/HPI: Patient with a history of neurogenic bladder with detrusor overactivity. He is managed with suprapubic tube. He is also managed with Botox. Last performed in April. He is due for this. He comes in with a suprapubic tube that is not draining. We exchanged it and it is now draining well.       ALLERGIES: Cipro TABS Contrast Media Ready-Box MISC Latex Gloves MISC Vancomycin HCl SOLR     MEDICATIONS: Oxybutynin Chloride 5 mg tablet TAKE 1 TABLET BY MOUTH TWICE A DAY  Antacid  Calcium + D  Cranberry Fruit  Docusate Sodium  Klor-Con 20 meq packet  Metoprolol Tartrate 50 mg tablet Oral  Multi-Day Vitamins tablet Oral  Rosuvastatin Calcium 20 mg tablet  Tylenol Arthritis PRN  Verapamil Hcl 1 PO Daily  Vitamin C  Zofran 4 mg tablet Oral      GU PSH: Catheterization For Collection Of Specimen, Single Patient, All Places Of Service 12/25/2018, 2017/12/24, 2017/12/24 Catheterize For Residual - 12/24/2017, 12-25-2015 Compl Change SP Tube - 08/11/2020, 25-Dec-2019, 25-Dec-2018 Complex cystometrogram, w/ void pressure and urethral pressure profile studies, any technique - December 25, 2018 Complex Uroflow - 2020 Cysto Bladder Stone <2.5cm - 07/01/2020 Cystoscopy - 04/27/2020, 25-Dec-2019, 12/25/19 Cystourethroscopy, W/Injection For Chemodenervation Of Bladder - 12/14/2020, 07/01/2020, December 25, 2019, Dec 25, 2018 Emg surf Electrd - 12/25/2018 Inject For cystogram - 12/25/18 Insert Bladder Cath; Complex - 04/27/2020, 25-Dec-2019 Intrabd voidng Press - 2020 Simple Change SP Tube - 06/23/2021, 06/09/2021, 05/27/2021, 05/10/2021, 04/30/2021, 03/26/2021, 02/23/2021, 02/10/2021, 01/13/2021, 12/14/2020, 11/24/2020, 10/30/2020, 10/15/2020, 10/06/2020, 09/15/2020, 08/28/2020, 07/31/2020, 07/17/2020, 07/01/2020, 06/09/2020, 12-25-2019, 12-25-2019, 12/25/19, Dec 25, 2019, 08/23/2019, 07/19/2019, 12/25/18, 12-25-2018        PSH Notes: Ileostomy, Colostomy, Cholecystectomy, Appendectomy    NON-GU PSH: Appendectomy - 12/25/14 Cholecystectomy (open) - 12-25-2014 Colostomy - December 25, 2014       GU PMH: Bladder, Neuromuscular dysfunction, Unspec - 06/23/2021, -  06/09/2021, - 05/27/2021, - 05/10/2021, - 04/30/2021, - 03/26/2021, - 02/23/2021, - 02/10/2021, - 01/13/2021, - 11/24/2020, - 10/30/2020, - 10/15/2020, - 09/15/2020, - 08/11/2020, - 07/17/2020, - 07/16/2020, - 06/09/2020, Neurogenic bladder, - 12-25-2014 Bladder, Oth neuromuscular dysfunction - 02/10/2021, - 01/13/2021, - 10/06/2020, - 06/09/2020, - 12/25/2018 Unihibited neuropathic bladder - 02/10/2021, - 10/06/2020, - 12-25-18, - 12/25/2018 Incomplete bladder emptying - 01/13/2021, - 10/06/2020, - 08/28/2020, - 07/31/2020, - 2020 Gross hematuria - 07/16/2020 Incontinence w/o Sensation - 2020 Candidal balanitis - Dec 24, 2017 Oth urogenital candidiasis - Dec 24, 2017 Obstructive and reflux uropathy, Unspec - 2017-12-24 Chronic cystitis (w/o hematuria), Chronic cystitis - 12/25/14 Urge incontinence, Urge incontinence of urine - 2014-12-25     NON-GU PMH: Other mechanical complication of cystostomy catheter, initial encounter - 05/27/2021, - 10/15/2020, - 09/15/2020, - 07/17/2020, December 25, 2019 Spina bifida, unspecified - Dec 25, 2018 Encounter for general adult medical examination without abnormal findings, Encounter for preventive health examination - 12-25-14 Personal history of other diseases of the circulatory system, History of hypertension - 25-Dec-2014 Personal history of other diseases of the nervous system and sense organs, History of hydrocephalus - 25-Dec-2014 Personal history of other specified (corrected) congenital malformations of nervous system and sense organs, History of spina bifida - 2014/12/25 Personal history of other specified conditions, History of heartburn - 12/25/14     FAMILY HISTORY: Death - Father Diabetes - Runs In Family Hypertension - Runs In Family Tuberculosis - Runs In Family    SOCIAL HISTORY: Marital Status: Single Preferred Language: English; Ethnicity: Not Hispanic Or Latino; Race: White Current Smoking Status: Patient has never smoked.  Drinks 1 caffeinated drink per day.     Notes:  Caffeine use, Never smoker, Alcohol use, Disabled, Single, Number of children, Never used  tobacco    REVIEW OF SYSTEMS:    GU Review Male:   Patient denies frequent urination, hard to postpone urination, burning/ pain with urination, get up at night to urinate, leakage of urine, stream starts and stops, trouble starting your stream, have to strain to urinate , erection problems, and penile pain.  Gastrointestinal (Upper):   Patient denies nausea, vomiting, and indigestion/ heartburn.  Gastrointestinal (Lower):   Patient denies diarrhea and constipation.  Constitutional:   Patient denies fever, night sweats, weight loss, and fatigue.  Skin:   Patient denies skin rash/ lesion and itching.  Eyes:   Patient denies blurred vision and double vision.  Ears/ Nose/ Throat:   Patient denies sore throat and sinus problems.  Hematologic/Lymphatic:   Patient denies swollen glands and easy bruising.  Cardiovascular:   Patient denies leg swelling and chest pains.  Respiratory:   Patient denies cough and shortness of breath.  Endocrine:   Patient denies excessive thirst.  Musculoskeletal:   Patient denies back pain and joint pain.  Neurological:   Patient denies headaches and dizziness.  Psychologic:   Patient denies depression and anxiety.    VITAL SIGNS: None    MULTI-SYSTEM PHYSICAL EXAMINATION:    Gastrointestinal: Suprapubic tube in place draining clear yellow urine      Complexity of Data:  Source Of History:  Patient  Records Review:   Previous Patient Records    PROCEDURES:         Simple Change SP Tube - 51705  The patient's indwelling SP tube was carefully removed. A 20 French Foley catheter was inserted into the bladder using sterile technique. The patient was taught routine catheter care. Hand irrigation of the bladder with sterile water was performed. A bedside bag was connected. A Urinalysis Auto W/Scope was sent to the lab.    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 - 29199 - Botox 200mg  (Cystourethroscopy, W/Injection For Chemodenervation Of Bladder) - 41937, T0240            Document Letter(s):  Created for Patient: Clinical Summary           Notes:   Schedule for repeat Botox. Continue suprapubic catheter.         Next Appointment:      Next Appointment: 07/21/2021 11:00 AM    Appointment Type: Nurse Visit NO Urine    Location: [facility] [id number]    Provider: NVPodA NVPodA    Reason for Visit: 1 mo cath        Signed by Link Snuffer, III, M.D. on 07/08/21 at 10:10 AM (EDT

## 2021-07-26 NOTE — Transfer of Care (Signed)
Immediate Anesthesia Transfer of Care Note  Patient: [name]  Procedure(s) Performed: EXCHANGE SUPRAPUBIC CATHETER AMD BOTOX INJECTION 300 UNITS  Patient Location: PACU  Anesthesia Type:MAC  Level of Consciousness: awake, alert  and oriented  Airway & Oxygen Therapy: Patient Spontanous Breathing and Patient connected to face mask oxygen  Post-op Assessment: Report given to RN and Post -op Vital signs reviewed and stable  Post vital signs: Reviewed and stable  Last Vitals:  Vitals Value Taken Time  BP 138/124 07/26/21 0926  Temp    Pulse 78 07/26/21 0928  Resp 22 07/26/21 0928  SpO2 100 % 07/26/21 0928  Vitals shown include unvalidated device data.  Last Pain:  Vitals:   07/26/21 0715  TempSrc: Oral         Complications: No notable events documented.

## 2021-07-27 ENCOUNTER — Encounter (HOSPITAL_COMMUNITY): Payer: Self-pay | Admitting: Urology

## 2021-09-22 ENCOUNTER — Inpatient Hospital Stay (HOSPITAL_COMMUNITY)
Admission: EM | Admit: 2021-09-22 | Discharge: 2021-09-24 | DRG: 178 | Disposition: A | Discharge status: Home / Self Care | Payer: Medicare Other | Attending: Internal Medicine | Admitting: Internal Medicine

## 2021-09-22 ENCOUNTER — Emergency Department (HOSPITAL_COMMUNITY): Payer: Medicare Other

## 2021-09-22 ENCOUNTER — Encounter (HOSPITAL_COMMUNITY): Payer: Self-pay

## 2021-09-22 DIAGNOSIS — N179 Acute kidney failure, unspecified: Secondary | ICD-10-CM | POA: Diagnosis not present

## 2021-09-22 DIAGNOSIS — E860 Dehydration: Secondary | ICD-10-CM | POA: Diagnosis not present

## 2021-09-22 DIAGNOSIS — Z888 Allergy status to other drugs, medicaments and biological substances status: Secondary | ICD-10-CM

## 2021-09-22 DIAGNOSIS — D72829 Elevated white blood cell count, unspecified: Secondary | ICD-10-CM | POA: Diagnosis present

## 2021-09-22 DIAGNOSIS — R8290 Unspecified abnormal findings in urine: Secondary | ICD-10-CM | POA: Diagnosis present

## 2021-09-22 DIAGNOSIS — Z881 Allergy status to other antibiotic agents status: Secondary | ICD-10-CM

## 2021-09-22 DIAGNOSIS — Z91040 Latex allergy status: Secondary | ICD-10-CM

## 2021-09-22 DIAGNOSIS — E871 Hypo-osmolality and hyponatremia: Secondary | ICD-10-CM | POA: Diagnosis present

## 2021-09-22 DIAGNOSIS — Q0703 Arnold-Chiari syndrome with spina bifida and hydrocephalus: Secondary | ICD-10-CM

## 2021-09-22 DIAGNOSIS — Z982 Presence of cerebrospinal fluid drainage device: Secondary | ICD-10-CM

## 2021-09-22 DIAGNOSIS — Z9359 Other cystostomy status: Secondary | ICD-10-CM

## 2021-09-22 DIAGNOSIS — G808 Other cerebral palsy: Secondary | ICD-10-CM | POA: Diagnosis present

## 2021-09-22 DIAGNOSIS — I10 Essential (primary) hypertension: Secondary | ICD-10-CM | POA: Diagnosis present

## 2021-09-22 DIAGNOSIS — Z932 Ileostomy status: Secondary | ICD-10-CM

## 2021-09-22 DIAGNOSIS — Z993 Dependence on wheelchair: Secondary | ICD-10-CM

## 2021-09-22 DIAGNOSIS — E872 Acidosis, unspecified: Secondary | ICD-10-CM | POA: Diagnosis not present

## 2021-09-22 DIAGNOSIS — E861 Hypovolemia: Secondary | ICD-10-CM | POA: Diagnosis present

## 2021-09-22 DIAGNOSIS — U071 COVID-19: Principal | ICD-10-CM | POA: Diagnosis present

## 2021-09-22 LAB — CBC WITH DIFFERENTIAL/PLATELET
Abs Immature Granulocytes: 0.18 10*3/uL — ABNORMAL HIGH (ref 0.00–0.07)
Basophils Absolute: 0.1 10*3/uL (ref 0.0–0.1)
Basophils Relative: 1 %
Eosinophils Absolute: 0 10*3/uL (ref 0.0–0.5)
Eosinophils Relative: 0 %
HCT: 58.8 % — ABNORMAL HIGH (ref 39.0–52.0)
Hemoglobin: 19.5 g/dL — ABNORMAL HIGH (ref 13.0–17.0)
Immature Granulocytes: 1 %
Lymphocytes Relative: 13 %
Lymphs Abs: 2.2 10*3/uL (ref 0.7–4.0)
MCH: 25.1 pg — ABNORMAL LOW (ref 26.0–34.0)
MCHC: 33.2 g/dL (ref 30.0–36.0)
MCV: 75.6 fL — ABNORMAL LOW (ref 80.0–100.0)
Monocytes Absolute: 1.8 10*3/uL — ABNORMAL HIGH (ref 0.1–1.0)
Monocytes Relative: 11 %
Neutro Abs: 12.5 10*3/uL — ABNORMAL HIGH (ref 1.7–7.7)
Neutrophils Relative %: 74 %
Platelets: 343 10*3/uL (ref 150–400)
RBC: 7.78 MIL/uL — ABNORMAL HIGH (ref 4.22–5.81)
RDW: 16.4 % — ABNORMAL HIGH (ref 11.5–15.5)
WBC: 16.7 10*3/uL — ABNORMAL HIGH (ref 4.0–10.5)
nRBC: 0 % (ref 0.0–0.2)

## 2021-09-22 LAB — COMPREHENSIVE METABOLIC PANEL
ALT: 71 U/L — ABNORMAL HIGH (ref 0–44)
AST: 29 U/L (ref 15–41)
Albumin: 4.2 g/dL (ref 3.5–5.0)
Alkaline Phosphatase: 146 U/L — ABNORMAL HIGH (ref 38–126)
Anion gap: 20 — ABNORMAL HIGH (ref 5–15)
BUN: 49 mg/dL — ABNORMAL HIGH (ref 6–20)
CO2: 16 mmol/L — ABNORMAL LOW (ref 22–32)
Calcium: 9.4 mg/dL (ref 8.9–10.3)
Chloride: 84 mmol/L — ABNORMAL LOW (ref 98–111)
Creatinine, Ser: 1.44 mg/dL — ABNORMAL HIGH (ref 0.61–1.24)
GFR, Estimated: >60 mL/min (ref >60)
Glucose, Bld: 135 mg/dL — ABNORMAL HIGH (ref 70–99)
Potassium: 5.1 mmol/L (ref 3.5–5.1)
Sodium: 120 mmol/L — ABNORMAL LOW (ref 135–145)
Total Bilirubin: 1 mg/dL (ref 0.3–1.2)
Total Protein: 8.9 g/dL — ABNORMAL HIGH (ref 6.5–8.1)

## 2021-09-22 LAB — POC OCCULT BLOOD, ED: Fecal Occult Bld: POSITIVE — AB

## 2021-09-22 LAB — RESP PANEL BY RT-PCR (FLU A&B, COVID) ARPGX2
Influenza A by PCR: NEGATIVE
Influenza B by PCR: NEGATIVE
SARS Coronavirus 2 by RT PCR: POSITIVE — AB

## 2021-09-22 LAB — LACTIC ACID, PLASMA
Lactic Acid, Venous: 1.8 mmol/L (ref 0.5–1.9)
Lactic Acid, Venous: 2.2 mmol/L (ref 0.5–1.9)

## 2021-09-22 LAB — LIPASE, BLOOD: Lipase: 21 U/L (ref 11–51)

## 2021-09-22 LAB — CREATININE, SERUM
Creatinine, Ser: 0.87 mg/dL (ref 0.61–1.24)
GFR, Estimated: >60 mL/min (ref >60)

## 2021-09-22 MED ORDER — SODIUM CHLORIDE 0.9 % IV BOLUS
1000.0000 mL | Freq: Once | INTRAVENOUS | Status: AC
  Administered 2021-09-22: 1000 mL via INTRAVENOUS

## 2021-09-22 MED ORDER — SODIUM CHLORIDE 0.9 % IV SOLN
200.0000 mg | Freq: Once | INTRAVENOUS | Status: AC
  Administered 2021-09-22: 200 mg via INTRAVENOUS
  Filled 2021-09-22: qty 40

## 2021-09-22 MED ORDER — SODIUM CHLORIDE 0.9 % IV SOLN: INTRAVENOUS | Status: DC

## 2021-09-22 MED ORDER — DICYCLOMINE HCL 10 MG/ML IM SOLN
20.0000 mg | Freq: Once | INTRAMUSCULAR | Status: AC
  Administered 2021-09-22: 20 mg via INTRAMUSCULAR
  Filled 2021-09-22: qty 2

## 2021-09-22 MED ORDER — ONDANSETRON HCL 4 MG/2ML IJ SOLN
4.0000 mg | Freq: Once | INTRAMUSCULAR | Status: AC
  Administered 2021-09-22: 4 mg via INTRAVENOUS
  Filled 2021-09-22: qty 2

## 2021-09-22 MED ORDER — HEPARIN SODIUM (PORCINE) 5000 UNIT/ML IJ SOLN
5000.0000 [IU] | Freq: Three times a day (TID) | INTRAMUSCULAR | Status: DC
  Administered 2021-09-22 – 2021-09-24 (×5): 5000 [IU] via SUBCUTANEOUS
  Filled 2021-09-22 (×5): qty 1

## 2021-09-22 MED ORDER — SODIUM CHLORIDE 0.9 % IV SOLN
100.0000 mg | Freq: Every day | INTRAVENOUS | Status: DC
  Administered 2021-09-23: 100 mg via INTRAVENOUS
  Filled 2021-09-22: qty 20

## 2021-09-22 NOTE — ED Provider Triage Note (Signed)
Emergency Medicine Provider Triage Evaluation Note  [name] , a 41 y.o. male  was evaluated in triage.  Presents with multiple complaints.  Per his family member that is with him, he has been having upper respiratory infection that started about a week and a half ago.  He overall has mostly improved from this.  Over the past couple of days he also developed some nausea vomiting and diarrhea and had exposure to a GI virus.  This also started to improve.  The biggest complaint is that since yesterday, patient's mentation has been off.  He is more somnolent than normal.  He also has been complaining of a headache.  Patient has a VP shunt in place and has had problems with this before where he has had to have it replaced.  He also has noticed that he has had decreased urine output with darker urine.  He does have a suprapubic catheter and an ileostomy in place.  She has noticed some darker stools than normal that she states appear black.  Patient has denied any fevers or chills.  There is no neck stiffness or tightness.  Review of Systems  Positive: Headache, AMS, melena, decreased UOP, nausea, vomiting, diarrhea Negative:   Physical Exam  BP (!) 133/91 (BP Location: Left Arm)    Pulse (!) 110    Temp 97.7 F (36.5 C) (Axillary)    Resp 18    SpO2 96%  Gen:   Patient will arouse to voice.  He is notably more sleepy than normal and is not participating in the conversation unless you get his attention, no distress.  He is oriented x3 Resp:  Normal effort. CTA bilaterally MSK:   Moves extremities without difficulty  Other:  No abd ttp.  Ileostomy and Suprapubic catheter in place  Medical Decision Making  Medically screening exam initiated at 2:55 PM.  Appropriate orders placed.  [name] was informed that the remainder of the evaluation will be completed by another provider, this initial triage assessment does not replace that evaluation, and the importance of remaining in the ED until their  evaluation is complete.     Adolphus Birchwood, PA-C 09/22/21 1458

## 2021-09-22 NOTE — H&P (Signed)
History and Physical    [name] BHA:[id number] DOB: 1980-12-25 DOA: 09/22/2021  PCP: Buzzy Han, MD   Chief Complaint: Poor PO intake  HPI: [name] is a 41 y.o. 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 intake dehydration and malnutrition.  Approximately January 5 most of his family had "GI bug" with some nausea loose stool and generally poor p.o. intake.  Patient had a worsening ostomy output as well as decreased urine output and family was concerned for dehydration so brought him to the ED.  In the ED patient tested positive for COVID, likely the etiology for his symptoms, he is unfortunately 1 week out from initial symptoms though making treatment at this point somewhat limited.  ED Course: Patient received Remdesivir x1 as well as IV fluids; without any respiratory symptomatology or hypoxia.  Review of Systems: As per HPI nausea questionable diarrhea poor p.o. intake; without shortness of breath chest pain headache fever chills constipation or congestion.   Assessment/Plan   AKI Anion gap metabolic acidosis, lactic acidosis Secondary to poor p.o. intake, confirmed with mother at bedside Continue IV fluids, follow morning labs Encourage increased p.o. intake  Acute Covid viral infection, without hypoxia Appears to be limited to GI symptoms, poor p.o. intake and general malaise Patient's initial symptoms began on January 5 some 6 days prior to admission, no indication for Remdesivir, steroids, or Actemra at this time -we will continue supportive care  Hypovolemic/Hyponatremia Secondary to above, continue IV fluids and morning labs  DVT prophylaxis: Heparin Code Status: Full Family Communication: Mother at bedside Status is: Observation  Dispo: The patient is from: Home              Anticipated d/c is to: Home               Anticipated d/c date is: 24 to 48 hours              Patient currently not medically stable for discharge given need for IV fluids  Consultants:  None  Procedures:  None   Past Medical History:  Diagnosis Date   Arnold-Chiari malformation (HCC)    Congenital paraplegia (HCC)    MID-ABDOMIN DOWN  S/P SPINA BIFIDA   GERD (gastroesophageal reflux disease)    Heart palpitations    SECONDARY TO VP SHUNT DRAIN   History of seizure    per pt mother, no seizures since age 87   Hypertension    Ileostomy in place [facility]) 2014   Migraines followed by neurologist @[facility]    taking verapamil   Neurogenic bladder    INCONTINENT -   Neurogenic bowel    S/P VP shunt    Spina bifida with hydrocephalus, lumbar region ([place])    CONGENITAL--   VP SHUNT in place--  PARALYZIED MID ABDOMEN DOWN   Urinary incontinence with continuous leakage    Wears glasses    Wheelchair bound    CAN TRASFER SELF WITHOUT BOARD    Past Surgical History:  Procedure Laterality Date   BACK SURGERY  infant   "get skin to stretch over his back; spinal bifida"   BACK SURGERY  age 56   scoliosis and  removed hump on his back (kyphosis) and put rods in to stabalize back"   BOTOX INJECTION N/A 11/14/2018   Procedure: BOTOX INJECTION CYSTOSCOPY;  Surgeon: Lucas Mallow, MD;  Location: [facility];  Service: Urology;  Laterality: N/A;   BOTOX INJECTION N/A 05/22/2019   Procedure: BOTOX INJECTION;  Surgeon: Lucas Mallow, MD;  Location: [facility];  Service: Urology;  Laterality: N/A;   BOTOX INJECTION N/A 11/13/2019   Procedure: CYSTOSCOPY BOTOX INJECTION;  Surgeon: Lucas Mallow, MD;  Location: [facility];  Service: Urology;  Laterality: N/A;   BOTOX INJECTION N/A 07/01/2020   Procedure: CYSTOSCOPY, BOTOX INJECTION, BLADDER FULGERATION, SUPRAPUBIC TUBE EXCHANGE;  Surgeon: Ceasar Mons, MD;  Location: [facility];  Service: Urology;  Laterality: N/A;   BOTOX INJECTION N/A 12/14/2020   Procedure: BOTOX INJECTION 300  UNITS SUPRAPUBIC TUBE CHANGE;  Surgeon: Lucas Mallow, MD;  Location: [facility];  Service: Urology;  Laterality: N/A;   CHOLECYSTECTOMY N/A 07/06/2013   Procedure: LAPAROSCOPIC CHOLECYSTECTOMY WITH INTRAOPERATIVE CHOLANGIOGRAM;  Surgeon: Madilyn Hook, DO;  Location: [facility];  Service: General;  Laterality: N/A;   COLON RESECTION N/A 07/08/2013   Procedure: EXPLORATORY LAPAROSCOPY, DIAGNOSTIC LAPAROSCOPY, PARTIAL COLECTOMY, ABDOMINAL WASHOUT, ABDOMINAL WOUND VAC;  Surgeon: Madilyn Hook, DO;  Location: [facility];  Service: General;  Laterality: N/A;   HIP SURGERY Bilateral AS CHILD   TENDON RELEASE   HOLMIUM LASER APPLICATION N/A 75/17/0017   Procedure: HOLMIUM LASER OF BLADDER STONE;  Surgeon: Ceasar Mons, MD;  Location: [facility];  Service: Urology;  Laterality: N/A;   INCISION AND DRAINAGE OF WOUND N/A 11/04/2013   Procedure: IRRIGATION AND DEBRIDEMENT ABDOMINAL WOUND WITH PLACEMENT OF ACELL/VAC;  Surgeon: Theodoro Kos, DO;  Location: [facility];  Service: Plastics;  Laterality: N/A;   INSERTION OF SUPRAPUBIC CATHETER N/A 12/23/2019   Procedure: FLEXIBLE CYSTOSCOPE WITH FOLEY PLACEMENT OVER WIRE.;  Surgeon: Robley Fries, MD;  Location: [facility];  Service: Urology;  Laterality: N/A;   INSERTION OF SUPRAPUBIC CATHETER N/A 07/26/2021   Procedure: EXCHANGE SUPRAPUBIC CATHETER AMD BOTOX INJECTION 300 UNITS;  Surgeon: Lucas Mallow, MD;  Location: [facility];  Service: Urology;  Laterality: N/A;   IR CATHETER TUBE CHANGE  02/15/2019   IR CATHETER TUBE CHANGE  03/01/2019   IR CATHETER TUBE CHANGE  05/06/2019   IR CATHETER TUBE CHANGE  03/06/2020   IR CATHETER TUBE CHANGE  04/16/2020   IR CATHETER TUBE CHANGE  04/29/2020   ORCHIOPEXY Bilateral AS CHILD   UNDESCENDED TESTIS   RIGHT COLECTOMY/  APPENDECTOMY/  ILEOSTOMY  NOV 2014  [facility]   surgical complication with cholecystectomy   UMBILICAL HERNIA REPAIR  child   VENTRICULOPERITONEAL SHUNT  11-12-2018 per pt and mother---LAST  REVISION ~ 06/2015   DUE TO ABD WOUND--- SHUNT DRAINS TO HEART (12/23/2015)     reports that he has never smoked. He has never used smokeless tobacco. He reports that he does not drink alcohol and does not use drugs.  Allergies  Allergen Reactions   Latex Hives   Succinylcholine Rash    Other reaction(s): Other (See Comments) Pt is half [place] - prolonged paralysis due to compromised metabolism. Succinylcholine given on 07/07/15 at outside hospital and pt experienced paralysis > 3 hours.  Due to being native [place], his mother states that he takes longer to "come out of it"   Ciprofloxacin Other (See Comments)    IV only-- caused burning in arm    Vancomycin Itching and Swelling   Adhesive [Tape] Hives and Rash    Family History  Problem Relation Age of Onset   Diabetes Other    Hypertension Other    Cancer Other  Stroke Other     Prior to Admission medications   Medication Sig Start Date End Date Taking? Authorizing Provider  acetaminophen (TYLENOL) 650 MG CR tablet Take 1,300 mg by mouth every 8 (eight) hours as needed for pain.    [provider]  Ascorbic Acid (VITAMIN C) 1000 MG tablet Take 1,000 mg by mouth daily with lunch.    [provider]  calcium elemental as carbonate (CALCIUM ANTACID ULTRA STRENGTH) 400 MG chewable tablet Chew 1,000 mg by mouth 3 (three) times daily.    [provider]  calcium-vitamin D (OSCAL WITH D) 500-200 MG-UNIT tablet Take 1 tablet by mouth daily in the afternoon.    [provider]  Cranberry-Vitamin C-Vitamin E 4200-20-3 MG-MG-UNIT CAPS Take 1 capsule by mouth in the morning, at noon, and at bedtime.    [provider]  docusate sodium (COLACE) 100 MG capsule Take 200 mg by mouth in the morning.    [provider]  metoprolol tartrate (LOPRESSOR) 50 MG tablet Take 50 mg by mouth 2 (two) times daily.    [provider]  Multiple Vitamin (MULTIVITAMIN WITH MINERALS) TABS  tablet Take 1 tablet by mouth daily in the afternoon.    [provider]  ondansetron (ZOFRAN ODT) 4 MG disintegrating tablet Take 1 tablet (4 mg total) by mouth every 8 (eight) hours as needed for nausea or vomiting. 10/19/20   Sherwood Gambler, MD  oxybutynin (DITROPAN) 5 MG tablet Take 5 mg by mouth 3 (three) times daily. 10/03/19   [provider]  potassium chloride (KLOR-CON) 20 MEQ packet Take 20 mEq by mouth See admin instructions. Taking twice daily on two days a week, Mon, Thursday    [provider]  rosuvastatin (CRESTOR) 20 MG tablet Take 20 mg by mouth in the morning. 09/10/18   [provider]  verapamil (CALAN-SR) 120 MG CR tablet Take 120 mg by mouth at bedtime. 09/26/19   [provider]    Physical Exam: Vitals:   09/22/21 1431 09/22/21 1439 09/22/21 1615  BP: (!) 133/91  126/89  Pulse: (!) 110  (!) 112  Resp: 18  18  Temp:  97.7 F (36.5 C)   TempSrc:  Axillary   SpO2: 96%  99%    Constitutional: NAD, calm, comfortable Vitals:   09/22/21 1431 09/22/21 1439 09/22/21 1615  BP: (!) 133/91  126/89  Pulse: (!) 110  (!) 112  Resp: 18  18  Temp:  97.7 F (36.5 C)   TempSrc:  Axillary   SpO2: 96%  99%   General:  Pleasantly resting in bed, No acute distress. HEENT:  Normocephalic atraumatic.  Sclerae nonicteric, noninjected.  Extraocular movements intact bilaterally. Neck:  Without mass or deformity.  Trachea is midline. Lungs:  Clear to auscultate bilaterally without rhonchi, wheeze, or rales. Heart:  Regular rate and rhythm.  Without murmurs, rubs, or gallops. Abdomen:  Soft, nontender, nondistended.  Without guarding or rebound.  Ostomy site clean dry intact; suprapubic catheter intact. Extremities: Without cyanosis, clubbing, edema, or obvious deformity. Vascular:  Dorsalis pedis and posterior tibial pulses palpable bilaterally. Skin:  Warm and dry, no erythema, no ulcerations.  Labs on Admission: I have personally  reviewed following labs and imaging studies  CBC: No results for input(s): WBC, NEUTROABS, HGB, HCT, MCV, PLT in the last 168 hours. Basic Metabolic Panel: Recent Labs  Lab 09/22/21 1518  NA 120*  K 5.1  CL 84*  CO2 16*  GLUCOSE 135*  BUN  49*  CREATININE 1.44*  CALCIUM 9.4   GFR: CrCl cannot be calculated (Unknown ideal weight.). Liver Function Tests: Recent Labs  Lab 09/22/21 1518  AST 29  ALT 71*  ALKPHOS 146*  BILITOT 1.0  PROT 8.9*  ALBUMIN 4.2   Recent Labs  Lab 09/22/21 1518  LIPASE 21   No results for input(s): AMMONIA in the last 168 hours. Coagulation Profile: No results for input(s): INR, PROTIME in the last 168 hours. Cardiac Enzymes: No results for input(s): CKTOTAL, CKMB, CKMBINDEX, TROPONINI in the last 168 hours. BNP (last 3 results) No results for input(s): PROBNP in the last 8760 hours. HbA1C: No results for input(s): HGBA1C in the last 72 hours. CBG: No results for input(s): GLUCAP in the last 168 hours. Lipid Profile: No results for input(s): CHOL, HDL, LDLCALC, TRIG, CHOLHDL, LDLDIRECT in the last 72 hours. Thyroid Function Tests: No results for input(s): TSH, T4TOTAL, FREET4, T3FREE, THYROIDAB in the last 72 hours. Anemia Panel: No results for input(s): VITAMINB12, FOLATE, FERRITIN, TIBC, IRON, RETICCTPCT in the last 72 hours. Urine analysis:    Component Value Date/Time   COLORURINE YELLOW 01/12/2021 1348   APPEARANCEUR TURBID (A) 01/12/2021 1348   LABSPEC 1.025 01/12/2021 1348   PHURINE 7.0 01/12/2021 1348   GLUCOSEU NEGATIVE 01/12/2021 1348   HGBUR SMALL (A) 01/12/2021 1348   BILIRUBINUR SMALL (A) 01/12/2021 1348   KETONESUR 5 (A) 01/12/2021 1348   PROTEINUR >=300 (A) 01/12/2021 1348   UROBILINOGEN 0.2 07/23/2015 1036   NITRITE NEGATIVE 01/12/2021 1348   LEUKOCYTESUR TRACE (A) 01/12/2021 1348    Radiological Exams on Admission: CT Head Wo Contrast  Result Date: 09/22/2021 CLINICAL DATA:  Intracranial shunt placement EXAM:  CT HEAD WITHOUT CONTRAST TECHNIQUE: Contiguous axial images were obtained from the base of the skull through the vertex without intravenous contrast. RADIATION DOSE REDUCTION: This exam was performed according to the departmental dose-optimization program which includes automated exposure control, adjustment of the mA and/or kV according to patient size and/or use of iterative reconstruction technique. COMPARISON:  Head CT dated Jan 12, 2021 FINDINGS: Brain: Unchanged position of right posterior shunt catheter with tip terminating at the frontal horn of the left lateral ventricle. Stable ventricular size and configuration. Chronic white matter ischemic change. 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 No evidence of intracranial hemorrhage. Vascular: No hyperdense vessel or unexpected calcification. Skull: Normal. Negative for fracture or focal lesion. Sinuses/Orbits: No acute finding. Other: None. IMPRESSION: 1. Stable position of shunt catheter with stable ventricular size. 2. No acute intracranial abnormality. Electronically Signed   By: Yetta Glassman M.D.   On: 09/22/2021 15:42     [facility] For contact please use secure messenger on Epic  If 7PM-7AM, please contact night-coverage located on [email or web address]   09/22/2021, 4:37 PM

## 2021-09-22 NOTE — ED Provider Notes (Signed)
[place] DEPT Provider Note   CSN: [id number] Arrival date & time: 09/22/21  1423     History  Chief Complaint  Patient presents with   Abdominal Pain   Diarrhea    [name] is a 41 y.o. male.  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  Went to daughter's 1/2 Feeling sick starting 1/5-1/6 Mostly congestion, not really cough, tried nyquil Came home yesterday  Starting yesterday not eating or drinking Black stool started last night, no history of black stool like this Not having as much output from ostomy, color has changed, not eating or drinking  Yesterday said stomach hurt on the inside, upper, middle part Nausea, no vomiting No fever Not really cough Not urinating as much, not drinking as much (has 100oz cup and had barely any of it since yesterday) Also fatigue, not acting self No sore throat Has had headache beginning day before yesterday No shortness of breath or chest pain  Grandchildren with diarrhea, daughter (pt sister) and mom have it too  Has had flu/covid shots, last in November   Past Medical History:  Diagnosis Date   Arnold-Chiari malformation ([place])    Congenital paraplegia ([place])    MID-ABDOMIN DOWN  S/P SPINA BIFIDA   GERD (gastroesophageal reflux disease)    Heart palpitations    SECONDARY TO VP SHUNT DRAIN   History of seizure    per pt mother, no seizures since age 55   Hypertension    Ileostomy in place [facility]) 2014   Migraines followed by neurologist @[facility]    taking verapamil   Neurogenic bladder    INCONTINENT -   Neurogenic bowel    S/P VP shunt    Spina bifida with hydrocephalus, lumbar region ([place])    CONGENITAL--   VP SHUNT in place--  PARALYZIED MID ABDOMEN DOWN   Urinary incontinence with  continuous leakage    Wears glasses    Wheelchair bound    CAN TRASFER SELF WITHOUT BOARD    Past Surgical History:  Procedure Laterality Date   BACK SURGERY  infant   "get skin to stretch over his back; spinal bifida"   BACK SURGERY  age 3   scoliosis and  removed hump on his back (kyphosis) and put rods in to stabalize back"   BOTOX INJECTION N/A 11/14/2018   Procedure: BOTOX INJECTION CYSTOSCOPY;  Surgeon: Lucas Mallow, MD;  Location: [facility];  Service: Urology;  Laterality: N/A;   BOTOX INJECTION N/A 05/22/2019   Procedure: BOTOX INJECTION;  Surgeon: Lucas Mallow, MD;  Location: [facility];  Service: Urology;  Laterality: N/A;   BOTOX INJECTION N/A 11/13/2019   Procedure: CYSTOSCOPY BOTOX INJECTION;  Surgeon: Lucas Mallow, MD;  Location: [facility];  Service: Urology;  Laterality: N/A;   BOTOX INJECTION N/A 07/01/2020   Procedure: CYSTOSCOPY, BOTOX INJECTION, BLADDER FULGERATION, SUPRAPUBIC TUBE EXCHANGE;  Surgeon: Ceasar Mons, MD;  Location: [facility];  Service: Urology;  Laterality: N/A;   BOTOX INJECTION N/A 12/14/2020   Procedure: BOTOX INJECTION 300 UNITS SUPRAPUBIC TUBE CHANGE;  Surgeon: Lucas Mallow, MD;  Location: [facility];  Service: Urology;  Laterality: N/A;   CHOLECYSTECTOMY N/A 07/06/2013   Procedure: LAPAROSCOPIC CHOLECYSTECTOMY WITH INTRAOPERATIVE CHOLANGIOGRAM;  Surgeon: Madilyn Hook, DO;  Location: [facility];  Service:  General;  Laterality: N/A;   COLON RESECTION N/A 07/08/2013   Procedure: EXPLORATORY LAPAROSCOPY, DIAGNOSTIC LAPAROSCOPY, PARTIAL COLECTOMY, ABDOMINAL WASHOUT, ABDOMINAL WOUND VAC;  Surgeon: Madilyn Hook, DO;  Location: [facility];  Service: General;  Laterality: N/A;   HIP SURGERY Bilateral AS CHILD   TENDON RELEASE   HOLMIUM LASER APPLICATION N/A 60/45/4098   Procedure: HOLMIUM LASER OF BLADDER STONE;  Surgeon: Ceasar Mons, MD;  Location: [facility];  Service: Urology;  Laterality: N/A;   INCISION AND DRAINAGE OF WOUND N/A  11/04/2013   Procedure: IRRIGATION AND DEBRIDEMENT ABDOMINAL WOUND WITH PLACEMENT OF ACELL/VAC;  Surgeon: Theodoro Kos, DO;  Location: [place];  Service: Plastics;  Laterality: N/A;   INSERTION OF SUPRAPUBIC CATHETER N/A 12/23/2019   Procedure: FLEXIBLE CYSTOSCOPE WITH FOLEY PLACEMENT OVER WIRE.;  Surgeon: Robley Fries, MD;  Location: [facility];  Service: Urology;  Laterality: N/A;   INSERTION OF SUPRAPUBIC CATHETER N/A 07/26/2021   Procedure: EXCHANGE SUPRAPUBIC CATHETER AMD BOTOX INJECTION 300 UNITS;  Surgeon: Lucas Mallow, MD;  Location[facility];  Service: Urology;  Laterality: N/A;   IR CATHETER TUBE CHANGE  02/15/2019   IR CATHETER TUBE CHANGE  03/01/2019   IR CATHETER TUBE CHANGE  05/06/2019   IR CATHETER TUBE CHANGE  03/06/2020   IR CATHETER TUBE CHANGE  04/16/2020   IR CATHETER TUBE CHANGE  04/29/2020   ORCHIOPEXY Bilateral AS CHILD   UNDESCENDED TESTIS   RIGHT COLECTOMY/  APPENDECTOMY/  ILEOSTOMY  NOV 2014  [facility]   surgical complication with cholecystectomy   UMBILICAL HERNIA REPAIR  child   VENTRICULOPERITONEAL SHUNT  11-12-2018 per pt and mother---LAST REVISION ~ 06/2015   DUE TO ABD WOUND--- SHUNT DRAINS TO HEART (12/23/2015)     Home Medications Prior to Admission medications   Medication Sig Start Date End Date Taking? Authorizing Provider  acetaminophen (TYLENOL) 650 MG CR tablet Take 1,300 mg by mouth every 8 (eight) hours as needed for pain.   Yes [provider]  Ascorbic Acid (VITAMIN C) 1000 MG tablet Take 1,000 mg by mouth daily with lunch.   Yes [provider]  calcium elemental as carbonate (CALCIUM ANTACID ULTRA STRENGTH) 400 MG chewable tablet Chew 1,000 mg by mouth 3 (three) times daily.   Yes [provider]  calcium-vitamin D (OSCAL WITH D) 500-200 MG-UNIT tablet Take 1 tablet by mouth daily in the afternoon.   Yes [provider]  Cranberry-Vitamin C-Vitamin E 4200-20-3 MG-MG-UNIT CAPS Take 1 capsule by  mouth in the morning, at noon, and at bedtime.   Yes [provider]  docusate sodium (COLACE) 100 MG capsule Take 200 mg by mouth in the morning.   Yes [provider]  metoprolol tartrate (LOPRESSOR) 50 MG tablet Take 50 mg by mouth 2 (two) times daily.   Yes [provider]  Multiple Vitamin (MULTIVITAMIN WITH MINERALS) TABS tablet Take 1 tablet by mouth daily in the afternoon.   Yes [provider]  ondansetron (ZOFRAN ODT) 4 MG disintegrating tablet Take 1 tablet (4 mg total) by mouth every 8 (eight) hours as needed for nausea or vomiting. 10/19/20  Yes Sherwood Gambler, MD  oxybutynin (DITROPAN) 5 MG tablet Take 5 mg by mouth 3 (three) times daily. 10/03/19  Yes [provider]  potassium chloride (KLOR-CON) 20 MEQ packet Take 20 mEq by mouth See admin instructions. Taking twice daily on two days a week, Mon, Thursday   Yes [provider]  rosuvastatin (CRESTOR) 20  MG tablet Take 20 mg by mouth in the morning. 09/10/18  Yes [provider]  verapamil (CALAN-SR) 120 MG CR tablet Take 120 mg by mouth at bedtime. 09/26/19  Yes [provider]      Allergies    Latex, Succinylcholine, Ciprofloxacin, Vancomycin, and Adhesive [tape]    Review of Systems   Review of Systems SEE ABOVE   Physical Exam Updated Vital Signs BP 117/61    Pulse (!) 114    Temp 97.7 F (36.5 C) (Axillary)    Resp 18    SpO2 99%  Physical Exam Vitals and nursing note reviewed.  Constitutional:      General: He is not in acute distress.    Appearance: He is well-developed. He is not diaphoretic.  HENT:     Head: Normocephalic and atraumatic.  Eyes:     Conjunctiva/sclera: Conjunctivae normal.  Cardiovascular:     Rate and Rhythm: Normal rate and regular rhythm.  Pulmonary:     Effort: Pulmonary effort is normal. No respiratory distress.  Abdominal:     General: There is no distension.     Palpations: Abdomen is soft.     Tenderness: There  is no abdominal tenderness (mild soreness, nonfocal, no distention, ostomy with dark/green colored output). There is no guarding.  Musculoskeletal:     Cervical back: Normal range of motion.  Skin:    General: Skin is warm and dry.     Comments: Shunt palpated without tenderness/fluctuance  Neurological:     Mental Status: He is alert and oriented to person, place, and time.    ED Results / Procedures / Treatments   Labs (all labs ordered are listed, but only abnormal results are displayed) Labs Reviewed  RESP PANEL BY RT-PCR (FLU A&B, COVID) ARPGX2 - Abnormal; Notable for the following components:      Result Value   SARS Coronavirus 2 by RT PCR POSITIVE (*)    All other components within normal limits  URINALYSIS, ROUTINE W REFLEX MICROSCOPIC - Abnormal; Notable for the following components:   APPearance CLOUDY (*)    Hgb urine dipstick SMALL (*)    Ketones, ur 5 (*)    Protein, ur 30 (*)    Nitrite POSITIVE (*)    Leukocytes,Ua LARGE (*)    WBC, UA >50 (*)    Bacteria, UA RARE (*)    All other components within normal limits  COMPREHENSIVE METABOLIC PANEL - Abnormal; Notable for the following components:   Sodium 120 (*)    Chloride 84 (*)    CO2 16 (*)    Glucose, Bld 135 (*)    BUN 49 (*)    Creatinine, Ser 1.44 (*)    Total Protein 8.9 (*)    ALT 71 (*)    Alkaline Phosphatase 146 (*)    Anion gap 20 (*)    All other components within normal limits  CBC WITH DIFFERENTIAL/PLATELET - Abnormal; Notable for the following components:   WBC 16.7 (*)    RBC 7.78 (*)    Hemoglobin 19.5 (*)    HCT 58.8 (*)    MCV 75.6 (*)    MCH 25.1 (*)    RDW 16.4 (*)    Neutro Abs 12.5 (*)    Monocytes Absolute 1.8 (*)    Abs Immature Granulocytes 0.18 (*)    All other components within normal limits  LACTIC ACID, PLASMA - Abnormal; Notable for the following components:   Lactic Acid, Venous 2.2 (*)  All other components within normal limits  CBC - Abnormal; Notable for the  following components:   WBC 13.4 (*)    RBC 6.85 (*)    Hemoglobin 17.5 (*)    MCV 75.6 (*)    MCH 25.5 (*)    All other components within normal limits  BASIC METABOLIC PANEL - Abnormal; Notable for the following components:   Sodium 124 (*)    Chloride 94 (*)    CO2 20 (*)    Glucose, Bld 114 (*)    BUN 24 (*)    Creatinine, Ser 0.58 (*)    Calcium 8.5 (*)    All other components within normal limits  POC OCCULT BLOOD, ED - Abnormal; Notable for the following components:   Fecal Occult Bld POSITIVE (*)    All other components within normal limits  URINE CULTURE  LACTIC ACID, PLASMA  LIPASE, BLOOD  HIV ANTIBODY (ROUTINE TESTING W REFLEX)  CREATININE, SERUM  CBC    EKG None  Radiology CT Head Wo Contrast  Result Date: 09/22/2021 CLINICAL DATA:  Intracranial shunt placement EXAM: CT HEAD WITHOUT CONTRAST TECHNIQUE: Contiguous axial images were obtained from the base of the skull through the vertex without intravenous contrast. RADIATION DOSE REDUCTION: This exam was performed according to the departmental dose-optimization program which includes automated exposure control, adjustment of the mA and/or kV according to patient size and/or use of iterative reconstruction technique. COMPARISON:  Head CT dated Jan 12, 2021 FINDINGS: Brain: Unchanged position of right posterior shunt catheter with tip terminating at the frontal horn of the left lateral ventricle. Stable ventricular size and configuration. Chronic white matter ischemic change. 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 No evidence of intracranial hemorrhage. Vascular: No hyperdense vessel or unexpected calcification. Skull: Normal. Negative for fracture or focal lesion. Sinuses/Orbits: No acute finding. Other: None. IMPRESSION: 1. Stable position of shunt catheter with stable ventricular size. 2. No acute intracranial abnormality. Electronically Signed   By: Yetta Glassman M.D.   On: 09/22/2021 15:42     Procedures .Critical Care Performed by: Gareth Morgan, MD Authorized by: Gareth Morgan, MD   Critical care provider statement:    Critical care time (minutes):  30   Critical care was time spent personally by me on the following activities:  Development of treatment plan with patient or surrogate, examination of patient, ordering and review of laboratory studies, ordering and review of radiographic studies, ordering and performing treatments and interventions, pulse oximetry and review of old charts    Medications Ordered in ED Medications  remdesivir 100 mg in sodium chloride 0.9 % 100 mL IVPB (100 mg Intravenous New Bag/Given 09/23/21 0924)  heparin injection 5,000 Units (5,000 Units Subcutaneous Given 09/23/21 0611)  0.9 %  sodium chloride infusion ( Intravenous New Bag/Given 09/23/21 0655)  sodium chloride 0.9 % bolus 1,000 mL (0 mLs Intravenous Stopped 09/22/21 1821)  ondansetron (ZOFRAN) injection 4 mg (4 mg Intravenous Given 09/22/21 1641)  dicyclomine (BENTYL) injection 20 mg (20 mg Intramuscular Given 09/22/21 1641)  remdesivir 200 mg in sodium chloride 0.9% 250 mL IVPB (0 mg Intravenous Stopped 09/22/21 1820)  ondansetron (ZOFRAN) injection 4 mg (4 mg Intravenous Given 09/23/21 0405)    ED Course/ Medical Decision Making/ A&P                           Medical Decision Making    41 year old male with a history of congenital paraplegia, current malformation,  hypertension, ileostomy, spina bifida with hydrocephalus and VP shunt in place, who presents with concern for congestion, abdominal pain, headache, diarrhea.  DDx for headache includes shunt malfunction/obstruction/infection. CT head ordered and evaluated/interpreted by me shows no evidence of complications, no acute abnormalities.   DDx for abdominal pain includes pancreatitis, SBO, PUD, nephrolithiasis, pyelonephritis, gastroenteritis.  Reports change in stool color-darker color stool--CBC shows elevated hgb likely  hemoconcentration. Hemoccult positive but have low suspicion for significant acute GI bleed and will continue to monitor.   Given multiple sick contacts with similar symptoms, diarrhea, more liquid output, nonfocal cramping pain with continued output, labs reviewed have low suspicion for choledocolithiasis, cholangitis/pancreatitis.  Suspect abdominal pain and symptoms have decreased appetite, nausea, and congestion, headache secondary to viral syndrome sure by his family members.  Due to his decreased appetite, his labs and vital signs show signs of dehydration.  His sodium which was 134 in November, and is now 120, and his creatinine which was 0.45 previously is now 1.44.  He has an anion gap metabolic acidosis with a anion gap of 20, bicarb of 16.  His lactic acid is within normal limits, he has no signs of diabetic ketoacidosis, or history to suggest alcoholic ketoacidosis, salicylate overdose, or toxic alcohol ingestion.  Suspect his anion gap acidosis likely secondary to renal failure.  His COVID-19 test returned positive.  Ordered IV fluids, remdesivir, will admit to the hospitalist for continued care of significant AKI, metabolic acidosis and symptomatic hyponatremia.    Labs and imaging personally reviewed as above.            Final Clinical Impression(s) / ED Diagnoses Final diagnoses:  Metabolic acidosis  AKI (acute kidney injury) ([place])  Dehydration  Hyponatremia  COVID-19    Rx / DC Orders ED Discharge Orders     None         Gareth Morgan, MD 09/23/21 1009

## 2021-09-22 NOTE — ED Triage Notes (Signed)
Pt arrived via POV, c/o congestions, abd pain, headache, dirrhea. Family members with same sx. Has not been taking much POs

## 2021-09-23 DIAGNOSIS — E869 Volume depletion, unspecified: Secondary | ICD-10-CM | POA: Diagnosis not present

## 2021-09-23 DIAGNOSIS — G8220 Paraplegia, unspecified: Secondary | ICD-10-CM | POA: Diagnosis not present

## 2021-09-23 DIAGNOSIS — E860 Dehydration: Secondary | ICD-10-CM | POA: Diagnosis present

## 2021-09-23 DIAGNOSIS — Z9359 Other cystostomy status: Secondary | ICD-10-CM | POA: Diagnosis not present

## 2021-09-23 DIAGNOSIS — U071 COVID-19: Secondary | ICD-10-CM | POA: Diagnosis present

## 2021-09-23 DIAGNOSIS — Z932 Ileostomy status: Secondary | ICD-10-CM | POA: Diagnosis not present

## 2021-09-23 DIAGNOSIS — Q0703 Arnold-Chiari syndrome with spina bifida and hydrocephalus: Secondary | ICD-10-CM | POA: Diagnosis not present

## 2021-09-23 DIAGNOSIS — N179 Acute kidney failure, unspecified: Secondary | ICD-10-CM | POA: Diagnosis present

## 2021-09-23 DIAGNOSIS — E871 Hypo-osmolality and hyponatremia: Secondary | ICD-10-CM | POA: Diagnosis present

## 2021-09-23 DIAGNOSIS — R8290 Unspecified abnormal findings in urine: Secondary | ICD-10-CM | POA: Diagnosis present

## 2021-09-23 DIAGNOSIS — Z982 Presence of cerebrospinal fluid drainage device: Secondary | ICD-10-CM | POA: Diagnosis not present

## 2021-09-23 DIAGNOSIS — I10 Essential (primary) hypertension: Secondary | ICD-10-CM | POA: Diagnosis present

## 2021-09-23 DIAGNOSIS — Z91040 Latex allergy status: Secondary | ICD-10-CM | POA: Diagnosis not present

## 2021-09-23 DIAGNOSIS — G808 Other cerebral palsy: Secondary | ICD-10-CM | POA: Diagnosis present

## 2021-09-23 DIAGNOSIS — D72829 Elevated white blood cell count, unspecified: Secondary | ICD-10-CM | POA: Diagnosis present

## 2021-09-23 DIAGNOSIS — E872 Acidosis, unspecified: Secondary | ICD-10-CM | POA: Diagnosis present

## 2021-09-23 DIAGNOSIS — Z888 Allergy status to other drugs, medicaments and biological substances status: Secondary | ICD-10-CM | POA: Diagnosis not present

## 2021-09-23 DIAGNOSIS — E861 Hypovolemia: Secondary | ICD-10-CM | POA: Diagnosis present

## 2021-09-23 DIAGNOSIS — Z881 Allergy status to other antibiotic agents status: Secondary | ICD-10-CM | POA: Diagnosis not present

## 2021-09-23 DIAGNOSIS — Z993 Dependence on wheelchair: Secondary | ICD-10-CM | POA: Diagnosis not present

## 2021-09-23 LAB — CBC
HCT: 51.8 % (ref 39.0–52.0)
Hemoglobin: 17.5 g/dL — ABNORMAL HIGH (ref 13.0–17.0)
MCH: 25.5 pg — ABNORMAL LOW (ref 26.0–34.0)
MCHC: 33.8 g/dL (ref 30.0–36.0)
MCV: 75.6 fL — ABNORMAL LOW (ref 80.0–100.0)
Platelets: 327 10*3/uL (ref 150–400)
RBC: 6.85 MIL/uL — ABNORMAL HIGH (ref 4.22–5.81)
RDW: 14.8 % (ref 11.5–15.5)
WBC: 13.4 10*3/uL — ABNORMAL HIGH (ref 4.0–10.5)
nRBC: 0 % (ref 0.0–0.2)

## 2021-09-23 LAB — BASIC METABOLIC PANEL
Anion gap: 10 (ref 5–15)
BUN: 24 mg/dL — ABNORMAL HIGH (ref 6–20)
CO2: 20 mmol/L — ABNORMAL LOW (ref 22–32)
Calcium: 8.5 mg/dL — ABNORMAL LOW (ref 8.9–10.3)
Chloride: 94 mmol/L — ABNORMAL LOW (ref 98–111)
Creatinine, Ser: 0.58 mg/dL — ABNORMAL LOW (ref 0.61–1.24)
GFR, Estimated: >60 mL/min (ref >60)
Glucose, Bld: 114 mg/dL — ABNORMAL HIGH (ref 70–99)
Potassium: 5 mmol/L (ref 3.5–5.1)
Sodium: 124 mmol/L — ABNORMAL LOW (ref 135–145)

## 2021-09-23 LAB — URINALYSIS, ROUTINE W REFLEX MICROSCOPIC
Bilirubin Urine: NEGATIVE
Glucose, UA: NEGATIVE mg/dL
Ketones, ur: 5 mg/dL — AB
Nitrite: POSITIVE — AB
Protein, ur: 30 mg/dL — AB
Specific Gravity, Urine: 1.014 (ref 1.005–1.030)
WBC, UA: >50 WBC/hpf — ABNORMAL HIGH (ref 0–5)
pH: 6 (ref 5.0–8.0)

## 2021-09-23 LAB — HIV ANTIBODY (ROUTINE TESTING W REFLEX): HIV Screen 4th Generation wRfx: NONREACTIVE

## 2021-09-23 MED ORDER — ONDANSETRON HCL 4 MG/2ML IJ SOLN
4.0000 mg | Freq: Four times a day (QID) | INTRAMUSCULAR | Status: DC | PRN
  Administered 2021-09-23: 4 mg via INTRAVENOUS
  Filled 2021-09-23: qty 2

## 2021-09-23 MED ORDER — CHLORHEXIDINE GLUCONATE CLOTH 2 % EX PADS
6.0000 | MEDICATED_PAD | Freq: Every day | CUTANEOUS | Status: DC
  Administered 2021-09-23 – 2021-09-24 (×2): 6 via TOPICAL

## 2021-09-23 MED ORDER — ACETAMINOPHEN 500 MG PO TABS: 1000.0000 mg | ORAL_TABLET | Freq: Three times a day (TID) | ORAL | Status: DC | PRN

## 2021-09-23 MED ORDER — METOPROLOL TARTRATE 50 MG PO TABS
50.0000 mg | ORAL_TABLET | Freq: Two times a day (BID) | ORAL | Status: DC
  Administered 2021-09-23 – 2021-09-24 (×2): 50 mg via ORAL
  Filled 2021-09-23 (×2): qty 1

## 2021-09-23 MED ORDER — PANTOPRAZOLE SODIUM 40 MG PO TBEC
40.0000 mg | DELAYED_RELEASE_TABLET | Freq: Two times a day (BID) | ORAL | Status: DC
  Administered 2021-09-23 – 2021-09-24 (×3): 40 mg via ORAL
  Filled 2021-09-23 (×3): qty 1

## 2021-09-23 MED ORDER — VERAPAMIL HCL ER 120 MG PO TBCR
120.0000 mg | EXTENDED_RELEASE_TABLET | Freq: Every day | ORAL | Status: DC
  Administered 2021-09-23: 120 mg via ORAL
  Filled 2021-09-23: qty 1

## 2021-09-23 MED ORDER — ONDANSETRON HCL 4 MG/2ML IJ SOLN
4.0000 mg | Freq: Once | INTRAMUSCULAR | Status: AC
  Administered 2021-09-23: 4 mg via INTRAVENOUS
  Filled 2021-09-23: qty 2

## 2021-09-23 MED ORDER — ONDANSETRON 4 MG PO TBDP: 4.0000 mg | ORAL_TABLET | Freq: Three times a day (TID) | ORAL | Status: DC | PRN

## 2021-09-23 MED ORDER — ADULT MULTIVITAMIN W/MINERALS CH
1.0000 | ORAL_TABLET | Freq: Every day | ORAL | Status: DC
  Administered 2021-09-23: 1 via ORAL
  Filled 2021-09-23: qty 1

## 2021-09-23 MED ORDER — OXYBUTYNIN CHLORIDE 5 MG PO TABS
5.0000 mg | ORAL_TABLET | Freq: Three times a day (TID) | ORAL | Status: DC
  Administered 2021-09-23 – 2021-09-24 (×4): 5 mg via ORAL
  Filled 2021-09-23 (×4): qty 1

## 2021-09-23 NOTE — Progress Notes (Addendum)
PROGRESS NOTE  [name] AVW:[id number] DOB: [birth year] DOA: 09/22/2021 PCP: Buzzy Han, MD   LOS: 0 days   Brief narrative:   [name] is a 41 y.o. male with past medical history of congenital paraplegia, hypertension, ileostomy, spina bifida and hydrocephalus status post VP shunt presented to hospital with 1 week worsening oral intake.  Most of the family had some kind of GI bug on 09/16/2021 with some nausea loose stool and generally poor oral intake.  Family noted poor oral intake with decreased urinary output and thought to have dehydration so patient was brought into the hospital.  In the ED patient was treated with IV fluids for dehydration and received 1 dose of remdesivir for positive COVID test.  Patient however did not have any respiratory symptoms.  He was then admitted hospital for further treatment  Assessment/Plan:  Principal Problem:   COVID-19 virus infection Active Problems:   COVID  AKI Due to poor oral intake and volume depletion.  Has improved at this time.  Continue IV fluids.  Creatinine has improved at this time.  Anion gap metabolic acidosis, lactic acidosis [place] secondary to poor oral intake.  Continue IV fluids.  Improving.   Acute Covid viral infection, without hypoxia Predominantly GI symptoms with malaise and poor oral intake.  No indication for remdesivir or steroids at this time.  Continue supportive care.    Hypovolemic Hyponatremia Sodium level of 124 from 120 on presentation.  Continue to monitor.  Continue replacement  History of congenital paraplegia, spina bifida with hydrocephalus status post VP shunt.  Patient lives with his mother at home.  Status post ileostomy.  FOBT was positive.  We will continue to monitor closely.  No gross blood in the stool  Leukocytosis.  Likely reactive.  Has trended down.   ' Elevated hemoglobin level.  FOBT positive.  Likely secondary to hemoconcentration and dehydration.  Continue to  monitor closely.  Abnormal urinalysis.  Patient does have chronic indwelling suprapubic catheter.  Patient is currently asymptomatic from it.  Hypertension.  Patient is on metoprolol and verapamil at home.  Hold metoprolol.  Resume verapamil starting tonight.  DVT prophylaxis: heparin injection 5,000 Units Start: 09/22/21 2200   Code Status: Full code  Family Communication: Spoke with the patient's mother at bedside.  Status is: Inpatient  Remains inpatient appropriate because: Need for IV fluid hydration, further monitoring   Consultants: None  Procedures: None  Anti-infectives:  None  Anti-infectives (From admission, onward)    Start     Dose/Rate Route Frequency Ordered Stop   09/23/21 1000  remdesivir 100 mg in sodium chloride 0.9 % 100 mL IVPB  Status:  Discontinued        100 mg 200 mL/hr over 30 Minutes Intravenous Daily 09/22/21 1644 09/23/21 1056   09/22/21 1730  remdesivir 200 mg in sodium chloride 0.9% 250 mL IVPB        200 mg 580 mL/hr over 30 Minutes Intravenous Once 09/22/21 1643 09/22/21 1820      Subjective: Today, patient was seen and examined at bedside.  Has decreased oral intake.  No vomiting reported this morning.  Patient's mother at bedside  Objective: Vitals:   09/23/21 0700 09/23/21 0920  BP: 125/85 117/61  Pulse: (!) 115 (!) 114  Resp: 19 18  Temp:    SpO2: 96% 99%   No intake or output data in the 24 hours ending 09/23/21 1056 There were no vitals filed for this visit. There is no  height or weight on file to calculate BMI.   Physical Exam: GENERAL: Patient is alert awake and communicative not in obvious distress. HENT: No scleral pallor or icterus. Pupils equally reactive to light. Oral mucosa is moist NECK: is supple, no gross swelling noted. CHEST: Clear to auscultation. No crackles or wheezes.  Diminished breath sounds bilaterally. CVS: S1 and S2 heard, no murmur. Regular rate and rhythm.  ABDOMEN: Soft, non-tender, bowel  sounds are present.  Ileostomy with some output.  Suprapubic catheter in place. EXTREMITIES: No edema.  Weakness of the lower extremities. CNS: Awake and communicative.  Weaknesses of the lower extremity. SKIN: warm and dry   Data Review: I have personally reviewed the following laboratory data and studies,  CBC: Recent Labs  Lab 09/22/21 1518 09/23/21 0614  WBC 16.7* 13.4*  NEUTROABS 12.5*  --   HGB 19.5* 17.5*  HCT 58.8* 51.8  MCV 75.6* 75.6*  PLT 343 818   Basic Metabolic Panel: Recent Labs  Lab 09/22/21 1518 09/22/21 2051 09/23/21 0614  NA 120*  --  124*  K 5.1  --  5.0  CL 84*  --  94*  CO2 16*  --  20*  GLUCOSE 135*  --  114*  BUN 49*  --  24*  CREATININE 1.44* 0.87 0.58*  CALCIUM 9.4  --  8.5*   Liver Function Tests: Recent Labs  Lab 09/22/21 1518  AST 29  ALT 71*  ALKPHOS 146*  BILITOT 1.0  PROT 8.9*  ALBUMIN 4.2   Recent Labs  Lab 09/22/21 1518  LIPASE 21   No results for input(s): AMMONIA in the last 168 hours. Cardiac Enzymes: No results for input(s): CKTOTAL, CKMB, CKMBINDEX, TROPONINI in the last 168 hours. BNP (last 3 results) No results for input(s): BNP in the last 8760 hours.  ProBNP (last 3 results) No results for input(s): PROBNP in the last 8760 hours.  CBG: No results for input(s): GLUCAP in the last 168 hours. Recent Results (from the past 240 hour(s))  Resp Panel by RT-PCR (Flu A&B, Covid) Nasopharyngeal Swab     Status: Abnormal   Collection Time: 09/22/21  3:18 PM   Specimen: Nasopharyngeal Swab; Nasopharyngeal(NP) swabs in vial transport medium  Result Value Ref Range Status   SARS Coronavirus 2 by RT PCR POSITIVE (A) NEGATIVE Final    Comment: (NOTE) SARS-CoV-2 target nucleic acids are DETECTED.  The SARS-CoV-2 RNA is generally detectable in upper respiratory specimens during the acute phase of infection. Positive results are indicative of the presence of the identified virus, but do not rule out bacterial infection  or co-infection with other pathogens not detected by the test. Clinical correlation with patient history and other diagnostic information is necessary to determine patient infection status. The expected result is Negative.  Fact Sheet for Patients: EntrepreneurPulse.com.au  Fact Sheet for Healthcare Providers: IncredibleEmployment.be  This test is not yet approved or cleared by the [place] FDA and  has been authorized for detection and/or diagnosis of SARS-CoV-2 by FDA under an Emergency Use Authorization (EUA).  This EUA will remain in effect (meaning this test can be used) for the duration of  the COVID-19 declaration under Section 564(b)(1) of the A ct, 21 U.S.C. section 360bbb-3(b)(1), unless the authorization is terminated or revoked sooner.     Influenza A by PCR NEGATIVE NEGATIVE Final   Influenza B by PCR NEGATIVE NEGATIVE Final    Comment: (NOTE) The Xpert Xpress SARS-CoV-2/FLU/RSV plus assay is intended as an aid in the  diagnosis of influenza from Nasopharyngeal swab specimens and should not be used as a sole basis for treatment. Nasal washings and aspirates are unacceptable for Xpert Xpress SARS-CoV-2/FLU/RSV testing.  Fact Sheet for Patients: EntrepreneurPulse.com.au  Fact Sheet for Healthcare Providers: IncredibleEmployment.be  This test is not yet approved or cleared by the [place] FDA and has been authorized for detection and/or diagnosis of SARS-CoV-2 by FDA under an Emergency Use Authorization (EUA). This EUA will remain in effect (meaning this test can be used) for the duration of the COVID-19 declaration under Section 564(b)(1) of the Act, 21 U.S.C. section 360bbb-3(b)(1), unless the authorization is terminated or revoked.  Performed at [facility], [place] [address] [place], [place] [address]      Studies: CT Head Wo Contrast  Result Date:  09/22/2021 CLINICAL DATA:  Intracranial shunt placement EXAM: CT HEAD WITHOUT CONTRAST TECHNIQUE: Contiguous axial images were obtained from the base of the skull through the vertex without intravenous contrast. RADIATION DOSE REDUCTION: This exam was performed according to the departmental dose-optimization program which includes automated exposure control, adjustment of the mA and/or kV according to patient size and/or use of iterative reconstruction technique. COMPARISON:  Head CT dated Jan 12, 2021 FINDINGS: Brain: Unchanged position of right posterior shunt catheter with tip terminating at the frontal horn of the left lateral ventricle. Stable ventricular size and configuration. Chronic white matter ischemic change. 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 No evidence of intracranial hemorrhage. Vascular: No hyperdense vessel or unexpected calcification. Skull: Normal. Negative for fracture or focal lesion. Sinuses/Orbits: No acute finding. Other: None. IMPRESSION: 1. Stable position of shunt catheter with stable ventricular size. 2. No acute intracranial abnormality. Electronically Signed   By: Yetta Glassman M.D.   On: 09/22/2021 15:42      Flora Lipps, MD  [facility] 09/23/2021  If 7PM-7AM, please contact night-coverage

## 2021-09-23 NOTE — Progress Notes (Signed)
°   09/23/21 1502  Assess: MEWS Score  Temp 98.3 F (36.8 C)  BP 119/79  Pulse Rate (!) 116 (Jancie Kercher RN notified)  Resp 20  SpO2 100 %  Assess: MEWS Score  MEWS Temp 0  MEWS Systolic 0  MEWS Pulse 2  MEWS RR 0  MEWS LOC 0  MEWS Score 2  MEWS Score Color Yellow  Assess: if the MEWS score is Yellow or Red  Were vital signs taken at a resting state? Yes  Focused Assessment No change from prior assessment  Does the patient meet 2 or more of the SIRS criteria? No  MEWS guidelines implemented *See Row Information* Yes  Treat  MEWS Interventions Escalated (See documentation below) (Yellow MEWS)  Pain Scale 0-10  Pain Score 0  Take Vital Signs  Increase Vital Sign Frequency  Yellow: Q 2hr X 2 then Q 4hr X 2, if remains yellow, continue Q 4hrs  Escalate  MEWS: Escalate Yellow: discuss with charge nurse/RN and consider discussing with provider and RRT  Notify: Charge Nurse/RN  Name of Charge Nurse/RN Notified Harvest Dark, RN  Date Charge Nurse/RN Notified 09/23/21  Time Charge Nurse/RN Notified 65  Notify: Provider  Provider Name/Title Pokhrel, MD  Date Provider Notified 09/23/21  Time Provider Notified 1520  Notification Type Page  Notification Reason Other (Comment) (Yellow MEWS implemented per protocol)  Provider response No new orders  Date of Provider Response  (Awaiting response)  Time of Provider Response  (Awaiting response)  Document  Patient Outcome Other (Comment) (New admit. Pt stable. No new orders recieved.)  Progress note created (see row info) Yes  Assess: SIRS CRITERIA  SIRS Temperature  0  SIRS Pulse 1  SIRS Respirations  0  SIRS WBC 0  SIRS Score Sum  1   New admission from ED. Pt Aox4. Pt mother at bedside during transfer. Pt in yellow MEWS. Yellow MEWS protocol implemented. Charge RN Jarrett Soho) notified. MD Pokhrel notified. Awaiting response from MD.

## 2021-09-24 ENCOUNTER — Other Ambulatory Visit: Payer: Self-pay

## 2021-09-24 LAB — CBC
HCT: 45.7 % (ref 39.0–52.0)
Hemoglobin: 14.8 g/dL (ref 13.0–17.0)
MCH: 25.3 pg — ABNORMAL LOW (ref 26.0–34.0)
MCHC: 32.4 g/dL (ref 30.0–36.0)
MCV: 78.3 fL — ABNORMAL LOW (ref 80.0–100.0)
Platelets: 250 10*3/uL (ref 150–400)
RBC: 5.84 MIL/uL — ABNORMAL HIGH (ref 4.22–5.81)
RDW: 14.6 % (ref 11.5–15.5)
WBC: 10.9 10*3/uL — ABNORMAL HIGH (ref 4.0–10.5)
nRBC: 0 % (ref 0.0–0.2)

## 2021-09-24 LAB — COMPREHENSIVE METABOLIC PANEL
ALT: 45 U/L — ABNORMAL HIGH (ref 0–44)
AST: 25 U/L (ref 15–41)
Albumin: 3 g/dL — ABNORMAL LOW (ref 3.5–5.0)
Alkaline Phosphatase: 89 U/L (ref 38–126)
Anion gap: 13 (ref 5–15)
BUN: 12 mg/dL (ref 6–20)
CO2: 21 mmol/L — ABNORMAL LOW (ref 22–32)
Calcium: 8.4 mg/dL — ABNORMAL LOW (ref 8.9–10.3)
Chloride: 95 mmol/L — ABNORMAL LOW (ref 98–111)
Creatinine, Ser: <0.3 mg/dL — ABNORMAL LOW (ref 0.61–1.24)
Glucose, Bld: 82 mg/dL (ref 70–99)
Potassium: 4.4 mmol/L (ref 3.5–5.1)
Sodium: 129 mmol/L — ABNORMAL LOW (ref 135–145)
Total Bilirubin: 0.9 mg/dL (ref 0.3–1.2)
Total Protein: 6.2 g/dL — ABNORMAL LOW (ref 6.5–8.1)

## 2021-09-24 LAB — MAGNESIUM: Magnesium: 1.9 mg/dL (ref 1.7–2.4)

## 2021-09-24 MED ORDER — PANTOPRAZOLE SODIUM 40 MG PO TBEC: 40.0000 mg | DELAYED_RELEASE_TABLET | Freq: Every day | ORAL | 0 refills | Status: DC

## 2021-09-24 MED ORDER — ENSURE ENLIVE PO LIQD
237.0000 mL | Freq: Two times a day (BID) | ORAL | Status: DC
  Administered 2021-09-24: 237 mL via ORAL

## 2021-09-24 MED ORDER — ENSURE ENLIVE PO LIQD: 237.0000 mL | Freq: Two times a day (BID) | ORAL | 0 refills | Status: AC

## 2021-09-24 NOTE — TOC Initial Note (Signed)
Transition of Care [facility]) - Initial/Assessment Note    Patient Details  Name: [name] MRN: [id number] Date of Birth: Feb 13, 1981  [facility]) CM/SW Contact:    Leeroy Cha, RN Phone Number: 09/24/2021, 8:10 AM  Clinical Narrative:                  Transition of Care [facility]) Screening Note   Patient Details  Name: [name] Date of Birth: 04-Sep-1981   Transition of Care [facility]) CM/SW Contact:    Leeroy Cha, RN Phone Number: 09/24/2021, 8:11 AM    Transition of Care Department [facility]) has reviewed patient and no TOC needs have been identified at this time. We will continue to monitor patient advancement through interdisciplinary progression rounds. If new patient transition needs arise, please place a TOC consult.    Expected Discharge Plan: Home/Self Care Barriers to Discharge: Continued Medical Work up   Patient Goals and CMS Choice Patient states their goals for this hospitalization and ongoing recovery are:: to go home CMS Medicare.gov Compare Post Acute Care list provided to:: Patient    Expected Discharge Plan and Services Expected Discharge Plan: Home/Self Care   Discharge Planning Services: CM Consult   Living arrangements for the past 2 months: Single Family Home                                      Prior Living Arrangements/Services Living arrangements for the past 2 months: Single Family Home Lives with:: Self, Parents Patient language and need for interpreter reviewed:: Yes Do you feel safe going back to the place where you live?: Yes      Need for Family Participation in Patient Care: Yes (Comment) Care giver support system in place?: Yes (comment)   Criminal Activity/Legal Involvement Pertinent to Current Situation/Hospitalization: No - Comment as needed  Activities of Daily Living Home Assistive Devices/Equipment: Wheelchair, Eyeglasses (bp cuff, pulse oximeter per previous hx) ADL Screening (condition  at time of admission) Patient's cognitive ability adequate to safely complete daily activities?: Yes Is the patient deaf or have difficulty hearing?: No Does the patient have difficulty seeing, even when wearing glasses/contacts?: No Does the patient have difficulty concentrating, remembering, or making decisions?: No Patient able to express need for assistance with ADLs?: Yes Does the patient have difficulty dressing or bathing?: Yes Independently performs ADLs?: No Communication: Independent Dressing (OT): Needs assistance Is this a change from baseline?: Pre-admission baseline Grooming: Needs assistance Is this a change from baseline?: Pre-admission baseline Feeding: Needs assistance Is this a change from baseline?: Pre-admission baseline Bathing: Needs assistance Is this a change from baseline?: Pre-admission baseline Toileting: Needs assistance Is this a change from baseline?: Pre-admission baseline In/Out Bed: Needs assistance Is this a change from baseline?: Pre-admission baseline Walks in Home: Needs assistance Is this a change from baseline?: Pre-admission baseline Does the patient have difficulty walking or climbing stairs?: Yes Weakness of Legs: Both (spina bifida) Weakness of Arms/Hands: Both  Permission Sought/Granted                  Emotional Assessment Appearance:: Appears stated age Attitude/Demeanor/Rapport: Engaged Affect (typically observed): Appropriate Orientation: : Oriented to Self, Oriented to Place, Oriented to  Time, Oriented to Situation Alcohol / Substance Use: Not Applicable Psych Involvement: No (comment)  Admission diagnosis:  Dehydration [E86.0] Hyponatremia [O83.2] Metabolic acidosis [P49.82] AKI (acute kidney injury) ([place]) [N17.9] COVID [  U07.1] COVID-19 virus infection [U07.1] COVID-19 [U07.1] Patient Active Problem List   Diagnosis Date Noted   COVID 09/23/2021   COVID-19 virus infection 09/22/2021   Essential hypertension  12/23/2015   Hypotension 12/23/2015   Decubitus ulcer of buttock, stage 3 ([place]) 34/91/7915   Complicated UTI (urinary tract infection)    Sepsis secondary to UTI ([place]) 11/19/2015   Sepsis ([place]) 07/22/2015   AKI (acute kidney injury) ([place]) 07/22/2015   Hyperkalemia 07/22/2015   Hyponatremia 07/22/2015   Thrombocytosis 07/22/2015   Leukocytosis 07/22/2015   Borderline diabetes 07/22/2015   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/22/2015   Urinary tract infectious disease    Headache 11/06/2014   H/O ventricular shunt    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   Severe sepsis ([place]) 10/26/2014   Renal failure (ARF), acute on chronic (HCC) 10/26/2014   Severe sepsis with septic shock ([place]) 10/26/2014   UTI (lower urinary tract infection)    Anemia in chronic renal disease 09/30/2013   Esophageal reflux 09/17/2013   Protein-calorie malnutrition, severe ([place]) 09/17/2013   Essential hypertension, benign 09/17/2013   Anemia of other chronic disease 09/17/2013   Septic shock ([place]) 07/09/2013   Spina bifida ([place]) 07/07/2013   Neurogenic bladder 07/07/2013   Elevated LFTs 07/07/2013   PCP:  Buzzy Han, MD Pharmacy:   [id number] - [place], [place] [address] Phone: [phone] Fax: [phone]     Social Determinants of Health (SDOH) Interventions    Readmission Risk Interventions No flowsheet data found.

## 2021-09-24 NOTE — Plan of Care (Addendum)
No acute events overnight. Pt states he would like to eat this morning. Problem: Education: Goal: Knowledge of General Education information will improve Description: Including pain rating scale, medication(s)/side effects and non-pharmacologic comfort measures Outcome: Progressing   Problem: Health Behavior/Discharge Planning: Goal: Ability to manage health-related needs will improve Outcome: Progressing   Problem: Clinical Measurements: Goal: Ability to maintain clinical measurements within normal limits will improve Outcome: Progressing Goal: Will remain free from infection Outcome: Progressing Goal: Diagnostic test results will improve Outcome: Progressing Goal: Respiratory complications will improve Outcome: Progressing Goal: Cardiovascular complication will be avoided Outcome: Progressing   Problem: Activity: Goal: Risk for activity intolerance will decrease Outcome: Progressing   Problem: Nutrition: Goal: Adequate nutrition will be maintained Outcome: Progressing   Problem: Coping: Goal: Level of anxiety will decrease Outcome: Progressing   Problem: Elimination: Goal: Will not experience complications related to bowel motility Outcome: Progressing Goal: Will not experience complications related to urinary retention Outcome: Progressing   Problem: Pain Managment: Goal: General experience of comfort will improve Outcome: Progressing   Problem: Safety: Goal: Ability to remain free from injury will improve Outcome: Progressing   Problem: Skin Integrity: Goal: Risk for impaired skin integrity will decrease Outcome: Progressing   Problem: Education: Goal: Knowledge of risk factors and measures for prevention of condition will improve Outcome: Progressing   Problem: Coping: Goal: Psychosocial and spiritual needs will be supported Outcome: Progressing   Problem: Respiratory: Goal: Will maintain a patent airway Outcome: Progressing Goal: Complications  related to the disease process, condition or treatment will be avoided or minimized Outcome: Progressing

## 2021-09-24 NOTE — Discharge Summary (Signed)
Physician Discharge Summary  [name] ZGY:[id number] DOB: 02/24/1981 DOA: 09/22/2021  PCP: Buzzy Han, MD  Admit date: 09/22/2021 Discharge date: 09/24/2021  Admitted From: Home  Discharge disposition: Home  Recommendations for Outpatient Follow-Up:   Follow up with your primary care provider in one week.  Check CBC, BMP, magnesium in the next visit  Discharge Diagnosis:   Principal Problem:   COVID-19 virus infection Active Problems:   COVID  Discharge Condition: Improved.  Diet recommendation:   Regular.  Wound care: None.  Code status: Full.   History of Present Illness:   [name] is a 41 y.o. male with past medical history of congenital paraplegia, hypertension, ileostomy, spina bifida and hydrocephalus status post VP shunt presented to hospital with 1 week worsening oral intake.  Most of the family had some kind of GI bug on 09/16/2021 with some nausea loose stool and generally poor oral intake.  Family noted poor oral intake with decreased urinary output and thought to have dehydration so patient was brought into the hospital.  In the ED patient was treated with IV fluids for dehydration and received 1 dose of remdesivir for positive COVID test.  Patient however did not have any respiratory symptoms.  He was then admitted hospital for further treatment  Hospital Course:   Following conditions were addressed during hospitalization as listed below,  AKI-resolved Due to poor oral intake and volume depletion.  Received IV fluids.  Creatinine has improved at this time.  Creatinine less than 0.3 at this time.   Anion gap metabolic acidosis, lactic acidosis Improved after IV fluids.   Acute Covid viral infection, without hypoxia Predominantly GI symptoms with malaise and poor oral intake on admission.  Patient has not had any nausea vomiting or diarrhea.  Has been eating better.  No indication for remdesivir or steroids.  Continue supportive  care.  Courage oral hydration at home.   Hypovolemic Hyponatremia Sodium level of 129 from 120 on presentation.  Continue to monitor.  Regular diet on discharge.  Encouraged hydration.  History of congenital paraplegia, spina bifida with hydrocephalus status post VP shunt.  Patient lives with his mother at home.  Status post ileostomy in place.Marland Kitchen  FOBT was positive.  We will continue to monitor closely.  No gross blood in the stool.  Hemoglobin prior to discharge was 14.8.   Leukocytosis.  Likely reactive.  Trended down. ' Elevated hemoglobin level.  FOBT positive.  Likely secondary to hemoconcentration and dehydration.  Hemoglobin prior to discharge was 14.8.   Abnormal urinalysis.  Patient does have chronic indwelling suprapubic catheter.  Patient is currently asymptomatic from it.   Hypertension.  Patient is on metoprolol and verapamil at home.  Resume on discharge.  Disposition.  At this time, patient is stable for disposition home with outpatient PCP follow-up.  Spoke with the patient's mother at bedside.  Medical Consultants:   None.  Procedures:    None Subjective:   Today, patient was seen and examined at bedside.  Patient feels better today.  No nausea vomiting abdominal pain.  Was able to eat better.  No diarrhea.  Discharge Exam:   Vitals:   09/23/21 2314 09/24/21 0310  BP: 95/62 126/72  Pulse: (!) 101 97  Resp: 17 17  Temp: 98.3 F (36.8 C) 98.7 F (37.1 C)  SpO2: 98% 98%   Vitals:   09/23/21 1729 09/23/21 1900 09/23/21 2314 09/24/21 0310  BP: 105/68 (!) 122/58 95/62 126/72  Pulse: 91 88 (!)  101 97  Resp:  20 17 17   Temp: 98.1 F (36.7 C) 98.3 F (36.8 C) 98.3 F (36.8 C) 98.7 F (37.1 C)  TempSrc: Oral  Oral Oral  SpO2: 100% 100% 98% 98%   General: Alert awake, not in obvious distress HENT: pupils equally reacting to light,  No scleral pallor or icterus noted. Oral mucosa is moist.  Chest:  Clear breath sounds.  Diminished breath sounds bilaterally.  No crackles or wheezes.  CVS: S1 &S2 heard. No murmur.  Regular rate and rhythm. Abdomen: Soft, nontender, nondistended.  Bowel sounds are heard.  Ileostomy in place with output. Extremities: No cyanosis, clubbing or edema.  Peripheral pulses are palpable.  Suprapubic catheter in place. Psych: Alert, awake and communicative.  CNS:  No cranial nerve deficits.  Paraplegia noted. Skin: Warm and dry.  No rashes noted.  The results of significant diagnostics from this hospitalization (including imaging, microbiology, ancillary and laboratory) are listed below for reference.     Diagnostic Studies:   CT Head Wo Contrast  Result Date: 09/22/2021 CLINICAL DATA:  Intracranial shunt placement EXAM: CT HEAD WITHOUT CONTRAST TECHNIQUE: Contiguous axial images were obtained from the base of the skull through the vertex without intravenous contrast. RADIATION DOSE REDUCTION: This exam was performed according to the departmental dose-optimization program which includes automated exposure control, adjustment of the mA and/or kV according to patient size and/or use of iterative reconstruction technique. COMPARISON:  Head CT dated Jan 12, 2021 FINDINGS: Brain: Unchanged position of right posterior shunt catheter with tip terminating at the frontal horn of the left lateral ventricle. Stable ventricular size and configuration. Chronic white matter ischemic change. 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 No evidence of intracranial hemorrhage. Vascular: No hyperdense vessel or unexpected calcification. Skull: Normal. Negative for fracture or focal lesion. Sinuses/Orbits: No acute finding. Other: None. IMPRESSION: 1. Stable position of shunt catheter with stable ventricular size. 2. No acute intracranial abnormality. Electronically Signed   By: Yetta Glassman M.D.   On: 09/22/2021 15:42     Labs:   Basic Metabolic Panel: Recent Labs  Lab 09/22/21 1518 09/22/21 2051 09/23/21 0614 09/24/21 0451   NA 120*  --  124* 129*  K 5.1  --  5.0 4.4  CL 84*  --  94* 95*  CO2 16*  --  20* 21*  GLUCOSE 135*  --  114* 82  BUN 49*  --  24* 12  CREATININE 1.44* 0.87 0.58* <0.30*  CALCIUM 9.4  --  8.5* 8.4*  MG  --   --   --  1.9   GFR CrCl cannot be calculated (This lab value cannot be used to calculate CrCl because it is not a number: <0.30). Liver Function Tests: Recent Labs  Lab 09/22/21 1518 09/24/21 0451  AST 29 25  ALT 71* 45*  ALKPHOS 146* 89  BILITOT 1.0 0.9  PROT 8.9* 6.2*  ALBUMIN 4.2 3.0*   Recent Labs  Lab 09/22/21 1518  LIPASE 21   No results for input(s): AMMONIA in the last 168 hours. Coagulation profile No results for input(s): INR, PROTIME in the last 168 hours.  CBC: Recent Labs  Lab 09/22/21 1518 09/23/21 0614 09/24/21 0451  WBC 16.7* 13.4* 10.9*  NEUTROABS 12.5*  --   --   HGB 19.5* 17.5* 14.8  HCT 58.8* 51.8 45.7  MCV 75.6* 75.6* 78.3*  PLT 343 327 250   Cardiac Enzymes: No results for input(s): CKTOTAL, CKMB, CKMBINDEX, TROPONINI in the last  168 hours. BNP: Invalid input(s): POCBNP CBG: No results for input(s): GLUCAP in the last 168 hours. D-Dimer No results for input(s): DDIMER in the last 72 hours. Hgb A1c No results for input(s): HGBA1C in the last 72 hours. Lipid Profile No results for input(s): CHOL, HDL, LDLCALC, TRIG, CHOLHDL, LDLDIRECT in the last 72 hours. Thyroid function studies No results for input(s): TSH, T4TOTAL, T3FREE, THYROIDAB in the last 72 hours.  Invalid input(s): FREET3 Anemia work up No results for input(s): VITAMINB12, FOLATE, FERRITIN, TIBC, IRON, RETICCTPCT in the last 72 hours. Microbiology Recent Results (from the past 240 hour(s))  Resp Panel by RT-PCR (Flu A&B, Covid) Nasopharyngeal Swab     Status: Abnormal   Collection Time: 09/22/21  3:18 PM   Specimen: Nasopharyngeal Swab; Nasopharyngeal(NP) swabs in vial transport medium  Result Value Ref Range Status   SARS Coronavirus 2 by RT PCR POSITIVE (A)  NEGATIVE Final    Comment: (NOTE) SARS-CoV-2 target nucleic acids are DETECTED.  The SARS-CoV-2 RNA is generally detectable in upper respiratory specimens during the acute phase of infection. Positive results are indicative of the presence of the identified virus, but do not rule out bacterial infection or co-infection with other pathogens not detected by the test. Clinical correlation with patient history and other diagnostic information is necessary to determine patient infection status. The expected result is Negative.  Fact Sheet for Patients: EntrepreneurPulse.com.au  Fact Sheet for Healthcare Providers: IncredibleEmployment.be  This test is not yet approved or cleared by the [place] FDA and  has been authorized for detection and/or diagnosis of SARS-CoV-2 by FDA under an Emergency Use Authorization (EUA).  This EUA will remain in effect (meaning this test can be used) for the duration of  the COVID-19 declaration under Section 564(b)(1) of the A ct, 21 U.S.C. section 360bbb-3(b)(1), unless the authorization is terminated or revoked sooner.     Influenza A by PCR NEGATIVE NEGATIVE Final   Influenza B by PCR NEGATIVE NEGATIVE Final    Comment: (NOTE) The Xpert Xpress SARS-CoV-2/FLU/RSV plus assay is intended as an aid in the diagnosis of influenza from Nasopharyngeal swab specimens and should not be used as a sole basis for treatment. Nasal washings and aspirates are unacceptable for Xpert Xpress SARS-CoV-2/FLU/RSV testing.  Fact Sheet for Patients: EntrepreneurPulse.com.au  Fact Sheet for Healthcare Providers: IncredibleEmployment.be  This test is not yet approved or cleared by the [place] FDA and has been authorized for detection and/or diagnosis of SARS-CoV-2 by FDA under an Emergency Use Authorization (EUA). This EUA will remain in effect (meaning this test can be used) for the  duration of the COVID-19 declaration under Section 564(b)(1) of the Act, 21 U.S.C. section 360bbb-3(b)(1), unless the authorization is terminated or revoked.  Performed at [facility], [place] [address] [place], [place] [address]   Urine Culture     Status: None (Preliminary result)   Collection Time: 09/23/21  4:00 AM   Specimen: Urine, Suprapubic  Result Value Ref Range Status   Specimen Description   Final    URINE, SUPRAPUBIC Performed at [facility] [address], [place], [place] [address]    Special Requests   Final    NONE Performed at [facility], [place] [address], [place], [place] [address]    Culture   Final    CULTURE REINCUBATED FOR BETTER GROWTH Performed at [facility], [place] [address] [place], [place] [address]    Report Status PENDING  Incomplete  Discharge Instructions:   Discharge Instructions     Call MD for:  persistant nausea and vomiting   Complete by: As directed    Call MD for:  severe uncontrolled pain   Complete by: As directed    Call MD for:  temperature >100.4   Complete by: As directed    Diet general   Complete by: As directed    Discharge instructions   Complete by: As directed    Follow-up with your primary care physician in 1 week.  Encourage oral hydration.  Check blood work at that time.  Seek medical attention for worsening symptoms.   Increase activity slowly   Complete by: As directed       Allergies as of 09/24/2021       Reactions   Latex Hives   Succinylcholine Rash   Other reaction(s): Other (See Comments) Pt is half [place] - prolonged paralysis due to compromised metabolism. Succinylcholine given on 07/07/15 at outside hospital and pt experienced paralysis > 3 hours.  Due to being native [place], his mother states that he takes longer to "come out of it"   Ciprofloxacin Other (See Comments)   IV only-- caused burning in arm    Vancomycin  Itching, Swelling   Adhesive [tape] Hives, Rash        Medication List     TAKE these medications    acetaminophen 650 MG CR tablet Commonly known as: TYLENOL Take 1,300 mg by mouth every 8 (eight) hours as needed for pain.   Calcium Antacid Ultra Strength 400 MG chewable tablet Generic drug: calcium elemental as carbonate Chew 1,000 mg by mouth 3 (three) times daily.   calcium-vitamin D 500-200 MG-UNIT tablet Commonly known as: OSCAL WITH D Take 1 tablet by mouth daily in the afternoon.   Cranberry-Vitamin C-Vitamin E 4200-20-3 MG-MG-UNIT Caps Take 1 capsule by mouth in the morning, at noon, and at bedtime.   docusate sodium 100 MG capsule Commonly known as: COLACE Take 200 mg by mouth in the morning.   feeding supplement Liqd Take 237 mLs by mouth 2 (two) times daily between meals for 10 days.   metoprolol tartrate 50 MG tablet Commonly known as: LOPRESSOR Take 50 mg by mouth 2 (two) times daily.   multivitamin with minerals Tabs tablet Take 1 tablet by mouth daily in the afternoon.   ondansetron 4 MG disintegrating tablet Commonly known as: Zofran ODT Take 1 tablet (4 mg total) by mouth every 8 (eight) hours as needed for nausea or vomiting.   oxybutynin 5 MG tablet Commonly known as: DITROPAN Take 5 mg by mouth 3 (three) times daily.   pantoprazole 40 MG tablet Commonly known as: PROTONIX Take 1 tablet (40 mg total) by mouth daily for 15 days.   potassium chloride 20 MEQ packet Commonly known as: KLOR-CON Take 20 mEq by mouth See admin instructions. Taking twice daily on two days a week, Mon, Thursday   rosuvastatin 20 MG tablet Commonly known as: CRESTOR Take 20 mg by mouth in the morning.   verapamil 120 MG CR tablet Commonly known as: CALAN-SR Take 120 mg by mouth at bedtime.   vitamin C 1000 MG tablet Take 1,000 mg by mouth daily with lunch.        Time coordinating discharge: 39 minutes  Signed:  Jendayi Berling  [facility] 09/24/2021, 11:43 AM

## 2021-09-24 NOTE — TOC Progression Note (Signed)
Transition of Care [facility]) - Progression Note    Patient Details  Name: [name] MRN: [id number] Date of Birth: [birth year]  [facility]) CM/SW Contact  Leeroy Cha, RN Phone Number: 09/24/2021, 11:02 AM  Clinical Narrative:    Dcd to home no toc needs present   Expected Discharge Plan: Home/Self Care Barriers to Discharge: Barriers Resolved  Expected Discharge Plan and Services Expected Discharge Plan: Home/Self Care   Discharge Planning Services: CM Consult   Living arrangements for the past 2 months: Single Family Home Expected Discharge Date: 09/24/21                                     Social Determinants of Health (SDOH) Interventions    Readmission Risk Interventions No flowsheet data found.

## 2021-09-25 LAB — URINE CULTURE: Culture: >=100000 — AB

## 2022-04-20 ENCOUNTER — Other Ambulatory Visit: Payer: Self-pay | Admitting: Urology

## 2022-04-28 NOTE — Progress Notes (Addendum)
COVID Vaccine Completed: yes x3 04/22/20, 07/10/20 07/20/21  Date of COVID positive in last 90 days: no  PCP - Margretta Sidle, MD Cardiologist - n/a  Chest x-ray - n/a EKG - 04/29/22 Epic/cahrt Stress Test - n/a ECHO -  n/a Cardiac Cath - n/a Pacemaker/ICD device last checked: n/a Spinal Cord Stimulator: n/a  Bowel Prep - no  Sleep Study - n/a CPAP -   Fasting Blood Sugar - n/a Checks Blood Sugar _____ times a day  Blood Thinner Instructions: n/a Aspirin Instructions: Last Dose:  Activity level: patient is wheel chair dependent. Denies any symptoms of chest pain or SOB.   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 Patient was also instructed that they will need to review over the PAT instructions again at home before surgery.

## 2022-04-28 NOTE — Patient Instructions (Addendum)
SURGICAL WAITING ROOM VISITATION Patients having surgery or a procedure may have no more than 2 support people in the waiting area - these visitors may rotate.   Children under the age of 97 must have an adult with them who is not the patient. If the patient needs to stay at the hospital during part of their recovery, the visitor guidelines for inpatient rooms apply. Pre-op nurse will coordinate an appropriate time for 1 support person to accompany patient in pre-op.  This support person may not rotate.    Please refer to the [facility] website for the visitor guidelines for Inpatients (after your surgery is over and you are in a regular room).    Your procedure is scheduled on: 05/09/22   Report to [facility] Main Entrance    Report to admitting at 8:45 AM   Call this number if you have problems the morning of surgery [phone]   Do not eat food :After Midnight.   After Midnight you may have the following liquids until 8:00 AM DAY OF SURGERY  Water Non-Citrus Juices (without pulp, NO RED) Carbonated Beverages Black Coffee (NO MILK/CREAM OR CREAMERS, sugar ok)  Clear Tea (NO MILK/CREAM OR CREAMERS, sugar ok) regular and decaf                             Plain Jell-O (NO RED)                                           Fruit ices (not with fruit pulp, NO RED)                                     Popsicles (NO RED)                                                               Sports drinks like Gatorade (NO RED)  FOLLOW BOWEL PREP AND ANY ADDITIONAL PRE OP INSTRUCTIONS YOU RECEIVED FROM YOUR SURGEON'S OFFICE!!!     Oral Hygiene is also important to reduce your risk of infection.                                    Remember - BRUSH YOUR TEETH THE MORNING OF SURGERY WITH YOUR REGULAR TOOTHPASTE   Take these medicines the morning of surgery with A SIP OF WATER: Tylenol, Metoprolol, Zofran, Oxybutynin, Pantoprazole, Rosuvastatin                               You may not have  any metal on your body including jewelry, and body piercing             Do not wear lotions, powders, cologne, or deodorant              Men may shave face and neck.   Do not bring valuables to the hospital. [facility] IS NOT  RESPONSIBLE   FOR VALUABLES.  DO NOT [place]. PHARMACY WILL DISPENSE MEDICATIONS LISTED ON YOUR MEDICATION LIST TO YOU DURING YOUR ADMISSION [place]!    Patients discharged on the day of surgery will not be allowed to drive home.  Someone NEEDS to stay with you for the first 24 hours after anesthesia.   Special Instructions: Bring a copy of your healthcare power of attorney and living will documents         the day of surgery if you haven't scanned them before.              Please read over the following fact sheets you were given: IF YOU HAVE QUESTIONS ABOUT YOUR PRE-OP INSTRUCTIONS PLEASE CALL [facility] - Preparing for Surgery Before surgery, you can play an important role.  Because skin is not sterile, your skin needs to be as free of germs as possible.  You can reduce the number of germs on your skin by washing with CHG (chlorahexidine gluconate) soap before surgery.  CHG is an antiseptic cleaner which kills germs and bonds with the skin to continue killing germs even after washing. Please DO NOT use if you have an allergy to CHG or antibacterial soaps.  If your skin becomes reddened/irritated stop using the CHG and inform your nurse when you arrive at Short Stay. Do not shave (including legs and underarms) for at least 48 hours prior to the first CHG shower.  You may shave your face/neck.  Please follow these instructions carefully:  1.  Shower with CHG Soap the night before surgery and the  morning of surgery.  2.  If you choose to wash your hair, wash your hair first as usual with your normal  shampoo.  3.  After you shampoo, rinse your hair and body thoroughly to remove the  shampoo.                             4.  Use CHG as you would any other liquid soap.  You can apply chg directly to the skin and wash.  Gently with a scrungie or clean washcloth.  5.  Apply the CHG Soap to your body ONLY FROM THE NECK DOWN.   Do   not use on face/ open                           Wound or open sores. Avoid contact with eyes, ears mouth and   genitals (private parts).                       Wash face,  Genitals (private parts) with your normal soap.             6.  Wash thoroughly, paying special attention to the area where your    surgery  will be performed.  7.  Thoroughly rinse your body with warm water from the neck down.  8.  DO NOT shower/wash with your normal soap after using and rinsing off the CHG Soap.                9.  Pat yourself dry with a clean towel.            10.  Wear clean pajamas.  11.  Place clean sheets on your bed the night of your first shower and do not  sleep with pets. Day of Surgery : Do not apply any lotions/deodorants the morning of surgery.  Please wear clean clothes to the hospital/surgery center.  FAILURE TO FOLLOW THESE INSTRUCTIONS MAY RESULT IN THE CANCELLATION OF YOUR SURGERY  PATIENT SIGNATURE_________________________________  NURSE SIGNATURE__________________________________  ________________________________________________________________________

## 2022-04-29 ENCOUNTER — Encounter (HOSPITAL_COMMUNITY): Payer: Self-pay

## 2022-04-29 ENCOUNTER — Encounter (HOSPITAL_COMMUNITY)
Admission: RE | Admit: 2022-04-29 | Discharge: 2022-04-29 | Disposition: A | Discharge status: Home / Self Care | Payer: Medicare Other | Source: Ambulatory Visit | Attending: Urology | Admitting: Urology

## 2022-04-29 VITALS — BP 103/82 | HR 70 | Temp 97.7°F | Resp 16 | Ht 60.0 in | Wt 160.0 lb

## 2022-04-29 DIAGNOSIS — I952 Hypotension due to drugs: Secondary | ICD-10-CM | POA: Insufficient documentation

## 2022-04-29 DIAGNOSIS — Z01818 Encounter for other preprocedural examination: Secondary | ICD-10-CM | POA: Insufficient documentation

## 2022-04-29 HISTORY — DX: Personal history of urinary calculi: Z87.442

## 2022-04-29 LAB — BASIC METABOLIC PANEL
Anion gap: 7 (ref 5–15)
BUN: 14 mg/dL (ref 6–20)
CO2: 28 mmol/L (ref 22–32)
Calcium: 9.1 mg/dL (ref 8.9–10.3)
Chloride: 103 mmol/L (ref 98–111)
Creatinine, Ser: 0.58 mg/dL — ABNORMAL LOW (ref 0.61–1.24)
GFR, Estimated: >60 mL/min (ref >60)
Glucose, Bld: 75 mg/dL (ref 70–99)
Potassium: 3.9 mmol/L (ref 3.5–5.1)
Sodium: 138 mmol/L (ref 135–145)

## 2022-04-29 LAB — CBC
HCT: 46.1 % (ref 39.0–52.0)
Hemoglobin: 14.3 g/dL (ref 13.0–17.0)
MCH: 25.5 pg — ABNORMAL LOW (ref 26.0–34.0)
MCHC: 31 g/dL (ref 30.0–36.0)
MCV: 82.3 fL (ref 80.0–100.0)
Platelets: 216 10*3/uL (ref 150–400)
RBC: 5.6 MIL/uL (ref 4.22–5.81)
RDW: 14.4 % (ref 11.5–15.5)
WBC: 9 10*3/uL (ref 4.0–10.5)
nRBC: 0 % (ref 0.0–0.2)

## 2022-05-01 IMAGING — XA IR CATHETER TUBE CHANGE
2 series · 11 of 11 positions shown · non-contrast
Comparison: Image guided suprapubic catheter placement-01/17/2020

INDICATION: History of neurogenic bladder with previous image guided suprapubic
catheter placement most recently on 01/17/2020, previously,
12/31/2018 (note, previous bedside exchange the suprapubic catheter
was unsuccessful requiring new definitive suprapubic catheter
placement).

Patient presents today for fluoroscopic guided exchange and
potential conversion to a balloon retention suprapubic catheter.
EXAM:
FLUOROSCOPIC GUIDED SUPRAPUBIC CATHETER EXCHANGE, UPSIZE AND
CONVERSION TO A BALLOON RETENTION CATHETER

[Series 2: fl - angio · 4 of 29 frames shown]
[frame 5/29]
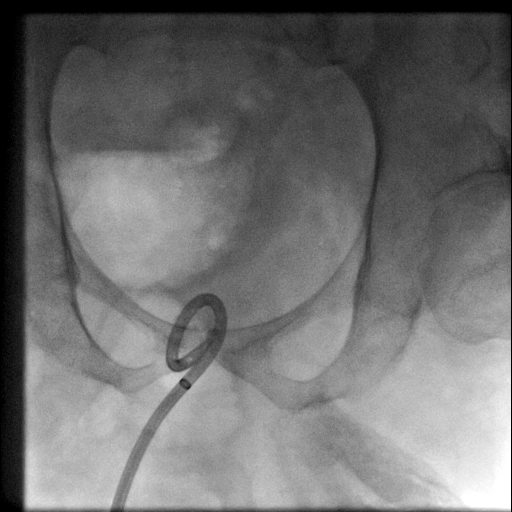
[frame 10/29]
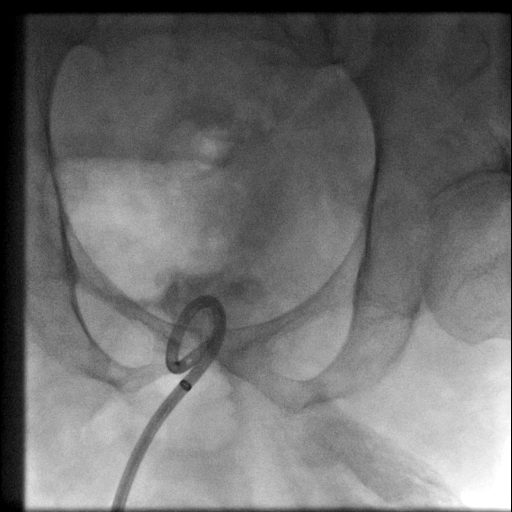
[frame 15/29]
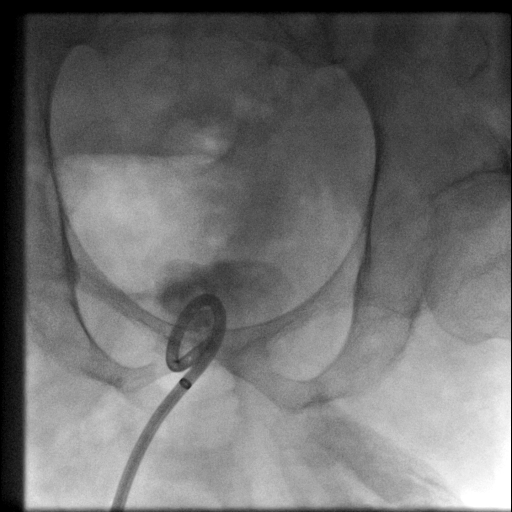
[frame 25/29]
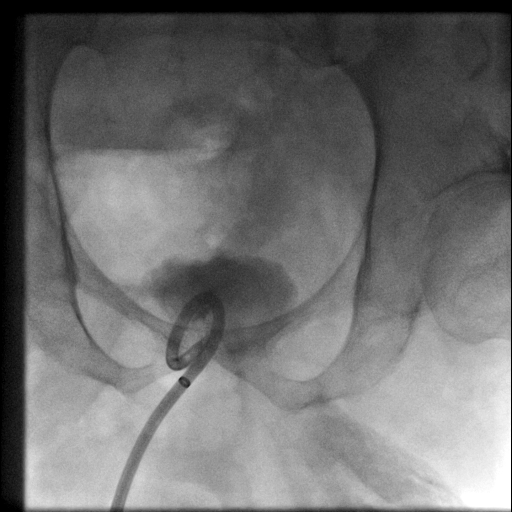

[Series 300: tube placements · 7 of 7 slices shown]
[im 1/7]
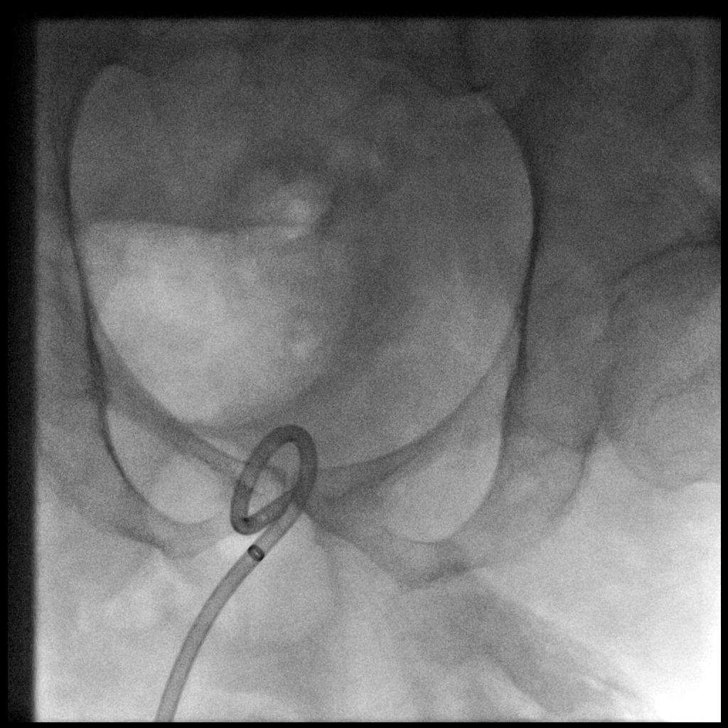
[im 2/7]
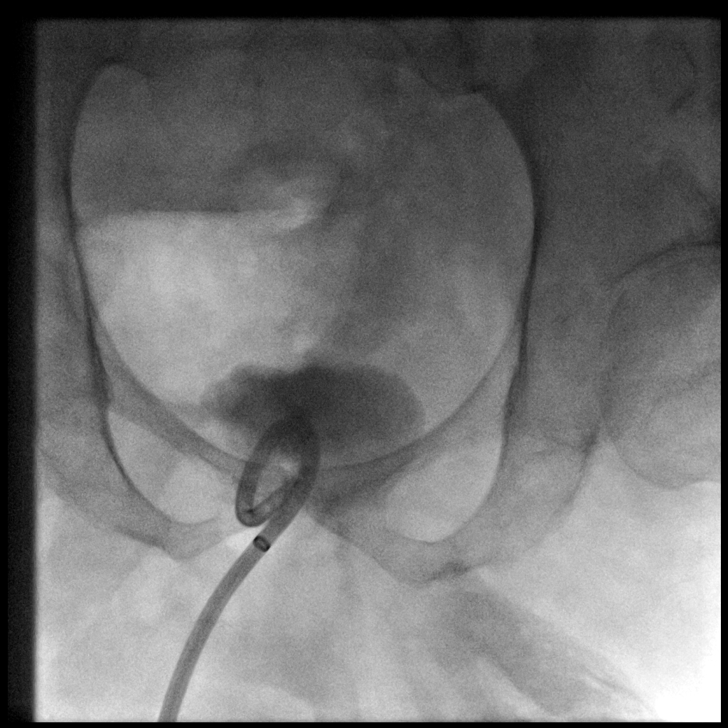
[im 3/7]
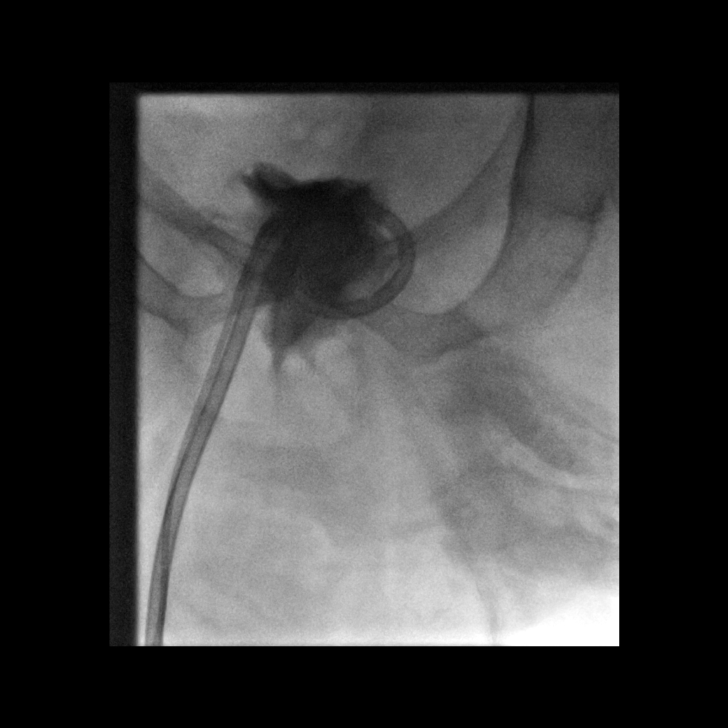
[im 4/7]
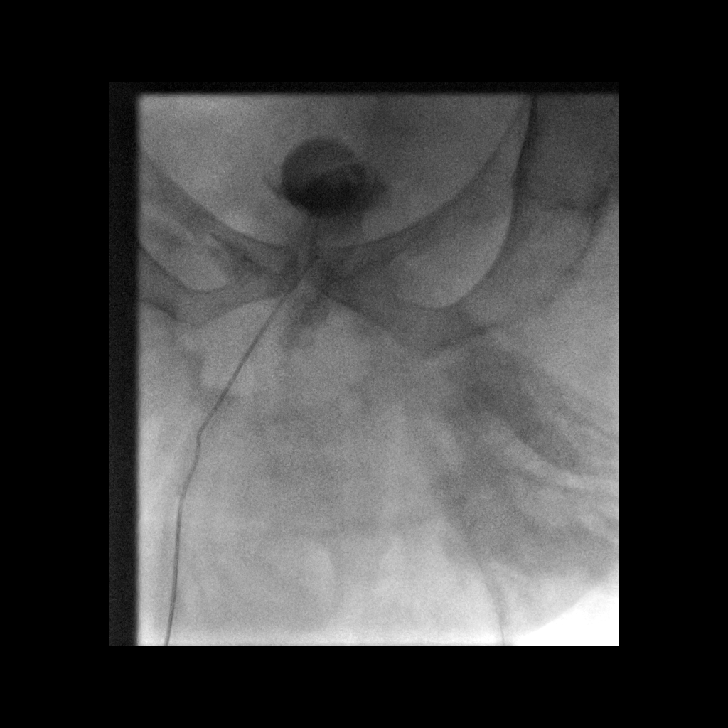
[im 5/7]
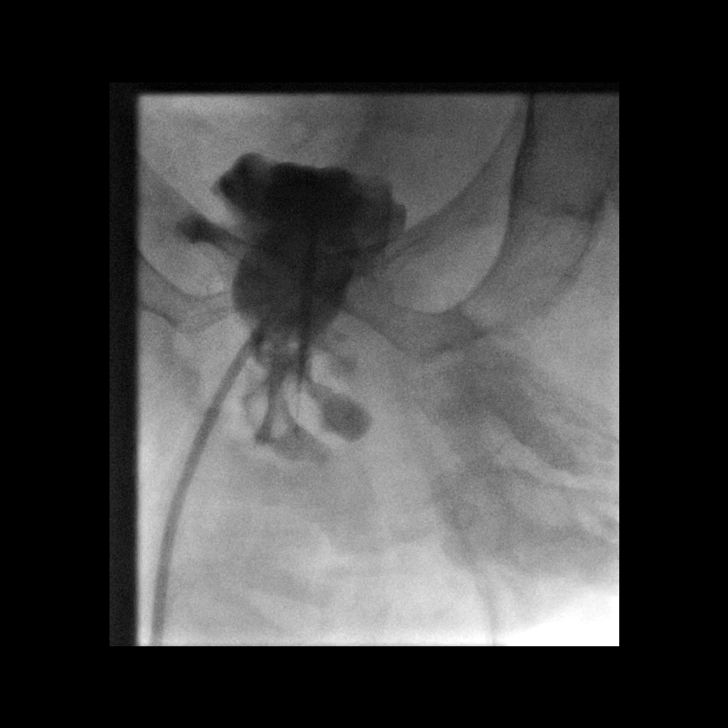
[im 6/7]
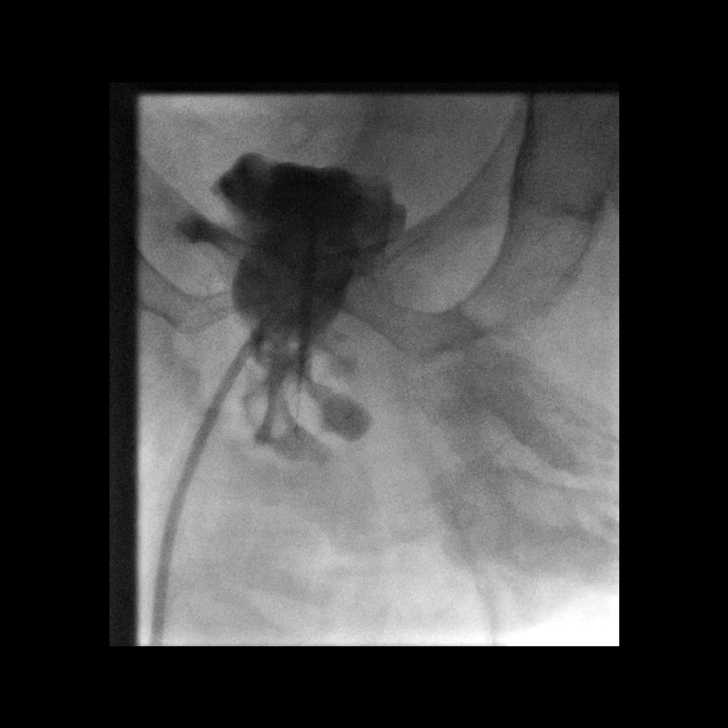
[im 7/7]
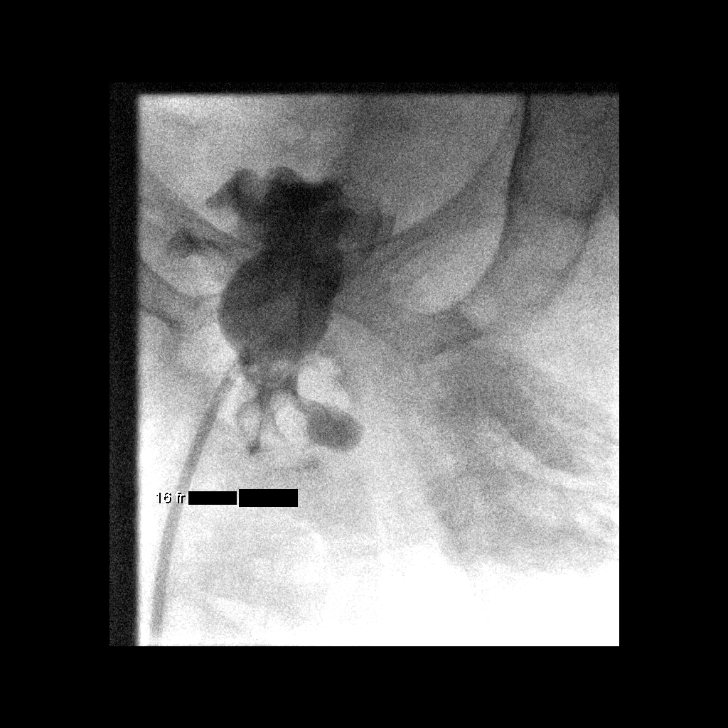

[11 of 11 positions shown; findings below may reference images not displayed]

MEDICATIONS:
None

ANESTHESIA/SEDATION:
None

CONTRAST:  10 cc Omnipaque 300, administered into the urinary
bladder.

FLUOROSCOPY TIME:  4 minutes, 6 seconds (205 mGy)

COMPLICATIONS:
None immediate.

PROCEDURE:
The procedure, risks, benefits, and alternatives were explained to
the patient. Questions regarding the procedure were encouraged and
answered. The patient understands and consents to the procedure. A
timeout was performed prior to the initiation of the procedure.

The external portion of the existing suprapubic catheter as well as
the surrounding skin were prepped and draped in usual sterile
fashion

A preprocedural spot fluoroscopic image was obtained of the lower
pelvis and existing 12 French all-purpose drainage catheter with end
coiled and locked overlying the expected location of the urinary
bladder.

Contrast injection confirmed appropriate positioning and
functionality existing suprapubic catheter.

The external portion of the existing suprapubic catheter was cut and
cannulated with a short Amplatz wire, however the wire was unable to
be advanced throughout the entirety of the catheter secondary to
debris. Attempts were made to cannulate a stiff Glidewire through
the existing suprapubic catheter however this also proved
unsuccessful secondary to debris.

As such, a Kumpe catheter was advanced alongside the existing
suprapubic catheter to the level of the urinary bladder. Contrast
injection confirmed appropriate positioning.

Next, the superior catheter was removed and over a short Amplatz
wire which was coiled within the urinary bladder, the Kumpe catheter
was exchanged for serial dilators ultimately allowing placement of a
16 French balloon retention suprapubic catheter. The retention
balloon was insufflated with approximately 7 cc of saline and dilute
contrast. The external disc was cinched.

Appropriate positioning was confirmed with the injection of a small
amount of contrast as well as the efflux of urine.

The catheter was connected to a gravity bag. A dressing was placed.
The patient tolerated the procedure well without immediate
postprocedural complication.
FINDINGS: Preprocedural imaging demonstrates unchanged positioning of
all-purpose drainage catheter with end coiled and locked over the
expected location of the urinary bladder.

Contrast injection confirms appropriate positioning and
functionality of the existing percutaneous drainage catheter.

Large amount of debris was noted within the suprapubic catheter
however ultimately, existing 12 French all-purpose drainage catheter
was exchanged for a larger 16 French balloon retention suprapubic
catheter.
IMPRESSION: Technically successful fluoroscopic guided exchange, up sizing and
conversion to a 16 Fr balloon retention suprapubic catheter for the
purposes of chronic urinary bladder decompression.

PLAN:
The patient is to return for routine fluoroscopic guided exchange
and potential continued up sizing in 6 weeks.

Again, given previous episodes of failure to successfully exchanged
the suprapubic catheter at the bedside, would recommend routine
fluoroscopic guided exchanges for the foreseeable future.

## 2022-05-08 NOTE — Anesthesia Preprocedure Evaluation (Signed)
Anesthesia Evaluation  Patient identified by MRN, date of birth, ID band Patient awake    Reviewed: Allergy & Precautions, NPO status , Patient's Chart, lab work & pertinent test results, reviewed documented beta blocker date and time   History of Anesthesia Complications Negative for: history of anesthetic complications  Airway Mallampati: III  TM Distance: >3 FB Neck ROM: Full    Dental  (+) Poor Dentition, Chipped, Missing   Pulmonary neg pulmonary ROS,    Pulmonary exam normal        Cardiovascular hypertension, Pt. on home beta blockers and Pt. on medications Normal cardiovascular exam     Neuro/Psych  Headaches, Seizures - (none since age 41), Well Controlled,   Spina bifida Paraplegic   negative psych ROS   GI/Hepatic Neg liver ROS, GERD  Controlled,  Endo/Other  negative endocrine ROS  Renal/GU Renal disease    Neurogenic bladder     Musculoskeletal negative musculoskeletal ROS (+)   Abdominal   Peds  Hematology  (+) Blood dyscrasia, anemia ,   Anesthesia Other Findings Hx prolonged paralysis following succinylcholine   Reproductive/Obstetrics                            Anesthesia Physical  Anesthesia Plan  ASA: 3  Anesthesia Plan: MAC   Post-op Pain Management:    Induction: Intravenous  PONV Risk Score and Plan: 1 and Propofol infusion and Treatment may vary due to age or medical condition  Airway Management Planned: Natural Airway, Simple Face Mask and Nasal Cannula  Additional Equipment: None  Intra-op Plan:   Post-operative Plan:   Informed Consent: I have reviewed the patients History and Physical, chart, labs and discussed the procedure including the risks, benefits and alternatives for the proposed anesthesia with the patient or authorized representative who has indicated his/her understanding and acceptance.     Dental advisory given  Plan  Discussed with: Anesthesiologist and CRNA  Anesthesia Plan Comments:        Anesthesia Quick Evaluation

## 2022-05-09 ENCOUNTER — Other Ambulatory Visit: Payer: Self-pay

## 2022-05-09 ENCOUNTER — Encounter (HOSPITAL_COMMUNITY)
Admission: RE | Disposition: A | Discharge status: Home / Self Care | Payer: Self-pay | Source: Ambulatory Visit | Attending: Urology

## 2022-05-09 ENCOUNTER — Ambulatory Visit (HOSPITAL_COMMUNITY): Payer: Medicare Other | Admitting: Physician Assistant

## 2022-05-09 ENCOUNTER — Ambulatory Visit (HOSPITAL_COMMUNITY)
Admission: RE | Admit: 2022-05-09 | Discharge: 2022-05-09 | Disposition: A | Discharge status: Home / Self Care | Payer: Medicare Other | Source: Ambulatory Visit | Attending: Urology | Admitting: Urology

## 2022-05-09 ENCOUNTER — Encounter (HOSPITAL_COMMUNITY): Payer: Self-pay | Admitting: Urology

## 2022-05-09 ENCOUNTER — Ambulatory Visit (HOSPITAL_BASED_OUTPATIENT_CLINIC_OR_DEPARTMENT_OTHER): Payer: Medicare Other | Admitting: Anesthesiology

## 2022-05-09 DIAGNOSIS — D649 Anemia, unspecified: Secondary | ICD-10-CM | POA: Diagnosis not present

## 2022-05-09 DIAGNOSIS — N319 Neuromuscular dysfunction of bladder, unspecified: Secondary | ICD-10-CM | POA: Insufficient documentation

## 2022-05-09 DIAGNOSIS — N210 Calculus in bladder: Secondary | ICD-10-CM | POA: Insufficient documentation

## 2022-05-09 DIAGNOSIS — I10 Essential (primary) hypertension: Secondary | ICD-10-CM

## 2022-05-09 DIAGNOSIS — D759 Disease of blood and blood-forming organs, unspecified: Secondary | ICD-10-CM | POA: Insufficient documentation

## 2022-05-09 DIAGNOSIS — N3021 Other chronic cystitis with hematuria: Secondary | ICD-10-CM | POA: Diagnosis not present

## 2022-05-09 DIAGNOSIS — Q059 Spina bifida, unspecified: Secondary | ICD-10-CM | POA: Diagnosis not present

## 2022-05-09 DIAGNOSIS — G8220 Paraplegia, unspecified: Secondary | ICD-10-CM | POA: Insufficient documentation

## 2022-05-09 HISTORY — PX: CYSTOSCOPY WITH LITHOLAPAXY: SHX1425

## 2022-05-09 SURGERY — CYSTOSCOPY, WITH BLADDER CALCULUS LITHOLAPAXY
Anesthesia: Monitor Anesthesia Care | Site: Penis

## 2022-05-09 MED ORDER — AMISULPRIDE (ANTIEMETIC) 5 MG/2ML IV SOLN
10.0000 mg | Freq: Once | INTRAVENOUS | Status: DC | PRN
Start: 2022-05-09 — End: 2022-05-09

## 2022-05-09 MED ORDER — FENTANYL CITRATE PF 50 MCG/ML IJ SOSY: 25.0000 ug | PREFILLED_SYRINGE | INTRAMUSCULAR | Status: DC | PRN

## 2022-05-09 MED ORDER — ONABOTULINUMTOXINA 100 UNITS IJ SOLR
INTRAMUSCULAR | Status: DC | PRN
  Administered 2022-05-09: 300 [IU] via INTRAMUSCULAR

## 2022-05-09 MED ORDER — 0.9 % SODIUM CHLORIDE (POUR BTL) OPTIME
TOPICAL | Status: DC | PRN
  Administered 2022-05-09: 1000 mL

## 2022-05-09 MED ORDER — CHLORHEXIDINE GLUCONATE 0.12 % MT SOLN
15.0000 mL | Freq: Once | OROMUCOSAL | Status: AC
  Administered 2022-05-09: 15 mL via OROMUCOSAL

## 2022-05-09 MED ORDER — ACETAMINOPHEN 500 MG PO TABS
1000.0000 mg | ORAL_TABLET | Freq: Once | ORAL | Status: AC
  Administered 2022-05-09: 1000 mg via ORAL
  Filled 2022-05-09: qty 2

## 2022-05-09 MED ORDER — ORAL CARE MOUTH RINSE: 15.0000 mL | Freq: Once | OROMUCOSAL | Status: AC

## 2022-05-09 MED ORDER — ONABOTULINUMTOXINA 100 UNITS IJ SOLR
INTRAMUSCULAR | Status: AC
  Filled 2022-05-09: qty 100

## 2022-05-09 MED ORDER — PROPOFOL 500 MG/50ML IV EMUL
INTRAVENOUS | Status: AC
  Filled 2022-05-09: qty 50

## 2022-05-09 MED ORDER — MEROPENEM 1 G IV SOLR
1.0000 g | Freq: Once | INTRAVENOUS | Status: AC
  Administered 2022-05-09: 1 g via INTRAVENOUS
  Filled 2022-05-09: qty 20

## 2022-05-09 MED ORDER — SODIUM CHLORIDE (PF) 0.9 % IJ SOLN
INTRAMUSCULAR | Status: AC
  Filled 2022-05-09: qty 30

## 2022-05-09 MED ORDER — STERILE WATER FOR IRRIGATION IR SOLN
Status: DC | PRN
  Administered 2022-05-09: 9000 mL

## 2022-05-09 MED ORDER — MIDAZOLAM HCL 2 MG/2ML IJ SOLN
INTRAMUSCULAR | Status: AC
  Filled 2022-05-09: qty 2

## 2022-05-09 MED ORDER — MIDAZOLAM HCL 2 MG/2ML IJ SOLN
INTRAMUSCULAR | Status: DC | PRN
  Administered 2022-05-09: 2 mg via INTRAVENOUS

## 2022-05-09 MED ORDER — METOPROLOL TARTRATE 25 MG PO TABS
50.0000 mg | ORAL_TABLET | ORAL | Status: AC
  Administered 2022-05-09: 50 mg via ORAL
  Filled 2022-05-09: qty 2

## 2022-05-09 MED ORDER — PROPOFOL 500 MG/50ML IV EMUL
INTRAVENOUS | Status: DC | PRN
  Administered 2022-05-09: 100 ug/kg/min via INTRAVENOUS

## 2022-05-09 MED ORDER — CELECOXIB 200 MG PO CAPS
200.0000 mg | ORAL_CAPSULE | Freq: Once | ORAL | Status: AC
  Administered 2022-05-09: 200 mg via ORAL
  Filled 2022-05-09: qty 1

## 2022-05-09 MED ORDER — ONABOTULINUMTOXINA 100 UNITS IJ SOLR
INTRAMUSCULAR | Status: AC
Start: 2022-05-09 — End: ?
  Filled 2022-05-09: qty 100

## 2022-05-09 MED ORDER — STERILE WATER FOR IRRIGATION IR SOLN
Status: DC | PRN
  Administered 2022-05-09: 250 mL

## 2022-05-09 MED ORDER — LACTATED RINGERS IV SOLN: INTRAVENOUS | Status: DC

## 2022-05-09 MED ORDER — PROMETHAZINE HCL 25 MG/ML IJ SOLN: 6.2500 mg | INTRAMUSCULAR | Status: DC | PRN

## 2022-05-09 SURGICAL SUPPLY — 21 items
BAG URO CATCHER STRL LF (MISCELLANEOUS) ×2 IMPLANT
CATH FOLEY LATEX FREE 22FR (CATHETERS) ×2
CATH FOLEY LF 22FR (CATHETERS) ×1 IMPLANT
CLOTH BEACON ORANGE TIMEOUT ST (SAFETY) ×2 IMPLANT
GLOVE BIO SURGEON STRL SZ7.5 (GLOVE) ×2 IMPLANT
GOWN STRL REUS W/ TWL XL LVL3 (GOWN DISPOSABLE) ×4 IMPLANT
GOWN STRL REUS W/TWL XL LVL3 (GOWN DISPOSABLE) ×4
KIT TURNOVER KIT A (KITS) IMPLANT
LASER FIB FLEXIVA PULSE ID 550 (Laser) ×2 IMPLANT
LASER FIB FLEXIVA PULSE ID 910 (Laser) ×1 IMPLANT
MANIFOLD NEPTUNE II (INSTRUMENTS) ×2 IMPLANT
NDL ASPIRATION 22 (NEEDLE) ×1 IMPLANT
NDL SAFETY ECLIP 18X1.5 (MISCELLANEOUS) IMPLANT
NEEDLE ASPIRATION 22 (NEEDLE) ×2 IMPLANT
PACK CYSTO (CUSTOM PROCEDURE TRAY) ×2 IMPLANT
SYR CONTROL 10ML LL (SYRINGE) IMPLANT
SYR TOOMEY IRRIG 70ML (MISCELLANEOUS)
SYRINGE TOOMEY IRRIG 70ML (MISCELLANEOUS) IMPLANT
TUBING CONNECTING 10 (TUBING) IMPLANT
TUBING UROLOGY SET (TUBING) ×2 IMPLANT
WATER STERILE IRR 3000ML UROMA (IV SOLUTION) ×2 IMPLANT

## 2022-05-09 NOTE — Anesthesia Procedure Notes (Signed)
Procedure Name: MAC Date/Time: 05/09/2022 11:00 AM  Performed by: Lollie Sails, CRNAPre-anesthesia Checklist: Patient identified, Emergency Drugs available, Suction available, Patient being monitored and Timeout performed Oxygen Delivery Method: Simple face mask Placement Confirmation: positive ETCO2

## 2022-05-09 NOTE — Transfer of Care (Signed)
Immediate Anesthesia Transfer of Care Note  Patient: [name]  Procedure(s) Performed: CYSTOSCOPY WITH LITHOLAPAXY, BOTOX INJECTION (Penis)  Patient Location: PACU  Anesthesia Type:MAC  Level of Consciousness: sedated and responds to stimulation  Airway & Oxygen Therapy: Patient Spontanous Breathing and Patient connected to face mask oxygen  Post-op Assessment: Report given to RN and Post -op Vital signs reviewed and stable  Post vital signs: Reviewed and stable  Last Vitals:  Vitals Value Taken Time  BP 106/62 05/09/22 1200  Temp    Pulse 71 05/09/22 1200  Resp 16 05/09/22 1200  SpO2 100 % 05/09/22 1200  Vitals shown include unvalidated device data.  Last Pain:  Vitals:   05/09/22 0850  TempSrc: Oral         Complications: No notable events documented.

## 2022-05-09 NOTE — H&P (Signed)
CC/HPI: This is a patient of Dr. Purvis Sheffield who is seen on call for his suprapubic tube which is not draining well. His mother can flush it but gets no return and he just leaks from the penis. The tube was He has had a moderate amount of drainage from his penis. He denies fevers or chills. He denies abdominal pain or flank pain. He has some gross hematuria.   He has a NGB and last had botox in 11/22 and had the foley changed on 04/08/22. He has had prior bladder stones.     ALLERGIES: Cipro TABS Contrast Media Ready-Box MISC Latex Gloves MISC Vancomycin HCl SOLR    MEDICATIONS: Antacid  Calcium + D  Cranberry Fruit  Docusate Sodium  Klor-Con 20 meq packet  Metoprolol Tartrate 50 mg tablet Oral  Multi-Day Vitamins tablet Oral  Rosuvastatin Calcium 20 mg tablet  Tylenol Arthritis PRN  Verapamil Hcl 1 PO Daily  Vitamin C  Zofran 4 mg tablet Oral     GU PSH: Catheterization For Collection Of Specimen, Single Patient, [place], 2019, 2019 Catheterize For Residual - 2019, 2017 Compl Change SP Tube - 07/26/2021, 08/11/2020, 2021, 2020 Complex cystometrogram, w/ void pressure and urethral pressure profile studies, any technique - 2020 Complex Uroflow - 2020 Cysto Bladder Stone <2.5cm - 07/01/2020 Cystoscopy - 04/27/2020, 2021, 2021 Cystourethroscopy, W/Injection For Chemodenervation Of Bladder - 07/26/2021, 12/14/2020, 07/01/2020, 2021, 2020 Emg surf Electrd - 2020 Inject For cystogram - 2020 Insert Bladder Cath; Complex - 04/27/2020, 2021 Intrabd voidng Press - 2020 Simple Change SP Tube - 04/08/2022, 03/11/2022, 02/11/2022, 01/07/2022, 12/10/2021, 11/05/2021, 10/01/2021, 09/16/2021, 09/03/2021, 08/25/2021, 07/08/2021, 06/23/2021, 06/09/2021, 05/27/2021, 05/10/2021, 04/30/2021, 03/26/2021, 02/23/2021, 02/10/2021, 01/13/2021, 12/14/2020, 11/24/2020, 10/30/2020, 2022, 2022, 2022, 08/28/2020, 07/31/2020, 07/17/2020, 07/01/2020, 06/09/2020, 2021, 2021, 2021, 2021, 2020, 2020, 2020, 2020       PSH  Notes: Ileostomy, Colostomy, Cholecystectomy, Appendectomy   NON-GU PSH: Appendectomy - 2016 Cholecystectomy (open) - 2016 Colostomy - 2016     GU PMH: Incomplete bladder emptying - 04/08/2022, - 01/07/2022, - 10/01/2021, - 01/13/2021, - 2022, - 08/28/2020, - 07/31/2020, - 2020 Bladder, Neuromuscular dysfunction, Unspec - 03/11/2022, - 02/11/2022, - 12/10/2021, - 11/05/2021, - 10/01/2021, - 09/16/2021, - 08/25/2021, - 07/08/2021, - 06/23/2021, - 06/09/2021, - 05/27/2021, - 05/10/2021, - 04/30/2021, - 03/26/2021, - 02/23/2021, - 02/10/2021, - 01/13/2021, - 11/24/2020, - 10/30/2020, - 2022, - 2022, - 08/11/2020, - 07/17/2020, - 07/16/2020, - 06/09/2020, Neurogenic bladder, - 2016 Bacteriuria - 09/03/2021 Unihibited neuropathic bladder - 09/03/2021, - 07/08/2021, - 02/10/2021, - 2022, - 2020, - 2020 Bladder, Oth neuromuscular dysfunction - 02/10/2021, - 01/13/2021, - 2022, - 06/09/2020, - 2020 Gross hematuria - 07/16/2020 Incontinence w/o Sensation - 2020 Candidal balanitis - 2019 Oth urogenital candidiasis - 2019 Obstructive and reflux uropathy, Unspec - 2019 Chronic cystitis (w/o hematuria), Chronic cystitis - 2016 Urge incontinence, Urge incontinence of urine - 2016    NON-GU PMH: Other mechanical complication of cystostomy catheter, initial encounter - 09/16/2021, - 09/03/2021, - 07/08/2021, - 05/27/2021, - 2022, - 2022, - 07/17/2020, - 2021 Spina bifida, unspecified - 2020 Encounter for general adult medical examination without abnormal findings, Encounter for preventive health examination - 2016 Personal history of other diseases of the circulatory system, History of hypertension - 2016 Personal history of other diseases of the nervous system and sense organs, History of hydrocephalus - 2016 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  Drinks 1 caffeinated drink per day.     Notes: Caffeine use, Never smoker, Alcohol use, Disabled, Single, Number of children, Never used tobacco   REVIEW OF SYSTEMS:    GU Review Male:   Patient denies frequent urination, hard to postpone urination, burning/ pain with urination, get up at night to urinate, leakage of urine, stream starts and stops, trouble starting your stream, have to strain to urinate , erection problems, and penile pain.  Gastrointestinal (Upper):   Patient denies nausea, vomiting, and indigestion/ heartburn.  Gastrointestinal (Lower):   Patient denies diarrhea and constipation.  Constitutional:   Patient denies fever, night sweats, weight loss, and fatigue.  Skin:   Patient denies skin rash/ lesion and itching.  Eyes:   Patient denies blurred vision and double vision.  Ears/ Nose/ Throat:   Patient denies sore throat and sinus problems.  Hematologic/Lymphatic:   Patient denies swollen glands and easy bruising.  Cardiovascular:   Patient denies leg swelling and chest pains.  Respiratory:   Patient denies cough and shortness of breath.  Endocrine:   Patient denies excessive thirst.  Musculoskeletal:   Patient denies back pain and joint pain.  Neurological:   Patient denies headaches and dizziness.  Psychologic:   Patient denies depression and anxiety.   Notes: cath has not been draining well, and not able to flush cath, blood in the urine     VITAL SIGNS: None   Complexity of Data:  Records Review:   Previous Patient Records  X-Ray Review: C.T. Abdomen/Pelvis: Reviewed Films.     PROCEDURES:         Simple Change SP Tube - 51705  The patient's indwelling SP tube was carefully removed. A 22  French Foley catheter was inserted into the bladder using sterile technique. The patient was taught routine catheter care. A catheter plug was connected. Hand irrigation of the bladder with sterile water was performed.   ASSESSMENT:      ICD-10 Details  1 GU:   Bladder, Neuromuscular dysfunction, Unspec - N31.9 Chronic, Worsening - He is having more frequent episodes with catheter obstruction. He last had botox in 11/22 and should be considered for reinjection.   2   Chronic cystitis (w/o hematuria) - N30.20 Chronic, Stable  3 NON-GU:   Encounter for attention to other artificial openings of urinary tract - S34.1 Acute, Uncomplicated - Tube was changed and irrigated but I think the bladder wall around the catheter tip may be impacting drainage at low volumes.      PLAN:           Schedule Return Visit/Planned Activity: Keep Scheduled Appointment          Document Letter(s):  Created for Patient: Clinical Summary         Notes:   CC: Dr. Claria Dice.         Next Appointment:      Next Appointment: 05/13/2022 02:30 PM    Appointment Type: Nurse Visit NO Urine    Location: [facility] [phone]    Provider: NVPodA NVPodA    Reason for Visit: 1 mo cath     Signed by Irine Seal,  M.D. on 04/18/22 at 11:48 AM (EDT

## 2022-05-09 NOTE — Anesthesia Postprocedure Evaluation (Signed)
Anesthesia Post Note  Patient: [name]  Procedure(s) Performed: CYSTOSCOPY WITH LITHOLAPAXY, BOTOX INJECTION (Penis)     Patient location during evaluation: PACU Anesthesia Type: MAC Level of consciousness: awake and alert Pain management: pain level controlled Vital Signs Assessment: post-procedure vital signs reviewed and stable Respiratory status: spontaneous breathing and respiratory function stable Cardiovascular status: stable Postop Assessment: no apparent nausea or vomiting Anesthetic complications: no   No notable events documented.  Last Vitals:  Vitals:   05/09/22 1230 05/09/22 1238  BP: 106/65 111/73  Pulse: 60 66  Resp: 15 14  Temp: 36.5 C 36.5 C  SpO2: 99% 100%    Last Pain:  Vitals:   05/09/22 1238  TempSrc:   PainSc: 0-No pain                 Brodin Gelpi DANIEL

## 2022-05-09 NOTE — Op Note (Signed)
Operative Note  Preoperative diagnosis:  1.  Neurogenic bladder  Postoperative diagnosis: 1.  Neurogenic bladder 2.  Bladder stone, 2 cm  Procedure(s): 1.  Cystolitholapaxy less than 2.5 cm 2.  Intradetrusor Botox injection, 300 units 3.  Simple suprapubic tube exchange  Surgeon: Link Snuffer, MD  Assistants: None  Anesthesia: General  Complications: None immediate  EBL: Minimal  Specimens: 1.  Bladder calculus  Drains/Catheters: 1.  22 French silicone suprapubic tube  Intraoperative findings: 1.  Normal anterior urethra 2.  Prostatic urethra with false passage.  Bladder neck located anteriorly this and was patulous. 3.  Bladder mucosa with some edema but no obvious tumors or masses. 4.  Approximately 2 cm bladder calculus  Indication: 41 year old male with neurogenic bladder managed with suprapubic tube has been having issues with the catheter clogging.  He is also managed with 300 units of Botox every 6 months or so.  This was last performed in November 2022.  He presents for the previously mentioned operation.  Description of procedure:  The patient was identified and consent was obtained.  The patient was taken to the operating room and placed in the supine position.  The patient was placed under general anesthesia.  Perioperative antibiotics were administered.  The patient was placed in dorsal lithotomy.  Patient was prepped and draped in a standard sterile fashion and a timeout was performed.  A 23 French rigid cystoscope was advanced into the urethra and into the bladder.  Complete cystoscopy was performed with findings noted above.  Laser fiber was used to break up the stone into smaller fragments which were then extracted through the scope.  I then exchanged the cystoscope for the cystoscope with the needle/syringe element.  The cystoscope was advanced into the bladder and then 300 units was systematically injected throughout the bladder into the detrusor.  There was  no significant active bleeding noted.  I placed a 22 French suprapubic tube, silicone without difficulty and filled it with 10 cc of sterile water.  I confirmed placement with direct visualization and remove the scope.  This concluded the operation.  Patient tolerated the procedure well was stable postoperative.  Plan: Continue monthly suprapubic tube exchanges and acetic acid irrigations.  Follow-up in 6 months for possible repeat Botox.

## 2022-05-10 ENCOUNTER — Encounter (HOSPITAL_COMMUNITY): Payer: Self-pay | Admitting: Urology

## 2023-05-11 ENCOUNTER — Other Ambulatory Visit: Payer: Self-pay | Admitting: Urology

## 2023-05-22 NOTE — Progress Notes (Signed)
COVID Vaccine received:  []  No [x]  Yes Date of any COVID positive Test in last 90 days: no PCP - Sharmon Revere MD Cardiologist -   Chest x-ray - 01/12/21 Epic EKG - 05/23/23 on chart  Stress Test -  ECHO -  Cardiac Cath -   Bowel Prep - [x]  No  []   Yes ______  Pacemaker / ICD device [x]  No []  Yes   Spinal Cord Stimulator:[x]  No []  Yes       History of Sleep Apnea? [x]  No []  Yes   CPAP used?- [x]  No []  Yes    Does the patient monitor blood sugar?          [x]  No []  Yes  []  N/A  Patient has: [x]  NO Hx DM   []  Pre-DM                 []  DM1  []   DM2 Does patient have a [employer] or Dexacom? []  No []  Yes   Fasting Blood Sugar Ranges-  Checks Blood Sugar _____ times a day  GLP1 agonist / usual dose - no GLP1 instructions:  SGLT-2 inhibitors / usual dose - no SGLT-2 instructions:   Blood Thinner / Instructions:no Aspirin Instructions:no  Comments:   Activity level: Patient is  unable to climb a flight of stairs without difficulty;  []  Paraplegic _   Patient  can not perform ADLs without assistance.   Anesthesia review: Spina bifida, Paraplegia,HTN, Became unresponsive in lobby after PST appointment. Taken to ED.  Patient denies shortness of breath, fever, cough and chest pain at PAT appointment.  Patient verbalized understanding and agreement to the Pre-Surgical Instructions that were given to them at this PAT appointment. Patient was also educated of the need to review these PAT instructions again prior to his/her surgery.I reviewed the appropriate phone numbers to call if they have any and questions or concerns.

## 2023-05-22 NOTE — Patient Instructions (Signed)
SURGICAL WAITING ROOM VISITATION  Patients having surgery or a procedure may have no more than 2 support people in the waiting area - these visitors may rotate.    Children under the age of 30 must have an adult with them who is not the patient.  Due to an increase in RSV and influenza rates and associated hospitalizations, children ages 14 and under may not visit patients in [facility] hospitals.  If the patient needs to stay at the hospital during part of their recovery, the visitor guidelines for inpatient rooms apply. Pre-op nurse will coordinate an appropriate time for 1 support person to accompany patient in pre-op.  This support person may not rotate.    Please refer to the [facility] website for the visitor guidelines for Inpatients (after your surgery is over and you are in a regular room).       Your procedure is scheduled on: 06/02/23   Report to [facility] Main Entrance    Report to admitting at 10:30 AM   Call this number if you have problems the morning of surgery [phone]   Do not eat food or drink liquids :After Midnight.     Oral Hygiene is also important to reduce your risk of infection.                                    Remember - BRUSH YOUR TEETH THE MORNING OF SURGERY WITH YOUR REGULAR TOOTHPASTE  DENTURES WILL BE REMOVED PRIOR TO SURGERY PLEASE DO NOT APPLY "Poly grip" OR ADHESIVES!!!   Stop all vitamins and herbal supplements 7 days before surgery.   Take these medicines the morning of surgery with A SIP OF WATER: Metoprolol, Ditropan, Rosuvastatin             You may not have any metal on your body including hair pins, jewelry, and body piercing             Do not wear  lotions, powders, cologne, or deodorant              Men may shave face and neck.   Do not bring valuables to the hospital. [facility] IS NOT             RESPONSIBLE   FOR VALUABLES.   Contacts, glasses, dentures or bridgework may not be worn into surgery.  DO NOT  BRING YOUR HOME MEDICATIONS TO THE HOSPITAL. PHARMACY WILL DISPENSE MEDICATIONS LISTED ON YOUR MEDICATION LIST TO YOU DURING YOUR ADMISSION IN THE HOSPITAL!    Patients discharged on the day of surgery will not be allowed to drive home.  Someone NEEDS to stay with you for the first 24 hours after anesthesia.   Special Instructions: Bring a copy of your healthcare power of attorney and living will documents the day of surgery if you haven't scanned them before.              Please read over the following fact sheets you were given: IF YOU HAVE QUESTIONS ABOUT YOUR PRE-OP INSTRUCTIONS PLEASE CALL [phone] [name]   If you received a COVID test during your pre-op visit  it is requested that you wear a mask when out in public, stay away from anyone that may not be feeling well and notify your surgeon if you develop symptoms. If you test positive for Covid or have been in contact with anyone that has tested  positive in the last 10 days please notify you surgeon.    [place] - Preparing for Surgery Before surgery, you can play an important role.  Because skin is not sterile, your skin needs to be as free of germs as possible.  You can reduce the number of germs on your skin by washing with CHG (chlorahexidine gluconate) soap before surgery.  CHG is an antiseptic cleaner which kills germs and bonds with the skin to continue killing germs even after washing. Please DO NOT use if you have an allergy to CHG or antibacterial soaps.  If your skin becomes reddened/irritated stop using the CHG and inform your nurse when you arrive at Short Stay. Do not shave (including legs and underarms) for at least 48 hours prior to the first CHG shower.  You may shave your face/neck.  Please follow these instructions carefully:  1.  Shower with CHG Soap the night before surgery and the  morning of surgery.  2.  If you choose to wash your hair, wash your hair first as usual with your normal  shampoo.  3.  After you  shampoo, rinse your hair and body thoroughly to remove the shampoo.                             4.  Use CHG as you would any other liquid soap.  You can apply chg directly to the skin and wash.  Gently with a scrungie or clean washcloth.  5.  Apply the CHG Soap to your body ONLY FROM THE NECK DOWN.   Do   not use on face/ open                           Wound or open sores. Avoid contact with eyes, ears mouth and   genitals (private parts).                       Wash face,  Genitals (private parts) with your normal soap.             6.  Wash thoroughly, paying special attention to the area where your    surgery  will be performed.  7.  Thoroughly rinse your body with warm water from the neck down.  8.  DO NOT shower/wash with your normal soap after using and rinsing off the CHG Soap.                9.  Pat yourself dry with a clean towel.            10.  Wear clean pajamas.            11.  Place clean sheets on your bed the night of your first shower and do not  sleep with pets. Day of Surgery : Do not apply any lotions/deodorants the morning of surgery.  Please wear clean clothes to the hospital/surgery center.  FAILURE TO FOLLOW THESE INSTRUCTIONS MAY RESULT IN THE CANCELLATION OF YOUR SURGERY  PATIENT SIGNATURE_________________________________  NURSE SIGNATURE__________________________________  ________________________________________________________________________

## 2023-05-23 ENCOUNTER — Encounter (HOSPITAL_COMMUNITY): Payer: Self-pay

## 2023-05-23 ENCOUNTER — Emergency Department (HOSPITAL_COMMUNITY): Payer: 59

## 2023-05-23 ENCOUNTER — Other Ambulatory Visit: Payer: Self-pay

## 2023-05-23 ENCOUNTER — Encounter (HOSPITAL_COMMUNITY): Payer: Self-pay | Admitting: Emergency Medicine

## 2023-05-23 ENCOUNTER — Encounter (HOSPITAL_COMMUNITY)
Admission: RE | Admit: 2023-05-23 | Discharge: 2023-05-23 | Disposition: A | Discharge status: Home / Self Care | Payer: 59 | Source: Ambulatory Visit | Attending: Urology | Admitting: Urology

## 2023-05-23 ENCOUNTER — Emergency Department (HOSPITAL_COMMUNITY)
Admission: EM | Admit: 2023-05-23 | Discharge: 2023-05-23 | Disposition: A | Discharge status: Home / Self Care | Payer: 59 | Attending: Emergency Medicine | Admitting: Emergency Medicine

## 2023-05-23 VITALS — BP 146/80 | HR 97 | Temp 97.7°F | Resp 18 | Ht 62.0 in | Wt 164.0 lb

## 2023-05-23 DIAGNOSIS — E871 Hypo-osmolality and hyponatremia: Secondary | ICD-10-CM | POA: Diagnosis not present

## 2023-05-23 DIAGNOSIS — Q059 Spina bifida, unspecified: Secondary | ICD-10-CM | POA: Diagnosis not present

## 2023-05-23 DIAGNOSIS — Z91040 Latex allergy status: Secondary | ICD-10-CM | POA: Insufficient documentation

## 2023-05-23 DIAGNOSIS — Z982 Presence of cerebrospinal fluid drainage device: Secondary | ICD-10-CM | POA: Insufficient documentation

## 2023-05-23 DIAGNOSIS — R404 Transient alteration of awareness: Secondary | ICD-10-CM | POA: Diagnosis not present

## 2023-05-23 DIAGNOSIS — I10 Essential (primary) hypertension: Secondary | ICD-10-CM | POA: Diagnosis not present

## 2023-05-23 DIAGNOSIS — E876 Hypokalemia: Secondary | ICD-10-CM | POA: Insufficient documentation

## 2023-05-23 DIAGNOSIS — R55 Syncope and collapse: Secondary | ICD-10-CM | POA: Diagnosis present

## 2023-05-23 DIAGNOSIS — Z01818 Encounter for other preprocedural examination: Secondary | ICD-10-CM | POA: Insufficient documentation

## 2023-05-23 LAB — URINALYSIS, ROUTINE W REFLEX MICROSCOPIC
Bilirubin Urine: NEGATIVE
Glucose, UA: NEGATIVE mg/dL
Ketones, ur: NEGATIVE mg/dL
Nitrite: NEGATIVE
Protein, ur: NEGATIVE mg/dL
Specific Gravity, Urine: 1.002 — ABNORMAL LOW (ref 1.005–1.030)
pH: 8 (ref 5.0–8.0)

## 2023-05-23 LAB — BASIC METABOLIC PANEL
Anion gap: 15 (ref 5–15)
BUN: 16 mg/dL (ref 6–20)
CO2: 26 mmol/L (ref 22–32)
Calcium: 8.6 mg/dL — ABNORMAL LOW (ref 8.9–10.3)
Chloride: 92 mmol/L — ABNORMAL LOW (ref 98–111)
Creatinine, Ser: 0.49 mg/dL — ABNORMAL LOW (ref 0.61–1.24)
GFR, Estimated: >60 mL/min (ref >60)
Glucose, Bld: 155 mg/dL — ABNORMAL HIGH (ref 70–99)
Potassium: 3 mmol/L — ABNORMAL LOW (ref 3.5–5.1)
Sodium: 133 mmol/L — ABNORMAL LOW (ref 135–145)

## 2023-05-23 LAB — COMPREHENSIVE METABOLIC PANEL
ALT: 44 U/L (ref 0–44)
AST: 25 U/L (ref 15–41)
Albumin: 3.4 g/dL — ABNORMAL LOW (ref 3.5–5.0)
Alkaline Phosphatase: 64 U/L (ref 38–126)
Anion gap: 11 (ref 5–15)
BUN: 16 mg/dL (ref 6–20)
CO2: 28 mmol/L (ref 22–32)
Calcium: 8.4 mg/dL — ABNORMAL LOW (ref 8.9–10.3)
Chloride: 93 mmol/L — ABNORMAL LOW (ref 98–111)
Creatinine, Ser: 0.45 mg/dL — ABNORMAL LOW (ref 0.61–1.24)
GFR, Estimated: >60 mL/min (ref >60)
Glucose, Bld: 98 mg/dL (ref 70–99)
Potassium: 3 mmol/L — ABNORMAL LOW (ref 3.5–5.1)
Sodium: 132 mmol/L — ABNORMAL LOW (ref 135–145)
Total Bilirubin: 0.9 mg/dL (ref 0.3–1.2)
Total Protein: 7.1 g/dL (ref 6.5–8.1)

## 2023-05-23 LAB — CBC WITH DIFFERENTIAL/PLATELET
Abs Immature Granulocytes: 0.05 10*3/uL (ref 0.00–0.07)
Basophils Absolute: 0 10*3/uL (ref 0.0–0.1)
Basophils Relative: 0 %
Eosinophils Absolute: 0.2 10*3/uL (ref 0.0–0.5)
Eosinophils Relative: 2 %
HCT: 44.9 % (ref 39.0–52.0)
Hemoglobin: 13.6 g/dL (ref 13.0–17.0)
Immature Granulocytes: 1 %
Lymphocytes Relative: 16 %
Lymphs Abs: 1.6 10*3/uL (ref 0.7–4.0)
MCH: 24.4 pg — ABNORMAL LOW (ref 26.0–34.0)
MCHC: 30.3 g/dL (ref 30.0–36.0)
MCV: 80.5 fL (ref 80.0–100.0)
Monocytes Absolute: 1.2 10*3/uL — ABNORMAL HIGH (ref 0.1–1.0)
Monocytes Relative: 12 %
Neutro Abs: 7 10*3/uL (ref 1.7–7.7)
Neutrophils Relative %: 69 %
Platelets: 225 10*3/uL (ref 150–400)
RBC: 5.58 MIL/uL (ref 4.22–5.81)
RDW: 14.1 % (ref 11.5–15.5)
WBC: 10 10*3/uL (ref 4.0–10.5)
nRBC: 0 % (ref 0.0–0.2)

## 2023-05-23 LAB — CBC
HCT: 47 % (ref 39.0–52.0)
Hemoglobin: 14.2 g/dL (ref 13.0–17.0)
MCH: 24.8 pg — ABNORMAL LOW (ref 26.0–34.0)
MCHC: 30.2 g/dL (ref 30.0–36.0)
MCV: 82.2 fL (ref 80.0–100.0)
Platelets: 218 10*3/uL (ref 150–400)
RBC: 5.72 MIL/uL (ref 4.22–5.81)
RDW: 14.4 % (ref 11.5–15.5)
WBC: 9.8 10*3/uL (ref 4.0–10.5)
nRBC: 0.2 % (ref 0.0–0.2)

## 2023-05-23 LAB — CBG MONITORING, ED: Glucose-Capillary: 143 mg/dL — ABNORMAL HIGH (ref 70–99)

## 2023-05-23 MED ORDER — POTASSIUM CHLORIDE 20 MEQ PO PACK
40.0000 meq | PACK | Freq: Two times a day (BID) | ORAL | Status: DC
  Administered 2023-05-23: 40 meq via ORAL
  Filled 2023-05-23: qty 2

## 2023-05-23 NOTE — ED Triage Notes (Addendum)
Pt from [facility], rapid response called to front lobby. Pt LOC and snoring on arrival. Checking in for pre procedures r/t upcoming surgical procedure. Pt c/o headache, blurred vision, and appeared to have had absent seizure.    BP 161/96 SpO2 96%

## 2023-05-23 NOTE — Discharge Instructions (Addendum)
You had CT scans and labs done here in the emergency department. No new abnormalities were seen. Please return if you have any new symptoms Please call your doctor to have them follow-up on your urinalysis Please call neurology for follow-up place

## 2023-05-23 NOTE — ED Provider Notes (Signed)
[place] EMERGENCY DEPARTMENT AT [facility] Provider Note   CSN: [id number] Arrival date & time: 05/23/23  1143     History  Chief Complaint  Patient presents with   Loss of Consciousness    [name] is a 42 y.o. male.  HPI 42 year old male history of spina bifida, neurogenic bladder with suprapubic catheter, ileostomy, patient has history of PE shunt.  Patient reports that he had some headache on the right side after waking up this morning.  He was coming to the hospital to have the lab work drawn for a procedure in a couple of weeks.  He is scheduled to have injections and have his suprapubic catheter replaced.  When he was called to have the registration done he was gazing off and unresponsive for several minutes.  Mother reports up to 15 minutes of unresponsiveness.  He did not have any jerking or loss of tone.  She reports he has had some similar episodes in the past but they did not last this long.  Patient is currently back to baseline.  He reports that his headache has essentially resolved.  He has been eating and drinking as usual.  He denies fever, chest pain, shortness of breath, cough pain with urination abdominal pain.   Home Medications Prior to Admission medications   Medication Sig Start Date End Date Taking? Authorizing Provider  acetaminophen (TYLENOL) 650 MG CR tablet Take 1,300 mg by mouth every 8 (eight) hours as needed for pain.    [provider]  Ascorbic Acid (VITAMIN C) 1000 MG tablet Take 1,000 mg by mouth daily with lunch.    [provider]  calcium elemental as carbonate (BARIATRIC TUMS ULTRA) 400 MG chewable tablet Chew 1,000 mg by mouth daily.    [provider]  Cranberry-Vitamin C-Vitamin E 4200-20-3 MG-MG-UNIT CAPS Take 1 capsule by mouth in the morning, at noon, and at bedtime.    [provider]  fluconazole (DIFLUCAN) 150 MG tablet Take 150 mg by mouth once a week. 05/01/23   [provider]  KLOR-CON M20 20 MEQ tablet Take 20 mEq by mouth See admin instructions. Take 20 mEq by mouth twice weekly on Monday and Thursday 03/14/23   [provider]  liver oil-zinc oxide (DESITIN) 40 % ointment Apply 1 Application topically as needed for irritation.    [provider]  metoprolol tartrate (LOPRESSOR) 50 MG tablet Take 50 mg by mouth 2 (two) times daily.    [provider]  Multiple Minerals-Vitamins (CALCIUM-MAGNESIUM-ZINC-D3) TABS Take 1 tablet by mouth in the morning and at bedtime.    [provider]  Multiple Vitamin (MULTIVITAMIN WITH MINERALS) TABS tablet Take 1 tablet by mouth daily in the afternoon.    [provider]  nystatin cream (MYCOSTATIN) Apply 1 Application topically 2 (two) times daily. 05/01/23   [provider]  ondansetron (ZOFRAN ODT) 4 MG disintegrating tablet Take 1 tablet (4 mg total) by mouth every 8 (eight) hours as needed for nausea or vomiting. Patient not taking: Reported on 05/18/2023 10/19/20   Pricilla Loveless, MD  ondansetron (ZOFRAN) 4 MG tablet Take 4 mg by mouth every 8 (eight) hours as needed for nausea or vomiting. 03/14/23   [provider]  oxybutynin (DITROPAN) 5 MG tablet Take 5 mg by mouth 2 (two) times daily. 10/03/19   [provider]  pantoprazole (PROTONIX) 40 MG tablet Take 1 tablet (40 mg total) by mouth daily for 15 days. Patient not  taking: Reported on 05/18/2023 09/24/21 04/27/22  Pokhrel, Rebekah Chesterfield, MD  rosuvastatin (CRESTOR) 20 MG tablet Take 20 mg by mouth in the morning. 09/10/18   [provider]  verapamil (CALAN-SR) 120 MG CR tablet Take 120 mg by mouth at bedtime. 09/26/19   [provider]      Allergies    Latex, Succinylcholine, Ciprofloxacin, Vancomycin, and Adhesive [tape]    Review of Systems   Review of Systems  Physical Exam Updated Vital Signs BP 104/68   Pulse 94   Temp 97.8 F (36.6 C) (Oral)   Resp 12   SpO2 98%  Physical  Exam Vitals reviewed.  Constitutional:      Appearance: Normal appearance.  HENT:     Head: Normocephalic.     Right Ear: External ear normal.     Left Ear: External ear normal.     Nose: Nose normal.     Mouth/Throat:     Pharynx: Oropharynx is clear.  Eyes:     Pupils: Pupils are equal, round, and reactive to light.  Cardiovascular:     Rate and Rhythm: Normal rate and regular rhythm.     Pulses: Normal pulses.  Pulmonary:     Effort: Pulmonary effort is normal.  Abdominal:     Palpations: Abdomen is soft.  Genitourinary:    Comments: Suprapubic catheter in place There is some skin erythema and breakdown on the scrotum  Musculoskeletal:     Cervical back: Normal range of motion.  Skin:    General: Skin is warm and dry.     Capillary Refill: Capillary refill takes less than 2 seconds.  Neurological:     General: No focal deficit present.     Mental Status: He is alert.  Psychiatric:        Mood and Affect: Mood normal.     ED Results / Procedures / Treatments   Labs (all labs ordered are listed, but only abnormal results are displayed) Labs Reviewed  CBC WITH DIFFERENTIAL/PLATELET - Abnormal; Notable for the following components:      Result Value   MCH 24.4 (*)    Monocytes Absolute 1.2 (*)    All other components within normal limits  COMPREHENSIVE METABOLIC PANEL - Abnormal; Notable for the following components:   Sodium 132 (*)    Potassium 3.0 (*)    Chloride 93 (*)    Creatinine, Ser 0.45 (*)    Calcium 8.4 (*)    Albumin 3.4 (*)    All other components within normal limits  CBG MONITORING, ED - Abnormal; Notable for the following components:   Glucose-Capillary 143 (*)    All other components within normal limits  URINALYSIS, ROUTINE W REFLEX MICROSCOPIC    EKG None EKG reviewed and interpreted significant for a rate of 91 with nonspecific ST changes no evidence of acute abnormality Radiology DG Abd 1 View  Result Date: 05/23/2023 CLINICAL  DATA:  Possible shunt malfunction. Recent rapid response today with loss of consciousness. Headache, blurred vision and possible seizure. EXAM: ABDOMEN - 1 VIEW COMPARISON:  01/12/2021 FINDINGS: Tip of patient's right-sided ventriculostomy catheter is seen over the right upper quadrant unchanged. There is diffuse decreased bone mineralization. Mildly prominent air-filled loop of bowel over the right pelvis in addition to an air-filled loop of bowel low in the pelvis right of midline which may be within a hernia. No definite free peritoneal air. Fracture of the left side of patient's spinal stabilization hardware over the level of the  lower thoracic spine unchanged. Degenerative changes of the hips. Remainder of the exam is unchanged. IMPRESSION: 1. Mildly prominent air-filled loop of bowel over the right pelvis in addition to an additional air-filled loop of probable small bowel bowel low in the pelvis right of midline which may be within a hernia. Recommend clinical correlation and consider CT if clinically indicated. 2. Right-sided ventriculostomy catheter unchanged with tip over the right upper quadrant. Electronically Signed   By: Elberta Fortis M.D.   On: 05/23/2023 15:10   DG Skull 1-3 Views  Result Date: 05/23/2023 CLINICAL DATA:  Possible shunt malfunction. Rapid response with loss of consciousness today. Headache and blurred vision with possible seizure. EXAM: SKULL - 1-3 VIEW COMPARISON:  01/12/2021 FINDINGS: Patient's right-sided ventriculostomy catheter is unchanged and intact from the visualized skull to the neck. Remainder of the exam is unchanged. IMPRESSION: Right-sided ventriculostomy catheter unchanged. Electronically Signed   By: Elberta Fortis M.D.   On: 05/23/2023 15:07   DG Chest 1 View  Result Date: 05/23/2023 CLINICAL DATA:  Shunt malfunction.  Possible seizure. EXAM: CHEST  1 VIEW COMPARISON:  Chest radiograph 01/12/2021 FINDINGS: A shunt catheter extending from the right neck into the  right chest to the level of the SVC is unchanged and appears intact. An abandoned catheter fragment is again noted more laterally in the right chest. The cardiomediastinal silhouette is unchanged. Lung volumes are low without gross airspace consolidation, edema, sizable pleural effusion, or pneumothorax. Prior spinal fusion is noted. IMPRESSION: 1. Thoracic portion of the shunt appears intact, terminating over the SVC. 2. Low lung volumes. Electronically Signed   By: Sebastian Ache M.D.   On: 05/23/2023 15:07   DG Cervical Spine 1 View  Result Date: 05/23/2023 CLINICAL DATA:  Possible shunt malfunction. Headache with blurred vision and possible seizure. Recent loss of conscious with rapid response. EXAM: DG CERVICAL SPINE - 1 VIEW COMPARISON:  12/23/2015, skull series 01/12/2021 FINDINGS: Old VP shunt fragment over the soft tissues of the right neck unchanged. Visualized segments of patient's current right-sided ventriculoperitoneal shunt is intact and unchanged over the neck and upper thorax. Remainder of the exam is unchanged. IMPRESSION: 1. No acute findings. 2. Right-sided ventriculoperitoneal shunt unchanged. Electronically Signed   By: Elberta Fortis M.D.   On: 05/23/2023 15:05   CT Head Wo Contrast  Result Date: 05/23/2023 CLINICAL DATA:  Altered mental status EXAM: CT HEAD WITHOUT CONTRAST TECHNIQUE: Contiguous axial images were obtained from the base of the skull through the vertex without intravenous contrast. RADIATION DOSE REDUCTION: This exam was performed according to the departmental dose-optimization program which includes automated exposure control, adjustment of the mA and/or kV according to patient size and/or use of iterative reconstruction technique. COMPARISON:  CT Head 01/12/21, 09/22/21 FINDINGS: Brain: Right parietal approach ventriculostomy catheter with tip positioned near the left frontal horn. Size and shape of the ventricular system is unchanged compared to prior exams. No  hydrocephalus. No extra-axial fluid collection. No hemorrhage. No CT evidence of an acute cortical infarct. 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 Vascular: No hyperdense vessel or unexpected calcification. Skull: Normal. Negative for fracture or focal lesion. Sinuses/Orbits: 2 no middle ear or mastoid effusion. Paranasal sinuses clear. Orbits are unremarkable. Other: None. IMPRESSION: 1. No acute intracranial abnormality. 2. Right parietal approach ventriculostomy catheter with tip positioned near the left frontal horn. Size and shape of the ventricular system is unchanged compared to prior exams.  No hydrocephalus. 3. 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 Electronically Signed   By: Lorenza Cambridge M.D.   On: 05/23/2023 15:05    Procedures Procedures    Medications Ordered in ED Medications  potassium chloride (KLOR-CON) packet 40 mEq (40 mEq Oral Given 05/23/23 1454)    ED Course/ Medical Decision Making/ A&P Clinical Course as of 05/23/23 1544  Tue May 23, 2023  1414 CBC reviewed interpreted within normal limits Complete metabolic panel reviewed panel is reviewed interpreted significant for mild hyponatremia sodium 132, hypokalemia potassium 3.0 normal renal function CBG obtained in blood sugar at 143 [DR]  1415 DG Cervical Spine 1 View [DR]  1415 DG Skull 1-3 Views [DR]  1535 CT head reviewed and no obvious abnormalities noted on my interpretation radiologist interpretation notes that VP shunt is in place prior noted Chiari malformation without acute abnormalities noted [DR]  1536 Shunt series without evidence of acute shunt abnormality [DR]    Clinical Course User Index [DR] Margarita Grizzle, MD                                 Medical Decision Making Amount and/or Complexity of Data  Reviewed Labs: ordered. Radiology: ordered.  Risk Prescription drug management.   42 yo male with spina bifida, shunt presents with episode of staring into space DDX includes but not limited to acute intraccranial event such as bleeding or stroke, shunt obstruction, infection, seizure including absence seizures, electrolyte abnormalities, metabolic abnormality such as hypoglycemia, or cardiac etiology Patient was evaluated here with CBC that is normal, complete metabolic panel that shows mild hyponatremia and hypokalemia and normal blood sugar. CT head shows no evidence of shunt malfunction or bleeding Discussed the findings with the patient and his mother.  They wish to be discharged at this time.  The urinalysis has not returned yet.  They voiced understand they will need to follow-up on this and have his primary care doctor follow-up on the urinalysis. Plan referral to neurology.  We also discussed that if the patient is having any return of symptoms they should return to the emergency department.         Final Clinical Impression(s) / ED Diagnoses Final diagnoses:  Transient alteration of awareness    Rx / DC Orders ED Discharge Orders     None         Margarita Grizzle, MD 05/23/23 1544

## 2023-06-02 ENCOUNTER — Ambulatory Visit (HOSPITAL_COMMUNITY): Payer: 59 | Admitting: Physician Assistant

## 2023-06-02 ENCOUNTER — Encounter (HOSPITAL_COMMUNITY)
Admission: RE | Disposition: A | Discharge status: Home / Self Care | Payer: Self-pay | Source: Ambulatory Visit | Attending: Urology

## 2023-06-02 ENCOUNTER — Ambulatory Visit (HOSPITAL_COMMUNITY): Payer: 59

## 2023-06-02 ENCOUNTER — Ambulatory Visit (HOSPITAL_COMMUNITY)
Admission: RE | Admit: 2023-06-02 | Discharge: 2023-06-02 | Disposition: A | Discharge status: Home / Self Care | Payer: 59 | Source: Ambulatory Visit | Attending: Urology | Admitting: Urology

## 2023-06-02 ENCOUNTER — Other Ambulatory Visit: Payer: Self-pay

## 2023-06-02 ENCOUNTER — Encounter (HOSPITAL_COMMUNITY): Payer: Self-pay | Admitting: Urology

## 2023-06-02 DIAGNOSIS — G8220 Paraplegia, unspecified: Secondary | ICD-10-CM | POA: Insufficient documentation

## 2023-06-02 DIAGNOSIS — Q057 Lumbar spina bifida without hydrocephalus: Secondary | ICD-10-CM | POA: Insufficient documentation

## 2023-06-02 DIAGNOSIS — I10 Essential (primary) hypertension: Secondary | ICD-10-CM | POA: Diagnosis not present

## 2023-06-02 DIAGNOSIS — Z09 Encounter for follow-up examination after completed treatment for conditions other than malignant neoplasm: Secondary | ICD-10-CM | POA: Diagnosis not present

## 2023-06-02 DIAGNOSIS — N319 Neuromuscular dysfunction of bladder, unspecified: Secondary | ICD-10-CM

## 2023-06-02 DIAGNOSIS — Z79899 Other long term (current) drug therapy: Secondary | ICD-10-CM | POA: Insufficient documentation

## 2023-06-02 DIAGNOSIS — R310 Gross hematuria: Secondary | ICD-10-CM | POA: Diagnosis not present

## 2023-06-02 DIAGNOSIS — Z87442 Personal history of urinary calculi: Secondary | ICD-10-CM | POA: Insufficient documentation

## 2023-06-02 HISTORY — PX: INSERTION OF SUPRAPUBIC CATHETER: SHX5870

## 2023-06-02 HISTORY — PX: BOTOX INJECTION: SHX5754

## 2023-06-02 SURGERY — BOTOX INJECTION
Anesthesia: Monitor Anesthesia Care

## 2023-06-02 MED ORDER — ONDANSETRON HCL 4 MG/2ML IJ SOLN
INTRAMUSCULAR | Status: AC
  Filled 2023-06-02: qty 2

## 2023-06-02 MED ORDER — LIDOCAINE HCL (CARDIAC) PF 100 MG/5ML IV SOSY
PREFILLED_SYRINGE | INTRAVENOUS | Status: DC | PRN
Start: 2023-06-02 — End: 2023-06-02
  Administered 2023-06-02: 60 mg via INTRATRACHEAL

## 2023-06-02 MED ORDER — LACTATED RINGERS IV SOLN
INTRAVENOUS | Status: DC | PRN
Start: 2023-06-02 — End: 2023-06-02

## 2023-06-02 MED ORDER — PROPOFOL 10 MG/ML IV BOLUS
INTRAVENOUS | Status: AC
  Filled 2023-06-02: qty 20

## 2023-06-02 MED ORDER — SODIUM CHLORIDE 0.9 % IV SOLN
1.0000 g | Freq: Once | INTRAVENOUS | Status: AC
  Administered 2023-06-02: 1 g via INTRAVENOUS
  Filled 2023-06-02: qty 20

## 2023-06-02 MED ORDER — CHLORHEXIDINE GLUCONATE 0.12 % MT SOLN
15.0000 mL | Freq: Once | OROMUCOSAL | Status: AC
  Administered 2023-06-02: 15 mL via OROMUCOSAL

## 2023-06-02 MED ORDER — DROPERIDOL 2.5 MG/ML IJ SOLN: 0.6250 mg | Freq: Once | INTRAMUSCULAR | Status: DC | PRN

## 2023-06-02 MED ORDER — ORAL CARE MOUTH RINSE: 15.0000 mL | Freq: Once | OROMUCOSAL | Status: AC

## 2023-06-02 MED ORDER — ONABOTULINUMTOXINA 100 UNITS IJ SOLR
INTRAMUSCULAR | Status: DC | PRN
  Administered 2023-06-02: 200 [IU] via INTRAMUSCULAR

## 2023-06-02 MED ORDER — LACTATED RINGERS IV SOLN: INTRAVENOUS | Status: DC

## 2023-06-02 MED ORDER — ONABOTULINUMTOXINA 100 UNITS IJ SOLR
INTRAMUSCULAR | Status: AC
  Filled 2023-06-02: qty 100

## 2023-06-02 MED ORDER — STERILE WATER FOR IRRIGATION IR SOLN
Status: DC | PRN
  Administered 2023-06-02: 500 mL

## 2023-06-02 MED ORDER — DEXAMETHASONE SODIUM PHOSPHATE 10 MG/ML IJ SOLN
INTRAMUSCULAR | Status: AC
  Filled 2023-06-02: qty 1

## 2023-06-02 MED ORDER — ONDANSETRON HCL 4 MG/2ML IJ SOLN
INTRAMUSCULAR | Status: DC | PRN
  Administered 2023-06-02: 4 mg via INTRAVENOUS

## 2023-06-02 MED ORDER — SODIUM CHLORIDE (PF) 0.9 % IJ SOLN
INTRAMUSCULAR | Status: AC
  Filled 2023-06-02: qty 10

## 2023-06-02 MED ORDER — PROPOFOL 10 MG/ML IV BOLUS
INTRAVENOUS | Status: DC | PRN
Start: 2023-06-02 — End: 2023-06-02
  Administered 2023-06-02 (×2): 20 mg via INTRAVENOUS

## 2023-06-02 MED ORDER — PROPOFOL 500 MG/50ML IV EMUL
INTRAVENOUS | Status: DC | PRN
  Administered 2023-06-02: 40 ug/kg/min via INTRAVENOUS

## 2023-06-02 MED ORDER — SODIUM CHLORIDE 0.9 % IR SOLN
Status: DC | PRN
Start: 2023-06-02 — End: 2023-06-02
  Administered 2023-06-02: 3000 mL via INTRAVESICAL

## 2023-06-02 MED ORDER — FENTANYL CITRATE PF 50 MCG/ML IJ SOSY: 25.0000 ug | PREFILLED_SYRINGE | INTRAMUSCULAR | Status: DC | PRN

## 2023-06-02 MED ORDER — MIDAZOLAM HCL 2 MG/2ML IJ SOLN
INTRAMUSCULAR | Status: AC
  Filled 2023-06-02: qty 2

## 2023-06-02 MED ORDER — MIDAZOLAM HCL 5 MG/5ML IJ SOLN
INTRAMUSCULAR | Status: DC | PRN
  Administered 2023-06-02: 2 mg via INTRAVENOUS

## 2023-06-02 SURGICAL SUPPLY — 40 items
BAG COUNTER SPONGE SURGICOUNT (BAG) IMPLANT
BAG DRN RND TRDRP ANRFLXCHMBR (UROLOGICAL SUPPLIES) ×1
BAG SPNG CNTER NS LX DISP (BAG)
BAG URINE DRAIN 2000ML AR STRL (UROLOGICAL SUPPLIES) ×1 IMPLANT
BAG URINE LEG 500ML (DRAIN) ×1 IMPLANT
BAG URO CATCHER STRL LF (MISCELLANEOUS) ×1 IMPLANT
BLADE SURG 15 STRL LF DISP TIS (BLADE) ×1 IMPLANT
BLADE SURG 15 STRL SS (BLADE)
CATH FOLEY 2WAY SLVR 5CC 20FR (CATHETERS) ×1 IMPLANT
CATH FOLEY LATEX FREE 22FR (CATHETERS) ×1
CATH FOLEY LF 22FR (CATHETERS) IMPLANT
CATH URETL OPEN 5X70 (CATHETERS) IMPLANT
CLOTH BEACON ORANGE TIMEOUT ST (SAFETY) ×1 IMPLANT
ELECT REM PT RETURN 15FT ADLT (MISCELLANEOUS) ×1 IMPLANT
GLOVE BIO SURGEON STRL SZ7.5 (GLOVE) ×1 IMPLANT
GLOVE SURG LX STRL 7.5 STRW (GLOVE) ×1 IMPLANT
GLOVE SURG SS PI 8.0 STRL IVOR (GLOVE) ×1 IMPLANT
GOWN STRL REUS W/ TWL XL LVL3 (GOWN DISPOSABLE) ×2 IMPLANT
GOWN STRL REUS W/TWL XL LVL3 (GOWN DISPOSABLE) ×2
KIT TURNOVER KIT A (KITS) IMPLANT
LASER FIB FLEXIVA PULSE ID 550 (Laser) IMPLANT
LASER FIB FLEXIVA PULSE ID 910 (Laser) IMPLANT
MANIFOLD NEPTUNE II (INSTRUMENTS) ×1 IMPLANT
NDL ASPIRATION 22 (NEEDLE) ×1 IMPLANT
NDL HYPO 22X1.5 SAFETY MO (MISCELLANEOUS) ×1 IMPLANT
NDL SAFETY ECLIP 18X1.5 (MISCELLANEOUS) IMPLANT
NEEDLE ASPIRATION 22 (NEEDLE) ×1
NEEDLE HYPO 22X1.5 SAFETY MO (MISCELLANEOUS) ×1
PACK CYSTO (CUSTOM PROCEDURE TRAY) ×1 IMPLANT
PENCIL SMOKE EVACUATOR (MISCELLANEOUS) IMPLANT
PLUG CATH AND CAP STRL 200 (CATHETERS) ×1 IMPLANT
SPONGE DRAIN TRACH 4X4 STRL 2S (GAUZE/BANDAGES/DRESSINGS) ×1 IMPLANT
SUT ETHILON 2 0 PS N (SUTURE) ×1 IMPLANT
SYR CONTROL 10ML LL (SYRINGE) IMPLANT
SYR TOOMEY IRRIG 70ML (MISCELLANEOUS)
SYRINGE TOOMEY IRRIG 70ML (MISCELLANEOUS) IMPLANT
TOWEL OR 17X26 10 PK STRL BLUE (TOWEL DISPOSABLE) ×1 IMPLANT
TUBING CONNECTING 10 (TUBING) IMPLANT
TUBING UROLOGY SET (TUBING) ×1 IMPLANT
WATER STERILE IRR 3000ML UROMA (IV SOLUTION) ×1 IMPLANT

## 2023-06-02 NOTE — Op Note (Signed)
Operative Note  Preoperative diagnosis:  1.  Neurogenic bladder  Postoperative diagnosis: 1.  Neurogenic bladder  Procedure(s): 1.  Cystoscopy with 200 units of Botox 2.  Suprapubic tube exchange  Surgeon: Modena Slater, MD  Assistants: None  Anesthesia: General  Complications: None immediate  EBL: Normal  Specimens: 1.  None  Drains/Catheters: 1.  22 French suprapubic tube  Intraoperative findings: 1.  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  Bladder mucosa was without any tumors or stones.  Small capacity bladder.  Indication: 42 year old male with neurogenic bladder managed with suprapubic tube and intra detrusor Botox presents for the previously mentioned operation.  Description of procedure:  The patient was identified and consent was obtained.  The patient was taken to the operating room and placed in the supine position.  The patient was placed under general anesthesia.  Perioperative antibiotics were administered.  The patient was placed in dorsal lithotomy.  Patient was prepped and draped in a standard sterile fashion and a timeout was performed.  A 21 French rigid cystoscope was advanced into the urethra and into the bladder.  Complete cystoscopy was performed with findings noted above.  There were no stones so I withdrew the scope and advanced the Botox cystoscope into the bladder and proceeded to inject 200 units of Botox throughout the detrusor muscle systematically.  There was no significant active bleeding noted.  I placed a 22 French suprapubic tube in place 10 cc of sterile water into the catheter balloon.  It was in good position and I withdrew the scope.  Patient tolerated the procedure well and was stable postoperatively.  Plan: Follow-up in 6 months for repeat Botox.  Continue monthly suprapubic tube exchanges.

## 2023-06-02 NOTE — H&P (Signed)
CC/HPI: This is a patient of Dr. Shannan Harper who is seen on call for his suprapubic tube which is not draining well. His mother can flush it but gets no return and he just leaks from the penis. The tube was He has had a moderate amount of drainage from his penis. He denies fevers or chills. He denies abdominal pain or flank pain. He has some gross hematuria.   He has a NGB and last had botox in 11/22 and had the foley changed on 04/08/22. He has had prior bladder stones.   05/04/2023  Patient doing well with suprapubic tube. Has not had Botox in about a year. Overdue for it. Has not been having any issues as of current.     ALLERGIES: Cipro TABS Contrast Media Ready-Box MISC Latex Gloves MISC Vancomycin HCl SOLR    MEDICATIONS: Antacid  Calcium + D  Cranberry Fruit  Docusate Sodium  Klor-Con 20 meq packet  Metoprolol Tartrate 50 mg tablet Oral  Multi-Day Vitamins tablet Oral  Rosuvastatin Calcium 20 mg tablet  Tylenol Arthritis PRN  Verapamil Hcl 1 PO Daily  Vitamin C  Zofran 4 mg tablet Oral     GU PSH: Catheterization For Collection Of Specimen, Single Patient, All Places Of Service - 2020, 2019, 2019 Catheterize For Residual - 2019, 2017 Compl Change SP Tube - 07/26/2021, 2021, 2021, 2020 Complex cystometrogram, w/ void pressure and urethral pressure profile studies, any technique - 2020 Complex Uroflow - 2020 Cysto Bladder Stone <2.5cm - 05/09/2022, 2021 Cystoscopy - 2021, 2021, 2021 Cystourethroscopy, W/Injection For Chemodenervation Of Bladder - 05/09/2022, 07/26/2021, 2022, 2021, 2021, 2020 Emg surf Electrd - 2020 Inject For cystogram - 2020 Insert Bladder Cath; Complex - 2021, 2021 Intrabd voidng Press - 2020 Simple Change SP Tube - 03/24/2023, 02/17/2023, 01/20/2023, 12/16/2022, 11/11/2022, 10/14/2022, 09/16/2022, 07/08/2022, 06/10/2022, 04/18/2022, 04/08/2022, 03/11/2022, 02/11/2022, 01/07/2022, 12/10/2021, 11/05/2021, 2023, 2023, 09/03/2021, 08/25/2021, 07/08/2021, 06/23/2021, 06/09/2021,  05/27/2021, 05/10/2021, 2022, 2022, 2022, 2022, 2022, 2022, 2022, 2022, 2022, 2022, 2022, 2021, 2021, 2021, 2021, 2021, 2021, 2021, 2021, 2021, 2020, 2020, 2020, 2020       PSH Notes: Ileostomy, Colostomy, Cholecystectomy, Appendectomy   NON-GU PSH: Appendectomy - 2016 Cholecystectomy (open) - 2016 Colostomy - 2016     GU PMH: Bladder, Neuromuscular dysfunction, Unspec - 03/24/2023, - 02/17/2023, - 01/20/2023, - 12/16/2022, - 11/11/2022, - 10/14/2022, - 09/16/2022, - 08/12/2022, - 07/08/2022, - 06/10/2022, He is having more frequent episodes with catheter obstruction. He last had botox in 11/22 and should be considered for reinjection. 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  Drinks 1 caffeinated drink per day.     Notes: Caffeine use, Never smoker, Alcohol use, Disabled, Single, Number of children, Never used tobacco   REVIEW OF SYSTEMS:    GU Review Male:   Patient denies frequent urination, hard to postpone urination, burning/ pain with urination, get up at night to urinate, leakage of urine, stream starts and stops, trouble starting your stream, have to strain to urinate , erection problems, and penile pain.  Gastrointestinal (Upper):   Patient denies nausea, vomiting, and indigestion/ heartburn.  Gastrointestinal (Lower):   Patient denies diarrhea and constipation.  Constitutional:   Patient denies night sweats, fatigue, weight loss, and fever.  Skin:   Patient denies skin rash/ lesion and itching.  Eyes:   Patient denies blurred vision and double vision.  Ears/ Nose/ Throat:   Patient denies sore throat and sinus problems.  Hematologic/Lymphatic:   Patient denies swollen glands and easy bruising.  Cardiovascular:   Patient denies leg swelling and chest pains.  Respiratory:   Patient denies  cough and shortness of breath.  Endocrine:   Patient denies excessive thirst.  Musculoskeletal:   Patient denies back pain and joint pain.  Neurological:   Patient denies headaches and dizziness.  Psychologic:   Patient denies depression and anxiety.   VITAL SIGNS: None   MULTI-SYSTEM PHYSICAL EXAMINATION:    Constitutional: Well-nourished.      Complexity of Data:  Source Of History:  Patient  Records Review:   Previous Doctor Records, Previous Patient Records   PROCEDURES:         Simple Change SP Tube - 51705  The patient's indwelling SP tube was carefully removed. A 22 French Foley catheter was inserted into the bladder using sterile technique. 10cc balloon today. The patient was taught routine catheter care. Hand irrigation of the bladder with sterile water was performed. A bedside bag was connected.         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 Possible cystolitholapaxy given his history of bladder stones.   CC: Dr. Awilda Metro  Signed by Modena Slater, III, M.D. on 05/04/23 at 1:27 PM (EDT

## 2023-06-02 NOTE — Anesthesia Postprocedure Evaluation (Signed)
Anesthesia Post Note  Patient: [name]  Procedure(s) Performed: CYSTOSCOPY BOTOX INJECTION 200 UNITS SUPRAPUBIC CATHETER CHANGE     Patient location during evaluation: PACU Anesthesia Type: MAC Level of consciousness: awake and alert Pain management: pain level controlled Vital Signs Assessment: post-procedure vital signs reviewed and stable Respiratory status: spontaneous breathing, nonlabored ventilation, respiratory function stable and patient connected to nasal cannula oxygen Cardiovascular status: stable and blood pressure returned to baseline Postop Assessment: no apparent nausea or vomiting Anesthetic complications: no   No notable events documented.  Last Vitals:  Vitals:   06/02/23 1400 06/02/23 1417  BP: 117/71 130/85  Pulse: 75 78  Resp: 18   Temp:  36.6 C  SpO2: 99% 100%    Last Pain:  Vitals:   06/02/23 1417  TempSrc: Oral  PainSc: 0-No pain                 [place]

## 2023-06-02 NOTE — Progress Notes (Signed)
Pharmacy: Meropenem for pre-op prophylaxis   Plan: Meropenem 1 gm IV 1 dose preop Pharmacy to sign off  Herby Abraham, Pharm.D Use secure chat for questions 06/02/2023 10:35 AM

## 2023-06-02 NOTE — Transfer of Care (Signed)
Immediate Anesthesia Transfer of Care Note  Patient: [name]  Procedure(s) Performed: CYSTOSCOPY BOTOX INJECTION 200 UNITS SUPRAPUBIC CATHETER CHANGE  Patient Location: PACU  Anesthesia Type:MAC  Level of Consciousness: awake and alert   Airway & Oxygen Therapy: Patient Spontanous Breathing and Patient connected to nasal cannula oxygen  Post-op Assessment: Report given to RN and Post -op Vital signs reviewed and stable  Post vital signs: Reviewed and stable  Last Vitals:  Vitals Value Taken Time  BP 130/85 06/02/23 1417  Temp 36.6 C 06/02/23 1417  Pulse 85 06/02/23 1426  Resp 14 06/02/23 1413  SpO2 100 % 06/02/23 1426  Vitals shown include unfiled device data.  Last Pain:  Vitals:   06/02/23 1417  TempSrc: Oral  PainSc: 0-No pain      Patients Stated Pain Goal: 6 (06/02/23 1036)  Complications: No notable events documented.

## 2023-06-02 NOTE — Anesthesia Preprocedure Evaluation (Addendum)
Anesthesia Evaluation  Patient identified by MRN, date of birth, ID band Patient awake    Reviewed: Allergy & Precautions, H&P , NPO status , Patient's Chart, lab work & pertinent test results, reviewed documented beta blocker date and time   History of Anesthesia Complications Negative for: history of anesthetic complications  Airway Mallampati: III  TM Distance: >3 FB Neck ROM: Full    Dental  (+) Poor Dentition, Chipped, Missing   Pulmonary neg pulmonary ROS   Pulmonary exam normal        Cardiovascular hypertension, Pt. on home beta blockers and Pt. on medications Normal cardiovascular exam     Neuro/Psych  Headaches, Seizures - (none since age 42), Well Controlled,   Spina bifida Paraplegic    negative psych ROS   GI/Hepatic Neg liver ROS,GERD  Controlled,,  Endo/Other  negative endocrine ROS    Renal/GU Renal disease    Neurogenic bladder  negative genitourinary   Musculoskeletal negative musculoskeletal ROS (+)    Abdominal   Peds negative pediatric ROS (+)  Hematology  (+) Blood dyscrasia, anemia   Anesthesia Other Findings Hx prolonged paralysis following succinylcholine   Reproductive/Obstetrics negative OB ROS                              Anesthesia Physical Anesthesia Plan  ASA: 3  Anesthesia Plan: MAC   Post-op Pain Management:    Induction: Intravenous  PONV Risk Score and Plan: 1 and Propofol infusion and Treatment may vary due to age or medical condition  Airway Management Planned: Natural Airway, Simple Face Mask and Nasal Cannula  Additional Equipment: None  Intra-op Plan:   Post-operative Plan:   Informed Consent: I have reviewed the patients History and Physical, chart, labs and discussed the procedure including the risks, benefits and alternatives for the proposed anesthesia with the patient or authorized representative who has indicated his/her  understanding and acceptance.     Dental advisory given  Plan Discussed with: Anesthesiologist and CRNA  Anesthesia Plan Comments:          Anesthesia Quick Evaluation

## 2023-06-03 ENCOUNTER — Encounter (HOSPITAL_COMMUNITY): Payer: Self-pay | Admitting: Urology

## 2023-08-15 ENCOUNTER — Inpatient Hospital Stay (HOSPITAL_COMMUNITY)
Admission: EM | Admit: 2023-08-15 | Discharge: 2023-08-17 | DRG: 641 | Disposition: A | Discharge status: Home / Self Care | Payer: 59 | Attending: Family Medicine | Admitting: Family Medicine

## 2023-08-15 ENCOUNTER — Emergency Department (HOSPITAL_COMMUNITY): Payer: 59

## 2023-08-15 ENCOUNTER — Other Ambulatory Visit: Payer: Self-pay

## 2023-08-15 ENCOUNTER — Encounter (HOSPITAL_COMMUNITY): Payer: Self-pay

## 2023-08-15 DIAGNOSIS — Z9049 Acquired absence of other specified parts of digestive tract: Secondary | ICD-10-CM

## 2023-08-15 DIAGNOSIS — Z809 Family history of malignant neoplasm, unspecified: Secondary | ICD-10-CM

## 2023-08-15 DIAGNOSIS — N319 Neuromuscular dysfunction of bladder, unspecified: Secondary | ICD-10-CM | POA: Diagnosis present

## 2023-08-15 DIAGNOSIS — Q0703 Arnold-Chiari syndrome with spina bifida and hydrocephalus: Secondary | ICD-10-CM

## 2023-08-15 DIAGNOSIS — Z91040 Latex allergy status: Secondary | ICD-10-CM

## 2023-08-15 DIAGNOSIS — E669 Obesity, unspecified: Secondary | ICD-10-CM | POA: Diagnosis present

## 2023-08-15 DIAGNOSIS — N179 Acute kidney failure, unspecified: Secondary | ICD-10-CM | POA: Diagnosis present

## 2023-08-15 DIAGNOSIS — K439 Ventral hernia without obstruction or gangrene: Secondary | ICD-10-CM | POA: Diagnosis present

## 2023-08-15 DIAGNOSIS — Z87898 Personal history of other specified conditions: Secondary | ICD-10-CM

## 2023-08-15 DIAGNOSIS — G808 Other cerebral palsy: Secondary | ICD-10-CM | POA: Diagnosis present

## 2023-08-15 DIAGNOSIS — Z993 Dependence on wheelchair: Secondary | ICD-10-CM

## 2023-08-15 DIAGNOSIS — K5900 Constipation, unspecified: Secondary | ICD-10-CM

## 2023-08-15 DIAGNOSIS — Z6830 Body mass index (BMI) 30.0-30.9, adult: Secondary | ICD-10-CM

## 2023-08-15 DIAGNOSIS — Z982 Presence of cerebrospinal fluid drainage device: Secondary | ICD-10-CM | POA: Diagnosis not present

## 2023-08-15 DIAGNOSIS — E860 Dehydration: Secondary | ICD-10-CM | POA: Diagnosis not present

## 2023-08-15 DIAGNOSIS — I10 Essential (primary) hypertension: Secondary | ICD-10-CM | POA: Diagnosis not present

## 2023-08-15 DIAGNOSIS — Z432 Encounter for attention to ileostomy: Secondary | ICD-10-CM

## 2023-08-15 DIAGNOSIS — Z79899 Other long term (current) drug therapy: Secondary | ICD-10-CM

## 2023-08-15 DIAGNOSIS — Z932 Ileostomy status: Secondary | ICD-10-CM

## 2023-08-15 DIAGNOSIS — G40909 Epilepsy, unspecified, not intractable, without status epilepticus: Secondary | ICD-10-CM | POA: Diagnosis present

## 2023-08-15 DIAGNOSIS — Z823 Family history of stroke: Secondary | ICD-10-CM

## 2023-08-15 DIAGNOSIS — R0902 Hypoxemia: Secondary | ICD-10-CM | POA: Diagnosis present

## 2023-08-15 DIAGNOSIS — Z8249 Family history of ischemic heart disease and other diseases of the circulatory system: Secondary | ICD-10-CM

## 2023-08-15 DIAGNOSIS — Z881 Allergy status to other antibiotic agents status: Secondary | ICD-10-CM

## 2023-08-15 DIAGNOSIS — E871 Hypo-osmolality and hyponatremia: Principal | ICD-10-CM | POA: Diagnosis present

## 2023-08-15 DIAGNOSIS — Z888 Allergy status to other drugs, medicaments and biological substances status: Secondary | ICD-10-CM

## 2023-08-15 DIAGNOSIS — G43909 Migraine, unspecified, not intractable, without status migrainosus: Secondary | ICD-10-CM | POA: Diagnosis present

## 2023-08-15 DIAGNOSIS — Z833 Family history of diabetes mellitus: Secondary | ICD-10-CM

## 2023-08-15 DIAGNOSIS — E876 Hypokalemia: Secondary | ICD-10-CM

## 2023-08-15 DIAGNOSIS — Z91048 Other nonmedicinal substance allergy status: Secondary | ICD-10-CM

## 2023-08-15 DIAGNOSIS — K9413 Enterostomy malfunction: Secondary | ICD-10-CM

## 2023-08-15 DIAGNOSIS — Q059 Spina bifida, unspecified: Secondary | ICD-10-CM

## 2023-08-15 DIAGNOSIS — K219 Gastro-esophageal reflux disease without esophagitis: Secondary | ICD-10-CM | POA: Diagnosis present

## 2023-08-15 LAB — URINALYSIS, ROUTINE W REFLEX MICROSCOPIC
Bilirubin Urine: NEGATIVE
Glucose, UA: NEGATIVE mg/dL
Ketones, ur: 5 mg/dL — AB
Nitrite: NEGATIVE
Protein, ur: 100 mg/dL — AB
Specific Gravity, Urine: 1.015 (ref 1.005–1.030)
WBC, UA: >50 WBC/hpf (ref 0–5)
pH: 7 (ref 5.0–8.0)

## 2023-08-15 LAB — CBC WITH DIFFERENTIAL/PLATELET
Abs Immature Granulocytes: 0.07 10*3/uL (ref 0.00–0.07)
Basophils Absolute: 0 10*3/uL (ref 0.0–0.1)
Basophils Relative: 0 %
Eosinophils Absolute: 0 10*3/uL (ref 0.0–0.5)
Eosinophils Relative: 0 %
HCT: 53.2 % — ABNORMAL HIGH (ref 39.0–52.0)
Hemoglobin: 17 g/dL (ref 13.0–17.0)
Immature Granulocytes: 1 %
Lymphocytes Relative: 12 %
Lymphs Abs: 1.9 10*3/uL (ref 0.7–4.0)
MCH: 25.4 pg — ABNORMAL LOW (ref 26.0–34.0)
MCHC: 32 g/dL (ref 30.0–36.0)
MCV: 79.6 fL — ABNORMAL LOW (ref 80.0–100.0)
Monocytes Absolute: 1.2 10*3/uL — ABNORMAL HIGH (ref 0.1–1.0)
Monocytes Relative: 8 %
Neutro Abs: 11.9 10*3/uL — ABNORMAL HIGH (ref 1.7–7.7)
Neutrophils Relative %: 79 %
Platelets: 300 10*3/uL (ref 150–400)
RBC: 6.68 MIL/uL — ABNORMAL HIGH (ref 4.22–5.81)
RDW: 14.6 % (ref 11.5–15.5)
WBC: 15.1 10*3/uL — ABNORMAL HIGH (ref 4.0–10.5)
nRBC: 0 % (ref 0.0–0.2)

## 2023-08-15 LAB — COMPREHENSIVE METABOLIC PANEL
ALT: 59 U/L — ABNORMAL HIGH (ref 0–44)
AST: 27 U/L (ref 15–41)
Albumin: 3.8 g/dL (ref 3.5–5.0)
Alkaline Phosphatase: 74 U/L (ref 38–126)
Anion gap: 15 (ref 5–15)
BUN: 41 mg/dL — ABNORMAL HIGH (ref 6–20)
CO2: 27 mmol/L (ref 22–32)
Calcium: 8.3 mg/dL — ABNORMAL LOW (ref 8.9–10.3)
Chloride: 80 mmol/L — ABNORMAL LOW (ref 98–111)
Creatinine, Ser: 0.76 mg/dL (ref 0.61–1.24)
GFR, Estimated: >60 mL/min (ref >60)
Glucose, Bld: 131 mg/dL — ABNORMAL HIGH (ref 70–99)
Potassium: 3.1 mmol/L — ABNORMAL LOW (ref 3.5–5.1)
Sodium: 122 mmol/L — ABNORMAL LOW (ref 135–145)
Total Bilirubin: 1.1 mg/dL (ref <1.2)
Total Protein: 7.7 g/dL (ref 6.5–8.1)

## 2023-08-15 LAB — BASIC METABOLIC PANEL
Anion gap: 11 (ref 5–15)
BUN: 21 mg/dL — ABNORMAL HIGH (ref 6–20)
CO2: 30 mmol/L (ref 22–32)
Calcium: 8.1 mg/dL — ABNORMAL LOW (ref 8.9–10.3)
Chloride: 89 mmol/L — ABNORMAL LOW (ref 98–111)
Creatinine, Ser: 0.57 mg/dL — ABNORMAL LOW (ref 0.61–1.24)
GFR, Estimated: >60 mL/min (ref >60)
Glucose, Bld: 160 mg/dL — ABNORMAL HIGH (ref 70–99)
Potassium: 3.1 mmol/L — ABNORMAL LOW (ref 3.5–5.1)
Sodium: 130 mmol/L — ABNORMAL LOW (ref 135–145)

## 2023-08-15 LAB — MAGNESIUM: Magnesium: 1.8 mg/dL (ref 1.7–2.4)

## 2023-08-15 MED ORDER — POTASSIUM CHLORIDE 10 MEQ/100ML IV SOLN
10.0000 meq | INTRAVENOUS | Status: AC
  Administered 2023-08-15 (×2): 10 meq via INTRAVENOUS
  Filled 2023-08-15 (×2): qty 100

## 2023-08-15 MED ORDER — VERAPAMIL HCL ER 120 MG PO TBCR
120.0000 mg | EXTENDED_RELEASE_TABLET | Freq: Every day | ORAL | Status: DC
  Administered 2023-08-16: 120 mg via ORAL
  Filled 2023-08-15: qty 1

## 2023-08-15 MED ORDER — POLYETHYLENE GLYCOL 3350 17 G PO PACK
17.0000 g | PACK | Freq: Two times a day (BID) | ORAL | Status: DC
  Administered 2023-08-16 – 2023-08-17 (×4): 17 g via ORAL
  Filled 2023-08-15 (×4): qty 1

## 2023-08-15 MED ORDER — ENOXAPARIN SODIUM 40 MG/0.4ML IJ SOSY
40.0000 mg | PREFILLED_SYRINGE | INTRAMUSCULAR | Status: DC
  Administered 2023-08-16 (×2): 40 mg via SUBCUTANEOUS
  Filled 2023-08-15 (×2): qty 0.4

## 2023-08-15 MED ORDER — METOPROLOL TARTRATE 25 MG PO TABS
50.0000 mg | ORAL_TABLET | Freq: Two times a day (BID) | ORAL | Status: DC
  Administered 2023-08-15 – 2023-08-17 (×4): 50 mg via ORAL
  Filled 2023-08-15 (×4): qty 2

## 2023-08-15 MED ORDER — SODIUM CHLORIDE 0.9 % IV BOLUS
1000.0000 mL | Freq: Once | INTRAVENOUS | Status: AC
  Administered 2023-08-15: 1000 mL via INTRAVENOUS

## 2023-08-15 MED ORDER — BISACODYL 10 MG RE SUPP
10.0000 mg | Freq: Two times a day (BID) | RECTAL | Status: DC
  Administered 2023-08-16 – 2023-08-17 (×4): 10 mg via RECTAL
  Filled 2023-08-15 (×4): qty 1

## 2023-08-15 MED ORDER — CALCIUM CARBONATE ANTACID 500 MG PO CHEW: 500.0000 mg | CHEWABLE_TABLET | Freq: Two times a day (BID) | ORAL | Status: DC | PRN

## 2023-08-15 MED ORDER — POTASSIUM CHLORIDE CRYS ER 20 MEQ PO TBCR
20.0000 meq | EXTENDED_RELEASE_TABLET | ORAL | Status: DC
  Administered 2023-08-17: 20 meq via ORAL
  Filled 2023-08-15: qty 1

## 2023-08-15 MED ORDER — ROSUVASTATIN CALCIUM 20 MG PO TABS
20.0000 mg | ORAL_TABLET | Freq: Every day | ORAL | Status: DC
  Administered 2023-08-15 – 2023-08-17 (×3): 20 mg via ORAL
  Filled 2023-08-15 (×3): qty 1

## 2023-08-15 MED ORDER — ORAL CARE MOUTH RINSE: 15.0000 mL | OROMUCOSAL | Status: DC | PRN

## 2023-08-15 MED ORDER — CALCIUM GLUCONATE-NACL 1-0.675 GM/50ML-% IV SOLN
1.0000 g | Freq: Once | INTRAVENOUS | Status: AC
  Administered 2023-08-15: 1000 mg via INTRAVENOUS
  Filled 2023-08-15: qty 50

## 2023-08-15 MED ORDER — SODIUM CHLORIDE 3 % IV SOLN
INTRAVENOUS | Status: DC
  Filled 2023-08-15: qty 500

## 2023-08-15 MED ORDER — OXYBUTYNIN CHLORIDE 5 MG PO TABS
5.0000 mg | ORAL_TABLET | Freq: Two times a day (BID) | ORAL | Status: DC
  Administered 2023-08-16 – 2023-08-17 (×4): 5 mg via ORAL
  Filled 2023-08-15 (×4): qty 1

## 2023-08-15 MED ORDER — LEVETIRACETAM 500 MG PO TABS
500.0000 mg | ORAL_TABLET | Freq: Two times a day (BID) | ORAL | Status: DC
  Administered 2023-08-16 – 2023-08-17 (×4): 500 mg via ORAL
  Filled 2023-08-15 (×4): qty 1

## 2023-08-15 MED ORDER — POTASSIUM CHLORIDE CRYS ER 20 MEQ PO TBCR
40.0000 meq | EXTENDED_RELEASE_TABLET | Freq: Once | ORAL | Status: AC
  Administered 2023-08-15: 40 meq via ORAL
  Filled 2023-08-15: qty 2

## 2023-08-15 MED ORDER — DOCUSATE SODIUM 100 MG PO CAPS
100.0000 mg | ORAL_CAPSULE | Freq: Two times a day (BID) | ORAL | Status: DC
  Administered 2023-08-16 – 2023-08-17 (×4): 100 mg via ORAL
  Filled 2023-08-15 (×4): qty 1

## 2023-08-15 MED ORDER — CHLORHEXIDINE GLUCONATE CLOTH 2 % EX PADS
6.0000 | MEDICATED_PAD | Freq: Every day | CUTANEOUS | Status: DC
  Administered 2023-08-16 – 2023-08-17 (×2): 6 via TOPICAL

## 2023-08-15 NOTE — Consult Note (Signed)
Consult Note  [name] 09/19/1980  [id number].    Requesting MD: Dr. Lynelle Doctor Chief Complaint/Reason for Consult: low ileostomy output  HPI:  42 y.o. male with medical history significant for congenital paraplegia, spina bifida, hypertension, seizures, neurogenic bowel, status post VP shunt, complex abdominal surgery history as below now with ileostomy who presented to [facility], ED with decreased appetite, abdominal distention, decreased ileostomy output.  Bloating and associated nausea began yesterday for which he took Zofran.  He has had decreased appetite.  Mother inserted a dilator into his stoma which has provided relief and improved output in the past which initially helped but distention and lethargy has worsened today and mother became concerned about dehydration, prompting presentation to ED.  He also tried senna and gas medication at home.  He denies vomiting, fever.  He has had similar episodes of distention in the past for which his mother has dilated his stoma and provided relief.  Prior abdominal surgical history consists of a laparoscopic cholecystectomy at [facility] in 2014 followed by hepatic flexure colonic perforation for which he received a right hemicolectomy and was left in discontinuity.  He was transferred to [facility] and had multiple abdominal surgeries ultimately resulting in bowel resection (95 cm of ileum and jejunum resected) creation of ostomy and Vicryl mesh placement.  He was left with 140 cm of small bowel.  He had a [facility] shunt placed.   Substance use: none Allergies: none Blood thinners: none   ROS: Reviewed and as above  Family History  Problem Relation Age of Onset   Diabetes Other    Hypertension Other    Cancer Other    Stroke Other     Past Medical History:  Diagnosis Date   Arnold-Chiari malformation (HCC)    Congenital paraplegia (HCC)    MID-ABDOMIN DOWN  S/P SPINA BIFIDA   GERD (gastroesophageal reflux disease)    Heart  palpitations    SECONDARY TO VP SHUNT DRAIN   History of kidney stones    History of seizure    per pt mother, no seizures since age 33   Hypertension    Ileostomy in place [facility]) 2014   Migraines followed by neurologist @[facility]    taking verapamil   Neurogenic bladder    INCONTINENT -   Neurogenic bowel    S/P VP shunt    Spina bifida with hydrocephalus, lumbar region (HCC)    CONGENITAL--   VP SHUNT in place--  PARALYZIED MID ABDOMEN DOWN   Urinary incontinence with continuous leakage    Wears glasses    Wheelchair bound    CAN TRASFER SELF WITHOUT BOARD    Past Surgical History:  Procedure Laterality Date   BACK SURGERY  infant   "get skin to stretch over his back; spinal bifida"   BACK SURGERY  age 52   scoliosis and  removed hump on his back (kyphosis) and put rods in to stabalize back"   BOTOX INJECTION N/A 11/14/2018   Procedure: BOTOX INJECTION CYSTOSCOPY;  Surgeon: Crista Elliot, MD;  Location: [facility];  Service: Urology;  Laterality: N/A;   BOTOX INJECTION N/A 05/22/2019   Procedure: BOTOX INJECTION;  Surgeon: Crista Elliot, MD;  Location: [facility];  Service: Urology;  Laterality: N/A;   BOTOX INJECTION N/A 11/13/2019   Procedure: CYSTOSCOPY BOTOX INJECTION;  Surgeon: Crista Elliot, MD;  Location: [facility];  Service: Urology;  Laterality: N/A;   BOTOX INJECTION N/A 07/01/2020  Procedure: CYSTOSCOPY, BOTOX INJECTION, BLADDER FULGERATION, SUPRAPUBIC TUBE EXCHANGE;  Surgeon: Rene Paci, MD;  Location: [facility];  Service: Urology;  Laterality: N/A;   BOTOX INJECTION N/A 12/14/2020   Procedure: BOTOX INJECTION 300 UNITS SUPRAPUBIC TUBE CHANGE;  Surgeon: Crista Elliot, MD;  Location: [facility];  Service: Urology;  Laterality: N/A;   BOTOX INJECTION N/A 06/02/2023   Procedure: CYSTOSCOPY BOTOX INJECTION 200 UNITS;  Surgeon: Crista Elliot, MD;  Location: [facility];  Service: Urology;  Laterality: N/A;  1 HR FOR CASE   CHOLECYSTECTOMY N/A 07/06/2013   Procedure:  LAPAROSCOPIC CHOLECYSTECTOMY WITH INTRAOPERATIVE CHOLANGIOGRAM;  Surgeon: Lodema Pilot, DO;  Location: [facility];  Service: General;  Laterality: N/A;   COLON RESECTION N/A 07/08/2013   Procedure: EXPLORATORY LAPAROSCOPY, DIAGNOSTIC LAPAROSCOPY, PARTIAL COLECTOMY, ABDOMINAL WASHOUT, ABDOMINAL WOUND VAC;  Surgeon: Lodema Pilot, DO;  Location: [facility];  Service: General;  Laterality: N/A;   CYSTOSCOPY WITH LITHOLAPAXY N/A 05/09/2022   Procedure: CYSTOSCOPY WITH LITHOLAPAXY, BOTOX INJECTION;  Surgeon: Crista Elliot, MD;  Location: [facility];  Service: Urology;  Laterality: N/A;   HIP SURGERY Bilateral AS CHILD   TENDON RELEASE   HOLMIUM LASER APPLICATION N/A 07/01/2020   Procedure: HOLMIUM LASER OF BLADDER STONE;  Surgeon: Rene Paci, MD;  Location: [facility];  Service: Urology;  Laterality: N/A;   INCISION AND DRAINAGE OF WOUND N/A 11/04/2013   Procedure: IRRIGATION AND DEBRIDEMENT ABDOMINAL WOUND WITH PLACEMENT OF ACELL/VAC;  Surgeon: Wayland Denis, DO;  Location: [facility];  Service: Plastics;  Laterality: N/A;   INSERTION OF SUPRAPUBIC CATHETER N/A 12/23/2019   Procedure: FLEXIBLE CYSTOSCOPE WITH FOLEY PLACEMENT OVER WIRE.;  Surgeon: Noel Christmas, MD;  Location: [facility];  Service: Urology;  Laterality: N/A;   INSERTION OF SUPRAPUBIC CATHETER N/A 07/26/2021   Procedure: EXCHANGE SUPRAPUBIC CATHETER AMD BOTOX INJECTION 300 UNITS;  Surgeon: Crista Elliot, MD;  Location: [facility];  Service: Urology;  Laterality: N/A;   INSERTION OF SUPRAPUBIC CATHETER N/A 06/02/2023   Procedure: SUPRAPUBIC CATHETER CHANGE;  Surgeon: Crista Elliot, MD;  Location: [facility];  Service: Urology;  Laterality: N/A;   IR CATHETER TUBE CHANGE  02/15/2019   IR CATHETER TUBE CHANGE  03/01/2019   IR CATHETER TUBE CHANGE  05/06/2019   IR CATHETER TUBE CHANGE  03/06/2020   IR CATHETER TUBE CHANGE  04/16/2020   IR CATHETER TUBE CHANGE  04/29/2020   ORCHIOPEXY Bilateral AS CHILD   UNDESCENDED TESTIS    RIGHT COLECTOMY/  APPENDECTOMY/  ILEOSTOMY  NOV 2014  [facility]   surgical complication with cholecystectomy   UMBILICAL HERNIA REPAIR  child   VENTRICULOPERITONEAL SHUNT  11-12-2018 per pt and mother---LAST REVISION ~ 06/2015   DUE TO ABD WOUND--- SHUNT DRAINS TO HEART (12/23/2015)    Social History:  reports that he has never smoked. He has never been exposed to tobacco smoke. He has never used smokeless tobacco. He reports that he does not drink alcohol and does not use drugs.  Allergies:  Allergies  Allergen Reactions   Latex Hives   Succinylcholine Rash    Other reaction(s): Other (See Comments) Pt is half [place] - prolonged paralysis due to compromised metabolism. Succinylcholine given on 07/07/15 at outside hospital and pt experienced paralysis > 3 hours.  Due to being native [place], his mother states that he takes longer to "come out of it"   Ciprofloxacin Other (See Comments)    IV only-- caused burning in  arm    Vancomycin Itching and Swelling   Adhesive [Tape] Hives and Rash    (Not in a hospital admission)   Blood pressure 121/72, pulse 100, temperature 97.7 F (36.5 C), temperature source Oral, resp. rate 17, height 5\' 2"  (1.575 m), weight 74.4 kg, SpO2 98%. Physical Exam: General: pleasant male who is laying in bed in NAD HEENT: Sclera are noninjected.  Pupils equal and round. EOMs intact.  Ears and nose without any masses or lesions.  Mouth is pink and moist Heart: regular, rate, and rhythm. Lungs:  Respiratory effort nonlabored Abd: soft, large ventral and parastomal hernias which are soft.  Abdomen is mildly distended.  Nontender to palpation MSK: no edema of BLE or BUE Skin: warm and dry with no masses, lesions, or rashes Psych: A&Ox3 with an appropriate affect.    Results for orders placed or performed during the hospital encounter of 08/15/23 (from the past 48 hour(s))  Comprehensive metabolic panel     Status: Abnormal   Collection Time: 08/15/23  12:47 PM  Result Value Ref Range   Sodium 122 (L) 135 - 145 mmol/L   Potassium 3.1 (L) 3.5 - 5.1 mmol/L   Chloride 80 (L) 98 - 111 mmol/L   CO2 27 22 - 32 mmol/L   Glucose, Bld 131 (H) 70 - 99 mg/dL    Comment: Glucose reference range applies only to samples taken after fasting for at least 8 hours.   BUN 41 (H) 6 - 20 mg/dL   Creatinine, Ser 3.32 0.61 - 1.24 mg/dL   Calcium 8.3 (L) 8.9 - 10.3 mg/dL   Total Protein 7.7 6.5 - 8.1 g/dL   Albumin 3.8 3.5 - 5.0 g/dL   AST 27 15 - 41 U/L   ALT 59 (H) 0 - 44 U/L   Alkaline Phosphatase 74 38 - 126 U/L   Total Bilirubin 1.1 <1.2 mg/dL   GFR, Estimated >95 >18 mL/min    Comment: (NOTE) Calculated using the CKD-EPI Creatinine Equation (2021)    Anion gap 15 5 - 15    Comment: Performed at [facility], [address], [place], [place] [address]  Urinalysis, Routine w reflex microscopic -Urine, Catheterized     Status: Abnormal   Collection Time: 08/15/23  1:10 PM  Result Value Ref Range   Color, Urine YELLOW YELLOW   APPearance CLOUDY (A) CLEAR   Specific Gravity, Urine 1.015 1.005 - 1.030   pH 7.0 5.0 - 8.0   Glucose, UA NEGATIVE NEGATIVE mg/dL   Hgb urine dipstick SMALL (A) NEGATIVE   Bilirubin Urine NEGATIVE NEGATIVE   Ketones, ur 5 (A) NEGATIVE mg/dL   Protein, ur 063 (A) NEGATIVE mg/dL   Nitrite NEGATIVE NEGATIVE   Leukocytes,Ua LARGE (A) NEGATIVE   RBC / HPF 11-20 0 - 5 RBC/hpf   WBC, UA >50 0 - 5 WBC/hpf   Bacteria, UA RARE (A) NONE SEEN   Squamous Epithelial / HPF 6-10 0 - 5 /HPF   WBC Clumps PRESENT    Mucus PRESENT    Hyaline Casts, UA PRESENT    Triple Phosphate Crystal PRESENT     Comment: Performed at [facility], [address] [place], [place] [address]   CT ABDOMEN PELVIS WO CONTRAST  Result Date: 08/15/2023 CLINICAL DATA:  Abdominal bloating with nausea and vomiting since yesterday. Suprapubic catheter and ileostomy. Concern for bowel obstruction. EXAM: CT ABDOMEN AND  PELVIS WITHOUT CONTRAST TECHNIQUE: Multidetector CT imaging of the abdomen and pelvis  was performed following the standard protocol without IV contrast. RADIATION DOSE REDUCTION: This exam was performed according to the departmental dose-optimization program which includes automated exposure control, adjustment of the mA and/or kV according to patient size and/or use of iterative reconstruction technique. COMPARISON:  Abdominopelvic CT 01/12/2021 FINDINGS: Lower chest: Clear lung bases. No significant pleural or pericardial effusion. Hepatobiliary: The liver appears stable, without focal abnormality on noncontrast imaging. Stable asymmetric enlargement of the left lobe. Status post cholecystectomy without evidence of significant biliary dilatation. Pancreas: Unremarkable. No pancreatic ductal dilatation or surrounding inflammatory changes. Spleen: Normal in size without focal abnormality. Adrenals/Urinary Tract: Both adrenal glands appear normal. No evidence of urinary tract calculus, suspicious renal lesion or hydronephrosis. Suprapubic bladder catheter remains in place. The bladder is decompressed. Stomach/Bowel: No enteric contrast administered. Again demonstrated is a large broad-based chronic ventral hernia containing portions of the distal stomach and multiple loops of small bowel. The stomach appears unremarkable for its degree of distention. There is mild dilatation of the distal small bowel with fecalization proximal to the right lower quadrant ileostomy. Hartman pouch consisting of nearly the entire colon is unchanged, decompressed. The right colon has been previously resected. Vascular/Lymphatic: There are no enlarged abdominal or pelvic lymph nodes. No significant vascular findings on noncontrast imaging. Reproductive: The prostate gland and seminal vesicles are unremarkable. Other: As above, large chronic ventral hernia containing portions of the stomach and small bowel. No evidence of incarceration. No  ascites or pneumoperitoneum. Musculoskeletal: Stable postsurgical changes from thoracolumbar spinal fixation with underlying spinal dysraphism. No acute osseous findings are evident. Severe diffuse muscular atrophy. Unchanged ventricular peritoneal shunt tubing terminating in the subcutaneous tissues of the anterior right upper quadrant. IMPRESSION: 1. Mild dilatation of the distal small bowel with fecalization proximal to the right lower quadrant ileostomy suggesting bowel stasis. No focal transition point to suggest mechanical obstruction. 2. Large chronic ventral hernia containing portions of the distal stomach and multiple loops of small bowel. No evidence of incarceration. 3. Stable postsurgical changes from right hemicolectomy, ileostomy and long segment Hartman pouch. 4. Suprapubic bladder catheter remains in place. The bladder is decompressed. 5. Stable postsurgical changes from thoracolumbar spinal fixation with underlying spinal dysraphism and severe diffuse muscular atrophy. Electronically Signed   By: Carey Bullocks M.D.   On: 08/15/2023 15:43   CT Head Wo Contrast  Result Date: 08/15/2023 CLINICAL DATA:  Headache vomiting EXAM: CT HEAD WITHOUT CONTRAST TECHNIQUE: Contiguous axial images were obtained from the base of the skull through the vertex without intravenous contrast. RADIATION DOSE REDUCTION: This exam was performed according to the departmental dose-optimization program which includes automated exposure control, adjustment of the mA and/or kV according to patient size and/or use of iterative reconstruction technique. COMPARISON:  Head CT 05/23/2023 FINDINGS: Brain: No acute territorial infarction, hemorrhage, or intracranial mass. Right posterior shunt catheter with tip at the left frontal horn, stable in position. No interval ventricular a large mint. Effaced fourth ventricle as before. 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 Vascular:  No hyperdense vessels.  No unexpected calcification. Skull: No fracture.  Hyperostosis Sinuses/Orbits: No acute finding. Other: None IMPRESSION: 1. No CT evidence for acute intracranial abnormality. 2. Right posterior shunt catheter with tip at or near the left frontal horn, stable in position. Stable ventricle size and configuration with no interval enlargement compared to the comparison study. 3. 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 Electronically Signed   By: Adrian Prows.D.  On: 08/15/2023 15:35   DG Skull 1-3 Views  Result Date: 08/15/2023 CLINICAL DATA:  Possible shunt malfunction headache nausea vomiting EXAM: SKULL - 1-3 VIEW; CHEST  1 VIEW; ABDOMEN - 1 VIEW COMPARISON:  05/23/2023 FINDINGS: Frontal and lateral views of the skull demonstrate similar positioning of right-sided intracranial shunt with radiolucent portion over the right occipital area. Partially visualized abandoned shunt tubing in the right neck. Single-view chest demonstrates intact shunt tubing more medially along the right chest, with the tip positioned over the SVC region. Abandoned shunt fragments at the right neck and slightly more lateral right chest. Hypoventilatory changes. Posterior spinal hardware. Supine views of the abdomen demonstrate posterior spinal hardware with chronic fracture through left-sided hardware. Multiple loops of air distended bowel measuring up to 6.5 cm concerning for obstruction, bowel loops appear to project within the pannus suggesting a hernia. Chronic acetabular dysplasia. IMPRESSION: 1. Intact shunt tubing with tip terminating in the SVC region. Abandoned shunt fragments as discussed above. 2. Multiple loops of air distended bowel measuring up to 6.5 cm concerning for obstruction, bowel loops appear to project within the pannus suggesting a hernia. . Electronically Signed   By: Jasmine Pang M.D.   On: 08/15/2023 15:15   DG Chest 1 View  Result Date:  08/15/2023 CLINICAL DATA:  Possible shunt malfunction headache nausea vomiting EXAM: SKULL - 1-3 VIEW; CHEST  1 VIEW; ABDOMEN - 1 VIEW COMPARISON:  05/23/2023 FINDINGS: Frontal and lateral views of the skull demonstrate similar positioning of right-sided intracranial shunt with radiolucent portion over the right occipital area. Partially visualized abandoned shunt tubing in the right neck. Single-view chest demonstrates intact shunt tubing more medially along the right chest, with the tip positioned over the SVC region. Abandoned shunt fragments at the right neck and slightly more lateral right chest. Hypoventilatory changes. Posterior spinal hardware. Supine views of the abdomen demonstrate posterior spinal hardware with chronic fracture through left-sided hardware. Multiple loops of air distended bowel measuring up to 6.5 cm concerning for obstruction, bowel loops appear to project within the pannus suggesting a hernia. Chronic acetabular dysplasia. IMPRESSION: 1. Intact shunt tubing with tip terminating in the SVC region. Abandoned shunt fragments as discussed above. 2. Multiple loops of air distended bowel measuring up to 6.5 cm concerning for obstruction, bowel loops appear to project within the pannus suggesting a hernia. . Electronically Signed   By: Jasmine Pang M.D.   On: 08/15/2023 15:15   DG Abd 1 View  Result Date: 08/15/2023 CLINICAL DATA:  Possible shunt malfunction headache nausea vomiting EXAM: SKULL - 1-3 VIEW; CHEST  1 VIEW; ABDOMEN - 1 VIEW COMPARISON:  05/23/2023 FINDINGS: Frontal and lateral views of the skull demonstrate similar positioning of right-sided intracranial shunt with radiolucent portion over the right occipital area. Partially visualized abandoned shunt tubing in the right neck. Single-view chest demonstrates intact shunt tubing more medially along the right chest, with the tip positioned over the SVC region. Abandoned shunt fragments at the right neck and slightly more lateral  right chest. Hypoventilatory changes. Posterior spinal hardware. Supine views of the abdomen demonstrate posterior spinal hardware with chronic fracture through left-sided hardware. Multiple loops of air distended bowel measuring up to 6.5 cm concerning for obstruction, bowel loops appear to project within the pannus suggesting a hernia. Chronic acetabular dysplasia. IMPRESSION: 1. Intact shunt tubing with tip terminating in the SVC region. Abandoned shunt fragments as discussed above. 2. Multiple loops of air distended bowel measuring up to 6.5 cm concerning for obstruction,  bowel loops appear to project within the pannus suggesting a hernia. . Electronically Signed   By: Jasmine Pang M.D.   On: 08/15/2023 15:15   DG Cervical Spine 1 View  Result Date: 08/15/2023 CLINICAL DATA:  Headache, nausea and vomiting. EXAM: DG CERVICAL SPINE - 1 VIEW COMPARISON:  05/23/2023 FINDINGS: The included portions of the current right ventricular shunt are intact without significant change. A shunt fragment in the right neck is unchanged. Thoracic spine fixation hardware is again demonstrated. Cervical spine abnormality visualized in the frontal projection. IMPRESSION: No significant change. The included portion of the current right ventricular shunt catheter remains intact. Electronically Signed   By: Beckie Salts M.D.   On: 08/15/2023 15:11      Assessment/Plan Decreased ileostomy output/Constipation  Complex surgical history  Patient seen and examined relevant labs and imaging personally reviewed. On initial review of imaging he does not appear obstructed with wide hernia defect. He does have some fecalization of small bowel contents near stoma. Recommend bowel regimen including suppositories via ileostomy, colace, and miralax. Recommend admission to medicine for IV resuscitation and bowel regimen. Encourage PO fluid intake. Discussed with patient's mother at bedside as well. No other recommendations from surgical  standpoint, surgery will not follow acutely but we are available as needed if questions or concerns.   FEN: reg diet ID: none indicated VTE: okay for chemical prophylaxis from surgical standpoint  I reviewed ED provider notes, last 24 h vitals and pain scores, last 48 h intake and output, last 24 h labs and trends, and last 24 h imaging results. Reviewed prior surgical notes from 2014.    Trixie Deis, [facility] 08/15/2023, 3:59 PM Please see Amion for pager number during day hours 7:00am-4:30pm

## 2023-08-15 NOTE — H&P (Signed)
History and Physical    Patient: [name]:[id number] DOB: [birth year] DOA: 08/15/2023 DOS: the patient was seen and examined on 08/15/2023 PCP: Sharmon Revere, MD  Patient coming from: Home  Chief Complaint: N/V, decease ileostomy output  HPI: [name] is a 42 y.o. 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   Mother at bedside provides most of the history. For the past few days he has had decreased appetite, abdominal distention and decreased ileostomy output.  Urine has also been dark.  No actual abdominal pain.  Has history of intermittent constipation and only takes stool softeners as needed.  Had headache with blurry vision.  No fever.  On arrival to ED, he was afebrile, tachycardia HR 102, BP normotensive on room air.   CBC with leukocytosis of 15K, hgb of 17, hct of 53.  Hyponatremia of 122, K of 3.1, Cl of 80, CO 27, Creatinine of 0.76.  Hypocalcemia of 8.3.   UA with large leukocytes, negative nitrate, rare bacteria.   CT head with stable VP shunt.   CT A/P w contrast with mild dilatation of the distal small bowel with fecalization proximal to the right lower quadrant ileostomy suggesting bowel stasis. No focal transitional point to suggest mechanical obstruction. Large known ventral hernia without incarceration.   General surgery was consulted and has evaluated.  Recommend hospitalist admission for IV fluid hydration and bowel regimen for constipation.  Review of Systems: As mentioned in the history of present illness. All other systems reviewed and are negative. Past Medical History:  Diagnosis Date   Arnold-Chiari malformation (HCC)    Congenital paraplegia (HCC)    MID-ABDOMIN DOWN  S/P SPINA BIFIDA   GERD (gastroesophageal reflux disease)     Heart palpitations    SECONDARY TO VP SHUNT DRAIN   History of kidney stones    History of seizure    per pt mother, no seizures since age 63   Hypertension    Ileostomy in place [facility]) 2014   Migraines followed by neurologist @[facility]    taking verapamil   Neurogenic bladder    INCONTINENT -   Neurogenic bowel    S/P VP shunt    Spina bifida with hydrocephalus, lumbar region (HCC)    CONGENITAL--   VP SHUNT in place--  PARALYZIED MID ABDOMEN DOWN   Urinary incontinence with continuous leakage    Wears glasses    Wheelchair bound    CAN TRASFER SELF WITHOUT BOARD   Past Surgical History:  Procedure Laterality Date   BACK SURGERY  infant   "get skin to stretch over his back; spinal bifida"   BACK SURGERY  age 64   scoliosis and  removed hump on his back (kyphosis) and put rods in to stabalize back"   BOTOX INJECTION N/A 11/14/2018   Procedure: BOTOX INJECTION CYSTOSCOPY;  Surgeon: Crista Elliot, MD;  Location: [facility];  Service: Urology;  Laterality: N/A;   BOTOX INJECTION N/A 05/22/2019   Procedure: BOTOX INJECTION;  Surgeon: Crista Elliot, MD;  Location: [facility];  Service: Urology;  Laterality: N/A;   BOTOX INJECTION N/A 11/13/2019   Procedure: CYSTOSCOPY BOTOX INJECTION;  Surgeon: Crista Elliot, MD;  Location: [facility];  Service: Urology;  Laterality: N/A;   BOTOX INJECTION N/A 07/01/2020   Procedure: CYSTOSCOPY, BOTOX INJECTION, BLADDER FULGERATION, SUPRAPUBIC  TUBE EXCHANGE;  Surgeon: Rene Paci, MD;  Location: [facility];  Service: Urology;  Laterality: N/A;   BOTOX INJECTION N/A 12/14/2020   Procedure: BOTOX INJECTION 300 UNITS SUPRAPUBIC TUBE CHANGE;  Surgeon: Crista Elliot, MD;  Location: [facility];  Service: Urology;  Laterality: N/A;   BOTOX INJECTION N/A 06/02/2023   Procedure: CYSTOSCOPY BOTOX INJECTION 200 UNITS;  Surgeon: Crista Elliot, MD;  Location: [facility];  Service: Urology;  Laterality: N/A;  1 HR FOR CASE   CHOLECYSTECTOMY N/A 07/06/2013    Procedure: LAPAROSCOPIC CHOLECYSTECTOMY WITH INTRAOPERATIVE CHOLANGIOGRAM;  Surgeon: Lodema Pilot, DO;  Location: [facility];  Service: General;  Laterality: N/A;   COLON RESECTION N/A 07/08/2013   Procedure: EXPLORATORY LAPAROSCOPY, DIAGNOSTIC LAPAROSCOPY, PARTIAL COLECTOMY, ABDOMINAL WASHOUT, ABDOMINAL WOUND VAC;  Surgeon: Lodema Pilot, DO;  Location: [facility];  Service: General;  Laterality: N/A;   CYSTOSCOPY WITH LITHOLAPAXY N/A 05/09/2022   Procedure: CYSTOSCOPY WITH LITHOLAPAXY, BOTOX INJECTION;  Surgeon: Crista Elliot, MD;  Location: [facility];  Service: Urology;  Laterality: N/A;   HIP SURGERY Bilateral AS CHILD   TENDON RELEASE   HOLMIUM LASER APPLICATION N/A 07/01/2020   Procedure: HOLMIUM LASER OF BLADDER STONE;  Surgeon: Rene Paci, MD;  Location: [facility];  Service: Urology;  Laterality: N/A;   INCISION AND DRAINAGE OF WOUND N/A 11/04/2013   Procedure: IRRIGATION AND DEBRIDEMENT ABDOMINAL WOUND WITH PLACEMENT OF ACELL/VAC;  Surgeon: Wayland Denis, DO;  Location: [facility];  Service: Plastics;  Laterality: N/A;   INSERTION OF SUPRAPUBIC CATHETER N/A 12/23/2019   Procedure: FLEXIBLE CYSTOSCOPE WITH FOLEY PLACEMENT OVER WIRE.;  Surgeon: Noel Christmas, MD;  Location: [facility];  Service: Urology;  Laterality: N/A;   INSERTION OF SUPRAPUBIC CATHETER N/A 07/26/2021   Procedure: EXCHANGE SUPRAPUBIC CATHETER AMD BOTOX INJECTION 300 UNITS;  Surgeon: Crista Elliot, MD;  Location: [facility];  Service: Urology;  Laterality: N/A;   INSERTION OF SUPRAPUBIC CATHETER N/A 06/02/2023   Procedure: SUPRAPUBIC CATHETER CHANGE;  Surgeon: Crista Elliot, MD;  Location: [facility];  Service: Urology;  Laterality: N/A;   IR CATHETER TUBE CHANGE  02/15/2019   IR CATHETER TUBE CHANGE  03/01/2019   IR CATHETER TUBE CHANGE  05/06/2019   IR CATHETER TUBE CHANGE  03/06/2020   IR CATHETER TUBE CHANGE  04/16/2020   IR CATHETER TUBE CHANGE  04/29/2020   ORCHIOPEXY Bilateral AS CHILD   UNDESCENDED  TESTIS   RIGHT COLECTOMY/  APPENDECTOMY/  ILEOSTOMY  NOV 2014  [facility]   surgical complication with cholecystectomy   UMBILICAL HERNIA REPAIR  child   VENTRICULOPERITONEAL SHUNT  11-12-2018 per pt and mother---LAST REVISION ~ 06/2015   DUE TO ABD WOUND--- SHUNT DRAINS TO HEART (12/23/2015)   Social History:  reports that he has never smoked. He has never been exposed to tobacco smoke. He has never used smokeless tobacco. He reports that he does not drink alcohol and does not use drugs.  Allergies  Allergen Reactions   Latex Hives   Succinylcholine Rash and Other (See Comments)    Pt is half [place] - prolonged paralysis due to compromised metabolism. Succinylcholine given on 07/07/15 at outside hospital and pt experienced paralysis > 3 hours.   Due to being native [place], his mother states that he takes longer to "come out of it  Pt is half [place] - prolonged paralysis due to compromised metabolism. Succinylcholine given on 07/07/15 at outside hospital and pt experienced paralysis >  3 hours.   Ciprofloxacin Other (See Comments)    IV only-- caused burning in arm    Vancomycin Itching and Swelling   Adhesive [Tape] Hives and Rash   Wound Dressing Adhesive Hives and Rash    Family History  Problem Relation Age of Onset   Diabetes Other    Hypertension Other    Cancer Other    Stroke Other     Prior to Admission medications   Medication Sig Start Date End Date Taking? Authorizing Provider  acetaminophen (TYLENOL) 650 MG CR tablet Take 1,300 mg by mouth every 8 (eight) hours as needed for pain.    [provider]  Ascorbic Acid (VITAMIN C) 1000 MG tablet Take 1,000 mg by mouth daily with lunch.    [provider]  calcium elemental as carbonate (BARIATRIC TUMS ULTRA) 400 MG chewable tablet Chew 1,000 mg by mouth daily.    [provider]  Cranberry-Vitamin C-Vitamin E 4200-20-3 MG-MG-UNIT CAPS Take 1 capsule by mouth in the morning, at noon,  and at bedtime.    [provider]  fluconazole (DIFLUCAN) 150 MG tablet Take 150 mg by mouth once a week. 05/01/23   [provider]  KLOR-CON M20 20 MEQ tablet Take 20 mEq by mouth See admin instructions. Take 20 mEq by mouth twice weekly on Monday and Thursday 03/14/23   [provider]  liver oil-zinc oxide (DESITIN) 40 % ointment Apply 1 Application topically as needed for irritation.    [provider]  metoprolol tartrate (LOPRESSOR) 50 MG tablet Take 50 mg by mouth 2 (two) times daily.    [provider]  Multiple Minerals-Vitamins (CALCIUM-MAGNESIUM-ZINC-D3) TABS Take 1 tablet by mouth in the morning and at bedtime.    [provider]  Multiple Vitamin (MULTIVITAMIN WITH MINERALS) TABS tablet Take 1 tablet by mouth daily in the afternoon.    [provider]  nystatin cream (MYCOSTATIN) Apply 1 Application topically 2 (two) times daily. 05/01/23   [provider]  ondansetron (ZOFRAN ODT) 4 MG disintegrating tablet Take 1 tablet (4 mg total) by mouth every 8 (eight) hours as needed for nausea or vomiting. 10/19/20   Pricilla Loveless, MD  ondansetron (ZOFRAN) 4 MG tablet Take 4 mg by mouth every 8 (eight) hours as needed for nausea or vomiting. 03/14/23   [provider]  oxybutynin (DITROPAN) 5 MG tablet Take 5 mg by mouth 2 (two) times daily. 10/03/19   [provider]  pantoprazole (PROTONIX) 40 MG tablet Take 1 tablet (40 mg total) by mouth daily for 15 days. Patient not taking: Reported on 05/18/2023 09/24/21 04/27/22  Joycelyn Das, MD  rosuvastatin (CRESTOR) 20 MG tablet Take 20 mg by mouth in the morning. 09/10/18   [provider]  verapamil (CALAN-SR) 120 MG CR tablet Take 120 mg by mouth at bedtime. 09/26/19   [provider]    Physical Exam: Vitals:   08/15/23 1915 08/15/23 2015 08/15/23 2045 08/15/23 2115  BP: 128/67 115/61 130/62 (!) 123/55  Pulse: (!) 103 100 (!) 101 99  Resp:  18 18  17   Temp:      TempSrc:      SpO2: 98% 98% 97% 97%  Weight:      Height:       Constitutional: NAD, calm, comfortable, nontoxic appearing obese male lying in bed Eyes: lids and conjunctivae normal ENMT: Mucous membranes are moist.  Neck: normal, supple Respiratory: clear to auscultation bilaterally, no wheezing, no crackles. Normal  respiratory effort. No accessory muscle use.  On room air. Cardiovascular: Regular rate and rhythm, no murmurs / rubs / gallops. No extremity edema.  Abdomen: Soft, nontender, moderately distended, large ventral hernia, ileostomy and suprapubic catheter in place  musculoskeletal: no clubbing / cyanosis.  Deformity of the lower extremity with muscle wasting. Skin: no rashes, lesions, ulcers. No induration Neurologic: CN 2-12 grossly intact.  Psychiatric: Normal judgment and insight. Alert and oriented x 3. Normal mood.   Data Reviewed:  See HPI   Assessment and Plan: * Constipation CT abdomen/pelvis with fecalization proximal to the right lower quadrant ileostomy.  He was evaluated by general surgery at bedside.  Stoma was digitized with immediate rush of air.  Surgery was able to palpate thickened stool but no concerns for mechanical obstruction. -Continue p.o. bowel regimen and suppositories -Continue IV fluid hydration  S/P VP shunt -CT head showed stable VP shunt in place  History of seizure -continue home Keppra  Hypocalcemia -replete with IV calcium  Hypokalemia -K of 3.1 -replenish with both IV and oral potassium  Hyponatremia -Na of 122. Also had hemoconcentration suggestive of dehydration.  -has received 1L NS bolus in ED -Will admit to stepdown and continue hypertonic saline with Q4HR sodium checks. Goal of correction in 24 hrs.   AKI (acute kidney injury) (HCC) -Cr of 0.76 from prior of 0.5 -continue IV fluids  Essential hypertension, benign -continue verapamil, metoprolol  Neurogenic bladder With suprapubic  catheter  Spina bifida (HCC) -paraplegic, neurogenic bladder      Advance Care Planning:   Code Status: Full Code   Consults: general surgery  Family Communication: mother at bedside  Severity of Illness: The appropriate patient status for this patient is OBSERVATION. Observation status is judged to be reasonable and necessary in order to provide the required intensity of service to ensure the patient's safety. The patient's presenting symptoms, physical exam findings, and initial radiographic and laboratory data in the context of their medical condition is felt to place them at decreased risk for further clinical deterioration. Furthermore, it is anticipated that the patient will be medically stable for discharge from the hospital within 2 midnights of admission.   Author: Anselm Jungling, DO 08/15/2023 9:50 PM  For on call review [email or web address].

## 2023-08-15 NOTE — Assessment & Plan Note (Signed)
-  Cr of 0.76 from prior of 0.5 -continue IV fluids

## 2023-08-15 NOTE — Assessment & Plan Note (Signed)
-   continue home Keppra

## 2023-08-15 NOTE — Assessment & Plan Note (Signed)
CT abdomen/pelvis with fecalization proximal to the right lower quadrant ileostomy.  He was evaluated by general surgery at bedside.  Stoma was digitized with immediate rush of air.  Surgery was able to palpate thickened stool but no concerns for mechanical obstruction. -Continue p.o. bowel regimen and suppositories -Continue IV fluid hydration

## 2023-08-15 NOTE — ED Provider Notes (Signed)
[place] EMERGENCY DEPARTMENT AT [facility] Provider Note   CSN: [id number] Arrival date & time: 08/15/23  1101     History  No chief complaint on file.   [name] is a 42 y.o. male.  The history is provided by the patient and a relative.   Patient has a ED today with a 2-day history of abdominal distention with nausea.  He has a indwelling ileostomy and suprapubic cath.  Previous medical history of spina bifida with VP shunt, neurogenic bladder.  Mom believes that the abdominal distention is from poor drainage from the ileostomy.  She has tried inserting the cleaning tube into the stoma and has gotten relief.  She is also provided senna and gas medication which has also helped with abdominal distention.  However today, she said that he has been acting more lethargic.  Mom suspicious of dehydration and states that his urine was darker than normal today and complains of a headache but has gotten progressively better as he started to drink more.  She has provided Tylenol for headache.  Denies fever, recent illness, vomiting, blood in ileostomy bag or hematuria.    Home Medications Prior to Admission medications   Medication Sig Start Date End Date Taking? Authorizing Provider  acetaminophen (TYLENOL) 650 MG CR tablet Take 1,300 mg by mouth every 8 (eight) hours as needed for pain.    [provider]  Ascorbic Acid (VITAMIN C) 1000 MG tablet Take 1,000 mg by mouth daily with lunch.    [provider]  calcium elemental as carbonate (BARIATRIC TUMS ULTRA) 400 MG chewable tablet Chew 1,000 mg by mouth daily.    [provider]  Cranberry-Vitamin C-Vitamin E 4200-20-3 MG-MG-UNIT CAPS Take 1 capsule by mouth in the morning, at noon, and at bedtime.    [provider]  fluconazole (DIFLUCAN) 150 MG tablet Take 150 mg by mouth once a week. 05/01/23   [provider]  KLOR-CON M20 20 MEQ tablet Take 20 mEq by mouth See admin  instructions. Take 20 mEq by mouth twice weekly on Monday and Thursday 03/14/23   [provider]  liver oil-zinc oxide (DESITIN) 40 % ointment Apply 1 Application topically as needed for irritation.    [provider]  metoprolol tartrate (LOPRESSOR) 50 MG tablet Take 50 mg by mouth 2 (two) times daily.    [provider]  Multiple Minerals-Vitamins (CALCIUM-MAGNESIUM-ZINC-D3) TABS Take 1 tablet by mouth in the morning and at bedtime.    [provider]  Multiple Vitamin (MULTIVITAMIN WITH MINERALS) TABS tablet Take 1 tablet by mouth daily in the afternoon.    [provider]  nystatin cream (MYCOSTATIN) Apply 1 Application topically 2 (two) times daily. 05/01/23   [provider]  ondansetron (ZOFRAN ODT) 4 MG disintegrating tablet Take 1 tablet (4 mg total) by mouth every 8 (eight) hours as needed for nausea or vomiting. 10/19/20   Pricilla Loveless, MD  ondansetron (ZOFRAN) 4 MG tablet Take 4 mg by mouth every 8 (eight) hours as needed for nausea or vomiting. 03/14/23   [provider]  oxybutynin (DITROPAN) 5 MG tablet Take 5 mg by mouth 2 (two) times daily. 10/03/19   [provider]  pantoprazole (PROTONIX) 40 MG tablet Take 1 tablet (40 mg total) by mouth daily for 15 days. Patient not taking: Reported on 05/18/2023 09/24/21 04/27/22  Joycelyn Das, MD  rosuvastatin (CRESTOR) 20 MG tablet Take 20 mg by mouth in the morning. 09/10/18  [provider]  verapamil (CALAN-SR) 120 MG CR tablet Take 120 mg by mouth at bedtime. 09/26/19   [provider]      Allergies    Latex, Succinylcholine, Ciprofloxacin, Vancomycin, and Adhesive [tape]    Review of Systems   Review of Systems  Gastrointestinal:  Positive for abdominal distention and nausea.  All other systems reviewed and are negative.   Physical Exam Updated Vital Signs BP 113/68   Pulse 85   Temp 97.7 F (36.5 C) (Oral)   Resp 18   Ht 5\' 2"  (1.575 m)    Wt 74.4 kg   SpO2 98%   BMI 30.00 kg/m  Physical Exam Vitals and nursing note reviewed.  Constitutional:      General: He is not in acute distress.    Comments: Lethargic presentation  HENT:     Head: Atraumatic.  Eyes:     General: No scleral icterus.       Right eye: No discharge.        Left eye: No discharge.     Extraocular Movements: Extraocular movements intact.     Conjunctiva/sclera: Conjunctivae normal.  Cardiovascular:     Rate and Rhythm: Tachycardia present.     Pulses: Normal pulses.  Pulmonary:     Effort: Pulmonary effort is normal.     Breath sounds: Normal breath sounds.  Abdominal:     General: There is distension.     Palpations: Abdomen is soft. There is no mass.     Tenderness: There is no abdominal tenderness. There is no guarding or rebound.     Comments: Surgical scars present across entirety of lower abdomen  Musculoskeletal:     Cervical back: No rigidity.     Right lower leg: No edema.     Left lower leg: No edema.  Skin:    General: Skin is warm and dry.     Coloration: Skin is not jaundiced or pale.     Findings: No erythema.  Neurological:     Coordination: Coordination normal.     ED Results / Procedures / Treatments   Labs (all labs ordered are listed, but only abnormal results are displayed) Labs Reviewed  URINALYSIS, ROUTINE W REFLEX MICROSCOPIC - Abnormal; Notable for the following components:      Result Value   APPearance CLOUDY (*)    Hgb urine dipstick SMALL (*)    Ketones, ur 5 (*)    Protein, ur 100 (*)    Leukocytes,Ua LARGE (*)    Bacteria, UA RARE (*)    All other components within normal limits  COMPREHENSIVE METABOLIC PANEL - Abnormal; Notable for the following components:   Sodium 122 (*)    Potassium 3.1 (*)    Chloride 80 (*)    Glucose, Bld 131 (*)    BUN 41 (*)    Calcium 8.3 (*)    ALT 59 (*)    All other components within normal limits  CBC WITH DIFFERENTIAL/PLATELET - Abnormal; Notable for the  following components:   WBC 15.1 (*)    RBC 6.68 (*)    HCT 53.2 (*)    MCV 79.6 (*)    MCH 25.4 (*)    Neutro Abs 11.9 (*)    Monocytes Absolute 1.2 (*)    All other components within normal limits  CBC WITH DIFFERENTIAL/PLATELET    EKG None  Radiology CT ABDOMEN PELVIS WO CONTRAST  Result Date: 08/15/2023 CLINICAL DATA:  Abdominal bloating with nausea  and vomiting since yesterday. Suprapubic catheter and ileostomy. Concern for bowel obstruction. EXAM: CT ABDOMEN AND PELVIS WITHOUT CONTRAST TECHNIQUE: Multidetector CT imaging of the abdomen and pelvis was performed following the standard protocol without IV contrast. RADIATION DOSE REDUCTION: This exam was performed according to the departmental dose-optimization program which includes automated exposure control, adjustment of the mA and/or kV according to patient size and/or use of iterative reconstruction technique. COMPARISON:  Abdominopelvic CT 01/12/2021 FINDINGS: Lower chest: Clear lung bases. No significant pleural or pericardial effusion. Hepatobiliary: The liver appears stable, without focal abnormality on noncontrast imaging. Stable asymmetric enlargement of the left lobe. Status post cholecystectomy without evidence of significant biliary dilatation. Pancreas: Unremarkable. No pancreatic ductal dilatation or surrounding inflammatory changes. Spleen: Normal in size without focal abnormality. Adrenals/Urinary Tract: Both adrenal glands appear normal. No evidence of urinary tract calculus, suspicious renal lesion or hydronephrosis. Suprapubic bladder catheter remains in place. The bladder is decompressed. Stomach/Bowel: No enteric contrast administered. Again demonstrated is a large broad-based chronic ventral hernia containing portions of the distal stomach and multiple loops of small bowel. The stomach appears unremarkable for its degree of distention. There is mild dilatation of the distal small bowel with fecalization proximal to the  right lower quadrant ileostomy. Hartman pouch consisting of nearly the entire colon is unchanged, decompressed. The right colon has been previously resected. Vascular/Lymphatic: There are no enlarged abdominal or pelvic lymph nodes. No significant vascular findings on noncontrast imaging. Reproductive: The prostate gland and seminal vesicles are unremarkable. Other: As above, large chronic ventral hernia containing portions of the stomach and small bowel. No evidence of incarceration. No ascites or pneumoperitoneum. Musculoskeletal: Stable postsurgical changes from thoracolumbar spinal fixation with underlying spinal dysraphism. No acute osseous findings are evident. Severe diffuse muscular atrophy. Unchanged ventricular peritoneal shunt tubing terminating in the subcutaneous tissues of the anterior right upper quadrant. IMPRESSION: 1. Mild dilatation of the distal small bowel with fecalization proximal to the right lower quadrant ileostomy suggesting bowel stasis. No focal transition point to suggest mechanical obstruction. 2. Large chronic ventral hernia containing portions of the distal stomach and multiple loops of small bowel. No evidence of incarceration. 3. Stable postsurgical changes from right hemicolectomy, ileostomy and long segment Hartman pouch. 4. Suprapubic bladder catheter remains in place. The bladder is decompressed. 5. Stable postsurgical changes from thoracolumbar spinal fixation with underlying spinal dysraphism and severe diffuse muscular atrophy. Electronically Signed   By: Carey Bullocks M.D.   On: 08/15/2023 15:43   CT Head Wo Contrast  Result Date: 08/15/2023 CLINICAL DATA:  Headache vomiting EXAM: CT HEAD WITHOUT CONTRAST TECHNIQUE: Contiguous axial images were obtained from the base of the skull through the vertex without intravenous contrast. RADIATION DOSE REDUCTION: This exam was performed according to the departmental dose-optimization program which includes automated exposure  control, adjustment of the mA and/or kV according to patient size and/or use of iterative reconstruction technique. COMPARISON:  Head CT 05/23/2023 FINDINGS: Brain: No acute territorial infarction, hemorrhage, or intracranial mass. Right posterior shunt catheter with tip at the left frontal horn, stable in position. No interval ventricular a large mint. Effaced fourth ventricle as before. 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 Vascular: No hyperdense vessels.  No unexpected calcification. Skull: No fracture.  Hyperostosis Sinuses/Orbits: No acute finding. Other: None IMPRESSION: 1. No CT evidence for acute intracranial abnormality. 2. Right posterior shunt catheter with tip at or near the left frontal horn, stable in position. Stable ventricle size and configuration  with no interval enlargement compared to the comparison study. 3. 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 Electronically Signed   By: Jasmine Pang M.D.   On: 08/15/2023 15:35   DG Skull 1-3 Views  Result Date: 08/15/2023 CLINICAL DATA:  Possible shunt malfunction headache nausea vomiting EXAM: SKULL - 1-3 VIEW; CHEST  1 VIEW; ABDOMEN - 1 VIEW COMPARISON:  05/23/2023 FINDINGS: Frontal and lateral views of the skull demonstrate similar positioning of right-sided intracranial shunt with radiolucent portion over the right occipital area. Partially visualized abandoned shunt tubing in the right neck. Single-view chest demonstrates intact shunt tubing more medially along the right chest, with the tip positioned over the SVC region. Abandoned shunt fragments at the right neck and slightly more lateral right chest. Hypoventilatory changes. Posterior spinal hardware. Supine views of the abdomen demonstrate posterior spinal hardware with chronic fracture through left-sided hardware. Multiple loops of air distended bowel measuring up to 6.5 cm concerning for obstruction, bowel loops  appear to project within the pannus suggesting a hernia. Chronic acetabular dysplasia. IMPRESSION: 1. Intact shunt tubing with tip terminating in the SVC region. Abandoned shunt fragments as discussed above. 2. Multiple loops of air distended bowel measuring up to 6.5 cm concerning for obstruction, bowel loops appear to project within the pannus suggesting a hernia. . Electronically Signed   By: Jasmine Pang M.D.   On: 08/15/2023 15:15   DG Chest 1 View  Result Date: 08/15/2023 CLINICAL DATA:  Possible shunt malfunction headache nausea vomiting EXAM: SKULL - 1-3 VIEW; CHEST  1 VIEW; ABDOMEN - 1 VIEW COMPARISON:  05/23/2023 FINDINGS: Frontal and lateral views of the skull demonstrate similar positioning of right-sided intracranial shunt with radiolucent portion over the right occipital area. Partially visualized abandoned shunt tubing in the right neck. Single-view chest demonstrates intact shunt tubing more medially along the right chest, with the tip positioned over the SVC region. Abandoned shunt fragments at the right neck and slightly more lateral right chest. Hypoventilatory changes. Posterior spinal hardware. Supine views of the abdomen demonstrate posterior spinal hardware with chronic fracture through left-sided hardware. Multiple loops of air distended bowel measuring up to 6.5 cm concerning for obstruction, bowel loops appear to project within the pannus suggesting a hernia. Chronic acetabular dysplasia. IMPRESSION: 1. Intact shunt tubing with tip terminating in the SVC region. Abandoned shunt fragments as discussed above. 2. Multiple loops of air distended bowel measuring up to 6.5 cm concerning for obstruction, bowel loops appear to project within the pannus suggesting a hernia. . Electronically Signed   By: Jasmine Pang M.D.   On: 08/15/2023 15:15   DG Abd 1 View  Result Date: 08/15/2023 CLINICAL DATA:  Possible shunt malfunction headache nausea vomiting EXAM: SKULL - 1-3 VIEW; CHEST  1 VIEW;  ABDOMEN - 1 VIEW COMPARISON:  05/23/2023 FINDINGS: Frontal and lateral views of the skull demonstrate similar positioning of right-sided intracranial shunt with radiolucent portion over the right occipital area. Partially visualized abandoned shunt tubing in the right neck. Single-view chest demonstrates intact shunt tubing more medially along the right chest, with the tip positioned over the SVC region. Abandoned shunt fragments at the right neck and slightly more lateral right chest. Hypoventilatory changes. Posterior spinal hardware. Supine views of the abdomen demonstrate posterior spinal hardware with chronic fracture through left-sided hardware. Multiple loops of air distended bowel measuring up to 6.5 cm concerning for obstruction, bowel loops appear to project within the pannus suggesting a hernia. Chronic acetabular dysplasia. IMPRESSION: 1. Intact shunt  tubing with tip terminating in the SVC region. Abandoned shunt fragments as discussed above. 2. Multiple loops of air distended bowel measuring up to 6.5 cm concerning for obstruction, bowel loops appear to project within the pannus suggesting a hernia. . Electronically Signed   By: Jasmine Pang M.D.   On: 08/15/2023 15:15   DG Cervical Spine 1 View  Result Date: 08/15/2023 CLINICAL DATA:  Headache, nausea and vomiting. EXAM: DG CERVICAL SPINE - 1 VIEW COMPARISON:  05/23/2023 FINDINGS: The included portions of the current right ventricular shunt are intact without significant change. A shunt fragment in the right neck is unchanged. Thoracic spine fixation hardware is again demonstrated. Cervical spine abnormality visualized in the frontal projection. IMPRESSION: No significant change. The included portion of the current right ventricular shunt catheter remains intact. Electronically Signed   By: Beckie Salts M.D.   On: 08/15/2023 15:11    Procedures Procedures    Medications Ordered in ED Medications  docusate sodium (COLACE) capsule 100 mg  (has no administration in time range)  polyethylene glycol (MIRALAX / GLYCOLAX) packet 17 g (has no administration in time range)  bisacodyl (DULCOLAX) suppository 10 mg (has no administration in time range)  sodium chloride 0.9 % bolus 1,000 mL (0 mLs Intravenous Stopped 08/15/23 1648)    ED Course/ Medical Decision Making/ A&P Clinical Course as of 08/15/23 1813  Tue Aug 15, 2023  1811 Comprehensive metabolic panel(!) Sodium 122 lower than previous likely due to poor intake, potassium 3.1 within normal for this patient [CB]  1813 CBC with Differential/Platelet(!) White count elevated most likely due to inflammation from [CB]    Clinical Course User Index [CB] Lunette Stands, PA-C                                 Medical Decision Making    Patient presents to the ED for concern of ileostomy bag not draining properly and abdominal distention with appetite decreased., this involves an extensive number of treatment options, and is a complaint that carries with it a high risk of complications and morbidity.  The differential diagnosis includes incarcerated hernia, dehydration, bowel obstruction, underlying infection   Co morbidities that complicate the patient evaluation  Ileostomy post complicated cholecystectomy with bowel necrosis Urostomy for neurogenic bladder Spina bifida with VP shunt   Additional history obtained:  Additional history obtained from  Family, Nursing, Outside Medical Records, and Past Admission    Imaging Studies ordered:  I ordered imaging studies including CT of abdomen, CT head, shunt series I independently visualized and interpreted imaging which showed shunt was intact, and hernia without incarceration and stool without presence of impaction. I agree with the radiologist interpretation    Medicines ordered and prescription drug management:  I ordered medication including normal saline for dehydration Reevaluation of the patient after these  medicines showed that the patient improved.  Patient became more alert and talkative after fluids were given. I have reviewed the patients home medicines and have made adjustments as needed   Consultations Obtained:  I requested consultation with the general surgery,  and discussed lab and imaging findings as well as pertinent plan - they recommend: At this patient did not need surgery and needed to be admitted to the floor for IV fluids and bowel regimen.   Problem List / ED Course:  Ileostomy dysfunction --patient is a 42 year old male who came to the ED today after 2-day history  of poor ileostomy drainage and dark urine and lethargy.  Upon evaluation the patient he was able to answer questions but seemed lethargic constantly closing his eyes during conversation but did not appear in obvious distress.  Physical exam showed a protuberant abdomen that was mildly distended.  It was nontender.  Dialysis today seems dehydrated due to documenting in the.  Eating and drinking normally for the last 2 days.  For this reason 1 L of normal saline was given.  On labs hyponatremia was noted, UTI, the patient with the ostomy likely baseline, elevated white count also noted.  Upon consulting with GI they determined that he was not needing surgery and instead would be to receive fluids and GI cocktail with admission.  Patient was then admitted and transferred to the hospitalist.     Reevaluation:  After the interventions noted above, I reevaluated the patient and found that they have :improved   Social Determinants of Health:  At home being taken care of by mom   Dispostion:  After consideration of the diagnostic results and the patients response to treatment, I feel that the patent would benefit from admission.   Final Clinical Impression(s) / ED Diagnoses Final diagnoses:  Dehydration  Ileostomy dysfunction (HCC)  Hyponatremia    Rx / DC Orders ED Discharge Orders     None          Lavonia Drafts 08/15/23 1910    Linwood Dibbles, MD 08/16/23 1100

## 2023-08-15 NOTE — Assessment & Plan Note (Signed)
-  replete with IV calcium

## 2023-08-15 NOTE — Assessment & Plan Note (Signed)
 With suprapubic catheter

## 2023-08-15 NOTE — ED Notes (Signed)
Second attempt- only able to collect cmp

## 2023-08-15 NOTE — ED Notes (Signed)
Patient transported to CT 

## 2023-08-15 NOTE — Assessment & Plan Note (Signed)
-  Na of 122. Also had hemoconcentration suggestive of dehydration.  -has received 1L NS bolus in ED -Will admit to stepdown and continue hypertonic saline with Q4HR sodium checks. Goal of correction in 24 hrs.

## 2023-08-15 NOTE — Assessment & Plan Note (Addendum)
-  continue verapamil, metoprolol

## 2023-08-15 NOTE — Assessment & Plan Note (Signed)
-  CT head showed stable VP shunt in place

## 2023-08-15 NOTE — Assessment & Plan Note (Signed)
-  K of 3.1 -replenish with both IV and oral potassium

## 2023-08-15 NOTE — ED Notes (Signed)
ED TO INPATIENT HANDOFF REPORT  Name/Age/Gender [name] 42 y.o. male  Code Status    Code Status Orders  (From admission, onward)           Start     Ordered   08/15/23 2129  Full code  Continuous       Question:  By:  Answer:  Consent: discussion documented in EHR   08/15/23 2129           Code Status History     Date Active Date Inactive Code Status Order ID Comments User Context   09/22/2021 1648 09/24/2021 1947 Full Code [id number]  Azucena Fallen, MD ED   11/20/2015 0339 11/23/2015 1709 Full Code [id number]  Bobette Mo, MD Inpatient   07/22/2015 2332 07/25/2015 1537 Full Code [id number]  Ron Parker, MD Inpatient   11/06/2014 2006 11/07/2014 2253 Full Code [id number]  Yevonne Pax, MD Inpatient   10/26/2014 0143 10/30/2014 2023 Full Code [id number]  Storm Frisk, MD Inpatient   07/06/2013 1314 07/09/2013 1507 Full Code [id number]  Lodema Pilot, DO Inpatient   07/05/2013 1637 07/06/2013 1314 Full Code [id number]  Barnetta Chapel, PA-C ED       Home/SNF/Other Home  Chief Complaint Hyponatremia [E87.1]  Level of Care/Admitting Diagnosis ED Disposition     ED Disposition  Admit   Condition  --   Comment  Hospital Area: [facility] [[id number]]  Level of Care: Stepdown [14]  Admit to SDU based on following criteria: Severe physiological/psychological symptoms:  Any diagnosis requiring assessment & intervention at least every 4 hours on an ongoing basis to obtain desired patient outcomes including stability and rehabilitation  May place patient in observation at [facility] or [facility] if equivalent level of care is available:: No  Covid Evaluation: Asymptomatic - no recent exposure (last 10 days) testing not required  Diagnosis: Hyponatremia [198519]  Admitting Physician: Anselm Jungling [[id number]]  Attending Physician: Anselm Jungling [[id number]]          Medical History Past Medical History:  Diagnosis Date    Arnold-Chiari malformation (HCC)    Congenital paraplegia (HCC)    MID-ABDOMIN DOWN  S/P SPINA BIFIDA   GERD (gastroesophageal reflux disease)    Heart palpitations    SECONDARY TO VP SHUNT DRAIN   History of kidney stones    History of seizure    per pt mother, no seizures since age 80   Hypertension    Ileostomy in place [facility]) 2014   Migraines followed by neurologist @[facility]    taking verapamil   Neurogenic bladder    INCONTINENT -   Neurogenic bowel    S/P VP shunt    Spina bifida with hydrocephalus, lumbar region (HCC)    CONGENITAL--   VP SHUNT in place--  PARALYZIED MID ABDOMEN DOWN   Urinary incontinence with continuous leakage    Wears glasses    Wheelchair bound    CAN TRASFER SELF WITHOUT BOARD    Allergies Allergies  Allergen Reactions   Latex Hives   Succinylcholine Rash and Other (See Comments)    Pt is half [place] - prolonged paralysis due to compromised metabolism. Succinylcholine given on 07/07/15 at outside hospital and pt experienced paralysis > 3 hours.   Due to being native [place], his mother states that he takes longer to "come out of it  Pt is half [place] - prolonged paralysis due to compromised metabolism. Succinylcholine given on 07/07/15 at outside hospital  and pt experienced paralysis > 3 hours.   Ciprofloxacin Other (See Comments)    IV only-- caused burning in arm    Vancomycin Itching and Swelling   Adhesive [Tape] Hives and Rash   Wound Dressing Adhesive Hives and Rash    IV Location/Drains/Wounds Patient Lines/Drains/Airways Status     Active Line/Drains/Airways     Name Placement date Placement time Site Days   Peripheral IV 08/15/23 22 G 1.75" Right;Anterior Forearm 08/15/23  1538  Forearm  less than 1   Ileostomy Continent (Abdominal pouch) RLQ 09/23/21  1523  RLQ  691   Suprapubic Catheter Latex 22 Fr. 06/02/23  1328  Latex  74            Labs/Imaging Results for orders placed or performed during the hospital  encounter of 08/15/23 (from the past 48 hour(s))  Comprehensive metabolic panel     Status: Abnormal   Collection Time: 08/15/23 12:47 PM  Result Value Ref Range   Sodium 122 (L) 135 - 145 mmol/L   Potassium 3.1 (L) 3.5 - 5.1 mmol/L   Chloride 80 (L) 98 - 111 mmol/L   CO2 27 22 - 32 mmol/L   Glucose, Bld 131 (H) 70 - 99 mg/dL    Comment: Glucose reference range applies only to samples taken after fasting for at least 8 hours.   BUN 41 (H) 6 - 20 mg/dL   Creatinine, Ser 2.95 0.61 - 1.24 mg/dL   Calcium 8.3 (L) 8.9 - 10.3 mg/dL   Total Protein 7.7 6.5 - 8.1 g/dL   Albumin 3.8 3.5 - 5.0 g/dL   AST 27 15 - 41 U/L   ALT 59 (H) 0 - 44 U/L   Alkaline Phosphatase 74 38 - 126 U/L   Total Bilirubin 1.1 <1.2 mg/dL   GFR, Estimated >62 >13 mL/min    Comment: (NOTE) Calculated using the CKD-EPI Creatinine Equation (2021)    Anion gap 15 5 - 15    Comment: Performed at [facility], [address] [place], [place] [address]  Urinalysis, Routine w reflex microscopic -Urine, Catheterized     Status: Abnormal   Collection Time: 08/15/23  1:10 PM  Result Value Ref Range   Color, Urine YELLOW YELLOW   APPearance CLOUDY (A) CLEAR   Specific Gravity, Urine 1.015 1.005 - 1.030   pH 7.0 5.0 - 8.0   Glucose, UA NEGATIVE NEGATIVE mg/dL   Hgb urine dipstick SMALL (A) NEGATIVE   Bilirubin Urine NEGATIVE NEGATIVE   Ketones, ur 5 (A) NEGATIVE mg/dL   Protein, ur 846 (A) NEGATIVE mg/dL   Nitrite NEGATIVE NEGATIVE   Leukocytes,Ua LARGE (A) NEGATIVE   RBC / HPF 11-20 0 - 5 RBC/hpf   WBC, UA >50 0 - 5 WBC/hpf   Bacteria, UA RARE (A) NONE SEEN   Squamous Epithelial / HPF 6-10 0 - 5 /HPF   WBC Clumps PRESENT    Mucus PRESENT    Hyaline Casts, UA PRESENT    Triple Phosphate Crystal PRESENT     Comment: Performed at [facility], [address] [place], [place] [address]  CBC with Differential/Platelet     Status: Abnormal   Collection Time: 08/15/23  3:30 PM   Result Value Ref Range   WBC 15.1 (H) 4.0 - 10.5 K/uL   RBC 6.68 (H) 4.22 - 5.81 MIL/uL   Hemoglobin 17.0 13.0 - 17.0 g/dL   HCT 28.4 (H) 13.2 - 44.0 %   MCV 79.6 (  L) 80.0 - 100.0 fL   MCH 25.4 (L) 26.0 - 34.0 pg   MCHC 32.0 30.0 - 36.0 g/dL   RDW 64.4 03.4 - 74.2 %   Platelets 300 150 - 400 K/uL   nRBC 0.0 0.0 - 0.2 %   Neutrophils Relative % 79 %   Neutro Abs 11.9 (H) 1.7 - 7.7 K/uL   Lymphocytes Relative 12 %   Lymphs Abs 1.9 0.7 - 4.0 K/uL   Monocytes Relative 8 %   Monocytes Absolute 1.2 (H) 0.1 - 1.0 K/uL   Eosinophils Relative 0 %   Eosinophils Absolute 0.0 0.0 - 0.5 K/uL   Basophils Relative 0 %   Basophils Absolute 0.0 0.0 - 0.1 K/uL   Immature Granulocytes 1 %   Abs Immature Granulocytes 0.07 0.00 - 0.07 K/uL    Comment: Performed at [facility], [address], [place], [place] [address]   CT ABDOMEN PELVIS WO CONTRAST  Result Date: 08/15/2023 CLINICAL DATA:  Abdominal bloating with nausea and vomiting since yesterday. Suprapubic catheter and ileostomy. Concern for bowel obstruction. EXAM: CT ABDOMEN AND PELVIS WITHOUT CONTRAST TECHNIQUE: Multidetector CT imaging of the abdomen and pelvis was performed following the standard protocol without IV contrast. RADIATION DOSE REDUCTION: This exam was performed according to the departmental dose-optimization program which includes automated exposure control, adjustment of the mA and/or kV according to patient size and/or use of iterative reconstruction technique. COMPARISON:  Abdominopelvic CT 01/12/2021 FINDINGS: Lower chest: Clear lung bases. No significant pleural or pericardial effusion. Hepatobiliary: The liver appears stable, without focal abnormality on noncontrast imaging. Stable asymmetric enlargement of the left lobe. Status post cholecystectomy without evidence of significant biliary dilatation. Pancreas: Unremarkable. No pancreatic ductal dilatation or surrounding inflammatory changes. Spleen: Normal in  size without focal abnormality. Adrenals/Urinary Tract: Both adrenal glands appear normal. No evidence of urinary tract calculus, suspicious renal lesion or hydronephrosis. Suprapubic bladder catheter remains in place. The bladder is decompressed. Stomach/Bowel: No enteric contrast administered. Again demonstrated is a large broad-based chronic ventral hernia containing portions of the distal stomach and multiple loops of small bowel. The stomach appears unremarkable for its degree of distention. There is mild dilatation of the distal small bowel with fecalization proximal to the right lower quadrant ileostomy. Hartman pouch consisting of nearly the entire colon is unchanged, decompressed. The right colon has been previously resected. Vascular/Lymphatic: There are no enlarged abdominal or pelvic lymph nodes. No significant vascular findings on noncontrast imaging. Reproductive: The prostate gland and seminal vesicles are unremarkable. Other: As above, large chronic ventral hernia containing portions of the stomach and small bowel. No evidence of incarceration. No ascites or pneumoperitoneum. Musculoskeletal: Stable postsurgical changes from thoracolumbar spinal fixation with underlying spinal dysraphism. No acute osseous findings are evident. Severe diffuse muscular atrophy. Unchanged ventricular peritoneal shunt tubing terminating in the subcutaneous tissues of the anterior right upper quadrant. IMPRESSION: 1. Mild dilatation of the distal small bowel with fecalization proximal to the right lower quadrant ileostomy suggesting bowel stasis. No focal transition point to suggest mechanical obstruction. 2. Large chronic ventral hernia containing portions of the distal stomach and multiple loops of small bowel. No evidence of incarceration. 3. Stable postsurgical changes from right hemicolectomy, ileostomy and long segment Hartman pouch. 4. Suprapubic bladder catheter remains in place. The bladder is decompressed. 5.  Stable postsurgical changes from thoracolumbar spinal fixation with underlying spinal dysraphism and severe diffuse muscular atrophy. Electronically Signed   By: Carey Bullocks M.D.   On: 08/15/2023 15:43  CT Head Wo Contrast  Result Date: 08/15/2023 CLINICAL DATA:  Headache vomiting EXAM: CT HEAD WITHOUT CONTRAST TECHNIQUE: Contiguous axial images were obtained from the base of the skull through the vertex without intravenous contrast. RADIATION DOSE REDUCTION: This exam was performed according to the departmental dose-optimization program which includes automated exposure control, adjustment of the mA and/or kV according to patient size and/or use of iterative reconstruction technique. COMPARISON:  Head CT 05/23/2023 FINDINGS: Brain: No acute territorial infarction, hemorrhage, or intracranial mass. Right posterior shunt catheter with tip at the left frontal horn, stable in position. No interval ventricular a large mint. Effaced fourth ventricle as before. 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 Vascular: No hyperdense vessels.  No unexpected calcification. Skull: No fracture.  Hyperostosis Sinuses/Orbits: No acute finding. Other: None IMPRESSION: 1. No CT evidence for acute intracranial abnormality. 2. Right posterior shunt catheter with tip at or near the left frontal horn, stable in position. Stable ventricle size and configuration with no interval enlargement compared to the comparison study. 3. 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 Electronically Signed   By: Jasmine Pang M.D.   On: 08/15/2023 15:35   DG Skull 1-3 Views  Result Date: 08/15/2023 CLINICAL DATA:  Possible shunt malfunction headache nausea vomiting EXAM: SKULL - 1-3 VIEW; CHEST  1 VIEW; ABDOMEN - 1 VIEW COMPARISON:  05/23/2023 FINDINGS: Frontal and lateral views of the skull demonstrate similar positioning of right-sided intracranial shunt with radiolucent  portion over the right occipital area. Partially visualized abandoned shunt tubing in the right neck. Single-view chest demonstrates intact shunt tubing more medially along the right chest, with the tip positioned over the SVC region. Abandoned shunt fragments at the right neck and slightly more lateral right chest. Hypoventilatory changes. Posterior spinal hardware. Supine views of the abdomen demonstrate posterior spinal hardware with chronic fracture through left-sided hardware. Multiple loops of air distended bowel measuring up to 6.5 cm concerning for obstruction, bowel loops appear to project within the pannus suggesting a hernia. Chronic acetabular dysplasia. IMPRESSION: 1. Intact shunt tubing with tip terminating in the SVC region. Abandoned shunt fragments as discussed above. 2. Multiple loops of air distended bowel measuring up to 6.5 cm concerning for obstruction, bowel loops appear to project within the pannus suggesting a hernia. . Electronically Signed   By: Jasmine Pang M.D.   On: 08/15/2023 15:15   DG Chest 1 View  Result Date: 08/15/2023 CLINICAL DATA:  Possible shunt malfunction headache nausea vomiting EXAM: SKULL - 1-3 VIEW; CHEST  1 VIEW; ABDOMEN - 1 VIEW COMPARISON:  05/23/2023 FINDINGS: Frontal and lateral views of the skull demonstrate similar positioning of right-sided intracranial shunt with radiolucent portion over the right occipital area. Partially visualized abandoned shunt tubing in the right neck. Single-view chest demonstrates intact shunt tubing more medially along the right chest, with the tip positioned over the SVC region. Abandoned shunt fragments at the right neck and slightly more lateral right chest. Hypoventilatory changes. Posterior spinal hardware. Supine views of the abdomen demonstrate posterior spinal hardware with chronic fracture through left-sided hardware. Multiple loops of air distended bowel measuring up to 6.5 cm concerning for obstruction, bowel loops  appear to project within the pannus suggesting a hernia. Chronic acetabular dysplasia. IMPRESSION: 1. Intact shunt tubing with tip terminating in the SVC region. Abandoned shunt fragments as discussed above. 2. Multiple loops of air distended bowel measuring up to 6.5 cm concerning for obstruction, bowel loops appear to project  within the pannus suggesting a hernia. . Electronically Signed   By: Jasmine Pang M.D.   On: 08/15/2023 15:15   DG Abd 1 View  Result Date: 08/15/2023 CLINICAL DATA:  Possible shunt malfunction headache nausea vomiting EXAM: SKULL - 1-3 VIEW; CHEST  1 VIEW; ABDOMEN - 1 VIEW COMPARISON:  05/23/2023 FINDINGS: Frontal and lateral views of the skull demonstrate similar positioning of right-sided intracranial shunt with radiolucent portion over the right occipital area. Partially visualized abandoned shunt tubing in the right neck. Single-view chest demonstrates intact shunt tubing more medially along the right chest, with the tip positioned over the SVC region. Abandoned shunt fragments at the right neck and slightly more lateral right chest. Hypoventilatory changes. Posterior spinal hardware. Supine views of the abdomen demonstrate posterior spinal hardware with chronic fracture through left-sided hardware. Multiple loops of air distended bowel measuring up to 6.5 cm concerning for obstruction, bowel loops appear to project within the pannus suggesting a hernia. Chronic acetabular dysplasia. IMPRESSION: 1. Intact shunt tubing with tip terminating in the SVC region. Abandoned shunt fragments as discussed above. 2. Multiple loops of air distended bowel measuring up to 6.5 cm concerning for obstruction, bowel loops appear to project within the pannus suggesting a hernia. . Electronically Signed   By: Jasmine Pang M.D.   On: 08/15/2023 15:15   DG Cervical Spine 1 View  Result Date: 08/15/2023 CLINICAL DATA:  Headache, nausea and vomiting. EXAM: DG CERVICAL SPINE - 1 VIEW COMPARISON:   05/23/2023 FINDINGS: The included portions of the current right ventricular shunt are intact without significant change. A shunt fragment in the right neck is unchanged. Thoracic spine fixation hardware is again demonstrated. Cervical spine abnormality visualized in the frontal projection. IMPRESSION: No significant change. The included portion of the current right ventricular shunt catheter remains intact. Electronically Signed   By: Beckie Salts M.D.   On: 08/15/2023 15:11    Pending Labs Unresulted Labs (From admission, onward)     Start     Ordered   08/16/23 0500  CBC  Tomorrow morning,   R       Question:  Specimen collection method  Answer:  IV Team=IV Team collect   08/15/23 2129   08/16/23 0500  Basic metabolic panel  Tomorrow morning,   R       Question:  Specimen collection method  Answer:  IV Team=IV Team collect   08/15/23 2129   08/16/23 0330  Sodium  Every 4 hours,   R (with TIMED occurrences)     Question:  Specimen collection method  Answer:  IV Team=IV Team collect   08/15/23 2132   08/15/23 1912  Magnesium  Once,   STAT       Question:  Specimen collection method  Answer:  IV Team=IV Team collect   08/15/23 1911   08/15/23 1910  Basic metabolic panel  ONCE - STAT,   STAT       Question:  Specimen collection method  Answer:  IV Team=IV Team collect   08/15/23 1909            Vitals/Pain Today's Vitals   08/15/23 1915 08/15/23 2015 08/15/23 2045 08/15/23 2115  BP: 128/67 115/61 130/62 (!) 123/55  Pulse: (!) 103 100 (!) 101 99  Resp: 18 18  17   Temp:      TempSrc:      SpO2: 98% 98% 97% 97%  Weight:      Height:      PainSc:  Isolation Precautions No active isolations  Medications Medications  docusate sodium (COLACE) capsule 100 mg (has no administration in time range)  polyethylene glycol (MIRALAX / GLYCOLAX) packet 17 g (has no administration in time range)  bisacodyl (DULCOLAX) suppository 10 mg (has no administration in time range)   oxybutynin (DITROPAN) tablet 5 mg (has no administration in time range)  levETIRAcetam (KEPPRA) tablet 500 mg (has no administration in time range)  metoprolol tartrate (LOPRESSOR) tablet 50 mg (50 mg Oral Given 08/15/23 2107)  potassium chloride SA (KLOR-CON M) CR tablet 20 mEq (has no administration in time range)  verapamil (CALAN-SR) CR tablet 120 mg (has no administration in time range)  rosuvastatin (CRESTOR) tablet 20 mg (20 mg Oral Given 08/15/23 2107)  calcium carbonate (TUMS - dosed in mg elemental calcium) chewable tablet 500 mg (has no administration in time range)  enoxaparin (LOVENOX) injection 40 mg (has no administration in time range)  sodium chloride (hypertonic) 3 % solution (has no administration in time range)  sodium chloride 0.9 % bolus 1,000 mL (0 mLs Intravenous Stopped 08/15/23 1648)  potassium chloride SA (KLOR-CON M) CR tablet 40 mEq (40 mEq Oral Given 08/15/23 1959)  potassium chloride 10 mEq in 100 mL IVPB (0 mEq Intravenous Stopped 08/15/23 2208)  calcium gluconate 1 g/ 50 mL sodium chloride IVPB (0 mg Intravenous Stopped 08/15/23 2148)    Mobility non-ambulatory

## 2023-08-15 NOTE — Assessment & Plan Note (Signed)
-  paraplegic, neurogenic bladder

## 2023-08-15 NOTE — ED Triage Notes (Signed)
The pt was bib PTAR. The pt reports bloating, and n/v yesterday. He reports taking Zofran 4mg  x 1 around lunchtime yesterday. He has a suprapubic cath and an ileostomy. CC decreased output in urostomy. The only way to get output was pt's mom had to place a stick or a finger in the stoma. It helped some but now bloating has continued. The pt reports urine color changed to a dark color. The pt denies any irritation to stoma. VS B/P 162/90, O2 Sats 95%RA, P 104, R 22, T 96.64F

## 2023-08-16 ENCOUNTER — Other Ambulatory Visit: Payer: Self-pay

## 2023-08-16 DIAGNOSIS — Z8249 Family history of ischemic heart disease and other diseases of the circulatory system: Secondary | ICD-10-CM | POA: Diagnosis not present

## 2023-08-16 DIAGNOSIS — I10 Essential (primary) hypertension: Secondary | ICD-10-CM | POA: Diagnosis present

## 2023-08-16 DIAGNOSIS — N319 Neuromuscular dysfunction of bladder, unspecified: Secondary | ICD-10-CM | POA: Diagnosis present

## 2023-08-16 DIAGNOSIS — N179 Acute kidney failure, unspecified: Secondary | ICD-10-CM | POA: Diagnosis present

## 2023-08-16 DIAGNOSIS — K219 Gastro-esophageal reflux disease without esophagitis: Secondary | ICD-10-CM | POA: Diagnosis present

## 2023-08-16 DIAGNOSIS — Z993 Dependence on wheelchair: Secondary | ICD-10-CM | POA: Diagnosis not present

## 2023-08-16 DIAGNOSIS — E860 Dehydration: Secondary | ICD-10-CM | POA: Diagnosis present

## 2023-08-16 DIAGNOSIS — G808 Other cerebral palsy: Secondary | ICD-10-CM | POA: Diagnosis present

## 2023-08-16 DIAGNOSIS — E876 Hypokalemia: Secondary | ICD-10-CM | POA: Diagnosis present

## 2023-08-16 DIAGNOSIS — Z932 Ileostomy status: Secondary | ICD-10-CM | POA: Diagnosis not present

## 2023-08-16 DIAGNOSIS — Z91048 Other nonmedicinal substance allergy status: Secondary | ICD-10-CM | POA: Diagnosis not present

## 2023-08-16 DIAGNOSIS — Z982 Presence of cerebrospinal fluid drainage device: Secondary | ICD-10-CM | POA: Diagnosis not present

## 2023-08-16 DIAGNOSIS — Q0703 Arnold-Chiari syndrome with spina bifida and hydrocephalus: Secondary | ICD-10-CM | POA: Diagnosis not present

## 2023-08-16 DIAGNOSIS — R0902 Hypoxemia: Secondary | ICD-10-CM | POA: Diagnosis present

## 2023-08-16 DIAGNOSIS — E871 Hypo-osmolality and hyponatremia: Secondary | ICD-10-CM | POA: Diagnosis present

## 2023-08-16 DIAGNOSIS — E669 Obesity, unspecified: Secondary | ICD-10-CM | POA: Diagnosis present

## 2023-08-16 DIAGNOSIS — Z888 Allergy status to other drugs, medicaments and biological substances status: Secondary | ICD-10-CM | POA: Diagnosis not present

## 2023-08-16 DIAGNOSIS — Z881 Allergy status to other antibiotic agents status: Secondary | ICD-10-CM | POA: Diagnosis not present

## 2023-08-16 DIAGNOSIS — G40909 Epilepsy, unspecified, not intractable, without status epilepticus: Secondary | ICD-10-CM | POA: Diagnosis present

## 2023-08-16 DIAGNOSIS — G43909 Migraine, unspecified, not intractable, without status migrainosus: Secondary | ICD-10-CM | POA: Diagnosis present

## 2023-08-16 DIAGNOSIS — Z91040 Latex allergy status: Secondary | ICD-10-CM | POA: Diagnosis not present

## 2023-08-16 DIAGNOSIS — K5900 Constipation, unspecified: Secondary | ICD-10-CM | POA: Diagnosis present

## 2023-08-16 DIAGNOSIS — K439 Ventral hernia without obstruction or gangrene: Secondary | ICD-10-CM | POA: Diagnosis present

## 2023-08-16 LAB — CBC
HCT: 47.2 % (ref 39.0–52.0)
Hemoglobin: 14.8 g/dL (ref 13.0–17.0)
MCH: 25.6 pg — ABNORMAL LOW (ref 26.0–34.0)
MCHC: 31.4 g/dL (ref 30.0–36.0)
MCV: 81.8 fL (ref 80.0–100.0)
Platelets: 248 10*3/uL (ref 150–400)
RBC: 5.77 MIL/uL (ref 4.22–5.81)
RDW: 14.4 % (ref 11.5–15.5)
WBC: 8.5 10*3/uL (ref 4.0–10.5)
nRBC: 0 % (ref 0.0–0.2)

## 2023-08-16 LAB — BASIC METABOLIC PANEL
Anion gap: 12 (ref 5–15)
BUN: 16 mg/dL (ref 6–20)
CO2: 31 mmol/L (ref 22–32)
Calcium: 8.6 mg/dL — ABNORMAL LOW (ref 8.9–10.3)
Chloride: 94 mmol/L — ABNORMAL LOW (ref 98–111)
Creatinine, Ser: 0.38 mg/dL — ABNORMAL LOW (ref 0.61–1.24)
GFR, Estimated: >60 mL/min (ref >60)
Glucose, Bld: 95 mg/dL (ref 70–99)
Potassium: 4.1 mmol/L (ref 3.5–5.1)
Sodium: 137 mmol/L (ref 135–145)

## 2023-08-16 LAB — MRSA NEXT GEN BY PCR, NASAL: MRSA by PCR Next Gen: NOT DETECTED

## 2023-08-16 LAB — SODIUM
Sodium: 136 mmol/L (ref 135–145)
Sodium: 134 mmol/L — ABNORMAL LOW (ref 135–145)

## 2023-08-16 MED ORDER — POTASSIUM CHLORIDE CRYS ER 20 MEQ PO TBCR
40.0000 meq | EXTENDED_RELEASE_TABLET | Freq: Once | ORAL | Status: AC
  Administered 2023-08-16: 40 meq via ORAL
  Filled 2023-08-16: qty 2

## 2023-08-16 MED ORDER — DEXTROSE 5 % IV SOLN: INTRAVENOUS | Status: AC

## 2023-08-16 NOTE — Consult Note (Signed)
WOC Nurse Consult Note: patient and mother are unaware of any areas of moisture, patient does not turn for WOC nurse to assess buttocks; mother uses lotions at home to help with any moisture associated skin damage  Reason for Consult: areas of moisture concern  Wound type: Intertriginous dermatitis underneath pannus and inner thigh region  Pressure Injury POA: NA  Measurement: minimal erythema underneath pannus R greater than L  Wound bed: mild erythema with partial thickness skin loss noted to R side  Drainage (amount, consistency, odor) minimal serous  Periwound: intact  Dressing procedure/placement/frequency: Cleanse underneath pannus and inner thigh area with Vashe wound cleanser Hart [place] [phone]), apply floor stock Microguard powder (green and white label) as needed to dry and protect.    POC discussed with patient and mother. WOC team will not follow. Re-consult if further needs arise.   WOC Nurse ostomy consult note; patient has a longstanding ileostomy (2014) that mother manages at home  Stoma type/location:  RLQ ileostomy  Stomal assessment/size: patient and mother defer for me to assess  Peristomal assessment: skin barrier intact (just placed yesterday by mother) and they defer for me to assess, no areas of concern  Treatment options for stomal/peristomal skin: 2" barrier ring  Output  has hooked to a bedside drainage bag from home so unable to determine amount of output, does have yellow effluent in pouch  Ostomy pouching: uses 2 piece 2 1/4 flat skin barrier Hart [place] [phone]) 2 1/4" high output pouch Hart [place] [phone]), mother has supplies from home and prefers to manage patients ileostomy herself; I did remove their bedside drainage bag and hook up to a hospital bedside drainage bag  Education provided:  none, mother very knowledgeable regarding ostomy  Enrolled patient in [place] [employer] DC program: no, established ostomy.   MOTHER HAS SUPPLIES FROM HOME AND PREFERS TO DO ALL CARE  TO ILEOSTOMY WHILE PATIENT IN HOSPITAL.  I did place a 2 1/4" skin barrier and barrier ring in room in case needed when mother out of room.   WOC team will not follow.  Re-consult if further needs arise.   Thank you,    Priscella Mann MSN, RN-BC, [employer] [phone]

## 2023-08-16 NOTE — Plan of Care (Signed)
Patient did well today.  Vitals stable. Continuing to monitor sodium levels.  Mother at bedside and prefers to do his care as much as possible.

## 2023-08-16 NOTE — Plan of Care (Signed)
  Problem: Education: Goal: Knowledge of General Education information will improve Description: Including pain rating scale, medication(s)/side effects and non-pharmacologic comfort measures Outcome: Progressing   Problem: Health Behavior/Discharge Planning: Goal: Ability to manage health-related needs will improve Outcome: Progressing   Problem: Clinical Measurements: Goal: Ability to maintain clinical measurements within normal limits will improve Outcome: Progressing   Problem: Elimination: Goal: Will not experience complications related to bowel motility Outcome: Progressing Goal: Will not experience complications related to urinary retention Outcome: Progressing   Problem: Pain Management: Goal: General experience of comfort will improve Outcome: Progressing   Problem: Skin Integrity: Goal: Risk for impaired skin integrity will decrease Outcome: Progressing

## 2023-08-16 NOTE — TOC Initial Note (Signed)
Transition of Care [facility]) - Initial/Assessment Note    Patient Details  Name: [name] MRN: [id number] Date of Birth: Apr 30, 1981  [facility]) CM/SW Contact:    Lanier Clam, RN Phone Number: 08/16/2023, 1:35 PM  Clinical Narrative:Paraplegic. Spoke to Sheila(mother)-d/c plan home. Has private duty care gives-Mother-manages ileostomy, adl's. Has own transport home.                  Expected Discharge Plan: Home/Self Care Barriers to Discharge: Continued Medical Work up   Patient Goals and CMS Choice Patient states their goals for this hospitalization and ongoing recovery are:: Home CMS Medicare.gov Compare Post Acute Care list provided to:: Patient Represenative (must comment) (Sheila(mother)) Choice offered to / list presented to : Parent [place] ownership interest in [facility].provided to:: Parent NA    Expected Discharge Plan and Services   Discharge Planning Services: CM Consult Post Acute Care Choice: Resumption of Svcs/PTA Provider Living arrangements for the past 2 months: Single Family Home                                      Prior Living Arrangements/Services Living arrangements for the past 2 months: Single Family Home Lives with:: Parents Patient language and need for interpreter reviewed:: Yes Do you feel safe going back to the place where you live?: Yes      Need for Family Participation in Patient Care: Yes (Comment) Care giver support system in place?: Yes (comment) Current home services: Other (comment) (pvt duty-Caregiver(mother)) Criminal Activity/Legal Involvement Pertinent to Current Situation/Hospitalization: No - Comment as needed  Activities of Daily Living   ADL Screening (condition at time of admission) Independently performs ADLs?: No Does the patient have a NEW difficulty with bathing/dressing/toileting/self-feeding that is expected to last >3 days?: No Does the patient have a NEW difficulty with  getting in/out of bed, walking, or climbing stairs that is expected to last >3 days?: No Does the patient have a NEW difficulty with communication that is expected to last >3 days?: No Is the patient deaf or have difficulty hearing?: No Does the patient have difficulty seeing, even when wearing glasses/contacts?: No Does the patient have difficulty concentrating, remembering, or making decisions?: No  Permission Sought/Granted Permission sought to share information with : Case Manager Permission granted to share information with : Yes, Verbal Permission Granted  Share Information with NAME: Case manager           Emotional Assessment Appearance:: Appears stated age Attitude/Demeanor/Rapport: Gracious Affect (typically observed): Accepting Orientation: : Oriented to Self, Oriented to Place, Oriented to  Time, Oriented to Situation Alcohol / Substance Use: Other (comment) Psych Involvement: No (comment)  Admission diagnosis:  Dehydration [E86.0] Hyponatremia [E87.1] 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] Patient Active Problem List   Diagnosis Date Noted   Hypokalemia 08/15/2023   Hypocalcemia 08/15/2023   Constipation 08/15/2023   Ileostomy care (HCC) 08/15/2023   History of seizure 08/15/2023   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   COVID 09/23/2021   COVID-19 virus infection 09/22/2021   Essential hypertension 12/23/2015   Hypotension 12/23/2015   Decubitus ulcer of buttock, stage 3 (HCC) 11/20/2015   Complicated UTI (urinary tract infection)    Sepsis secondary to UTI (HCC) 11/19/2015   Sepsis (HCC) 07/22/2015   AKI (acute kidney injury) (HCC) 07/22/2015   Hyperkalemia 07/22/2015   Hyponatremia 07/22/2015   Thrombocytosis 07/22/2015   Leukocytosis 07/22/2015  Borderline diabetes 07/22/2015   Arnold-Chiari malformation (HCC) 07/22/2015   Urinary tract infectious disease    Headache 11/06/2014   H/O ventricular shunt    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   Severe sepsis (HCC)  10/26/2014   Renal failure (ARF), acute on chronic (HCC) 10/26/2014   Severe sepsis with septic shock (HCC) 10/26/2014   Lower urinary tract infectious disease    Anemia in chronic renal disease 09/30/2013   Esophageal reflux 09/17/2013   Protein-calorie malnutrition, severe (HCC) 09/17/2013   Essential hypertension, benign 09/17/2013   Anemia of other chronic disease 09/17/2013   Septic shock (HCC) 07/09/2013   Spina bifida (HCC) 07/07/2013   Neurogenic bladder 07/07/2013   Elevated LFTs 07/07/2013   PCP:  Sharmon Revere, MD Pharmacy:   [id number] [place], [place] - [address] [facility] [address] [place] [address] Phone: [phone] Fax: [phone]     Social Determinants of Health (SDOH) Social History: SDOH Screenings   Food Insecurity: No Food Insecurity (08/16/2023)  Housing: Low Risk  (08/16/2023)  Transportation Needs: No Transportation Needs (08/16/2023)  Utilities: Not At Risk (08/16/2023)  Tobacco Use: Low Risk  (08/15/2023)   SDOH Interventions:     Readmission Risk Interventions     No data to display

## 2023-08-16 NOTE — Progress Notes (Addendum)
[facility] PROGRESS NOTE  [name] (DOB: 12/06/1980) WUJ:[id number] PCP: Sharmon Revere, MD  Brief Narrative: [name] is a 42 y.o. male with a history of congential paraplegia, spina bifida and hydrocephalus s/p VP shunt, neurogenic bladder with suprapubic catheter, ileostomy s/p right hemicolectomy, HTN who presented to the ED with dark urine, poor po intake and abdominal distention with decreased ostomy output.   He was afebrile, tachycardic, normotensive with leukocytosis (WBC 15k) and hyponatremic (Na 122) with AKI (Cr 0.76 from baseline 0.3-0.4) and evidence of hemoconcentration.    CT head with stable VP shunt. CT A/P showed mild distal small bowel dilatation with fecalization proximal to the right lower quadrant ileostomy suggesting bowel stasis. No focal transitional point to suggest mechanical obstruction. Large known ventral hernia without incarceration. General surgery was consulted, recommending bowel regimen. IVF started with abrupt increase in sodium for which D5W is started. Leukocytosis and AKI have similarly resolved without antibiotics.    Subjective: Abdominal distention back at/near baseline per pt and mother. Eating ok. Having liquid output from ileostomy.   Objective: BP 122/76 (BP Location: Right Arm)   Pulse 87   Temp 97.8 F (36.6 C) (Oral)   Resp 16   Ht 5\' 2"  (1.575 m)   Wt 74.4 kg   SpO2 100%   BMI 30.00 kg/m   Gen: No distress Pulm: Clear, nonlabored  CV: RRR, no MRG, no pitting edema GI: Soft, slightly distended, ostomy bag just exchanged by WOC. +BS. Significant abdominal scarring. Neuro: Alert and oriented. No new focal deficits. Ext: Warm, no deformities Skin: No new rashes, lesions or ulcers on visualized skin   Assessment & Plan: Hyponatremia: Appears relatively acute given abrupt increase with normal saline.  - Stop NS, change to D5W x1 liter to decrease rate of rise. Continue serial Na and full BMP in AM. Allow regular  diet  Constipation: CT abdomen/pelvis with fecalization proximal to the right lower quadrant ileostomy.  He was evaluated by general surgery at bedside.  Stoma was digitized with immediate rush of air.  Surgery was able to palpate thickened stool but no concerns for mechanical obstruction. - Continue po bowel regimen, prn suppository. Symptoms improving though output remains limited. Appreciate Surgery recommendations.   AKI: Improved with IVF, po intake improving. Good UOP.  - Avoid nephrotoxins.  - Push po.   HTN:  - Continue verapamil and metoprolol.   History of obstructive hydrocephalus s/p VP shunt, paraplegia: CT head showed stable VP shunt in place. At baseline.   History of seizure:  - Continue keppra  Hypocalcemia: Supplemented  Hypokalemia: Supplemented. Repeat.   Neurogenic bladder: with suprapubic catheter  Nocturnal hypoxemia: Noted in SDU.  - Suggest sleep study, at home if possible.   [name], MD [facility] [email or web address] 08/16/2023, 2:30 PM

## 2023-08-17 DIAGNOSIS — K5900 Constipation, unspecified: Secondary | ICD-10-CM | POA: Diagnosis not present

## 2023-08-17 LAB — BASIC METABOLIC PANEL
Anion gap: 9 (ref 5–15)
BUN: 19 mg/dL (ref 6–20)
CO2: 30 mmol/L (ref 22–32)
Calcium: 8.4 mg/dL — ABNORMAL LOW (ref 8.9–10.3)
Chloride: 95 mmol/L — ABNORMAL LOW (ref 98–111)
Creatinine, Ser: 0.59 mg/dL — ABNORMAL LOW (ref 0.61–1.24)
GFR, Estimated: >60 mL/min (ref >60)
Glucose, Bld: 93 mg/dL (ref 70–99)
Potassium: 4 mmol/L (ref 3.5–5.1)
Sodium: 134 mmol/L — ABNORMAL LOW (ref 135–145)

## 2023-08-17 MED ORDER — POLYETHYLENE GLYCOL 3350 17 G PO PACK: 17.0000 g | PACK | Freq: Every day | ORAL | 0 refills | Status: AC

## 2023-08-17 NOTE — TOC Transition Note (Signed)
Transition of Care [facility]) - CM/SW Discharge Note   Patient Details  Name: [name] MRN: [id number] Date of Birth: 1981-06-19  Transition of Care [facility]) CM/SW Contact:  Lanier Clam, RN Phone Number: 08/17/2023, 1:34 PM   Clinical Narrative: d/c home family has own transport. No needs or orders.      Final next level of care: Home/Self Care Barriers to Discharge: No Barriers Identified   Patient Goals and CMS Choice CMS Medicare.gov Compare Post Acute Care list provided to:: Patient Represenative (must comment) (Sheila(mother)) Choice offered to / list presented to : Parent  Discharge Placement                         Discharge Plan and Services Additional resources added to the After Visit Summary for     Discharge Planning Services: CM Consult Post Acute Care Choice: Resumption of Svcs/PTA Provider                               Social Determinants of Health (SDOH) Interventions SDOH Screenings   Food Insecurity: No Food Insecurity (08/16/2023)  Housing: Low Risk  (08/16/2023)  Transportation Needs: No Transportation Needs (08/16/2023)  Utilities: Not At Risk (08/16/2023)  Tobacco Use: Low Risk  (08/15/2023)     Readmission Risk Interventions     No data to display

## 2023-08-17 NOTE — Discharge Summary (Addendum)
Physician Discharge Summary   Patient: [name] MRN: [id number] DOB: Oct 30, 1980  Admit date:     08/15/2023  Discharge date: 08/17/2023  Discharge Physician: [name]   PCP: Sharmon Revere, MD   Recommendations at discharge:  Follow up with PCP, suggest recheck BMP within the next week.  Due to concern for nocturnal hypoxemia, suggest sleep study, at home if possible.   Discharge Diagnoses: Principal Problem:   Constipation Active Problems:   S/P VP shunt   Spina bifida (HCC)   Neurogenic bladder   Essential hypertension, benign   AKI (acute kidney injury) (HCC)   Hyponatremia   Hypokalemia   Hypocalcemia   Ileostomy care (HCC)   History of seizure  Hospital Course: [name] is a 42 y.o. male with a history of congential paraplegia, spina bifida and hydrocephalus s/p VP shunt, neurogenic bladder with suprapubic catheter, ileostomy s/p right hemicolectomy, HTN who presented to the ED with dark urine, poor po intake and abdominal distention with decreased ostomy output.    He was afebrile, tachycardic, normotensive with leukocytosis (WBC 15k) and hyponatremic (Na 122) with AKI (Cr 0.76 from baseline 0.3-0.4) and evidence of hemoconcentration.    CT head with stable VP shunt. CT A/P showed mild distal small bowel dilatation with fecalization proximal to the right lower quadrant ileostomy suggesting bowel stasis. No focal transitional point to suggest mechanical obstruction. Large known ventral hernia without incarceration. General surgery was consulted, recommending bowel regimen. IVF started with abrupt increase in sodium for which D5W was given with acceptable correction rate thereafter. Leukocytosis and AKI have similarly resolved without antibiotics.   Assessment and Plan: Hyponatremia: Appears relatively acute given abrupt increase with normal saline. Improved, level is 134 on day of discharge.  - Suggest regular diet and recheck BMP at follow up    Constipation: CT abdomen/pelvis with fecalization proximal to the right lower quadrant ileostomy.  He was evaluated by general surgery at bedside.  Stoma was digitized with immediate rush of air.  Surgery was able to palpate thickened stool but no concerns for mechanical obstruction. - Continue po bowel regimen, prn suppository. Symptoms resolved. Will continue daily miralax with instructions to titrate   AKI: Improved with IVF, po intake improving. Good UOP.  - Avoid nephrotoxins.  - Push po.    HTN:  - Continue verapamil and metoprolol.    History of obstructive hydrocephalus s/p VP shunt, paraplegia: CT head showed stable VP shunt in place. At baseline.   History of seizure:  - Continue keppra   Hypocalcemia: Supplemented   Hypokalemia: Supplemented. Repeat.   Neurogenic bladder: with suprapubic catheter   Nocturnal hypoxemia: Noted in SDU.  - Suggest sleep study, at home if possible.   Consultants: Surgery Procedures performed: None  Disposition: Home Diet recommendation:  Regular diet DISCHARGE MEDICATION: Allergies as of 08/17/2023       Reactions   Latex Hives   Succinylcholine Rash, Other (See Comments)   Pt is half [place] - prolonged paralysis due to compromised metabolism. Succinylcholine given on 07/07/15 at outside hospital and pt experienced paralysis > 3 hours.  Due to being native [place], his mother states that he takes longer to "come out of it Pt is half [place] - prolonged paralysis due to compromised metabolism. Succinylcholine given on 07/07/15 at outside hospital and pt experienced paralysis > 3 hours.   Ciprofloxacin Other (See Comments)   IV only-- caused burning in arm    Vancomycin Itching, Swelling  Adhesive [tape] Hives, Rash   Wound Dressing Adhesive Hives, Rash        Medication List     TAKE these medications    acetaminophen 650 MG CR tablet Commonly known as: TYLENOL Take 1,300 mg by mouth every 8 (eight) hours as  needed for pain.   calcium elemental as carbonate 400 MG chewable tablet Commonly known as: BARIATRIC TUMS ULTRA Chew 1,000 mg by mouth daily.   Calcium-Magnesium-Zinc-D3 Tabs Take 1 tablet by mouth in the morning and at bedtime.   Cranberry-Vitamin C-Vitamin E 4200-20-3 MG-MG-UNIT Caps Take 1 capsule by mouth in the morning, at noon, and at bedtime.   Klor-Con M20 20 MEQ tablet Generic drug: potassium chloride SA Take 20 mEq by mouth See admin instructions. Take 20 mEq by mouth twice weekly on Monday and Thursday   levETIRAcetam 500 MG tablet Commonly known as: KEPPRA Take 500 mg by mouth 2 (two) times daily.   liver oil-zinc oxide 40 % ointment Commonly known as: DESITIN Apply 1 Application topically as needed for irritation.   metoprolol tartrate 50 MG tablet Commonly known as: LOPRESSOR Take 50 mg by mouth 2 (two) times daily.   multivitamin with minerals Tabs tablet Take 1 tablet by mouth daily in the afternoon.   nystatin cream Commonly known as: MYCOSTATIN Apply 1 Application topically 2 (two) times daily.   ondansetron 4 MG tablet Commonly known as: ZOFRAN Take 4 mg by mouth every 8 (eight) hours as needed for nausea or vomiting.   oxybutynin 5 MG tablet Commonly known as: DITROPAN Take 5 mg by mouth 2 (two) times daily.   polyethylene glycol 17 g packet Commonly known as: MIRALAX / GLYCOLAX Take 17 g by mouth daily.   rosuvastatin 20 MG tablet Commonly known as: CRESTOR Take 20 mg by mouth in the morning.   verapamil 120 MG CR tablet Commonly known as: CALAN-SR Take 120 mg by mouth at bedtime.   vitamin C 1000 MG tablet Take 1,000 mg by mouth daily with lunch.        Follow-up Information     Paliwal, Himanshu, MD Follow up.   Specialty: Family Medicine Contact information: [address] [place] [address] [phone]                Discharge Exam: Ceasar Mons Weights   08/15/23 1115  Weight: 74.4 kg  BP 116/67   Pulse 70    Temp 98.1 F (36.7 C) (Oral)   Resp 16   Ht 5\' 2"  (1.575 m)   Wt 74.4 kg   SpO2 100%   BMI 30.00 kg/m   No distress, says he feels great, wants to go home and is "stomach is flat as a pancake."  Clear, nonlabored RRR, no MRG Soft, NT, ND, stable scars, good ileostomy output without erythema or discharge.   Condition at discharge: good  The results of significant diagnostics from this hospitalization (including imaging, microbiology, ancillary and laboratory) are listed below for reference.   Imaging Studies: CT ABDOMEN PELVIS WO CONTRAST  Result Date: 08/15/2023 CLINICAL DATA:  Abdominal bloating with nausea and vomiting since yesterday. Suprapubic catheter and ileostomy. Concern for bowel obstruction. EXAM: CT ABDOMEN AND PELVIS WITHOUT CONTRAST TECHNIQUE: Multidetector CT imaging of the abdomen and pelvis was performed following the standard protocol without IV contrast. RADIATION DOSE REDUCTION: This exam was performed according to the departmental dose-optimization program which includes automated exposure control, adjustment of the mA and/or kV according to patient size and/or use of iterative  reconstruction technique. COMPARISON:  Abdominopelvic CT 01/12/2021 FINDINGS: Lower chest: Clear lung bases. No significant pleural or pericardial effusion. Hepatobiliary: The liver appears stable, without focal abnormality on noncontrast imaging. Stable asymmetric enlargement of the left lobe. Status post cholecystectomy without evidence of significant biliary dilatation. Pancreas: Unremarkable. No pancreatic ductal dilatation or surrounding inflammatory changes. Spleen: Normal in size without focal abnormality. Adrenals/Urinary Tract: Both adrenal glands appear normal. No evidence of urinary tract calculus, suspicious renal lesion or hydronephrosis. Suprapubic bladder catheter remains in place. The bladder is decompressed. Stomach/Bowel: No enteric contrast administered. Again demonstrated is a  large broad-based chronic ventral hernia containing portions of the distal stomach and multiple loops of small bowel. The stomach appears unremarkable for its degree of distention. There is mild dilatation of the distal small bowel with fecalization proximal to the right lower quadrant ileostomy. Hartman pouch consisting of nearly the entire colon is unchanged, decompressed. The right colon has been previously resected. Vascular/Lymphatic: There are no enlarged abdominal or pelvic lymph nodes. No significant vascular findings on noncontrast imaging. Reproductive: The prostate gland and seminal vesicles are unremarkable. Other: As above, large chronic ventral hernia containing portions of the stomach and small bowel. No evidence of incarceration. No ascites or pneumoperitoneum. Musculoskeletal: Stable postsurgical changes from thoracolumbar spinal fixation with underlying spinal dysraphism. No acute osseous findings are evident. Severe diffuse muscular atrophy. Unchanged ventricular peritoneal shunt tubing terminating in the subcutaneous tissues of the anterior right upper quadrant. IMPRESSION: 1. Mild dilatation of the distal small bowel with fecalization proximal to the right lower quadrant ileostomy suggesting bowel stasis. No focal transition point to suggest mechanical obstruction. 2. Large chronic ventral hernia containing portions of the distal stomach and multiple loops of small bowel. No evidence of incarceration. 3. Stable postsurgical changes from right hemicolectomy, ileostomy and long segment Hartman pouch. 4. Suprapubic bladder catheter remains in place. The bladder is decompressed. 5. Stable postsurgical changes from thoracolumbar spinal fixation with underlying spinal dysraphism and severe diffuse muscular atrophy. Electronically Signed   By: Carey Bullocks M.D.   On: 08/15/2023 15:43   CT Head Wo Contrast  Result Date: 08/15/2023 CLINICAL DATA:  Headache vomiting EXAM: CT HEAD WITHOUT CONTRAST  TECHNIQUE: Contiguous axial images were obtained from the base of the skull through the vertex without intravenous contrast. RADIATION DOSE REDUCTION: This exam was performed according to the departmental dose-optimization program which includes automated exposure control, adjustment of the mA and/or kV according to patient size and/or use of iterative reconstruction technique. COMPARISON:  Head CT 05/23/2023 FINDINGS: Brain: No acute territorial infarction, hemorrhage, or intracranial mass. Right posterior shunt catheter with tip at the left frontal horn, stable in position. No interval ventricular a large mint. Effaced fourth ventricle as before. 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 Vascular: No hyperdense vessels.  No unexpected calcification. Skull: No fracture.  Hyperostosis Sinuses/Orbits: No acute finding. Other: None IMPRESSION: 1. No CT evidence for acute intracranial abnormality. 2. Right posterior shunt catheter with tip at or near the left frontal horn, stable in position. Stable ventricle size and configuration with no interval enlargement compared to the comparison study. 3. 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 Electronically Signed   By: Jasmine Pang M.D.   On: 08/15/2023 15:35   DG Skull 1-3 Views  Result Date: 08/15/2023 CLINICAL DATA:  Possible shunt malfunction headache nausea vomiting EXAM: SKULL - 1-3 VIEW; CHEST  1 VIEW; ABDOMEN - 1 VIEW COMPARISON:  05/23/2023 FINDINGS: Frontal  and lateral views of the skull demonstrate similar positioning of right-sided intracranial shunt with radiolucent portion over the right occipital area. Partially visualized abandoned shunt tubing in the right neck. Single-view chest demonstrates intact shunt tubing more medially along the right chest, with the tip positioned over the SVC region. Abandoned shunt fragments at the right neck and slightly more lateral right chest.  Hypoventilatory changes. Posterior spinal hardware. Supine views of the abdomen demonstrate posterior spinal hardware with chronic fracture through left-sided hardware. Multiple loops of air distended bowel measuring up to 6.5 cm concerning for obstruction, bowel loops appear to project within the pannus suggesting a hernia. Chronic acetabular dysplasia. IMPRESSION: 1. Intact shunt tubing with tip terminating in the SVC region. Abandoned shunt fragments as discussed above. 2. Multiple loops of air distended bowel measuring up to 6.5 cm concerning for obstruction, bowel loops appear to project within the pannus suggesting a hernia. . Electronically Signed   By: Jasmine Pang M.D.   On: 08/15/2023 15:15   DG Chest 1 View  Result Date: 08/15/2023 CLINICAL DATA:  Possible shunt malfunction headache nausea vomiting EXAM: SKULL - 1-3 VIEW; CHEST  1 VIEW; ABDOMEN - 1 VIEW COMPARISON:  05/23/2023 FINDINGS: Frontal and lateral views of the skull demonstrate similar positioning of right-sided intracranial shunt with radiolucent portion over the right occipital area. Partially visualized abandoned shunt tubing in the right neck. Single-view chest demonstrates intact shunt tubing more medially along the right chest, with the tip positioned over the SVC region. Abandoned shunt fragments at the right neck and slightly more lateral right chest. Hypoventilatory changes. Posterior spinal hardware. Supine views of the abdomen demonstrate posterior spinal hardware with chronic fracture through left-sided hardware. Multiple loops of air distended bowel measuring up to 6.5 cm concerning for obstruction, bowel loops appear to project within the pannus suggesting a hernia. Chronic acetabular dysplasia. IMPRESSION: 1. Intact shunt tubing with tip terminating in the SVC region. Abandoned shunt fragments as discussed above. 2. Multiple loops of air distended bowel measuring up to 6.5 cm concerning for obstruction, bowel loops appear to  project within the pannus suggesting a hernia. . Electronically Signed   By: Jasmine Pang M.D.   On: 08/15/2023 15:15   DG Abd 1 View  Result Date: 08/15/2023 CLINICAL DATA:  Possible shunt malfunction headache nausea vomiting EXAM: SKULL - 1-3 VIEW; CHEST  1 VIEW; ABDOMEN - 1 VIEW COMPARISON:  05/23/2023 FINDINGS: Frontal and lateral views of the skull demonstrate similar positioning of right-sided intracranial shunt with radiolucent portion over the right occipital area. Partially visualized abandoned shunt tubing in the right neck. Single-view chest demonstrates intact shunt tubing more medially along the right chest, with the tip positioned over the SVC region. Abandoned shunt fragments at the right neck and slightly more lateral right chest. Hypoventilatory changes. Posterior spinal hardware. Supine views of the abdomen demonstrate posterior spinal hardware with chronic fracture through left-sided hardware. Multiple loops of air distended bowel measuring up to 6.5 cm concerning for obstruction, bowel loops appear to project within the pannus suggesting a hernia. Chronic acetabular dysplasia. IMPRESSION: 1. Intact shunt tubing with tip terminating in the SVC region. Abandoned shunt fragments as discussed above. 2. Multiple loops of air distended bowel measuring up to 6.5 cm concerning for obstruction, bowel loops appear to project within the pannus suggesting a hernia. . Electronically Signed   By: Jasmine Pang M.D.   On: 08/15/2023 15:15   DG Cervical Spine 1 View  Result Date: 08/15/2023 CLINICAL DATA:  Headache, nausea and vomiting. EXAM: DG CERVICAL SPINE - 1 VIEW COMPARISON:  05/23/2023 FINDINGS: The included portions of the current right ventricular shunt are intact without significant change. A shunt fragment in the right neck is unchanged. Thoracic spine fixation hardware is again demonstrated. Cervical spine abnormality visualized in the frontal projection. IMPRESSION: No significant change.  The included portion of the current right ventricular shunt catheter remains intact. Electronically Signed   By: Beckie Salts M.D.   On: 08/15/2023 15:11    Microbiology: Results for orders placed or performed during the hospital encounter of 08/15/23  MRSA Next Gen by PCR, Nasal     Status: None   Collection Time: 08/15/23 12:02 AM   Specimen: Nasal Mucosa; Nasal Swab  Result Value Ref Range Status   MRSA by PCR Next Gen NOT DETECTED NOT DETECTED Final    Comment: (NOTE) The GeneXpert MRSA Assay (FDA approved for NASAL specimens only), is one component of a comprehensive MRSA colonization surveillance program. It is not intended to diagnose MRSA infection nor to guide or monitor treatment for MRSA infections. Test performance is not FDA approved in patients less than 65 years old. Performed at [facility], [address], [place], [place] [address]     Labs: CBC: Recent Labs  Lab 08/15/23 1530 08/16/23 0700  WBC 15.1* 8.5  NEUTROABS 11.9*  --   HGB 17.0 14.8  HCT 53.2* 47.2  MCV 79.6* 81.8  PLT 300 248   Basic Metabolic Panel: Recent Labs  Lab 08/15/23 1247 08/15/23 2221 08/16/23 0700 08/16/23 1203 08/16/23 2051 08/17/23 0252  NA 122* 130* 137 136 134* 134*  K 3.1* 3.1* 4.1  --   --  4.0  CL 80* 89* 94*  --   --  95*  CO2 27 30 31   --   --  30  GLUCOSE 131* 160* 95  --   --  93  BUN 41* 21* 16  --   --  19  CREATININE 0.76 0.57* 0.38*  --   --  0.59*  CALCIUM 8.3* 8.1* 8.6*  --   --  8.4*  MG  --  1.8  --   --   --   --    Liver Function Tests: Recent Labs  Lab 08/15/23 1247  AST 27  ALT 59*  ALKPHOS 74  BILITOT 1.1  PROT 7.7  ALBUMIN 3.8   CBG: No results for input(s): "GLUCAP" in the last 168 hours.  Discharge time spent: greater than 30 minutes.  Signed: [name], MD [facility] 08/17/2023

## 2023-08-17 NOTE — Plan of Care (Signed)
  Problem: Education: Goal: Knowledge of General Education information will improve Description: Including pain rating scale, medication(s)/side effects and non-pharmacologic comfort measures Outcome: Progressing   Problem: Clinical Measurements: Goal: Ability to maintain clinical measurements within normal limits will improve Outcome: Progressing Goal: Will remain free from infection Outcome: Progressing Goal: Diagnostic test results will improve Outcome: Progressing Goal: Respiratory complications will improve Outcome: Progressing Goal: Cardiovascular complication will be avoided Outcome: Progressing   Problem: Nutrition: Goal: Adequate nutrition will be maintained Outcome: Progressing   Problem: Coping: Goal: Level of anxiety will decrease Outcome: Progressing   Problem: Elimination: Goal: Will not experience complications related to bowel motility Outcome: Progressing Goal: Will not experience complications related to urinary retention Outcome: Progressing   Problem: Pain Management: Goal: General experience of comfort will improve Outcome: Progressing   Problem: Safety: Goal: Ability to remain free from injury will improve Outcome: Progressing   Problem: Skin Integrity: Goal: Risk for impaired skin integrity will decrease Outcome: Progressing   Problem: Education: Goal: Knowledge of General Education information will improve Description: Including pain rating scale, medication(s)/side effects and non-pharmacologic comfort measures Outcome: Progressing   Problem: Clinical Measurements: Goal: Ability to maintain clinical measurements within normal limits will improve Outcome: Progressing Goal: Will remain free from infection Outcome: Progressing Goal: Diagnostic test results will improve Outcome: Progressing Goal: Respiratory complications will improve Outcome: Progressing Goal: Cardiovascular complication will be avoided Outcome: Progressing   Problem:  Nutrition: Goal: Adequate nutrition will be maintained Outcome: Progressing   Problem: Coping: Goal: Level of anxiety will decrease Outcome: Progressing   Problem: Elimination: Goal: Will not experience complications related to bowel motility Outcome: Progressing Goal: Will not experience complications related to urinary retention Outcome: Progressing   Problem: Pain Management: Goal: General experience of comfort will improve Outcome: Progressing   Problem: Safety: Goal: Ability to remain free from injury will improve Outcome: Progressing   Problem: Skin Integrity: Goal: Risk for impaired skin integrity will decrease Outcome: Progressing   Problem: Health Behavior/Discharge Planning: Goal: Ability to manage health-related needs will improve Outcome: Completed/Met   Problem: Activity: Goal: Risk for activity intolerance will decrease Outcome: Not Applicable

## 2023-10-26 ENCOUNTER — Inpatient Hospital Stay (HOSPITAL_COMMUNITY)
Admission: EM | Admit: 2023-10-26 | Discharge: 2023-10-28 | DRG: 389 | Disposition: A | Discharge status: Home / Self Care | Payer: 59 | Attending: Internal Medicine | Admitting: Internal Medicine

## 2023-10-26 ENCOUNTER — Other Ambulatory Visit: Payer: Self-pay

## 2023-10-26 ENCOUNTER — Emergency Department (HOSPITAL_COMMUNITY): Payer: 59

## 2023-10-26 ENCOUNTER — Encounter (HOSPITAL_COMMUNITY): Payer: Self-pay

## 2023-10-26 DIAGNOSIS — G4734 Idiopathic sleep related nonobstructive alveolar hypoventilation: Secondary | ICD-10-CM | POA: Insufficient documentation

## 2023-10-26 DIAGNOSIS — K56600 Partial intestinal obstruction, unspecified as to cause: Secondary | ICD-10-CM

## 2023-10-26 DIAGNOSIS — K567 Ileus, unspecified: Secondary | ICD-10-CM | POA: Diagnosis not present

## 2023-10-26 DIAGNOSIS — Z888 Allergy status to other drugs, medicaments and biological substances status: Secondary | ICD-10-CM

## 2023-10-26 DIAGNOSIS — K529 Noninfective gastroenteritis and colitis, unspecified: Secondary | ICD-10-CM

## 2023-10-26 DIAGNOSIS — G808 Other cerebral palsy: Secondary | ICD-10-CM | POA: Diagnosis present

## 2023-10-26 DIAGNOSIS — Z932 Ileostomy status: Secondary | ICD-10-CM

## 2023-10-26 DIAGNOSIS — Z6830 Body mass index (BMI) 30.0-30.9, adult: Secondary | ICD-10-CM

## 2023-10-26 DIAGNOSIS — N319 Neuromuscular dysfunction of bladder, unspecified: Secondary | ICD-10-CM | POA: Diagnosis present

## 2023-10-26 DIAGNOSIS — K439 Ventral hernia without obstruction or gangrene: Secondary | ICD-10-CM | POA: Diagnosis present

## 2023-10-26 DIAGNOSIS — K219 Gastro-esophageal reflux disease without esophagitis: Secondary | ICD-10-CM | POA: Diagnosis present

## 2023-10-26 DIAGNOSIS — E785 Hyperlipidemia, unspecified: Secondary | ICD-10-CM | POA: Diagnosis present

## 2023-10-26 DIAGNOSIS — Q0703 Arnold-Chiari syndrome with spina bifida and hydrocephalus: Secondary | ICD-10-CM

## 2023-10-26 DIAGNOSIS — Z881 Allergy status to other antibiotic agents status: Secondary | ICD-10-CM

## 2023-10-26 DIAGNOSIS — Z79899 Other long term (current) drug therapy: Secondary | ICD-10-CM

## 2023-10-26 DIAGNOSIS — I10 Essential (primary) hypertension: Secondary | ICD-10-CM | POA: Diagnosis present

## 2023-10-26 DIAGNOSIS — E66811 Obesity, class 1: Secondary | ICD-10-CM | POA: Diagnosis present

## 2023-10-26 DIAGNOSIS — Z91048 Other nonmedicinal substance allergy status: Secondary | ICD-10-CM

## 2023-10-26 DIAGNOSIS — E860 Dehydration: Secondary | ICD-10-CM | POA: Diagnosis present

## 2023-10-26 DIAGNOSIS — K592 Neurogenic bowel, not elsewhere classified: Secondary | ICD-10-CM | POA: Diagnosis present

## 2023-10-26 DIAGNOSIS — Z933 Colostomy status: Secondary | ICD-10-CM

## 2023-10-26 DIAGNOSIS — E871 Hypo-osmolality and hyponatremia: Principal | ICD-10-CM | POA: Diagnosis present

## 2023-10-26 DIAGNOSIS — G839 Paralytic syndrome, unspecified: Secondary | ICD-10-CM | POA: Diagnosis present

## 2023-10-26 DIAGNOSIS — G40909 Epilepsy, unspecified, not intractable, without status epilepticus: Secondary | ICD-10-CM | POA: Diagnosis present

## 2023-10-26 DIAGNOSIS — Z8249 Family history of ischemic heart disease and other diseases of the circulatory system: Secondary | ICD-10-CM

## 2023-10-26 DIAGNOSIS — Z9889 Other specified postprocedural states: Secondary | ICD-10-CM

## 2023-10-26 DIAGNOSIS — Z833 Family history of diabetes mellitus: Secondary | ICD-10-CM

## 2023-10-26 DIAGNOSIS — Q059 Spina bifida, unspecified: Secondary | ICD-10-CM

## 2023-10-26 DIAGNOSIS — Z993 Dependence on wheelchair: Secondary | ICD-10-CM

## 2023-10-26 DIAGNOSIS — Z91040 Latex allergy status: Secondary | ICD-10-CM

## 2023-10-26 DIAGNOSIS — Z823 Family history of stroke: Secondary | ICD-10-CM

## 2023-10-26 DIAGNOSIS — Z982 Presence of cerebrospinal fluid drainage device: Secondary | ICD-10-CM

## 2023-10-26 DIAGNOSIS — E878 Other disorders of electrolyte and fluid balance, not elsewhere classified: Secondary | ICD-10-CM | POA: Insufficient documentation

## 2023-10-26 LAB — CBC
HCT: 46.4 % (ref 39.0–52.0)
Hemoglobin: 15.6 g/dL (ref 13.0–17.0)
MCH: 26.6 pg (ref 26.0–34.0)
MCHC: 33.6 g/dL (ref 30.0–36.0)
MCV: 79 fL — ABNORMAL LOW (ref 80.0–100.0)
Platelets: 301 10*3/uL (ref 150–400)
RBC: 5.87 MIL/uL — ABNORMAL HIGH (ref 4.22–5.81)
RDW: 13.8 % (ref 11.5–15.5)
WBC: 16.1 10*3/uL — ABNORMAL HIGH (ref 4.0–10.5)
nRBC: 0 % (ref 0.0–0.2)

## 2023-10-26 LAB — COMPREHENSIVE METABOLIC PANEL
ALT: 32 U/L (ref 0–44)
AST: 19 U/L (ref 15–41)
Albumin: 3.1 g/dL — ABNORMAL LOW (ref 3.5–5.0)
Alkaline Phosphatase: 57 U/L (ref 38–126)
Anion gap: 14 (ref 5–15)
BUN: 28 mg/dL — ABNORMAL HIGH (ref 6–20)
CO2: 22 mmol/L (ref 22–32)
Calcium: 8.6 mg/dL — ABNORMAL LOW (ref 8.9–10.3)
Chloride: 90 mmol/L — ABNORMAL LOW (ref 98–111)
Creatinine, Ser: 0.63 mg/dL (ref 0.61–1.24)
GFR, Estimated: >60 mL/min (ref >60)
Glucose, Bld: 114 mg/dL — ABNORMAL HIGH (ref 70–99)
Potassium: 4.9 mmol/L (ref 3.5–5.1)
Sodium: 126 mmol/L — ABNORMAL LOW (ref 135–145)
Total Bilirubin: 0.9 mg/dL (ref 0.0–1.2)
Total Protein: 6.7 g/dL (ref 6.5–8.1)

## 2023-10-26 LAB — LIPASE, BLOOD: Lipase: 18 U/L (ref 11–51)

## 2023-10-26 LAB — I-STAT CG4 LACTIC ACID, ED: Lactic Acid, Venous: 1.1 mmol/L (ref 0.5–1.9)

## 2023-10-26 MED ORDER — IOHEXOL 350 MG/ML SOLN
75.0000 mL | Freq: Once | INTRAVENOUS | Status: AC | PRN
  Administered 2023-10-26: 75 mL via INTRAVENOUS

## 2023-10-26 NOTE — ED Triage Notes (Signed)
Pt BIB [facility] from home for 3 days of abd pain/swelling around colostomy site. Pt also endorses loss of appetite. No N/V. Normal BM.  142/90 93 HR 132 cbg

## 2023-10-26 NOTE — ED Provider Triage Note (Signed)
Emergency Medicine Provider Triage Evaluation Note  [name] , a 43 y.o. male  was evaluated in triage.  Pt complains of abdominal distention, nausea, 5 out of 10 abdominal discomfort, and fatigue.  Review of Systems  Positive: Fatigue, abdominal pain, distention, nausea. Negative: Vomiting, fevers, chills, congestion, cough, chest pain, shortness of breath  Physical Exam  BP 107/64 (BP Location: Right Arm)   Pulse 92   Temp 97.8 F (36.6 C) (Oral)   Resp 17   Ht 5\' 2"  (1.575 m)   Wt 74.4 kg   SpO2 99%   BMI 30.00 kg/m  Gen:   Awake, no distress  , sitting in chair Resp:  Normal effort , lungs clear bilaterally MSK:   Moves extremities without difficulty  Other:  Abdomen has surgical scars and is distended.  Tender to palpation.  Bowel sounds were appreciated.  Patient has ostomy with output in bag and has a suprapubic catheter that has cloudy sediment filled urine.  He does report history of UTIs causing abdominal pain.  Medical Decision Making  Medically screening exam initiated at 8:04 PM.  Appropriate orders placed.  [name] was informed that the remainder of the evaluation will be completed by another provider, this initial triage assessment does not replace that evaluation, and the importance of remaining in the ED until their evaluation is complete.  [name] is a 43 y.o. male with a past medical history significant for spina bifida with neurogenic bladder with suprapubic catheter dependence, VP shunt, previous kidney stones, seizures, congenital paraplegia, and colostomy who presents with 3 days of nausea, worsening abdominal distention, and abdominal pain.  He also has some fatigue.  He says that he has never had complications with his ostomy to his knowledge but the pain is a 5 out of 10 in severity.  He reports has been growing in size and hurting.  He reports no vomiting beside the nausea.  Denies any dysuria but he has urethral catheter.  He is report  looks cloudy and has had history of infections before.  Denying back or flank pain.  Denies any headache neck pain chest pain or shortness of breath.  No cough or URI symptoms.  On exam, patient has numerous surgical scars on his abdomen.  It is distended and tender on palpation.  He I did hear some bowel sounds.  Patient otherwise resting.  Suprapubic catheter has cloudy sediment in place.  Clinically I do feel need to rule out complication with his abdomen or partial obstruction.  Will order CT scan and he will get labs.  Will get urinalysis given my concern for UTI potentially causing his symptoms.  Anticipate full evaluation when he gets to a room and workup is completed.     Analy Bassford, Canary Brim, MD 10/26/23 2013

## 2023-10-26 NOTE — ED Notes (Signed)
Pt called for vitals x2 with no answer

## 2023-10-27 ENCOUNTER — Encounter (HOSPITAL_COMMUNITY): Payer: Self-pay | Admitting: Internal Medicine

## 2023-10-27 DIAGNOSIS — B999 Unspecified infectious disease: Secondary | ICD-10-CM

## 2023-10-27 DIAGNOSIS — E878 Other disorders of electrolyte and fluid balance, not elsewhere classified: Secondary | ICD-10-CM | POA: Diagnosis present

## 2023-10-27 DIAGNOSIS — G4734 Idiopathic sleep related nonobstructive alveolar hypoventilation: Secondary | ICD-10-CM

## 2023-10-27 DIAGNOSIS — E785 Hyperlipidemia, unspecified: Secondary | ICD-10-CM | POA: Diagnosis present

## 2023-10-27 DIAGNOSIS — E871 Hypo-osmolality and hyponatremia: Secondary | ICD-10-CM

## 2023-10-27 DIAGNOSIS — I10 Essential (primary) hypertension: Secondary | ICD-10-CM | POA: Diagnosis present

## 2023-10-27 DIAGNOSIS — K567 Ileus, unspecified: Secondary | ICD-10-CM | POA: Diagnosis present

## 2023-10-27 DIAGNOSIS — G808 Other cerebral palsy: Secondary | ICD-10-CM | POA: Diagnosis present

## 2023-10-27 DIAGNOSIS — Z6830 Body mass index (BMI) 30.0-30.9, adult: Secondary | ICD-10-CM | POA: Diagnosis not present

## 2023-10-27 DIAGNOSIS — Z933 Colostomy status: Secondary | ICD-10-CM | POA: Diagnosis not present

## 2023-10-27 DIAGNOSIS — Z982 Presence of cerebrospinal fluid drainage device: Secondary | ICD-10-CM | POA: Diagnosis not present

## 2023-10-27 DIAGNOSIS — K529 Noninfective gastroenteritis and colitis, unspecified: Secondary | ICD-10-CM

## 2023-10-27 DIAGNOSIS — K56600 Partial intestinal obstruction, unspecified as to cause: Secondary | ICD-10-CM

## 2023-10-27 DIAGNOSIS — Z833 Family history of diabetes mellitus: Secondary | ICD-10-CM | POA: Diagnosis not present

## 2023-10-27 DIAGNOSIS — Z91040 Latex allergy status: Secondary | ICD-10-CM | POA: Diagnosis not present

## 2023-10-27 DIAGNOSIS — Z932 Ileostomy status: Secondary | ICD-10-CM | POA: Diagnosis not present

## 2023-10-27 DIAGNOSIS — G839 Paralytic syndrome, unspecified: Secondary | ICD-10-CM | POA: Diagnosis present

## 2023-10-27 DIAGNOSIS — Z8249 Family history of ischemic heart disease and other diseases of the circulatory system: Secondary | ICD-10-CM | POA: Diagnosis not present

## 2023-10-27 DIAGNOSIS — E66811 Obesity, class 1: Secondary | ICD-10-CM | POA: Diagnosis present

## 2023-10-27 DIAGNOSIS — Q0703 Arnold-Chiari syndrome with spina bifida and hydrocephalus: Secondary | ICD-10-CM | POA: Diagnosis not present

## 2023-10-27 DIAGNOSIS — Z823 Family history of stroke: Secondary | ICD-10-CM | POA: Diagnosis not present

## 2023-10-27 DIAGNOSIS — N319 Neuromuscular dysfunction of bladder, unspecified: Secondary | ICD-10-CM | POA: Diagnosis present

## 2023-10-27 DIAGNOSIS — Z881 Allergy status to other antibiotic agents status: Secondary | ICD-10-CM | POA: Diagnosis not present

## 2023-10-27 DIAGNOSIS — Z993 Dependence on wheelchair: Secondary | ICD-10-CM | POA: Diagnosis not present

## 2023-10-27 DIAGNOSIS — G40909 Epilepsy, unspecified, not intractable, without status epilepticus: Secondary | ICD-10-CM | POA: Diagnosis present

## 2023-10-27 DIAGNOSIS — K592 Neurogenic bowel, not elsewhere classified: Secondary | ICD-10-CM | POA: Diagnosis present

## 2023-10-27 DIAGNOSIS — E860 Dehydration: Secondary | ICD-10-CM | POA: Diagnosis present

## 2023-10-27 LAB — BASIC METABOLIC PANEL
Anion gap: 17 — ABNORMAL HIGH (ref 5–15)
Anion gap: 11 (ref 5–15)
Anion gap: 12 (ref 5–15)
Anion gap: 11 (ref 5–15)
BUN: 21 mg/dL — ABNORMAL HIGH (ref 6–20)
BUN: 15 mg/dL (ref 6–20)
BUN: 10 mg/dL (ref 6–20)
BUN: 11 mg/dL (ref 6–20)
CO2: 23 mmol/L (ref 22–32)
CO2: 23 mmol/L (ref 22–32)
CO2: 26 mmol/L (ref 22–32)
CO2: 25 mmol/L (ref 22–32)
Calcium: 8.8 mg/dL — ABNORMAL LOW (ref 8.9–10.3)
Calcium: 8.3 mg/dL — ABNORMAL LOW (ref 8.9–10.3)
Calcium: 8.5 mg/dL — ABNORMAL LOW (ref 8.9–10.3)
Calcium: 8.4 mg/dL — ABNORMAL LOW (ref 8.9–10.3)
Chloride: 89 mmol/L — ABNORMAL LOW (ref 98–111)
Chloride: 92 mmol/L — ABNORMAL LOW (ref 98–111)
Chloride: 94 mmol/L — ABNORMAL LOW (ref 98–111)
Chloride: 95 mmol/L — ABNORMAL LOW (ref 98–111)
Creatinine, Ser: 0.57 mg/dL — ABNORMAL LOW (ref 0.61–1.24)
Creatinine, Ser: 0.53 mg/dL — ABNORMAL LOW (ref 0.61–1.24)
Creatinine, Ser: 0.5 mg/dL — ABNORMAL LOW (ref 0.61–1.24)
Creatinine, Ser: 0.56 mg/dL — ABNORMAL LOW (ref 0.61–1.24)
GFR, Estimated: >60 mL/min (ref >60)
GFR, Estimated: >60 mL/min (ref >60)
GFR, Estimated: >60 mL/min (ref >60)
GFR, Estimated: >60 mL/min (ref >60)
Glucose, Bld: 90 mg/dL (ref 70–99)
Glucose, Bld: 134 mg/dL — ABNORMAL HIGH (ref 70–99)
Glucose, Bld: 103 mg/dL — ABNORMAL HIGH (ref 70–99)
Glucose, Bld: 115 mg/dL — ABNORMAL HIGH (ref 70–99)
Potassium: 5.1 mmol/L (ref 3.5–5.1)
Potassium: 5.5 mmol/L — ABNORMAL HIGH (ref 3.5–5.1)
Potassium: 5 mmol/L (ref 3.5–5.1)
Potassium: 4.9 mmol/L (ref 3.5–5.1)
Sodium: 129 mmol/L — ABNORMAL LOW (ref 135–145)
Sodium: 126 mmol/L — ABNORMAL LOW (ref 135–145)
Sodium: 132 mmol/L — ABNORMAL LOW (ref 135–145)
Sodium: 131 mmol/L — ABNORMAL LOW (ref 135–145)

## 2023-10-27 LAB — URINALYSIS, ROUTINE W REFLEX MICROSCOPIC
Bilirubin Urine: NEGATIVE
Glucose, UA: NEGATIVE mg/dL
Ketones, ur: 5 mg/dL — AB
Nitrite: NEGATIVE
Protein, ur: 30 mg/dL — AB
Specific Gravity, Urine: 1.035 — ABNORMAL HIGH (ref 1.005–1.030)
WBC, UA: >50 WBC/hpf (ref 0–5)
pH: 7 (ref 5.0–8.0)

## 2023-10-27 LAB — CBC
HCT: 45.7 % (ref 39.0–52.0)
Hemoglobin: 14.9 g/dL (ref 13.0–17.0)
MCH: 25.9 pg — ABNORMAL LOW (ref 26.0–34.0)
MCHC: 32.6 g/dL (ref 30.0–36.0)
MCV: 79.5 fL — ABNORMAL LOW (ref 80.0–100.0)
Platelets: 291 10*3/uL (ref 150–400)
RBC: 5.75 MIL/uL (ref 4.22–5.81)
RDW: 13.8 % (ref 11.5–15.5)
WBC: 11.6 10*3/uL — ABNORMAL HIGH (ref 4.0–10.5)
nRBC: 0 % (ref 0.0–0.2)

## 2023-10-27 LAB — HIV ANTIBODY (ROUTINE TESTING W REFLEX): HIV Screen 4th Generation wRfx: NONREACTIVE

## 2023-10-27 LAB — GASTROINTESTINAL PANEL BY PCR, STOOL (REPLACES STOOL CULTURE)

## 2023-10-27 LAB — OSMOLALITY: Osmolality: 280 mosm/kg (ref 275–295)

## 2023-10-27 MED ORDER — METOPROLOL TARTRATE 50 MG PO TABS
50.0000 mg | ORAL_TABLET | Freq: Two times a day (BID) | ORAL | Status: DC
  Administered 2023-10-27 – 2023-10-28 (×3): 50 mg via ORAL
  Filled 2023-10-27: qty 1
  Filled 2023-10-27: qty 2
  Filled 2023-10-27: qty 1

## 2023-10-27 MED ORDER — CALCIUM CARBONATE ANTACID 500 MG PO CHEW: 1000.0000 mg | CHEWABLE_TABLET | Freq: Every day | ORAL | Status: DC

## 2023-10-27 MED ORDER — SODIUM CHLORIDE 0.9 % IV SOLN
2.0000 g | Freq: Once | INTRAVENOUS | Status: AC
  Administered 2023-10-27: 2 g via INTRAVENOUS
  Filled 2023-10-27: qty 20

## 2023-10-27 MED ORDER — POLYETHYLENE GLYCOL 3350 17 G PO PACK
17.0000 g | PACK | Freq: Every day | ORAL | Status: DC
Start: 2023-10-27 — End: 2023-10-28
  Administered 2023-10-27 – 2023-10-28 (×2): 17 g via ORAL
  Filled 2023-10-27 (×2): qty 1

## 2023-10-27 MED ORDER — ONDANSETRON HCL 4 MG PO TABS: 4.0000 mg | ORAL_TABLET | Freq: Four times a day (QID) | ORAL | Status: DC | PRN

## 2023-10-27 MED ORDER — SENNOSIDES-DOCUSATE SODIUM 8.6-50 MG PO TABS
1.0000 | ORAL_TABLET | Freq: Two times a day (BID) | ORAL | Status: DC
  Administered 2023-10-27 (×3): 1 via ORAL
  Filled 2023-10-27 (×4): qty 1

## 2023-10-27 MED ORDER — SODIUM CHLORIDE 0.9 % IV SOLN
2.0000 g | INTRAVENOUS | Status: DC
  Administered 2023-10-28: 2 g via INTRAVENOUS
  Filled 2023-10-27: qty 20

## 2023-10-27 MED ORDER — SODIUM CHLORIDE 0.9 % IV SOLN: 250.0000 mL | INTRAVENOUS | Status: AC | PRN

## 2023-10-27 MED ORDER — OXYBUTYNIN CHLORIDE 5 MG PO TABS
5.0000 mg | ORAL_TABLET | Freq: Two times a day (BID) | ORAL | Status: DC
  Administered 2023-10-27 – 2023-10-28 (×4): 5 mg via ORAL
  Filled 2023-10-27 (×4): qty 1

## 2023-10-27 MED ORDER — HEPARIN SODIUM (PORCINE) 5000 UNIT/ML IJ SOLN
5000.0000 [IU] | Freq: Three times a day (TID) | INTRAMUSCULAR | Status: DC
  Administered 2023-10-27 – 2023-10-28 (×4): 5000 [IU] via SUBCUTANEOUS
  Filled 2023-10-27 (×4): qty 1

## 2023-10-27 MED ORDER — SODIUM CHLORIDE 0.9 % IV SOLN: 2.0000 g | INTRAVENOUS | Status: DC

## 2023-10-27 MED ORDER — SODIUM CHLORIDE 0.9% FLUSH
3.0000 mL | Freq: Two times a day (BID) | INTRAVENOUS | Status: DC
  Administered 2023-10-27 – 2023-10-28 (×4): 3 mL via INTRAVENOUS

## 2023-10-27 MED ORDER — SODIUM CHLORIDE 0.9 % IV SOLN: INTRAVENOUS | Status: AC

## 2023-10-27 MED ORDER — SODIUM CHLORIDE 0.9% FLUSH: 3.0000 mL | INTRAVENOUS | Status: DC | PRN

## 2023-10-27 MED ORDER — ACETAMINOPHEN 325 MG PO TABS: 650.0000 mg | ORAL_TABLET | Freq: Four times a day (QID) | ORAL | Status: DC | PRN

## 2023-10-27 MED ORDER — METRONIDAZOLE 500 MG/100ML IV SOLN
500.0000 mg | Freq: Two times a day (BID) | INTRAVENOUS | Status: DC
  Administered 2023-10-27 (×3): 500 mg via INTRAVENOUS
  Filled 2023-10-27 (×4): qty 100

## 2023-10-27 MED ORDER — ONDANSETRON HCL 4 MG/2ML IJ SOLN: 4.0000 mg | Freq: Four times a day (QID) | INTRAMUSCULAR | Status: DC | PRN

## 2023-10-27 MED ORDER — LEVETIRACETAM 500 MG PO TABS
500.0000 mg | ORAL_TABLET | Freq: Two times a day (BID) | ORAL | Status: DC
  Administered 2023-10-27 – 2023-10-28 (×4): 500 mg via ORAL
  Filled 2023-10-27 (×4): qty 1

## 2023-10-27 MED ORDER — ACETAMINOPHEN 650 MG RE SUPP: 650.0000 mg | Freq: Four times a day (QID) | RECTAL | Status: DC | PRN

## 2023-10-27 MED ORDER — VITAMIN C 500 MG PO TABS: 1000.0000 mg | ORAL_TABLET | Freq: Every day | ORAL | Status: DC

## 2023-10-27 MED ORDER — ROSUVASTATIN CALCIUM 20 MG PO TABS
20.0000 mg | ORAL_TABLET | Freq: Every day | ORAL | Status: DC
  Administered 2023-10-27 – 2023-10-28 (×2): 20 mg via ORAL
  Filled 2023-10-27 (×2): qty 1

## 2023-10-27 NOTE — Consult Note (Signed)
WOC Nurse ostomy consult note Stoma type/location: high output ileostomy  Patient uses dilator for stricture at home. Patient and mother independent in ostomy care. Patient using high output pouch and high output flow  Output yellow, high volumes since presentation to ED Dr. Cliffton Asters from general surgery has evaluated, no other ostomy needs Ostomy pouching: 2pc.Order SuppliesHart [place] # [phone] 2 3/4" high output pouch/ [employer] # [phone] 2 3/4" skin barrier/ [employer] [place] # [phone] 2" barrier ring  Education provided: Discussed with bedside nursing, high flow tubing and collection devices are not available in patient.  And the high flow tubing will not connect to BSD foley catheter bags.   Will need to convert patient to high output pouch 2 3/4 and connect to bedside foley bag.  Patient can covert back to coloplast or convatec high flow tubing and pouching system   Re consult if needed, will not follow at this time. Thanks  Shermika Balthaser [employer], RN,CWOCN, CNS, CWON-AP [phone])

## 2023-10-27 NOTE — ED Provider Notes (Signed)
[place] EMERGENCY DEPARTMENT AT [facility] Provider Note   CSN: [id number] Arrival date & time: 10/26/23  1723     History  Chief Complaint  Patient presents with   Abdominal Pain    [name] is a 43 y.o. male.  Patient brought to the emergency department for evaluation of abdominal pain and distention.  Patient has a history of spina bifida with congenital paraplegia.  He has an ileostomy present.  Mother reports that his abdomen has been distended and he has been complaining of abdominal pain for 3 days.  He has had little or no stool out.       Home Medications Prior to Admission medications   Medication Sig Start Date End Date Taking? Authorizing Provider  acetaminophen (TYLENOL) 650 MG CR tablet Take 1,300 mg by mouth every 8 (eight) hours as needed for pain.    [provider]  Ascorbic Acid (VITAMIN C) 1000 MG tablet Take 1,000 mg by mouth daily with lunch.    [provider]  calcium elemental as carbonate (BARIATRIC TUMS ULTRA) 400 MG chewable tablet Chew 1,000 mg by mouth daily.    [provider]  Cranberry-Vitamin C-Vitamin E 4200-20-3 MG-MG-UNIT CAPS Take 1 capsule by mouth in the morning, at noon, and at bedtime.    [provider]  KLOR-CON M20 20 MEQ tablet Take 20 mEq by mouth See admin instructions. Take 20 mEq by mouth twice weekly on Monday and Thursday 03/14/23   [provider]  levETIRAcetam (KEPPRA) 500 MG tablet Take 500 mg by mouth 2 (two) times daily.    [provider]  liver oil-zinc oxide (DESITIN) 40 % ointment Apply 1 Application topically as needed for irritation.    [provider]  metoprolol tartrate (LOPRESSOR) 50 MG tablet Take 50 mg by mouth 2 (two) times daily.    [provider]  Multiple Minerals-Vitamins (CALCIUM-MAGNESIUM-ZINC-D3) TABS Take 1 tablet by mouth in the morning and at bedtime.    [provider]  Multiple Vitamin (MULTIVITAMIN  WITH MINERALS) TABS tablet Take 1 tablet by mouth daily in the afternoon.    [provider]  nystatin cream (MYCOSTATIN) Apply 1 Application topically 2 (two) times daily. 05/01/23   [provider]  ondansetron (ZOFRAN) 4 MG tablet Take 4 mg by mouth every 8 (eight) hours as needed for nausea or vomiting. 03/14/23   [provider]  oxybutynin (DITROPAN) 5 MG tablet Take 5 mg by mouth 2 (two) times daily. 10/03/19   [provider]  polyethylene glycol (MIRALAX / GLYCOLAX) 17 g packet Take 17 g by mouth daily. 08/17/23   [name], MD  rosuvastatin (CRESTOR) 20 MG tablet Take 20 mg by mouth in the morning. 09/10/18   [provider]  verapamil (CALAN-SR) 120 MG CR tablet Take 120 mg by mouth at bedtime. 09/26/19   [provider]      Allergies    Latex, Succinylcholine, Ciprofloxacin, Vancomycin, Adhesive [tape], and Wound dressing adhesive    Review of Systems   Review of Systems  Physical Exam Updated Vital Signs BP 102/63 (BP Location: Left Arm)   Pulse 89   Temp 98 F (36.7 C)   Resp 16   Ht 5\' 2"  (1.575 m)   Wt 74.4 kg   SpO2 100%   BMI 30.00 kg/m  Physical Exam Vitals and nursing note reviewed.  Constitutional:      General: He is not in acute distress.  Appearance: He is well-developed.  HENT:     Head: Normocephalic and atraumatic.     Mouth/Throat:     Mouth: Mucous membranes are moist.  Eyes:     General: Vision grossly intact. Gaze aligned appropriately.     Extraocular Movements: Extraocular movements intact.     Conjunctiva/sclera: Conjunctivae normal.  Cardiovascular:     Rate and Rhythm: Normal rate and regular rhythm.     Pulses: Normal pulses.     Heart sounds: Normal heart sounds, S1 normal and S2 normal. No murmur heard.    No friction rub. No gallop.  Pulmonary:     Effort: Pulmonary effort is normal. No respiratory distress.     Breath sounds: Normal breath sounds.  Abdominal:      Palpations: Abdomen is soft.     Tenderness: There is no abdominal tenderness. There is no guarding or rebound.     Hernia: No hernia is present.  Musculoskeletal:        General: No swelling.     Cervical back: Full passive range of motion without pain, normal range of motion and neck supple. No pain with movement, spinous process tenderness or muscular tenderness. Normal range of motion.     Right lower leg: No edema.     Left lower leg: No edema.  Skin:    General: Skin is warm and dry.     Capillary Refill: Capillary refill takes less than 2 seconds.     Findings: No ecchymosis, erythema, lesion or wound.  Neurological:     Mental Status: He is alert and oriented to person, place, and time.     GCS: GCS eye subscore is 4. GCS verbal subscore is 5. GCS motor subscore is 6.     Cranial Nerves: Cranial nerves 2-12 are intact.     Sensory: Sensation is intact.     Motor: Motor function is intact. No weakness or abnormal muscle tone.     Coordination: Coordination is intact.  Psychiatric:        Mood and Affect: Mood normal.        Speech: Speech normal.        Behavior: Behavior normal.     ED Results / Procedures / Treatments   Labs (all labs ordered are listed, but only abnormal results are displayed) Labs Reviewed  COMPREHENSIVE METABOLIC PANEL - Abnormal; Notable for the following components:      Result Value   Sodium 126 (*)    Chloride 90 (*)    Glucose, Bld 114 (*)    BUN 28 (*)    Calcium 8.6 (*)    Albumin 3.1 (*)    All other components within normal limits  CBC - Abnormal; Notable for the following components:   WBC 16.1 (*)    RBC 5.87 (*)    MCV 79.0 (*)    All other components within normal limits  LIPASE, BLOOD  URINALYSIS, ROUTINE W REFLEX MICROSCOPIC  I-STAT CG4 LACTIC ACID, ED    EKG None  Radiology CT ABDOMEN PELVIS W CONTRAST Result Date: 10/26/2023 CLINICAL DATA:  Three days of abdominal distention, nausea, abdominal pain. Cloudy urine. Rule  out partial obstruction. EXAM: CT ABDOMEN AND PELVIS WITH CONTRAST TECHNIQUE: Multidetector CT imaging of the abdomen and pelvis was performed using the standard protocol following bolus administration of intravenous contrast. RADIATION DOSE REDUCTION: This exam was performed according to the departmental dose-optimization program which includes automated exposure control, adjustment of the mA and/or kV according  to patient size and/or use of iterative reconstruction technique. CONTRAST:  75mL OMNIPAQUE IOHEXOL 350 MG/ML SOLN COMPARISON:  CT 08/15/2023 FINDINGS: Lower chest: No acute abnormality. Hepatobiliary: Cholecystectomy. No acute abnormality. Unremarkable liver. Pancreas: Unremarkable. Spleen: Unremarkable. Adrenals/Urinary Tract: Stable adrenal glands. No urinary calculi or hydronephrosis. Foley catheter in the nondistended bladder. Bladder wall thickening and mucosal hyperenhancement with perivesical stranding. Locules of gas in the bladder. Stomach/Bowel: Similar large broad-based chronic ventral hernia containing portions of the distal stomach, multiple loops of small bowel. Stomach is within normal limits. The small bowel is dilated measuring up to 5.0 cm in diameter. Possible transition point at the ostomy (series 7/image 29). Fluid-filled small bowel with mural thickening and hyperenhancement of the wall and valvulae conniventes. Right colectomy. 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 Vascular/Lymphatic: No significant vascular findings are present. No enlarged abdominal or pelvic lymph nodes. Reproductive: No acute abnormality. Other: No free intraperitoneal fluid or air. VP shunt tubing terminating in the subcutaneous fat of the right lower quadrant. Musculoskeletal: Stable postsurgical changes from thoracolumbar spinal fixation and underlying spinal dysraphism. IMPRESSION: 1. There is wall thickening and mucosal hyperenhancement of the small bowel compatible with  enteritis. 2. Dilated small bowel extending to the right lower quadrant ostomy. Possible transition point at the ostomy. Findings are indeterminate for ileus from enteritis or partial obstruction at the ostomy. 3. Bladder wall thickening and mucosal hyperenhancement with perivesical stranding. Correlate with urinalysis to exclude cystitis. 4. Similar large broad-based chronic ventral hernia containing portions of the distal stomach, multiple loops of small bowel. Electronically Signed   By: Minerva Fester M.D.   On: 10/26/2023 22:13    Procedures Procedures    Medications Ordered in ED Medications  iohexol (OMNIPAQUE) 350 MG/ML injection 75 mL (75 mLs Intravenous Contrast Given 10/26/23 2157)    ED Course/ Medical Decision Making/ A&P                                 Medical Decision Making Amount and/or Complexity of Data Reviewed Labs: ordered.   Differential Diagnosis considered includes, but not limited to: Cholelithiasis; cholecystitis; cholangitis; bowel obstruction; esophagitis; gastritis; peptic ulcer disease; pancreatitis; cardiac.  Patient presents for evaluation of abdominal pain and distention.  Patient had workup initiated through provider in triage.  Patient with leukocytosis, mild hyponatremia.  CT scan performed concerning for ileus versus small bowel obstruction with transition point at ostomy site.  Upon evaluation, patient's abdomen is now softer, per mother.  He has started to put out a small amount of stool.  Suspect that he has resolving ileus.  Discussed with Dr. Cliffton Asters, general surgery.  Recommends hospitalist observe patient overnight to ensure that he continues to put out stool.  Surgery will see on rounds in the morning.        Final Clinical Impression(s) / ED Diagnoses Final diagnoses:  Hyponatremia  Ileus [facility])    Rx / DC Orders ED Discharge Orders     None         Tinya Cadogan, Canary Brim, MD 10/27/23 [phone]

## 2023-10-27 NOTE — Progress Notes (Signed)
[facility]                                                                              Bright Spielmann, is a 43 y.o. male, DOB - 13-Jul-1981, BJY:[id number] Admit date - 10/26/2023    Outpatient Primary MD for the patient is Paliwal, Himanshu, MD  LOS - 0  days  Chief Complaint  Patient presents with   Abdominal Pain       Brief summary   Patient is a 43 year old male with congenital paraplegia, spina bifida, hydrocephalus status post VP shunt, neurogenic bladder with suprapubic catheter, ileostomy status post right hemicolectomy, hypertension, seizure, chronic hyponatremia, nocturnal hypoxia presented to ED with abdominal distention, pain for the last 3 days, on little to no stool output from his ileostomy site.  CT abdomen showed wall thickening and mucosal hyperenhancement of the small bowel compatible with enteritis.  Dilated small bowel extending to the right lower quadrant ostomy, possible transition point at the ostomy, findings indeterminate for ileus, enteritis or partial obstruction of the ostomy General Surgery was consulted.  Assessment & Plan    Principal Problem: Ileus from enteritis versus partial obstruction of the ostomy High-output ileostomy - Presented with abdominal distention, pain, poor stool output through the ileostomy site - CT abdomen pelvis showed enteritis, development of ileus from enteritis or partial obstruction at the ostomy site.  Chronic ventral hernia containing distal stomach and multiple loops of the small bowel. - General Surgery was consulted.   -Currently copious amounts of stool and patient is not having any pain or tenderness on exam  -Examined by surgery, per Dr. Cliffton Asters, does not appear to be obstructed any longer.  Digitalize stoma has been successful for him.  Revision may be worthwhile considering long-term plan to reduce this from happening again -Diet advanced to full liquids -Copious amounts of stool, high risk for  dehydration, increased IV fluids -Continue IV Rocephin, Flagyl.  GI pathogen panel negative -Wound care following  Acute on chronic hyponatremia Hypochloremia -Presented initially with abdominal pain, distention, poor stool output through the ostomy site, workup revealed ileus versus early P SBO.  No vomiting -No vomiting - Now having copious stool output with high risk of dehydration -Sodium had trended down to 126, will place on aggressive IV fluid hydration with normal saline, increased to 125 cc an hour     History of spina bifida with paraplegia History of neurogenic bladder status post suprapubic catheter -Continue supra-pubic catheter management.   Nocturnal hypoxia -Continue to check pulse ox and supplemental oxygen at the bedtime to keep O2 sat 92%.   History of seizure -Continue Keppra 500 mg twice daily.   History of obstructive hydrocephalus s/p  VP shunt placement - Currently no acute issues, no headaches   Essential hypertension - BP currently soft, continue beta-blocker, hold verapamil   Hyperlipidemia - Continue Crestor     Obesity class I Estimated body mass index is 30 kg/m as calculated from the following:   Height as of this encounter: 5\' 2"  (1.575 m).   Weight as of this encounter: 74.4 kg.  Code Status: Full code DVT Prophylaxis:  heparin injection  5,000 Units Start: 10/27/23 0600 SCDs Start: 10/27/23 0101 Place TED hose Start: 10/27/23 0101   Level of Care: Level of care: Progressive Family Communication: Updated patient' Disposition Plan:      Remains inpatient appropriate: Hopefully DC in 24 to 48 hours   Procedures:    Consultants:   General surgery  Antimicrobials:   Anti-infectives (From admission, onward)    Start     Dose/Rate Route Frequency Ordered Stop   10/28/23 0800  cefTRIAXone (ROCEPHIN) 2 g in sodium chloride 0.9 % 100 mL IVPB        2 g 200 mL/hr over 30 Minutes Intravenous Every 24 hours 10/27/23 0111 11/03/23  0759   10/28/23 0000  cefTRIAXone (ROCEPHIN) 2 g in sodium chloride 0.9 % 100 mL IVPB  Status:  Discontinued        2 g 200 mL/hr over 30 Minutes Intravenous Every 24 hours 10/27/23 0100 10/27/23 0111   10/27/23 0115  metroNIDAZOLE (FLAGYL) IVPB 500 mg        500 mg 100 mL/hr over 60 Minutes Intravenous Every 12 hours 10/27/23 0100 11/02/23 2159   10/27/23 0115  cefTRIAXone (ROCEPHIN) 2 g in sodium chloride 0.9 % 100 mL IVPB        2 g 200 mL/hr over 30 Minutes Intravenous  Once 10/27/23 0107 10/27/23 0218          Medications  heparin  5,000 Units Subcutaneous Q8H   levETIRAcetam  500 mg Oral BID   metoprolol tartrate  50 mg Oral BID   oxybutynin  5 mg Oral BID   polyethylene glycol  17 g Oral Daily   rosuvastatin  20 mg Oral Daily   senna-docusate  1 tablet Oral BID   sodium chloride flush  3 mL Intravenous Q12H      Subjective:   Ardine Bjork was seen and examined today.  Seen this morning, overall improving, now has copious amounts of stool in the ileostomy.  Patient denies dizziness, chest pain, shortness of breath, abdominal pain, fevers. No acute events overnight.    Objective:   Vitals:   10/27/23 0447 10/27/23 0515 10/27/23 0530 10/27/23 0615  BP:  (!) 110/54 (!) 102/90 128/69  Pulse:  98 (!) 101 (!) 107  Resp:    16  Temp: 98 F (36.7 C)   97.7 F (36.5 C)  TempSrc: Oral   Oral  SpO2:  100% 98% 100%  Weight:      Height:        Intake/Output Summary (Last 24 hours) at 10/27/2023 1244 Last data filed at 10/27/2023 0615 Gross per 24 hour  Intake --  Output 1000 ml  Net -1000 ml     Wt Readings from Last 3 Encounters:  10/26/23 74.4 kg  08/15/23 74.4 kg  06/02/23 74.4 kg     Exam General: Alert and oriented x 3, NAD Cardiovascular: S1 S2 auscultated,  RRR Respiratory: Clear to auscultation bilaterally, no wheezing Gastrointestinal: Soft, ostomy with copious stool, hooked to suction device Ext: no pedal edema bilaterally Neuro: no new  deficits Psych: Normal affect     Data Reviewed:  I have personally reviewed following labs    CBC Lab Results  Component Value Date   WBC 11.6 (H) 10/27/2023   RBC 5.75 10/27/2023   HGB 14.9 10/27/2023   HCT 45.7 10/27/2023   MCV 79.5 (L) 10/27/2023   MCH 25.9 (L) 10/27/2023   PLT 291 10/27/2023   MCHC 32.6 10/27/2023   RDW 13.8  10/27/2023   LYMPHSABS 1.9 08/15/2023   MONOABS 1.2 (H) 08/15/2023   EOSABS 0.0 08/15/2023   BASOSABS 0.0 08/15/2023     Last metabolic panel Lab Results  Component Value Date   NA 126 (L) 10/27/2023   K 5.5 (H) 10/27/2023   CL 92 (L) 10/27/2023   CO2 23 10/27/2023   BUN 15 10/27/2023   CREATININE 0.53 (L) 10/27/2023   GLUCOSE 134 (H) 10/27/2023   GFRNONAA >60 10/27/2023   GFRAA >60 11/05/2019   CALCIUM 8.3 (L) 10/27/2023   PHOS 4.5 11/20/2015   PROT 6.7 10/26/2023   ALBUMIN 3.1 (L) 10/26/2023   BILITOT 0.9 10/26/2023   ALKPHOS 57 10/26/2023   AST 19 10/26/2023   ALT 32 10/26/2023   ANIONGAP 11 10/27/2023    CBG (last 3)  No results for input(s): "GLUCAP" in the last 72 hours.    Coagulation Profile: No results for input(s): "INR", "PROTIME" in the last 168 hours.   Radiology Studies: I have personally reviewed the imaging studies  CT ABDOMEN PELVIS W CONTRAST Result Date: 10/26/2023 CLINICAL DATA:  Three days of abdominal distention, nausea, abdominal pain. Cloudy urine. Rule out partial obstruction. EXAM: CT ABDOMEN AND PELVIS WITH CONTRAST TECHNIQUE: Multidetector CT imaging of the abdomen and pelvis was performed using the standard protocol following bolus administration of intravenous contrast. RADIATION DOSE REDUCTION: This exam was performed according to the departmental dose-optimization program which includes automated exposure control, adjustment of the mA and/or kV according to patient size and/or use of iterative reconstruction technique. CONTRAST:  75mL OMNIPAQUE IOHEXOL 350 MG/ML SOLN COMPARISON:  CT 08/15/2023  FINDINGS: Lower chest: No acute abnormality. Hepatobiliary: Cholecystectomy. No acute abnormality. Unremarkable liver. Pancreas: Unremarkable. Spleen: Unremarkable. Adrenals/Urinary Tract: Stable adrenal glands. No urinary calculi or hydronephrosis. Foley catheter in the nondistended bladder. Bladder wall thickening and mucosal hyperenhancement with perivesical stranding. Locules of gas in the bladder. Stomach/Bowel: Similar large broad-based chronic ventral hernia containing portions of the distal stomach, multiple loops of small bowel. Stomach is within normal limits. The small bowel is dilated measuring up to 5.0 cm in diameter. Possible transition point at the ostomy (series 7/image 29). Fluid-filled small bowel with mural thickening and hyperenhancement of the wall and valvulae conniventes. Right colectomy. 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 Vascular/Lymphatic: No significant vascular findings are present. No enlarged abdominal or pelvic lymph nodes. Reproductive: No acute abnormality. Other: No free intraperitoneal fluid or air. VP shunt tubing terminating in the subcutaneous fat of the right lower quadrant. Musculoskeletal: Stable postsurgical changes from thoracolumbar spinal fixation and underlying spinal dysraphism. IMPRESSION: 1. There is wall thickening and mucosal hyperenhancement of the small bowel compatible with enteritis. 2. Dilated small bowel extending to the right lower quadrant ostomy. Possible transition point at the ostomy. Findings are indeterminate for ileus from enteritis or partial obstruction at the ostomy. 3. Bladder wall thickening and mucosal hyperenhancement with perivesical stranding. Correlate with urinalysis to exclude cystitis. 4. Similar large broad-based chronic ventral hernia containing portions of the distal stomach, multiple loops of small bowel. Electronically Signed   By: Minerva Fester M.D.   On: 10/26/2023 22:13       Latiffany Harwick M.D. [facility] 10/27/2023, 12:44 PM  Available via Epic secure chat 7am-7pm After 7 pm, please refer to night coverage provider listed on amion.

## 2023-10-27 NOTE — H&P (Addendum)
History and Physical    [name] BJY:[id number] DOB: 12/20/1980 DOA: 10/26/2023  PCP: Sharmon Revere, MD   Patient coming from: Home   Chief Complaint:  Chief Complaint  Patient presents with   Abdominal Pain   ED TRIAGE note:Pt BIB [facility] from home for 3 days of abd pain/swelling around colostomy site. Pt also endorses loss of appetite. No N/V. Normal BM.  142/90 93 HR 132 cbg  HPI:  [name] is a 43 y.o. male with medical history significant of congential paraplegia, spina bifida and hydrocephalus s/p VP shunt, neurogenic bladder with suprapubic catheter, ileostomy s/p right hemicolectomy, essential hypertension, seizure, history of chronic hyponatremia and nocturnal hypoxia presented to emergency department by patient's mother with complaining of abdominal distention, abdominal pain for last 3 days as well as patient has little no stool output from the ileostomy site.  During my evaluation at the bedside on physical exam no tenderness on abdominal exam.  Patient denies any abdominal pain.  Denies any nausea, vomiting and diarrhea.  Mother at the bedside reported that recently patient has been having high volume of urine output and he is drinking a lot of water to compensate that.  Patient denies any fever and chill.  No other complaint at this time.   ED Course:  At presentation to ED patient found hypotensive blood pressure dropped to 98/51 otherwise hemodynamically stable. CBC showing leukocytosis 16.1 otherwise unremarkable. Lactic acid 1.1 WNL. CMP showing low sodium 126, low chloride 90, elevated BUN 28, low albumin 3.1.  CT abdomen pelvis showed: IMPRESSION: 1. There is wall thickening and mucosal hyperenhancement of the small bowel compatible with enteritis. 2. Dilated small bowel extending to the right lower quadrant ostomy. Possible transition point at the ostomy. Findings are indeterminate for ileus from enteritis or partial obstruction at the  ostomy. 3. Bladder wall thickening and mucosal hyperenhancement with perivesical stranding. Correlate with urinalysis to exclude cystitis. 4. Similar large broad-based chronic ventral hernia containing portions of the distal stomach, multiple loops of small bowel.  With the concern for development of ileus versus partial obstruction at the ostomy site ED physician spoke with general surgery Dr. Cliffton Asters who recommended admit patient for observation to make sure that patient has good stool output and general surgery will round on this patient in the morning.  Hospitalist has been contacted for further management enteritis, hyponatremia, ileus versus small bowel obstruction.  Significant labs in the ED: Lab Orders         Culture, blood (Routine X 2) w Reflex to ID Panel         Gastrointestinal Panel by PCR , Stool         Lipase, blood         Comprehensive metabolic panel         CBC         Urinalysis, Routine w reflex microscopic -Urine, Clean Catch         HIV Antibody (routine testing w rflx)         Basic metabolic panel         CBC       Review of Systems:  Review of Systems  Constitutional:  Negative for chills, fever, malaise/fatigue and weight loss.  Respiratory:  Negative for cough, sputum production and shortness of breath.   Cardiovascular:  Negative for chest pain, palpitations and leg swelling.  Gastrointestinal:  Positive for constipation. Negative for abdominal pain, diarrhea, heartburn, nausea and vomiting.  Genitourinary:  Negative  for dysuria, frequency and urgency.  Musculoskeletal:  Negative for back pain, joint pain, myalgias and neck pain.  Neurological:  Negative for dizziness, tremors, seizures, loss of consciousness, weakness and headaches.  Psychiatric/Behavioral:  The patient is not nervous/anxious.   All other systems reviewed and are negative.   Past Medical History:  Diagnosis Date   Arnold-Chiari malformation (HCC)    Congenital paraplegia (HCC)     MID-ABDOMIN DOWN  S/P SPINA BIFIDA   GERD (gastroesophageal reflux disease)    Heart palpitations    SECONDARY TO VP SHUNT DRAIN   History of kidney stones    History of seizure    per pt mother, no seizures since age 48   Hypertension    Ileostomy in place [facility]) 2014   Migraines followed by neurologist @[facility]    taking verapamil   Neurogenic bladder    INCONTINENT -   Neurogenic bowel    S/P VP shunt    Spina bifida with hydrocephalus, lumbar region (HCC)    CONGENITAL--   VP SHUNT in place--  PARALYZIED MID ABDOMEN DOWN   Urinary incontinence with continuous leakage    Wears glasses    Wheelchair bound    CAN TRASFER SELF WITHOUT BOARD    Past Surgical History:  Procedure Laterality Date   BACK SURGERY  infant   "get skin to stretch over his back; spinal bifida"   BACK SURGERY  age 67   scoliosis and  removed hump on his back (kyphosis) and put rods in to stabalize back"   BOTOX INJECTION N/A 11/14/2018   Procedure: BOTOX INJECTION CYSTOSCOPY;  Surgeon: Crista Elliot, MD;  Location: [facility];  Service: Urology;  Laterality: N/A;   BOTOX INJECTION N/A 05/22/2019   Procedure: BOTOX INJECTION;  Surgeon: Crista Elliot, MD;  Location: [facility];  Service: Urology;  Laterality: N/A;   BOTOX INJECTION N/A 11/13/2019   Procedure: CYSTOSCOPY BOTOX INJECTION;  Surgeon: Crista Elliot, MD;  Location: [facility];  Service: Urology;  Laterality: N/A;   BOTOX INJECTION N/A 07/01/2020   Procedure: CYSTOSCOPY, BOTOX INJECTION, BLADDER FULGERATION, SUPRAPUBIC TUBE EXCHANGE;  Surgeon: Rene Paci, MD;  Location: [facility];  Service: Urology;  Laterality: N/A;   BOTOX INJECTION N/A 12/14/2020   Procedure: BOTOX INJECTION 300 UNITS SUPRAPUBIC TUBE CHANGE;  Surgeon: Crista Elliot, MD;  Location: [facility];  Service: Urology;  Laterality: N/A;   BOTOX INJECTION N/A 06/02/2023   Procedure: CYSTOSCOPY BOTOX INJECTION 200 UNITS;  Surgeon: Crista Elliot, MD;  Location: [facility];  Service:  Urology;  Laterality: N/A;  1 HR FOR CASE   CHOLECYSTECTOMY N/A 07/06/2013   Procedure: LAPAROSCOPIC CHOLECYSTECTOMY WITH INTRAOPERATIVE CHOLANGIOGRAM;  Surgeon: Lodema Pilot, DO;  Location: [facility];  Service: General;  Laterality: N/A;   COLON RESECTION N/A 07/08/2013   Procedure: EXPLORATORY LAPAROSCOPY, DIAGNOSTIC LAPAROSCOPY, PARTIAL COLECTOMY, ABDOMINAL WASHOUT, ABDOMINAL WOUND VAC;  Surgeon: Lodema Pilot, DO;  Location: [facility];  Service: General;  Laterality: N/A;   CYSTOSCOPY WITH LITHOLAPAXY N/A 05/09/2022   Procedure: CYSTOSCOPY WITH LITHOLAPAXY, BOTOX INJECTION;  Surgeon: Crista Elliot, MD;  Location: [facility];  Service: Urology;  Laterality: N/A;   HIP SURGERY Bilateral AS CHILD   TENDON RELEASE   HOLMIUM LASER APPLICATION N/A 07/01/2020   Procedure: HOLMIUM LASER OF BLADDER STONE;  Surgeon: Rene Paci, MD;  Location: [facility];  Service: Urology;  Laterality: N/A;   INCISION AND DRAINAGE OF WOUND N/A 11/04/2013  Procedure: IRRIGATION AND DEBRIDEMENT ABDOMINAL WOUND WITH PLACEMENT OF ACELL/VAC;  Surgeon: Wayland Denis, DO;  Location: [facility];  Service: Plastics;  Laterality: N/A;   INSERTION OF SUPRAPUBIC CATHETER N/A 12/23/2019   Procedure: FLEXIBLE CYSTOSCOPE WITH FOLEY PLACEMENT OVER WIRE.;  Surgeon: Noel Christmas, MD;  Location: [facility];  Service: Urology;  Laterality: N/A;   INSERTION OF SUPRAPUBIC CATHETER N/A 07/26/2021   Procedure: EXCHANGE SUPRAPUBIC CATHETER AMD BOTOX INJECTION 300 UNITS;  Surgeon: Crista Elliot, MD;  [facility];  Service: Urology;  Laterality: N/A;   INSERTION OF SUPRAPUBIC CATHETER N/A 06/02/2023   Procedure: SUPRAPUBIC CATHETER CHANGE;  Surgeon: Crista Elliot, MD;  [facility];  Service: Urology;  Laterality: N/A;   IR CATHETER TUBE CHANGE  02/15/2019   IR CATHETER TUBE CHANGE  03/01/2019   IR CATHETER TUBE CHANGE  05/06/2019   IR CATHETER TUBE CHANGE  03/06/2020   IR CATHETER TUBE CHANGE  04/16/2020   IR  CATHETER TUBE CHANGE  04/29/2020   ORCHIOPEXY Bilateral AS CHILD   UNDESCENDED TESTIS   RIGHT COLECTOMY/  APPENDECTOMY/  ILEOSTOMY  NOV 2014  [facility]   surgical complication with cholecystectomy   UMBILICAL HERNIA REPAIR  child   VENTRICULOPERITONEAL SHUNT  11-12-2018 per pt and mother---LAST REVISION ~ 06/2015   DUE TO ABD WOUND--- SHUNT DRAINS TO HEART (12/23/2015)     reports that he has never smoked. He has never been exposed to tobacco smoke. He has never used smokeless tobacco. He reports that he does not drink alcohol and does not use drugs.  Allergies  Allergen Reactions   Latex Hives   Succinylcholine Rash and Other (See Comments)    Pt is half [place] - prolonged paralysis due to compromised metabolism. Succinylcholine given on 07/07/15 at outside hospital and pt experienced paralysis > 3 hours.   Due to being native [place], his mother states that he takes longer to "come out of it  Pt is half [place] - prolonged paralysis due to compromised metabolism. Succinylcholine given on 07/07/15 at outside hospital and pt experienced paralysis > 3 hours.   Ciprofloxacin Other (See Comments)    IV only-- caused burning in arm    Vancomycin Itching and Swelling   Adhesive [Tape] Hives and Rash   Wound Dressing Adhesive Hives and Rash    Family History  Problem Relation Age of Onset   Diabetes Other    Hypertension Other    Cancer Other    Stroke Other     Prior to Admission medications   Medication Sig Start Date End Date Taking? Authorizing Provider  acetaminophen (TYLENOL) 650 MG CR tablet Take 1,300 mg by mouth every 8 (eight) hours as needed for pain.    [provider]  Ascorbic Acid (VITAMIN C) 1000 MG tablet Take 1,000 mg by mouth daily with lunch.    [provider]  calcium elemental as carbonate (BARIATRIC TUMS ULTRA) 400 MG chewable tablet Chew 1,000 mg by mouth daily.    [provider]  Cranberry-Vitamin C-Vitamin E 4200-20-3  MG-MG-UNIT CAPS Take 1 capsule by mouth in the morning, at noon, and at bedtime.    [provider]  KLOR-CON M20 20 MEQ tablet Take 20 mEq by mouth See admin instructions. Take 20 mEq by mouth twice weekly on Monday and Thursday 03/14/23   [provider]  levETIRAcetam (KEPPRA) 500 MG tablet Take 500 mg by mouth 2 (two) times daily.  [provider]  liver oil-zinc oxide (DESITIN) 40 % ointment Apply 1 Application topically as needed for irritation.    [provider]  metoprolol tartrate (LOPRESSOR) 50 MG tablet Take 50 mg by mouth 2 (two) times daily.    [provider]  Multiple Minerals-Vitamins (CALCIUM-MAGNESIUM-ZINC-D3) TABS Take 1 tablet by mouth in the morning and at bedtime.    [provider]  Multiple Vitamin (MULTIVITAMIN WITH MINERALS) TABS tablet Take 1 tablet by mouth daily in the afternoon.    [provider]  nystatin cream (MYCOSTATIN) Apply 1 Application topically 2 (two) times daily. 05/01/23   [provider]  ondansetron (ZOFRAN) 4 MG tablet Take 4 mg by mouth every 8 (eight) hours as needed for nausea or vomiting. 03/14/23   [provider]  oxybutynin (DITROPAN) 5 MG tablet Take 5 mg by mouth 2 (two) times daily. 10/03/19   [provider]  polyethylene glycol (MIRALAX / GLYCOLAX) 17 g packet Take 17 g by mouth daily. 08/17/23   [name], MD  rosuvastatin (CRESTOR) 20 MG tablet Take 20 mg by mouth in the morning. 09/10/18   [provider]  verapamil (CALAN-SR) 120 MG CR tablet Take 120 mg by mouth at bedtime. 09/26/19   [provider]     Physical Exam: Vitals:   10/26/23 1726 10/26/23 1728 10/26/23 2227 10/27/23 0019  BP:  107/64 (!) 98/51 102/63  Pulse:  92 84 89  Resp:  17 16 16   Temp:  97.8 F (36.6 C) 97.9 F (36.6 C) 98 F (36.7 C)  TempSrc:  Oral Oral   SpO2:  99% 96% 100%  Weight: 74.4 kg     Height: 5\' 2"  (1.575 m)       Physical  Exam Constitutional:      Appearance: He is obese. He is not ill-appearing.  Cardiovascular:     Rate and Rhythm: Normal rate and regular rhythm.     Heart sounds: Normal heart sounds.  Pulmonary:     Effort: Pulmonary effort is normal.  Abdominal:     General: Bowel sounds are normal.     Palpations: Abdomen is soft.     Tenderness: There is no abdominal tenderness. There is no guarding or rebound.  Skin:    Capillary Refill: Capillary refill takes less than 2 seconds.  Neurological:     Mental Status: He is alert and oriented to person, place, and time.  Psychiatric:        Mood and Affect: Mood normal.      Labs on Admission: I have personally reviewed following labs and imaging studies  CBC: Recent Labs  Lab 10/26/23 1734  WBC 16.1*  HGB 15.6  HCT 46.4  MCV 79.0*  PLT 301   Basic Metabolic Panel: Recent Labs  Lab 10/26/23 1734  NA 126*  K 4.9  CL 90*  CO2 22  GLUCOSE 114*  BUN 28*  CREATININE 0.63  CALCIUM 8.6*   GFR: Estimated Creatinine Clearance: 108.5 mL/min (by C-G formula based on SCr of 0.63 mg/dL). Liver Function Tests: Recent Labs  Lab 10/26/23 1734  AST 19  ALT 32  ALKPHOS 57  BILITOT 0.9  PROT 6.7  ALBUMIN 3.1*   Recent Labs  Lab 10/26/23 1734  LIPASE 18   No results for input(s): "AMMONIA" in the last 168 hours. Coagulation Profile: No results for input(s): "INR", "PROTIME" in the last 168 hours. Cardiac Enzymes: No results for input(s): "CKTOTAL", "CKMB", "CKMBINDEX", "TROPONINI", "TROPONINIHS"  in the last 168 hours. BNP (last 3 results) No results for input(s): "BNP" in the last 8760 hours. HbA1C: No results for input(s): "HGBA1C" in the last 72 hours. CBG: No results for input(s): "GLUCAP" in the last 168 hours. Lipid Profile: No results for input(s): "CHOL", "HDL", "LDLCALC", "TRIG", "CHOLHDL", "LDLDIRECT" in the last 72 hours. Thyroid Function Tests: No results for input(s): "TSH", "T4TOTAL", "FREET4", "T3FREE",  "THYROIDAB" in the last 72 hours. Anemia Panel: No results for input(s): "VITAMINB12", "FOLATE", "FERRITIN", "TIBC", "IRON", "RETICCTPCT" in the last 72 hours. Urine analysis:    Component Value Date/Time   COLORURINE YELLOW 10/27/2023 0024   APPEARANCEUR CLOUDY (A) 10/27/2023 0024   LABSPEC 1.035 (H) 10/27/2023 0024   PHURINE 7.0 10/27/2023 0024   GLUCOSEU NEGATIVE 10/27/2023 0024   HGBUR MODERATE (A) 10/27/2023 0024   BILIRUBINUR NEGATIVE 10/27/2023 0024   KETONESUR 5 (A) 10/27/2023 0024   PROTEINUR 30 (A) 10/27/2023 0024   UROBILINOGEN 0.2 07/23/2015 1036   NITRITE NEGATIVE 10/27/2023 0024   LEUKOCYTESUR LARGE (A) 10/27/2023 0024    Radiological Exams on Admission: I have personally reviewed images CT ABDOMEN PELVIS W CONTRAST Result Date: 10/26/2023 CLINICAL DATA:  Three days of abdominal distention, nausea, abdominal pain. Cloudy urine. Rule out partial obstruction. EXAM: CT ABDOMEN AND PELVIS WITH CONTRAST TECHNIQUE: Multidetector CT imaging of the abdomen and pelvis was performed using the standard protocol following bolus administration of intravenous contrast. RADIATION DOSE REDUCTION: This exam was performed according to the departmental dose-optimization program which includes automated exposure control, adjustment of the mA and/or kV according to patient size and/or use of iterative reconstruction technique. CONTRAST:  75mL OMNIPAQUE IOHEXOL 350 MG/ML SOLN COMPARISON:  CT 08/15/2023 FINDINGS: Lower chest: No acute abnormality. Hepatobiliary: Cholecystectomy. No acute abnormality. Unremarkable liver. Pancreas: Unremarkable. Spleen: Unremarkable. Adrenals/Urinary Tract: Stable adrenal glands. No urinary calculi or hydronephrosis. Foley catheter in the nondistended bladder. Bladder wall thickening and mucosal hyperenhancement with perivesical stranding. Locules of gas in the bladder. Stomach/Bowel: Similar large broad-based chronic ventral hernia containing portions of the distal  stomach, multiple loops of small bowel. Stomach is within normal limits. The small bowel is dilated measuring up to 5.0 cm in diameter. Possible transition point at the ostomy (series 7/image 29). Fluid-filled small bowel with mural thickening and hyperenhancement of the wall and valvulae conniventes. Right colectomy. 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 Vascular/Lymphatic: No significant vascular findings are present. No enlarged abdominal or pelvic lymph nodes. Reproductive: No acute abnormality. Other: No free intraperitoneal fluid or air. VP shunt tubing terminating in the subcutaneous fat of the right lower quadrant. Musculoskeletal: Stable postsurgical changes from thoracolumbar spinal fixation and underlying spinal dysraphism. IMPRESSION: 1. There is wall thickening and mucosal hyperenhancement of the small bowel compatible with enteritis. 2. Dilated small bowel extending to the right lower quadrant ostomy. Possible transition point at the ostomy. Findings are indeterminate for ileus from enteritis or partial obstruction at the ostomy. 3. Bladder wall thickening and mucosal hyperenhancement with perivesical stranding. Correlate with urinalysis to exclude cystitis. 4. Similar large broad-based chronic ventral hernia containing portions of the distal stomach, multiple loops of small bowel. Electronically Signed   By: Minerva Fester M.D.   On: 10/26/2023 22:13    Assessment/Plan: Principal Problem:   Hyponatremia Active Problems:   Enteritis   Ileus due to infection [facility])   Partial obstruction of colon (HCC)   S/P VP shunt   Spina bifida (HCC)   Essential hypertension, benign  H/O ileostomy   Nocturnal hypoxia   Hypochloremia    Assessment and Plan: Hyponatremia Hypochloremia History recurrent hyponatremia -Patient presenting with complaining of abdominal distention and pain.  Further workup revealed development of ileus versus early development of  partial small bowel obstruction. -Patient does not have any vomiting and diarrhea.  On the contrary he has poor stool output through the ostomy site. -Patient reported drinking a lot of water as he is having high volume of diuresis. -CMP showed sodium 124 and low chloride 90. -Patient had another episodes of hyponatremia 2 months ago when sodium was found around 122 and he was discharged home when sodium improved 134. -Continue to check BMP every 6 hours. - Starting NS 75 cc/h.  Based on the serum sodium level can adjust the date of IV fluid. - Admitting patient to progressive unit.  Continue cardiac monitoring   Ileus from enteritis versus partial obstruction of the ostomy -Patient presenting with complaining of abdominal distention and poor stool output through the ileostomy site. - Patient is hemodynamically stable except borderline hypotensive and having mild leukocytosis. - CT abdomen pelvis showed enteritis, development of ileus from enteritis or partial obstruction at the ostomy site.  Chronic ventral hernia containing distal stomach and multiple loops of the small bowel. - As CT scan showing development of ileus versus partial obstruction ED physician spoke with general surgery Dr. Cliffton Asters who recommended admit patient for observation for good bowel movement and general surgery will evaluate patient in the morning around. -Starting clear liquid diet - Starting maintenance fluid NS 75 cc/h (also for the management of hyponatremia). -Also treating patient empirically for enteritis as mentioned below. -Continue MiraLAX once daily and docusate twice daily for improvement of bowel movement. -If patient develops significant abdominal pain, any acute abdominal sign in that case need to obtain stat CT abdomen. -Starting clear liquid diet.  Once patient will have good stool output through the ostomy site and ileus versus small bowel obstruction will be resolved in that case we will transition to  regular diet.   Enteritis -Patient is hypotensive on initial presentation.  CBC showing evidence of leukocytosis. -Patient's mother at the bedside denies any nausea, vomiting and loose stool by the ostomy site on the contrary patient has poor output through the ostomy. -Obtaining blood cultures, and obtaining GI panel.  Given patient does not have any recent exposure to antibiotic low suspicion of C. difficile - Continue IV ceftriaxone and metronidazole. - Will follow-up with culture result and GI panel for further decision to continue antibiotic versus discontinuing.  History of spina bifida with paraplegia History of neurogenic bladder status post suprapubic catheter -Continue supra-pubic catheter management.  Nocturnal hypoxia -Continue to check pulse ox and supplemental oxygen at the bedtime to keep O2 sat 92%.  History of seizure -Continue Keppra 500 mg twice daily.  History of obstructive hydrocephalus s/p  VP shunt placement -Patient denies any headache.  Continue to monitor clinically.  Essential hypertension - Given patient blood pressure is borderline soft holding verapamil.   -Continue Lopressor 50 mg twice daily.  Hyperlipidemia - Continue Crestor   DVT prophylaxis:  SQ Heparin Code Status:  Full Code Diet: Starting clear liquid diet. Family Communication:   Family was present at bedside, at the time of interview. Opportunity was given to ask question and all questions were answered satisfactorily.  Disposition Plan: Continue to monitor improvement of stool output through the ostomy.  Monitor serum sodium level improvement.  Need to follow-up with blood culture  and stool study results. Consults: General Surgery Admission status:   Inpatient, Step Down Unit  Severity of Illness: The appropriate patient status for this patient is INPATIENT. Inpatient status is judged to be reasonable and necessary in order to provide the required intensity of service to ensure the  patient's safety. The patient's presenting symptoms, physical exam findings, and initial radiographic and laboratory data in the context of their chronic comorbidities is felt to place them at high risk for further clinical deterioration. Furthermore, it is not anticipated that the patient will be medically stable for discharge from the hospital within 2 midnights of admission.   * I certify that at the point of admission it is my clinical judgment that the patient will require inpatient hospital care spanning beyond 2 midnights from the point of admission due to high intensity of service, high risk for further deterioration and high frequency of surveillance required.Marland Kitchen    Tereasa Coop, MD [facility]  How to contact the [facility] Attending or Consulting provider 7A - 7P or covering provider during after hours 7P -7A, for this patient.  Check the care team in [facility] and look for a) attending/consulting [facility] provider listed and b) the [facility] team listed Log into [email or web address] and use [employer]'s universal password to access. If you do not have the password, please contact the hospital operator. Locate the [facility] provider you are looking for under [facility] and page to a number that you can be directly reached. If you still have difficulty reaching the provider, please page the [facility] (Director on Call) for the Hospitalists listed on amion for assistance.  10/27/2023, 1:27 AM

## 2023-10-27 NOTE — Consult Note (Signed)
CC/Reason for consult: Small bowel obstruction vs ileostomy stensosi  HPI: [name] is an 43 y.o. male with medical history significant for congenital paraplegia, spina bifida, hypertension, seizures, neurogenic bowel, status post VP shunt, complex abdominal surgery history as below now with ileostomy who presented to [name] ED with decreased appetite, abdominal distention, decreased ileostomy output initially and dehydration.  He reports his mom usually inserts a dilator and a stoma however this is usually uncomfortable for him and when he saw her going to retrieve the dilator, he quickly inserted his finger to the ostomy and is able to dilated himself.  Since that time, he has had significant amounts of output.  This occurred after his CT scan.  He denies any abdominal pain, nausea, vomiting, or distention at present.  He reports feeling much better since having done this. This occurred similarly 08/2023 and was seen at [name] for similar.  He also tried senna and gas medication at home.  He denies vomiting, fever.   Prior abdominal surgical history consists of a laparoscopic cholecystectomy at [name] in 2014 followed by hepatic flexure colonic perforation for which he received a right hemicolectomy and was left in discontinuity.  He was transferred to [name] and had multiple abdominal surgeries ultimately resulting in bowel resection (95 cm of ileum and jejunum resected) creation of ostomy and Vicryl mesh placement.  He was left with 140 cm of small bowel.  He had a [facility] shunt placed.  Past Medical History:  Diagnosis Date   Arnold-Chiari malformation (HCC)    Congenital paraplegia (HCC)    MID-ABDOMIN DOWN  S/P SPINA BIFIDA   GERD (gastroesophageal reflux disease)    Heart palpitations    SECONDARY TO VP SHUNT DRAIN   History of kidney stones    History of seizure    per pt mother, no seizures since age 43   Hypertension    Ileostomy in place [facility]) 2014   Migraines  followed by neurologist @[facility]    taking verapamil   Neurogenic bladder    INCONTINENT -   Neurogenic bowel    S/P VP shunt    Spina bifida with hydrocephalus, lumbar region (HCC)    CONGENITAL--   VP SHUNT in place--  PARALYZIED MID ABDOMEN DOWN   Urinary incontinence with continuous leakage    Wears glasses    Wheelchair bound    CAN TRASFER SELF WITHOUT BOARD    Past Surgical History:  Procedure Laterality Date   BACK SURGERY  infant   "get skin to stretch over his back; spinal bifida"   BACK SURGERY  age 20   scoliosis and  removed hump on his back (kyphosis) and put rods in to stabalize back"   BOTOX INJECTION N/A 11/14/2018   Procedure: BOTOX INJECTION CYSTOSCOPY;  Surgeon: Crista Elliot, MD;  Location: [facility];  Service: Urology;  Laterality: N/A;   BOTOX INJECTION N/A 05/22/2019   Procedure: BOTOX INJECTION;  Surgeon: Crista Elliot, MD;  Location: [facility];  Service: Urology;  Laterality: N/A;   BOTOX INJECTION N/A 11/13/2019   Procedure: CYSTOSCOPY BOTOX INJECTION;  Surgeon: Crista Elliot, MD;  [facility]: [facility];  Service: Urology;  Laterality: N/A;   BOTOX INJECTION N/A 07/01/2020   Procedure: CYSTOSCOPY, BOTOX INJECTION, BLADDER FULGERATION, SUPRAPUBIC TUBE EXCHANGE;  Surgeon: Rene Paci, MD;  Location: [facility];  Service: Urology;  Laterality: N/A;   BOTOX INJECTION N/A 12/14/2020   Procedure: BOTOX INJECTION 300 UNITS  SUPRAPUBIC TUBE CHANGE;  Surgeon: Crista Elliot, MD;  [facility];  Service: Urology;  Laterality: N/A;   BOTOX INJECTION N/A 06/02/2023   Procedure: CYSTOSCOPY BOTOX INJECTION 200 UNITS;  Surgeon: Crista Elliot, MD;  Location: [facility];  Service: Urology;  Laterality: N/A;  1 HR FOR CASE   CHOLECYSTECTOMY N/A 07/06/2013   Procedure: LAPAROSCOPIC CHOLECYSTECTOMY WITH INTRAOPERATIVE CHOLANGIOGRAM;  Surgeon: Lodema Pilot, DO;  Location: [facility];  Service: General;  Laterality: N/A;   COLON RESECTION N/A 07/08/2013   Procedure:  EXPLORATORY LAPAROSCOPY, DIAGNOSTIC LAPAROSCOPY, PARTIAL COLECTOMY, ABDOMINAL WASHOUT, ABDOMINAL WOUND VAC;  Surgeon: Lodema Pilot, DO;  Location: [facility];  Service: General;  Laterality: N/A;   CYSTOSCOPY WITH LITHOLAPAXY N/A 05/09/2022   Procedure: CYSTOSCOPY WITH LITHOLAPAXY, BOTOX INJECTION;  Surgeon: Crista Elliot, MD;  [facility];  Service: Urology;  Laterality: N/A;   HIP SURGERY Bilateral AS CHILD   TENDON RELEASE   HOLMIUM LASER APPLICATION N/A 07/01/2020   Procedure: HOLMIUM LASER OF BLADDER STONE;  Surgeon: Rene Paci, MD;  [facility];  Service: Urology;  Laterality: N/A;   INCISION AND DRAINAGE OF WOUND N/A 11/04/2013   Procedure: IRRIGATION AND DEBRIDEMENT ABDOMINAL WOUND WITH PLACEMENT OF ACELL/VAC;  Surgeon: Wayland Denis, DO;  Location: [facility];  Service: Plastics;  Laterality: N/A;   INSERTION OF SUPRAPUBIC CATHETER N/A 12/23/2019   Procedure: FLEXIBLE CYSTOSCOPE WITH FOLEY PLACEMENT OVER WIRE.;  Surgeon: Noel Christmas, MD;  [facility];  Service: Urology;  Laterality: N/A;   INSERTION OF SUPRAPUBIC CATHETER N/A 07/26/2021   Procedure: EXCHANGE SUPRAPUBIC CATHETER AMD BOTOX INJECTION 300 UNITS;  Surgeon: Crista Elliot, MD;  [facility];  Service: Urology;  Laterality: N/A;   INSERTION OF SUPRAPUBIC CATHETER N/A 06/02/2023   Procedure: SUPRAPUBIC CATHETER CHANGE;  Surgeon: Crista Elliot, MD;  [facility];  Service: Urology;  Laterality: N/A;   IR CATHETER TUBE CHANGE  02/15/2019   IR CATHETER TUBE CHANGE  03/01/2019   IR CATHETER TUBE CHANGE  05/06/2019   IR CATHETER TUBE CHANGE  03/06/2020   IR CATHETER TUBE CHANGE  04/16/2020   IR CATHETER TUBE CHANGE  04/29/2020   ORCHIOPEXY Bilateral AS CHILD   UNDESCENDED TESTIS   RIGHT COLECTOMY/  APPENDECTOMY/  ILEOSTOMY  NOV 2014  [facility]   surgical complication with cholecystectomy   UMBILICAL HERNIA REPAIR  child   VENTRICULOPERITONEAL SHUNT  11-12-2018 per pt and  mother---LAST REVISION ~ 06/2015   DUE TO ABD WOUND--- SHUNT DRAINS TO HEART (12/23/2015)    Family History  Problem Relation Age of Onset   Diabetes Other    Hypertension Other    Cancer Other    Stroke Other     Social:  reports that he has never smoked. He has never been exposed to tobacco smoke. He has never used smokeless tobacco. He reports that he does not drink alcohol and does not use drugs.  Allergies:  Allergies  Allergen Reactions   Latex Hives   Succinylcholine Rash and Other (See Comments)    Pt is half [place] - prolonged paralysis due to compromised metabolism. Succinylcholine given on 07/07/15 at outside hospital and pt experienced paralysis > 3 hours.   Due to being native [place], his mother states that he takes longer to "come out of it  Pt is half [place] - prolonged paralysis due to compromised metabolism. Succinylcholine given on 07/07/15 at outside hospital and pt  experienced paralysis > 3 hours.   Ciprofloxacin Other (See Comments)    IV only-- caused burning in arm    Vancomycin Itching and Swelling   Adhesive [Tape] Hives and Rash   Wound Dressing Adhesive Hives and Rash    Medications: I have reviewed the patient's current medications.  Results for orders placed or performed during the hospital encounter of 10/26/23 (from the past 48 hours)  Lipase, blood     Status: None   Collection Time: 10/26/23  5:34 PM  Result Value Ref Range   Lipase 18 11 - 51 U/L    Comment: Performed at [facility], [address], [place], [place] [address]  Comprehensive metabolic panel     Status: Abnormal   Collection Time: 10/26/23  5:34 PM  Result Value Ref Range   Sodium 126 (L) 135 - 145 mmol/L   Potassium 4.9 3.5 - 5.1 mmol/L   Chloride 90 (L) 98 - 111 mmol/L   CO2 22 22 - 32 mmol/L   Glucose, Bld 114 (H) 70 - 99 mg/dL    Comment: Glucose reference range applies only to samples taken after fasting for at least 8 hours.   BUN 28 (H) 6 -  20 mg/dL   Creatinine, Ser 4.01 0.61 - 1.24 mg/dL   Calcium 8.6 (L) 8.9 - 10.3 mg/dL   Total Protein 6.7 6.5 - 8.1 g/dL   Albumin 3.1 (L) 3.5 - 5.0 g/dL   AST 19 15 - 41 U/L   ALT 32 0 - 44 U/L   Alkaline Phosphatase 57 38 - 126 U/L   Total Bilirubin 0.9 0.0 - 1.2 mg/dL   GFR, Estimated >02 >72 mL/min    Comment: (NOTE) Calculated using the CKD-EPI Creatinine Equation (2021)    Anion gap 14 5 - 15    Comment: Performed at [facility], [address] [place], [place] [address]  CBC     Status: Abnormal   Collection Time: 10/26/23  5:34 PM  Result Value Ref Range   WBC 16.1 (H) 4.0 - 10.5 K/uL   RBC 5.87 (H) 4.22 - 5.81 MIL/uL   Hemoglobin 15.6 13.0 - 17.0 g/dL   HCT 40.3 47.4 - 25.9 %   MCV 79.0 (L) 80.0 - 100.0 fL   MCH 26.6 26.0 - 34.0 pg   MCHC 33.6 30.0 - 36.0 g/dL   RDW 56.3 87.5 - 64.3 %   Platelets 301 150 - 400 K/uL   nRBC 0.0 0.0 - 0.2 %    Comment: Performed at [facility], [address] [place], [place] [address]  I-Stat CG4 Lactic Acid     Status: None   Collection Time: 10/26/23  9:30 PM  Result Value Ref Range   Lactic Acid, Venous 1.1 0.5 - 1.9 mmol/L  Urinalysis, Routine w reflex microscopic -Urine, Clean Catch     Status: Abnormal   Collection Time: 10/27/23 12:24 AM  Result Value Ref Range   Color, Urine YELLOW YELLOW   APPearance CLOUDY (A) CLEAR   Specific Gravity, Urine 1.035 (H) 1.005 - 1.030   pH 7.0 5.0 - 8.0   Glucose, UA NEGATIVE NEGATIVE mg/dL   Hgb urine dipstick MODERATE (A) NEGATIVE   Bilirubin Urine NEGATIVE NEGATIVE   Ketones, ur 5 (A) NEGATIVE mg/dL   Protein, ur 30 (A) NEGATIVE mg/dL   Nitrite NEGATIVE NEGATIVE   Leukocytes,Ua LARGE (A) NEGATIVE   RBC / HPF 21-50 0 - 5 RBC/hpf  WBC, UA >50 0 - 5 WBC/hpf   Bacteria, UA RARE (A) NONE SEEN   Squamous Epithelial / HPF 0-5 0 - 5 /HPF   WBC Clumps PRESENT    Mucus PRESENT     Comment: Performed at [facility], [address] [place], [place] [address]   Osmolality     Status: None   Collection Time: 10/27/23  2:50 AM  Result Value Ref Range   Osmolality 280 275 - 295 mOsm/kg    Comment: Performed at [facility], [address] [place], [place] [address]  Basic metabolic panel     Status: Abnormal   Collection Time: 10/27/23  3:32 AM  Result Value Ref Range   Sodium 129 (L) 135 - 145 mmol/L   Potassium 5.1 3.5 - 5.1 mmol/L   Chloride 89 (L) 98 - 111 mmol/L   CO2 23 22 - 32 mmol/L   Glucose, Bld 90 70 - 99 mg/dL    Comment: Glucose reference range applies only to samples taken after fasting for at least 8 hours.   BUN 21 (H) 6 - 20 mg/dL   Creatinine, Ser 3.24 (L) 0.61 - 1.24 mg/dL   Calcium 8.8 (L) 8.9 - 10.3 mg/dL   GFR, Estimated >40 >10 mL/min    Comment: (NOTE) Calculated using the CKD-EPI Creatinine Equation (2021)    Anion gap 17 (H) 5 - 15    Comment: Performed at [facility], [address], [place], [place] [address]  CBC     Status: Abnormal   Collection Time: 10/27/23  3:32 AM  Result Value Ref Range   WBC 11.6 (H) 4.0 - 10.5 K/uL   RBC 5.75 4.22 - 5.81 MIL/uL   Hemoglobin 14.9 13.0 - 17.0 g/dL   HCT 66.4 40.3 - 47.4 %   MCV 79.5 (L) 80.0 - 100.0 fL   MCH 25.9 (L) 26.0 - 34.0 pg   MCHC 32.6 30.0 - 36.0 g/dL   RDW 25.9 56.3 - 87.5 %   Platelets 291 150 - 400 K/uL   nRBC 0.0 0.0 - 0.2 %    Comment: Performed at [facility], [address] [place], [place] [address]    CT ABDOMEN PELVIS W CONTRAST Result Date: 10/26/2023 CLINICAL DATA:  Three days of abdominal distention, nausea, abdominal pain. Cloudy urine. Rule out partial obstruction. EXAM: CT ABDOMEN AND PELVIS WITH CONTRAST TECHNIQUE: Multidetector CT imaging of the abdomen and pelvis was performed using the standard protocol following bolus administration of intravenous contrast. RADIATION DOSE REDUCTION: This exam was performed according to the departmental dose-optimization program which includes automated exposure control, adjustment  of the mA and/or kV according to patient size and/or use of iterative reconstruction technique. CONTRAST:  75mL OMNIPAQUE IOHEXOL 350 MG/ML SOLN COMPARISON:  CT 08/15/2023 FINDINGS: Lower chest: No acute abnormality. Hepatobiliary: Cholecystectomy. No acute abnormality. Unremarkable liver. Pancreas: Unremarkable. Spleen: Unremarkable. Adrenals/Urinary Tract: Stable adrenal glands. No urinary calculi or hydronephrosis. Foley catheter in the nondistended bladder. Bladder wall thickening and mucosal hyperenhancement with perivesical stranding. Locules of gas in the bladder. Stomach/Bowel: Similar large broad-based chronic ventral hernia containing portions of the distal stomach, multiple loops of small bowel. Stomach is within normal limits. The small bowel is dilated measuring up to 5.0 cm in diameter. Possible transition point at the ostomy (series 7/image 29). Fluid-filled small bowel with mural thickening and hyperenhancement of the wall and valvulae conniventes. Right colectomy. 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 Vascular/Lymphatic: No significant vascular findings are present. No enlarged abdominal or pelvic lymph nodes. Reproductive: No acute abnormality. Other: No free intraperitoneal fluid or air. VP shunt tubing terminating in the subcutaneous fat of the right lower quadrant. Musculoskeletal: Stable postsurgical changes from thoracolumbar spinal fixation and underlying spinal dysraphism. IMPRESSION: 1. There is wall thickening and mucosal hyperenhancement of the small bowel compatible with enteritis. 2. Dilated small bowel extending to the right lower quadrant ostomy. Possible transition point at the ostomy. Findings are indeterminate for ileus from enteritis or partial obstruction at the ostomy. 3. Bladder wall thickening and mucosal hyperenhancement with perivesical stranding. Correlate with urinalysis to exclude cystitis. 4. Similar large broad-based chronic ventral  hernia containing portions of the distal stomach, multiple loops of small bowel. Electronically Signed   By: Minerva Fester M.D.   On: 10/26/2023 22:13    ROS - all of the below systems have been reviewed with the patient and positives are indicated with bold text General: chills, fever or night sweats Eyes: blurry vision or double vision ENT: epistaxis or sore throat Allergy/Immunology: itchy/watery eyes or nasal congestion Hematologic/Lymphatic: bleeding problems, blood clots or swollen lymph nodes Endocrine: temperature intolerance or unexpected weight changes Breast: new or changing breast lumps or nipple discharge Resp: cough, shortness of breath, or wheezing CV: chest pain or dyspnea on exertion GI: as per HPI GU: dysuria, trouble voiding, or hematuria MSK: joint pain or joint stiffness Neuro: TIA or stroke symptoms Derm: pruritus and skin lesion changes Psych: anxiety and depression  PE Blood pressure 128/69, pulse (!) 107, temperature 97.7 F (36.5 C), temperature source Oral, resp. rate 16, height 5\' 2"  (1.575 m), weight 74.4 kg, SpO2 100%. Constitutional: NAD; conversant Eyes: Moist conjunctiva Lungs: Normal respiratory effort CV: RRR GI: Abd obese soft, nontender, nondistended; ostomy with now appliance in place full of yellow stool, hooked to suction hose type device where it drains into a patient belonging bag due to how much has been coming out Psychiatric: Appropriate affect  Results for orders placed or performed during the hospital encounter of 10/26/23 (from the past 48 hours)  Lipase, blood     Status: None   Collection Time: 10/26/23  5:34 PM  Result Value Ref Range   Lipase 18 11 - 51 U/L    Comment: Performed at [facility], [address] [place], [place] [address]  Comprehensive metabolic panel     Status: Abnormal   Collection Time: 10/26/23  5:34 PM  Result Value Ref Range   Sodium 126 (L) 135 - 145 mmol/L   Potassium 4.9 3.5 - 5.1 mmol/L    Chloride 90 (L) 98 - 111 mmol/L   CO2 22 22 - 32 mmol/L   Glucose, Bld 114 (H) 70 - 99 mg/dL    Comment: Glucose reference range applies only to samples taken after fasting for at least 8 hours.   BUN 28 (H) 6 - 20 mg/dL   Creatinine, Ser 5.62 0.61 - 1.24 mg/dL   Calcium 8.6 (L) 8.9 - 10.3 mg/dL   Total Protein 6.7 6.5 - 8.1 g/dL   Albumin 3.1 (L) 3.5 - 5.0 g/dL   AST 19 15 - 41 U/L   ALT 32 0 - 44 U/L   Alkaline Phosphatase 57 38 - 126 U/L   Total Bilirubin 0.9 0.0 - 1.2 mg/dL   GFR, Estimated >13 >08 mL/min    Comment: (NOTE) Calculated using the CKD-EPI Creatinine Equation (2021)  Anion gap 14 5 - 15    Comment: Performed at [facility], [address], [place], [place] [address]  CBC     Status: Abnormal   Collection Time: 10/26/23  5:34 PM  Result Value Ref Range   WBC 16.1 (H) 4.0 - 10.5 K/uL   RBC 5.87 (H) 4.22 - 5.81 MIL/uL   Hemoglobin 15.6 13.0 - 17.0 g/dL   HCT 60.4 54.0 - 98.1 %   MCV 79.0 (L) 80.0 - 100.0 fL   MCH 26.6 26.0 - 34.0 pg   MCHC 33.6 30.0 - 36.0 g/dL   RDW 19.1 47.8 - 29.5 %   Platelets 301 150 - 400 K/uL   nRBC 0.0 0.0 - 0.2 %    Comment: Performed at [facility], [address], [place], [place] [address]  I-Stat CG4 Lactic Acid     Status: None   Collection Time: 10/26/23  9:30 PM  Result Value Ref Range   Lactic Acid, Venous 1.1 0.5 - 1.9 mmol/L  Urinalysis, Routine w reflex microscopic -Urine, Clean Catch     Status: Abnormal   Collection Time: 10/27/23 12:24 AM  Result Value Ref Range   Color, Urine YELLOW YELLOW   APPearance CLOUDY (A) CLEAR   Specific Gravity, Urine 1.035 (H) 1.005 - 1.030   pH 7.0 5.0 - 8.0   Glucose, UA NEGATIVE NEGATIVE mg/dL   Hgb urine dipstick MODERATE (A) NEGATIVE   Bilirubin Urine NEGATIVE NEGATIVE   Ketones, ur 5 (A) NEGATIVE mg/dL   Protein, ur 30 (A) NEGATIVE mg/dL   Nitrite NEGATIVE NEGATIVE   Leukocytes,Ua LARGE (A) NEGATIVE   RBC / HPF 21-50 0 - 5 RBC/hpf   WBC, UA >50 0 - 5 WBC/hpf    Bacteria, UA RARE (A) NONE SEEN   Squamous Epithelial / HPF 0-5 0 - 5 /HPF   WBC Clumps PRESENT    Mucus PRESENT     Comment: Performed at [facility], [address], [place], [place] [address]  Osmolality     Status: None   Collection Time: 10/27/23  2:50 AM  Result Value Ref Range   Osmolality 280 275 - 295 mOsm/kg    Comment: Performed at [facility], [address], [place], [place] [address]  Basic metabolic panel     Status: Abnormal   Collection Time: 10/27/23  3:32 AM  Result Value Ref Range   Sodium 129 (L) 135 - 145 mmol/L   Potassium 5.1 3.5 - 5.1 mmol/L   Chloride 89 (L) 98 - 111 mmol/L   CO2 23 22 - 32 mmol/L   Glucose, Bld 90 70 - 99 mg/dL    Comment: Glucose reference range applies only to samples taken after fasting for at least 8 hours.   BUN 21 (H) 6 - 20 mg/dL   Creatinine, Ser 9.52 (L) 0.61 - 1.24 mg/dL   Calcium 8.8 (L) 8.9 - 10.3 mg/dL   GFR, Estimated >84 >13 mL/min    Comment: (NOTE) Calculated using the CKD-EPI Creatinine Equation (2021)    Anion gap 17 (H) 5 - 15    Comment: Performed at [facility], [address], [place], [place] [address]  CBC     Status: Abnormal   Collection Time: 10/27/23  3:32 AM  Result Value Ref Range   WBC 11.6 (H) 4.0 - 10.5 K/uL   RBC 5.75 4.22 - 5.81 MIL/uL   Hemoglobin 14.9 13.0 - 17.0 g/dL  HCT 45.7 39.0 - 52.0 %   MCV 79.5 (L) 80.0 - 100.0 fL   MCH 25.9 (L) 26.0 - 34.0 pg   MCHC 32.6 30.0 - 36.0 g/dL   RDW 45.4 09.8 - 11.9 %   Platelets 291 150 - 400 K/uL   nRBC 0.0 0.0 - 0.2 %    Comment: Performed at [facility], [address], [place], [place] [address]    CT ABDOMEN PELVIS W CONTRAST Result Date: 10/26/2023 CLINICAL DATA:  Three days of abdominal distention, nausea, abdominal pain. Cloudy urine. Rule out partial obstruction. EXAM: CT ABDOMEN AND PELVIS WITH CONTRAST TECHNIQUE: Multidetector CT imaging of the abdomen and pelvis was performed using the standard protocol following  bolus administration of intravenous contrast. RADIATION DOSE REDUCTION: This exam was performed according to the departmental dose-optimization program which includes automated exposure control, adjustment of the mA and/or kV according to patient size and/or use of iterative reconstruction technique. CONTRAST:  75mL OMNIPAQUE IOHEXOL 350 MG/ML SOLN COMPARISON:  CT 08/15/2023 FINDINGS: Lower chest: No acute abnormality. Hepatobiliary: Cholecystectomy. No acute abnormality. Unremarkable liver. Pancreas: Unremarkable. Spleen: Unremarkable. Adrenals/Urinary Tract: Stable adrenal glands. No urinary calculi or hydronephrosis. Foley catheter in the nondistended bladder. Bladder wall thickening and mucosal hyperenhancement with perivesical stranding. Locules of gas in the bladder. Stomach/Bowel: Similar large broad-based chronic ventral hernia containing portions of the distal stomach, multiple loops of small bowel. Stomach is within normal limits. The small bowel is dilated measuring up to 5.0 cm in diameter. Possible transition point at the ostomy (series 7/image 29). Fluid-filled small bowel with mural thickening and hyperenhancement of the wall and valvulae conniventes. Right colectomy. 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 Vascular/Lymphatic: No significant vascular findings are present. No enlarged abdominal or pelvic lymph nodes. Reproductive: No acute abnormality. Other: No free intraperitoneal fluid or air. VP shunt tubing terminating in the subcutaneous fat of the right lower quadrant. Musculoskeletal: Stable postsurgical changes from thoracolumbar spinal fixation and underlying spinal dysraphism. IMPRESSION: 1. There is wall thickening and mucosal hyperenhancement of the small bowel compatible with enteritis. 2. Dilated small bowel extending to the right lower quadrant ostomy. Possible transition point at the ostomy. Findings are indeterminate for ileus from enteritis or  partial obstruction at the ostomy. 3. Bladder wall thickening and mucosal hyperenhancement with perivesical stranding. Correlate with urinalysis to exclude cystitis. 4. Similar large broad-based chronic ventral hernia containing portions of the distal stomach, multiple loops of small bowel. Electronically Signed   By: Minerva Fester M.D.   On: 10/26/2023 22:13     A/P: [name] is an 43 y.o. male with congenital paraplegia, spina bifida, hypertension, seizures, neurogenic bowel, status post VP shunt, complex abdominal surgery   -Ileostomy has begun to put out copious amounts of stool now.  It appears that when this was digitized by his account, things have opened up dramatically.  He has no distention, pain, or tenderness on exam. - He is being admitted for volume resuscitation/dehydration and what appears to be chronic hyponatremia. - He does not appear obstructed any longer. Digitize stoma has been successful for him. Revision may be worthwhile considering for longterm plan here to reduce this from happening again. He reports he has been following in past with [facility] where this was created  Diet as per primary.  We will remain available.  Please call us if questions or concerns arise.  I spent a total of 65 minutes in both face-to-face and  non-face-to-face activities, excluding procedures performed, for this visit on the date of this encounter.   Marin Olp, MD [facility], A [facility]

## 2023-10-28 ENCOUNTER — Other Ambulatory Visit (HOSPITAL_COMMUNITY): Payer: Self-pay

## 2023-10-28 DIAGNOSIS — Z982 Presence of cerebrospinal fluid drainage device: Secondary | ICD-10-CM

## 2023-10-28 DIAGNOSIS — E871 Hypo-osmolality and hyponatremia: Secondary | ICD-10-CM | POA: Diagnosis not present

## 2023-10-28 DIAGNOSIS — K56600 Partial intestinal obstruction, unspecified as to cause: Secondary | ICD-10-CM | POA: Diagnosis not present

## 2023-10-28 DIAGNOSIS — K529 Noninfective gastroenteritis and colitis, unspecified: Secondary | ICD-10-CM | POA: Diagnosis not present

## 2023-10-28 LAB — BASIC METABOLIC PANEL
Anion gap: 8 (ref 5–15)
BUN: 8 mg/dL (ref 6–20)
CO2: 24 mmol/L (ref 22–32)
Calcium: 8 mg/dL — ABNORMAL LOW (ref 8.9–10.3)
Chloride: 101 mmol/L (ref 98–111)
Creatinine, Ser: 0.48 mg/dL — ABNORMAL LOW (ref 0.61–1.24)
GFR, Estimated: >60 mL/min (ref >60)
Glucose, Bld: 88 mg/dL (ref 70–99)
Potassium: 4.6 mmol/L (ref 3.5–5.1)
Sodium: 133 mmol/L — ABNORMAL LOW (ref 135–145)

## 2023-10-28 MED ORDER — AMOXICILLIN-POT CLAVULANATE 875-125 MG PO TABS
1.0000 | ORAL_TABLET | Freq: Two times a day (BID) | ORAL | 0 refills | Status: AC
Start: 2023-10-28 — End: 2023-10-31
  Filled 2023-10-28: qty 6, 3d supply, fill #0

## 2023-10-28 NOTE — Discharge Summary (Signed)
PATIENT DETAILS Name: [name] Age: 43 y.o. Sex: male Date of Birth: 1981-04-30 MRN: [id number]. Admitting Physician: Tereasa Coop, MD [id number], Alcario Drought, MD  Admit Date: 10/26/2023 Discharge date: 10/28/2023  Recommendations for Outpatient Follow-up:  Follow up with PCP in 1-2 weeks Please obtain CMP/CBC in one week Verapamil on hold-resume when able.  Blood pressure currently controlled with just metoprolol.  Admitted From:  Home  Disposition: Home health   Discharge Condition: good  CODE STATUS:   Code Status: Full Code   Diet recommendation:  Diet Order             Diet - low sodium heart healthy           DIET SOFT Room service appropriate? 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  Significant studies 2/13>> CT abdomen/pelvis: Wall thickening/mucosal enhancement of small bowel-compatible with enteritis. 2/14>> blood culture: No growth 2/14>> GI pathogen panel: Negative.  Brief Hospital Course: Ileus Resolved after digital manipulation Likely due to enteritis Managed with supportive care-empiric antibiotics Discussed with patient/mother at baseline-stool output is back to normal baseline (per mother-usually they change bag twice a day-which was the case yesterday)-they feel comfortable going home Will switch Rocephin/Flagyl to Augmentin for a few more days.  Hyponatremia Improved with IV fluids Sodium level stable at 133.  Spina bifida/paraplegia/neurogenic bladder-s/p suprapubic catheter placement Obstructive hydrocephalus-s/p VP shunt placement At baseline  Seizure disorder Continue Keppra  HTN BP stable Continue beta-blocker Continue  to hold verapamil-follow-up with PCP and resume accordingly  HLD Continue Crestor  Class I obesity: Estimated body mass index is 30 kg/m as calculated from the following:   Height as of this encounter: 5\' 2"  (1.575 m).   Weight as of this encounter: 74.4 kg.   Discharge Diagnoses:  Principal Problem:   Hyponatremia Active Problems:   Enteritis   Ileus due to infection (HCC)   Partial obstruction of colon (HCC)   S/P VP shunt   Spina bifida (HCC)   Essential hypertension, benign   H/O ileostomy   Nocturnal hypoxia   Hypochloremia   Ileus (HCC)   Discharge Instructions:  Activity:  As tolerated with Full fall precautions use walker/cane & assistance as needed  Discharge Instructions     Call MD for:  difficulty breathing, headache or visual disturbances   Complete by: As directed    Call MD for:  extreme fatigue   Complete by: As directed    Call MD for:  persistant dizziness or light-headedness   Complete by: As directed    Diet - low sodium heart healthy   Complete by: As directed    Discharge instructions   Complete by: As directed    Follow with Primary MD  Paliwal, Himanshu, MD in 1-2 weeks  Please get a complete blood count and chemistry panel checked by your Primary MD at your next visit, and again as instructed by your Primary MD.  Get Medicines reviewed and adjusted: Please take all your medications with you for your next visit with your Primary MD  Laboratory/radiological data: Please request your Primary MD to go over all hospital tests and procedure/radiological results at the follow up, please ask your Primary MD to get all  Hospital records sent to his/her office.  In some cases, they will be blood work, cultures and biopsy results pending at the time of your discharge. Please request that your primary care M.D. follows up on these results.  Also Note the following: If you experience worsening of your admission symptoms, develop shortness of breath,  life threatening emergency, suicidal or homicidal thoughts you must seek medical attention immediately by calling 911 or calling your MD immediately  if symptoms less severe.  You must read complete instructions/literature along with all the possible adverse reactions/side effects for all the Medicines you take and that have been prescribed to you. Take any new Medicines after you have completely understood and accpet all the possible adverse reactions/side effects.   Do not drive when taking Pain medications or sleeping medications (Benzodaizepines)  Do not take more than prescribed Pain, Sleep and Anxiety Medications. It is not advisable to combine anxiety,sleep and pain medications without talking with your primary care practitioner  Special Instructions: If you have smoked or chewed Tobacco  in the last 2 yrs please stop smoking, stop any regular Alcohol  and or any Recreational drug use.  Wear Seat belts while driving.  Please note: You were cared for by a hospitalist during your hospital stay. Once you are discharged, your primary care physician will handle any further medical issues. Please note that NO REFILLS for any discharge medications will be authorized once you are discharged, as it is imperative that you return to your primary care physician (or establish a relationship with a primary care physician if you do not have one) for your post hospital discharge needs so that they can reassess your need for medications and monitor your lab values.   Continue to hold verapamil-resume after speaking with your primary care practitioner.  Her blood pressure is currently controlled with just metoprolol.   Increase activity slowly   Complete by: As directed       Allergies as of 10/28/2023       Reactions   Latex Hives   Succinylcholine Rash, Other (See Comments)   Pt is half [place] - prolonged paralysis due to compromised metabolism. Succinylcholine given on 07/07/15 at outside  hospital and pt experienced paralysis > 3 hours.  Due to being native [place], his mother states that he takes longer to "come out of it Pt is half [place] - prolonged paralysis due to compromised metabolism. Succinylcholine given on 07/07/15 at outside hospital and pt experienced paralysis > 3 hours.   Ciprofloxacin Other (See Comments)   IV only-- caused burning in arm    Vancomycin Itching, Swelling   Adhesive [tape] Hives, Rash   Wound Dressing Adhesive Hives, Rash        Medication List     STOP taking these medications    verapamil 120 MG CR tablet Commonly known as: CALAN-SR       TAKE these medications    acetaminophen 650 MG CR tablet Commonly known as: TYLENOL Take 1,300 mg by mouth every 8 (eight) hours as needed for pain.   amoxicillin-clavulanate 875-125 MG tablet Commonly known as: AUGMENTIN Take 1 tablet by mouth 2 (two) times daily for 3 days.   calcium elemental as carbonate 400 MG chewable tablet Commonly known as: BARIATRIC TUMS ULTRA Chew 2,000 mg by mouth 3 (three) times daily after meals.   Calcium-Magnesium-Zinc-D3 Tabs Take 1 tablet by mouth in the morning and at bedtime.   Cranberry-Vitamin C-Vitamin E 4200-20-3 MG-MG-UNIT Caps Take 2  capsules by mouth in the morning, at noon, and at bedtime.   Klor-Con M20 20 MEQ tablet Generic drug: potassium chloride SA Take 20 mEq by mouth daily.   levETIRAcetam 500 MG tablet Commonly known as: KEPPRA Take 500 mg by mouth 2 (two) times daily.   liver oil-zinc oxide 40 % ointment Commonly known as: DESITIN Apply 1 Application topically as needed for irritation.   metoprolol tartrate 50 MG tablet Commonly known as: LOPRESSOR Take 50 mg by mouth 2 (two) times daily.   multivitamin with minerals Tabs tablet Take 1 tablet by mouth daily in the afternoon.   nystatin cream Commonly known as: MYCOSTATIN Apply 1 Application topically daily as needed for dry skin.   ondansetron 4 MG  tablet Commonly known as: ZOFRAN Take 4 mg by mouth every 8 (eight) hours as needed for nausea or vomiting.   oxybutynin 5 MG tablet Commonly known as: DITROPAN Take 5 mg by mouth 2 (two) times daily.   polyethylene glycol 17 g packet Commonly known as: MIRALAX / GLYCOLAX Take 17 g by mouth daily.   rosuvastatin 20 MG tablet Commonly known as: CRESTOR Take 20 mg by mouth in the morning.   vitamin C 1000 MG tablet Take 1,000 mg by mouth daily with lunch.        Follow-up Information     Paliwal, Himanshu, MD. Schedule an appointment as soon as possible for a visit in 1 week(s).   Specialty: Family Medicine Contact information: [address] [place] [address] [phone]                Allergies  Allergen Reactions   Latex Hives   Succinylcholine Rash and Other (See Comments)    Pt is half [place] - prolonged paralysis due to compromised metabolism. Succinylcholine given on 07/07/15 at outside hospital and pt experienced paralysis > 3 hours.   Due to being native [place], his mother states that he takes longer to "come out of it  Pt is half [place] - prolonged paralysis due to compromised metabolism. Succinylcholine given on 07/07/15 at outside hospital and pt experienced paralysis > 3 hours.   Ciprofloxacin Other (See Comments)    IV only-- caused burning in arm    Vancomycin Itching and Swelling   Adhesive [Tape] Hives and Rash   Wound Dressing Adhesive Hives and Rash     Other Procedures/Studies: CT ABDOMEN PELVIS W CONTRAST Result Date: 10/26/2023 CLINICAL DATA:  Three days of abdominal distention, nausea, abdominal pain. Cloudy urine. Rule out partial obstruction. EXAM: CT ABDOMEN AND PELVIS WITH CONTRAST TECHNIQUE: Multidetector CT imaging of the abdomen and pelvis was performed using the standard protocol following bolus administration of intravenous contrast. RADIATION DOSE REDUCTION: This exam was performed according to the  departmental dose-optimization program which includes automated exposure control, adjustment of the mA and/or kV according to patient size and/or use of iterative reconstruction technique. CONTRAST:  75mL OMNIPAQUE IOHEXOL 350 MG/ML SOLN COMPARISON:  CT 08/15/2023 FINDINGS: Lower chest: No acute abnormality. Hepatobiliary: Cholecystectomy. No acute abnormality. Unremarkable liver. Pancreas: Unremarkable. Spleen: Unremarkable. Adrenals/Urinary Tract: Stable adrenal glands. No urinary calculi or hydronephrosis. Foley catheter in the nondistended bladder. Bladder wall thickening and mucosal hyperenhancement with perivesical stranding. Locules of gas in the bladder. Stomach/Bowel: Similar large broad-based chronic ventral hernia containing portions of the distal stomach, multiple loops of small bowel. Stomach is within normal limits. The small bowel is dilated measuring up to 5.0 cm in diameter. Possible transition point at the ostomy (  series 7/image 29). Fluid-filled small bowel with mural thickening and hyperenhancement of the wall and valvulae conniventes. Right colectomy. 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 Vascular/Lymphatic: No significant vascular findings are present. No enlarged abdominal or pelvic lymph nodes. Reproductive: No acute abnormality. Other: No free intraperitoneal fluid or air. VP shunt tubing terminating in the subcutaneous fat of the right lower quadrant. Musculoskeletal: Stable postsurgical changes from thoracolumbar spinal fixation and underlying spinal dysraphism. IMPRESSION: 1. There is wall thickening and mucosal hyperenhancement of the small bowel compatible with enteritis. 2. Dilated small bowel extending to the right lower quadrant ostomy. Possible transition point at the ostomy. Findings are indeterminate for ileus from enteritis or partial obstruction at the ostomy. 3. Bladder wall thickening and mucosal hyperenhancement with perivesical stranding.  Correlate with urinalysis to exclude cystitis. 4. Similar large broad-based chronic ventral hernia containing portions of the distal stomach, multiple loops of small bowel. Electronically Signed   By: Minerva Fester M.D.   On: 10/26/2023 22:13     TODAY-DAY OF DISCHARGE:  Subjective:   Ardine Bjork today has no headache,no chest abdominal pain,no new weakness tingling or numbness, feels much better wants to go home today.   Objective:   Blood pressure (!) 105/53, pulse 86, temperature 98.2 F (36.8 C), temperature source Oral, resp. rate 14, height 5\' 2"  (1.575 m), weight 74.4 kg, SpO2 93%.  Intake/Output Summary (Last 24 hours) at 10/28/2023 0914 Last data filed at 10/27/2023 1344 Gross per 24 hour  Intake --  Output 600 ml  Net -600 ml   Filed Weights   10/26/23 1726  Weight: 74.4 kg    Exam: Awake Alert, Oriented *3, No new F.N deficits, Normal affect [place].AT,PERRAL Supple Neck,No JVD, No cervical lymphadenopathy appriciated.  Symmetrical Chest wall movement, Good air movement bilaterally, CTAB RRR,No Gallops,Rubs or new Murmurs, No Parasternal Heave +ve B.Sounds, Abd Soft, Non tender, No organomegaly appriciated, No rebound -guarding or rigidity. No Cyanosis, Clubbing or edema, No new Rash or bruise   PERTINENT RADIOLOGIC STUDIES: CT ABDOMEN PELVIS W CONTRAST Result Date: 10/26/2023 CLINICAL DATA:  Three days of abdominal distention, nausea, abdominal pain. Cloudy urine. Rule out partial obstruction. EXAM: CT ABDOMEN AND PELVIS WITH CONTRAST TECHNIQUE: Multidetector CT imaging of the abdomen and pelvis was performed using the standard protocol following bolus administration of intravenous contrast. RADIATION DOSE REDUCTION: This exam was performed according to the departmental dose-optimization program which includes automated exposure control, adjustment of the mA and/or kV according to patient size and/or use of iterative reconstruction technique. CONTRAST:  75mL OMNIPAQUE  IOHEXOL 350 MG/ML SOLN COMPARISON:  CT 08/15/2023 FINDINGS: Lower chest: No acute abnormality. Hepatobiliary: Cholecystectomy. No acute abnormality. Unremarkable liver. Pancreas: Unremarkable. Spleen: Unremarkable. Adrenals/Urinary Tract: Stable adrenal glands. No urinary calculi or hydronephrosis. Foley catheter in the nondistended bladder. Bladder wall thickening and mucosal hyperenhancement with perivesical stranding. Locules of gas in the bladder. Stomach/Bowel: Similar large broad-based chronic ventral hernia containing portions of the distal stomach, multiple loops of small bowel. Stomach is within normal limits. The small bowel is dilated measuring up to 5.0 cm in diameter. Possible transition point at the ostomy (series 7/image 29). Fluid-filled small bowel with mural thickening and hyperenhancement of the wall and valvulae conniventes. Right colectomy. 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 Vascular/Lymphatic: No significant vascular findings are present. No enlarged abdominal or pelvic lymph nodes. Reproductive: No acute abnormality. Other: No free intraperitoneal fluid or air. VP shunt tubing  terminating in the subcutaneous fat of the right lower quadrant. Musculoskeletal: Stable postsurgical changes from thoracolumbar spinal fixation and underlying spinal dysraphism. IMPRESSION: 1. There is wall thickening and mucosal hyperenhancement of the small bowel compatible with enteritis. 2. Dilated small bowel extending to the right lower quadrant ostomy. Possible transition point at the ostomy. Findings are indeterminate for ileus from enteritis or partial obstruction at the ostomy. 3. Bladder wall thickening and mucosal hyperenhancement with perivesical stranding. Correlate with urinalysis to exclude cystitis. 4. Similar large broad-based chronic ventral hernia containing portions of the distal stomach, multiple loops of small bowel. Electronically Signed   By: Minerva Fester M.D.   On: 10/26/2023 22:13     PERTINENT LAB RESULTS: CBC: Recent Labs    10/26/23 1734 10/27/23 0332  WBC 16.1* 11.6*  HGB 15.6 14.9  HCT 46.4 45.7  PLT 301 291   CMET CMP     Component Value Date/Time   NA 133 (L) 10/28/2023 0545   K 4.6 10/28/2023 0545   CL 101 10/28/2023 0545   CO2 24 10/28/2023 0545   GLUCOSE 88 10/28/2023 0545   BUN 8 10/28/2023 0545   CREATININE 0.48 (L) 10/28/2023 0545   CALCIUM 8.0 (L) 10/28/2023 0545   PROT 6.7 10/26/2023 1734   ALBUMIN 3.1 (L) 10/26/2023 1734   AST 19 10/26/2023 1734   ALT 32 10/26/2023 1734   ALKPHOS 57 10/26/2023 1734   BILITOT 0.9 10/26/2023 1734   GFRNONAA >60 10/28/2023 0545    GFR Estimated Creatinine Clearance: 108.5 mL/min (A) (by C-G formula based on SCr of 0.48 mg/dL (L)). Recent Labs    10/26/23 1734  LIPASE 18   No results for input(s): "CKTOTAL", "CKMB", "CKMBINDEX", "TROPONINI" in the last 72 hours. Invalid input(s): "POCBNP" No results for input(s): "DDIMER" in the last 72 hours. No results for input(s): "HGBA1C" in the last 72 hours. No results for input(s): "CHOL", "HDL", "LDLCALC", "TRIG", "CHOLHDL", "LDLDIRECT" in the last 72 hours. No results for input(s): "TSH", "T4TOTAL", "T3FREE", "THYROIDAB" in the last 72 hours.  Invalid input(s): "FREET3" No results for input(s): "VITAMINB12", "FOLATE", "FERRITIN", "TIBC", "IRON", "RETICCTPCT" in the last 72 hours. Coags: No results for input(s): "INR" in the last 72 hours.  Invalid input(s): "PT" Microbiology: Recent Results (from the past 240 hours)  Gastrointestinal Panel by PCR , Stool     Status: None   Collection Time: 10/27/23  1:01 AM   Specimen: Stool  Result Value Ref Range Status   Campylobacter species NOT DETECTED NOT DETECTED Final   Plesimonas shigelloides NOT DETECTED NOT DETECTED Final   Salmonella species NOT DETECTED NOT DETECTED Final   Yersinia enterocolitica NOT DETECTED NOT DETECTED Final   Vibrio species NOT  DETECTED NOT DETECTED Final   Vibrio cholerae NOT DETECTED NOT DETECTED Final   Enteroaggregative E coli (EAEC) NOT DETECTED NOT DETECTED Final   Enteropathogenic E coli (EPEC) NOT DETECTED NOT DETECTED Final   Enterotoxigenic E coli (ETEC) NOT DETECTED NOT DETECTED Final   Shiga like toxin producing E coli (STEC) NOT DETECTED NOT DETECTED Final   Shigella/Enteroinvasive E coli (EIEC) NOT DETECTED NOT DETECTED Final   Cryptosporidium NOT DETECTED NOT DETECTED Final   Cyclospora cayetanensis NOT DETECTED NOT DETECTED Final   Entamoeba histolytica NOT DETECTED NOT DETECTED Final   Giardia lamblia NOT DETECTED NOT DETECTED Final   Adenovirus F40/41 NOT DETECTED NOT DETECTED Final   Astrovirus NOT DETECTED NOT DETECTED Final   Norovirus GI/GII NOT DETECTED NOT DETECTED Final  Rotavirus A NOT DETECTED NOT DETECTED Final   Sapovirus (I, II, IV, and V) NOT DETECTED NOT DETECTED Final    Comment: Performed at [facility], [address] [place], [place] [address]  Culture, blood (Routine X 2) w Reflex to ID Panel     Status: None (Preliminary result)   Collection Time: 10/27/23  2:46 AM   Specimen: BLOOD LEFT HAND  Result Value Ref Range Status   Specimen Description BLOOD LEFT HAND  Final   Special Requests   Final    BOTTLES DRAWN AEROBIC ONLY Blood Culture results may not be optimal due to an inadequate volume of blood received in culture bottles   Culture   Final    NO GROWTH 1 DAY Performed at [facility], [address], [place], [place] [address]    Report Status PENDING  Incomplete  Culture, blood (Routine X 2) w Reflex to ID Panel     Status: None (Preliminary result)   Collection Time: 10/27/23  2:50 AM   Specimen: BLOOD RIGHT HAND  Result Value Ref Range Status   Specimen Description BLOOD RIGHT HAND  Final   Special Requests   Final    BOTTLES DRAWN AEROBIC ONLY Blood Culture results may not be optimal due to an inadequate volume of blood received in culture  bottles   Culture   Final    NO GROWTH 1 DAY Performed at [facility], [address], [place], [place] [address]    Report Status PENDING  Incomplete    FURTHER DISCHARGE INSTRUCTIONS:  Get Medicines reviewed and adjusted: Please take all your medications with you for your next visit with your Primary MD  Laboratory/radiological data: Please request your Primary MD to go over all hospital tests and procedure/radiological results at the follow up, please ask your Primary MD to get all Hospital records sent to his/her office.  In some cases, they will be blood work, cultures and biopsy results pending at the time of your discharge. Please request that your primary care M.D. goes through all the records of your hospital data and follows up on these results.  Also Note the following: If you experience worsening of your admission symptoms, develop shortness of breath, life threatening emergency, suicidal or homicidal thoughts you must seek medical attention immediately by calling 911 or calling your MD immediately  if symptoms less severe.  You must read complete instructions/literature along with all the possible adverse reactions/side effects for all the Medicines you take and that have been prescribed to you. Take any new Medicines after you have completely understood and accpet all the possible adverse reactions/side effects.   Do not drive when taking Pain medications or sleeping medications (Benzodaizepines)  Do not take more than prescribed Pain, Sleep and Anxiety Medications. It is not advisable to combine anxiety,sleep and pain medications without talking with your primary care practitioner  Special Instructions: If you have smoked or chewed Tobacco  in the last 2 yrs please stop smoking, stop any regular Alcohol  and or any Recreational drug use.  Wear Seat belts while driving.  Please note: You were cared for by a hospitalist during your hospital stay. Once you are  discharged, your primary care physician will handle any further medical issues. Please note that NO REFILLS for any discharge medications will be authorized once you are discharged, as it is imperative that you return to your primary care physician (or establish a relationship with a primary care physician  if you do not have one) for your post hospital discharge needs so that they can reassess your need for medications and monitor your lab values.  Total Time spent coordinating discharge including counseling, education and face to face time equals greater than 30 minutes.  SignedJeoffrey Massed 10/28/2023 9:14 AM

## 2023-10-28 NOTE — Care Management (Signed)
Spoke w patient's mom at bedside, no DC needs identified

## 2023-11-01 LAB — CULTURE, BLOOD (ROUTINE X 2)
Culture: NO GROWTH
Culture: NO GROWTH

## 2024-01-14 ENCOUNTER — Encounter (HOSPITAL_COMMUNITY): Payer: Self-pay

## 2024-01-14 ENCOUNTER — Other Ambulatory Visit: Payer: Self-pay

## 2024-01-14 ENCOUNTER — Emergency Department (HOSPITAL_COMMUNITY)
Admission: EM | Admit: 2024-01-14 | Discharge: 2024-01-14 | Disposition: A | Discharge status: Home / Self Care | Attending: Emergency Medicine | Admitting: Emergency Medicine

## 2024-01-14 DIAGNOSIS — Z91040 Latex allergy status: Secondary | ICD-10-CM | POA: Insufficient documentation

## 2024-01-14 DIAGNOSIS — Z79899 Other long term (current) drug therapy: Secondary | ICD-10-CM | POA: Diagnosis not present

## 2024-01-14 DIAGNOSIS — I10 Essential (primary) hypertension: Secondary | ICD-10-CM | POA: Diagnosis not present

## 2024-01-14 DIAGNOSIS — Y732 Prosthetic and other implants, materials and accessory gastroenterology and urology devices associated with adverse incidents: Secondary | ICD-10-CM | POA: Insufficient documentation

## 2024-01-14 DIAGNOSIS — T83098A Other mechanical complication of other indwelling urethral catheter, initial encounter: Secondary | ICD-10-CM | POA: Diagnosis not present

## 2024-01-14 DIAGNOSIS — T83090A Other mechanical complication of cystostomy catheter, initial encounter: Secondary | ICD-10-CM

## 2024-01-14 DIAGNOSIS — R319 Hematuria, unspecified: Secondary | ICD-10-CM | POA: Diagnosis present

## 2024-01-14 LAB — URINALYSIS, W/ REFLEX TO CULTURE (INFECTION SUSPECTED): RBC / HPF: >50 RBC/hpf (ref 0–5)

## 2024-01-14 MED ORDER — CEFDINIR 300 MG PO CAPS
300.0000 mg | ORAL_CAPSULE | Freq: Two times a day (BID) | ORAL | 0 refills | Status: AC
Start: 2024-01-14 — End: 2024-01-21

## 2024-01-14 NOTE — ED Triage Notes (Signed)
 Pt arrived with caregiver who reports suprapubic cath was not draining this morning. States she took it out, flushed it as she has done for the last the 2 years and re inserted. States after patient had hematuria. Patient reports LUQ pain, no other symptoms.

## 2024-01-14 NOTE — ED Provider Notes (Signed)
  EMERGENCY DEPARTMENT AT [facility] Provider Note   CSN: [id number] Arrival date & time: 01/14/24  1217     History  Chief Complaint  Patient presents with   Hematuria    [name] is a 43 y.o. male.  The history is provided by the patient, a parent and medical records. No language interpreter was used.  Hematuria This is a new problem. The current episode started 6 to 12 hours ago. The problem occurs constantly. The problem has not changed since onset.Pertinent negatives include no chest pain, no abdominal pain, no headaches and no shortness of breath. Nothing aggravates the symptoms. Nothing relieves the symptoms. He has tried nothing for the symptoms. The treatment provided no relief.       Home Medications Prior to Admission medications   Medication Sig Start Date End Date Taking? Authorizing Provider  acetaminophen  (TYLENOL ) 650 MG CR tablet Take 1,300 mg by mouth every 8 (eight) hours as needed for pain.    [provider]  Ascorbic Acid (VITAMIN C ) 1000 MG tablet Take 1,000 mg by mouth daily with lunch.    [provider]  calcium  elemental as carbonate (BARIATRIC TUMS ULTRA) 400 MG chewable tablet Chew 2,000 mg by mouth 3 (three) times daily after meals.    [provider]  Cranberry-Vitamin C -Vitamin E 4200-20-3 MG-MG-UNIT CAPS Take 2 capsules by mouth in the morning, at noon, and at bedtime.    [provider]  KLOR-CON  M20 20 MEQ tablet Take 20 mEq by mouth daily. 03/14/23   [provider]  levETIRAcetam  (KEPPRA ) 500 MG tablet Take 500 mg by mouth 2 (two) times daily.    [provider]  liver oil-zinc oxide (DESITIN) 40 % ointment Apply 1 Application topically as needed for irritation.    [provider]  metoprolol  tartrate (LOPRESSOR ) 50 MG tablet Take 50 mg by mouth 2 (two) times daily.    [provider]  Multiple Minerals-Vitamins (CALCIUM -MAGNESIUM -ZINC-D3) TABS Take  1 tablet by mouth in the morning and at bedtime.    [provider]  Multiple Vitamin (MULTIVITAMIN WITH MINERALS) TABS tablet Take 1 tablet by mouth daily in the afternoon.    [provider]  nystatin cream (MYCOSTATIN) Apply 1 Application topically daily as needed for dry skin. 05/01/23   [provider]  ondansetron  (ZOFRAN ) 4 MG tablet Take 4 mg by mouth every 8 (eight) hours as needed for nausea or vomiting. 03/14/23   [provider]  oxybutynin  (DITROPAN ) 5 MG tablet Take 5 mg by mouth 2 (two) times daily. 10/03/19   [provider]  polyethylene glycol (MIRALAX  / GLYCOLAX ) 17 g packet Take 17 g by mouth daily. 08/17/23   Wynetta Heckle, MD  rosuvastatin  (CRESTOR ) 20 MG tablet Take 20 mg by mouth in the morning. 09/10/18   [provider]      Allergies    Latex, Succinylcholine , Ciprofloxacin, Vancomycin, Adhesive [tape], and Wound dressing adhesive    Review of Systems   Review of Systems  Constitutional:  Negative for chills, fatigue and fever.  HENT:  Negative for congestion.   Respiratory:  Negative for cough, chest tightness and shortness of breath.   Cardiovascular:  Negative for chest pain and palpitations.  Gastrointestinal:  Negative for abdominal pain, constipation, diarrhea, nausea and vomiting.  Genitourinary:  Positive for difficulty urinating and hematuria. Negative for dysuria and flank pain.  Musculoskeletal:  Negative for back pain, neck pain and neck stiffness.  Skin:  Negative for rash and wound.  Neurological:  Negative for headaches.  Psychiatric/Behavioral:  Negative for agitation and confusion.   All other systems reviewed and are negative.   Physical Exam Updated Vital Signs BP (!) 144/89 (BP Location: Left Arm)   Pulse 97   Temp 98.3 F (36.8 C) (Oral)   Resp 18   SpO2 100%  Physical Exam Vitals and nursing note reviewed. Exam conducted with a chaperone present.  Constitutional:      General: He is  not in acute distress.    Appearance: He is well-developed. He is not ill-appearing, toxic-appearing or diaphoretic.  HENT:     Head: Normocephalic and atraumatic.     Mouth/Throat:     Mouth: Mucous membranes are moist.  Eyes:     Extraocular Movements: Extraocular movements intact.     Conjunctiva/sclera: Conjunctivae normal.     Pupils: Pupils are equal, round, and reactive to light.  Cardiovascular:     Rate and Rhythm: Normal rate and regular rhythm.     Heart sounds: No murmur heard. Pulmonary:     Effort: Pulmonary effort is normal. No respiratory distress.     Breath sounds: Normal breath sounds. No wheezing, rhonchi or rales.  Chest:     Chest wall: No tenderness.  Abdominal:     General: Abdomen is flat.     Palpations: Abdomen is soft.     Tenderness: There is no abdominal tenderness.  Genitourinary:      Comments: Patient has clotted chunks of blood coming out of his penis.  Also some urine mixed in.  Bright blood coming from his catheter site. Musculoskeletal:        General: No swelling or tenderness.     Cervical back: Neck supple.     Right lower leg: No edema.     Left lower leg: No edema.  Skin:    General: Skin is warm and dry.     Capillary Refill: Capillary refill takes less than 2 seconds.     Findings: No erythema or rash.  Neurological:     Mental Status: He is alert.  Psychiatric:        Mood and Affect: Mood normal.     ED Results / Procedures / Treatments   Labs (all labs ordered are listed, but only abnormal results are displayed) Labs Reviewed  URINE CULTURE  URINALYSIS, W/ REFLEX TO CULTURE (INFECTION SUSPECTED)    EKG None  Radiology No results found.  Procedures Procedures    Medications Ordered in ED Medications - No data to display  ED Course/ Medical Decision Making/ A&P                                 Medical Decision Making Amount and/or Complexity of Data Reviewed Labs: ordered.    [name] is a 43  y.o. male with a past medical history significant for spina bifida with neurogenic bladder and chronic superior catheter use, ileostomy, paraplegia, VP shunt, and.  Cholecystectomy who presents for catheter problem.  According to patient's mother who provides care for the patient, he started having decreased urine, through the catheter over the last day or 2 and it started leaking out of his penis.  She tried to exchange the catheter herself by pulling out the old one, cleaning it out by "washing it with water  and using a meat skewer" to clean out the lumen.  She reports that after doing this she tried to replace it and it would not work well.  She reports she moved it in and out multiple times to try and reposition it but it was unsuccessful.  Now there is just bright blood coming through the catheter and chunks of blood clot coming through the patient's penis with urine as well.  Otherwise patient is denying complaints and denies any pain, nausea, vomiting, fevers, chills, or other symptoms of dysuria.  On exam, patient does have blood clots coming from the penis and there is some bright blood coming through the catheter.  There does not appear to be much urine coming through the catheter and more urine coming out of the penis.  His abdomen was nontender.  Patient otherwise resting.  A chaperone was present during initial exam.  I am unsure if the prepubic catheter was damaged during the home cleaning process and exchange and I anticipate the patient will likely need a new suprapubic catheter placed.  Given the bleeding, urology was called and they will come see the patient.  We will get the complex catheter cart to the bedside so they can use it to assess.  Unclear if the catheter was damaged or if it is in the wrong position.  Given the lack of pain have less suspicion for new tract.  Anticipate follow-up on urology recommendations.  2:29 PM Urology was able to come to the bedside and replaced the  suprapubic catheter.  There was a large amount of blood and the balloon was reportedly deflated inside.  She replaced the catheter and now it is working well.  She feels the patient needs to follow-up with Dr. Parke Boll for some Botox  injections and for urology follow-up.  She did request we get a urinalysis and a culture and treat with antibiotics.  She did not feel needed to wait for the results of the urinalysis at this time.  Patient will get p.o. challenge and we will get the urine sent.  Anticipate discharge shortly.         Final Clinical Impression(s) / ED Diagnoses Final diagnoses:  Hematuria, unspecified type  Obstruction of suprapubic catheter, initial encounter (HCC)    Rx / DC Orders ED Discharge Orders          Ordered    cefdinir (OMNICEF) 300 MG capsule  2 times daily        01/14/24 1441           Clinical Impression: 1. Hematuria, unspecified type   2. Obstruction of suprapubic catheter, initial encounter [facility])     Disposition: Discharge  Condition: Good  I have discussed the results, Dx and Tx plan with the pt(& family if present). He/she/they expressed understanding and agree(s) with the plan. Discharge instructions discussed at great length. Strict return precautions discussed and pt &/or family have verbalized understanding of the instructions. No further questions at time of discharge.    New Prescriptions   CEFDINIR (OMNICEF) 300 MG CAPSULE    Take 1 capsule (300 mg total) by mouth 2 (two) times daily for 7 days.    Follow Up: Samson Croak, MD [address] [place] [address] [phone]     [facility] Emergency Department at [facility] [address] [place]  [address] [phone]          Del Wiseman, Marine Sia, MD 01/14/24 [phone]

## 2024-01-14 NOTE — Consult Note (Signed)
 Urology Consult Note   Requesting Attending Physician:  Tegeler, Marine Sia, * Service Providing Consult: Urology  Consulting Attending: Homero Luster, MD   Reason for Consult:  SPT malfunction, hematuria  HPI: [name] is seen in consultation for reasons noted above at the request of Tegeler, Marine Sia, *  for evaluation of hematuria and SPT malfunction.  This is a 43 y.o. male with history of spina bifida and subsequent neurogenic bladder managed with indwelling suprapubic tube.  He presents today after his mother noted that his catheter was not draining well.  She attempted to remove and replace the catheter at home multiple times and patient began having blood per urethra which prompted her to bring him to the emergency room.  Patient sees Dr. Parke Boll.  He receives bladder botox , last procedure was on 06/02/23.  In the ED his indwelling suprapubic tube was noted to have a deflated balloon.  This was removed, and a new 22 French suprapubic tube was placed under sterile technique.  Upon irrigation of the Foley urine and clots were noted to drain per urethra.  I could not distend his bladder.  I then elected to place a urethral catheter.  A 16 French coud catheter was placed per urethra.  I was initially unable to irrigate this catheter.  I removed it and multiple urethral clots were expelled.  I then replaced the catheter under sterile conditions and was then able to flush between the suprapubic tube and the urethral catheter, confirming adequate position of the suprapubic tube.  Leaving the urethral catheter capped I then was able to fill the bladder slightly more with the suprapubic tube however he had continued significant bladder spasms and very low bladder capacity so there was significant leakage around the urethral Foley.  I then removed the urethral catheter and pinched the urethral shot with 1 hand while flushing the suprapubic catheter which yielded a more successful bladder  distention.  I proceeded with this procedure until the draining urine from both the urethra and the suprapubic tube was clear.   Past Medical History: Past Medical History:  Diagnosis Date   Arnold-Chiari malformation (HCC)    Congenital paraplegia (HCC)    MID-ABDOMIN DOWN  S/P SPINA BIFIDA   GERD (gastroesophageal reflux disease)    Heart palpitations    SECONDARY TO VP SHUNT DRAIN   History of kidney stones    History of seizure    per pt mother, no seizures since age 8   Hypertension    Ileostomy in place [facility]) 2014   Migraines followed by neurologist @[facility]    taking verapamil    Neurogenic bladder    INCONTINENT -   Neurogenic bowel    S/P VP shunt    Spina bifida with hydrocephalus, lumbar region (HCC)    CONGENITAL--   VP SHUNT in place--  PARALYZIED MID ABDOMEN DOWN   Urinary incontinence with continuous leakage    Wears glasses    Wheelchair bound    CAN TRASFER SELF WITHOUT BOARD    Past Surgical History:  Past Surgical History:  Procedure Laterality Date   BACK SURGERY  infant   "get skin to stretch over his back; spinal bifida"   BACK SURGERY  age 81   scoliosis and  removed hump on his back (kyphosis) and put rods in to stabalize back"   BOTOX  INJECTION N/A 11/14/2018   Procedure: BOTOX  INJECTION CYSTOSCOPY;  Surgeon: Samson Croak, MD;  Location: [facility];  Service: Urology;  Laterality: N/A;   BOTOX  INJECTION N/A 05/22/2019   Procedure: BOTOX  INJECTION;  Surgeon: Samson Croak, MD;  Location: [facility];  Service: Urology;  Laterality: N/A;   BOTOX  INJECTION N/A 11/13/2019   Procedure: CYSTOSCOPY BOTOX  INJECTION;  Surgeon: Samson Croak, MD;  Location: [facility];  Service: Urology;  Laterality: N/A;   BOTOX  INJECTION N/A 07/01/2020   Procedure: CYSTOSCOPY, BOTOX  INJECTION, BLADDER FULGERATION, SUPRAPUBIC TUBE EXCHANGE;  Surgeon: Adelbert Homans, MD;  Location: [facility];  Service: Urology;  Laterality: N/A;   BOTOX  INJECTION N/A 12/14/2020   Procedure:  BOTOX  INJECTION 300 UNITS SUPRAPUBIC TUBE CHANGE;  Surgeon: Samson Croak, MD;  Location: [facility];  Service: Urology;  Laterality: N/A;   BOTOX  INJECTION N/A 06/02/2023   Procedure: CYSTOSCOPY BOTOX  INJECTION 200 UNITS;  Surgeon: Samson Croak, MD;  Location: [facility];  Service: Urology;  Laterality: N/A;  1 HR FOR CASE   CHOLECYSTECTOMY N/A 07/06/2013   Procedure: LAPAROSCOPIC CHOLECYSTECTOMY WITH INTRAOPERATIVE CHOLANGIOGRAM;  Surgeon: Evander Hills, DO;  Location: [facility];  Service: General;  Laterality: N/A;   COLON RESECTION N/A 07/08/2013   Procedure: EXPLORATORY LAPAROSCOPY, DIAGNOSTIC LAPAROSCOPY, PARTIAL COLECTOMY, ABDOMINAL WASHOUT, ABDOMINAL WOUND VAC;  Surgeon: Evander Hills, DO;  Location: [facility];  Service: General;  Laterality: N/A;   CYSTOSCOPY WITH LITHOLAPAXY N/A 05/09/2022   Procedure: CYSTOSCOPY WITH LITHOLAPAXY, BOTOX  INJECTION;  Surgeon: Samson Croak, MD;  Location: [facility];  Service: Urology;  Laterality: N/A;   HIP SURGERY Bilateral AS CHILD   TENDON RELEASE   HOLMIUM LASER APPLICATION N/A 07/01/2020   Procedure: HOLMIUM LASER OF BLADDER STONE;  Surgeon: Adelbert Homans, MD;  Location: [facility];  Service: Urology;  Laterality: N/A;   INCISION AND DRAINAGE OF WOUND N/A 11/04/2013   Procedure: IRRIGATION AND DEBRIDEMENT ABDOMINAL WOUND WITH PLACEMENT OF ACELL/VAC;  Surgeon: Marilou Showman, DO;  Location: [facility];  Service: Plastics;  Laterality: N/A;   INSERTION OF SUPRAPUBIC CATHETER N/A 12/23/2019   Procedure: FLEXIBLE CYSTOSCOPE WITH FOLEY PLACEMENT OVER WIRE.;  Surgeon: Roxane Copp, MD;  Location: [facility];  Service: Urology;  Laterality: N/A;   INSERTION OF SUPRAPUBIC CATHETER N/A 07/26/2021   Procedure: EXCHANGE SUPRAPUBIC CATHETER AMD BOTOX  INJECTION 300 UNITS;  Surgeon: Samson Croak, MD;  Location: [facility];  Service: Urology;  Laterality: N/A;   INSERTION OF SUPRAPUBIC CATHETER N/A 06/02/2023   Procedure: SUPRAPUBIC CATHETER  CHANGE;  Surgeon: Samson Croak, MD;  Location: [facility];  Service: Urology;  Laterality: N/A;   IR CATHETER TUBE CHANGE  02/15/2019   IR CATHETER TUBE CHANGE  03/01/2019   IR CATHETER TUBE CHANGE  05/06/2019   IR CATHETER TUBE CHANGE  03/06/2020   IR CATHETER TUBE CHANGE  04/16/2020   IR CATHETER TUBE CHANGE  04/29/2020   ORCHIOPEXY Bilateral AS CHILD   UNDESCENDED TESTIS   RIGHT COLECTOMY/  APPENDECTOMY/  ILEOSTOMY  NOV 2014  [facility]   surgical complication with cholecystectomy   UMBILICAL HERNIA REPAIR  child   VENTRICULOPERITONEAL SHUNT  11-12-2018 per pt and mother---LAST REVISION ~ 06/2015   DUE TO ABD WOUND--- SHUNT DRAINS TO HEART (12/23/2015)    Medication: No current facility-administered medications for this encounter.   Current Outpatient Medications  Medication Sig Dispense Refill   acetaminophen  (TYLENOL ) 650 MG CR tablet Take 1,300 mg by mouth every 8 (eight) hours as needed for pain.     Ascorbic Acid (VITAMIN C ) 1000 MG tablet  Take 1,000 mg by mouth daily with lunch.     calcium  elemental as carbonate (BARIATRIC TUMS ULTRA) 400 MG chewable tablet Chew 2,000 mg by mouth 3 (three) times daily after meals.     Cranberry-Vitamin C -Vitamin E 4200-20-3 MG-MG-UNIT CAPS Take 2 capsules by mouth in the morning, at noon, and at bedtime.     KLOR-CON  M20 20 MEQ tablet Take 20 mEq by mouth daily.     levETIRAcetam  (KEPPRA ) 500 MG tablet Take 500 mg by mouth 2 (two) times daily.     liver oil-zinc oxide (DESITIN) 40 % ointment Apply 1 Application topically as needed for irritation.     metoprolol  tartrate (LOPRESSOR ) 50 MG tablet Take 50 mg by mouth 2 (two) times daily.     Multiple Minerals-Vitamins (CALCIUM -MAGNESIUM -ZINC-D3) TABS Take 1 tablet by mouth in the morning and at bedtime.     Multiple Vitamin (MULTIVITAMIN WITH MINERALS) TABS tablet Take 1 tablet by mouth daily in the afternoon.     nystatin cream (MYCOSTATIN) Apply 1 Application topically daily as needed for dry skin.      ondansetron  (ZOFRAN ) 4 MG tablet Take 4 mg by mouth every 8 (eight) hours as needed for nausea or vomiting.     oxybutynin  (DITROPAN ) 5 MG tablet Take 5 mg by mouth 2 (two) times daily.     polyethylene glycol (MIRALAX  / GLYCOLAX ) 17 g packet Take 17 g by mouth daily. 30 each 0   rosuvastatin  (CRESTOR ) 20 MG tablet Take 20 mg by mouth in the morning.      Allergies: Allergies  Allergen Reactions   Latex Hives   Succinylcholine  Rash and Other (See Comments)    Pt is half [place] - prolonged paralysis due to compromised metabolism. Succinylcholine  given on 07/07/15 at outside hospital and pt experienced paralysis > 3 hours.   Due to being native [place], his mother states that he takes longer to "come out of it  Pt is half [place] - prolonged paralysis due to compromised metabolism. Succinylcholine  given on 07/07/15 at outside hospital and pt experienced paralysis > 3 hours.   Ciprofloxacin Other (See Comments)    IV only-- caused burning in arm    Vancomycin Itching and Swelling   Adhesive [Tape] Hives and Rash   Wound Dressing Adhesive Hives and Rash    Social History: Social History   Tobacco Use   Smoking status: Never    Passive exposure: Never   Smokeless tobacco: Never  Vaping Use   Vaping status: Never Used  Substance Use Topics   Alcohol use: No   Drug use: Never    Family History Family History  Problem Relation Age of Onset   Diabetes Other    Hypertension Other    Cancer Other    Stroke Other     Review of Systems 10 systems were reviewed and are negative except as noted specifically in the HPI.  Objective   Vital signs in last 24 hours: BP (!) 144/89 (BP Location: Left Arm)   Pulse 97   Temp 98.3 F (36.8 C) (Oral)   Resp 18   SpO2 100%   Physical Exam General: NAD, A&O, resting, appropriate HEENT: [place]/AT, EOMI, MMM Pulmonary: Normal work of breathing Cardiovascular: HDS, adequate peripheral perfusion Abdomen: Obese, soft abdomen.   Suprapubic tube site clean dry and intact without signs of infection. GU: On him sized phallus with clots at the meatus. Extremities: warm and well perfused Neuro: Sleeping  Most Recent Labs: Lab Results  Component Value Date   WBC 11.6 (H) 10/27/2023   HGB 14.9 10/27/2023   HCT 45.7 10/27/2023   PLT 291 10/27/2023    Lab Results  Component Value Date   NA 133 (L) 10/28/2023   K 4.6 10/28/2023   CL 101 10/28/2023   CO2 24 10/28/2023   BUN 8 10/28/2023   CREATININE 0.48 (L) 10/28/2023   CALCIUM  8.0 (L) 10/28/2023   MG 1.8 08/15/2023   PHOS 4.5 11/20/2015    Lab Results  Component Value Date   INR 1.0 01/17/2020   APTT 34 07/23/2015     Urine Culture: @[id number] (laburin,org,[id number])@   IMAGING: No results found.  ------  Assessment:  43 y.o. male with history of spina bifida and neurogenic bladder managed with suprapubic tube and bladder Botox .  He presented today with suprapubic tube malfunction.  I replaced his 22 French suprapubic tube at bedside without complication.  He has very limited bladder capacity and likely poor compliance.  He is overdue for bladder Botox  at this time.  I was unable to meaningfully distend his bladder but did flush him to the best my ability both through a urethral catheter and suprapubic catheter removing any clots from the urethra.  I explained to his mother that he will likely continue to drain per urethra but that his suprapubic tube is adequately positioned.  He should call the office on Monday to get scheduled for bladder Botox  as this will likely help significantly with his catheter drainage.  We will also obtain urine studies and send him on antibiotics given he has had multiple nonsterile Foleys placed at home.  Low concern for bladder stones at this time, though given his history, could consider evaluation with a KUB if he has persistent gross hematuria.  His bladder will be evaluated cystoscopically at his next bladder  Botox .   Recommendations: - 83 French Suprapubic catheter placed today - Patient should be on scheduled Ditropan  twice daily - Patient to call the office tomorrow to inquire about next bladder Botox  with Dr. Parke Boll - Please obtain UA/urine culture.  Recommend sending patient on 7 days of Omnicef antibiotic based on prior cultures.   Thank you for this consult. Please contact the urology consult pager with any further questions/concerns.

## 2024-01-14 NOTE — Discharge Instructions (Signed)
 Your catheter was able to be replaced today by urology and is now draining better.  I suspect the balloon may have been functioning properly preventing the urine drainage.  She was able to replace it successfully and sent off a urine.  Please take the antibiotics and follow-up with them in clinic.  If any symptoms change or worsen acutely, return to the nearest emergency department.

## 2024-01-15 NOTE — Progress Notes (Unsigned)
 01/16/24- 42 yoM for sleep evaluation courtesy of Dr Lorella Roles with concern of OSA Medical problem list includes Nocturnal Hypoxemia, HTN, Arnold Chiari / Spina Bifida, Congenital Paraplegia, Suprapubic Catheter,  VP Shunt, Ileostomy, CKD, Mother here.  Discussed the use of AI scribe software for clinical note transcription with the patient, who gave verbal consent to proceed. History of Present Illness   [name] is a 43 year old male who presents with nocturnal hypoxemia. He was referred by Dr. Palawal for evaluation of low oxygen  levels during sleep.  He experiences nocturnal oxygen  desaturation, first identified during hospital stays in January and March, where supplemental oxygen  was required. Desaturation was detected by a machine alarm at night. Mother confirms snoring and probable apneas at night, and there is no family history of sleep apnea. He has not had surgeries involving the nose or throat. A VP shunt is in place, but there are no known heart or lung conditions.  He does not use medications for sleep or alertness and primarily consumes water . Daytime sleepiness occurs only when unwell. He naps for 30 minutes to an hour, sometimes twice daily, usually postprandially.   Mother helps manage his suprapubic catheter and ileostomy.    In-center study needed due to medical complexity.  Prior to Admission medications   Medication Sig Start Date End Date Taking? Authorizing Provider  acetaminophen  (TYLENOL ) 650 MG CR tablet Take 1,300 mg by mouth every 8 (eight) hours as needed for pain.   Yes [provider]  Ascorbic Acid (VITAMIN C ) 1000 MG tablet Take 1,000 mg by mouth daily with lunch.   Yes [provider]  calcium  elemental as carbonate (BARIATRIC TUMS ULTRA) 400 MG chewable tablet Chew 2,000 mg by mouth 3 (three) times daily after meals.   Yes [provider]  cefdinir (OMNICEF) 300 MG capsule Take 1 capsule (300 mg total) by mouth 2 (two)  times daily for 7 days. 01/14/24 01/21/24 Yes Tegeler, Marine Sia, MD  Cranberry-Vitamin C -Vitamin E 4200-20-3 MG-MG-UNIT CAPS Take 2 capsules by mouth in the morning, at noon, and at bedtime.   Yes [provider]  KLOR-CON  M20 20 MEQ tablet Take 20 mEq by mouth daily. 03/14/23  Yes [provider]  levETIRAcetam  (KEPPRA ) 500 MG tablet Take 500 mg by mouth 2 (two) times daily.   Yes [provider]  liver oil-zinc oxide (DESITIN) 40 % ointment Apply 1 Application topically as needed for irritation.   Yes [provider]  metoprolol  tartrate (LOPRESSOR ) 50 MG tablet Take 50 mg by mouth 2 (two) times daily.   Yes [provider]  Multiple Minerals-Vitamins (CALCIUM -MAGNESIUM -ZINC-D3) TABS Take 1 tablet by mouth in the morning and at bedtime.   Yes [provider]  Multiple Vitamin (MULTIVITAMIN WITH MINERALS) TABS tablet Take 1 tablet by mouth daily in the afternoon.   Yes [provider]  nystatin cream (MYCOSTATIN) Apply 1 Application topically daily as needed for dry skin. 05/01/23  Yes [provider]  ondansetron  (ZOFRAN ) 4 MG tablet Take 4 mg by mouth every 8 (eight) hours as needed for nausea or vomiting. 03/14/23  Yes [provider]  oxybutynin  (DITROPAN ) 5 MG tablet Take 5 mg by mouth 2 (two) times daily. 10/03/19  Yes [provider]  polyethylene glycol (MIRALAX  / GLYCOLAX ) 17 g packet Take 17 g by mouth daily. 08/17/23  Yes Wynetta Heckle, MD  rosuvastatin  (CRESTOR ) 20 MG tablet Take 20 mg by mouth in the morning. 09/10/18  Yes  [provider]   Past Medical History:  Diagnosis Date   Arnold-Chiari malformation (HCC)    Congenital paraplegia (HCC)    MID-ABDOMIN DOWN  S/P SPINA BIFIDA   GERD (gastroesophageal reflux disease)    Heart palpitations    SECONDARY TO VP SHUNT DRAIN   History of kidney stones    History of seizure    per pt mother, no seizures since age 43   Hypertension     Ileostomy in place [facility]) 2014   Migraines followed by neurologist @[facility]    taking verapamil    Neurogenic bladder    INCONTINENT -   Neurogenic bowel    S/P VP shunt    Spina bifida with hydrocephalus, lumbar region (HCC)    CONGENITAL--   VP SHUNT in place--  PARALYZIED MID ABDOMEN DOWN   Urinary incontinence with continuous leakage    Wears glasses    Wheelchair bound    CAN TRASFER SELF WITHOUT BOARD   Past Surgical History:  Procedure Laterality Date   BACK SURGERY  infant   "get skin to stretch over his back; spinal bifida"   BACK SURGERY  age 59   scoliosis and  removed hump on his back (kyphosis) and put rods in to stabalize back"   BOTOX  INJECTION N/A 11/14/2018   Procedure: BOTOX  INJECTION CYSTOSCOPY;  Surgeon: Samson Croak, MD;  Location: [facility];  Service: Urology;  Laterality: N/A;   BOTOX  INJECTION N/A 05/22/2019   Procedure: BOTOX  INJECTION;  Surgeon: Samson Croak, MD;  Location: [facility];  Service: Urology;  Laterality: N/A;   BOTOX  INJECTION N/A 11/13/2019   Procedure: CYSTOSCOPY BOTOX  INJECTION;  Surgeon: Samson Croak, MD;  Location: [facility];  Service: Urology;  Laterality: N/A;   BOTOX  INJECTION N/A 07/01/2020   Procedure: CYSTOSCOPY, BOTOX  INJECTION, BLADDER FULGERATION, SUPRAPUBIC TUBE EXCHANGE;  Surgeon: Adelbert Homans, MD;  Location: [facility];  Service: Urology;  Laterality: N/A;   BOTOX  INJECTION N/A 12/14/2020   Procedure: BOTOX  INJECTION 300 UNITS SUPRAPUBIC TUBE CHANGE;  Surgeon: Samson Croak, MD;  Location: [facility];  Service: Urology;  Laterality: N/A;   BOTOX  INJECTION N/A 06/02/2023   Procedure: CYSTOSCOPY BOTOX  INJECTION 200 UNITS;  Surgeon: Samson Croak, MD;  Location: [facility];  Service: Urology;  Laterality: N/A;  1 HR FOR CASE   CHOLECYSTECTOMY N/A 07/06/2013   Procedure: LAPAROSCOPIC CHOLECYSTECTOMY WITH INTRAOPERATIVE CHOLANGIOGRAM;  Surgeon: Evander Hills, DO;  Location: [facility];  Service: General;  Laterality: N/A;   COLON  RESECTION N/A 07/08/2013   Procedure: EXPLORATORY LAPAROSCOPY, DIAGNOSTIC LAPAROSCOPY, PARTIAL COLECTOMY, ABDOMINAL WASHOUT, ABDOMINAL WOUND VAC;  Surgeon: Evander Hills, DO;  Location: [facility];  Service: General;  Laterality: N/A;   CYSTOSCOPY WITH LITHOLAPAXY N/A 05/09/2022   Procedure: CYSTOSCOPY WITH LITHOLAPAXY, BOTOX  INJECTION;  Surgeon: Samson Croak, MD;  Location: [facility];  Service: Urology;  Laterality: N/A;   HIP SURGERY Bilateral AS CHILD   TENDON RELEASE   HOLMIUM LASER APPLICATION N/A 07/01/2020   Procedure: HOLMIUM LASER OF BLADDER STONE;  Surgeon: Adelbert Homans, MD;  Location: [facility];  Service: Urology;  Laterality: N/A;   INCISION AND DRAINAGE OF WOUND N/A 11/04/2013   Procedure: IRRIGATION AND DEBRIDEMENT ABDOMINAL WOUND WITH PLACEMENT OF ACELL/VAC;  Surgeon: Marilou Showman, DO;  Location: [facility];  Service: Plastics;  Laterality: N/A;   INSERTION OF SUPRAPUBIC CATHETER N/A 12/23/2019   Procedure: FLEXIBLE CYSTOSCOPE WITH FOLEY PLACEMENT OVER WIRE.;  Surgeon:  Roxane Copp, MD;  Location: [facility];  Service: Urology;  Laterality: N/A;   INSERTION OF SUPRAPUBIC CATHETER N/A 07/26/2021   Procedure: EXCHANGE SUPRAPUBIC CATHETER AMD BOTOX  INJECTION 300 UNITS;  Surgeon: Samson Croak, MD;  Location[facility];  Service: Urology;  Laterality: N/A;   INSERTION OF SUPRAPUBIC CATHETER N/A 06/02/2023   Procedure: SUPRAPUBIC CATHETER CHANGE;  Surgeon: Samson Croak, MD;  Location[facility];  Service: Urology;  Laterality: N/A;   IR CATHETER TUBE CHANGE  02/15/2019   IR CATHETER TUBE CHANGE  03/01/2019   IR CATHETER TUBE CHANGE  05/06/2019   IR CATHETER TUBE CHANGE  03/06/2020   IR CATHETER TUBE CHANGE  04/16/2020   IR CATHETER TUBE CHANGE  04/29/2020   ORCHIOPEXY Bilateral AS CHILD   UNDESCENDED TESTIS   RIGHT COLECTOMY/  APPENDECTOMY/  ILEOSTOMY  NOV 2014  [facility]   surgical complication with cholecystectomy   UMBILICAL HERNIA REPAIR  child    VENTRICULOPERITONEAL SHUNT  11-12-2018 per pt and mother---LAST REVISION ~ 06/2015   DUE TO ABD WOUND--- SHUNT DRAINS TO HEART (12/23/2015)   Family History  Problem Relation Age of Onset   Diabetes Other    Hypertension Other    Cancer Other    Stroke Other    Social History   Socioeconomic History   Marital status: Single    Spouse name: Not on file   Number of children: Not on file   Years of education: Not on file   Highest education level: Not on file  Occupational History   Not on file  Tobacco Use   Smoking status: Never    Passive exposure: Never   Smokeless tobacco: Never  Vaping Use   Vaping status: Never Used  Substance and Sexual Activity   Alcohol use: No   Drug use: Never   Sexual activity: Never  Other Topics Concern   Not on file  Social History Narrative   Not on file   Social Drivers of Health   Financial Resource Strain: Not on file  Food Insecurity: No Food Insecurity (10/27/2023)   Hunger Vital Sign    Worried About Running Out of Food in the Last Year: Never true    Ran Out of Food in the Last Year: Never true  Transportation Needs: No Transportation Needs (10/27/2023)   PRAPARE - Administrator, Civil Service (Medical): No    Lack of Transportation (Non-Medical): No  Physical Activity: Not on file  Stress: Not on file  Social Connections: Not on file  Intimate Partner Violence: Not At Risk (10/27/2023)   Humiliation, Afraid, Rape, and Kick questionnaire    Fear of Current or Ex-Partner: No    Emotionally Abused: No    Physically Abused: No    Sexually Abused: No   ROS-see HPI   + = positive Constitutional:    weight loss, night sweats, fevers, chills, fatigue, lassitude. HEENT:    headaches, difficulty swallowing, tooth/dental problems, sore throat,       sneezing, itching, ear ache, nasal congestion, post nasal drip, snoring CV:    chest pain, orthopnea, PND, swelling in lower extremities, anasarca,                                    dizziness, palpitations Resp:   shortness of breath with exertion or at rest.  productive cough,   non-productive cough, coughing up of blood.              change in color of mucus.  wheezing.   Skin:    rash or lesions. GI:  No-   heartburn, indigestion, abdominal pain, nausea, vomiting, diarrhea,                 change in bowel habits, loss of appetite GU: dysuria, change in color of urine, no urgency or frequency.   flank pain. MS:   joint pain, stiffness, decreased range of motion, back pain. Neuro-     nothing unusual Psych:  change in mood or affect.  depression or anxiety.   memory loss.  OBJ- Physical Exam General- Alert, Oriented, Affect-appropriate, Distress- none acute, +Power wheelchair Skin- rash-none, lesions- none, excoriation- none Lymphadenopathy- none Head- atraumatic            Eyes- Gross vision intact, PERRLA, conjunctivae and secretions clear            Ears- Hearing, canals-normal            Nose- Clear, no-Septal dev, mucus, polyps, erosion, perforation             Throat- Mallampati IV , mucosa clear , drainage- none, tonsils- atrophic, +teeth Neck- flexible , trachea midline, no stridor , thyroid  nl, carotid no bruit Chest - symmetrical excursion , unlabored           Heart/CV- RRR , no murmur , no gallop  , no rub, nl s1 s2                           - JVD- none , edema- none, stasis changes- none, varices- none           Lung- clear to P&A, wheeze- none, cough- none , dullness-none, rub- none           Chest wall-  Abd- +ileostomy, +suprapubic catheter Br/ Gen/ Rectal- Not done, not indicated Extrem- +lower extremities atrophic Neuro- grossly intact to observation above waist

## 2024-01-16 ENCOUNTER — Ambulatory Visit (INDEPENDENT_AMBULATORY_CARE_PROVIDER_SITE_OTHER): Admitting: Internal Medicine

## 2024-01-16 ENCOUNTER — Encounter: Payer: Self-pay | Admitting: Internal Medicine

## 2024-01-16 VITALS — BP 102/58 | HR 60 | Temp 98.3°F | Ht 60.0 in | Wt 162.0 lb

## 2024-01-16 DIAGNOSIS — Q059 Spina bifida, unspecified: Secondary | ICD-10-CM | POA: Diagnosis not present

## 2024-01-16 DIAGNOSIS — R0683 Snoring: Secondary | ICD-10-CM | POA: Diagnosis not present

## 2024-01-16 DIAGNOSIS — G4734 Idiopathic sleep related nonobstructive alveolar hypoventilation: Secondary | ICD-10-CM | POA: Diagnosis not present

## 2024-01-16 DIAGNOSIS — G8220 Paraplegia, unspecified: Secondary | ICD-10-CM | POA: Diagnosis not present

## 2024-01-16 LAB — URINE CULTURE

## 2024-01-16 NOTE — Patient Instructions (Signed)
 Order- Split night sleep study, in-center necessary, one-on-one staffing, dx Snoring, nocturnal hypoxemia, paraplegic. Mother needs to be present during night for personal assistance.  Please call me about 2 weeks after the test for results and recommendations.

## 2024-01-17 ENCOUNTER — Telehealth (HOSPITAL_BASED_OUTPATIENT_CLINIC_OR_DEPARTMENT_OTHER): Payer: Self-pay | Admitting: *Deleted

## 2024-01-17 NOTE — Progress Notes (Signed)
 ED Antimicrobial Stewardship Positive Culture Follow Up   [name] is an 43 y.o. male who presented to [facility] on 01/14/2024 with a chief complaint of  Chief Complaint  Patient presents with   Hematuria    Recent Results (from the past 720 hours)  Urine Culture     Status: Abnormal   Collection Time: 01/14/24  3:13 PM   Specimen: Urine, Clean Catch  Result Value Ref Range Status   Specimen Description   Final    URINE, CLEAN CATCH Performed at [facility], [address] [place], [place] [address]    Special Requests   Final    NONE Performed at [facility], [address], [place], [place] [address]    Culture 20,000 COLONIES/mL PROTEUS MIRABILIS (A)  Final   Report Status 01/16/2024 FINAL  Final   Organism ID, Bacteria PROTEUS MIRABILIS (A)  Final      Susceptibility   Proteus mirabilis - MIC*    AMPICILLIN  <=2 SENSITIVE Sensitive     CEFAZOLIN  8 SENSITIVE Sensitive     CEFEPIME  <=0.12 SENSITIVE Sensitive     CEFTRIAXONE  <=0.25 SENSITIVE Sensitive     CIPROFLOXACIN <=0.25 SENSITIVE Sensitive     GENTAMICIN  <=1 SENSITIVE Sensitive     IMIPENEM 1 SENSITIVE Sensitive     NITROFURANTOIN  >=512 RESISTANT Resistant     TRIMETH/SULFA <=20 SENSITIVE Sensitive     AMPICILLIN /SULBACTAM <=2 SENSITIVE Sensitive     PIP/TAZO <=4 SENSITIVE Sensitive ug/mL    * 20,000 COLONIES/mL PROTEUS MIRABILIS    [x]  Treated with Cefdinir, which notably has poor urinary penetration   New antibiotic prescription: cefadroxil 500 mg twice daily for 7 days  ED Provider: Mozell Arias, MD  Thank you for allowing pharmacy to be a part of this patient's care.  Garland Junk, PharmD, BCPS, BCIDP Infectious Diseases Clinical Pharmacist 01/17/2024 10:31 AM   **Pharmacist phone directory can now be found on [email or web address] ([email or web address]).  Listed under [facility].

## 2024-01-17 NOTE — Telephone Encounter (Signed)
 Post ED Visit - Positive Culture Follow-up: Unsuccessful Patient Follow-up  Culture assessed and recommendations reviewed by:  []  Court Distance, Pharm.D. []  Skeet Duke, [address].D., BCPS AQ-ID []  Leslee Rase, Pharm.D., BCPS [x]  Garland Junk, Pharm.D., BCPS []  [place], [address].D., BCPS, AAHIVP []  Alcide Aly, Pharm.D., BCPS, AAHIVP []  Alejo Hurter, PharmD []  Thomasine Flick, PharmD, BCPS  Positive urine culture  []  Patient discharged without antimicrobial prescription and treatment is now indicated [x]  Organism is resistant to prescribed ED discharge antimicrobial []  Patient with positive blood cultures  D/C Cefdinir and change to Cefadroxil 500 mg po BID x 7 Days  by Dr Mozell Arias   Unable to contact patient after 3 attempts, letter will be sent to address on file  Jessee Mormon 01/17/2024, 2:08 PM

## 2024-01-26 NOTE — Assessment & Plan Note (Signed)
 Suspect this is associated with hypoventilation, probably obstructive sleep apnea, based on anatomy, witnessed apnea. Plan- sleep study. He will need an in-center study with observer and one-on one staffing.

## 2024-01-26 NOTE — Assessment & Plan Note (Signed)
 Congenital paraplegia. Lower limbs are not weight bearing. Accompanied here  bby mother.

## 2024-02-06 ENCOUNTER — Other Ambulatory Visit: Payer: Self-pay | Admitting: Urology

## 2024-02-15 NOTE — Progress Notes (Signed)
 PCP - Alfreda Imus, MD Cardiologist -   PPM/ICD -  Device Orders -  Rep Notified -   Chest x-ray - 1V 08-15-23 epic EKG - 05-23-23 epic Stress Test -  ECHO -  Cardiac Cath -   Sleep Study -  CPAP -   Fasting Blood Sugar -  Checks Blood Sugar _____ times a day  Blood Thinner Instructions: Aspirin Instructions:  ERAS Protcol - PRE-SURGERY Ensure or G2-    COVID vaccine -  Activity--[facility] dependent  Anesthesia review: Spina Bifida VP shunt runs from brain to heart , Paralysis waist down, HTN , Supra pubic catheter, Ileostomy, seizures  Patient denies shortness of breath, fever, cough and chest pain at PAT appointment   All instructions explained to the patient, with a verbal understanding of the material. Patient agrees to go over the instructions while at home for a better understanding. Patient also instructed to self quarantine after being tested for COVID-19. The opportunity to ask questions was provided.

## 2024-02-15 NOTE — Patient Instructions (Signed)
 SURGICAL WAITING ROOM VISITATION  Patients having surgery or a procedure may have no more than 2 support people in the waiting area - these visitors may rotate.    Children under the age of 55 must have an adult with them who is not the patient.  Visitors with respiratory illnesses are discouraged from visiting and should remain at home.  If the patient needs to stay at the hospital during part of their recovery, the visitor guidelines for inpatient rooms apply. Pre-op nurse will coordinate an appropriate time for 1 support person to accompany patient in pre-op.  This support person may not rotate.    Please refer to the [facility] website for the visitor guidelines for Inpatients (after your surgery is over and you are in a regular room).       Your procedure is scheduled on: 02-19-24   Report to [facility] Main Entrance    Report to admitting at    11:30  AM   Call this number if you have problems the morning of surgery [phone]   Do not eat food :After Midnight.   After Midnight you may have the following liquids until _ 0745_____ AM/  DAY OF SURGERY   then nothing by mouth  Water                                                         Sports drinks like Gatorade (NO RED)                        If you have questions, please contact your surgeon's office.   FOLLOW  ANY ADDITIONAL PRE OP INSTRUCTIONS YOU RECEIVED FROM YOUR SURGEON'S OFFICE!!!     Oral Hygiene is also important to reduce your risk of infection.                                    Remember - BRUSH YOUR TEETH THE MORNING OF SURGERY WITH YOUR REGULAR TOOTHPASTE  DENTURES WILL BE REMOVED PRIOR TO SURGERY PLEASE DO NOT APPLY "Poly grip" OR ADHESIVES!!!   Do NOT smoke after Midnight   Stop all vitamins and herbal supplements 7 days before surgery.   Take these medicines the morning of surgery with A SIP OF WATER : rosuvastatin , keppra , tylenol  if needed, metoprolol , oxybutin,     Bring CPAP mask  and tubing day of surgery.                              You may not have any metal on your body including hair pins, jewelry, and body piercing             Do not wear  lotions, powders, s/cologne, or deodorant              Men may shave face and neck.   Do not bring valuables to the hospital. [facility] IS NOT             RESPONSIBLE   FOR VALUABLES.   Contacts, glasses, dentures or bridgework may not be worn into surgery.   Bring small overnight bag day of surgery.   DO NOT [facility]  MEDICATIONS TO THE HOSPITAL. PHARMACY WILL DISPENSE MEDICATIONS LISTED ON YOUR MEDICATION LIST TO YOU DURING YOUR ADMISSION IN THE HOSPITAL!    Patients discharged on the day of surgery will not be allowed to drive home.  Someone NEEDS to stay with you for the first 24 hours after anesthesia.   Special Instructions: Bring a copy of your healthcare power of attorney and living will documents the day of surgery if you haven't scanned them before.              Please read over the following fact sheets you were given: IF YOU HAVE QUESTIONS ABOUT YOUR PRE-OP INSTRUCTIONS PLEASE CALL [phone]   . If you test positive for Covid or have been in contact with anyone that has tested positive in the last 10 days please notify you surgeon.    [facility] - Preparing for Surgery Before surgery, you can play an important role.  Because skin is not sterile, your skin needs to be as free of germs as possible.  You can reduce the number of germs on your skin by washing with CHG (chlorahexidine gluconate) soap before surgery.  CHG is an antiseptic cleaner which kills germs and bonds with the skin to continue killing germs even after washing. Please DO NOT use if you have an allergy to CHG or antibacterial soaps.  If your skin becomes reddened/irritated stop using the CHG and inform your nurse when you arrive at Short Stay. Do not shave (including legs and underarms) for at least 48 hours prior to the first CHG  shower.  You may shave your face/neck. Please follow these instructions carefully:  1.  Shower with CHG Soap the night before surgery and the  morning of Surgery.  2.  If you choose to wash your hair, wash your hair first as usual with your  normal  shampoo.  3.  After you shampoo, rinse your hair and body thoroughly to remove the  shampoo.                           4.  Use CHG as you would any other liquid soap.  You can apply chg directly  to the skin and wash                       Gently with a scrungie or clean washcloth.  5.  Apply the CHG Soap to your body ONLY FROM THE NECK DOWN.   Do not use on face/ open                           Wound or open sores. Avoid contact with eyes, ears mouth and genitals (private parts).                       Wash face,  Genitals (private parts) with your normal soap.             6.  Wash thoroughly, paying special attention to the area where your surgery  will be performed.  7.  Thoroughly rinse your body with warm water  from the neck down.  8.  DO NOT shower/wash with your normal soap after using and rinsing off  the CHG Soap.                9.  Pat yourself dry with a clean towel.  10.  Wear clean pajamas.            11.  Place clean sheets on your bed the night of your first shower and do not  sleep with pets. Day of Surgery : Do not apply any lotions/deodorants the morning of surgery.  Please wear clean clothes to the hospital/surgery center.  FAILURE TO FOLLOW THESE INSTRUCTIONS MAY RESULT IN THE CANCELLATION OF YOUR SURGERY PATIENT SIGNATURE_________________________________  NURSE SIGNATURE__________________________________  ________________________________________________________________________

## 2024-02-16 ENCOUNTER — Encounter (HOSPITAL_COMMUNITY): Payer: Self-pay

## 2024-02-16 ENCOUNTER — Encounter (HOSPITAL_COMMUNITY)
Admission: RE | Admit: 2024-02-16 | Discharge: 2024-02-16 | Disposition: A | Discharge status: Home / Self Care | Source: Ambulatory Visit | Attending: Urology | Admitting: Urology

## 2024-02-16 ENCOUNTER — Other Ambulatory Visit: Payer: Self-pay

## 2024-02-16 VITALS — BP 120/79 | HR 73 | Temp 97.5°F | Resp 16 | Ht 60.0 in | Wt 162.0 lb

## 2024-02-16 DIAGNOSIS — I10 Essential (primary) hypertension: Secondary | ICD-10-CM | POA: Insufficient documentation

## 2024-02-16 DIAGNOSIS — Z01812 Encounter for preprocedural laboratory examination: Secondary | ICD-10-CM | POA: Insufficient documentation

## 2024-02-16 LAB — CBC
HCT: 45.3 % (ref 39.0–52.0)
Hemoglobin: 13.9 g/dL (ref 13.0–17.0)
MCH: 25.5 pg — ABNORMAL LOW (ref 26.0–34.0)
MCHC: 30.7 g/dL (ref 30.0–36.0)
MCV: 83.1 fL (ref 80.0–100.0)
Platelets: 196 10*3/uL (ref 150–400)
RBC: 5.45 MIL/uL (ref 4.22–5.81)
RDW: 13.3 % (ref 11.5–15.5)
WBC: 8.2 10*3/uL (ref 4.0–10.5)
nRBC: 0 % (ref 0.0–0.2)

## 2024-02-16 LAB — BASIC METABOLIC PANEL WITH GFR
Anion gap: 9 (ref 5–15)
BUN: 18 mg/dL (ref 6–20)
CO2: 25 mmol/L (ref 22–32)
Calcium: 8.9 mg/dL (ref 8.9–10.3)
Chloride: 105 mmol/L (ref 98–111)
Creatinine, Ser: 0.38 mg/dL — ABNORMAL LOW (ref 0.61–1.24)
GFR, Estimated: >60 mL/min (ref >60)
Glucose, Bld: 78 mg/dL (ref 70–99)
Potassium: 4.3 mmol/L (ref 3.5–5.1)
Sodium: 139 mmol/L (ref 135–145)

## 2024-02-19 ENCOUNTER — Ambulatory Visit (HOSPITAL_COMMUNITY): Admitting: Certified Registered Nurse Anesthetist

## 2024-02-19 ENCOUNTER — Encounter (HOSPITAL_COMMUNITY)
Admission: RE | Disposition: A | Discharge status: Home / Self Care | Payer: Self-pay | Source: Home / Self Care | Attending: Urology

## 2024-02-19 ENCOUNTER — Other Ambulatory Visit: Payer: Self-pay

## 2024-02-19 ENCOUNTER — Ambulatory Visit (HOSPITAL_COMMUNITY)
Admission: RE | Admit: 2024-02-19 | Discharge: 2024-02-19 | Disposition: A | Discharge status: Home / Self Care | Attending: Urology | Admitting: Urology

## 2024-02-19 ENCOUNTER — Ambulatory Visit (HOSPITAL_COMMUNITY): Payer: Self-pay | Admitting: Physician Assistant

## 2024-02-19 ENCOUNTER — Encounter (HOSPITAL_COMMUNITY): Payer: Self-pay | Admitting: Urology

## 2024-02-19 DIAGNOSIS — D631 Anemia in chronic kidney disease: Secondary | ICD-10-CM | POA: Diagnosis not present

## 2024-02-19 DIAGNOSIS — Z9359 Other cystostomy status: Secondary | ICD-10-CM | POA: Insufficient documentation

## 2024-02-19 DIAGNOSIS — N189 Chronic kidney disease, unspecified: Secondary | ICD-10-CM

## 2024-02-19 DIAGNOSIS — A419 Sepsis, unspecified organism: Secondary | ICD-10-CM

## 2024-02-19 DIAGNOSIS — Q052 Lumbar spina bifida with hydrocephalus: Secondary | ICD-10-CM | POA: Diagnosis not present

## 2024-02-19 DIAGNOSIS — N319 Neuromuscular dysfunction of bladder, unspecified: Secondary | ICD-10-CM | POA: Diagnosis not present

## 2024-02-19 DIAGNOSIS — R569 Unspecified convulsions: Secondary | ICD-10-CM | POA: Insufficient documentation

## 2024-02-19 DIAGNOSIS — I10 Essential (primary) hypertension: Secondary | ICD-10-CM | POA: Insufficient documentation

## 2024-02-19 DIAGNOSIS — N310 Uninhibited neuropathic bladder, not elsewhere classified: Secondary | ICD-10-CM | POA: Diagnosis present

## 2024-02-19 HISTORY — PX: CYSTOSCOPY WITH INJECTION: SHX1424

## 2024-02-19 SURGERY — CYSTOSCOPY, WITH INJECTION OF BLADDER NECK OR BLADDER WALL
Anesthesia: Monitor Anesthesia Care | Site: Bladder

## 2024-02-19 MED ORDER — STERILE WATER FOR IRRIGATION IR SOLN
Status: DC | PRN
  Administered 2024-02-19: 3000 mL

## 2024-02-19 MED ORDER — SODIUM CHLORIDE (PF) 0.9 % IJ SOLN
INTRAMUSCULAR | Status: AC
  Filled 2024-02-19: qty 50

## 2024-02-19 MED ORDER — OXYCODONE HCL 5 MG PO TABS: 5.0000 mg | ORAL_TABLET | Freq: Once | ORAL | Status: DC | PRN

## 2024-02-19 MED ORDER — PROPOFOL 500 MG/50ML IV EMUL
INTRAVENOUS | Status: DC | PRN
  Administered 2024-02-19: 50 ug/kg/min via INTRAVENOUS

## 2024-02-19 MED ORDER — ONDANSETRON HCL 4 MG/2ML IJ SOLN
INTRAMUSCULAR | Status: DC | PRN
  Administered 2024-02-19: 4 mg via INTRAVENOUS

## 2024-02-19 MED ORDER — HYDROMORPHONE HCL 1 MG/ML IJ SOLN: 0.2500 mg | INTRAMUSCULAR | Status: DC | PRN

## 2024-02-19 MED ORDER — ONABOTULINUMTOXINA 100 UNITS IJ SOLR
INTRAMUSCULAR | Status: DC | PRN
  Administered 2024-02-19: 200 [IU] via INTRAMUSCULAR

## 2024-02-19 MED ORDER — SODIUM CHLORIDE 0.9 % IV SOLN: 12.5000 mg | INTRAVENOUS | Status: DC | PRN

## 2024-02-19 MED ORDER — PROPOFOL 10 MG/ML IV BOLUS
INTRAVENOUS | Status: DC | PRN
  Administered 2024-02-19 (×2): 20 mg via INTRAVENOUS

## 2024-02-19 MED ORDER — ORAL CARE MOUTH RINSE: 15.0000 mL | Freq: Once | OROMUCOSAL | Status: AC

## 2024-02-19 MED ORDER — SODIUM CHLORIDE 0.9 % IV SOLN
1.0000 g | INTRAVENOUS | Status: AC
  Administered 2024-02-19: 1 g via INTRAVENOUS
  Filled 2024-02-19: qty 20

## 2024-02-19 MED ORDER — PHENYLEPHRINE HCL-NACL 20-0.9 MG/250ML-% IV SOLN
INTRAVENOUS | Status: AC
  Filled 2024-02-19: qty 250

## 2024-02-19 MED ORDER — SODIUM CHLORIDE (PF) 0.9 % IJ SOLN
INTRAMUSCULAR | Status: AC
  Filled 2024-02-19: qty 10

## 2024-02-19 MED ORDER — MIDAZOLAM HCL 5 MG/5ML IJ SOLN
INTRAMUSCULAR | Status: DC | PRN
  Administered 2024-02-19: 2 mg via INTRAVENOUS

## 2024-02-19 MED ORDER — CHLORHEXIDINE GLUCONATE 0.12 % MT SOLN
15.0000 mL | Freq: Once | OROMUCOSAL | Status: AC
  Administered 2024-02-19: 15 mL via OROMUCOSAL

## 2024-02-19 MED ORDER — ONABOTULINUMTOXINA 100 UNITS IJ SOLR
INTRAMUSCULAR | Status: AC
  Filled 2024-02-19: qty 200

## 2024-02-19 MED ORDER — LIDOCAINE HCL (CARDIAC) PF 100 MG/5ML IV SOSY
PREFILLED_SYRINGE | INTRAVENOUS | Status: DC | PRN
Start: 2024-02-19 — End: 2024-02-19
  Administered 2024-02-19: 100 mg via INTRAVENOUS

## 2024-02-19 MED ORDER — OXYCODONE HCL 5 MG/5ML PO SOLN: 5.0000 mg | Freq: Once | ORAL | Status: DC | PRN

## 2024-02-19 MED ORDER — MIDAZOLAM HCL 2 MG/2ML IJ SOLN
INTRAMUSCULAR | Status: AC
  Filled 2024-02-19: qty 2

## 2024-02-19 MED ORDER — LACTATED RINGERS IV SOLN: INTRAVENOUS | Status: DC

## 2024-02-19 MED ORDER — LIDOCAINE HCL (PF) 2 % IJ SOLN
INTRAMUSCULAR | Status: AC
  Filled 2024-02-19: qty 5

## 2024-02-19 MED ORDER — ONDANSETRON HCL 4 MG/2ML IJ SOLN
INTRAMUSCULAR | Status: AC
  Filled 2024-02-19: qty 2

## 2024-02-19 SURGICAL SUPPLY — 13 items
BAG URO CATCHER STRL LF (MISCELLANEOUS) ×1 IMPLANT
CLOTH BEACON ORANGE TIMEOUT ST (SAFETY) ×1 IMPLANT
GLOVE BIO SURGEON STRL SZ7.5 (GLOVE) ×1 IMPLANT
GOWN STRL REUS W/ TWL XL LVL3 (GOWN DISPOSABLE) ×1 IMPLANT
KIT TURNOVER KIT A (KITS) IMPLANT
MANIFOLD NEPTUNE II (INSTRUMENTS) ×1 IMPLANT
NDL ASPIRATION 22 (NEEDLE) ×1 IMPLANT
NDL SAFETY ECLIPSE 18X1.5 (NEEDLE) IMPLANT
NEEDLE ASPIRATION 22 (NEEDLE) ×1 IMPLANT
PACK CYSTO (CUSTOM PROCEDURE TRAY) ×1 IMPLANT
SYR CONTROL 10ML LL (SYRINGE) IMPLANT
TUBING CONNECTING 10 (TUBING) IMPLANT
WATER STERILE IRR 3000ML UROMA (IV SOLUTION) ×1 IMPLANT

## 2024-02-19 NOTE — Op Note (Signed)
 Operative Note   Preoperative diagnosis:  1.  Neurogenic bladder   Postoperative diagnosis: 1.  Neurogenic bladder   Procedure(s): 1.  Cystoscopy with 200 units of Botox  2.  Suprapubic tube exchange   Surgeon: Leila Punt, MD   Assistants: None   Anesthesia: General   Complications: None immediate   EBL: Normal   Specimens: 1.  None   Drains/Catheters: 1.  22 French suprapubic tube   Intraoperative findings: 1.  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  Bladder mucosa was without any tumors or stones.  Small capacity bladder.   Indication: 43 year old male with neurogenic bladder managed with suprapubic tube and intra detrusor Botox  presents for the previously mentioned operation.   Description of procedure:   The patient was identified and consent was obtained.  The patient was taken to the operating room and placed in the supine position.  The patient was placed under general anesthesia.  Perioperative antibiotics were administered.  The patient was placed in dorsal lithotomy.  Patient was prepped and draped in a standard sterile fashion and a timeout was performed.   A rigid cystoscope was advanced into the urethra and into the bladder.  Complete cystoscopy was performed with findings noted above.  There were no stones so I withdrew the scope and advanced the Botox  cystoscope into the bladder and proceeded to inject 200 units of Botox  throughout the detrusor muscle systematically.  There was no significant active bleeding noted.  I placed a 22 French suprapubic tube in place 10 cc of sterile water  into the catheter balloon.  It was in good position and I withdrew the scope.  Patient tolerated the procedure well and was stable postoperatively.   Plan: Follow-up in 6 months for repeat Botox .  Continue monthly suprapubic tube exchanges.

## 2024-02-19 NOTE — Transfer of Care (Signed)
 Immediate Anesthesia Transfer of Care Note  Patient: [name]  Procedure(s) Performed: CYSTOSCOPY, WITH INJECTION OF BLADDER NECK OR BLADDER WALL (Bladder)  Patient Location: PACU  Anesthesia Type:MAC  Level of Consciousness: awake, alert , and oriented  Airway & Oxygen  Therapy: Patient Spontanous Breathing and Patient connected to face mask oxygen   Post-op Assessment: Report given to RN and Post -op Vital signs reviewed and stable  Post vital signs: Reviewed and stable  Last Vitals:  Vitals Value Taken Time  BP 114/70 02/19/24 1406  Temp    Pulse 73 02/19/24 1408  Resp 22 02/19/24 1408  SpO2 100 % 02/19/24 1408  Vitals shown include unfiled device data.  Last Pain:  Vitals:   02/19/24 1113  TempSrc: Oral  PainSc:          Complications: No notable events documented.

## 2024-02-19 NOTE — H&P (Signed)
 H&P Chief Complaint: Neurogenic bladder  History of Present Illness: 43 year old male with a history of neurogenic bladder managed with suprapubic tube presents for cystoscopy with 200 units of Botox .  Past Medical History:  Diagnosis Date   Arnold-Chiari malformation (HCC)    Congenital paraplegia (HCC)    MID-ABDOMIN DOWN  S/P SPINA BIFIDA   GERD (gastroesophageal reflux disease)    Heart palpitations    SECONDARY TO VP SHUNT DRAIN   History of kidney stones    History of seizure    per pt mother, no seizures since age 74   Hypertension    Ileostomy in place [facility]) 2014   Migraines followed by neurologist @[facility]    taking verapamil    Neurogenic bladder    INCONTINENT -   Neurogenic bowel    S/P VP shunt    Spina bifida with hydrocephalus, lumbar region (HCC)    CONGENITAL--   VP SHUNT in place--  PARALYZIED MID ABDOMEN DOWN   Urinary incontinence with continuous leakage    Wears glasses    Wheelchair bound    CAN TRASFER SELF WITHOUT BOARD   Past Surgical History:  Procedure Laterality Date   BACK SURGERY  infant   "get skin to stretch over his back; spinal bifida"   BACK SURGERY  age 58   scoliosis and  removed hump on his back (kyphosis) and put rods in to stabalize back"   BOTOX  INJECTION N/A 11/14/2018   Procedure: BOTOX  INJECTION CYSTOSCOPY;  Surgeon: Samson Croak, MD;  Location: [facility];  Service: Urology;  Laterality: N/A;   BOTOX  INJECTION N/A 05/22/2019   Procedure: BOTOX  INJECTION;  Surgeon: Samson Croak, MD;  Location: [facility];  Service: Urology;  Laterality: N/A;   BOTOX  INJECTION N/A 11/13/2019   Procedure: CYSTOSCOPY BOTOX  INJECTION;  Surgeon: Samson Croak, MD;  Location: [facility];  Service: Urology;  Laterality: N/A;   BOTOX  INJECTION N/A 07/01/2020   Procedure: CYSTOSCOPY, BOTOX  INJECTION, BLADDER FULGERATION, SUPRAPUBIC TUBE EXCHANGE;  Surgeon: Adelbert Homans, MD;  Location: [facility];  Service: Urology;  Laterality: N/A;   BOTOX  INJECTION  N/A 12/14/2020   Procedure: BOTOX  INJECTION 300 UNITS SUPRAPUBIC TUBE CHANGE;  Surgeon: Samson Croak, MD;  Location: [facility];  Service: Urology;  Laterality: N/A;   BOTOX  INJECTION N/A 06/02/2023   Procedure: CYSTOSCOPY BOTOX  INJECTION 200 UNITS;  Surgeon: Samson Croak, MD;  Location: [facility];  Service: Urology;  Laterality: N/A;  1 HR FOR CASE   CHOLECYSTECTOMY N/A 07/06/2013   Procedure: LAPAROSCOPIC CHOLECYSTECTOMY WITH INTRAOPERATIVE CHOLANGIOGRAM;  Surgeon: Evander Hills, DO;  Location: [facility];  Service: General;  Laterality: N/A;   COLON RESECTION N/A 07/08/2013   Procedure: EXPLORATORY LAPAROSCOPY, DIAGNOSTIC LAPAROSCOPY, PARTIAL COLECTOMY, ABDOMINAL WASHOUT, ABDOMINAL WOUND VAC;  Surgeon: Evander Hills, DO;  Location: [facility];  Service: General;  Laterality: N/A;   CYSTOSCOPY WITH LITHOLAPAXY N/A 05/09/2022   Procedure: CYSTOSCOPY WITH LITHOLAPAXY, BOTOX  INJECTION;  Surgeon: Samson Croak, MD;  Location: [facility];  Service: Urology;  Laterality: N/A;   HIP SURGERY Bilateral AS CHILD   TENDON RELEASE   HOLMIUM LASER APPLICATION N/A 07/01/2020   Procedure: HOLMIUM LASER OF BLADDER STONE;  Surgeon: Adelbert Homans, MD;  Location: [facility];  Service: Urology;  Laterality: N/A;   INCISION AND DRAINAGE OF WOUND N/A 11/04/2013   Procedure: IRRIGATION AND DEBRIDEMENT ABDOMINAL WOUND WITH PLACEMENT OF ACELL/VAC;  Surgeon: Marilou Showman, DO;  Location: [facility];  Service: Government social research officer;  Laterality: N/A;   INSERTION OF SUPRAPUBIC CATHETER N/A 12/23/2019   Procedure: FLEXIBLE CYSTOSCOPE WITH FOLEY PLACEMENT OVER WIRE.;  Surgeon: Roxane Copp, MD;  Location: [facility];  Service: Urology;  Laterality: N/A;   INSERTION OF SUPRAPUBIC CATHETER N/A 07/26/2021   Procedure: EXCHANGE SUPRAPUBIC CATHETER AMD BOTOX  INJECTION 300 UNITS;  Surgeon: Samson Croak, MD;  Location: [facility];  Service: Urology;  Laterality: N/A;   INSERTION OF SUPRAPUBIC CATHETER N/A 06/02/2023    Procedure: SUPRAPUBIC CATHETER CHANGE;  Surgeon: Samson Croak, MD;  Location: [facility];  Service: Urology;  Laterality: N/A;   IR CATHETER TUBE CHANGE  02/15/2019   IR CATHETER TUBE CHANGE  03/01/2019   IR CATHETER TUBE CHANGE  05/06/2019   IR CATHETER TUBE CHANGE  03/06/2020   IR CATHETER TUBE CHANGE  04/16/2020   IR CATHETER TUBE CHANGE  04/29/2020   ORCHIOPEXY Bilateral AS CHILD   UNDESCENDED TESTIS   RIGHT COLECTOMY/  APPENDECTOMY/  ILEOSTOMY  NOV 2014  [facility]   surgical complication with cholecystectomy   UMBILICAL HERNIA REPAIR  child   VENTRICULOPERITONEAL SHUNT  11-12-2018 per pt and mother---LAST REVISION ~ 06/2015   DUE TO ABD WOUND--- SHUNT DRAINS TO HEART (12/23/2015)    Home Medications:  Medications Prior to Admission  Medication Sig Dispense Refill Last Dose/Taking   acetaminophen  (TYLENOL ) 650 MG CR tablet Take 1,300 mg by mouth every 8 (eight) hours as needed for pain.   Taking As Needed   Ascorbic Acid (VITAMIN C ) 1000 MG tablet Take 1,000 mg by mouth daily with lunch.   Past Week   calcium  elemental as carbonate (BARIATRIC TUMS ULTRA) 400 MG chewable tablet Chew 2,000 mg by mouth 3 (three) times daily after meals.   Past Week   Cranberry-Vitamin C -Vitamin E 4200-20-3 MG-MG-UNIT CAPS Take 2 capsules by mouth in the morning, at noon, and at bedtime.   Past Week   KLOR-CON  M20 20 MEQ tablet Take 20 mEq by mouth daily.   Taking   levETIRAcetam  (KEPPRA ) 500 MG tablet Take 500 mg by mouth 2 (two) times daily.   02/18/2024   liver oil-zinc oxide (DESITIN) 40 % ointment Apply 1 Application topically as needed for irritation.   Past Week   metoprolol  tartrate (LOPRESSOR ) 50 MG tablet Take 50 mg by mouth 2 (two) times daily.   02/18/2024   Multiple Minerals-Vitamins (CALCIUM -MAGNESIUM -ZINC-D3) TABS Take 1 tablet by mouth in the morning and at bedtime.   Past Week   Multiple Vitamin (MULTIVITAMIN WITH MINERALS) TABS tablet Take 1 tablet by mouth daily in the afternoon.   Past Week    nystatin cream (MYCOSTATIN) Apply 1 Application topically daily as needed for dry skin.   Past Week   ondansetron  (ZOFRAN ) 4 MG tablet Take 4 mg by mouth every 8 (eight) hours as needed for nausea or vomiting.   Taking As Needed   oxybutynin  (DITROPAN ) 5 MG tablet Take 5 mg by mouth 2 (two) times daily.   02/18/2024   polyethylene glycol (MIRALAX  / GLYCOLAX ) 17 g packet Take 17 g by mouth daily. 30 each 0 Past Week   rosuvastatin  (CRESTOR ) 20 MG tablet Take 20 mg by mouth in the morning.   02/18/2024   Allergies:  Allergies  Allergen Reactions   Latex Hives   Succinylcholine  Rash and Other (See Comments)    Pt is half [place] - prolonged paralysis due to compromised metabolism. Succinylcholine  given on 07/07/15 at outside hospital and pt  experienced paralysis > 3 hours.   Due to being native [place], his mother states that he takes longer to "come out of it  Pt is half [place] - prolonged paralysis due to compromised metabolism. Succinylcholine  given on 07/07/15 at outside hospital and pt experienced paralysis > 3 hours.   Ciprofloxacin Other (See Comments)    IV only-- caused burning in arm    Vancomycin Itching and Swelling   Adhesive [Tape] Hives and Rash   Wound Dressing Adhesive Hives and Rash    Family History  Problem Relation Age of Onset   Diabetes Other    Hypertension Other    Cancer Other    Stroke Other    Social History:  reports that he has never smoked. He has never been exposed to tobacco smoke. He has never used smokeless tobacco. He reports that he does not drink alcohol and does not use drugs.  ROS: A complete review of systems was performed.  All systems are negative except for pertinent findings as noted. ROS   Physical Exam:  Vital signs in last 24 hours: Temp:  [98.6 F (37 C)] 98.6 F (37 C) (06/09 1113) Pulse Rate:  [86] 86 (06/09 1113) Resp:  [18] 18 (06/09 1113) BP: (108)/(70) 108/70 (06/09 1113) SpO2:  [100 %] 100 % (06/09  1113) Weight:  [73.5 kg] 73.5 kg (06/09 1111) General:  Alert and oriented, No acute distress HEENT: Normocephalic, atraumatic Neck: No JVD or lymphadenopathy Cardiovascular: Regular rate and rhythm Lungs: Regular rate and effort Abdomen: Soft, nontender, nondistended, no abdominal masses, ostomy in place, suprapubic tube in place draining clear yellow urine Back: No CVA tenderness   Laboratory Data:  No results found for this or any previous visit (from the past 24 hours). No results found for this or any previous visit (from the past 240 hours). Creatinine: Recent Labs    02/16/24 0830  CREATININE 0.38*    Impression/Assessment:  Neurogenic bladder  Plan:  Proceed with cystoscopy with 200 units of intradetrusor Botox .  Will exchange the suprapubic tube.  Maralyn Sender, III 02/19/2024, 1:09 PM

## 2024-02-19 NOTE — Anesthesia Procedure Notes (Signed)
 Procedure Name: MAC Date/Time: 02/19/2024 1:28 PM  Performed by: Elvia Hammans, CRNAPre-anesthesia Checklist: Patient identified, Emergency Drugs available, Suction available, Patient being monitored and Timeout performed Patient Re-evaluated:Patient Re-evaluated prior to induction Oxygen  Delivery Method: Simple face mask Placement Confirmation: positive ETCO2

## 2024-02-19 NOTE — Anesthesia Preprocedure Evaluation (Signed)
Anesthesia Evaluation  Patient identified by MRN, date of birth, ID band Patient awake    Reviewed: Allergy & Precautions, H&P , NPO status , Patient's Chart, lab work & pertinent test results, reviewed documented beta blocker date and time   History of Anesthesia Complications Negative for: history of anesthetic complications  Airway Mallampati: III  TM Distance: >3 FB Neck ROM: Full    Dental  (+) Poor Dentition, Chipped, Missing   Pulmonary neg pulmonary ROS   Pulmonary exam normal        Cardiovascular hypertension, Pt. on home beta blockers and Pt. on medications Normal cardiovascular exam     Neuro/Psych  Headaches, Seizures - (none since age 43), Well Controlled,   Spina bifida Paraplegic    negative psych ROS   GI/Hepatic Neg liver ROS,GERD  Controlled,,  Endo/Other  negative endocrine ROS    Renal/GU Renal disease    Neurogenic bladder  negative genitourinary   Musculoskeletal negative musculoskeletal ROS (+)    Abdominal   Peds negative pediatric ROS (+)  Hematology  (+) Blood dyscrasia, anemia   Anesthesia Other Findings Hx prolonged paralysis following succinylcholine   Reproductive/Obstetrics negative OB ROS                              Anesthesia Physical Anesthesia Plan  ASA: 3  Anesthesia Plan: MAC   Post-op Pain Management:    Induction: Intravenous  PONV Risk Score and Plan: 1 and Propofol infusion and Treatment may vary due to age or medical condition  Airway Management Planned: Natural Airway, Simple Face Mask and Nasal Cannula  Additional Equipment: None  Intra-op Plan:   Post-operative Plan:   Informed Consent: I have reviewed the patients History and Physical, chart, labs and discussed the procedure including the risks, benefits and alternatives for the proposed anesthesia with the patient or authorized representative who has indicated his/her  understanding and acceptance.     Dental advisory given  Plan Discussed with: Anesthesiologist and CRNA  Anesthesia Plan Comments:          Anesthesia Quick Evaluation

## 2024-02-19 NOTE — Anesthesia Postprocedure Evaluation (Signed)
 Anesthesia Post Note  Patient: [name]  Procedure(s) Performed: CYSTOSCOPY, WITH INJECTION OF BLADDER NECK OR BLADDER WALL (Bladder)     Patient location during evaluation: PACU Anesthesia Type: MAC Level of consciousness: awake and alert Pain management: pain level controlled Vital Signs Assessment: post-procedure vital signs reviewed and stable Respiratory status: spontaneous breathing, nonlabored ventilation and respiratory function stable Cardiovascular status: blood pressure returned to baseline and stable Postop Assessment: no apparent nausea or vomiting Anesthetic complications: no   No notable events documented.  Last Vitals:  Vitals:   02/19/24 1500 02/19/24 1505  BP: 123/73 124/83  Pulse: 78 87  Resp: 18 18  Temp: 36.5 C 36.5 C  SpO2: 100% 99%    Last Pain:  Vitals:   02/19/24 1506  TempSrc:   PainSc: 0-No pain                 Earvin Goldberg

## 2024-02-20 ENCOUNTER — Encounter (HOSPITAL_COMMUNITY): Payer: Self-pay | Admitting: Urology

## 2024-03-24 NOTE — Progress Notes (Deleted)
 01/16/24- 43 yoM for sleep evaluation courtesy of Dr Rayna Hemming with concern of OSA Medical problem list includes Nocturnal Hypoxemia, HTN, Arnold Chiari / Spina Bifida, Congenital Paraplegia, Suprapubic Catheter,  VP Shunt, Ileostomy, CKD, Mother here.  Discussed the use of AI scribe software for clinical note transcription with the patient, who gave verbal consent to proceed. History of Present Illness   [name] is a 43 year old male who presents with nocturnal hypoxemia. He was referred by Dr. Palawal for evaluation of low oxygen  levels during sleep.  He experiences nocturnal oxygen  desaturation, first identified during hospital stays in January and March, where supplemental oxygen  was required. Desaturation was detected by a machine alarm at night. Mother confirms snoring and probable apneas at night, and there is no family history of sleep apnea. He has not had surgeries involving the nose or throat. A VP shunt is in place, but there are no known heart or lung conditions.  He does not use medications for sleep or alertness and primarily consumes water . Daytime sleepiness occurs only when unwell. He naps for 30 minutes to an hour, sometimes twice daily, usually postprandially.   Mother helps manage his suprapubic catheter and ileostomy.    In-center study needed due to medical complexity.  03/26/24-  In-center sleep study scheduled at last OV in May- not done.  ROS-see HPI   + = positive Constitutional:    weight loss, night sweats, fevers, chills, fatigue, lassitude. HEENT:    headaches, difficulty swallowing, tooth/dental problems, sore throat,       sneezing, itching, ear ache, nasal congestion, post nasal drip, snoring CV:    chest pain, orthopnea, PND, swelling in lower extremities, anasarca,                                   dizziness, palpitations Resp:   shortness of breath with exertion or at rest.                productive cough,   non-productive cough, coughing up  of blood.              change in color of mucus.  wheezing.   Skin:    rash or lesions. GI:  No-   heartburn, indigestion, abdominal pain, nausea, vomiting, diarrhea,                 change in bowel habits, loss of appetite GU: dysuria, change in color of urine, no urgency or frequency.   flank pain. MS:   joint pain, stiffness, decreased range of motion, back pain. Neuro-     nothing unusual Psych:  change in mood or affect.  depression or anxiety.   memory loss.  OBJ- Physical Exam General- Alert, Oriented, Affect-appropriate, Distress- none acute, +Power wheelchair Skin- rash-none, lesions- none, excoriation- none Lymphadenopathy- none Head- atraumatic            Eyes- Gross vision intact, PERRLA, conjunctivae and secretions clear            Ears- Hearing, canals-normal            Nose- Clear, no-Septal dev, mucus, polyps, erosion, perforation             Throat- Mallampati IV , mucosa clear , drainage- none, tonsils- atrophic, +teeth Neck- flexible , trachea midline, no stridor , thyroid  nl, carotid no bruit Chest - symmetrical excursion , unlabored  Heart/CV- RRR , no murmur , no gallop  , no rub, nl s1 s2                           - JVD- none , edema- none, stasis changes- none, varices- none           Lung- clear to P&A, wheeze- none, cough- none , dullness-none, rub- none           Chest wall-  Abd- +ileostomy, +suprapubic catheter Br/ Gen/ Rectal- Not done, not indicated Extrem- +lower extremities atrophic Neuro- grossly intact to observation above waist

## 2024-03-25 ENCOUNTER — Ambulatory Visit (HOSPITAL_BASED_OUTPATIENT_CLINIC_OR_DEPARTMENT_OTHER): Attending: Internal Medicine | Admitting: Internal Medicine

## 2024-03-25 DIAGNOSIS — G4734 Idiopathic sleep related nonobstructive alveolar hypoventilation: Secondary | ICD-10-CM | POA: Diagnosis present

## 2024-03-25 DIAGNOSIS — G4733 Obstructive sleep apnea (adult) (pediatric): Secondary | ICD-10-CM | POA: Insufficient documentation

## 2024-03-25 DIAGNOSIS — G8220 Paraplegia, unspecified: Secondary | ICD-10-CM | POA: Diagnosis not present

## 2024-03-25 DIAGNOSIS — R0683 Snoring: Secondary | ICD-10-CM

## 2024-03-26 ENCOUNTER — Ambulatory Visit: Admitting: Internal Medicine

## 2024-03-31 NOTE — Procedures (Signed)
 [facility] [address] [place], [place] [address] Tel: [phone]   Fax: [phone]  Split Night Interpretation  Patient Name:  [name], [name] Study Date:  03/25/2024 Referring Physician:  REGGY SALT [phone])  Indications for Polysomnography The patient is a 43 year old Male who is 5' and weighs 162.0 lbs.  His BMI equals 31.8.  A diagnostic polysomnogram was performed to evaluate for -OSA.  After 126.0 minutes of sleep time the patient exhibited sufficient respiratory events qualifying him for a CPAP trial which was then initiated.    Medications taken at 1946:  OXYBUTYNIN   KEPPRA    Polysomnogram Data A full night polysomnogram was performed recording the standard physiologic parameters including EEG, EOG, EMG, EKG, nasal and oral airflow.  Respiratory parameters of chest and abdominal movements are recorded with Piezo-Crystal motion transducers.  Oxygen  saturation was recorded by pulse oximetry.    Sleep Architecture The total recording time of the diagnostic portion of the study was 244.8 minutes.  The total sleep time was 126.0 minutes.  During the diagnostic portion of the study, the patient spent 13.5% of total sleep time in Stage N1, 53.6% in Stage N2, 31.0% in Stages N3, and 2.0% in REM.   Sleep latency was 53.3 minutes.  REM latency was 146.0 minutes.  Sleep Efficiency was 51.5%.  Wake after Sleep Onset time was 65.5 minutes.   At 01:25:33 AM the patient was placed on PAP treatment and was titrated at pressures ranging from 6/2* cm/H20 with supplemental oxygen  at - up to 21/17/0** cm/H20 with supplemental oxygen  at -.  The total recording time of the treatment portion of the study was 236.0 minutes.  The total sleep time was 144.5 minutes.  During the treatment portion of the study, the patient spent 6.6% of total sleep time in Stage N1, 72.3% in Stage N2, 6.9% in Stages N3, and 14.2% in REM.   Sleep latency was 12.5 minutes.  REM  latency was 26.0 minutes.  Sleep Efficiency was 61.2%.  Wake after Sleep Onset time was 79.0 minutes.  Respiratory Events During the diagnostic portion of the study, the polysomnogram revealed a presence of 31 obstructive, - central, and 1 mixed apnea resulting in an Apnea index of 15.2 events per hour.  There were 54 hypopneas (>=3% desaturation and/or arousal) resulting in an Apnea\Hypopnea Index (AHI >=3% desaturation and/or arousal) of 41.0 events per hour.  There were 16 hypopneas (>=4% desaturation) resulting in an Apnea\Hypopnea Index (AHI >=4% desaturation) of 22.9 events per hour.  There were 64 Respiratory Effort Related Arousals resulting in a RERA index of 30.5 events per hour. The Respiratory Disturbance Index is 71.4 events per hour.  The snore index was - events per hour.  Mean oxygen  saturation was 96.2%.  The lowest oxygen  saturation during sleep was 59.0%.  Time spent <=88% oxygen  saturation was 6.1 minutes (2.5%).  During the treatment portion of the study, the polysomnogram revealed a presence of 43 obstructive, 1 central, and - mixed apneas resulting in an Apnea index of 18.3 events per hour.  There were 36 hypopneas (>=3% desaturation and/or arousal) resulting in an Apnea\Hypopnea Index (AHI >=3% desaturation and/or arousal) of 33.2 events per hour.  There were 26 hypopneas (>=4% desaturation) resulting in an Apnea\Hypopnea Index (AHI >=4% desaturation) of 29.1 events per hour.  There were 33 Respiratory Effort Related Arousals resulting in a RERA index of 13.7 events per hour. The Respiratory Disturbance Index is 46.9 events per hour.  The snore index was - events per hour.  Mean oxygen  saturation was 94.0%.  The lowest oxygen  saturation during sleep was 68.0%.  Time spent <=88% oxygen  saturation was 25.6 minutes (10.9%).  Limb Activity During the diagnostic portion of the study, there were - limb movements recorded.  Of this total, - were classified as PLMs.  Of the PLMs, - were  associated with arousals.  The Limb Movement index was - per hour while the PLM index was - per hour.  During the treatment portion of the study, there were - limb movements recorded.  Of this total, - were classified as PLMs.  Of the PLMs, - were associated with arousals.  The Limb Movement index was - per hour while the PLM index was - per hour.  Cardiac Summary During the diagnostic portion of the study, the average pulse rate was 73.3 bpm.  The minimum pulse rate was 58.0 bpm while the maximum pulse rate was 99.0 bpm.  During the treatment portion of the study, the average pulse rate was 81.8 bpm.  The minimum pulse rate was 58.0 bpm while the maximum pulse rate was 104.0 bpm.   Comment: Severe obstructive sleep apnea, AHI (3%) 41/hr. Snoring with oxygen  desaturation to a nadir of 59%, mean 96.2%. CPAP titration did not provide adequate control at 20 cwp and was changed to bilevel/ BIPAP. Final BIPAP21/ 7. PS 0, residual AHI(3%) 3.1/hr, minimum O2 saturation 92%, mean 95.4%.  Diagnosis: Obstructive sleep apnea  Recommendations: BIPAP 21/7, PS 0. Patient wore a medium ResMed AirFit F20 full face mask with heated humidification.   This study was personally reviewed and electronically signed by: NEYSA REGGY BIRCH., MD Accredited Board Certified in Sleep Medicine Date/Time: 03/31/24 11:35   Split Night Report  Patient Name: [name], [name] Study Date: 03/25/2024  Date of Birth: 1981/08/06 Study Type: Split Night  Age: 43 year MRN #: [id number]  Sex: Male Interpreting Physician: NEYSA REGGY, [id number]  Height: 5' Referring Physician: REGGY NEYSA [phone])  Weight: 162.0 lbs Recording Tech: Charlie George RPSGT  BMI: 31.8 Scoring Tech: Charlie George RPSGT  ESS: 10 Neck Size: 18.5  Mask Type RESMED AIRFIT F20 FFM Final Pressure: 21/17 cm H2O  Mask Size: MEDIUM Supplemental O2: N/A   Study Overview  DIAGNOSTIC TREATMENT  Lights Off: 09:20:46 PM Lights Off: 01:25:31 AM  Lights On:  01:25:31 AM Lights On: 05:21:31 AM  Time in Bed: 244.8 min. Time in Bed: 236.0 min.  Total Sleep Time: 126.0 min. Total Sleep Time: 144.5 min.  Sleep Efficiency: 51.5% Sleep Efficiency: 61.2%  Sleep Latency: 53.3 min. Sleep Latency: 12.5 min.  REM Latency from Sleep Onset: 146.0 min. REM Latency from Sleep Onset: 26.0 min.  Wake After Sleep Onset: 65.5 min. Wake After Sleep Onset: 79.0 min.   DIAGNOSTIC TREATMENT   Count Index  Count Index  Awakenings: 31 14.8 Awakenings: 15 6.2  Arousals: 96 45.7 Arousals: 84 34.9  AHI (>=3% Desat and/or Ar.): 86 41.0 AHI (>=3% Desat and/or Ar.): 80 33.2  AHI (>=4% Desat): 48 22.9 AHI (>=4% Desat): 70 29.1   Limb Movements: - - Limb Movements: - -  Snore: - - Snore: - -  Desaturations: 78 37.1 Desaturations: 75 31.1  Minimum SpO2 TST: 59.0% Minimum SpO2 TST: 68.0%    Sleep Architecture   DIAGNOSTIC TREATMENT ENTIRE NIGHT  Stages Time (mins) % Sleep Time Time (mins) % Sleep Time Time (mins) % Sleep Time  Wake 119.0  91.5  210.5   Stage N1 17.0  13.5% 9.5 6.6% 26.5 9.8%  Stage N2 67.5 53.6% 104.5 72.3% 172.0 63.6%  Stage N3 39.0 31.0% 10.0 6.9% 49.0 18.1%  REM 2.5 2.0% 20.5 14.2% 23.0 8.5%   Arousal Summary   DIAGNOSTIC TREATMENT   NREM REM TST Index NREM REM TST Index  Respiratory Ar. 93 2 95 45.2 70 10 80 33.2  PLM Ar. - - - - - - - -  Isolated Limb Movement Ar. - - - - - - - -  Snore Ar. - - - - - - - -  Spontaneous Ar. 1 - 1 0.5 3 1 4  1.7  Total Ar. 94 2 96 45.7 73 11 84 34.9    Respiratory Summary  DIAGNOSTIC By Sleep Stage By Body Position Total   NREM REM Supine Non-Supine   Time (min) 123.5 2.5 126.0 - 126.0         Obstructive Apnea 31 - 31 - 31  Mixed Apnea 1 - 1 - 1  Central Apnea - - - - -  Total Apneas 32 - 32 - 32  Total Apnea Index 15.5 - 15.2 - 15.2         Hypopneas (>=3% Desat and/or Ar.) 54 - 54 - 54  AHI (>=3% Desat and/or Ar.) 41.8 - 41.0 - 41.0         Hypopneas (>=4% Desat) 16 - 16 - 16  AHI (>=4% Desat)  23.3 - 22.9 - 22.9          RERAs 63 1 64 - 64  RERA Index 30.6 24.0 30.5 - 30.5         RDI 72.4 24.0 71.4 - 71.4    TREATMENT By Sleep Stage By Body Position Total   NREM REM Supine Non-Supine   Time (min) 124.0 20.5 144.5 - 144.5         Obstructive Apnea 42 1 43 - 43  Mixed Apnea - - - - -  Central Apnea 1 - 1 - 1  Total Apneas 43 1 44 - 44  Total Apnea Index 20.8 2.9 18.3 - 18.3         Hypopneas (>=3% Desat and/or Ar.) 26 10 36 - 36  AHI (>=3% Desat and/or Ar.) 33.4 32.2 33.2 - 33.2         Hypopneas (>=4% Desat) 18 8 26  - 26  AHI (>=4% Desat) 29.5 26.3 29.1 - 29.1          RERAs 32 1 33 - 33  RERA Index 15.5 2.9 13.7 - 13.7         RDI 48.9 35.1 46.9 - 46.9    Respiratory Event Durations   DIAGNOSTIC TREATMENT  Apnea NREM REM NREM REM  Average (seconds) 22.0 - 30.0 46.8  Maximum (seconds) 70.8 - 67.6 46.8  Hypopnea      Average (seconds) 20.1 - 41.4 37.7  Maximum (seconds) 45.8 - 107.9 96.0    Limb Movement Summary   DIAGNOSTIC TREATMENT   Count Index Count Index  Isolated Limb Movements - - - -  Periodic Limb Movements (PLMs) - - - -  Total Limb Movements - - - -    Oxygen  Saturation Summary   DIAGNOSTIC TREATMENT   Wake NREM REM TST Wake NREM REM TST  Average SpO2 97.2% 95.3% 92.0% 95.2% 95.3% 94.3% 86.5% 93.2%  Minimum SpO2 65.0% 59.0% 66.0% 59.0%  72.0% 68.0% 73.0% 68.0%   Maximum SpO2 99.0% 99.0% 98.0% 99.0%  98.0% 98.0% 98.0% 98.0%  DIAGNOSTIC Oxygen  Saturation Distribution  Range (%) Time in range (min) Time in range (%)   90.0 - 100.0 237.1 96.8%  80.0 - 90.0 5.7 2.3%  70.0 - 80.0 1.3 0.5%  60.0 - 70.0 0.8 0.3%  50.0 - 60.0 0.1 0.0%  0.0 - 50.0 - -  Time Spent <=88% SpO2  Range (%) Time in range (min) Time in range (%)  0.0 - 88.0 6.1 2.5%      Count Index  Desaturations: 78 37.1   TREATMENT Oxygen  Saturation Distribution  Range (%) Time in range (min) Time in range (%)   90.0 - 100.0 186.3 79.3%  80.0 - 90.0 38.1 16.2%   70.0 - 80.0 8.1 3.5%  60.0 - 70.0 0.1 0.1%  50.0 - 60.0 - -  0.0 - 50.0 - -  Time Spent <=88% SpO2  Range (%) Time in range (min) Time in range (%)  0.0 - 88.0 25.6 10.9%      Count Index  Desaturations: 75 31.1     Cardiac Summary   DIAGNOSTIC TREATMENT   Wake NREM REM Total Wake NREM REM Total  Average Pulse Rate (BPM) 73.9 72.8 69.9 73.3 83.3 80.5 82.7 81.8  Minimum Pulse Rate (BPM) 58.0 58.0 59.0 58.0 64.0 63.0 58.0 58.0  Maximum Pulse Rate (BPM) 96.0 99.0 96.0 99.0 102.0 103.0 104.0 104.0   Pulse Rate Distribution   DIAGNOSTIC  Range (bpm) Time in range (min) Time in range (%)  0.0 - 40.0 - -  40.0 - 60.0 1.0 0.4%  60.0 - 80.0 229.5 93.7%  80.0 - 100.0 14.4 5.9%  100.0 - 120.0 - -  120.0 - 140.0 - -  140.0 - 200.0 - -   TREATMENT  Range (bpm) Time in range (min) Time in range (%)  0.0 - 40.0 - -  40.0 - 60.0 0.2 0.1%  60.0 - 80.0 88.1 37.5%  80.0 - 100.0 143.6 61.0%  100.0 - 120.0 1.1 0.4%  120.0 - 140.0 - -  140.0 - 200.0 - -    Titration Summary  PAP Device PAP Level O2 Level Time (min) Wake (min) NREM (min) REM (min) Sleep Eff% OA# CA# MA# Hyp# (>=3%) AHI (>=3%) Hyp# (>=4%) AHI (>=%4) RERA RDI OSat <=88% (min) Min [employer] Ar. Index  - Off - 245.0 119.0 123.5 2.5 51.4% 31 - 1 54 41.0 16  22.9 64  71.4  5.9 59.0 95.2 45.7  CPAP EPR 6/2 - 19.5 12.5 7.0 0.0 35.9% 2 1 - - 25.7 -  25.7 -  25.7  0.0 93.0 96.6 17.1  CPAP EPR 8/2 - 22.0 0.5 21.0 0.5 97.7% 10 - - 4 39.1 1  30.7 4  50.2  2.5 73.0 94.5 30.7  CPAP EPR 10/2 - 15.0 0.0 12.5 2.5 100.0% 10 - - 1 44.0 -  40.0 -  44.0  4.9 68.0 90.6 36.0  CPAP EPR 12/2 - 15.0 1.0 12.0 2.0 93.3% 8 - - 5 55.7 2  42.9 -  55.7  2.3 71.0 93.8 38.6  CPAP EPR 14/2 - 63.5 41.5 22.0 0.0 34.6% 13 - - 3 43.6 3  43.6 6  60.0  0.5 85.0 94.9 54.5  CPAP EPR 16/2 - 25.5 9.5 14.0 2.0 62.7% - - - 4 15.0 3  11.3 7  41.3  1.4 77.0 92.5 37.5  CPAP EPR 17/2 - 12.5 0.0 2.5 10.0 100.0% - - - 11 52.8 9  43.2 1  57.6  7.2 73.0 87.3 14.4  CPAP EPR 19/2 - 39.5 22.5 13.5 3.5 43.0% - - - 7 24.7 7  24.7 5  42.4  3.8 75.0 92.1 35.3  BiLevel 21/17/0 - 23.5 4.0 19.5 0.0 83.0% - - - 1 3.1 1  3.1 10  33.8  0.0 92.0 95.4 30.8    Hypnograms                           Technologist Comments  Patient arrived with his mother for a split night study. The patient was placed in room 6. The procedure was explained and the patient had no questions. The patient was fitted for a mask and trialed CPAP at a pressure of 6 cm H2O prior to study start.  The patient met split night criteria. CPAP was started at a pressure of 6 cm H2O with an EPR of 2 and was increased to a pressure of 19 cm H2O with an EPR of 2. The patient continued to have events, and due to this, was switched to Bilevel due to CPAP pressure exceeding 20 cm H2O. Bilevel was started at a pressure of 21/17 cm H2O and was not increased due to time constraints. The patient tolerated therapy well. A medium ResMed AirFit F20 full-face mask was used during the titration. The patient slept in the supine position. Oral venting was observed. No PLMs, bruxism, seizure, or spike wave activity was observed. All stages of sleep were recorded. Snoring was moderate during the diagnostic portion of the study and resolved with PAP therapy. No ECG arrhythmia was observed. Supine REM was recorded, but not at an adequate pressure.                          Reggy Salt Diplomate, Biomedical engineer of Sleep Medicine  ELECTRONICALLY SIGNED ON:  03/31/2024, 11:35 AM [facility] PH: [phone]   FX: [phone] ACCREDITED BY THE AMERICAN ACADEMY OF SLEEP MEDICINE

## 2024-04-17 NOTE — Progress Notes (Signed)
 HPI M never smoker followed for OSA, complicated by Nocturnal Hypoxemia, HTN, Arnold Chiari / Spina Bifida, Congenital Paraplegia, Suprapubic Catheter,  VP Shunt, Ileostomy, CKD, Mother helps manage his suprapubic catheter and ileostomy. Split PSG 03/25/24- AHI 41/hr, desat to 59%. Titrated to BiPap 21/17, PS 0 =======================================================================================================  01/16/24- 42 yoM for sleep evaluation courtesy of Dr Rayna Hemming with concern of OSA Medical problem list includes Nocturnal Hypoxemia, HTN, Arnold Chiari / Spina Bifida, Congenital Paraplegia, Suprapubic Catheter,  VP Shunt, Ileostomy, CKD, Mother here.  Discussed the use of AI scribe software for clinical note transcription with the patient, who gave verbal consent to proceed. History of Present Illness   [name] is a 43 year old male who presents with nocturnal hypoxemia. He was referred by Dr. Palawal for evaluation of low oxygen  levels during sleep.  He experiences nocturnal oxygen  desaturation, first identified during hospital stays in January and March, where supplemental oxygen  was required. Desaturation was detected by a machine alarm at night. Mother confirms snoring and probable apneas at night, and there is no family history of sleep apnea. He has not had surgeries involving the nose or throat. A VP shunt is in place, but there are no known heart or lung conditions.  He does not use medications for sleep or alertness and primarily consumes water . Daytime sleepiness occurs only when unwell. He naps for 30 minutes to an hour, sometimes twice daily, usually postprandially.  Mother helps manage his suprapubic catheter and ileostomy.    In-center study needed due to medical complexity.   04/18/24- 42 yoM never smoker followed for OSA, complicated by Nocturnal Hypoxemia, HTN, Arnold Chiari / Spina Bifida, Congenital Paraplegia, Suprapubic Catheter,  VP Shunt, Ileostomy,  CKD, Mother helps manage his suprapubic catheter and ileostomy.  Split PSG 03/25/24- AHI 41/hr, desat to 59%. Titrated to BiPap 21/17, PS 0 For treatment decision Best initial option is Bipap or maybe ASV. He and mother are agreeable to trying BIPAP.  Discussed the use of AI scribe software for clinical note transcription with the patient, who gave verbal consent to proceed.  History of Present Illness   [name] is a 43 year old male who presents with obstructive sleep apnea for follow-up after a sleep study.  A sleep study confirmed severe obstructive sleep apnea with episodes of breathing cessation due to throat collapse. During the study, CPAP was insufficient and a BiPAP machine was needed, which maintained airway patency. He does not use any home care equipment such as oxygen .   He remains wheelchair-bound due to cerebral palsy with paraplegia. Mother is here.   Assessment and Plan:    Obstructive sleep apnea Obstructive sleep apnea confirmed via sleep study. BiPAP recommended for airway patency. Discussed mask fit importance and BiPAP safety. Advised on sleeping positions. - Order BiPAP machine through a home care company. - Ensure proper mask fit for BiPAP. - Coordinate with patient care coordinator for BiPAP delivery.      ROS-see HPI   + = positive Constitutional:    weight loss, night sweats, fevers, chills, fatigue, lassitude. HEENT:    headaches, difficulty swallowing, tooth/dental problems, sore throat,       sneezing, itching, ear ache, nasal congestion, post nasal drip, snoring CV:    chest pain, orthopnea, PND, swelling in lower extremities, anasarca,  dizziness, palpitations Resp:   shortness of breath with exertion or at rest.                productive cough,   non-productive cough, coughing up of blood.              change in color of mucus.  wheezing.   Skin:    rash or lesions. GI:  No-   heartburn, indigestion, abdominal  pain, nausea, vomiting, diarrhea,                 change in bowel habits, loss of appetite GU: dysuria, change in color of urine, no urgency or frequency.   flank pain. MS:   joint pain, stiffness, decreased range of motion, back pain. Neuro-     nothing unusual Psych:  change in mood or affect.  depression or anxiety.   memory loss.  OBJ- Physical Exam General- Alert, Oriented, Affect-appropriate, Distress- none acute, +Power wheelchair Skin- rash-none, lesions- none, excoriation- none Lymphadenopathy- none Head- atraumatic            Eyes- Gross vision intact, PERRLA, conjunctivae and secretions clear            Ears- Hearing, canals-normal            Nose- Clear, no-Septal dev, mucus, polyps, erosion, perforation             Throat- Mallampati IV , mucosa clear , drainage- none, tonsils- atrophic, +teeth Neck- flexible , trachea midline, no stridor , thyroid  nl, carotid no bruit Chest - symmetrical excursion , unlabored           Heart/CV- RRR , no murmur , no gallop  , no rub, nl s1 s2                           - JVD- none , edema- none, stasis changes- none, varices- none           Lung- clear to P&A, wheeze- none, cough- none , dullness-none, rub- none           Chest wall-  Abd- +ileostomy, +suprapubic catheter Br/ Gen/ Rectal- Not done, not indicated Extrem- +lower extremities atrophic Neuro- grossly intact to observation above waist

## 2024-04-18 ENCOUNTER — Encounter: Payer: Self-pay | Admitting: Internal Medicine

## 2024-04-18 ENCOUNTER — Ambulatory Visit: Admitting: Internal Medicine

## 2024-04-18 VITALS — BP 111/76 | HR 86 | Temp 98.3°F

## 2024-04-18 DIAGNOSIS — G4733 Obstructive sleep apnea (adult) (pediatric): Secondary | ICD-10-CM

## 2024-04-18 NOTE — Patient Instructions (Signed)
 Order- new DME, new BIPAP 21/17, mask of choice, humidifier, supplies, AirView/ card  Please call if we can help

## 2024-04-23 ENCOUNTER — Other Ambulatory Visit: Payer: Self-pay

## 2024-04-23 DIAGNOSIS — G4733 Obstructive sleep apnea (adult) (pediatric): Secondary | ICD-10-CM

## 2024-04-27 ENCOUNTER — Encounter: Payer: Self-pay | Admitting: Internal Medicine

## 2024-05-01 ENCOUNTER — Telehealth: Payer: Self-pay | Admitting: Internal Medicine

## 2024-05-02 NOTE — Telephone Encounter (Signed)
 Patient mother called in regards to cpap,order placed and processing.NFN

## 2024-06-28 ENCOUNTER — Emergency Department (HOSPITAL_COMMUNITY)

## 2024-06-28 ENCOUNTER — Encounter (HOSPITAL_COMMUNITY): Payer: Self-pay

## 2024-06-28 ENCOUNTER — Other Ambulatory Visit: Payer: Self-pay

## 2024-06-28 ENCOUNTER — Observation Stay (HOSPITAL_COMMUNITY)
Admission: EM | Admit: 2024-06-28 | Discharge: 2024-06-30 | Disposition: A | Discharge status: Home / Self Care | Attending: Internal Medicine | Admitting: Internal Medicine

## 2024-06-28 DIAGNOSIS — Z91040 Latex allergy status: Secondary | ICD-10-CM | POA: Insufficient documentation

## 2024-06-28 DIAGNOSIS — Z6830 Body mass index (BMI) 30.0-30.9, adult: Secondary | ICD-10-CM | POA: Diagnosis not present

## 2024-06-28 DIAGNOSIS — K567 Ileus, unspecified: Principal | ICD-10-CM | POA: Insufficient documentation

## 2024-06-28 DIAGNOSIS — Z8661 Personal history of infections of the central nervous system: Secondary | ICD-10-CM | POA: Insufficient documentation

## 2024-06-28 DIAGNOSIS — N179 Acute kidney failure, unspecified: Secondary | ICD-10-CM | POA: Insufficient documentation

## 2024-06-28 DIAGNOSIS — G40909 Epilepsy, unspecified, not intractable, without status epilepticus: Secondary | ICD-10-CM | POA: Insufficient documentation

## 2024-06-28 DIAGNOSIS — R770 Abnormality of albumin: Secondary | ICD-10-CM | POA: Insufficient documentation

## 2024-06-28 DIAGNOSIS — R8271 Bacteriuria: Secondary | ICD-10-CM | POA: Insufficient documentation

## 2024-06-28 DIAGNOSIS — E66811 Obesity, class 1: Secondary | ICD-10-CM | POA: Diagnosis not present

## 2024-06-28 DIAGNOSIS — I10 Essential (primary) hypertension: Secondary | ICD-10-CM | POA: Diagnosis not present

## 2024-06-28 DIAGNOSIS — Q0700 Arnold-Chiari syndrome without spina bifida or hydrocephalus: Secondary | ICD-10-CM | POA: Insufficient documentation

## 2024-06-28 DIAGNOSIS — N319 Neuromuscular dysfunction of bladder, unspecified: Secondary | ICD-10-CM | POA: Diagnosis not present

## 2024-06-28 DIAGNOSIS — R339 Retention of urine, unspecified: Secondary | ICD-10-CM | POA: Diagnosis present

## 2024-06-28 DIAGNOSIS — Z79899 Other long term (current) drug therapy: Secondary | ICD-10-CM | POA: Diagnosis not present

## 2024-06-28 DIAGNOSIS — Z982 Presence of cerebrospinal fluid drainage device: Secondary | ICD-10-CM

## 2024-06-28 DIAGNOSIS — Q059 Spina bifida, unspecified: Secondary | ICD-10-CM

## 2024-06-28 LAB — URINALYSIS, ROUTINE W REFLEX MICROSCOPIC
Bilirubin Urine: NEGATIVE
Glucose, UA: NEGATIVE mg/dL
Hgb urine dipstick: NEGATIVE
Ketones, ur: NEGATIVE mg/dL
Nitrite: NEGATIVE
Protein, ur: 100 mg/dL — AB
Specific Gravity, Urine: 1.021 (ref 1.005–1.030)
pH: 5 (ref 5.0–8.0)

## 2024-06-28 LAB — CBC WITH DIFFERENTIAL/PLATELET
Abs Immature Granulocytes: 0.16 K/uL — ABNORMAL HIGH (ref 0.00–0.07)
Basophils Absolute: 0 K/uL (ref 0.0–0.1)
Basophils Relative: 0 %
Eosinophils Absolute: 0 K/uL (ref 0.0–0.5)
Eosinophils Relative: 0 %
HCT: 55.3 % — ABNORMAL HIGH (ref 39.0–52.0)
Hemoglobin: 17.3 g/dL — ABNORMAL HIGH (ref 13.0–17.0)
Immature Granulocytes: 1 %
Lymphocytes Relative: 10 %
Lymphs Abs: 1.9 K/uL (ref 0.7–4.0)
MCH: 24.3 pg — ABNORMAL LOW (ref 26.0–34.0)
MCHC: 31.3 g/dL (ref 30.0–36.0)
MCV: 77.6 fL — ABNORMAL LOW (ref 80.0–100.0)
Monocytes Absolute: 1.5 K/uL — ABNORMAL HIGH (ref 0.1–1.0)
Monocytes Relative: 8 %
Neutro Abs: 15.9 K/uL — ABNORMAL HIGH (ref 1.7–7.7)
Neutrophils Relative %: 81 %
Platelets: 402 K/uL — ABNORMAL HIGH (ref 150–400)
RBC: 7.13 MIL/uL — ABNORMAL HIGH (ref 4.22–5.81)
RDW: 14.6 % (ref 11.5–15.5)
WBC: 19.5 K/uL — ABNORMAL HIGH (ref 4.0–10.5)
nRBC: 0 % (ref 0.0–0.2)

## 2024-06-28 LAB — LACTIC ACID, PLASMA
Lactic Acid, Venous: 1.9 mmol/L (ref 0.5–1.9)
Lactic Acid, Venous: 3.2 mmol/L (ref 0.5–1.9)

## 2024-06-28 LAB — COMPREHENSIVE METABOLIC PANEL WITH GFR
ALT: 64 U/L — ABNORMAL HIGH (ref 0–44)
AST: 32 U/L (ref 15–41)
Albumin: 4 g/dL (ref 3.5–5.0)
Alkaline Phosphatase: 102 U/L (ref 38–126)
Anion gap: 17 — ABNORMAL HIGH (ref 5–15)
BUN: 55 mg/dL — ABNORMAL HIGH (ref 6–20)
CO2: 29 mmol/L (ref 22–32)
Calcium: 9.9 mg/dL (ref 8.9–10.3)
Chloride: 87 mmol/L — ABNORMAL LOW (ref 98–111)
Creatinine, Ser: 0.93 mg/dL (ref 0.61–1.24)
GFR, Estimated: >60 mL/min (ref >60)
Glucose, Bld: 150 mg/dL — ABNORMAL HIGH (ref 70–99)
Potassium: 4.2 mmol/L (ref 3.5–5.1)
Sodium: 133 mmol/L — ABNORMAL LOW (ref 135–145)
Total Bilirubin: 0.6 mg/dL (ref 0.0–1.2)
Total Protein: 7.9 g/dL (ref 6.5–8.1)

## 2024-06-28 LAB — I-STAT CHEM 8, ED
BUN: 61 mg/dL — ABNORMAL HIGH (ref 6–20)
Calcium, Ion: 1.09 mmol/L — ABNORMAL LOW (ref 1.15–1.40)
Chloride: 91 mmol/L — ABNORMAL LOW (ref 98–111)
Creatinine, Ser: 1.3 mg/dL — ABNORMAL HIGH (ref 0.61–1.24)
Glucose, Bld: 160 mg/dL — ABNORMAL HIGH (ref 70–99)
HCT: 56 % — ABNORMAL HIGH (ref 39.0–52.0)
Hemoglobin: 19 g/dL — ABNORMAL HIGH (ref 13.0–17.0)
Potassium: 3.9 mmol/L (ref 3.5–5.1)
Sodium: 133 mmol/L — ABNORMAL LOW (ref 135–145)
TCO2: 34 mmol/L — ABNORMAL HIGH (ref 22–32)

## 2024-06-28 LAB — I-STAT CG4 LACTIC ACID, ED
Lactic Acid, Venous: 2.9 mmol/L (ref 0.5–1.9)
Lactic Acid, Venous: 2.7 mmol/L (ref 0.5–1.9)

## 2024-06-28 LAB — LIPASE, BLOOD: Lipase: 16 U/L (ref 11–51)

## 2024-06-28 MED ORDER — LACTATED RINGERS IV BOLUS
1000.0000 mL | Freq: Once | INTRAVENOUS | Status: AC
  Administered 2024-06-28: 1000 mL via INTRAVENOUS

## 2024-06-28 MED ORDER — ACETAMINOPHEN 325 MG PO TABS
650.0000 mg | ORAL_TABLET | Freq: Four times a day (QID) | ORAL | Status: DC | PRN
Start: 2024-06-28 — End: 2024-06-30
  Administered 2024-06-28 – 2024-06-29 (×2): 650 mg via ORAL
  Filled 2024-06-28 (×2): qty 2

## 2024-06-28 MED ORDER — BISACODYL 5 MG PO TBEC: 5.0000 mg | DELAYED_RELEASE_TABLET | Freq: Every day | ORAL | Status: DC | PRN

## 2024-06-28 MED ORDER — ACETAMINOPHEN 650 MG RE SUPP: 650.0000 mg | Freq: Four times a day (QID) | RECTAL | Status: DC | PRN

## 2024-06-28 MED ORDER — IOHEXOL 350 MG/ML SOLN
100.0000 mL | Freq: Once | INTRAVENOUS | Status: AC | PRN
  Administered 2024-06-28: 100 mL via INTRAVENOUS

## 2024-06-28 MED ORDER — LACTATED RINGERS IV SOLN: INTRAVENOUS | Status: AC

## 2024-06-28 MED ORDER — LACTATED RINGERS IV BOLUS (SEPSIS)
1000.0000 mL | Freq: Once | INTRAVENOUS | Status: AC
  Administered 2024-06-28: 1000 mL via INTRAVENOUS

## 2024-06-28 MED ORDER — SODIUM CHLORIDE 0.9 % IV SOLN
2.0000 g | Freq: Once | INTRAVENOUS | Status: AC
  Administered 2024-06-28: 2 g via INTRAVENOUS
  Filled 2024-06-28: qty 12.5

## 2024-06-28 MED ORDER — ONDANSETRON HCL 4 MG/2ML IJ SOLN: 4.0000 mg | Freq: Four times a day (QID) | INTRAMUSCULAR | Status: DC | PRN

## 2024-06-28 MED ORDER — METRONIDAZOLE 500 MG/100ML IV SOLN
500.0000 mg | Freq: Once | INTRAVENOUS | Status: AC
  Administered 2024-06-28: 500 mg via INTRAVENOUS
  Filled 2024-06-28: qty 100

## 2024-06-28 MED ORDER — ONDANSETRON HCL 4 MG/2ML IJ SOLN: 4.0000 mg | Freq: Once | INTRAMUSCULAR | Status: DC

## 2024-06-28 MED ORDER — ONDANSETRON HCL 4 MG PO TABS: 4.0000 mg | ORAL_TABLET | Freq: Four times a day (QID) | ORAL | Status: DC | PRN

## 2024-06-28 MED ORDER — ENOXAPARIN SODIUM 40 MG/0.4ML IJ SOSY
40.0000 mg | PREFILLED_SYRINGE | INTRAMUSCULAR | Status: DC
  Administered 2024-06-28 – 2024-06-29 (×2): 40 mg via SUBCUTANEOUS
  Filled 2024-06-28 (×2): qty 0.4

## 2024-06-28 MED ORDER — LACTATED RINGERS IV BOLUS (SEPSIS)
250.0000 mL | Freq: Once | INTRAVENOUS | Status: AC
  Administered 2024-06-28: 250 mL via INTRAVENOUS

## 2024-06-28 NOTE — Sepsis Progress Note (Signed)
 eLink is following this Code Sepsis.

## 2024-06-28 NOTE — ED Provider Notes (Signed)
 Accepted handoff at shift change from [place], PA-C. Please see prior provider note for more detail.   Briefly: Patient is 43 y.o.   DDX: concern for urinary retention, dehydration, bowel obstruction, ostomy complications  Plan: Await for results of CT angio abdomen and pelvis for full evaluation final disposition  Physical Exam  BP 112/70 (BP Location: Right Arm)   Pulse 79   Temp (!) 97.5 F (36.4 C) (Oral)   Resp 18   Ht 5' (1.524 m)   Wt 73.5 kg   SpO2 97%   BMI 31.65 kg/m   Physical Exam Vitals and nursing note reviewed.  Constitutional:      General: He is not in acute distress.    Appearance: He is well-developed.  HENT:     Head: Normocephalic and atraumatic.  Eyes:     Conjunctiva/sclera: Conjunctivae normal.  Cardiovascular:     Rate and Rhythm: Normal rate and regular rhythm.     Heart sounds: No murmur heard. Pulmonary:     Effort: Pulmonary effort is normal. No respiratory distress.     Breath sounds: Normal breath sounds.  Abdominal:     General: Abdomen is flat. Bowel sounds are normal. There is distension.     Palpations: Abdomen is soft.     Tenderness: There is no abdominal tenderness. There is no guarding.  Musculoskeletal:        General: No swelling.     Cervical back: Neck supple.  Skin:    General: Skin is warm and dry.     Capillary Refill: Capillary refill takes less than 2 seconds.  Neurological:     Mental Status: He is alert.  Psychiatric:        Mood and Affect: Mood normal.     Procedures  Procedures  ED Course / MDM   Clinical Course as of 06/28/24 1820  Fri Jun 28, 2024  1435 Lactic 2.9 -> 2.65 [LS]  1757 Spoke with Dr. Aron, general surgery, who will have team available to consult on patient if needed and will plan on evaluation in the morning if patient is not improving. [OZ]    Clinical Course User Index [LS] Rogelia Jerilynn RAMAN, MD [OZ] Cecily Legrand LABOR, PA-C   Medical Decision Making Amount and/or Complexity of  Data Reviewed Labs: ordered. Radiology: ordered.  Risk Prescription drug management. Decision regarding hospitalization.   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  Please see their note for full HPI and physical exam findings.  In brief, patient here with concerns of abdominal distention, decreased output from ileostomy site, and decreased urinary output.  No vomiting or diarrhea.  At this time, disposition per results of CT angio imaging of the abdomen.  Clinically, he does appear to be somewhat volume down with possible AKI developing given patient's elevation in creatinine from baseline of approximately 0.4 up to 0.9.  CT angio abdomen pelvis shows concerns for possible developing small bowel obstruction versus ileus.  Consult to general surgery placed.  Spoke with Dr. Aron, general surgery, who advised that general surgery team will be available for consultation but no immediate plans for surgical management.  Spoke with Dr. Arthea, hospitalist, who will be admitting patient.    Dandria Griego A, PA-C 06/28/24 1820    Ruthe Cornet, DO 06/29/24 1552

## 2024-06-28 NOTE — ED Triage Notes (Signed)
 Pt bib EMS lack of PO intake, decreased urine output and decreased ostomy output. Urine color change reported. 400 LR given. 119/78 BP 95% 137 CBG 73 HR. Beta blockers taken for htn.

## 2024-06-28 NOTE — ED Provider Notes (Signed)
 [name] [facility] DEPARTMENT AT [facility] Provider Note   [name] Arrival date & time: 06/28/24  1127     Patient presents with: Urinary Retention and Decreased output ileostomy   [name] is a 43 y.o. male with history of ileostomy, congenital paraplegia, spina bifida, hypertension presents emerged from today for evaluation of decreased appetite with decreased in output from his urinary catheter as well as his ileostomy.  Patient reports that  he had some nausea with decreased appetite starting yesterday.  Reports that yesterday he had half of his stool output that he usually has and today has not had much stool output in his ileostomy.  Reports a decrease in his urinary catheter as well.  Reports some nausea but denies any vomiting.  No blood present in the stool.  Reports that he does have some darker sediment in the urine and reports that his urine was dark in coloration as well but no blood.  He was given some fluids by EMS.  Denies any history of small bowel obstruction.  HPI     Prior to Admission medications   Medication Sig Start Date End Date Taking? Authorizing Provider  acetaminophen  (TYLENOL ) 650 MG CR tablet Take 1,300 mg by mouth every 8 (eight) hours as needed for pain.    [provider]  Ascorbic Acid (VITAMIN C ) 1000 MG tablet Take 1,000 mg by mouth daily with lunch.    [provider]  calcium  elemental as carbonate (BARIATRIC TUMS ULTRA) 400 MG chewable tablet Chew 2,000 mg by mouth 3 (three) times daily after meals.    [provider]  Cranberry-Vitamin C -Vitamin E 4200-20-3 MG-MG-UNIT CAPS Take 2 capsules by mouth in the morning, at noon, and at bedtime.    [provider]  KLOR-CON  M20 20 MEQ tablet Take 20 mEq by mouth daily. 03/14/23   [provider]  levETIRAcetam  (KEPPRA ) 500 MG tablet Take 500 mg by mouth 2 (two) times daily.    [provider]  liver oil-zinc oxide (DESITIN) 40  % ointment Apply 1 Application topically as needed for irritation.    [provider]  metoprolol  tartrate (LOPRESSOR ) 50 MG tablet Take 50 mg by mouth 2 (two) times daily.    [provider]  Multiple Minerals-Vitamins (CALCIUM -MAGNESIUM -ZINC-D3) TABS Take 1 tablet by mouth in the morning and at bedtime.    [provider]  Multiple Vitamin (MULTIVITAMIN WITH MINERALS) TABS tablet Take 1 tablet by mouth daily in the afternoon.    [provider]  nystatin cream (MYCOSTATIN) Apply 1 Application topically daily as needed for dry skin. 05/01/23   [provider]  ondansetron  (ZOFRAN ) 4 MG tablet Take 4 mg by mouth every 8 (eight) hours as needed for nausea or vomiting. 03/14/23   [provider]  oxybutynin  (DITROPAN ) 5 MG tablet Take 5 mg by mouth 2 (two) times daily. 10/03/19   [provider]  polyethylene glycol (MIRALAX  / GLYCOLAX ) 17 g packet Take 17 g by mouth daily. 08/17/23   Bryn Bernardino NOVAK, MD  rosuvastatin  (CRESTOR ) 20 MG tablet Take 20 mg by mouth in the morning. 09/10/18   [provider]    Allergies: Latex, Succinylcholine , Ciprofloxacin, Vancomycin, Adhesive [tape], and Wound dressing adhesive    Review of Systems  Constitutional:  Negative for chills and fever.  Respiratory:  Negative for shortness of breath.   Cardiovascular:  Negative for chest pain.  Gastrointestinal:  Positive for abdominal distention and nausea. Negative for  blood in stool and vomiting.  Genitourinary:        Reports darker urine with sediment    Updated Vital Signs BP 119/80   Pulse 71   Temp (!) 97.4 F (36.3 C) (Oral)   Resp 18   Ht 5' (1.524 m)   Wt 73.5 kg   SpO2 98%   BMI 31.65 kg/m   Physical Exam Vitals and nursing note reviewed.  Constitutional:      Appearance: He is not toxic-appearing.     Comments: Chronically ill appearing  HENT:     Mouth/Throat:     Mouth: Mucous membranes are dry.  Eyes:     General: No  scleral icterus. Cardiovascular:     Rate and Rhythm: Normal rate.  Pulmonary:     Effort: Pulmonary effort is normal. No respiratory distress.  Abdominal:     General: There is distension.     Comments: Distended, but soft. Reports the abdomen is uncomfortable from bloating upon palpation, but not painful. Hypoactive bowel sounds. Large, old surgical scars present in the abdomen. Ileostomy present in the right abdomen.   Skin:    General: Skin is warm and dry.  Neurological:     Mental Status: He is alert.     (all labs ordered are listed, but only abnormal results are displayed) Labs Reviewed  CBC WITH DIFFERENTIAL/PLATELET - Abnormal; Notable for the following components:      Result Value   WBC 19.5 (*)    RBC 7.13 (*)    Hemoglobin 17.3 (*)    HCT 55.3 (*)    MCV 77.6 (*)    MCH 24.3 (*)    Platelets 402 (*)    Neutro Abs 15.9 (*)    Monocytes Absolute 1.5 (*)    Abs Immature Granulocytes 0.16 (*)    All other components within normal limits  COMPREHENSIVE METABOLIC PANEL WITH GFR - Abnormal; Notable for the following components:   Sodium 133 (*)    Chloride 87 (*)    Glucose, Bld 150 (*)    BUN 55 (*)    ALT 64 (*)    Anion gap 17 (*)    All other components within normal limits  URINALYSIS, ROUTINE W REFLEX MICROSCOPIC - Abnormal; Notable for the following components:   Color, Urine AMBER (*)    APPearance HAZY (*)    Protein, ur 100 (*)    Leukocytes,Ua MODERATE (*)    Bacteria, UA MANY (*)    All other components within normal limits  I-STAT CHEM 8, ED - Abnormal; Notable for the following components:   Sodium 133 (*)    Chloride 91 (*)    BUN 61 (*)    Creatinine, Ser 1.30 (*)    Glucose, Bld 160 (*)    Calcium , Ion 1.09 (*)    TCO2 34 (*)    Hemoglobin 19.0 (*)    HCT 56.0 (*)    All other components within normal limits  I-STAT CG4 LACTIC ACID, ED - Abnormal; Notable for the following components:   Lactic Acid, Venous 2.9 (*)    All other  components within normal limits  I-STAT CG4 LACTIC ACID, ED - Abnormal; Notable for the following components:   Lactic Acid, Venous 2.7 (*)    All other components within normal limits  CULTURE, BLOOD (ROUTINE X 2)  CULTURE, BLOOD (ROUTINE X 2)  URINE CULTURE  LIPASE, BLOOD  LACTIC ACID, PLASMA  LACTIC ACID, PLASMA    EKG:  None  Radiology: No results found.  Procedures   Medications Ordered in the ED  lactated ringers  infusion ( Intravenous New Bag/Given 06/28/24 1406)  iohexol  (OMNIPAQUE ) 350 MG/ML injection 100 mL (has no administration in time range)  ceFEPIme  (MAXIPIME ) 2 g in sodium chloride  0.9 % 100 mL IVPB (0 g Intravenous Stopped 06/28/24 1424)  metroNIDAZOLE  (FLAGYL ) IVPB 500 mg (0 mg Intravenous Stopped 06/28/24 1555)  lactated ringers  bolus 1,000 mL (1,000 mLs Intravenous New Bag/Given 06/28/24 1330)    And  lactated ringers  bolus 1,000 mL (1,000 mLs Intravenous New Bag/Given 06/28/24 1330)    And  lactated ringers  bolus 250 mL (0 mLs Intravenous Stopped 06/28/24 1556)    Clinical Course as of 06/28/24 1603  Fri Jun 28, 2024  1435 Lactic 2.9 -> 2.65 [LS]    Clinical Course User Index [LS] Rogelia Jerilynn RAMAN, MD   Medical Decision Making Amount and/or Complexity of Data Reviewed Labs: ordered. Radiology: ordered.  Risk Prescription drug management.   43 y.o. male presents to the ER for evaluation of abdominal bloating. Differential diagnosis includes but is not limited to AAA, mesenteric ischemia, appendicitis, diverticulitis, DKA, gastroenteritis, nephrolithiasis, pancreatitis, constipation, UTI, bowel obstruction, biliary disease, IBD, PUD, hepatitis. Vital signs temp 97.50F otherwise unremarkable. Physical exam as noted above.   Patient was admitted earlier this year for dehydration and then has a separate admission for hyponatremia and ileus.  I independently reviewed and interpreted the patient's labs.  CBC elevated at 19.5.  Left shift of 15.9 with  a neutrophil count.  He does have elevated hemoglobin and hematocrit.  Question some hemoconcentration from dehydration.  CMP shows sodium 133, chloride 87.  Glucose at 150 with a BUN of 35.  ALT is 64 with an anion gap of 17.  No other electrolyte or LFT abnormality.  Lipase within normal limits.  Lactic acid went from 2.9-2.65 however it was redrawn before full liter of fluid especially, full sepsis fluids were finished.  Urinalysis shows amber, hazy urine.  There is 100 protein with moderate amount leukocytes with 6-10 red blood cells, 21-50 white blood cells and many bacteria.  Blood cultures and urine cultures are pending.  Given the initially elevated CBC that first resulted, I did start the patient on sepsis workup with fluids and antibiotics.  Question of this is more intra-abdominal given his symptoms.  He was given cefepime  and Flagyl .  Currently still waiting on CT scan.  Awaiting CT scan.  Will handoff to oncoming shift.  4:04 PM Care of Josealberto Montalto Glanzer transferred to PA Zelaya at the end of my shift as the patient will require reassessment once labs/imaging have resulted. Patient presentation, ED course, and plan of care discussed with review of all pertinent labs and imaging. Please see his/her note for further details regarding further ED course and disposition. Plan at time of handoff is follow up with labs/imaging. This may be altered or completely changed at the discretion of the oncoming team pending results of further workup.  Portions of this report may have been transcribed using voice recognition software. Every effort was made to ensure accuracy; however, inadvertent computerized transcription errors may be present.   Final diagnoses:  None    ED Discharge Orders     None          Bernis Ernst, [place] 06/28/24 1621    Rogelia Jerilynn RAMAN, MD 06/28/24 [phone]

## 2024-06-28 NOTE — H&P (Addendum)
 History and Physical    Patient: [name] FMW:[id number] DOB: 1981-01-28 DOA: 06/28/2024 DOS: the patient was seen and examined on 06/28/2024 PCP: Haze Kingfisher, MD  Patient coming from: Home  Chief Complaint:  Chief Complaint  Patient presents with   Urinary Retention   Decreased output ileostomy   HPI: [name] is a 43 y.o. male with medical history significant for spina bifida/congenital paraplegia, hydrocephalus-s/p VP shunt, neurogenic bladder with suprapubic pubic catheter, complicated abdominal surgery history s/p hemicolectomy with ileostomy who presented with abdominal distention and less output from his ileostomy site.  The patient reports that his mother noticed that his belly was much more distended.  He denies any pain or discomfort.  His ileostomy is still putting out just less than usual. He does feel dehydrated. Nausea but no vomiting.  The patient's last hospitalization was 8 months ago for the exact same thing.  The patient uses his finger to manually dilate his stoma and that seemed to help resolve the issues last time. He lives with his mother and brother.  His mother is his primary caregiver.  Review of Systems: As mentioned in the history of present illness. All other systems reviewed and are negative. Past Medical History:  Diagnosis Date   Arnold-Chiari malformation (HCC)    Congenital paraplegia (HCC)    MID-ABDOMIN DOWN  S/P SPINA BIFIDA   GERD (gastroesophageal reflux disease)    Heart palpitations    SECONDARY TO VP SHUNT DRAIN   History of kidney stones    History of seizure    per pt mother, no seizures since age 65   Hypertension    Ileostomy in place [facility]) 2014   Migraines followed by neurologist @[facility]    taking verapamil    Neurogenic bladder    INCONTINENT -   Neurogenic bowel    S/P VP shunt    Spina bifida with hydrocephalus, lumbar region (HCC)    CONGENITAL--   VP SHUNT in place--  PARALYZIED MID ABDOMEN DOWN   Urinary  incontinence with continuous leakage    Wears glasses    Wheelchair bound    CAN TRASFER SELF WITHOUT BOARD   Past Surgical History:  Procedure Laterality Date   BACK SURGERY  infant   get skin to stretch over his back; spinal bifida   BACK SURGERY  age 3   scoliosis and  removed hump on his back (kyphosis) and put rods in to stabalize back   BOTOX  INJECTION N/A 11/14/2018   Procedure: BOTOX  INJECTION CYSTOSCOPY;  Surgeon: Carolee Sherwood JONETTA DOUGLAS, MD;  Location: [facility];  Service: Urology;  Laterality: N/A;   BOTOX  INJECTION N/A 05/22/2019   Procedure: BOTOX  INJECTION;  Surgeon: Carolee Sherwood JONETTA DOUGLAS, MD;  Location: [facility];  Service: Urology;  Laterality: N/A;   BOTOX  INJECTION N/A 11/13/2019   Procedure: CYSTOSCOPY BOTOX  INJECTION;  Surgeon: Carolee Sherwood JONETTA DOUGLAS, MD;  Location: [facility];  Service: Urology;  Laterality: N/A;   BOTOX  INJECTION N/A 07/01/2020   Procedure: CYSTOSCOPY, BOTOX  INJECTION, BLADDER FULGERATION, SUPRAPUBIC TUBE EXCHANGE;  Surgeon: Devere Lonni Righter, MD;  Location: [facility];  Service: Urology;  Laterality: N/A;   BOTOX  INJECTION N/A 12/14/2020   Procedure: BOTOX  INJECTION 300 UNITS SUPRAPUBIC TUBE CHANGE;  Surgeon: Carolee Sherwood JONETTA DOUGLAS, MD;  Location: [facility];  Service: Urology;  Laterality: N/A;   BOTOX  INJECTION N/A 06/02/2023   Procedure: CYSTOSCOPY BOTOX  INJECTION 200 UNITS;  Surgeon: Carolee Sherwood JONETTA DOUGLAS, MD;  Location: [facility];  Service: Urology;  Laterality: N/A;  1 HR FOR CASE   CHOLECYSTECTOMY N/A 07/06/2013   Procedure: LAPAROSCOPIC CHOLECYSTECTOMY WITH INTRAOPERATIVE CHOLANGIOGRAM;  Surgeon: Redell Faith, DO;  Location: [facility];  Service: General;  Laterality: N/A;   COLON RESECTION N/A 07/08/2013   Procedure: EXPLORATORY LAPAROSCOPY, DIAGNOSTIC LAPAROSCOPY, PARTIAL COLECTOMY, ABDOMINAL WASHOUT, ABDOMINAL WOUND VAC;  Surgeon: Redell Faith, DO;  Location: [facility];  Service: General;  Laterality: N/A;   CYSTOSCOPY WITH INJECTION N/A 02/19/2024   Procedure: CYSTOSCOPY, WITH  INJECTION OF BLADDER NECK OR BLADDER WALL;  Surgeon: Carolee Sherwood JONETTA DOUGLAS, MD;  Location: [facility];  Service: Urology;  Laterality: N/A;  200 UNITS BOTOX    CYSTOSCOPY WITH LITHOLAPAXY N/A 05/09/2022   Procedure: CYSTOSCOPY WITH LITHOLAPAXY, BOTOX  INJECTION;  Surgeon: Carolee Sherwood JONETTA DOUGLAS, MD;  Location: [facility];  Service: Urology;  Laterality: N/A;   HIP SURGERY Bilateral AS CHILD   TENDON RELEASE   HOLMIUM LASER APPLICATION N/A 07/01/2020   Procedure: HOLMIUM LASER OF BLADDER STONE;  Surgeon: Devere Lonni Righter, MD;  Location: [facility];  Service: Urology;  Laterality: N/A;   INCISION AND DRAINAGE OF WOUND N/A 11/04/2013   Procedure: IRRIGATION AND DEBRIDEMENT ABDOMINAL WOUND WITH PLACEMENT OF ACELL/VAC;  Surgeon: Estefana Reichert, DO;  Location: [facility];  Service: Plastics;  Laterality: N/A;   INSERTION OF SUPRAPUBIC CATHETER N/A 12/23/2019   Procedure: FLEXIBLE CYSTOSCOPE WITH FOLEY PLACEMENT OVER WIRE.;  Surgeon: Elisabeth Valli JONETTA, MD;  Location: [facility];  Service: Urology;  Laterality: N/A;   INSERTION OF SUPRAPUBIC CATHETER N/A 07/26/2021   Procedure: EXCHANGE SUPRAPUBIC CATHETER AMD BOTOX  INJECTION 300 UNITS;  Surgeon: Carolee Sherwood JONETTA DOUGLAS, MD;  Location: [facility];  Service: Urology;  Laterality: N/A;   INSERTION OF SUPRAPUBIC CATHETER N/A 06/02/2023   Procedure: SUPRAPUBIC CATHETER CHANGE;  Surgeon: Carolee Sherwood JONETTA DOUGLAS, MD;  Location: [facility];  Service: Urology;  Laterality: N/A;   IR CATHETER TUBE CHANGE  02/15/2019   IR CATHETER TUBE CHANGE  03/01/2019   IR CATHETER TUBE CHANGE  05/06/2019   IR CATHETER TUBE CHANGE  03/06/2020   IR CATHETER TUBE CHANGE  04/16/2020   IR CATHETER TUBE CHANGE  04/29/2020   ORCHIOPEXY Bilateral AS CHILD   UNDESCENDED TESTIS   RIGHT COLECTOMY/  APPENDECTOMY/  ILEOSTOMY  NOV 2014  [facility]   surgical complication with cholecystectomy   UMBILICAL HERNIA REPAIR  child   VENTRICULOPERITONEAL SHUNT  11-12-2018 per pt and mother---LAST REVISION ~ 06/2015   DUE TO  ABD WOUND--- SHUNT DRAINS TO HEART (12/23/2015)   Social History:  reports that he has never smoked. He has never been exposed to tobacco smoke. He has never used smokeless tobacco. He reports that he does not drink alcohol and does not use drugs.  Allergies  Allergen Reactions   Latex Hives   Succinylcholine  Rash and Other (See Comments)    Pt is half [place] - prolonged paralysis due to compromised metabolism. Succinylcholine  given on 07/07/15 at outside hospital and pt experienced paralysis > 3 hours.   Due to being native [place], his mother states that he takes longer to come out of it  Pt is half [place] - prolonged paralysis due to compromised metabolism. Succinylcholine  given on 07/07/15 at outside hospital and pt experienced paralysis > 3 hours.   Ciprofloxacin Other (See Comments)    IV only-- caused burning in arm    Vancomycin Itching and Swelling   Adhesive [Tape] Hives and Rash   Wound Dressing Adhesive Hives and Rash  Family History  Problem Relation Age of Onset   Diabetes Other    Hypertension Other    Cancer Other    Stroke Other     Prior to Admission medications   Medication Sig Start Date End Date Taking? Authorizing Provider  acetaminophen  (TYLENOL ) 650 MG CR tablet Take 1,300 mg by mouth every 8 (eight) hours as needed for pain.    [provider]  Ascorbic Acid (VITAMIN C ) 1000 MG tablet Take 1,000 mg by mouth daily with lunch.    [provider]  calcium  elemental as carbonate (BARIATRIC TUMS ULTRA) 400 MG chewable tablet Chew 2,000 mg by mouth 3 (three) times daily after meals.    [provider]  Cranberry-Vitamin C -Vitamin E 4200-20-3 MG-MG-UNIT CAPS Take 2 capsules by mouth in the morning, at noon, and at bedtime.    [provider]  KLOR-CON  M20 20 MEQ tablet Take 20 mEq by mouth daily. 03/14/23   [provider]  levETIRAcetam  (KEPPRA ) 500 MG tablet Take 500 mg by mouth 2 (two) times daily.     [provider]  liver oil-zinc oxide (DESITIN) 40 % ointment Apply 1 Application topically as needed for irritation.    [provider]  metoprolol  tartrate (LOPRESSOR ) 50 MG tablet Take 50 mg by mouth 2 (two) times daily.    [provider]  Multiple Minerals-Vitamins (CALCIUM -MAGNESIUM -ZINC-D3) TABS Take 1 tablet by mouth in the morning and at bedtime.    [provider]  Multiple Vitamin (MULTIVITAMIN WITH MINERALS) TABS tablet Take 1 tablet by mouth daily in the afternoon.    [provider]  nystatin cream (MYCOSTATIN) Apply 1 Application topically daily as needed for dry skin. 05/01/23   [provider]  ondansetron  (ZOFRAN ) 4 MG tablet Take 4 mg by mouth every 8 (eight) hours as needed for nausea or vomiting. 03/14/23   [provider]  oxybutynin  (DITROPAN ) 5 MG tablet Take 5 mg by mouth 2 (two) times daily. 10/03/19   [provider]  polyethylene glycol (MIRALAX  / GLYCOLAX ) 17 g packet Take 17 g by mouth daily. 08/17/23   Bryn Bernardino NOVAK, MD  rosuvastatin  (CRESTOR ) 20 MG tablet Take 20 mg by mouth in the morning. 09/10/18   [provider]    Physical Exam: Vitals:   06/28/24 1136 06/28/24 1430 06/28/24 1616  BP: 108/73 119/80 112/70  Pulse: 78 71 79  Resp: 18  18  Temp: (!) 97.4 F (36.3 C)  (!) 97.5 F (36.4 C)  TempSrc: Oral  Oral  SpO2: 97% 98% 97%  Weight: 73.5 kg    Height: 5' (1.524 m)     Physical Exam:  General: Chronically ill appearing, obese, no acute distress HEENT: Normocephalic, atraumatic, PERRL Cardiovascular: Normal rate and rhythm.  Pulmonary: Normal pulmonary effort, normal breath sounds Gastrointestinal: Distended abdomen, tender in LUQ and suprapubic area, bowel sounds absent, abdominal wall abnormality from healed scars from previous surgeries Musculoskeletal: The patient has spina bifida and is paraplegic. His lower extremities are flaccid, shortened and externally rotated.   No pitting edema of lower legs. Skin: Skin is warm and dry. Neuro: No focal deficits noted, AAOx3. PSYCH: Attentive and cooperative  Data Reviewed:  Results for orders placed or performed during the hospital encounter of 06/28/24 (from the past 24 hours)  CBC with Differential     Status: Abnormal   Collection Time: 06/28/24 12:03 PM  Result Value Ref Range   WBC 19.5 (H) 4.0 - 10.5 K/uL  RBC 7.13 (H) 4.22 - 5.81 MIL/uL   Hemoglobin 17.3 (H) 13.0 - 17.0 g/dL   HCT 44.6 (H) 60.9 - 47.9 %   MCV 77.6 (L) 80.0 - 100.0 fL   MCH 24.3 (L) 26.0 - 34.0 pg   MCHC 31.3 30.0 - 36.0 g/dL   RDW 85.3 88.4 - 84.4 %   Platelets 402 (H) 150 - 400 K/uL   nRBC 0.0 0.0 - 0.2 %   Neutrophils Relative % 81 %   Neutro Abs 15.9 (H) 1.7 - 7.7 K/uL   Lymphocytes Relative 10 %   Lymphs Abs 1.9 0.7 - 4.0 K/uL   Monocytes Relative 8 %   Monocytes Absolute 1.5 (H) 0.1 - 1.0 K/uL   Eosinophils Relative 0 %   Eosinophils Absolute 0.0 0.0 - 0.5 K/uL   Basophils Relative 0 %   Basophils Absolute 0.0 0.0 - 0.1 K/uL   Immature Granulocytes 1 %   Abs Immature Granulocytes 0.16 (H) 0.00 - 0.07 K/uL  Comprehensive metabolic panel     Status: Abnormal   Collection Time: 06/28/24 12:03 PM  Result Value Ref Range   Sodium 133 (L) 135 - 145 mmol/L   Potassium 4.2 3.5 - 5.1 mmol/L   Chloride 87 (L) 98 - 111 mmol/L   CO2 29 22 - 32 mmol/L   Glucose, Bld 150 (H) 70 - 99 mg/dL   BUN 55 (H) 6 - 20 mg/dL   Creatinine, Ser 9.06 0.61 - 1.24 mg/dL   Calcium  9.9 8.9 - 10.3 mg/dL   Total Protein 7.9 6.5 - 8.1 g/dL   Albumin  4.0 3.5 - 5.0 g/dL   AST 32 15 - 41 U/L   ALT 64 (H) 0 - 44 U/L   Alkaline Phosphatase 102 38 - 126 U/L   Total Bilirubin 0.6 0.0 - 1.2 mg/dL   GFR, Estimated >39 >39 mL/min   Anion gap 17 (H) 5 - 15  Lipase, blood     Status: None   Collection Time: 06/28/24 12:03 PM  Result Value Ref Range   Lipase 16 11 - 51 U/L  I-stat chem 8, ED (not at [facility], [facility] or [facility])     Status: Abnormal   Collection  Time: 06/28/24 12:13 PM  Result Value Ref Range   Sodium 133 (L) 135 - 145 mmol/L   Potassium 3.9 3.5 - 5.1 mmol/L   Chloride 91 (L) 98 - 111 mmol/L   BUN 61 (H) 6 - 20 mg/dL   Creatinine, Ser 8.69 (H) 0.61 - 1.24 mg/dL   Glucose, Bld 839 (H) 70 - 99 mg/dL   Calcium , Ion 1.09 (L) 1.15 - 1.40 mmol/L   TCO2 34 (H) 22 - 32 mmol/L   Hemoglobin 19.0 (H) 13.0 - 17.0 g/dL   HCT 43.9 (H) 60.9 - 47.9 %  I-Stat CG4 Lactic Acid     Status: Abnormal   Collection Time: 06/28/24 12:14 PM  Result Value Ref Range   Lactic Acid, Venous 2.9 (HH) 0.5 - 1.9 mmol/L   Comment NOTIFIED PHYSICIAN   Urinalysis, Routine w reflex microscopic -Urine, Clean Catch     Status: Abnormal   Collection Time: 06/28/24  1:40 PM  Result Value Ref Range   Color, Urine AMBER (A) YELLOW   APPearance HAZY (A) CLEAR   Specific Gravity, Urine 1.021 1.005 - 1.030   pH 5.0 5.0 - 8.0   Glucose, UA NEGATIVE NEGATIVE mg/dL   Hgb urine dipstick NEGATIVE NEGATIVE   Bilirubin Urine NEGATIVE NEGATIVE  Ketones, ur NEGATIVE NEGATIVE mg/dL   Protein, ur 899 (A) NEGATIVE mg/dL   Nitrite NEGATIVE NEGATIVE   Leukocytes,Ua MODERATE (A) NEGATIVE   RBC / HPF 6-10 0 - 5 RBC/hpf   WBC, UA 21-50 0 - 5 WBC/hpf   Bacteria, UA MANY (A) NONE SEEN   Squamous Epithelial / HPF 0-5 0 - 5 /HPF   Mucus PRESENT    Hyaline Casts, UA PRESENT    Amorphous Crystal PRESENT    Ca Oxalate Crys, UA PRESENT   I-Stat CG4 Lactic Acid     Status: Abnormal   Collection Time: 06/28/24  2:33 PM  Result Value Ref Range   Lactic Acid, Venous 2.7 (HH) 0.5 - 1.9 mmol/L   Comment NOTIFIED PHYSICIAN    CT of abdomen/pelvis 1. Diffuse distension of the stomach and small bowel through the level of ileostomy, with multiple gas fluid levels identified as above. Findings could reflect developing distal obstruction just proximal to the ileostomy, versus ileus. Please correlate with ileostomy output. 2. No CT findings of bowel ischemia. 3. Other chronic findings as  above.    Assessment and Plan: Ileus versus developing bowel obstruction - the patient says he last used his finger to dilate his stoma this morning but it did not have any effect.  He will do it again tonight.  - General Surgery has been consulted - IV fluids, monitoring - Consider NGT - The patient was initially put in the sepsis pathway because of his elevated white blood cell count and elevated lactic acid.  If his CT does not show any sign of infection so I do not plan to continue his antibiotics at this time.  2. AKI - IV fluids.  Follow creatinine  Med rec not yet completed.   Advance Care Planning:   Code Status: Prior  The patient is full code and he names his mother as a Runner, broadcasting/film/video.  Consults: General surgery  Family Communication: None  Severity of Illness: The appropriate patient status for this patient is INPATIENT. Inpatient status is judged to be reasonable and necessary in order to provide the required intensity of service to ensure the patient's safety. The patient's presenting symptoms, physical exam findings, and initial radiographic and laboratory data in the context of their chronic comorbidities is felt to place them at high risk for further clinical deterioration. Furthermore, it is not anticipated that the patient will be medically stable for discharge from the hospital within 2 midnights of admission.   * I certify that at the point of admission it is my clinical judgment that the patient will require inpatient hospital care spanning beyond 2 midnights from the point of admission due to high intensity of service, high risk for further deterioration and high frequency of surveillance required.*  Author: ARTHEA CHILD, MD 06/28/2024 6:16 PM  For on call review [email or web address].

## 2024-06-28 NOTE — Progress Notes (Signed)
   06/28/24 2352  BiPAP/CPAP/SIPAP  $ Non-Invasive Home Ventilator  Initial  BiPAP/CPAP/SIPAP Pt Type Adult  BiPAP/CPAP/SIPAP Resmed (bipap)  Mask Type Full face mask  Dentures removed? Not applicable  Mask Size Medium  IPAP 20 cmH20  EPAP 4 cmH2O  Patient Home Machine No  Patient Home Mask No  Patient Home Tubing No  Auto Titrate No  CPAP/SIPAP surface wiped down Yes  Device Plugged into [employer] Power Outlet Yes

## 2024-06-29 ENCOUNTER — Inpatient Hospital Stay (HOSPITAL_COMMUNITY)

## 2024-06-29 DIAGNOSIS — N319 Neuromuscular dysfunction of bladder, unspecified: Secondary | ICD-10-CM | POA: Diagnosis not present

## 2024-06-29 DIAGNOSIS — K567 Ileus, unspecified: Secondary | ICD-10-CM | POA: Diagnosis not present

## 2024-06-29 DIAGNOSIS — Z982 Presence of cerebrospinal fluid drainage device: Secondary | ICD-10-CM | POA: Diagnosis not present

## 2024-06-29 DIAGNOSIS — Q059 Spina bifida, unspecified: Secondary | ICD-10-CM | POA: Diagnosis not present

## 2024-06-29 LAB — BASIC METABOLIC PANEL WITH GFR
Anion gap: 13 (ref 5–15)
Anion gap: 10 (ref 5–15)
BUN: 31 mg/dL — ABNORMAL HIGH (ref 6–20)
BUN: 23 mg/dL — ABNORMAL HIGH (ref 6–20)
CO2: 29 mmol/L (ref 22–32)
CO2: 31 mmol/L (ref 22–32)
Calcium: 9.6 mg/dL (ref 8.9–10.3)
Calcium: 9.6 mg/dL (ref 8.9–10.3)
Chloride: 93 mmol/L — ABNORMAL LOW (ref 98–111)
Chloride: 94 mmol/L — ABNORMAL LOW (ref 98–111)
Creatinine, Ser: 0.6 mg/dL — ABNORMAL LOW (ref 0.61–1.24)
Creatinine, Ser: 0.37 mg/dL — ABNORMAL LOW (ref 0.61–1.24)
GFR, Estimated: >60 mL/min (ref >60)
GFR, Estimated: >60 mL/min (ref >60)
Glucose, Bld: 120 mg/dL — ABNORMAL HIGH (ref 70–99)
Glucose, Bld: 95 mg/dL (ref 70–99)
Potassium: 4 mmol/L (ref 3.5–5.1)
Potassium: 3.7 mmol/L (ref 3.5–5.1)
Sodium: 134 mmol/L — ABNORMAL LOW (ref 135–145)
Sodium: 135 mmol/L (ref 135–145)

## 2024-06-29 LAB — CBC
HCT: 49.5 % (ref 39.0–52.0)
HCT: 48.7 % (ref 39.0–52.0)
Hemoglobin: 15.6 g/dL (ref 13.0–17.0)
Hemoglobin: 15.2 g/dL (ref 13.0–17.0)
MCH: 25.4 pg — ABNORMAL LOW (ref 26.0–34.0)
MCH: 24.9 pg — ABNORMAL LOW (ref 26.0–34.0)
MCHC: 31.5 g/dL (ref 30.0–36.0)
MCHC: 31.2 g/dL (ref 30.0–36.0)
MCV: 80.5 fL (ref 80.0–100.0)
MCV: 79.8 fL — ABNORMAL LOW (ref 80.0–100.0)
Platelets: 275 K/uL (ref 150–400)
Platelets: 279 K/uL (ref 150–400)
RBC: 6.15 MIL/uL — ABNORMAL HIGH (ref 4.22–5.81)
RBC: 6.1 MIL/uL — ABNORMAL HIGH (ref 4.22–5.81)
RDW: 13.5 % (ref 11.5–15.5)
RDW: 13.6 % (ref 11.5–15.5)
WBC: 15.4 K/uL — ABNORMAL HIGH (ref 4.0–10.5)
WBC: 13.5 K/uL — ABNORMAL HIGH (ref 4.0–10.5)
nRBC: 0 % (ref 0.0–0.2)
nRBC: 0 % (ref 0.0–0.2)

## 2024-06-29 LAB — LACTIC ACID, PLASMA
Lactic Acid, Venous: 3.3 mmol/L (ref 0.5–1.9)
Lactic Acid, Venous: 1.5 mmol/L (ref 0.5–1.9)

## 2024-06-29 LAB — MAGNESIUM: Magnesium: 2 mg/dL (ref 1.7–2.4)

## 2024-06-29 MED ORDER — OXYBUTYNIN CHLORIDE 5 MG PO TABS
5.0000 mg | ORAL_TABLET | Freq: Two times a day (BID) | ORAL | Status: DC
  Administered 2024-06-29 – 2024-06-30 (×2): 5 mg via ORAL
  Filled 2024-06-29 (×2): qty 1

## 2024-06-29 MED ORDER — ROSUVASTATIN CALCIUM 10 MG PO TABS
20.0000 mg | ORAL_TABLET | Freq: Every day | ORAL | Status: DC
  Administered 2024-06-29: 20 mg via ORAL
  Filled 2024-06-29: qty 2

## 2024-06-29 MED ORDER — VERAPAMIL HCL 120 MG PO TABS
120.0000 mg | ORAL_TABLET | Freq: Every day | ORAL | Status: DC
Start: 2024-06-29 — End: 2024-06-30
  Administered 2024-06-29: 120 mg via ORAL
  Filled 2024-06-29: qty 1

## 2024-06-29 MED ORDER — METOPROLOL TARTRATE 50 MG PO TABS
50.0000 mg | ORAL_TABLET | Freq: Two times a day (BID) | ORAL | Status: DC
  Administered 2024-06-29 – 2024-06-30 (×2): 50 mg via ORAL
  Filled 2024-06-29 (×2): qty 1

## 2024-06-29 MED ORDER — LEVETIRACETAM (KEPPRA) 500 MG/5 ML ADULT IV PUSH
500.0000 mg | Freq: Two times a day (BID) | INTRAVENOUS | Status: DC
  Administered 2024-06-29 (×2): 500 mg via INTRAVENOUS
  Filled 2024-06-29 (×4): qty 5

## 2024-06-29 NOTE — Progress Notes (Signed)
 PROGRESS NOTE    [name]  FMW:[id number] DOB: [birth year] DOA: 06/28/2024 PCP: Haze Kingfisher, MD   Brief Narrative:  [name] is a 43 y.o. male with medical history significant for spina bifida/congenital paraplegia, hydrocephalus-s/p VP shunt, neurogenic bladder with suprapubic pubic catheter, complicated abdominal surgery history s/p hemicolectomy with ileostomy who presented with abdominal distention and less output from his ileostomy site.  The patient reports that his mother noticed that his belly was much more distended.  He denies any pain or discomfort.  His ileostomy is still putting out just less than usual. He does feel dehydrated. Nausea but no vomiting.  The patient's last hospitalization was 8 months ago for the exact same thing.  The patient uses his finger to manually dilate his stoma and that seemed to help resolve the issues last time.  **Interim History General Surgery was consulted and he was given IV fluid hydration.  Ileus is now slowly resolving and he is having more output.  Surgery recommends conservative care and have advance his diet to a clear liquid diet for now. In the interim will change his Suprapubic Catheter.   Assessment and Plan:  Ileus versus developing bowel obstruction, however bowel obstruction less likely: The patient says he last used his finger to dilate his stoma but it did not have any effect and will do it again. CT Scan Abd/Pelvis done and showed Diffuse distension of the stomach and small bowel through the level of ileostomy, with multiple gas fluid levels identified as above which could reflect developing distal obstruction just proximal to the ileostomy, versus ileus. Please correlate with ileostomy output and No CT findings of bowel ischemia. -General Surgery has been consulted and feel he has an ileus. S/p IV fluids. NGT not necessary. -WBC Trending down and went from 19.5 -> 15.4 -> 13.5. The patient was initially put in the  sepsis pathway because of his elevated white blood cell count and elevated lactic acid. Since CT does not show any sign of infection, will not to continue his antibiotics at this time. -Blood Cx x2 showed NGTD <12 Hours -Repeat KUB done and showed: Persistent dilatation of bowel loops in the lower abdomen.  -General Surgery allowing for CLD now    AKI: BUN/Cr Trend: Recent Labs  Lab 06/28/24 1203 06/28/24 1213 06/29/24 0033 06/29/24 0503  BUN 55* 61* 31* 23*  CREATININE 0.93 1.30* 0.60* 0.37*  -S/p 3.25 Liter bolus and then is also s/p LR @ 150 mL/hr x 20 hours -Avoid Nephrotoxic Medications, Contrast Dyes, Hypotension and Dehydration to Ensure Adequate Renal Perfusion and will need to Renally Adjust Meds. CTM and Trend Renal Function carefully and repeat CMP in the AM   Lactic Acidosis: In the setting of Dehydration. S/p IVF as above  Seizure Disorder: C/w Levetiracetam  500 mg q12h and change to po when tolerating Diet. Seizure Precautions.   Bacteruria: Urinalysis showed hazy appearance with amber-colored urine, moderate leukocytes, negative nitrites, many bacteria, 6-10 RBCs per high-power field, 21-50 WBCs with urine culture showing:   Abnormal  >=100,000 COLONIES/mL CITROBACTER FREUNDII 20,000 COLONIES/mL PROVIDENCIA RETTGERI CULTURE REINCUBATED FOR BETTER GROWTH  -Clinically do noe suspect Infection but suspect Colonization given Hx of Suprapubic Catheter  Decreased UOP and Decreased Ostomy output in the setting of Dehydration : S/p IVF Hydration; Improving. 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   HLD: C/w Rosuvastatin  20 mg po at bedtime  Essential HTN: C/w Metoprolol  Tartrate 50 mg po BID and Verapamil  120 mg po at bedtime. CTM BP per Protocol.  Last BP reading was 127/77  Hx of Migraines: C/w Verapamil  120 mg po qHS  Class I Obesity: Complicates overall prognosis and care. Estimated body mass index is 30.7 kg/m as calculated from the following:   Height as of this encounter: 5' (1.524 m).   Weight as of this encounter: 71.3 kg. Weight Loss and Dietary Counseling given   DVT prophylaxis: enoxaparin  (LOVENOX ) injection 40 mg Start: 06/28/24 2200    Code Status: Full Code Family Communication: D/w Mother @ Bedside  Disposition Plan:  Level of care: Med-Surg Status is: Inpatient Remains inpatient appropriate because: Needs further clinical improvement of his ileus and clearance by specialist   Consultants:  General Surgery   Procedures:  As delineated as above  Antimicrobials:  Anti-infectives (From admission, onward)    Start     Dose/Rate Route Frequency Ordered Stop   06/28/24 1300  ceFEPIme  (MAXIPIME ) 2 g in sodium chloride  0.9 % 100 mL IVPB        2 g 200 mL/hr over 30 Minutes Intravenous  Once 06/28/24 1252 06/28/24 1424   06/28/24 1300  metroNIDAZOLE  (FLAGYL ) IVPB 500 mg        500 mg 100 mL/hr over 60 Minutes Intravenous  Once 06/28/24 1252 06/28/24 1555       Subjective: Seen and examined at bedside and is doing better and denied any abdominal discomfort.  He said his belly is softer now.  No nausea or vomiting.  Having output in his ostomy.  Denied any lightheadedness or dizziness.  No other complaints at this time.  Objective: Vitals:   06/28/24 2330 06/29/24 0404 06/29/24 0905 06/29/24 1247  BP: 112/74 110/80 117/76 127/77  Pulse: 83 (!) 103 (!) 110 (!) 109  Resp:   18 18  Temp: 97.7 F (36.5 C) 98.3 F (36.8 C) 98.2 F (36.8 C) (!) 97.5 F (36.4 C)  TempSrc: Oral Oral Oral Oral  SpO2: 100% 99% 100% 92%  Weight:      Height:        Intake/Output Summary (Last 24 hours) at 06/29/2024 1520 Last data filed at 06/29/2024 0325 Gross per 24 hour  Intake 2998.41 ml  Output 1325 ml  Net 1673.41  ml   Filed Weights   06/28/24 1136 06/28/24 1945  Weight: 73.5 kg 71.3 kg   Examination: Physical Exam:  Constitutional: WN/WD obese chronically ill-appearing male with spina bifida and paraplegia no acute distress Respiratory: Diminished to auscultation bilaterally, no wheezing, rales, rhonchi or crackles. Normal respiratory effort and patient is not tachypenic. No accessory muscle use.  Unlabored breathing Cardiovascular: RRR, no murmurs / rubs / gallops. S1 and S2 auscultated.  Mild 1+ extremity edema Abdomen: Soft, non-tender, distended secondary body habitus and ileostomy in place with some drainage and air.. Bowel sounds positive.  GU: Suprapubic Catheter in place Musculoskeletal: His lower extremities are smaller and atrophied in the setting of his spina bifida and paraplegia Skin: No rashes, lesions, ulcers. No induration; Warm and dry.  Neurologic: CN 2-12 grossly intact with no focal deficits. Psychiatric: Normal judgment and insight. Alert and oriented x 3. Normal mood and appropriate affect.   Data Reviewed: I have personally reviewed following labs and imaging studies  CBC: Recent Labs  Lab 06/28/24 1203 06/28/24 1213 06/29/24  0033 06/29/24 0503  WBC 19.5*  --  15.4* 13.5*  NEUTROABS 15.9*  --   --   --   HGB 17.3* 19.0* 15.6 15.2  HCT 55.3* 56.0* 49.5 48.7  MCV 77.6*  --  80.5 79.8*  PLT 402*  --  275 279   Basic Metabolic Panel: Recent Labs  Lab 06/28/24 1203 06/28/24 1213 06/29/24 0033 06/29/24 0503  NA 133* 133* 134* 135  K 4.2 3.9 4.0 3.7  CL 87* 91* 93* 94*  CO2 29  --  29 31  GLUCOSE 150* 160* 120* 95  BUN 55* 61* 31* 23*  CREATININE 0.93 1.30* 0.60* 0.37*  CALCIUM  9.9  --  9.6 9.6  MG  --   --  2.0  --    GFR: Estimated Creatinine Clearance: 100.5 mL/min (A) (by C-G formula based on SCr of 0.37 mg/dL (L)). Liver Function Tests: Recent Labs  Lab 06/28/24 1203  AST 32  ALT 64*  ALKPHOS 102  BILITOT 0.6  PROT 7.9  ALBUMIN  4.0   Recent  Labs  Lab 06/28/24 1203  LIPASE 16   No results for input(s): AMMONIA in the last 168 hours. Coagulation Profile: No results for input(s): INR, PROTIME in the last 168 hours. Cardiac Enzymes: No results for input(s): CKTOTAL, CKMB, CKMBINDEX, TROPONINI in the last 168 hours. BNP (last 3 results) No results for input(s): PROBNP in the last 8760 hours. HbA1C: No results for input(s): HGBA1C in the last 72 hours. CBG: No results for input(s): GLUCAP in the last 168 hours. Lipid Profile: No results for input(s): CHOL, HDL, LDLCALC, TRIG, CHOLHDL, LDLDIRECT in the last 72 hours. Thyroid  Function Tests: No results for input(s): TSH, T4TOTAL, FREET4, T3FREE, THYROIDAB in the last 72 hours. Anemia Panel: No results for input(s): VITAMINB12, FOLATE, FERRITIN, TIBC, IRON, RETICCTPCT in the last 72 hours. Sepsis Labs: Recent Labs  Lab 06/28/24 1841 06/28/24 2106 06/29/24 0033 06/29/24 0503  LATICACIDVEN 1.9 3.2* 3.3* 1.5   Recent Results (from the past 240 hours)  Blood culture (routine x 2)     Status: None (Preliminary result)   Collection Time: 06/28/24 12:15 PM   Specimen: BLOOD  Result Value Ref Range Status   Specimen Description   Final    BLOOD RIGHT ANTECUBITAL Performed at [facility], [address] [place], [place] [address]    Special Requests   Final    BOTTLES DRAWN AEROBIC AND ANAEROBIC Blood Culture adequate volume Performed at [facility], [address] [place], [place] [address]    Culture   Final    NO GROWTH < 24 HOURS Performed at [facility], [address] [place], [place] [address]    Report Status PENDING  Incomplete  Urine Culture     Status: Abnormal (Preliminary result)   Collection Time: 06/28/24  2:12 PM   Specimen: Urine, Clean Catch  Result Value Ref Range Status   Specimen Description   Final    URINE, CLEAN CATCH Performed at [facility], [address] [place], [place] [address]    Special Requests   Final    NONE Performed at [facility], [address], [place], [place] [address]    Culture (A)  Final    >=100,000 COLONIES/mL CITROBACTER FREUNDII 20,000 COLONIES/mL PROVIDENCIA RETTGERI CULTURE REINCUBATED FOR BETTER GROWTH Performed at [facility], [address], [place], [place] [address]    Report Status PENDING  Incomplete  Blood culture (routine x 2)     Status: None (Preliminary result)   Collection Time: 06/28/24  9:06 PM   Specimen: BLOOD RIGHT HAND  Result Value Ref Range Status   Specimen Description   Final    BLOOD RIGHT HAND Performed at [facility], [address] [place], [place] [address]    Special Requests   Final    BOTTLES DRAWN AEROBIC ONLY Blood Culture results may not be optimal due to an inadequate volume of blood received in culture bottles Performed at [facility], [address], [place], [place] [address]    Culture   Final    NO GROWTH < 12 HOURS Performed at [facility], [address] [place], [place] [address]    Report Status PENDING  Incomplete    Radiology Studies: DG Abd 1 View Result Date: 06/29/2024 CLINICAL DATA:  Abdominal distension EXAM: ABDOMEN - 1 VIEW COMPARISON:  Radiograph dated 08/15/2023. FINDINGS: Dilated bowel in the right lower quadrant measure up to 6 cm. No free air. Spinal fusion hardware. IMPRESSION: Persistent dilatation of bowel loops in the lower abdomen. Electronically Signed   By: Vanetta Chou M.D.   On: 06/29/2024 14:11   CT Angio Abd/Pel W and/or Wo Contrast Result Date: 06/28/2024 CLINICAL DATA:  Mesenteric ischemia, acute elevated lactic, multiple abd surgeies, bloating with abdominal pain, leukocytosis, illeostomy with decrease output EXAM: CTA ABDOMEN AND PELVIS WITHOUT AND WITH CONTRAST TECHNIQUE: Multidetector CT imaging of the abdomen and pelvis was  performed using the standard protocol during bolus administration of intravenous contrast. Multiplanar reconstructed images and MIPs were obtained and reviewed to evaluate the vascular anatomy. RADIATION DOSE REDUCTION: This exam was performed according to the departmental dose-optimization program which includes automated exposure control, adjustment of the mA and/or kV according to patient size and/or use of iterative reconstruction technique. CONTRAST:  OMNIPAQUE  IOHEXOL  350 MG/ML SOLN COMPARISON:  11/23/2023 FINDINGS: VASCULAR Aorta: Normal caliber aorta without aneurysm, dissection, vasculitis or significant stenosis. Celiac: Patent without evidence of aneurysm, dissection, vasculitis or significant stenosis. SMA: Patent without evidence of aneurysm, dissection, vasculitis or significant stenosis. Renals: Both renal arteries are patent without evidence of aneurysm, dissection, vasculitis, fibromuscular dysplasia or significant stenosis. IMA: Patent without evidence of aneurysm, dissection, vasculitis or significant stenosis. Inflow: Patent without evidence of aneurysm, dissection, vasculitis or significant stenosis. Proximal Outflow: Bilateral common femoral and visualized portions of the superficial and profunda femoral arteries are patent without evidence of aneurysm, dissection, vasculitis or significant stenosis. Veins: No obvious venous abnormality within the limitations of this arterial phase study. Review of the MIP images confirms the above findings. NON-VASCULAR Lower chest: No acute pleural or parenchymal lung disease. Hepatobiliary: No focal liver abnormality is seen. Status post cholecystectomy. No biliary dilatation. Pancreas: Unremarkable. No pancreatic ductal dilatation or surrounding inflammatory changes. Spleen: Normal in size without focal abnormality. Adrenals/Urinary Tract: The kidneys enhance normally and symmetrically. No urinary tract calculi. The adrenals are unremarkable.  Suprapubic catheter decompresses the urinary bladder, which limits evaluation of the bladder. 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 There is diffuse bowel dilatation extending from the stomach through the ileostomy, with maximal dimension of the jejunum measuring 7 cm in diameter. Multiple gas fluid levels are seen within the stomach and distended small bowel to the level of the ileostomy. Findings are compatible with developing distal obstruction just proximal to the ileostomy versus ileus. Please correlate with ileostomy output. There is no pneumatosis, bowel  wall thickening, or inflammatory change to suggest underlying bowel ischemia. The distal colon is decompressed without acute finding. Lymphatic: No pathologic adenopathy. Reproductive: Prostate is unremarkable. Other: No free fluid or free intraperitoneal gas. Wide diastasis of the rectus musculature unchanged. Musculoskeletal: Ventriculostomy catheter terminates in the right upper quadrant anterior abdominal wall. 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 No acute displaced fractures. Reconstructed images demonstrate no additional findings. IMPRESSION: VASCULAR 1. No evidence of mesenteric ischemia. 2. No acute or significant vascular findings. NON-VASCULAR 1. Diffuse distension of the stomach and small bowel through the level of ileostomy, with multiple gas fluid levels identified as above. Findings could reflect developing distal obstruction just proximal to the ileostomy, versus ileus. Please correlate with ileostomy output. 2. No CT findings of bowel ischemia. 3. Other chronic findings as above. Electronically Signed   By: Ozell Daring M.D.   On: 06/28/2024 16:52   Scheduled Meds:  enoxaparin  (LOVENOX ) injection  40 mg Subcutaneous Q24H   levETIRAcetam   500 mg Intravenous Q12H   metoprolol  tartrate  50 mg Oral BID   ondansetron  (ZOFRAN ) IV  4 mg Intravenous Once    oxybutynin   5 mg Oral BID   rosuvastatin   20 mg Oral QHS   verapamil   120 mg Oral QHS   Continuous Infusions:   LOS: 1 day   Alejandro Marker, DO [facility] Available via Epic secure chat 7am-7pm After these hours, please refer to coverage provider listed on [email or web address] 06/29/2024, 3:20 PM

## 2024-06-29 NOTE — Hospital Course (Addendum)
 [name] is a 43 y.o. male with medical history significant for spina bifida/congenital paraplegia, hydrocephalus-s/p VP shunt, neurogenic bladder with suprapubic pubic catheter, complicated abdominal surgery history s/p hemicolectomy with ileostomy who presented with abdominal distention and less output from his ileostomy site.  The patient reports that his mother noticed that his belly was much more distended.  He denies any pain or discomfort.  His ileostomy is still putting out just less than usual. He does feel dehydrated. Nausea but no vomiting.  The patient's last hospitalization was 8 months ago for the exact same thing.  The patient uses his finger to manually dilate his stoma and that seemed to help resolve the issues last time.  **Interim History General Surgery was consulted and he was given IV fluid hydration.  Ileus is now slowly resolving and he is having more output.  Surgery recommends conservative care and have advance his diet to a clear liquid diet for now. In the interim will change his Suprapubic Catheter.   Assessment and Plan:  Ileus versus developing bowel obstruction, however bowel obstruction less likely: The patient says he last used his finger to dilate his stoma but it did not have any effect and will do it again. CT Scan Abd/Pelvis done and showed Diffuse distension of the stomach and small bowel through the level of ileostomy, with multiple gas fluid levels identified as above which could reflect developing distal obstruction just proximal to the ileostomy, versus ileus. Please correlate with ileostomy output and No CT findings of bowel ischemia. -General Surgery has been consulted and feel he has an ileus. S/p IV fluids. NGT not necessary. -WBC Trending down and went from 19.5 -> 15.4 -> 13.5. The patient was initially put in the sepsis pathway because of his elevated white blood cell count and elevated lactic acid. Since CT does not show any sign of infection, will  not to continue his antibiotics at this time. -Blood Cx x2 showed NGTD <12 Hours -Repeat KUB done and showed: Persistent dilatation of bowel loops in the lower abdomen.  -General Surgery allowing for CLD now    AKI: BUN/Cr Trend: Recent Labs  Lab 06/28/24 1203 06/28/24 1213 06/29/24 0033 06/29/24 0503  BUN 55* 61* 31* 23*  CREATININE 0.93 1.30* 0.60* 0.37*  -S/p 3.25 Liter bolus and then is also s/p LR @ 150 mL/hr x 20 hours -Avoid Nephrotoxic Medications, Contrast Dyes, Hypotension and Dehydration to Ensure Adequate Renal Perfusion and will need to Renally Adjust Meds. CTM and Trend Renal Function carefully and repeat CMP in the AM   Lactic Acidosis: In the setting of Dehydration. S/p IVF as above  Seizure Disorder: C/w Levetiracetam  500 mg q12h and change to po when tolerating Diet. Seizure Precautions.   Bacteruria: Urinalysis showed hazy appearance with amber-colored urine, moderate leukocytes, negative nitrites, many bacteria, 6-10 RBCs per high-power field, 21-50 WBCs with urine culture showing:   Abnormal  >=100,000 COLONIES/mL CITROBACTER FREUNDII 20,000 COLONIES/mL PROVIDENCIA RETTGERI CULTURE REINCUBATED FOR BETTER GROWTH  -Clinically do noe suspect Infection but suspect Colonization given Hx of Suprapubic Catheter  Decreased UOP and Decreased Ostomy output in the setting of Dehydration : S/p IVF Hydration; Improving. 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   HLD: C/w Rosuvastatin  20 mg po at bedtime  Essential HTN: C/w  Metoprolol  Tartrate 50 mg po BID and Verapamil  120 mg po at bedtime. CTM BP per Protocol. Last BP reading was 127/77  Hx of Migraines: C/w Verapamil  120 mg po qHS  Class I Obesity: Complicates overall prognosis and care.  Estimated body mass index is 30.7 kg/m as calculated from the following:   Height as of this encounter: 5' (1.524 m).   Weight as of this encounter: 71.3 kg. Weight Loss and Dietary Counseling given

## 2024-06-29 NOTE — Consult Note (Signed)
 Reason for Consult:ab pain Referring Physician: Dr Arthea Deward [name] is an 43 y.o. male.  HPI: 16 yom with history of paraplegia, colectomy and ileostomy in past presents with abdominal distention and pain.  He has history of needing to dilate his ileostomy and they use dilators at home as well as digitally. They have not had to do this in a while.  He presents yesterday with distention and decreased ileostomy output.  He had some nausea.  He had a ct scan that shows dilation of small bowel up to ileostomy with some air fluid levels.  By the time I see him he has fair amount of ileostomy output and is doing better. No complaints of pain or nausea.   Past Medical History:  Diagnosis Date   Arnold-Chiari malformation (HCC)    Congenital paraplegia (HCC)    MID-ABDOMIN DOWN  S/P SPINA BIFIDA   GERD (gastroesophageal reflux disease)    Heart palpitations    SECONDARY TO VP SHUNT DRAIN   History of kidney stones    History of seizure    per pt mother, no seizures since age 16   Hypertension    Ileostomy in place [facility]) 2014   Migraines followed by neurologist @[facility]    taking verapamil    Neurogenic bladder    INCONTINENT -   Neurogenic bowel    S/P VP shunt    Spina bifida with hydrocephalus, lumbar region (HCC)    CONGENITAL--   VP SHUNT in place--  PARALYZIED MID ABDOMEN DOWN   Urinary incontinence with continuous leakage    Wears glasses    Wheelchair bound    CAN TRASFER SELF WITHOUT BOARD    Past Surgical History:  Procedure Laterality Date   BACK SURGERY  infant   get skin to stretch over his back; spinal bifida   BACK SURGERY  age 12   scoliosis and  removed hump on his back (kyphosis) and put rods in to stabalize back   BOTOX  INJECTION N/A 11/14/2018   Procedure: BOTOX  INJECTION CYSTOSCOPY;  Surgeon: Carolee Sherwood JONETTA DOUGLAS, MD;  Location: [facility];  Service: Urology;  Laterality: N/A;   BOTOX  INJECTION N/A 05/22/2019   Procedure: BOTOX  INJECTION;  Surgeon: Carolee Sherwood JONETTA DOUGLAS, MD;  Location: [facility];  Service: Urology;  Laterality: N/A;   BOTOX  INJECTION N/A 11/13/2019   Procedure: CYSTOSCOPY BOTOX  INJECTION;  Surgeon: Carolee Sherwood JONETTA DOUGLAS, MD;  Location: [facility];  Service: Urology;  Laterality: N/A;   BOTOX  INJECTION N/A 07/01/2020   Procedure: CYSTOSCOPY, BOTOX  INJECTION, BLADDER FULGERATION, SUPRAPUBIC TUBE EXCHANGE;  Surgeon: Devere Lonni Righter, MD;  Location: [facility];  Service: Urology;  Laterality: N/A;   BOTOX  INJECTION N/A 12/14/2020   Procedure: BOTOX  INJECTION 300 UNITS SUPRAPUBIC TUBE CHANGE;  Surgeon: Carolee Sherwood JONETTA DOUGLAS, MD;  Location: [facility];  Service: Urology;  Laterality: N/A;   BOTOX  INJECTION N/A 06/02/2023   Procedure: CYSTOSCOPY BOTOX  INJECTION 200 UNITS;  Surgeon: Carolee Sherwood JONETTA DOUGLAS, MD;  Location: [facility];  Service: Urology;  Laterality: N/A;  1 HR FOR CASE   CHOLECYSTECTOMY N/A 07/06/2013   Procedure: LAPAROSCOPIC CHOLECYSTECTOMY WITH INTRAOPERATIVE CHOLANGIOGRAM;  Surgeon: Redell Faith, DO;  Location: [facility];  Service: General;  Laterality: N/A;   COLON RESECTION N/A 07/08/2013   Procedure: EXPLORATORY LAPAROSCOPY, DIAGNOSTIC LAPAROSCOPY, PARTIAL COLECTOMY, ABDOMINAL WASHOUT, ABDOMINAL WOUND VAC;  Surgeon: Redell Faith, DO;  Location: [facility];  Service: General;  Laterality: N/A;   CYSTOSCOPY WITH INJECTION N/A 02/19/2024   Procedure:  CYSTOSCOPY, WITH INJECTION OF BLADDER NECK OR BLADDER WALL;  Surgeon: Carolee Sherwood JONETTA DOUGLAS, MD;  Location: [facility];  Service: Urology;  Laterality: N/A;  200 UNITS BOTOX    CYSTOSCOPY WITH LITHOLAPAXY N/A 05/09/2022   Procedure: CYSTOSCOPY WITH LITHOLAPAXY, BOTOX  INJECTION;  Surgeon: Carolee Sherwood JONETTA DOUGLAS, MD;  Location: [facility];  Service: Urology;  Laterality: N/A;   HIP SURGERY Bilateral AS CHILD   TENDON RELEASE   HOLMIUM LASER APPLICATION N/A 07/01/2020   Procedure: HOLMIUM LASER OF BLADDER STONE;  Surgeon: Devere Lonni Righter, MD;  Location: [facility];  Service: Urology;  Laterality: N/A;   INCISION AND DRAINAGE OF  WOUND N/A 11/04/2013   Procedure: IRRIGATION AND DEBRIDEMENT ABDOMINAL WOUND WITH PLACEMENT OF ACELL/VAC;  Surgeon: Estefana Reichert, DO;  Location: [facility];  Service: Plastics;  Laterality: N/A;   INSERTION OF SUPRAPUBIC CATHETER N/A 12/23/2019   Procedure: FLEXIBLE CYSTOSCOPE WITH FOLEY PLACEMENT OVER WIRE.;  Surgeon: Elisabeth Valli JONETTA, MD;  Location[facility];  Service: Urology;  Laterality: N/A;   INSERTION OF SUPRAPUBIC CATHETER N/A 07/26/2021   Procedure: EXCHANGE SUPRAPUBIC CATHETER AMD BOTOX  INJECTION 300 UNITS;  Surgeon: Carolee Sherwood JONETTA DOUGLAS, MD;  [facility];  Service: Urology;  Laterality: N/A;   INSERTION OF SUPRAPUBIC CATHETER N/A 06/02/2023   Procedure: SUPRAPUBIC CATHETER CHANGE;  Surgeon: Carolee Sherwood JONETTA DOUGLAS, MD;  Location[facility];  Service: Urology;  Laterality: N/A;   IR CATHETER TUBE CHANGE  02/15/2019   IR CATHETER TUBE CHANGE  03/01/2019   IR CATHETER TUBE CHANGE  05/06/2019   IR CATHETER TUBE CHANGE  03/06/2020   IR CATHETER TUBE CHANGE  04/16/2020   IR CATHETER TUBE CHANGE  04/29/2020   ORCHIOPEXY Bilateral AS CHILD   UNDESCENDED TESTIS   RIGHT COLECTOMY/  APPENDECTOMY/  ILEOSTOMY  NOV 2014  [facility]   surgical complication with cholecystectomy   UMBILICAL HERNIA REPAIR  child   VENTRICULOPERITONEAL SHUNT  11-12-2018 per pt and mother---LAST REVISION ~ 06/2015   DUE TO ABD WOUND--- SHUNT DRAINS TO HEART (12/23/2015)    Family History  Problem Relation Age of Onset   Diabetes Other    Hypertension Other    Cancer Other    Stroke Other     Social History:  reports that he has never smoked. He has never been exposed to tobacco smoke. He has never used smokeless tobacco. He reports that he does not drink alcohol and does not use drugs.  Allergies:  Allergies  Allergen Reactions   Latex Hives   Succinylcholine  Rash and Other (See Comments)    Pt is half [place] - prolonged paralysis due to compromised metabolism. Succinylcholine  given on 07/07/15 at  outside hospital and pt experienced paralysis > 3 hours.   Due to being native [place], his mother states that he takes longer to come out of it   Ciprofloxacin Other (See Comments)    IV only-- caused burning in arm    Vancomycin Itching and Swelling   Adhesive [Tape] Hives and Rash   Wound Dressing Adhesive Hives and Rash    Medications: I have reviewed the patient's current medications.  Results for orders placed or performed during the hospital encounter of 06/28/24 (from the past 48 hours)  CBC with Differential     Status: Abnormal   Collection Time: 06/28/24 12:03 PM  Result Value Ref Range   WBC 19.5 (H) 4.0 - 10.5 K/uL   RBC 7.13 (H) 4.22 - 5.81 MIL/uL   Hemoglobin 17.3 (  H) 13.0 - 17.0 g/dL   HCT 44.6 (H) 60.9 - 47.9 %   MCV 77.6 (L) 80.0 - 100.0 fL   MCH 24.3 (L) 26.0 - 34.0 pg   MCHC 31.3 30.0 - 36.0 g/dL   RDW 85.3 88.4 - 84.4 %   Platelets 402 (H) 150 - 400 K/uL   nRBC 0.0 0.0 - 0.2 %   Neutrophils Relative % 81 %   Neutro Abs 15.9 (H) 1.7 - 7.7 K/uL   Lymphocytes Relative 10 %   Lymphs Abs 1.9 0.7 - 4.0 K/uL   Monocytes Relative 8 %   Monocytes Absolute 1.5 (H) 0.1 - 1.0 K/uL   Eosinophils Relative 0 %   Eosinophils Absolute 0.0 0.0 - 0.5 K/uL   Basophils Relative 0 %   Basophils Absolute 0.0 0.0 - 0.1 K/uL   Immature Granulocytes 1 %   Abs Immature Granulocytes 0.16 (H) 0.00 - 0.07 K/uL    Comment: Performed at [facility], [address], [place], [place] [address]  Comprehensive metabolic panel     Status: Abnormal   Collection Time: 06/28/24 12:03 PM  Result Value Ref Range   Sodium 133 (L) 135 - 145 mmol/L   Potassium 4.2 3.5 - 5.1 mmol/L    Comment: HEMOLYSIS AT THIS LEVEL MAY AFFECT RESULT   Chloride 87 (L) 98 - 111 mmol/L   CO2 29 22 - 32 mmol/L   Glucose, Bld 150 (H) 70 - 99 mg/dL    Comment: Glucose reference range applies only to samples taken after fasting for at least 8 hours.   BUN 55 (H) 6 - 20 mg/dL   Creatinine,  Ser 9.06 0.61 - 1.24 mg/dL   Calcium  9.9 8.9 - 10.3 mg/dL   Total Protein 7.9 6.5 - 8.1 g/dL   Albumin  4.0 3.5 - 5.0 g/dL   AST 32 15 - 41 U/L    Comment: HEMOLYSIS AT THIS LEVEL MAY AFFECT RESULT   ALT 64 (H) 0 - 44 U/L   Alkaline Phosphatase 102 38 - 126 U/L   Total Bilirubin 0.6 0.0 - 1.2 mg/dL   GFR, Estimated >39 >39 mL/min    Comment: (NOTE) Calculated using the CKD-EPI Creatinine Equation (2021)    Anion gap 17 (H) 5 - 15    Comment: Performed at [facility], [address], [place], [place] [address]  Lipase, blood     Status: None   Collection Time: 06/28/24 12:03 PM  Result Value Ref Range   Lipase 16 11 - 51 U/L    Comment: Performed at [facility], [address] [place], [place] [address]  I-stat chem 8, ED (not at [facility], [facility] or [facility])     Status: Abnormal   Collection Time: 06/28/24 12:13 PM  Result Value Ref Range   Sodium 133 (L) 135 - 145 mmol/L   Potassium 3.9 3.5 - 5.1 mmol/L   Chloride 91 (L) 98 - 111 mmol/L   BUN 61 (H) 6 - 20 mg/dL   Creatinine, Ser 8.69 (H) 0.61 - 1.24 mg/dL   Glucose, Bld 839 (H) 70 - 99 mg/dL    Comment: Glucose reference range applies only to samples taken after fasting for at least 8 hours.   Calcium , Ion 1.09 (L) 1.15 - 1.40 mmol/L   TCO2 34 (H) 22 - 32 mmol/L   Hemoglobin 19.0 (H) 13.0 - 17.0 g/dL   HCT 43.9 (H) 60.9 - 47.9 %  I-Stat CG4 Lactic Acid  Status: Abnormal   Collection Time: 06/28/24 12:14 PM  Result Value Ref Range   Lactic Acid, Venous 2.9 (HH) 0.5 - 1.9 mmol/L   Comment NOTIFIED PHYSICIAN   Blood culture (routine x 2)     Status: None (Preliminary result)   Collection Time: 06/28/24 12:15 PM   Specimen: BLOOD  Result Value Ref Range   Specimen Description      BLOOD RIGHT ANTECUBITAL Performed at [facility], [address], [place], [place] [address]    Special Requests      BOTTLES DRAWN AEROBIC AND ANAEROBIC Blood Culture adequate  volume Performed at [facility], [address] [place], [place] [address]    Culture      NO GROWTH < 24 HOURS Performed at [facility], [address], [place], [place] [address]    Report Status PENDING   Urinalysis, Routine w reflex microscopic -Urine, Clean Catch     Status: Abnormal   Collection Time: 06/28/24  1:40 PM  Result Value Ref Range   Color, Urine AMBER (A) YELLOW    Comment: BIOCHEMICALS MAY BE AFFECTED BY COLOR   APPearance HAZY (A) CLEAR   Specific Gravity, Urine 1.021 1.005 - 1.030   pH 5.0 5.0 - 8.0   Glucose, UA NEGATIVE NEGATIVE mg/dL   Hgb urine dipstick NEGATIVE NEGATIVE   Bilirubin Urine NEGATIVE NEGATIVE   Ketones, ur NEGATIVE NEGATIVE mg/dL   Protein, ur 899 (A) NEGATIVE mg/dL   Nitrite NEGATIVE NEGATIVE   Leukocytes,Ua MODERATE (A) NEGATIVE   RBC / HPF 6-10 0 - 5 RBC/hpf   WBC, UA 21-50 0 - 5 WBC/hpf   Bacteria, UA MANY (A) NONE SEEN   Squamous Epithelial / HPF 0-5 0 - 5 /HPF   Mucus PRESENT    Hyaline Casts, UA PRESENT    Amorphous Crystal PRESENT    Ca Oxalate Crys, UA PRESENT     Comment: Performed at [facility], [address], [place], [place] [address]  I-Stat CG4 Lactic Acid     Status: Abnormal   Collection Time: 06/28/24  2:33 PM  Result Value Ref Range   Lactic Acid, Venous 2.7 (HH) 0.5 - 1.9 mmol/L   Comment NOTIFIED PHYSICIAN   Lactic acid, plasma     Status: None   Collection Time: 06/28/24  6:41 PM  Result Value Ref Range   Lactic Acid, Venous 1.9 0.5 - 1.9 mmol/L    Comment: Performed at [facility], [address] [place], [place] [address]  Blood culture (routine x 2)     Status: None (Preliminary result)   Collection Time: 06/28/24  9:06 PM   Specimen: BLOOD RIGHT HAND  Result Value Ref Range   Specimen Description      BLOOD RIGHT HAND Performed at [facility], [address] [place], [place] [address]    Special Requests      BOTTLES DRAWN  AEROBIC ONLY Blood Culture results may not be optimal due to an inadequate volume of blood received in culture bottles Performed at [facility], [address], [place], [place] [address]    Culture      NO GROWTH < 12 HOURS Performed at [facility], [address], [place], [place] [address]    Report Status PENDING   Lactic acid, plasma     Status: Abnormal   Collection Time: 06/28/24  9:06 PM  Result Value Ref Range  Lactic Acid, Venous 3.2 (HH) 0.5 - 1.9 mmol/L    Comment: Critical Value, Read Back and verified with Julieanne NOVAK RN @ [phone] 06/28/24 CAL Performed at [facility], [address], [place], [place] [address]   Magnesium      Status: None   Collection Time: 06/29/24 12:33 AM  Result Value Ref Range   Magnesium  2.0 1.7 - 2.4 mg/dL    Comment: Performed at [facility], [address] [place], [place] [address]  Basic metabolic panel     Status: Abnormal   Collection Time: 06/29/24 12:33 AM  Result Value Ref Range   Sodium 134 (L) 135 - 145 mmol/L   Potassium 4.0 3.5 - 5.1 mmol/L   Chloride 93 (L) 98 - 111 mmol/L   CO2 29 22 - 32 mmol/L   Glucose, Bld 120 (H) 70 - 99 mg/dL    Comment: Glucose reference range applies only to samples taken after fasting for at least 8 hours.   BUN 31 (H) 6 - 20 mg/dL   Creatinine, Ser 9.39 (L) 0.61 - 1.24 mg/dL   Calcium  9.6 8.9 - 10.3 mg/dL   GFR, Estimated >39 >39 mL/min    Comment: (NOTE) Calculated using the CKD-EPI Creatinine Equation (2021)    Anion gap 13 5 - 15    Comment: Performed at [facility], [address], [place], [place] [address]  CBC     Status: Abnormal   Collection Time: 06/29/24 12:33 AM  Result Value Ref Range   WBC 15.4 (H) 4.0 - 10.5 K/uL   RBC 6.15 (H) 4.22 - 5.81 MIL/uL   Hemoglobin 15.6 13.0 - 17.0 g/dL   HCT 50.4 60.9 - 47.9 %   MCV 80.5 80.0 - 100.0 fL   MCH 25.4 (L) 26.0 - 34.0 pg   MCHC 31.5 30.0 - 36.0 g/dL    RDW 86.4 88.4 - 84.4 %   Platelets 275 150 - 400 K/uL   nRBC 0.0 0.0 - 0.2 %    Comment: Performed at [facility], [address] [place], [place] [address]  Lactic acid, plasma     Status: Abnormal   Collection Time: 06/29/24 12:33 AM  Result Value Ref Range   Lactic Acid, Venous 3.3 (HH) 0.5 - 1.9 mmol/L    Comment: Critical Value, Read Back and verified with Clifton NOVAK RN @ [phone] 06/29/24 CAL Performed at [facility], [address], [place], [place] [address]   Lactic acid, plasma     Status: None   Collection Time: 06/29/24  5:03 AM  Result Value Ref Range   Lactic Acid, Venous 1.5 0.5 - 1.9 mmol/L    Comment: Performed at [facility], [address], [place], [place] [address]  CBC     Status: Abnormal   Collection Time: 06/29/24  5:03 AM  Result Value Ref Range   WBC 13.5 (H) 4.0 - 10.5 K/uL   RBC 6.10 (H) 4.22 - 5.81 MIL/uL   Hemoglobin 15.2 13.0 - 17.0 g/dL   HCT 51.2 60.9 - 47.9 %   MCV 79.8 (L) 80.0 - 100.0 fL   MCH 24.9 (L) 26.0 - 34.0 pg   MCHC 31.2 30.0 - 36.0 g/dL   RDW 86.3 88.4 - 84.4 %   Platelets 279 150 - 400 K/uL   nRBC 0.0 0.0 - 0.2 %    Comment: Performed at [facility], [address] [place], [place]  [address]  Basic metabolic panel     Status: Abnormal   Collection Time: 06/29/24  5:03 AM  Result Value Ref Range   Sodium 135 135 - 145 mmol/L   Potassium 3.7 3.5 - 5.1 mmol/L   Chloride 94 (L) 98 - 111 mmol/L   CO2 31 22 - 32 mmol/L   Glucose, Bld 95 70 - 99 mg/dL    Comment: Glucose reference range applies only to samples taken after fasting for at least 8 hours.   BUN 23 (H) 6 - 20 mg/dL   Creatinine, Ser 9.62 (L) 0.61 - 1.24 mg/dL   Calcium  9.6 8.9 - 10.3 mg/dL   GFR, Estimated >39 >39 mL/min    Comment: (NOTE) Calculated using the CKD-EPI Creatinine Equation (2021)    Anion gap 10 5 - 15    Comment: Performed at [facility], [address], [place], [place] [address]    CT Angio Abd/Pel W and/or Wo Contrast Result Date: 06/28/2024 CLINICAL DATA:  Mesenteric ischemia, acute elevated lactic, multiple abd surgeies, bloating with abdominal pain, leukocytosis, illeostomy with decrease output EXAM: CTA ABDOMEN AND PELVIS WITHOUT AND WITH CONTRAST TECHNIQUE: Multidetector CT imaging of the abdomen and pelvis was performed using the standard protocol during bolus administration of intravenous contrast. Multiplanar reconstructed images and MIPs were obtained and reviewed to evaluate the vascular anatomy. RADIATION DOSE REDUCTION: This exam was performed according to the departmental dose-optimization program which includes automated exposure control, adjustment of the mA and/or kV according to patient size and/or use of iterative reconstruction technique. CONTRAST:  OMNIPAQUE  IOHEXOL  350 MG/ML SOLN COMPARISON:  11/23/2023 FINDINGS: VASCULAR Aorta: Normal caliber aorta without aneurysm, dissection, vasculitis or significant stenosis. Celiac: Patent without evidence of aneurysm, dissection, vasculitis or significant stenosis. SMA: Patent without evidence of aneurysm, dissection, vasculitis or significant stenosis. Renals: Both renal arteries are patent without evidence of aneurysm, dissection, vasculitis, fibromuscular dysplasia or significant stenosis. IMA: Patent without evidence of aneurysm, dissection, vasculitis or significant stenosis. Inflow: Patent without evidence of aneurysm, dissection, vasculitis or significant stenosis. Proximal Outflow: Bilateral common femoral and visualized portions of the superficial and profunda femoral arteries are patent without evidence of aneurysm, dissection, vasculitis or significant stenosis. Veins: No obvious venous abnormality within the limitations of this arterial phase study. Review of the MIP images confirms the above findings. NON-VASCULAR Lower chest: No acute pleural or parenchymal lung disease.  Hepatobiliary: No focal liver abnormality is seen. Status post cholecystectomy. No biliary dilatation. Pancreas: Unremarkable. No pancreatic ductal dilatation or surrounding inflammatory changes. Spleen: Normal in size without focal abnormality. Adrenals/Urinary Tract: The kidneys enhance normally and symmetrically. No urinary tract calculi. The adrenals are unremarkable. Suprapubic catheter decompresses the urinary bladder, which limits evaluation of the bladder. 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 There is diffuse bowel dilatation extending from the stomach through the ileostomy, with maximal dimension of the jejunum measuring 7 cm in diameter. Multiple gas fluid levels are seen within the stomach and distended small bowel to the level of the ileostomy. Findings are compatible with developing distal obstruction just proximal to the ileostomy versus ileus. Please correlate with ileostomy output. There is no pneumatosis, bowel wall thickening, or inflammatory change to suggest underlying bowel ischemia. The distal colon is decompressed without acute finding. Lymphatic: No pathologic adenopathy. Reproductive: Prostate is unremarkable. Other: No free fluid or free intraperitoneal gas. Wide diastasis of the rectus musculature unchanged. Musculoskeletal: Ventriculostomy catheter terminates in the right upper quadrant anterior abdominal wall.  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 No acute displaced fractures. Reconstructed images demonstrate no additional findings. IMPRESSION: VASCULAR 1. No evidence of mesenteric ischemia. 2. No acute or significant vascular findings. NON-VASCULAR 1. Diffuse distension of the stomach and small bowel through the level of ileostomy, with multiple gas fluid levels identified as above. Findings could reflect developing distal obstruction just proximal to the ileostomy, versus ileus. Please correlate with  ileostomy output. 2. No CT findings of bowel ischemia. 3. Other chronic findings as above. Electronically Signed   By: Ozell Daring M.D.   On: 06/28/2024 16:52    Review of Systems  Gastrointestinal:  Negative for abdominal distention, abdominal pain and nausea.   Blood pressure 127/77, pulse (!) 109, temperature (!) 97.5 F (36.4 C), temperature source Oral, resp. rate 18, height 5' (1.524 m), weight 71.3 kg, SpO2 92%. Physical Exam Vitals reviewed.  Abdominal:     Comments: Well healed incision, ileostomy rlq with quite a bit of bilious output in the bag connected to it, soft not really distended.  Neurological:     Mental Status: He is alert.     Assessment/Plan: Ileus -likely ileus that is resolving -they are well aware about dilating ileostomy and this really hasn't been an issue for some time -can have clears for now and see how he does. -pharm dvt prophylaxis is fine with me -no indication for surgery  I have reviewed ct scan showing likely ileus, vitals, discussed with medical provider  Donnice Bury 06/29/2024, 1:03 PM

## 2024-06-29 NOTE — Plan of Care (Signed)

## 2024-06-30 ENCOUNTER — Inpatient Hospital Stay (HOSPITAL_COMMUNITY)

## 2024-06-30 DIAGNOSIS — K567 Ileus, unspecified: Secondary | ICD-10-CM | POA: Diagnosis not present

## 2024-06-30 DIAGNOSIS — Q059 Spina bifida, unspecified: Secondary | ICD-10-CM | POA: Diagnosis not present

## 2024-06-30 DIAGNOSIS — Z982 Presence of cerebrospinal fluid drainage device: Secondary | ICD-10-CM | POA: Diagnosis not present

## 2024-06-30 DIAGNOSIS — N319 Neuromuscular dysfunction of bladder, unspecified: Secondary | ICD-10-CM | POA: Diagnosis not present

## 2024-06-30 LAB — CBC WITH DIFFERENTIAL/PLATELET
Abs Immature Granulocytes: 0.06 K/uL (ref 0.00–0.07)
Basophils Absolute: 0 K/uL (ref 0.0–0.1)
Basophils Relative: 0 %
Eosinophils Absolute: 0.1 K/uL (ref 0.0–0.5)
Eosinophils Relative: 1 %
HCT: 46.6 % (ref 39.0–52.0)
Hemoglobin: 14.1 g/dL (ref 13.0–17.0)
Immature Granulocytes: 1 %
Lymphocytes Relative: 27 %
Lymphs Abs: 2.2 K/uL (ref 0.7–4.0)
MCH: 24.6 pg — ABNORMAL LOW (ref 26.0–34.0)
MCHC: 30.3 g/dL (ref 30.0–36.0)
MCV: 81.2 fL (ref 80.0–100.0)
Monocytes Absolute: 1 K/uL (ref 0.1–1.0)
Monocytes Relative: 12 %
Neutro Abs: 4.9 K/uL (ref 1.7–7.7)
Neutrophils Relative %: 59 %
Platelets: 225 K/uL (ref 150–400)
RBC: 5.74 MIL/uL (ref 4.22–5.81)
RDW: 13.5 % (ref 11.5–15.5)
WBC: 8.3 K/uL (ref 4.0–10.5)
nRBC: 0 % (ref 0.0–0.2)

## 2024-06-30 LAB — COMPREHENSIVE METABOLIC PANEL WITH GFR
ALT: 27 U/L (ref 0–44)
AST: 28 U/L (ref 15–41)
Albumin: 3.3 g/dL — ABNORMAL LOW (ref 3.5–5.0)
Alkaline Phosphatase: 80 U/L (ref 38–126)
Anion gap: 13 (ref 5–15)
BUN: 12 mg/dL (ref 6–20)
CO2: 27 mmol/L (ref 22–32)
Calcium: 9 mg/dL (ref 8.9–10.3)
Chloride: 90 mmol/L — ABNORMAL LOW (ref 98–111)
Creatinine, Ser: 0.42 mg/dL — ABNORMAL LOW (ref 0.61–1.24)
GFR, Estimated: >60 mL/min (ref >60)
Glucose, Bld: 79 mg/dL (ref 70–99)
Potassium: 4.6 mmol/L (ref 3.5–5.1)
Sodium: 130 mmol/L — ABNORMAL LOW (ref 135–145)
Total Bilirubin: 0.8 mg/dL (ref 0.0–1.2)
Total Protein: 6.6 g/dL (ref 6.5–8.1)

## 2024-06-30 LAB — MAGNESIUM: Magnesium: 2 mg/dL (ref 1.7–2.4)

## 2024-06-30 LAB — PHOSPHORUS: Phosphorus: 3.4 mg/dL (ref 2.5–4.6)

## 2024-06-30 MED ORDER — LEVETIRACETAM 500 MG PO TABS
500.0000 mg | ORAL_TABLET | Freq: Two times a day (BID) | ORAL | Status: DC
Start: 2024-06-30 — End: 2024-06-30
  Administered 2024-06-30: 500 mg via ORAL
  Filled 2024-06-30: qty 1

## 2024-06-30 MED ORDER — SODIUM CHLORIDE 0.9 % IV SOLN: INTRAVENOUS | Status: DC

## 2024-06-30 MED ORDER — ONDANSETRON HCL 4 MG PO TABS: 4.0000 mg | ORAL_TABLET | Freq: Three times a day (TID) | ORAL | 0 refills | Status: AC | PRN

## 2024-06-30 NOTE — Plan of Care (Signed)

## 2024-06-30 NOTE — Plan of Care (Signed)

## 2024-06-30 NOTE — Progress Notes (Signed)
 Subjective/Chief Complaint: Tol liquids, a lot out of ileostomy, feels back to normal   Objective: Vital signs in last 24 hours: Temp:  [97.5 F (36.4 C)-99.1 F (37.3 C)] 97.6 F (36.4 C) (10/19 0504) Pulse Rate:  [66-110] 66 (10/19 0504) Resp:  [18] 18 (10/19 0504) BP: (117-130)/(71-77) 130/71 (10/19 0504) SpO2:  [92 %-100 %] 98 % (10/19 0504) FiO2 (%):  [21 %] 21 % (10/19 0036) Last BM Date : 06/29/24  Intake/Output from previous day: 10/18 0701 - 10/19 0700 In: -  Out: 2150 [Urine:950; Stool:1200] Intake/Output this shift: No intake/output data recorded.  Ab soft nontender ileostomy with bilious output  Lab Results:  Recent Labs    06/29/24 0503 06/30/24 0654  WBC 13.5* 8.3  HGB 15.2 14.1  HCT 48.7 46.6  PLT 279 225   BMET Recent Labs    06/29/24 0503 06/30/24 0654  NA 135 130*  K 3.7 4.6  CL 94* 90*  CO2 31 27  GLUCOSE 95 79  BUN 23* 12  CREATININE 0.37* 0.42*  CALCIUM  9.6 9.0   PT/INR No results for input(s): LABPROT, INR in the last 72 hours. ABG No results for input(s): PHART, HCO3 in the last 72 hours.  Invalid input(s): PCO2, PO2  Studies/Results: DG Abd 1 View Result Date: 06/30/2024 CLINICAL DATA:  Ileus EXAM: ABDOMEN - 1 VIEW COMPARISON:  06/29/2024 FINDINGS: Similar gas-filled dilated bowel loops in the right lower quadrant measuring up to 6.1 cm today, stable. Bones are diffusely demineralized. Thoracolumbar spinal fusion hardware evident with chronic fracture of the left-sided rod. IMPRESSION: Similar gas-filled dilated bowel loops in the right lower quadrant. Electronically Signed   By: Camellia Candle M.D.   On: 06/30/2024 07:15   DG Abd 1 View Result Date: 06/29/2024 CLINICAL DATA:  Abdominal distension EXAM: ABDOMEN - 1 VIEW COMPARISON:  Radiograph dated 08/15/2023. FINDINGS: Dilated bowel in the right lower quadrant measure up to 6 cm. No free air. Spinal fusion hardware. IMPRESSION: Persistent dilatation of bowel  loops in the lower abdomen. Electronically Signed   By: Vanetta Chou M.D.   On: 06/29/2024 14:11   CT Angio Abd/Pel W and/or Wo Contrast Result Date: 06/28/2024 CLINICAL DATA:  Mesenteric ischemia, acute elevated lactic, multiple abd surgeies, bloating with abdominal pain, leukocytosis, illeostomy with decrease output EXAM: CTA ABDOMEN AND PELVIS WITHOUT AND WITH CONTRAST TECHNIQUE: Multidetector CT imaging of the abdomen and pelvis was performed using the standard protocol during bolus administration of intravenous contrast. Multiplanar reconstructed images and MIPs were obtained and reviewed to evaluate the vascular anatomy. RADIATION DOSE REDUCTION: This exam was performed according to the departmental dose-optimization program which includes automated exposure control, adjustment of the mA and/or kV according to patient size and/or use of iterative reconstruction technique. CONTRAST:  OMNIPAQUE  IOHEXOL  350 MG/ML SOLN COMPARISON:  11/23/2023 FINDINGS: VASCULAR Aorta: Normal caliber aorta without aneurysm, dissection, vasculitis or significant stenosis. Celiac: Patent without evidence of aneurysm, dissection, vasculitis or significant stenosis. SMA: Patent without evidence of aneurysm, dissection, vasculitis or significant stenosis. Renals: Both renal arteries are patent without evidence of aneurysm, dissection, vasculitis, fibromuscular dysplasia or significant stenosis. IMA: Patent without evidence of aneurysm, dissection, vasculitis or significant stenosis. Inflow: Patent without evidence of aneurysm, dissection, vasculitis or significant stenosis. Proximal Outflow: Bilateral common femoral and visualized portions of the superficial and profunda femoral arteries are patent without evidence of aneurysm, dissection, vasculitis or significant stenosis. Veins: No obvious venous abnormality within the limitations of this arterial phase study. Review of the  MIP images confirms the above findings.  NON-VASCULAR Lower chest: No acute pleural or parenchymal lung disease. Hepatobiliary: No focal liver abnormality is seen. Status post cholecystectomy. No biliary dilatation. Pancreas: Unremarkable. No pancreatic ductal dilatation or surrounding inflammatory changes. Spleen: Normal in size without focal abnormality. Adrenals/Urinary Tract: The kidneys enhance normally and symmetrically. No urinary tract calculi. The adrenals are unremarkable. Suprapubic catheter decompresses the urinary bladder, which limits evaluation of the bladder. 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 There is diffuse bowel dilatation extending from the stomach through the ileostomy, with maximal dimension of the jejunum measuring 7 cm in diameter. Multiple gas fluid levels are seen within the stomach and distended small bowel to the level of the ileostomy. Findings are compatible with developing distal obstruction just proximal to the ileostomy versus ileus. Please correlate with ileostomy output. There is no pneumatosis, bowel wall thickening, or inflammatory change to suggest underlying bowel ischemia. The distal colon is decompressed without acute finding. Lymphatic: No pathologic adenopathy. Reproductive: Prostate is unremarkable. Other: No free fluid or free intraperitoneal gas. Wide diastasis of the rectus musculature unchanged. Musculoskeletal: Ventriculostomy catheter terminates in the right upper quadrant anterior abdominal wall. 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 No acute displaced fractures. Reconstructed images demonstrate no additional findings. IMPRESSION: VASCULAR 1. No evidence of mesenteric ischemia. 2. No acute or significant vascular findings. NON-VASCULAR 1. Diffuse distension of the stomach and small bowel through the level of ileostomy, with multiple gas fluid levels identified as above. Findings could reflect developing distal obstruction  just proximal to the ileostomy, versus ileus. Please correlate with ileostomy output. 2. No CT findings of bowel ischemia. 3. Other chronic findings as above. Electronically Signed   By: Ozell Daring M.D.   On: 06/28/2024 16:52    Anti-infectives: Anti-infectives (From admission, onward)    Start     Dose/Rate Route Frequency Ordered Stop   06/28/24 1300  ceFEPIme  (MAXIPIME ) 2 g in sodium chloride  0.9 % 100 mL IVPB        2 g 200 mL/hr over 30 Minutes Intravenous  Once 06/28/24 1252 06/28/24 1424   06/28/24 1300  metroNIDAZOLE  (FLAGYL ) IVPB 500 mg        500 mg 100 mL/hr over 60 Minutes Intravenous  Once 06/28/24 1252 06/28/24 1555       Assessment/Plan: Ileus vs sbo at ileostomy -resolved, no surgery needed think more ileus -soft diet -will  sign off   Donnice Bury 06/30/2024

## 2024-06-30 NOTE — Progress Notes (Signed)
   06/30/24 0036  BiPAP/CPAP/SIPAP  $ Non-Invasive Home Ventilator  Subsequent  BiPAP/CPAP/SIPAP Pt Type Adult  BiPAP/CPAP/SIPAP Resmed (bipap)  Mask Type Full face mask  Dentures removed? Not applicable  Mask Size Medium  IPAP 20 cmH20  EPAP 4 cmH2O  FiO2 (%) 21 %  Patient Home Machine No  Patient Home Mask No  Patient Home Tubing No  Auto Titrate No  Device Plugged into RED Power Outlet Yes

## 2024-06-30 NOTE — Plan of Care (Signed)
  Problem: Education: Goal: Knowledge of General Education information will improve Description: Including pain rating scale, medication(s)/side effects and non-pharmacologic comfort measures 06/30/2024 0702 by Eliane Vola HERO, RN Outcome: Progressing 06/30/2024 0701 by Eliane Vola HERO, RN Outcome: Progressing   Problem: Health Behavior/Discharge Planning: Goal: Ability to manage health-related needs will improve 06/30/2024 0702 by Eliane Vola HERO, RN Outcome: Progressing 06/30/2024 0701 by Eliane Vola HERO, RN Outcome: Progressing   Problem: Clinical Measurements: Goal: Ability to maintain clinical measurements within normal limits will improve 06/30/2024 0702 by Eliane Vola HERO, RN Outcome: Progressing 06/30/2024 0701 by Eliane Vola HERO, RN Outcome: Progressing Goal: Will remain free from infection 06/30/2024 0702 by Eliane Vola HERO, RN Outcome: Progressing 06/30/2024 0701 by Eliane Vola HERO, RN Outcome: Progressing Goal: Diagnostic test results will improve 06/30/2024 0702 by Eliane Vola HERO, RN Outcome: Progressing 06/30/2024 0701 by Eliane Vola HERO, RN Outcome: Progressing Goal: Respiratory complications will improve 06/30/2024 0702 by Eliane Vola HERO, RN Outcome: Progressing 06/30/2024 0701 by Eliane Vola HERO, RN Outcome: Progressing Goal: Cardiovascular complication will be avoided 06/30/2024 0702 by Eliane Vola HERO, RN Outcome: Progressing 06/30/2024 0701 by Eliane Vola HERO, RN Outcome: Progressing   Problem: Activity: Goal: Risk for activity intolerance will decrease 06/30/2024 0702 by Eliane Vola HERO, RN Outcome: Progressing 06/30/2024 0701 by Eliane Vola HERO, RN Outcome: Progressing   Problem: Nutrition: Goal: Adequate nutrition will be maintained 06/30/2024 0702 by Eliane Vola HERO, RN Outcome: Progressing 06/30/2024 0701 by Eliane Vola HERO, RN Outcome: Progressing   Problem: Coping: Goal: Level of anxiety will  decrease 06/30/2024 0702 by Eliane Vola HERO, RN Outcome: Progressing 06/30/2024 0701 by Eliane Vola HERO, RN Outcome: Progressing   Problem: Elimination: Goal: Will not experience complications related to bowel motility 06/30/2024 0702 by Eliane Vola HERO, RN Outcome: Progressing 06/30/2024 0701 by Eliane Vola HERO, RN Outcome: Progressing Goal: Will not experience complications related to urinary retention 06/30/2024 0702 by Eliane Vola HERO, RN Outcome: Progressing 06/30/2024 0701 by Eliane Vola HERO, RN Outcome: Progressing   Problem: Pain Managment: Goal: General experience of comfort will improve and/or be controlled 06/30/2024 0702 by Eliane Vola HERO, RN Outcome: Progressing 06/30/2024 0701 by Eliane Vola HERO, RN Outcome: Progressing   Problem: Safety: Goal: Ability to remain free from injury will improve 06/30/2024 0702 by Eliane Vola HERO, RN Outcome: Progressing 06/30/2024 0701 by Eliane Vola HERO, RN Outcome: Progressing   Problem: Skin Integrity: Goal: Risk for impaired skin integrity will decrease 06/30/2024 0702 by Eliane Vola HERO, RN Outcome: Progressing 06/30/2024 0701 by Eliane Vola HERO, RN Outcome: Progressing

## 2024-06-30 NOTE — Progress Notes (Signed)
   06/30/24 1300  TOC Brief Assessment  Insurance and Status Reviewed  Patient has primary care physician Yes  Home environment has been reviewed single family home  Prior level of function: mother is primary caregiver  Prior/Current Home Services No current home services  Social Drivers of Health Review SDOH reviewed no interventions necessary  Readmission risk has been reviewed Yes  Transition of care needs no transition of care needs at this time    Signed: Heather Saltness, MSW, LCSW Clinical Social Worker Inpatient Care Management 06/30/2024 1:01 PM

## 2024-07-02 LAB — URINE CULTURE: Culture: >=100000 — AB

## 2024-07-02 NOTE — Discharge Summary (Signed)
 Physician Discharge Summary   Patient: [name] MRN: [id number] DOB: October 15, 1980  Admit date:     06/28/2024  Discharge date: 06/30/2024  Discharge Physician: Alejandro Marker, DO   PCP: Haze Kingfisher, MD   Recommendations at discharge:   Follow-up with PCP within 1 to 2 weeks repeat CBC, CMP, mag, Phos within 1 week Follow-up with general surgery in outpatient setting  Discharge Diagnoses: Principal Problem:   Ileus (HCC) Active Problems:   S/P VP shunt   Spina bifida (HCC)   Neurogenic bladder  Resolved Problems:   * No resolved hospital problems. *  Hospital Course: [name] is a 43 y.o. male with medical history significant for spina bifida/congenital paraplegia, hydrocephalus-s/p VP shunt, neurogenic bladder with suprapubic pubic catheter, complicated abdominal surgery history s/p hemicolectomy with ileostomy who presented with abdominal distention and less output from his ileostomy site.  The patient reports that his mother noticed that his belly was much more distended.  He denies any pain or discomfort.  His ileostomy is still putting out just less than usual. He does feel dehydrated. Nausea but no vomiting.  The patient's last hospitalization was 8 months ago for the exact same thing.  The patient uses his finger to manually dilate his stoma and that seemed to help resolve the issues last time.  **Interim History General Surgery was consulted and he was given IV fluid hydration.  Ileus is now slowly resolving and he is having more output.  Surgery recommends conservative care and have advance his diet to a clear liquid diet for now. In the interim will change his Suprapubic Catheter.  He further improved and diet was advanced he tolerated this well.  Ileus started resolving and he is medically stable and improved to his baseline.  General surgery felt he could be discharged home with outpatient follow-up.  He is medically stable for discharge at this time will  need follow-up with PCP within 1 to 2 weeks neurosurgery in outpatient setting within 1 to 2 weeks.  Assessment and Plan:  Ileus versus developing bowel obstruction, however bowel obstruction less likely: The patient says he last used his finger to dilate his stoma but it did not have any effect and will do it again. CT Scan Abd/Pelvis done and showed Diffuse distension of the stomach and small bowel through the level of ileostomy, with multiple gas fluid levels identified as above which could reflect developing distal obstruction just proximal to the ileostomy, versus ileus. Please correlate with ileostomy output and No CT findings of bowel ischemia. -General Surgery has been consulted and feel he has an ileus. S/p IV fluids. NGT not necessary. -WBC Trending down and went from 19.5 -> 15.4 -> 13.5 is now 8.2 at the time of discharge. The patient was initially put in the sepsis pathway because of his elevated white blood cell count and elevated lactic acid. Since CT does not show any sign of infection, will not to continue his antibiotics at this time. -Blood Cx x2 showed NGTD @ 4 Days -Repeat KUB done and showed: Persistent dilatation of bowel loops in the lower abdomen.  -General Surgery allowing for CLD now and advance to regular diet he tolerated this well.  He symptoms improved and he is medically stable for discharge at this time will need to follow-up with PCP within 1 to 2 weeks and general surgery outpatient setting   AKI: BUN/Cr Trend: Recent Labs  Lab 06/28/24 1203 06/28/24 1213 06/29/24 0033 06/29/24 0503 06/30/24 9345  BUN 55* 61* 31* 23* 12  CREATININE 0.93 1.30* 0.60* 0.37* 0.42*  -S/p 3.25 Liter bolus and then is also s/p LR @ 150 mL/hr x 20 hours -Avoid Nephrotoxic Medications, Contrast Dyes, Hypotension and Dehydration to Ensure Adequate Renal Perfusion and will need to Renally Adjust Meds. CTM and Trend Renal Function carefully and repeat CMP in the AM   Lactic Acidosis: In  the setting of Dehydration. S/p IVF as above  Seizure Disorder: C/w Levetiracetam  500 mg q12h and change to po when tolerating Diet. Seizure Precautions.   Bacteruria: Urinalysis showed hazy appearance with amber-colored urine, moderate leukocytes, negative nitrites, many bacteria, 6-10 RBCs per high-power field, 21-50 WBCs with urine culture showing:   Abnormal  >=100,000 COLONIES/mL CITROBACTER FREUNDII 20,000 COLONIES/mL PROVIDENCIA RETTGERI CULTURE REINCUBATED FOR BETTER GROWTH  -Clinically do noe suspect Infection but suspect Colonization given Hx of Suprapubic Catheter so will not continue any antibiotics  Decreased UOP and Decreased Ostomy output in the setting of Dehydration : S/p IVF Hydration; Improving. CTM  [name] / Congenital Spina bifida w/ Hydrocephalus in the Lumbar region s/p VP Shunt with Paraplegia from the Mid Abdomen down with resultant Neurogenic Bowel and Bladder: Supportive Care; Has suprapubic Cathter; Changed this Admission   HLD: C/w Rosuvastatin  20 mg po at bedtime  Essential HTN: C/w Metoprolol  Tartrate 50 mg po BID and Verapamil  120 mg po at bedtime. CTM BP per Protocol. Last BP reading was 127/77  Hx of Migraines: C/w Verapamil  120 mg po at bedtime  Hypoalbuminemia: Patient's Albumin  Trend:  Recent Labs  Lab 06/28/24 1203 06/30/24 0654  ALBUMIN  4.0 3.3*  -CTM and Trend and repeat CMP in the AM  Class I Obesity: Complicates overall prognosis and care. Estimated body mass index is 30.7 kg/m as calculated from the following:   Height as of this encounter: 5' (1.524 m).   Weight as of this encounter: 71.3 kg. Weight Loss and Dietary Counseling given  Consultants: General Surgery Procedures performed: As delineated above  Disposition: Home Diet recommendation:  Discharge Diet Orders (From admission, onward)     Start     Ordered   06/30/24 0000  Diet - low sodium heart healthy        06/30/24 1220           Regular  diet DISCHARGE MEDICATION: Allergies as of 06/30/2024       Reactions   Latex Hives   Succinylcholine  Rash, Other (See Comments)   Pt is half [place] - prolonged paralysis due to compromised metabolism. Succinylcholine  given on 07/07/15 at outside hospital and pt experienced paralysis > 3 hours.  Due to being native [place], his mother states that he takes longer to come out of it   Ciprofloxacin Other (See Comments)   IV only-- caused burning in arm    Vancomycin Itching, Swelling   Adhesive [tape] Hives, Rash   Wound Dressing Adhesive Hives, Rash        Medication List     TAKE these medications    acetaminophen  650 MG CR tablet Commonly known as: TYLENOL  Take 1,300 mg by mouth every 8 (eight) hours as needed for pain.   calcium  elemental as carbonate 400 MG chewable tablet Commonly known as: BARIATRIC TUMS ULTRA Chew 800 mg by mouth 3 (three) times daily after meals.   Calcium -Magnesium -Zinc-D3 Tabs Take 1 tablet by mouth in the morning and at bedtime.   Cranberry-Vitamin C -Vitamin E 4200-20-3 MG-MG-UNIT Caps Take 2 capsules by mouth in the morning, at  noon, and at bedtime.   levETIRAcetam  500 MG tablet Commonly known as: KEPPRA  Take 500 mg by mouth 2 (two) times daily.   liver oil-zinc oxide 40 % ointment Commonly known as: DESITIN Apply 1 Application topically as needed for irritation (affected areas).   metoprolol  tartrate 50 MG tablet Commonly known as: LOPRESSOR  Take 50 mg by mouth in the morning and at bedtime.   multivitamin with minerals Tabs tablet Take 1 tablet by mouth daily with lunch.   ondansetron  4 MG tablet Commonly known as: ZOFRAN  Take 1 tablet (4 mg total) by mouth every 8 (eight) hours as needed for nausea or vomiting.   oxybutynin  5 MG tablet Commonly known as: DITROPAN  Take 5 mg by mouth 2 (two) times daily.   polyethylene glycol 17 g packet Commonly known as: MIRALAX  / GLYCOLAX  Take 17 g by mouth daily.   rosuvastatin   20 MG tablet Commonly known as: CRESTOR  Take 20 mg by mouth at bedtime.   verapamil  120 MG tablet Commonly known as: CALAN  Take 120 mg by mouth at bedtime.   vitamin C  1000 MG tablet Take 1,000 mg by mouth daily with lunch.        Discharge Exam: Filed Weights   06/28/24 1136 06/28/24 1945  Weight: 73.5 kg 71.3 kg   Vitals:   06/30/24 0504 06/30/24 1228  BP: 130/71 116/70  Pulse: 66 65  Resp: 18   Temp: 97.6 F (36.4 C) 97.8 F (36.6 C)  SpO2: 98% 100%   Examination: Physical Exam:  Constitutional: WN/WD obese Caucasian male in no acute distress Respiratory: Diminished to auscultation bilaterally, no wheezing, rales, rhonchi or crackles. Normal respiratory effort and patient is not tachypenic. No accessory muscle use.  Unlabored breathing Cardiovascular: RRR, no murmurs / rubs / gallops. S1 and S2 auscultated.  Mild extremity edema but he has atrophied legs in setting of his spina bifida Abdomen: Soft, non-tender, distended secondary body habitus and has an ostomy in place with good output.  Bowel sounds positive.  GU: Deferred. Musculoskeletal: Has some atrophied lower extremities in the setting of his spina bifida Skin: No rashes, lesions, ulcers on limited skin evaluation. No induration; Warm and dry.  Neurologic: CN 2-12 grossly intact with no focal deficits.   Psychiatric: Is awake and alert and oriented  Condition at discharge: stable  The results of significant diagnostics from this hospitalization (including imaging, microbiology, ancillary and laboratory) are listed below for reference.   Imaging Studies: DG Abd 1 View Result Date: 06/30/2024 CLINICAL DATA:  Ileus EXAM: ABDOMEN - 1 VIEW COMPARISON:  06/29/2024 FINDINGS: Similar gas-filled dilated bowel loops in the right lower quadrant measuring up to 6.1 cm today, stable. Bones are diffusely demineralized. Thoracolumbar spinal fusion hardware evident with chronic fracture of the left-sided rod. IMPRESSION:  Similar gas-filled dilated bowel loops in the right lower quadrant. Electronically Signed   By: Camellia Candle M.D.   On: 06/30/2024 07:15   DG Abd 1 View Result Date: 06/29/2024 CLINICAL DATA:  Abdominal distension EXAM: ABDOMEN - 1 VIEW COMPARISON:  Radiograph dated 08/15/2023. FINDINGS: Dilated bowel in the right lower quadrant measure up to 6 cm. No free air. Spinal fusion hardware. IMPRESSION: Persistent dilatation of bowel loops in the lower abdomen. Electronically Signed   By: Vanetta Chou M.D.   On: 06/29/2024 14:11   CT Angio Abd/Pel W and/or Wo Contrast Result Date: 06/28/2024 CLINICAL DATA:  Mesenteric ischemia, acute elevated lactic, multiple abd surgeies, bloating with abdominal pain, leukocytosis, illeostomy with decrease output  EXAM: CTA ABDOMEN AND PELVIS WITHOUT AND WITH CONTRAST TECHNIQUE: Multidetector CT imaging of the abdomen and pelvis was performed using the standard protocol during bolus administration of intravenous contrast. Multiplanar reconstructed images and MIPs were obtained and reviewed to evaluate the vascular anatomy. RADIATION DOSE REDUCTION: This exam was performed according to the departmental dose-optimization program which includes automated exposure control, adjustment of the mA and/or kV according to patient size and/or use of iterative reconstruction technique. CONTRAST:  OMNIPAQUE  IOHEXOL  350 MG/ML SOLN COMPARISON:  11/23/2023 FINDINGS: VASCULAR Aorta: Normal caliber aorta without aneurysm, dissection, vasculitis or significant stenosis. Celiac: Patent without evidence of aneurysm, dissection, vasculitis or significant stenosis. SMA: Patent without evidence of aneurysm, dissection, vasculitis or significant stenosis. Renals: Both renal arteries are patent without evidence of aneurysm, dissection, vasculitis, fibromuscular dysplasia or significant stenosis. IMA: Patent without evidence of aneurysm, dissection, vasculitis or significant stenosis. Inflow:  Patent without evidence of aneurysm, dissection, vasculitis or significant stenosis. Proximal Outflow: Bilateral common femoral and visualized portions of the superficial and profunda femoral arteries are patent without evidence of aneurysm, dissection, vasculitis or significant stenosis. Veins: No obvious venous abnormality within the limitations of this arterial phase study. Review of the MIP images confirms the above findings. NON-VASCULAR Lower chest: No acute pleural or parenchymal lung disease. Hepatobiliary: No focal liver abnormality is seen. Status post cholecystectomy. No biliary dilatation. Pancreas: Unremarkable. No pancreatic ductal dilatation or surrounding inflammatory changes. Spleen: Normal in size without focal abnormality. Adrenals/Urinary Tract: The kidneys enhance normally and symmetrically. No urinary tract calculi. The adrenals are unremarkable. Suprapubic catheter decompresses the urinary bladder, which limits evaluation of the bladder. 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 There is diffuse bowel dilatation extending from the stomach through the ileostomy, with maximal dimension of the jejunum measuring 7 cm in diameter. Multiple gas fluid levels are seen within the stomach and distended small bowel to the level of the ileostomy. Findings are compatible with developing distal obstruction just proximal to the ileostomy versus ileus. Please correlate with ileostomy output. There is no pneumatosis, bowel wall thickening, or inflammatory change to suggest underlying bowel ischemia. The distal colon is decompressed without acute finding. Lymphatic: No pathologic adenopathy. Reproductive: Prostate is unremarkable. Other: No free fluid or free intraperitoneal gas. Wide diastasis of the rectus musculature unchanged. Musculoskeletal: Ventriculostomy catheter terminates in the right upper quadrant anterior abdominal wall. Stable findings of spinal  dysraphism and postsurgical spinal fusion. No acute displaced fractures. Reconstructed images demonstrate no additional findings. IMPRESSION: VASCULAR 1. No evidence of mesenteric ischemia. 2. No acute or significant vascular findings. NON-VASCULAR 1. Diffuse distension of the stomach and small bowel through the level of ileostomy, with multiple gas fluid levels identified as above. Findings could reflect developing distal obstruction just proximal to the ileostomy, versus ileus. Please correlate with ileostomy output. 2. No CT findings of bowel ischemia. 3. Other chronic findings as above. Electronically Signed   By: Ozell Daring M.D.   On: 06/28/2024 16:52   Microbiology: Results for orders placed or performed during the hospital encounter of 06/28/24  Blood culture (routine x 2)     Status: None (Preliminary result)   Collection Time: 06/28/24 12:15 PM   Specimen: BLOOD  Result Value Ref Range Status   Specimen Description   Final    BLOOD RIGHT ANTECUBITAL Performed at [facility], [address] [place], [place] [address]    Special Requests   Final    BOTTLES DRAWN AEROBIC AND ANAEROBIC  Blood Culture adequate volume Performed at [facility], [address], [place], [place] [address]    Culture   Final    NO GROWTH 4 DAYS Performed at [facility], [address] [place], [place] [address]    Report Status PENDING  Incomplete  Urine Culture     Status: Abnormal   Collection Time: 06/28/24  2:12 PM   Specimen: Urine, Clean Catch  Result Value Ref Range Status   Specimen Description   Final    URINE, CLEAN CATCH Performed at [facility], [address], [place], [place] [address]    Special Requests   Final    NONE Performed at [facility], [address], [place], [place] [address]    Culture (A)  Final    >=100,000 COLONIES/mL CITROBACTER FREUNDII 20,000 COLONIES/mL PROVIDENCIA  RETTGERI    Report Status 07/02/2024 FINAL  Final   Organism ID, Bacteria CITROBACTER FREUNDII (A)  Final   Organism ID, Bacteria PROVIDENCIA RETTGERI (A)  Final      Susceptibility   Citrobacter freundii - MIC*    CEFEPIME  <=0.12 SENSITIVE Sensitive     ERTAPENEM <=0.12 SENSITIVE Sensitive     CEFTRIAXONE  <=0.25 SENSITIVE Sensitive     CIPROFLOXACIN <=0.06 SENSITIVE Sensitive     GENTAMICIN  <=1 SENSITIVE Sensitive     NITROFURANTOIN  32 SENSITIVE Sensitive     TRIMETH/SULFA <=20 SENSITIVE Sensitive     PIP/TAZO Value in next row Sensitive      <=4 SENSITIVEThis is a modified FDA-approved test that has been validated and its performance characteristics determined by the reporting laboratory.  This laboratory is certified under the Clinical Laboratory Improvement Amendments CLIA as qualified to perform high complexity clinical laboratory testing.    MEROPENEM  Value in next row Sensitive      <=4 SENSITIVEThis is a modified FDA-approved test that has been validated and its performance characteristics determined by the reporting laboratory.  This laboratory is certified under the Clinical Laboratory Improvement Amendments CLIA as qualified to perform high complexity clinical laboratory testing.    * >=100,000 COLONIES/mL CITROBACTER FREUNDII   Providencia rettgeri - MIC*    AMPICILLIN  Value in next row Sensitive      <=4 SENSITIVEThis is a modified FDA-approved test that has been validated and its performance characteristics determined by the reporting laboratory.  This laboratory is certified under the Clinical Laboratory Improvement Amendments CLIA as qualified to perform high complexity clinical laboratory testing.    CEFEPIME  Value in next row Sensitive      <=4 SENSITIVEThis is a modified FDA-approved test that has been validated and its performance characteristics determined by the reporting laboratory.  This laboratory is certified under the Clinical Laboratory Improvement Amendments CLIA  as qualified to perform high complexity clinical laboratory testing.    ERTAPENEM Value in next row Sensitive      <=4 SENSITIVEThis is a modified FDA-approved test that has been validated and its performance characteristics determined by the reporting laboratory.  This laboratory is certified under the Clinical Laboratory Improvement Amendments CLIA as qualified to perform high complexity clinical laboratory testing.    CEFTRIAXONE  Value in next row Sensitive      <=4 SENSITIVEThis is a modified FDA-approved test that has been validated and its performance characteristics determined by the reporting laboratory.  This laboratory is certified under the Clinical Laboratory Improvement Amendments CLIA as qualified to perform high complexity clinical laboratory testing.    CIPROFLOXACIN Value in next  row Sensitive      <=4 SENSITIVEThis is a modified FDA-approved test that has been validated and its performance characteristics determined by the reporting laboratory.  This laboratory is certified under the Clinical Laboratory Improvement Amendments CLIA as qualified to perform high complexity clinical laboratory testing.    GENTAMICIN  Value in next row Sensitive      <=4 SENSITIVEThis is a modified FDA-approved test that has been validated and its performance characteristics determined by the reporting laboratory.  This laboratory is certified under the Clinical Laboratory Improvement Amendments CLIA as qualified to perform high complexity clinical laboratory testing.    NITROFURANTOIN  Value in next row Intermediate      <=4 SENSITIVEThis is a modified FDA-approved test that has been validated and its performance characteristics determined by the reporting laboratory.  This laboratory is certified under the Clinical Laboratory Improvement Amendments CLIA as qualified to perform high complexity clinical laboratory testing.    TRIMETH/SULFA Value in next row Sensitive      <=4 SENSITIVEThis is a modified  FDA-approved test that has been validated and its performance characteristics determined by the reporting laboratory.  This laboratory is certified under the Clinical Laboratory Improvement Amendments CLIA as qualified to perform high complexity clinical laboratory testing.    AMPICILLIN /SULBACTAM Value in next row Intermediate      <=4 SENSITIVEThis is a modified FDA-approved test that has been validated and its performance characteristics determined by the reporting laboratory.  This laboratory is certified under the Clinical Laboratory Improvement Amendments CLIA as qualified to perform high complexity clinical laboratory testing.    PIP/TAZO Value in next row Sensitive      8 SENSITIVEThis is a modified FDA-approved test that has been validated and its performance characteristics determined by the reporting laboratory.  This laboratory is certified under the Clinical Laboratory Improvement Amendments CLIA as qualified to perform high complexity clinical laboratory testing.    MEROPENEM  Value in next row Sensitive      8 SENSITIVEThis is a modified FDA-approved test that has been validated and its performance characteristics determined by the reporting laboratory.  This laboratory is certified under the Clinical Laboratory Improvement Amendments CLIA as qualified to perform high complexity clinical laboratory testing.    * 20,000 COLONIES/mL PROVIDENCIA RETTGERI  Blood culture (routine x 2)     Status: None (Preliminary result)   Collection Time: 06/28/24  9:06 PM   Specimen: BLOOD RIGHT HAND  Result Value Ref Range Status   Specimen Description   Final    BLOOD RIGHT HAND Performed at [facility], [address] [place], [place] [address]    Special Requests   Final    BOTTLES DRAWN AEROBIC ONLY Blood Culture results may not be optimal due to an inadequate volume of blood received in culture bottles Performed at [facility], [address] [place], [place]  [address]    Culture   Final    NO GROWTH 4 DAYS Performed at [facility], [address], [place], [place] [address]    Report Status PENDING  Incomplete   Labs: CBC: Recent Labs  Lab 06/28/24 1203 06/28/24 1213 06/29/24 0033 06/29/24 0503 06/30/24 0654  WBC 19.5*  --  15.4* 13.5* 8.3  NEUTROABS 15.9*  --   --   --  4.9  HGB 17.3* 19.0* 15.6 15.2 14.1  HCT 55.3* 56.0* 49.5 48.7 46.6  MCV 77.6*  --  80.5 79.8* 81.2  PLT 402*  --  275 279  225   Basic Metabolic Panel: Recent Labs  Lab 06/28/24 1203 06/28/24 1213 06/29/24 0033 06/29/24 0503 06/30/24 0654  NA 133* 133* 134* 135 130*  K 4.2 3.9 4.0 3.7 4.6  CL 87* 91* 93* 94* 90*  CO2 29  --  29 31 27   GLUCOSE 150* 160* 120* 95 79  BUN 55* 61* 31* 23* 12  CREATININE 0.93 1.30* 0.60* 0.37* 0.42*  CALCIUM  9.9  --  9.6 9.6 9.0  MG  --   --  2.0  --  2.0  PHOS  --   --   --   --  3.4   Liver Function Tests: Recent Labs  Lab 06/28/24 1203 06/30/24 0654  AST 32 28  ALT 64* 27  ALKPHOS 102 80  BILITOT 0.6 0.8  PROT 7.9 6.6  ALBUMIN  4.0 3.3*   CBG: No results for input(s): GLUCAP in the last 168 hours.  Discharge time spent: greater than 30 minutes.  Signed: Alejandro Marker, DO [facility] 07/02/2024

## 2024-07-03 LAB — CULTURE, BLOOD (ROUTINE X 2)
Culture: NO GROWTH
Culture: NO GROWTH
Special Requests: ADEQUATE

## 2024-07-11 ENCOUNTER — Other Ambulatory Visit: Payer: Self-pay | Admitting: Urology

## 2024-07-17 NOTE — Progress Notes (Deleted)
 HPI M never smoker followed for OSA, complicated by Nocturnal Hypoxemia, HTN, Arnold Chiari / Spina Bifida, Congenital Paraplegia, Suprapubic Catheter,  VP Shunt, Ileostomy, CKD, Mother helps manage his suprapubic catheter and ileostomy. Split PSG 03/25/24- AHI 41/hr, desat to 59%. Titrated to BiPap 21/17, PS 0 =======================================================================================================  01/16/24- 42 yoM for sleep evaluation courtesy of Dr Rayna Hemming with concern of OSA Medical problem list includes Nocturnal Hypoxemia, HTN, Arnold Chiari / Spina Bifida, Congenital Paraplegia, Suprapubic Catheter,  VP Shunt, Ileostomy, CKD, Mother here.  Discussed the use of AI scribe software for clinical note transcription with the patient, who gave verbal consent to proceed. History of Present Illness   [name] is a 43 year old male who presents with nocturnal hypoxemia. He was referred by Dr. Palawal for evaluation of low oxygen  levels during sleep.  He experiences nocturnal oxygen  desaturation, first identified during hospital stays in January and March, where supplemental oxygen  was required. Desaturation was detected by a machine alarm at night. Mother confirms snoring and probable apneas at night, and there is no family history of sleep apnea. He has not had surgeries involving the nose or throat. A VP shunt is in place, but there are no known heart or lung conditions.  He does not use medications for sleep or alertness and primarily consumes water . Daytime sleepiness occurs only when unwell. He naps for 30 minutes to an hour, sometimes twice daily, usually postprandially.  Mother helps manage his suprapubic catheter and ileostomy.    In-center study needed due to medical complexity.   04/18/24- 42 yoM never smoker followed for OSA, complicated by Nocturnal Hypoxemia, HTN, Arnold Chiari / Spina Bifida, Congenital Paraplegia, Suprapubic Catheter,  VP Shunt, Ileostomy,  CKD, Mother helps manage his suprapubic catheter and ileostomy.  Split PSG 03/25/24- AHI 41/hr, desat to 59%. Titrated to BiPap 21/17, PS 0 For treatment decision Best initial option is Bipap or maybe ASV. He and mother are agreeable to trying BIPAP.  Discussed the use of AI scribe software for clinical note transcription with the patient, who gave verbal consent to proceed.  History of Present Illness   [name] is a 43 year old male who presents with obstructive sleep apnea for follow-up after a sleep study.  A sleep study confirmed severe obstructive sleep apnea with episodes of breathing cessation due to throat collapse. During the study, CPAP was insufficient and a BiPAP machine was needed, which maintained airway patency. He does not use any home care equipment such as oxygen .   He remains wheelchair-bound due to cerebral palsy with paraplegia. Mother is here.   Assessment and Plan:    Obstructive sleep apnea Obstructive sleep apnea confirmed via sleep study. BiPAP recommended for airway patency. Discussed mask fit importance and BiPAP safety. Advised on sleeping positions. - Order BiPAP machine through a home care company. - Ensure proper mask fit for BiPAP. - Coordinate with patient care coordinator for BiPAP delivery.    07/18/24- 42 yoM never smoker followed for OSA, complicated by Nocturnal Hypoxemia, HTN, Arnold Chiari / Spina Bifida, Congenital Paraplegia, Suprapubic Catheter,  VP Shunt, Ileostomy, CKD, Mother helps manage his suprapubic catheter and ileostomy.  Split PSG 03/25/24- AHI 41/hr, desat to 59%. Titrated to BiPap 21/17, PS 0 BiPAP Max Insp 21, Min Exp 17 / [employer] ordered 04/23/24 [facility] October for ileus.       ROS-see HPI   + = positive Constitutional:    weight loss, night sweats, fevers, chills, fatigue, lassitude. HEENT:  headaches, difficulty swallowing, tooth/dental problems, sore throat,       sneezing, itching, ear ache, nasal congestion, post  nasal drip, snoring CV:    chest pain, orthopnea, PND, swelling in lower extremities, anasarca,                                   dizziness, palpitations Resp:   shortness of breath with exertion or at rest.                productive cough,   non-productive cough, coughing up of blood.              change in color of mucus.  wheezing.   Skin:    rash or lesions. GI:  No-   heartburn, indigestion, abdominal pain, nausea, vomiting, diarrhea,                 change in bowel habits, loss of appetite GU: dysuria, change in color of urine, no urgency or frequency.   flank pain. MS:   joint pain, stiffness, decreased range of motion, back pain. Neuro-     nothing unusual Psych:  change in mood or affect.  depression or anxiety.   memory loss.  OBJ- Physical Exam General- Alert, Oriented, Affect-appropriate, Distress- none acute, +Power wheelchair Skin- rash-none, lesions- none, excoriation- none Lymphadenopathy- none Head- atraumatic            Eyes- Gross vision intact, PERRLA, conjunctivae and secretions clear            Ears- Hearing, canals-normal            Nose- Clear, no-Septal dev, mucus, polyps, erosion, perforation             Throat- Mallampati IV , mucosa clear , drainage- none, tonsils- atrophic, +teeth Neck- flexible , trachea midline, no stridor , thyroid  nl, carotid no bruit Chest - symmetrical excursion , unlabored           Heart/CV- RRR , no murmur , no gallop  , no rub, nl s1 s2                           - JVD- none , edema- none, stasis changes- none, varices- none           Lung- clear to P&A, wheeze- none, cough- none , dullness-none, rub- none           Chest wall-  Abd- +ileostomy, +suprapubic catheter Br/ Gen/ Rectal- Not done, not indicated Extrem- +lower extremities atrophic Neuro- grossly intact to observation above waist

## 2024-07-18 ENCOUNTER — Ambulatory Visit: Admitting: Internal Medicine

## 2024-07-18 DIAGNOSIS — G4734 Idiopathic sleep related nonobstructive alveolar hypoventilation: Secondary | ICD-10-CM

## 2024-07-24 NOTE — Patient Instructions (Addendum)
 SURGICAL WAITING ROOM VISITATION  Patients having surgery or a procedure may have no more than 2 support people in the waiting area - these visitors may rotate.    Children under the age of 61 must have an adult with them who is not the patient.  Visitors with respiratory illnesses are discouraged from visiting and should remain at home.  If the patient needs to stay at the hospital during part of their recovery, the visitor guidelines for inpatient rooms apply. Pre-op nurse will coordinate an appropriate time for 1 support person to accompany patient in pre-op.  This support person may not rotate.    Please refer to the [facility] website for the visitor guidelines for Inpatients (after your surgery is over and you are in a regular room).       Your procedure is scheduled on: 08-19-24   Report to [facility] Main Entrance    Report to admitting at       0515  AM   Call this number if you have problems the morning of surgery [phone]   Do not eat food OR DRINK LIQUIDS  :After Midnight.                    If you have questions, please contact your surgeon's office.   FOLLOW ANY ADDITIONAL PRE OP INSTRUCTIONS YOU RECEIVED FROM YOUR SURGEON'S OFFICE!!!     Oral Hygiene is also important to reduce your risk of infection.                                    Remember - BRUSH YOUR TEETH THE MORNING OF SURGERY WITH YOUR REGULAR TOOTHPASTE  DENTURES WILL BE REMOVED PRIOR TO SURGERY PLEASE DO NOT APPLY Poly grip OR ADHESIVES!!!   Do NOT smoke after Midnight   Stop all vitamins and herbal supplements 7 days before surgery.   Take these medicines the morning of surgery with A SIP OF WATER :oxybutin, metoprolol , keppra    DO NOT TAKE ANY ORAL DIABETIC MEDICATIONS DAY OF YOUR SURGERY  Bring CPAP mask and tubing day of surgery.                              You may not have any metal on your body including hair pins, jewelry, and body piercing             Do not wear ,  lotions, powders, /cologne, or deodorant               Men may shave face and neck.   Do not bring valuables to the hospital. [facility] IS NOT             RESPONSIBLE   FOR VALUABLES.   Contacts, glasses, dentures or bridgework may not be worn into surgery.   Bring small overnight bag day of surgery.   DO NOT BRING YOUR HOME MEDICATIONS TO THE HOSPITAL. PHARMACY WILL DISPENSE MEDICATIONS LISTED ON YOUR MEDICATION LIST TO YOU DURING YOUR ADMISSION IN THE HOSPITAL!    Patients discharged on the day of surgery will not be allowed to drive home.  Someone NEEDS to stay with you for the first 24 hours after anesthesia.   Special Instructions: Bring a copy of your healthcare power of attorney and living will documents the day of surgery if you haven't scanned them before.  Please read over the following fact sheets you were given: IF YOU HAVE QUESTIONS ABOUT YOUR PRE-OP INSTRUCTIONS PLEASE CALL [phone].    If you test positive for Covid or have been in contact with anyone that has tested positive in the last 10 days please notify you surgeon.    [place] - Preparing for Surgery Before surgery, you can play an important role.  Because skin is not sterile, your skin needs to be as free of germs as possible.  You can reduce the number of germs on your skin by washing with CHG (chlorahexidine gluconate) soap before surgery.  CHG is an antiseptic cleaner which kills germs and bonds with the skin to continue killing germs even after washing. Please DO NOT use if you have an allergy to CHG or antibacterial soaps.  If your skin becomes reddened/irritated stop using the CHG and inform your nurse when you arrive at Short Stay. Do not shave (including legs and underarms) for at least 48 hours prior to the first CHG shower.  You may shave your face/neck.  Please follow these instructions carefully:  1.  Shower with CHG Soap the night before surgery and the morning of surgery.  2.  If  you choose to wash your hair, wash your hair first as usual with your normal  shampoo.  3.  After you shampoo, rinse your hair and body thoroughly to remove the shampoo.                             4.  Use CHG as you would any other liquid soap.  You can apply chg directly to the skin and wash.  Gently with a scrungie or clean washcloth.  5.  Apply the CHG Soap to your body ONLY FROM THE NECK DOWN.   Do  not use on face/ open                           Wound or open sores. Avoid contact with eyes, ears mouth and genitals (private parts).                       Wash face,  Genitals (private parts) with your normal soap.             6.  Wash thoroughly, paying special attention to the area where your  surgery  will be performed.  7.  Thoroughly rinse your body with warm water  from the neck down.  8.  DO NOT shower/wash with your normal soap after using and rinsing off the CHG Soap.                9.  Pat yourself dry with a clean towel.            10.  Wear clean pajamas.            11.  Place clean sheets on your bed the night of your first shower and do not  sleep with pets. Day of Surgery : Do not apply any CHG, lotions/deodorants the morning of surgery.  Please wear clean clothes to the hospital/surgery center.  FAILURE TO FOLLOW THESE INSTRUCTIONS MAY RESULT IN THE CANCELLATION OF YOUR SURGERY  PATIENT SIGNATURE_________________________________  NURSE SIGNATURE__________________________________  ________________________________________________________________________

## 2024-08-07 ENCOUNTER — Encounter (HOSPITAL_COMMUNITY)
Admission: RE | Admit: 2024-08-07 | Discharge: 2024-08-07 | Disposition: A | Discharge status: Home / Self Care | Source: Ambulatory Visit | Attending: Urology | Admitting: Urology

## 2024-08-07 ENCOUNTER — Other Ambulatory Visit: Payer: Self-pay

## 2024-08-07 ENCOUNTER — Encounter (HOSPITAL_COMMUNITY): Payer: Self-pay

## 2024-08-07 VITALS — BP 121/74 | HR 73 | Temp 98.2°F | Resp 16 | Ht 61.0 in | Wt 162.0 lb

## 2024-08-07 DIAGNOSIS — Z01818 Encounter for other preprocedural examination: Secondary | ICD-10-CM | POA: Insufficient documentation

## 2024-08-07 DIAGNOSIS — R9431 Abnormal electrocardiogram [ECG] [EKG]: Secondary | ICD-10-CM | POA: Insufficient documentation

## 2024-08-07 DIAGNOSIS — I10 Essential (primary) hypertension: Secondary | ICD-10-CM | POA: Insufficient documentation

## 2024-08-07 HISTORY — DX: Sleep apnea, unspecified: G47.30

## 2024-08-07 LAB — BASIC METABOLIC PANEL WITH GFR
Anion gap: 9 (ref 5–15)
BUN: 10 mg/dL (ref 6–20)
CO2: 31 mmol/L (ref 22–32)
Calcium: 9.1 mg/dL (ref 8.9–10.3)
Chloride: 99 mmol/L (ref 98–111)
Creatinine, Ser: 0.42 mg/dL — ABNORMAL LOW (ref 0.61–1.24)
GFR, Estimated: >60 mL/min (ref >60)
Glucose, Bld: 89 mg/dL (ref 70–99)
Potassium: 3.6 mmol/L (ref 3.5–5.1)
Sodium: 139 mmol/L (ref 135–145)

## 2024-08-07 LAB — CBC
HCT: 43.3 % (ref 39.0–52.0)
Hemoglobin: 13.9 g/dL (ref 13.0–17.0)
MCH: 26 pg (ref 26.0–34.0)
MCHC: 32.1 g/dL (ref 30.0–36.0)
MCV: 81.1 fL (ref 80.0–100.0)
Platelets: 247 K/uL (ref 150–400)
RBC: 5.34 MIL/uL (ref 4.22–5.81)
RDW: 13.9 % (ref 11.5–15.5)
WBC: 8 K/uL (ref 4.0–10.5)
nRBC: 0 % (ref 0.0–0.2)

## 2024-08-07 NOTE — Progress Notes (Addendum)
 PCP - Dr. Himanshu Paliwal LOV 07-29-24 Cardiologist - n/a  PPM/ICD -  Device Orders -  Rep Notified -   Chest x-ray -  EKG - preop Stress Test -  ECHO - 2016  Cardiac Cath -   Sleep Study - yes CPAP - yes ? Bipap  Fasting Blood Sugar - n/a Checks Blood Sugar _n/a____ times a day  Blood Thinner Instructions:n/a Aspirin Instructions:n/a  ERAS Protcol - PRE-SURGERY n/a    COVID vaccine -yes  Activity--WC bound no complaints of cp or sob  Anesthesia review: paralysis mid abd. Dowm from spina bifida,   Patient denies shortness of breath, fever, cough and chest pain at PAT appointment   All instructions explained to the patient, with a verbal understanding of the material. Patient agrees to go over the instructions while at home for a better understanding. Patient also instructed to self quarantine after being tested for COVID-19. The opportunity to ask questions was provided.

## 2024-08-18 NOTE — Anesthesia Preprocedure Evaluation (Signed)
 Anesthesia Evaluation  Patient identified by MRN, date of birth, ID band Patient awake    Reviewed: Allergy & Precautions, H&P , NPO status , Patient's Chart, lab work & pertinent test results, reviewed documented beta blocker date and time   History of Anesthesia Complications Negative for: history of anesthetic complications  Airway Mallampati: III  TM Distance: >3 FB Neck ROM: Full    Dental  (+) Poor Dentition, Chipped, Missing   Pulmonary neg pulmonary ROS, sleep apnea    Pulmonary exam normal        Cardiovascular hypertension, Pt. on home beta blockers and Pt. on medications Normal cardiovascular exam     Neuro/Psych  Headaches, Seizures - (none since age 25), Well Controlled,   Spina bifida Paraplegic    negative psych ROS   GI/Hepatic Neg liver ROS,GERD  Controlled,,  Endo/Other  negative endocrine ROS    Renal/GU Renal disease    Neurogenic bladder  negative genitourinary   Musculoskeletal negative musculoskeletal ROS (+)    Abdominal   Peds negative pediatric ROS (+)  Hematology  (+) Blood dyscrasia, anemia   Anesthesia Other Findings Hx prolonged paralysis following succinylcholine    Reproductive/Obstetrics negative OB ROS                              Anesthesia Physical Anesthesia Plan  ASA: 3  Anesthesia Plan: MAC   Post-op Pain Management:    Induction: Intravenous  PONV Risk Score and Plan: 1 and Propofol  infusion and Treatment may vary due to age or medical condition  Airway Management Planned: Natural Airway, Simple Face Mask and Nasal Cannula  Additional Equipment: None  Intra-op Plan:   Post-operative Plan:   Informed Consent: I have reviewed the patients History and Physical, chart, labs and discussed the procedure including the risks, benefits and alternatives for the proposed anesthesia with the patient or authorized representative who has  indicated his/her understanding and acceptance.     Dental advisory given  Plan Discussed with: Anesthesiologist and CRNA  Anesthesia Plan Comments:          Anesthesia Quick Evaluation

## 2024-08-19 ENCOUNTER — Ambulatory Visit (HOSPITAL_COMMUNITY): Admitting: Anesthesiology

## 2024-08-19 ENCOUNTER — Other Ambulatory Visit: Payer: Self-pay

## 2024-08-19 ENCOUNTER — Ambulatory Visit (HOSPITAL_COMMUNITY): Payer: Self-pay | Admitting: Medical

## 2024-08-19 ENCOUNTER — Ambulatory Visit (HOSPITAL_COMMUNITY): Admission: RE | Admit: 2024-08-19 | Discharge: 2024-08-19 | Disposition: A | Attending: Urology | Admitting: Urology

## 2024-08-19 ENCOUNTER — Encounter (HOSPITAL_COMMUNITY)
Admission: RE | Disposition: A | Discharge status: Home / Self Care | Payer: Self-pay | Source: Home / Self Care | Attending: Urology

## 2024-08-19 ENCOUNTER — Encounter (HOSPITAL_COMMUNITY): Payer: Self-pay | Admitting: Urology

## 2024-08-19 DIAGNOSIS — G8220 Paraplegia, unspecified: Secondary | ICD-10-CM | POA: Diagnosis not present

## 2024-08-19 DIAGNOSIS — Q0703 Arnold-Chiari syndrome with spina bifida and hydrocephalus: Secondary | ICD-10-CM | POA: Diagnosis not present

## 2024-08-19 DIAGNOSIS — I129 Hypertensive chronic kidney disease with stage 1 through stage 4 chronic kidney disease, or unspecified chronic kidney disease: Secondary | ICD-10-CM | POA: Diagnosis not present

## 2024-08-19 DIAGNOSIS — N189 Chronic kidney disease, unspecified: Secondary | ICD-10-CM | POA: Diagnosis not present

## 2024-08-19 DIAGNOSIS — R569 Unspecified convulsions: Secondary | ICD-10-CM | POA: Diagnosis not present

## 2024-08-19 DIAGNOSIS — N319 Neuromuscular dysfunction of bladder, unspecified: Secondary | ICD-10-CM | POA: Diagnosis present

## 2024-08-19 DIAGNOSIS — K219 Gastro-esophageal reflux disease without esophagitis: Secondary | ICD-10-CM | POA: Diagnosis not present

## 2024-08-19 DIAGNOSIS — D631 Anemia in chronic kidney disease: Secondary | ICD-10-CM | POA: Diagnosis not present

## 2024-08-19 DIAGNOSIS — I10 Essential (primary) hypertension: Secondary | ICD-10-CM | POA: Diagnosis not present

## 2024-08-19 DIAGNOSIS — Z960 Presence of urogenital implants: Secondary | ICD-10-CM | POA: Diagnosis not present

## 2024-08-19 HISTORY — PX: CYSTOSCOPY WITH INJECTION: SHX1424

## 2024-08-19 SURGERY — CYSTOSCOPY, WITH INJECTION OF BLADDER NECK OR BLADDER WALL
Anesthesia: Monitor Anesthesia Care | Site: Bladder

## 2024-08-19 MED ORDER — FENTANYL CITRATE (PF) 50 MCG/ML IJ SOSY: 25.0000 ug | PREFILLED_SYRINGE | INTRAMUSCULAR | Status: DC | PRN

## 2024-08-19 MED ORDER — ONABOTULINUMTOXINA 100 UNITS IJ SOLR
INTRAMUSCULAR | Status: DC | PRN
  Administered 2024-08-19: 200 [IU]

## 2024-08-19 MED ORDER — LIDOCAINE HCL (PF) 2 % IJ SOLN
INTRAMUSCULAR | Status: AC
  Filled 2024-08-19: qty 5

## 2024-08-19 MED ORDER — PROPOFOL 10 MG/ML IV BOLUS
INTRAVENOUS | Status: DC | PRN
  Administered 2024-08-19: 115 ug/kg/min via INTRAVENOUS
  Administered 2024-08-19: 50 mg via INTRAVENOUS

## 2024-08-19 MED ORDER — ONDANSETRON HCL 4 MG/2ML IJ SOLN
INTRAMUSCULAR | Status: AC
  Filled 2024-08-19: qty 2

## 2024-08-19 MED ORDER — OXYCODONE HCL 5 MG PO TABS: 5.0000 mg | ORAL_TABLET | Freq: Once | ORAL | Status: DC | PRN

## 2024-08-19 MED ORDER — ONDANSETRON HCL 4 MG/2ML IJ SOLN
INTRAMUSCULAR | Status: DC | PRN
  Administered 2024-08-19: 4 mg via INTRAVENOUS

## 2024-08-19 MED ORDER — STERILE WATER FOR IRRIGATION IR SOLN
Status: DC | PRN
  Administered 2024-08-19: 3000 mL

## 2024-08-19 MED ORDER — KETAMINE HCL 50 MG/5ML IJ SOSY
PREFILLED_SYRINGE | INTRAMUSCULAR | Status: AC
  Filled 2024-08-19: qty 5

## 2024-08-19 MED ORDER — LIDOCAINE HCL (PF) 2 % IJ SOLN
INTRAMUSCULAR | Status: DC | PRN
  Administered 2024-08-19: 100 mg via INTRADERMAL

## 2024-08-19 MED ORDER — ONDANSETRON HCL 4 MG/2ML IJ SOLN: 4.0000 mg | Freq: Once | INTRAMUSCULAR | Status: DC | PRN

## 2024-08-19 MED ORDER — ONABOTULINUMTOXINA 100 UNITS IJ SOLR
INTRAMUSCULAR | Status: AC
  Filled 2024-08-19: qty 200

## 2024-08-19 MED ORDER — LACTATED RINGERS IV SOLN: INTRAVENOUS | Status: DC

## 2024-08-19 MED ORDER — GLYCOPYRROLATE 0.2 MG/ML IJ SOLN
INTRAMUSCULAR | Status: DC | PRN
  Administered 2024-08-19: .2 mg via INTRAVENOUS

## 2024-08-19 MED ORDER — MEPERIDINE HCL 25 MG/ML IJ SOLN: 6.2500 mg | INTRAMUSCULAR | Status: DC | PRN

## 2024-08-19 MED ORDER — ACETAMINOPHEN 500 MG PO TABS
1000.0000 mg | ORAL_TABLET | Freq: Once | ORAL | Status: AC
  Administered 2024-08-19: 1000 mg via ORAL
  Filled 2024-08-19: qty 2

## 2024-08-19 MED ORDER — DEXAMETHASONE SOD PHOSPHATE PF 10 MG/ML IJ SOLN
INTRAMUSCULAR | Status: DC | PRN
  Administered 2024-08-19: 10 mg via INTRAVENOUS

## 2024-08-19 MED ORDER — LACTATED RINGERS IV SOLN: INTRAVENOUS | Status: DC | PRN

## 2024-08-19 MED ORDER — SODIUM CHLORIDE (PF) 0.9 % IJ SOLN
INTRAMUSCULAR | Status: AC
  Filled 2024-08-19: qty 20

## 2024-08-19 MED ORDER — PROPOFOL 10 MG/ML IV BOLUS
INTRAVENOUS | Status: AC
  Filled 2024-08-19: qty 20

## 2024-08-19 MED ORDER — ORAL CARE MOUTH RINSE: 15.0000 mL | Freq: Once | OROMUCOSAL | Status: AC

## 2024-08-19 MED ORDER — SODIUM CHLORIDE 0.9 % IV SOLN
1.0000 g | Freq: Once | INTRAVENOUS | Status: AC
  Administered 2024-08-19: 1 g via INTRAVENOUS
  Filled 2024-08-19: qty 20

## 2024-08-19 MED ORDER — PROPOFOL 1000 MG/100ML IV EMUL
INTRAVENOUS | Status: AC
  Filled 2024-08-19: qty 100

## 2024-08-19 MED ORDER — CHLORHEXIDINE GLUCONATE 0.12 % MT SOLN
15.0000 mL | Freq: Once | OROMUCOSAL | Status: AC
  Administered 2024-08-19: 15 mL via OROMUCOSAL

## 2024-08-19 MED ORDER — OXYCODONE HCL 5 MG/5ML PO SOLN: 5.0000 mg | Freq: Once | ORAL | Status: DC | PRN

## 2024-08-19 MED ORDER — KETAMINE HCL 50 MG/5ML IJ SOSY
PREFILLED_SYRINGE | INTRAMUSCULAR | Status: DC | PRN
  Administered 2024-08-19 (×2): 10 mg via INTRAVENOUS

## 2024-08-19 MED ORDER — MIDAZOLAM HCL (PF) 2 MG/2ML IJ SOLN
INTRAMUSCULAR | Status: DC | PRN
  Administered 2024-08-19: 2 mg via INTRAVENOUS

## 2024-08-19 MED ORDER — GLYCOPYRROLATE 0.2 MG/ML IJ SOLN
INTRAMUSCULAR | Status: AC
  Filled 2024-08-19: qty 1

## 2024-08-19 MED ORDER — MIDAZOLAM HCL 2 MG/2ML IJ SOLN
INTRAMUSCULAR | Status: AC
  Filled 2024-08-19: qty 2

## 2024-08-19 SURGICAL SUPPLY — 13 items
BAG URO CATCHER STRL LF (MISCELLANEOUS) ×1 IMPLANT
CATH FOLEY LF 20FR (CATHETERS) IMPLANT
CLOTH BEACON ORANGE TIMEOUT ST (SAFETY) ×1 IMPLANT
GLOVE BIO SURGEON STRL SZ7.5 (GLOVE) ×1 IMPLANT
GOWN STRL REUS W/ TWL XL LVL3 (GOWN DISPOSABLE) ×1 IMPLANT
KIT TURNOVER KIT A (KITS) ×1 IMPLANT
MANIFOLD NEPTUNE II (INSTRUMENTS) ×1 IMPLANT
NDL ASPIRATION 22 (NEEDLE) ×1 IMPLANT
NDL SAFETY ECLIPSE 18X1.5 (NEEDLE) IMPLANT
PACK CYSTO (CUSTOM PROCEDURE TRAY) ×1 IMPLANT
SYR CONTROL 10ML LL (SYRINGE) IMPLANT
TUBING CONNECTING 10 (TUBING) IMPLANT
WATER STERILE IRR 3000ML UROMA (IV SOLUTION) ×1 IMPLANT

## 2024-08-19 NOTE — Anesthesia Procedure Notes (Signed)
 Procedure Name: MAC Date/Time: 08/19/2024 8:00 AM  Performed by: Obadiah Reyes BROCKS, CRNAPre-anesthesia Checklist: Patient identified, Emergency Drugs available, Suction available, Patient being monitored and Timeout performed Patient Re-evaluated:Patient Re-evaluated prior to induction Oxygen  Delivery Method: Simple face mask Preoxygenation: Pre-oxygenation with 100% oxygen  Airway Equipment and Method: Oral airway

## 2024-08-19 NOTE — Anesthesia Postprocedure Evaluation (Signed)
 Anesthesia Post Note  Patient: [name]  Procedure(s) Performed: CYSTOSCOPY, WITH INJECTION OF BLADDER NECK OR BLADDER WALL,suprapubic catheter change (Bladder)     Patient location during evaluation: PACU Anesthesia Type: MAC Level of consciousness: awake and alert Pain management: pain level controlled Vital Signs Assessment: post-procedure vital signs reviewed and stable Respiratory status: spontaneous breathing, nonlabored ventilation, respiratory function stable and patient connected to nasal cannula oxygen  Cardiovascular status: stable and blood pressure returned to baseline Postop Assessment: no apparent nausea or vomiting Anesthetic complications: no   No notable events documented.  Last Vitals:  Vitals:   08/19/24 0900 08/19/24 0910  BP: 114/65 116/70  Pulse: 65 65  Resp: 15 16  Temp: 36.4 C 36.4 C  SpO2: 99% 100%    Last Pain:  Vitals:   08/19/24 0910  TempSrc:   PainSc: 0-No pain                 Qamar Rosman

## 2024-08-19 NOTE — Op Note (Signed)
 Operative Note   Preoperative diagnosis:  1.  Neurogenic bladder   Postoperative diagnosis: 1.  Neurogenic bladder   Procedure(s): 1.  Cystoscopy with 200 units of Botox  2.  Suprapubic tube exchange   Surgeon: Sherwood Edison, MD   Assistants: None   Anesthesia: General   Complications: None immediate   EBL: Normal   Specimens: 1.  None   Drains/Catheters: 1.  20 French suprapubic tube   Intraoperative findings: 1.  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  Bladder mucosa was without any tumors or stones.  Small capacity bladder.   Indication: 43 year old [name] with neurogenic bladder managed with suprapubic tube and intra detrusor Botox  presents for the previously mentioned operation.   Description of procedure:   The patient was identified and consent was obtained.  The patient was taken to the operating room and placed in the supine position.  The patient was placed under general anesthesia.  Perioperative antibiotics were administered.  The patient was placed in dorsal lithotomy.  Patient was prepped and draped in a standard sterile fashion and a timeout was performed.   A rigid cystoscope was advanced into the urethra and into the bladder.  Complete cystoscopy was performed with findings noted above.  There were no stones so I withdrew the scope and advanced the Botox  cystoscope into the bladder and proceeded to inject 200 units of Botox  throughout the detrusor muscle systematically.  There was no significant active bleeding noted.  I placed a Zachary French suprapubic tube in place 10 cc of sterile water  into the catheter balloon.  It was in good position and I withdrew the scope.  Patient tolerated the procedure well and was stable postoperatively.   Plan: Follow-up in 6 months for repeat Botox .  Continue monthly suprapubic tube exchanges.

## 2024-08-19 NOTE — H&P (Signed)
 H&P  Chief Complaint: NGB  History of Present Illness: 43 YO M with NGB managed with SPT and botox  q 6 mo here for botox .  Past Medical History:  Diagnosis Date   Arnold-Chiari malformation (HCC)    Congenital paraplegia (HCC)    MID-ABDOMIN DOWN  S/P SPINA BIFIDA   GERD (gastroesophageal reflux disease)    Heart palpitations    SECONDARY TO VP SHUNT DRAIN   History of kidney stones    History of seizure    Hypertension    Ileostomy in place [facility]) 2014   Migraines followed by neurologist @[facility]    taking verapamil    Neurogenic bladder    INCONTINENT -   Neurogenic bowel    S/P VP shunt    Sleep apnea    bipap   Spina bifida with hydrocephalus, lumbar region (HCC)    CONGENITAL--   VP SHUNT in place--  PARALYZIED MID ABDOMEN DOWN   Urinary incontinence with continuous leakage    Wears glasses    Wheelchair bound    CAN TRASFER SELF WITHOUT BOARD   Past Surgical History:  Procedure Laterality Date   BACK SURGERY  infant   get skin to stretch over his back; spinal bifida   BACK SURGERY  age 49   scoliosis and  removed hump on his back (kyphosis) and put rods in to stabalize back   BOTOX  INJECTION N/A 11/14/2018   Procedure: BOTOX  INJECTION CYSTOSCOPY;  Surgeon: Carolee Sherwood JONETTA DOUGLAS, MD;  Location: [facility];  Service: Urology;  Laterality: N/A;   BOTOX  INJECTION N/A 05/22/2019   Procedure: BOTOX  INJECTION;  Surgeon: Carolee Sherwood JONETTA DOUGLAS, MD;  Location: [facility];  Service: Urology;  Laterality: N/A;   BOTOX  INJECTION N/A 11/13/2019   Procedure: CYSTOSCOPY BOTOX  INJECTION;  Surgeon: Carolee Sherwood JONETTA DOUGLAS, MD;  Location: [facility];  Service: Urology;  Laterality: N/A;   BOTOX  INJECTION N/A 07/01/2020   Procedure: CYSTOSCOPY, BOTOX  INJECTION, BLADDER FULGERATION, SUPRAPUBIC TUBE EXCHANGE;  Surgeon: Devere Lonni Righter, MD;  Location: [facility];  Service: Urology;  Laterality: N/A;   BOTOX  INJECTION N/A 12/14/2020   Procedure: BOTOX  INJECTION 300 UNITS SUPRAPUBIC TUBE CHANGE;  Surgeon: Carolee Sherwood JONETTA DOUGLAS, MD;  Location: [facility];  Service: Urology;  Laterality: N/A;   BOTOX  INJECTION N/A 06/02/2023   Procedure: CYSTOSCOPY BOTOX  INJECTION 200 UNITS;  Surgeon: Carolee Sherwood JONETTA DOUGLAS, MD;  Location: [facility];  Service: Urology;  Laterality: N/A;  1 HR FOR CASE   CHOLECYSTECTOMY N/A 07/06/2013   Procedure: LAPAROSCOPIC CHOLECYSTECTOMY WITH INTRAOPERATIVE CHOLANGIOGRAM;  Surgeon: Redell Faith, DO;  Location: [facility];  Service: General;  Laterality: N/A;   COLON RESECTION N/A 07/08/2013   Procedure: EXPLORATORY LAPAROSCOPY, DIAGNOSTIC LAPAROSCOPY, PARTIAL COLECTOMY, ABDOMINAL WASHOUT, ABDOMINAL WOUND VAC;  Surgeon: Redell Faith, DO;  Location: [facility];  Service: General;  Laterality: N/A;   CYSTOSCOPY WITH INJECTION N/A 02/19/2024   Procedure: CYSTOSCOPY, WITH INJECTION OF BLADDER NECK OR BLADDER WALL;  Surgeon: Carolee Sherwood JONETTA DOUGLAS, MD;  Location: [facility];  Service: Urology;  Laterality: N/A;  200 UNITS BOTOX    CYSTOSCOPY WITH LITHOLAPAXY N/A 05/09/2022   Procedure: CYSTOSCOPY WITH LITHOLAPAXY, BOTOX  INJECTION;  Surgeon: Carolee Sherwood JONETTA DOUGLAS, MD;  Location: [facility];  Service: Urology;  Laterality: N/A;   HIP SURGERY Bilateral AS CHILD   TENDON RELEASE   HOLMIUM LASER APPLICATION N/A 07/01/2020   Procedure: HOLMIUM LASER OF BLADDER STONE;  Surgeon: Devere Lonni Righter, MD;  Location: [facility];  Service: Urology;  Laterality:  N/A;   INCISION AND DRAINAGE OF WOUND N/A 11/04/2013   Procedure: IRRIGATION AND DEBRIDEMENT ABDOMINAL WOUND WITH PLACEMENT OF ACELL/VAC;  Surgeon: Estefana Reichert, DO;  Location: [facility];  Service: Plastics;  Laterality: N/A;   INSERTION OF SUPRAPUBIC CATHETER N/A 12/23/2019   Procedure: FLEXIBLE CYSTOSCOPE WITH FOLEY PLACEMENT OVER WIRE.;  Surgeon: Elisabeth Valli BIRCH, MD;  Location: [facility];  Service: Urology;  Laterality: N/A;   INSERTION OF SUPRAPUBIC CATHETER N/A 07/26/2021   Procedure: EXCHANGE SUPRAPUBIC CATHETER AMD BOTOX  INJECTION 300 UNITS;  Surgeon: Carolee Sherwood BIRCH DOUGLAS, MD;  Location: [facility];  Service: Urology;  Laterality: N/A;   INSERTION OF SUPRAPUBIC CATHETER N/A 06/02/2023   Procedure: SUPRAPUBIC CATHETER CHANGE;  Surgeon: Carolee Sherwood BIRCH DOUGLAS, MD;  Location: [facility];  Service: Urology;  Laterality: N/A;   IR CATHETER TUBE CHANGE  02/15/2019   IR CATHETER TUBE CHANGE  03/01/2019   IR CATHETER TUBE CHANGE  05/06/2019   IR CATHETER TUBE CHANGE  03/06/2020   IR CATHETER TUBE CHANGE  04/16/2020   IR CATHETER TUBE CHANGE  04/29/2020   ORCHIOPEXY Bilateral AS CHILD   UNDESCENDED TESTIS   RIGHT COLECTOMY/  APPENDECTOMY/  ILEOSTOMY  NOV 2014  [facility]   surgical complication with cholecystectomy   UMBILICAL HERNIA REPAIR  child   VENTRICULOPERITONEAL SHUNT  11-12-2018 per pt and mother---LAST REVISION ~ 06/2015   DUE TO ABD WOUND--- SHUNT DRAINS TO HEART (12/23/2015)    Home Medications:  Medications Prior to Admission  Medication Sig Dispense Refill Last Dose/Taking   acetaminophen  (TYLENOL ) 650 MG CR tablet Take 1,300 mg by mouth every 8 (eight) hours as needed for pain.   Taking As Needed   Ascorbic Acid (VITAMIN C ) 1000 MG tablet Take 1,000 mg by mouth daily with lunch.   Past Week   calcium  elemental as carbonate (BARIATRIC TUMS ULTRA) 400 MG chewable tablet Chew 800 mg by mouth 3 (three) times daily after meals.   Past Week   Cranberry-Vitamin C -Vitamin E 4200-20-3 MG-MG-UNIT CAPS Take 2 capsules by mouth in the morning, at noon, and at bedtime.   Past Week   levETIRAcetam  (KEPPRA ) 500 MG tablet Take 500 mg by mouth 2 (two) times daily.   Taking   liver oil-zinc oxide (DESITIN) 40 % ointment Apply 1 Application topically as needed for irritation (affected areas).   Past Week   metoprolol  tartrate (LOPRESSOR ) 50 MG tablet Take 50 mg by mouth in the morning and at bedtime.   Taking   Multiple Minerals-Vitamins (CALCIUM -MAGNESIUM -ZINC-D3) TABS Take 1 tablet by mouth in the morning and at bedtime.   Past Week   Multiple Vitamin (MULTIVITAMIN WITH  MINERALS) TABS tablet Take 1 tablet by mouth daily with lunch.   Past Week   ondansetron  (ZOFRAN ) 4 MG tablet Take 1 tablet (4 mg total) by mouth every 8 (eight) hours as needed for nausea or vomiting. 20 tablet 0 Taking As Needed   oxybutynin  (DITROPAN ) 5 MG tablet Take 5 mg by mouth 2 (two) times daily.   Taking   polyethylene glycol (MIRALAX  / GLYCOLAX ) 17 g packet Take 17 g by mouth daily. 30 each 0 Past Week   rosuvastatin  (CRESTOR ) 20 MG tablet Take 20 mg by mouth at bedtime.   Taking   verapamil  (CALAN ) 120 MG tablet Take 120 mg by mouth at bedtime.   Taking   Allergies:  Allergies  Allergen Reactions   Latex Hives   Succinylcholine  Rash and Other (See Comments)  Pt is half [place] - prolonged paralysis due to compromised metabolism. Succinylcholine  given on 07/07/15 at outside hospital and pt experienced paralysis > 3 hours.   Due to being native american, his mother states that he takes longer to come out of it   Ciprofloxacin Other (See Comments)    IV only-- caused burning in arm    Vancomycin Itching and Swelling   Adhesive [Tape] Hives and Rash   Wound Dressing Adhesive Hives and Rash    Family History  Problem Relation Age of Onset   Diabetes Other    Hypertension Other    Cancer Other    Stroke Other    Social History:  reports that he has never smoked. He has never been exposed to tobacco smoke. He has never used smokeless tobacco. He reports that he does not drink alcohol and does not use drugs.  ROS: A complete review of systems was performed.  All systems are negative except for pertinent findings as noted. ROS   Physical Exam:  Vital signs in last 24 hours: Temp:  [97.8 F (36.6 C)] 97.8 F (36.6 C) (12/08 0529) Pulse Rate:  [66] 66 (12/08 0529) Resp:  [17] 17 (12/08 0529) BP: (124)/(76) 124/76 (12/08 0529) SpO2:  [99 %] 99 % (12/08 0529) Weight:  [73.5 kg] 73.5 kg (12/08 0544) General:  Alert and oriented, No acute distress HEENT:  Normocephalic, atraumatic Neck: No JVD or lymphadenopathy Cardiovascular: Regular rate and rhythm Lungs: Regular rate and effort Abdomen: Soft, nontender, nondistended, no abdominal masses Back: No CVA tenderness Extremities: No edema  Laboratory Data:  No results found for this or any previous visit (from the past 24 hours). No results found for this or any previous visit (from the past 240 hours). Creatinine: No results for input(s): CREATININE in the last 168 hours.  Impression/Assessment:  NGB  Plan:  Proceed with botox .  Sherwood JONETTA Edison, III 08/19/2024, 7:34 AM

## 2024-08-19 NOTE — Transfer of Care (Signed)
 Immediate Anesthesia Transfer of Care Note  Patient: [name]  Procedure(s) Performed: CYSTOSCOPY, WITH INJECTION OF BLADDER NECK OR BLADDER WALL,suprapubic catheter change (Bladder)  Patient Location: PACU  Anesthesia Type:MAC  Level of Consciousness: drowsy and patient cooperative  Airway & Oxygen  Therapy: Patient Spontanous Breathing and Patient connected to face mask oxygen   Post-op Assessment: Report given to RN and Post -op Vital signs reviewed and stable  Post vital signs: Reviewed and stable  Last Vitals:  Vitals Value Taken Time  BP 107/63 08/19/24 08:23  Temp    Pulse 72 08/19/24 08:26  Resp 22 08/19/24 08:26  SpO2 100 % 08/19/24 08:26  Vitals shown include unfiled device data.  Last Pain:  Vitals:   08/19/24 0544  TempSrc:   PainSc: 0-No pain         Complications: No notable events documented.

## 2024-08-20 ENCOUNTER — Encounter (HOSPITAL_COMMUNITY): Payer: Self-pay | Admitting: Urology
